# Patient Record
Sex: Female | Born: 1965
Health system: Southern US, Community
[De-identification: ages and names within clinical notes are randomized; demographics above are authoritative.]

## PROBLEM LIST (undated history)

## (undated) DIAGNOSIS — L0390 Cellulitis, unspecified: Secondary | ICD-10-CM

## (undated) DIAGNOSIS — I10 Essential (primary) hypertension: Secondary | ICD-10-CM

## (undated) DIAGNOSIS — M549 Dorsalgia, unspecified: Secondary | ICD-10-CM

## (undated) DIAGNOSIS — K219 Gastro-esophageal reflux disease without esophagitis: Secondary | ICD-10-CM

## (undated) DIAGNOSIS — E119 Type 2 diabetes mellitus without complications: Secondary | ICD-10-CM

## (undated) HISTORY — PX: THYROID SURGERY: SHX805

## (undated) HISTORY — PX: CHOLECYSTECTOMY: SHX55

## (undated) HISTORY — PX: ORIF ANKLE FRACTURE: SUR919

## (undated) HISTORY — PX: ENDOMETRIAL ABLATION: SHX621

---

## 2010-05-18 ENCOUNTER — Ambulatory Visit: Payer: Self-pay | Admitting: Cardiology

## 2016-01-16 DIAGNOSIS — B372 Candidiasis of skin and nail: Secondary | ICD-10-CM | POA: Diagnosis not present

## 2016-01-16 DIAGNOSIS — R3915 Urgency of urination: Secondary | ICD-10-CM | POA: Diagnosis not present

## 2016-01-16 DIAGNOSIS — N3946 Mixed incontinence: Secondary | ICD-10-CM | POA: Diagnosis not present

## 2016-01-16 DIAGNOSIS — N390 Urinary tract infection, site not specified: Secondary | ICD-10-CM | POA: Diagnosis not present

## 2016-02-02 DIAGNOSIS — Z6841 Body Mass Index (BMI) 40.0 and over, adult: Secondary | ICD-10-CM | POA: Diagnosis not present

## 2016-02-02 DIAGNOSIS — N3000 Acute cystitis without hematuria: Secondary | ICD-10-CM | POA: Diagnosis not present

## 2016-02-02 DIAGNOSIS — E1165 Type 2 diabetes mellitus with hyperglycemia: Secondary | ICD-10-CM | POA: Diagnosis not present

## 2016-02-02 DIAGNOSIS — I10 Essential (primary) hypertension: Secondary | ICD-10-CM | POA: Diagnosis not present

## 2016-05-17 DIAGNOSIS — Z6841 Body Mass Index (BMI) 40.0 and over, adult: Secondary | ICD-10-CM | POA: Diagnosis not present

## 2016-05-17 DIAGNOSIS — Z0000 Encounter for general adult medical examination without abnormal findings: Secondary | ICD-10-CM | POA: Diagnosis not present

## 2016-08-05 ENCOUNTER — Other Ambulatory Visit (HOSPITAL_COMMUNITY): Payer: Self-pay | Admitting: Internal Medicine

## 2016-08-05 DIAGNOSIS — Z1231 Encounter for screening mammogram for malignant neoplasm of breast: Secondary | ICD-10-CM

## 2016-08-23 ENCOUNTER — Ambulatory Visit (HOSPITAL_COMMUNITY): Payer: Self-pay

## 2016-09-06 DIAGNOSIS — Z6841 Body Mass Index (BMI) 40.0 and over, adult: Secondary | ICD-10-CM | POA: Diagnosis not present

## 2016-09-06 DIAGNOSIS — E1165 Type 2 diabetes mellitus with hyperglycemia: Secondary | ICD-10-CM | POA: Diagnosis not present

## 2016-09-06 DIAGNOSIS — I10 Essential (primary) hypertension: Secondary | ICD-10-CM | POA: Diagnosis not present

## 2016-09-13 ENCOUNTER — Ambulatory Visit (HOSPITAL_COMMUNITY)
Admission: RE | Admit: 2016-09-13 | Discharge: 2016-09-13 | Disposition: A | Discharge status: Home / Self Care | Payer: BLUE CROSS/BLUE SHIELD | Source: Ambulatory Visit | Attending: Internal Medicine | Admitting: Internal Medicine

## 2016-09-13 ENCOUNTER — Encounter (HOSPITAL_COMMUNITY): Payer: Self-pay | Admitting: Radiology

## 2016-09-13 DIAGNOSIS — Z1231 Encounter for screening mammogram for malignant neoplasm of breast: Secondary | ICD-10-CM | POA: Diagnosis not present

## 2016-10-30 DIAGNOSIS — E1165 Type 2 diabetes mellitus with hyperglycemia: Secondary | ICD-10-CM | POA: Diagnosis not present

## 2016-10-30 DIAGNOSIS — I10 Essential (primary) hypertension: Secondary | ICD-10-CM | POA: Diagnosis not present

## 2016-11-01 DIAGNOSIS — L02412 Cutaneous abscess of left axilla: Secondary | ICD-10-CM | POA: Diagnosis not present

## 2016-11-01 DIAGNOSIS — Z6841 Body Mass Index (BMI) 40.0 and over, adult: Secondary | ICD-10-CM | POA: Diagnosis not present

## 2016-11-01 DIAGNOSIS — I10 Essential (primary) hypertension: Secondary | ICD-10-CM | POA: Diagnosis not present

## 2016-12-13 DIAGNOSIS — E1165 Type 2 diabetes mellitus with hyperglycemia: Secondary | ICD-10-CM | POA: Diagnosis not present

## 2016-12-13 DIAGNOSIS — K219 Gastro-esophageal reflux disease without esophagitis: Secondary | ICD-10-CM | POA: Diagnosis not present

## 2016-12-13 DIAGNOSIS — I10 Essential (primary) hypertension: Secondary | ICD-10-CM | POA: Diagnosis not present

## 2016-12-13 DIAGNOSIS — Z6841 Body Mass Index (BMI) 40.0 and over, adult: Secondary | ICD-10-CM | POA: Diagnosis not present

## 2017-03-21 DIAGNOSIS — Z6841 Body Mass Index (BMI) 40.0 and over, adult: Secondary | ICD-10-CM | POA: Diagnosis not present

## 2017-03-21 DIAGNOSIS — I10 Essential (primary) hypertension: Secondary | ICD-10-CM | POA: Diagnosis not present

## 2017-03-21 DIAGNOSIS — K219 Gastro-esophageal reflux disease without esophagitis: Secondary | ICD-10-CM | POA: Diagnosis not present

## 2017-03-21 DIAGNOSIS — M545 Low back pain: Secondary | ICD-10-CM | POA: Diagnosis not present

## 2017-03-21 DIAGNOSIS — F411 Generalized anxiety disorder: Secondary | ICD-10-CM | POA: Diagnosis not present

## 2017-03-21 DIAGNOSIS — E1165 Type 2 diabetes mellitus with hyperglycemia: Secondary | ICD-10-CM | POA: Diagnosis not present

## 2017-04-28 DIAGNOSIS — E119 Type 2 diabetes mellitus without complications: Secondary | ICD-10-CM | POA: Diagnosis not present

## 2017-07-15 DIAGNOSIS — E1165 Type 2 diabetes mellitus with hyperglycemia: Secondary | ICD-10-CM | POA: Diagnosis not present

## 2017-07-15 DIAGNOSIS — M545 Low back pain: Secondary | ICD-10-CM | POA: Diagnosis not present

## 2017-07-15 DIAGNOSIS — K219 Gastro-esophageal reflux disease without esophagitis: Secondary | ICD-10-CM | POA: Diagnosis not present

## 2017-07-15 DIAGNOSIS — I10 Essential (primary) hypertension: Secondary | ICD-10-CM | POA: Diagnosis not present

## 2017-11-11 DIAGNOSIS — K219 Gastro-esophageal reflux disease without esophagitis: Secondary | ICD-10-CM | POA: Diagnosis not present

## 2017-11-11 DIAGNOSIS — I10 Essential (primary) hypertension: Secondary | ICD-10-CM | POA: Diagnosis not present

## 2017-11-11 DIAGNOSIS — Z6841 Body Mass Index (BMI) 40.0 and over, adult: Secondary | ICD-10-CM | POA: Diagnosis not present

## 2017-11-11 DIAGNOSIS — M545 Low back pain: Secondary | ICD-10-CM | POA: Diagnosis not present

## 2017-11-11 DIAGNOSIS — E1165 Type 2 diabetes mellitus with hyperglycemia: Secondary | ICD-10-CM | POA: Diagnosis not present

## 2017-12-01 DIAGNOSIS — M25561 Pain in right knee: Secondary | ICD-10-CM | POA: Diagnosis not present

## 2018-03-11 DIAGNOSIS — Z6841 Body Mass Index (BMI) 40.0 and over, adult: Secondary | ICD-10-CM | POA: Diagnosis not present

## 2018-03-11 DIAGNOSIS — Z0000 Encounter for general adult medical examination without abnormal findings: Secondary | ICD-10-CM | POA: Diagnosis not present

## 2018-04-16 DIAGNOSIS — M85851 Other specified disorders of bone density and structure, right thigh: Secondary | ICD-10-CM | POA: Diagnosis not present

## 2018-06-11 DIAGNOSIS — Z6841 Body Mass Index (BMI) 40.0 and over, adult: Secondary | ICD-10-CM | POA: Diagnosis not present

## 2018-06-11 DIAGNOSIS — E7849 Other hyperlipidemia: Secondary | ICD-10-CM | POA: Diagnosis not present

## 2018-06-11 DIAGNOSIS — I10 Essential (primary) hypertension: Secondary | ICD-10-CM | POA: Diagnosis not present

## 2018-06-11 DIAGNOSIS — E1142 Type 2 diabetes mellitus with diabetic polyneuropathy: Secondary | ICD-10-CM | POA: Diagnosis not present

## 2018-06-11 DIAGNOSIS — Z0000 Encounter for general adult medical examination without abnormal findings: Secondary | ICD-10-CM | POA: Diagnosis not present

## 2018-07-23 DIAGNOSIS — R300 Dysuria: Secondary | ICD-10-CM | POA: Diagnosis not present

## 2018-07-23 DIAGNOSIS — R319 Hematuria, unspecified: Secondary | ICD-10-CM | POA: Diagnosis not present

## 2018-07-23 DIAGNOSIS — Z6841 Body Mass Index (BMI) 40.0 and over, adult: Secondary | ICD-10-CM | POA: Diagnosis not present

## 2018-07-23 DIAGNOSIS — N390 Urinary tract infection, site not specified: Secondary | ICD-10-CM | POA: Diagnosis not present

## 2018-09-17 DIAGNOSIS — I10 Essential (primary) hypertension: Secondary | ICD-10-CM | POA: Diagnosis not present

## 2018-09-17 DIAGNOSIS — E1142 Type 2 diabetes mellitus with diabetic polyneuropathy: Secondary | ICD-10-CM | POA: Diagnosis not present

## 2018-09-17 DIAGNOSIS — E7849 Other hyperlipidemia: Secondary | ICD-10-CM | POA: Diagnosis not present

## 2018-09-17 DIAGNOSIS — Z6841 Body Mass Index (BMI) 40.0 and over, adult: Secondary | ICD-10-CM | POA: Diagnosis not present

## 2018-09-23 DIAGNOSIS — R079 Chest pain, unspecified: Secondary | ICD-10-CM | POA: Diagnosis not present

## 2018-12-31 DIAGNOSIS — Z6841 Body Mass Index (BMI) 40.0 and over, adult: Secondary | ICD-10-CM | POA: Diagnosis not present

## 2018-12-31 DIAGNOSIS — F330 Major depressive disorder, recurrent, mild: Secondary | ICD-10-CM | POA: Diagnosis not present

## 2018-12-31 DIAGNOSIS — I10 Essential (primary) hypertension: Secondary | ICD-10-CM | POA: Diagnosis not present

## 2018-12-31 DIAGNOSIS — M545 Low back pain: Secondary | ICD-10-CM | POA: Diagnosis not present

## 2018-12-31 DIAGNOSIS — E1142 Type 2 diabetes mellitus with diabetic polyneuropathy: Secondary | ICD-10-CM | POA: Diagnosis not present

## 2018-12-31 DIAGNOSIS — E7849 Other hyperlipidemia: Secondary | ICD-10-CM | POA: Diagnosis not present

## 2019-01-01 DIAGNOSIS — R079 Chest pain, unspecified: Secondary | ICD-10-CM | POA: Diagnosis not present

## 2019-03-31 DIAGNOSIS — R229 Localized swelling, mass and lump, unspecified: Secondary | ICD-10-CM | POA: Diagnosis not present

## 2019-03-31 DIAGNOSIS — Z6841 Body Mass Index (BMI) 40.0 and over, adult: Secondary | ICD-10-CM | POA: Diagnosis not present

## 2019-03-31 DIAGNOSIS — Z01419 Encounter for gynecological examination (general) (routine) without abnormal findings: Secondary | ICD-10-CM | POA: Diagnosis not present

## 2019-04-06 DIAGNOSIS — M545 Low back pain: Secondary | ICD-10-CM | POA: Diagnosis not present

## 2019-04-06 DIAGNOSIS — E7849 Other hyperlipidemia: Secondary | ICD-10-CM | POA: Diagnosis not present

## 2019-04-06 DIAGNOSIS — F330 Major depressive disorder, recurrent, mild: Secondary | ICD-10-CM | POA: Diagnosis not present

## 2019-04-06 DIAGNOSIS — I10 Essential (primary) hypertension: Secondary | ICD-10-CM | POA: Diagnosis not present

## 2019-04-06 DIAGNOSIS — E1142 Type 2 diabetes mellitus with diabetic polyneuropathy: Secondary | ICD-10-CM | POA: Diagnosis not present

## 2019-04-21 ENCOUNTER — Other Ambulatory Visit (HOSPITAL_COMMUNITY): Payer: Self-pay | Admitting: Internal Medicine

## 2019-04-21 DIAGNOSIS — Z1231 Encounter for screening mammogram for malignant neoplasm of breast: Secondary | ICD-10-CM

## 2019-04-27 ENCOUNTER — Encounter (INDEPENDENT_AMBULATORY_CARE_PROVIDER_SITE_OTHER): Payer: Self-pay | Admitting: *Deleted

## 2019-04-29 ENCOUNTER — Ambulatory Visit (HOSPITAL_COMMUNITY)
Admission: RE | Admit: 2019-04-29 | Discharge: 2019-04-29 | Disposition: A | Discharge status: Home / Self Care | Payer: BC Managed Care – PPO | Source: Ambulatory Visit | Attending: Internal Medicine | Admitting: Internal Medicine

## 2019-04-29 ENCOUNTER — Encounter (HOSPITAL_COMMUNITY): Payer: Self-pay

## 2019-04-29 ENCOUNTER — Other Ambulatory Visit: Payer: Self-pay

## 2019-04-29 DIAGNOSIS — Z1231 Encounter for screening mammogram for malignant neoplasm of breast: Secondary | ICD-10-CM | POA: Diagnosis not present

## 2019-07-12 DIAGNOSIS — L0291 Cutaneous abscess, unspecified: Secondary | ICD-10-CM | POA: Diagnosis not present

## 2019-07-12 DIAGNOSIS — Z0000 Encounter for general adult medical examination without abnormal findings: Secondary | ICD-10-CM | POA: Diagnosis not present

## 2019-07-12 DIAGNOSIS — Z6841 Body Mass Index (BMI) 40.0 and over, adult: Secondary | ICD-10-CM | POA: Diagnosis not present

## 2019-09-16 DIAGNOSIS — R2231 Localized swelling, mass and lump, right upper limb: Secondary | ICD-10-CM | POA: Diagnosis not present

## 2019-09-16 DIAGNOSIS — Z6841 Body Mass Index (BMI) 40.0 and over, adult: Secondary | ICD-10-CM | POA: Diagnosis not present

## 2019-09-27 DIAGNOSIS — L03314 Cellulitis of groin: Secondary | ICD-10-CM | POA: Diagnosis not present

## 2019-09-27 DIAGNOSIS — R1900 Intra-abdominal and pelvic swelling, mass and lump, unspecified site: Secondary | ICD-10-CM | POA: Diagnosis not present

## 2019-09-28 DIAGNOSIS — F329 Major depressive disorder, single episode, unspecified: Secondary | ICD-10-CM | POA: Diagnosis not present

## 2019-09-28 DIAGNOSIS — I10 Essential (primary) hypertension: Secondary | ICD-10-CM | POA: Diagnosis not present

## 2019-09-28 DIAGNOSIS — F419 Anxiety disorder, unspecified: Secondary | ICD-10-CM | POA: Diagnosis not present

## 2019-09-28 DIAGNOSIS — R7982 Elevated C-reactive protein (CRP): Secondary | ICD-10-CM | POA: Diagnosis not present

## 2019-09-28 DIAGNOSIS — R1900 Intra-abdominal and pelvic swelling, mass and lump, unspecified site: Secondary | ICD-10-CM | POA: Diagnosis not present

## 2019-09-28 DIAGNOSIS — R509 Fever, unspecified: Secondary | ICD-10-CM | POA: Diagnosis not present

## 2019-09-28 DIAGNOSIS — L0390 Cellulitis, unspecified: Secondary | ICD-10-CM | POA: Diagnosis not present

## 2019-09-28 DIAGNOSIS — Z7984 Long term (current) use of oral hypoglycemic drugs: Secondary | ICD-10-CM | POA: Diagnosis not present

## 2019-09-28 DIAGNOSIS — L89899 Pressure ulcer of other site, unspecified stage: Secondary | ICD-10-CM | POA: Diagnosis not present

## 2019-09-28 DIAGNOSIS — R000 Tachycardia, unspecified: Secondary | ICD-10-CM | POA: Diagnosis not present

## 2019-09-28 DIAGNOSIS — R1909 Other intra-abdominal and pelvic swelling, mass and lump: Secondary | ICD-10-CM | POA: Diagnosis not present

## 2019-09-28 DIAGNOSIS — Z9049 Acquired absence of other specified parts of digestive tract: Secondary | ICD-10-CM | POA: Diagnosis not present

## 2019-09-28 DIAGNOSIS — Z6841 Body Mass Index (BMI) 40.0 and over, adult: Secondary | ICD-10-CM | POA: Diagnosis not present

## 2019-09-28 DIAGNOSIS — K219 Gastro-esophageal reflux disease without esophagitis: Secondary | ICD-10-CM | POA: Diagnosis not present

## 2019-09-28 DIAGNOSIS — A419 Sepsis, unspecified organism: Secondary | ICD-10-CM | POA: Diagnosis not present

## 2019-09-28 DIAGNOSIS — L89159 Pressure ulcer of sacral region, unspecified stage: Secondary | ICD-10-CM | POA: Diagnosis not present

## 2019-09-28 DIAGNOSIS — Z79899 Other long term (current) drug therapy: Secondary | ICD-10-CM | POA: Diagnosis not present

## 2019-09-28 DIAGNOSIS — E1165 Type 2 diabetes mellitus with hyperglycemia: Secondary | ICD-10-CM | POA: Diagnosis not present

## 2019-09-28 DIAGNOSIS — E11628 Type 2 diabetes mellitus with other skin complications: Secondary | ICD-10-CM | POA: Diagnosis not present

## 2019-09-28 DIAGNOSIS — E119 Type 2 diabetes mellitus without complications: Secondary | ICD-10-CM | POA: Diagnosis not present

## 2019-09-28 DIAGNOSIS — L03115 Cellulitis of right lower limb: Secondary | ICD-10-CM | POA: Diagnosis not present

## 2019-09-28 DIAGNOSIS — L03314 Cellulitis of groin: Secondary | ICD-10-CM | POA: Diagnosis not present

## 2019-09-28 DIAGNOSIS — R600 Localized edema: Secondary | ICD-10-CM | POA: Diagnosis not present

## 2019-09-28 DIAGNOSIS — E785 Hyperlipidemia, unspecified: Secondary | ICD-10-CM | POA: Diagnosis not present

## 2019-10-06 DIAGNOSIS — R2231 Localized swelling, mass and lump, right upper limb: Secondary | ICD-10-CM | POA: Diagnosis not present

## 2019-10-06 DIAGNOSIS — Z6841 Body Mass Index (BMI) 40.0 and over, adult: Secondary | ICD-10-CM | POA: Diagnosis not present

## 2019-10-06 DIAGNOSIS — L0390 Cellulitis, unspecified: Secondary | ICD-10-CM | POA: Diagnosis not present

## 2019-10-07 DIAGNOSIS — E65 Localized adiposity: Secondary | ICD-10-CM | POA: Diagnosis not present

## 2019-10-07 DIAGNOSIS — L0390 Cellulitis, unspecified: Secondary | ICD-10-CM | POA: Diagnosis not present

## 2019-11-02 DIAGNOSIS — M793 Panniculitis, unspecified: Secondary | ICD-10-CM | POA: Diagnosis not present

## 2019-11-02 DIAGNOSIS — Z6841 Body Mass Index (BMI) 40.0 and over, adult: Secondary | ICD-10-CM | POA: Diagnosis not present

## 2019-11-05 DIAGNOSIS — M793 Panniculitis, unspecified: Secondary | ICD-10-CM | POA: Diagnosis not present

## 2019-11-08 DIAGNOSIS — M793 Panniculitis, unspecified: Secondary | ICD-10-CM | POA: Diagnosis not present

## 2019-11-15 DIAGNOSIS — M793 Panniculitis, unspecified: Secondary | ICD-10-CM | POA: Diagnosis not present

## 2019-11-22 DIAGNOSIS — M793 Panniculitis, unspecified: Secondary | ICD-10-CM | POA: Diagnosis not present

## 2019-12-14 DIAGNOSIS — M793 Panniculitis, unspecified: Secondary | ICD-10-CM | POA: Diagnosis not present

## 2019-12-21 DIAGNOSIS — L989 Disorder of the skin and subcutaneous tissue, unspecified: Secondary | ICD-10-CM | POA: Diagnosis not present

## 2019-12-21 DIAGNOSIS — D235 Other benign neoplasm of skin of trunk: Secondary | ICD-10-CM | POA: Diagnosis not present

## 2019-12-23 DIAGNOSIS — Z6841 Body Mass Index (BMI) 40.0 and over, adult: Secondary | ICD-10-CM | POA: Diagnosis not present

## 2019-12-23 DIAGNOSIS — M793 Panniculitis, unspecified: Secondary | ICD-10-CM | POA: Diagnosis not present

## 2020-01-05 DIAGNOSIS — M793 Panniculitis, unspecified: Secondary | ICD-10-CM | POA: Diagnosis not present

## 2020-01-31 DIAGNOSIS — M793 Panniculitis, unspecified: Secondary | ICD-10-CM | POA: Diagnosis not present

## 2020-02-01 DIAGNOSIS — M793 Panniculitis, unspecified: Secondary | ICD-10-CM | POA: Diagnosis not present

## 2020-02-03 DIAGNOSIS — M793 Panniculitis, unspecified: Secondary | ICD-10-CM | POA: Diagnosis not present

## 2020-02-28 DIAGNOSIS — M793 Panniculitis, unspecified: Secondary | ICD-10-CM | POA: Diagnosis not present

## 2020-03-31 DIAGNOSIS — Z79899 Other long term (current) drug therapy: Secondary | ICD-10-CM | POA: Diagnosis not present

## 2020-03-31 DIAGNOSIS — E119 Type 2 diabetes mellitus without complications: Secondary | ICD-10-CM | POA: Diagnosis not present

## 2020-03-31 DIAGNOSIS — Z9049 Acquired absence of other specified parts of digestive tract: Secondary | ICD-10-CM | POA: Diagnosis not present

## 2020-03-31 DIAGNOSIS — K219 Gastro-esophageal reflux disease without esophagitis: Secondary | ICD-10-CM | POA: Diagnosis not present

## 2020-03-31 DIAGNOSIS — E785 Hyperlipidemia, unspecified: Secondary | ICD-10-CM | POA: Diagnosis not present

## 2020-03-31 DIAGNOSIS — Z7984 Long term (current) use of oral hypoglycemic drugs: Secondary | ICD-10-CM | POA: Diagnosis not present

## 2020-03-31 DIAGNOSIS — F419 Anxiety disorder, unspecified: Secondary | ICD-10-CM | POA: Diagnosis not present

## 2020-03-31 DIAGNOSIS — L89899 Pressure ulcer of other site, unspecified stage: Secondary | ICD-10-CM | POA: Diagnosis not present

## 2020-03-31 DIAGNOSIS — F329 Major depressive disorder, single episode, unspecified: Secondary | ICD-10-CM | POA: Diagnosis not present

## 2020-04-01 DIAGNOSIS — M25562 Pain in left knee: Secondary | ICD-10-CM | POA: Diagnosis not present

## 2020-04-04 DIAGNOSIS — I10 Essential (primary) hypertension: Secondary | ICD-10-CM | POA: Diagnosis not present

## 2020-04-04 DIAGNOSIS — E1165 Type 2 diabetes mellitus with hyperglycemia: Secondary | ICD-10-CM | POA: Diagnosis not present

## 2020-04-04 DIAGNOSIS — E7849 Other hyperlipidemia: Secondary | ICD-10-CM | POA: Diagnosis not present

## 2020-04-04 DIAGNOSIS — E119 Type 2 diabetes mellitus without complications: Secondary | ICD-10-CM | POA: Diagnosis not present

## 2020-04-04 DIAGNOSIS — M793 Panniculitis, unspecified: Secondary | ICD-10-CM | POA: Diagnosis not present

## 2020-04-07 DIAGNOSIS — M793 Panniculitis, unspecified: Secondary | ICD-10-CM | POA: Diagnosis not present

## 2020-04-22 DIAGNOSIS — R0602 Shortness of breath: Secondary | ICD-10-CM | POA: Diagnosis not present

## 2020-04-22 DIAGNOSIS — R079 Chest pain, unspecified: Secondary | ICD-10-CM | POA: Diagnosis not present

## 2020-04-22 DIAGNOSIS — F419 Anxiety disorder, unspecified: Secondary | ICD-10-CM | POA: Diagnosis not present

## 2020-04-22 DIAGNOSIS — R0789 Other chest pain: Secondary | ICD-10-CM | POA: Diagnosis not present

## 2020-05-02 DIAGNOSIS — J029 Acute pharyngitis, unspecified: Secondary | ICD-10-CM | POA: Diagnosis not present

## 2020-05-02 DIAGNOSIS — Z6841 Body Mass Index (BMI) 40.0 and over, adult: Secondary | ICD-10-CM | POA: Diagnosis not present

## 2020-07-05 DIAGNOSIS — E1143 Type 2 diabetes mellitus with diabetic autonomic (poly)neuropathy: Secondary | ICD-10-CM | POA: Diagnosis not present

## 2020-07-05 DIAGNOSIS — M793 Panniculitis, unspecified: Secondary | ICD-10-CM | POA: Diagnosis not present

## 2020-07-05 DIAGNOSIS — I10 Essential (primary) hypertension: Secondary | ICD-10-CM | POA: Diagnosis not present

## 2020-07-05 DIAGNOSIS — E0843 Diabetes mellitus due to underlying condition with diabetic autonomic (poly)neuropathy: Secondary | ICD-10-CM | POA: Diagnosis not present

## 2020-07-05 DIAGNOSIS — E7849 Other hyperlipidemia: Secondary | ICD-10-CM | POA: Diagnosis not present

## 2020-07-07 DIAGNOSIS — F419 Anxiety disorder, unspecified: Secondary | ICD-10-CM | POA: Diagnosis not present

## 2020-07-07 DIAGNOSIS — M793 Panniculitis, unspecified: Secondary | ICD-10-CM | POA: Diagnosis not present

## 2020-07-07 DIAGNOSIS — Z7984 Long term (current) use of oral hypoglycemic drugs: Secondary | ICD-10-CM | POA: Diagnosis not present

## 2020-07-07 DIAGNOSIS — Z9049 Acquired absence of other specified parts of digestive tract: Secondary | ICD-10-CM | POA: Diagnosis not present

## 2020-07-07 DIAGNOSIS — E119 Type 2 diabetes mellitus without complications: Secondary | ICD-10-CM | POA: Diagnosis not present

## 2020-07-07 DIAGNOSIS — Z792 Long term (current) use of antibiotics: Secondary | ICD-10-CM | POA: Diagnosis not present

## 2020-07-07 DIAGNOSIS — L89899 Pressure ulcer of other site, unspecified stage: Secondary | ICD-10-CM | POA: Diagnosis not present

## 2020-07-11 DIAGNOSIS — M793 Panniculitis, unspecified: Secondary | ICD-10-CM | POA: Diagnosis not present

## 2020-07-12 ENCOUNTER — Other Ambulatory Visit: Payer: Self-pay | Admitting: Internal Medicine

## 2020-07-13 ENCOUNTER — Other Ambulatory Visit: Payer: Self-pay | Admitting: Internal Medicine

## 2020-07-13 DIAGNOSIS — M793 Panniculitis, unspecified: Secondary | ICD-10-CM

## 2020-07-28 DIAGNOSIS — L89899 Pressure ulcer of other site, unspecified stage: Secondary | ICD-10-CM | POA: Diagnosis not present

## 2020-07-28 DIAGNOSIS — M793 Panniculitis, unspecified: Secondary | ICD-10-CM | POA: Diagnosis not present

## 2020-07-31 ENCOUNTER — Ambulatory Visit
Admission: RE | Admit: 2020-07-31 | Discharge: 2020-07-31 | Disposition: A | Discharge status: Home / Self Care | Payer: BC Managed Care – PPO | Source: Ambulatory Visit | Attending: Internal Medicine | Admitting: Internal Medicine

## 2020-07-31 ENCOUNTER — Other Ambulatory Visit: Payer: Self-pay

## 2020-07-31 DIAGNOSIS — M793 Panniculitis, unspecified: Secondary | ICD-10-CM

## 2020-07-31 DIAGNOSIS — D1723 Benign lipomatous neoplasm of skin and subcutaneous tissue of right leg: Secondary | ICD-10-CM | POA: Diagnosis not present

## 2020-07-31 DIAGNOSIS — R2241 Localized swelling, mass and lump, right lower limb: Secondary | ICD-10-CM | POA: Diagnosis not present

## 2020-08-08 DIAGNOSIS — Z6841 Body Mass Index (BMI) 40.0 and over, adult: Secondary | ICD-10-CM | POA: Diagnosis not present

## 2020-08-08 DIAGNOSIS — L0390 Cellulitis, unspecified: Secondary | ICD-10-CM | POA: Diagnosis not present

## 2020-08-18 DIAGNOSIS — Z9049 Acquired absence of other specified parts of digestive tract: Secondary | ICD-10-CM | POA: Diagnosis not present

## 2020-08-18 DIAGNOSIS — Z7984 Long term (current) use of oral hypoglycemic drugs: Secondary | ICD-10-CM | POA: Diagnosis not present

## 2020-08-18 DIAGNOSIS — E119 Type 2 diabetes mellitus without complications: Secondary | ICD-10-CM | POA: Diagnosis not present

## 2020-08-18 DIAGNOSIS — Z792 Long term (current) use of antibiotics: Secondary | ICD-10-CM | POA: Diagnosis not present

## 2020-08-18 DIAGNOSIS — L89892 Pressure ulcer of other site, stage 2: Secondary | ICD-10-CM | POA: Diagnosis not present

## 2020-09-07 DIAGNOSIS — M793 Panniculitis, unspecified: Secondary | ICD-10-CM | POA: Diagnosis not present

## 2020-09-22 DIAGNOSIS — L89899 Pressure ulcer of other site, unspecified stage: Secondary | ICD-10-CM | POA: Diagnosis not present

## 2020-09-22 DIAGNOSIS — M793 Panniculitis, unspecified: Secondary | ICD-10-CM | POA: Diagnosis not present

## 2020-10-05 DIAGNOSIS — E7849 Other hyperlipidemia: Secondary | ICD-10-CM | POA: Diagnosis not present

## 2020-10-05 DIAGNOSIS — L0390 Cellulitis, unspecified: Secondary | ICD-10-CM | POA: Diagnosis not present

## 2020-10-05 DIAGNOSIS — I10 Essential (primary) hypertension: Secondary | ICD-10-CM | POA: Diagnosis not present

## 2020-10-05 DIAGNOSIS — E1143 Type 2 diabetes mellitus with diabetic autonomic (poly)neuropathy: Secondary | ICD-10-CM | POA: Diagnosis not present

## 2020-10-20 DIAGNOSIS — M793 Panniculitis, unspecified: Secondary | ICD-10-CM | POA: Diagnosis not present

## 2020-10-20 DIAGNOSIS — L89892 Pressure ulcer of other site, stage 2: Secondary | ICD-10-CM | POA: Diagnosis not present

## 2020-10-30 DIAGNOSIS — M793 Panniculitis, unspecified: Secondary | ICD-10-CM | POA: Diagnosis not present

## 2020-10-30 DIAGNOSIS — S31109A Unspecified open wound of abdominal wall, unspecified quadrant without penetration into peritoneal cavity, initial encounter: Secondary | ICD-10-CM | POA: Diagnosis not present

## 2020-11-10 DIAGNOSIS — L89892 Pressure ulcer of other site, stage 2: Secondary | ICD-10-CM | POA: Diagnosis not present

## 2020-11-10 DIAGNOSIS — S31109A Unspecified open wound of abdominal wall, unspecified quadrant without penetration into peritoneal cavity, initial encounter: Secondary | ICD-10-CM | POA: Diagnosis not present

## 2020-11-10 DIAGNOSIS — M793 Panniculitis, unspecified: Secondary | ICD-10-CM | POA: Diagnosis not present

## 2020-12-07 DIAGNOSIS — M79672 Pain in left foot: Secondary | ICD-10-CM | POA: Diagnosis not present

## 2020-12-07 DIAGNOSIS — M722 Plantar fascial fibromatosis: Secondary | ICD-10-CM | POA: Diagnosis not present

## 2020-12-07 DIAGNOSIS — M7732 Calcaneal spur, left foot: Secondary | ICD-10-CM | POA: Diagnosis not present

## 2020-12-07 DIAGNOSIS — M25579 Pain in unspecified ankle and joints of unspecified foot: Secondary | ICD-10-CM | POA: Diagnosis not present

## 2020-12-26 DIAGNOSIS — L03115 Cellulitis of right lower limb: Secondary | ICD-10-CM | POA: Diagnosis not present

## 2020-12-26 DIAGNOSIS — L89892 Pressure ulcer of other site, stage 2: Secondary | ICD-10-CM | POA: Diagnosis not present

## 2020-12-28 DIAGNOSIS — S31109A Unspecified open wound of abdominal wall, unspecified quadrant without penetration into peritoneal cavity, initial encounter: Secondary | ICD-10-CM | POA: Diagnosis not present

## 2021-01-01 DIAGNOSIS — M793 Panniculitis, unspecified: Secondary | ICD-10-CM | POA: Diagnosis not present

## 2021-01-01 DIAGNOSIS — Z6841 Body Mass Index (BMI) 40.0 and over, adult: Secondary | ICD-10-CM | POA: Diagnosis not present

## 2021-01-04 DIAGNOSIS — M79672 Pain in left foot: Secondary | ICD-10-CM | POA: Diagnosis not present

## 2021-01-04 DIAGNOSIS — M722 Plantar fascial fibromatosis: Secondary | ICD-10-CM | POA: Diagnosis not present

## 2021-01-04 DIAGNOSIS — M79671 Pain in right foot: Secondary | ICD-10-CM | POA: Diagnosis not present

## 2021-01-09 DIAGNOSIS — L89892 Pressure ulcer of other site, stage 2: Secondary | ICD-10-CM | POA: Diagnosis not present

## 2021-01-09 DIAGNOSIS — L03115 Cellulitis of right lower limb: Secondary | ICD-10-CM | POA: Diagnosis not present

## 2021-01-09 DIAGNOSIS — M793 Panniculitis, unspecified: Secondary | ICD-10-CM | POA: Diagnosis not present

## 2021-01-09 DIAGNOSIS — M19071 Primary osteoarthritis, right ankle and foot: Secondary | ICD-10-CM | POA: Diagnosis not present

## 2021-01-09 DIAGNOSIS — M7989 Other specified soft tissue disorders: Secondary | ICD-10-CM | POA: Diagnosis not present

## 2021-01-09 DIAGNOSIS — M7731 Calcaneal spur, right foot: Secondary | ICD-10-CM | POA: Diagnosis not present

## 2021-01-10 DIAGNOSIS — E1143 Type 2 diabetes mellitus with diabetic autonomic (poly)neuropathy: Secondary | ICD-10-CM | POA: Diagnosis not present

## 2021-01-10 DIAGNOSIS — Z6841 Body Mass Index (BMI) 40.0 and over, adult: Secondary | ICD-10-CM | POA: Diagnosis not present

## 2021-01-10 DIAGNOSIS — Z0000 Encounter for general adult medical examination without abnormal findings: Secondary | ICD-10-CM | POA: Diagnosis not present

## 2021-01-10 DIAGNOSIS — L0390 Cellulitis, unspecified: Secondary | ICD-10-CM | POA: Diagnosis not present

## 2021-01-10 DIAGNOSIS — E7849 Other hyperlipidemia: Secondary | ICD-10-CM | POA: Diagnosis not present

## 2021-01-10 DIAGNOSIS — I10 Essential (primary) hypertension: Secondary | ICD-10-CM | POA: Diagnosis not present

## 2021-01-11 DIAGNOSIS — L97811 Non-pressure chronic ulcer of other part of right lower leg limited to breakdown of skin: Secondary | ICD-10-CM | POA: Diagnosis not present

## 2021-01-11 DIAGNOSIS — S31109A Unspecified open wound of abdominal wall, unspecified quadrant without penetration into peritoneal cavity, initial encounter: Secondary | ICD-10-CM | POA: Diagnosis not present

## 2021-01-19 DIAGNOSIS — S31109A Unspecified open wound of abdominal wall, unspecified quadrant without penetration into peritoneal cavity, initial encounter: Secondary | ICD-10-CM | POA: Diagnosis not present

## 2021-01-19 DIAGNOSIS — L97211 Non-pressure chronic ulcer of right calf limited to breakdown of skin: Secondary | ICD-10-CM | POA: Diagnosis not present

## 2021-01-25 ENCOUNTER — Other Ambulatory Visit: Payer: Self-pay

## 2021-01-25 ENCOUNTER — Encounter (HOSPITAL_COMMUNITY): Payer: Self-pay | Admitting: *Deleted

## 2021-01-25 ENCOUNTER — Inpatient Hospital Stay (HOSPITAL_COMMUNITY)
Admission: EM | Admit: 2021-01-25 | Discharge: 2021-01-28 | DRG: 638 | Disposition: A | Discharge status: Home Health | Payer: BC Managed Care – PPO | Attending: Internal Medicine | Admitting: Internal Medicine

## 2021-01-25 ENCOUNTER — Emergency Department (HOSPITAL_COMMUNITY): Payer: BC Managed Care – PPO

## 2021-01-25 DIAGNOSIS — E1165 Type 2 diabetes mellitus with hyperglycemia: Secondary | ICD-10-CM | POA: Diagnosis not present

## 2021-01-25 DIAGNOSIS — I872 Venous insufficiency (chronic) (peripheral): Secondary | ICD-10-CM | POA: Diagnosis present

## 2021-01-25 DIAGNOSIS — M869 Osteomyelitis, unspecified: Secondary | ICD-10-CM

## 2021-01-25 DIAGNOSIS — T148XXA Other injury of unspecified body region, initial encounter: Secondary | ICD-10-CM

## 2021-01-25 DIAGNOSIS — L03119 Cellulitis of unspecified part of limb: Secondary | ICD-10-CM | POA: Diagnosis not present

## 2021-01-25 DIAGNOSIS — E1169 Type 2 diabetes mellitus with other specified complication: Secondary | ICD-10-CM | POA: Diagnosis not present

## 2021-01-25 DIAGNOSIS — Z6841 Body Mass Index (BMI) 40.0 and over, adult: Secondary | ICD-10-CM

## 2021-01-25 DIAGNOSIS — K219 Gastro-esophageal reflux disease without esophagitis: Secondary | ICD-10-CM

## 2021-01-25 DIAGNOSIS — Z20822 Contact with and (suspected) exposure to covid-19: Secondary | ICD-10-CM | POA: Diagnosis not present

## 2021-01-25 DIAGNOSIS — M549 Dorsalgia, unspecified: Secondary | ICD-10-CM | POA: Diagnosis present

## 2021-01-25 DIAGNOSIS — Z79899 Other long term (current) drug therapy: Secondary | ICD-10-CM | POA: Diagnosis not present

## 2021-01-25 DIAGNOSIS — Z79891 Long term (current) use of opiate analgesic: Secondary | ICD-10-CM

## 2021-01-25 DIAGNOSIS — G8929 Other chronic pain: Secondary | ICD-10-CM | POA: Diagnosis present

## 2021-01-25 DIAGNOSIS — M86171 Other acute osteomyelitis, right ankle and foot: Secondary | ICD-10-CM | POA: Diagnosis not present

## 2021-01-25 DIAGNOSIS — M7731 Calcaneal spur, right foot: Secondary | ICD-10-CM | POA: Diagnosis not present

## 2021-01-25 DIAGNOSIS — Z7984 Long term (current) use of oral hypoglycemic drugs: Secondary | ICD-10-CM

## 2021-01-25 DIAGNOSIS — M7989 Other specified soft tissue disorders: Secondary | ICD-10-CM | POA: Diagnosis not present

## 2021-01-25 DIAGNOSIS — F32A Depression, unspecified: Secondary | ICD-10-CM | POA: Diagnosis not present

## 2021-01-25 DIAGNOSIS — F419 Anxiety disorder, unspecified: Secondary | ICD-10-CM | POA: Diagnosis present

## 2021-01-25 DIAGNOSIS — E11621 Type 2 diabetes mellitus with foot ulcer: Secondary | ICD-10-CM | POA: Diagnosis not present

## 2021-01-25 DIAGNOSIS — E119 Type 2 diabetes mellitus without complications: Secondary | ICD-10-CM | POA: Diagnosis not present

## 2021-01-25 DIAGNOSIS — I10 Essential (primary) hypertension: Secondary | ICD-10-CM | POA: Diagnosis not present

## 2021-01-25 DIAGNOSIS — L97519 Non-pressure chronic ulcer of other part of right foot with unspecified severity: Secondary | ICD-10-CM | POA: Diagnosis not present

## 2021-01-25 DIAGNOSIS — L89610 Pressure ulcer of right heel, unstageable: Secondary | ICD-10-CM | POA: Diagnosis not present

## 2021-01-25 DIAGNOSIS — E11628 Type 2 diabetes mellitus with other skin complications: Secondary | ICD-10-CM | POA: Diagnosis not present

## 2021-01-25 DIAGNOSIS — R600 Localized edema: Secondary | ICD-10-CM | POA: Diagnosis not present

## 2021-01-25 DIAGNOSIS — L03115 Cellulitis of right lower limb: Secondary | ICD-10-CM

## 2021-01-25 DIAGNOSIS — L089 Local infection of the skin and subcutaneous tissue, unspecified: Secondary | ICD-10-CM | POA: Diagnosis not present

## 2021-01-25 DIAGNOSIS — L0390 Cellulitis, unspecified: Secondary | ICD-10-CM

## 2021-01-25 HISTORY — DX: Essential (primary) hypertension: I10

## 2021-01-25 HISTORY — DX: Dorsalgia, unspecified: M54.9

## 2021-01-25 HISTORY — DX: Gastro-esophageal reflux disease without esophagitis: K21.9

## 2021-01-25 HISTORY — DX: Cellulitis, unspecified: L03.90

## 2021-01-25 HISTORY — DX: Type 2 diabetes mellitus without complications: E11.9

## 2021-01-25 LAB — COMPREHENSIVE METABOLIC PANEL
ALT: 15 U/L (ref 0–44)
AST: 14 U/L — ABNORMAL LOW (ref 15–41)
Albumin: 3.6 g/dL (ref 3.5–5.0)
Alkaline Phosphatase: 88 U/L (ref 38–126)
Anion gap: 9 (ref 5–15)
BUN: 17 mg/dL (ref 6–20)
CO2: 26 mmol/L (ref 22–32)
Calcium: 9 mg/dL (ref 8.9–10.3)
Chloride: 100 mmol/L (ref 98–111)
Creatinine, Ser: 0.42 mg/dL — ABNORMAL LOW (ref 0.44–1.00)
GFR, Estimated: >60 mL/min (ref >60)
Glucose, Bld: 160 mg/dL — ABNORMAL HIGH (ref 70–99)
Potassium: 4.5 mmol/L (ref 3.5–5.1)
Sodium: 135 mmol/L (ref 135–145)
Total Bilirubin: 0.7 mg/dL (ref 0.3–1.2)
Total Protein: 7.8 g/dL (ref 6.5–8.1)

## 2021-01-25 LAB — CBC WITH DIFFERENTIAL/PLATELET
Abs Immature Granulocytes: 0.07 10*3/uL (ref 0.00–0.07)
Basophils Absolute: 0.1 10*3/uL (ref 0.0–0.1)
Basophils Relative: 1 %
Eosinophils Absolute: 0.1 10*3/uL (ref 0.0–0.5)
Eosinophils Relative: 1 %
HCT: 41.1 % (ref 36.0–46.0)
Hemoglobin: 13 g/dL (ref 12.0–15.0)
Immature Granulocytes: 1 %
Lymphocytes Relative: 10 %
Lymphs Abs: 1.1 10*3/uL (ref 0.7–4.0)
MCH: 27.8 pg (ref 26.0–34.0)
MCHC: 31.6 g/dL (ref 30.0–36.0)
MCV: 88 fL (ref 80.0–100.0)
Monocytes Absolute: 1.1 10*3/uL — ABNORMAL HIGH (ref 0.1–1.0)
Monocytes Relative: 11 %
Neutro Abs: 8.1 10*3/uL — ABNORMAL HIGH (ref 1.7–7.7)
Neutrophils Relative %: 76 %
Platelets: 399 10*3/uL (ref 150–400)
RBC: 4.67 MIL/uL (ref 3.87–5.11)
RDW: 13.6 % (ref 11.5–15.5)
WBC: 10.5 10*3/uL (ref 4.0–10.5)
nRBC: 0 % (ref 0.0–0.2)

## 2021-01-25 LAB — RESP PANEL BY RT-PCR (FLU A&B, COVID) ARPGX2
Influenza A by PCR: NEGATIVE
Influenza B by PCR: NEGATIVE
SARS Coronavirus 2 by RT PCR: NEGATIVE

## 2021-01-25 LAB — LACTIC ACID, PLASMA
Lactic Acid, Venous: 1.3 mmol/L (ref 0.5–1.9)
Lactic Acid, Venous: 0.8 mmol/L (ref 0.5–1.9)

## 2021-01-25 MED ORDER — VANCOMYCIN HCL IN DEXTROSE 1-5 GM/200ML-% IV SOLN
1000.0000 mg | Freq: Once | INTRAVENOUS | Status: AC
  Administered 2021-01-25: 1000 mg via INTRAVENOUS
  Filled 2021-01-25: qty 200

## 2021-01-25 MED ORDER — PIPERACILLIN-TAZOBACTAM 3.375 G IVPB 30 MIN
3.3750 g | Freq: Once | INTRAVENOUS | Status: AC
  Administered 2021-01-25: 3.375 g via INTRAVENOUS
  Filled 2021-01-25: qty 50

## 2021-01-25 NOTE — H&P (Incomplete)
History and Physical  [name] RJJ:[id number] DOB: 1966-09-17 DOA: 01/25/2021  Referring physician: Legrand Rams PCP: Toma Deiters, MD  Patient coming from: Home  Chief Complaint: Right foot pain and drainage  HPI: [name] is a 55 y.o. female with medical history significant for T2DM, hypertension, GERD pain and morbid obesity (BMI 61) who presents to the emergency department accompanied by son due to 90-month history of of right foot pain.  Patient states that she had plantar fasciitis in the past which resolved with a steroid shot given by her doctor, she complains of a recurrence of plantar fasciitis in the right foot, she went to her physician and a steroid shot was given, unfortunately after this, she developed a wound at the injection site and she has been following [facility] healthcare wound healing center at [facility], she was also diagnosed with cellulitis of the right leg and she was started on Augmentin, on completing Augmentin, she was placed on 14-day treatment of doxycycline due to the cellulitis as well as to be wound on right heel.  Patient also developed a second toe infection (around April 5), she continued to follow with the wound center, second round of doxycycline was started after completing the first round.  She now complains of foul-smelling drainage from the wound which started a few days ago, patient was then asked to go to the ED for further evaluation and management. On reviewing patient's medical record from his everywhere, it was noted that she was seen for panniculitis at [facility] plastic and reconstructive surgery at [facility] on 01/01/2021 due to right thigh wound during which patient was noted to have a right medial thigh lesion with associated nonhealing wound and abdominal panniculus.  ED Course:  In the emergency department, she was hemodynamically stable.  Work-up in the ED showed normal CBC and BMP except for hyperglycemia, lactic acid was 1.3 > 0.8.  Influenza  A, B and SARS coronavirus 2 was negative. Right foot x-ray showed no evidence of osteomyelitis within the foot but showed soft tissue swelling in the lower calf/ankle. She was empirically started on IV vancomycin and Zosyn.  Hospitalist was asked to admit patient for further evaluation and management.  Review of Systems: Constitutional: Negative for chills and fever.  HENT: Negative for ear pain and sore throat.   Eyes: Negative for pain and visual disturbance.  Respiratory: Negative for cough, chest tightness and shortness of breath.   Cardiovascular: Negative for chest pain and palpitations.  Gastrointestinal: Negative for abdominal pain and vomiting.  Endocrine: Negative for polyphagia and polyuria.  Genitourinary: Negative for decreased urine volume, dysuria, enuresis Musculoskeletal: Positive for right heel and right second toe pain and drainage. Skin: Positive for right leg rash and redness.    Allergic/Immunologic: Negative for immunocompromised state.  Neurological: Negative for tremors, syncope, speech difficulty, weakness, light-headedness and headaches.  Hematological: Does not bruise/bleed easily.  All other systems reviewed and are negative   Past Medical History:  Diagnosis Date  . Back pain   . Chronic GERD   . Diabetes mellitus without complication (HCC)   . Hypertension    *** The histories are not reviewed yet. Please review them in the "History" navigator section and refresh this SmartLink.  Social History:  has no history on file for tobacco use, alcohol use, and drug use.   No Known Allergies  No family history on file.  ***  Prior to Admission medications   Medication Sig Start Date End Date Taking?  Authorizing Provider  ARIPiprazole (ABILIFY) 5 MG tablet Take 5 mg by mouth daily. 03/18/19  Yes [provider]  doxycycline (VIBRA-TABS) 100 MG tablet Take 100 mg by mouth 2 (two) times daily. 01/10/21  Yes [provider]  escitalopram  (LEXAPRO) 20 MG tablet Take 1 tablet by mouth daily. 11/03/20  Yes [provider]  HYDROcodone-acetaminophen (NORCO) 10-325 MG tablet Take 1 tablet by mouth 3 (three) times daily. 01/11/21  Yes [provider]  JARDIANCE 25 MG TABS tablet Take 25 mg by mouth daily. 12/30/20  Yes [provider]  ketoconazole (NIZORAL) 2 % shampoo Apply 1 application topically 2 (two) times a week. 12/31/18  Yes [provider]  lisinopril (ZESTRIL) 20 MG tablet Take 1 tablet by mouth daily. 10/13/20  Yes [provider]  Multiple Vitamin (MULTI-VITAMIN) tablet Take 1 tablet by mouth daily.   Yes [provider]  nystatin (MYCOSTATIN/NYSTOP) powder Apply topically 2 (two) times daily. to affected area(s) 08/08/20  Yes [provider]  Omega-3 1000 MG CAPS Take 2 capsules by mouth daily.   Yes [provider]  pantoprazole (PROTONIX) 40 MG tablet Take 1 tablet by mouth daily. 12/05/20  Yes [provider]  sitaGLIPtin-metformin (JANUMET) 50-500 MG tablet Take 1 tablet by mouth 2 (two) times daily with a meal.   Yes [provider]  triamcinolone lotion (KENALOG) 0.1 % Apply 1 application topically daily.   Yes [provider]    Physical Exam: BP 111/65 (BP Location: Left Arm)   Pulse 85   Temp 99.4 F (37.4 C) (Oral)   Resp 16   Ht 5\' 5"  (1.651 m)   SpO2 100%   . General: 55 y.o. year-old female morbidly obese and not in no acute distress.  Alert and oriented x3. 40 HEENT, NCAT, EOMI . Neck: Supple, trachea medial . Cardiovascular: Regular rate and rhythm with no rubs or gallops.  No thyromegaly or JVD noted.  No lower extremity edema. 2/4 pulses in all 4 extremities. Marland Kitchen Respiratory: Clear to auscultation with no wheezes or rales. Good inspiratory effort. . Abdomen: Soft nontender nondistended with normal bowel sounds x4 quadrants. . Muskuloskeletal: No cyanosis, clubbing or edema noted bilaterally . Neuro: CN II-XII  intact, strength, sensation, reflexes . Skin: No ulcerative lesions noted or rashes . Psychiatry: Judgement and insight appear normal. Mood is appropriate for condition and setting          Labs on Admission:  Basic Metabolic Panel: Recent Labs  Lab 01/25/21 1740  NA 135  K 4.5  CL 100  CO2 26  GLUCOSE 160*  BUN 17  CREATININE 0.42*  CALCIUM 9.0   Liver Function Tests: Recent Labs  Lab 01/25/21 1740  AST 14*  ALT 15  ALKPHOS 88  BILITOT 0.7  PROT 7.8  ALBUMIN 3.6   No results for input(s): LIPASE, AMYLASE in the last 168 hours. No results for input(s): AMMONIA in the last 168 hours. CBC: Recent Labs  Lab 01/25/21 1740  WBC 10.5  NEUTROABS 8.1*  HGB 13.0  HCT 41.1  MCV 88.0  PLT 399   Cardiac Enzymes: No results for input(s): CKTOTAL, CKMB, CKMBINDEX, TROPONINI in the last 168 hours.  BNP (last 3 results) No results for input(s): BNP in the last 8760 hours.  ProBNP (last 3 results) No results for input(s): PROBNP in the last 8760 hours.  CBG: No results for input(s): GLUCAP in the last 168 hours.  Radiological Exams on Admission: DG Foot Complete  Right  Result Date: 01/25/2021 CLINICAL DATA:  Foot pain, drainage.  Question osteomyelitis. EXAM: RIGHT FOOT COMPLETE - 3+ VIEW COMPARISON:  None. FINDINGS: Hardware noted in the distal tibia and fibula. Marked soft tissue swelling in the distal calf and ankle region. No acute bony abnormality. Specifically, no fracture, subluxation, or dislocation. Plantar calcaneal spur. No bone destruction to suggest osteomyelitis. IMPRESSION: No evidence of osteomyelitis within the foot. Soft tissue swelling in the lower calf/ankle. Electronically Signed   By: Charlett Nose M.D.   On: 01/25/2021 17:33    EKG: I independently viewed the EKG done and my findings are as followed: ***   Assessment/Plan Present on Admission: . Wound infection  Active Problems:   Wound infection   DVT prophylaxis: ***   Code Status: ***    Family Communication: ***   Disposition Plan: ***   Consults called: ***   Admission status: ***     Frankey Shown MD [facility] Pager [phone]  If 7PM-7AM, please contact night-coverage [email or web address] Password [facility]  01/25/2021, 8:33 PM

## 2021-01-25 NOTE — H&P (Signed)
History and Physical  [name] NTI:[id number] DOB: 21-Jun-1966 DOA: 01/25/2021  Referring physician: Legrand Rams PCP: Toma Deiters, MD  Patient coming from: Home  Chief Complaint: Right foot pain and drainage  HPI: [name] is a 55 y.o. female with medical history significant for T2DM, hypertension, GERD pain and morbid obesity (BMI 61) who presents to the emergency department accompanied by son due to 53-month history of of right foot pain.  Patient states that she had plantar fasciitis in the past which resolved with a steroid shot given by her doctor, she complained of a recurrence of plantar fasciitis in the right foot a few months ago, she went to her physician and a steroid shot was given, unfortunately after this, she developed a wound at the injection site and she has been following [facility] healthcare wound healing center at [facility], she was also diagnosed with cellulitis of the right leg and she was started on Augmentin, on completing Augmentin, she was placed on 14-day treatment of doxycycline due to the cellulitis as well as to the wound on right heel.  Patient also developed a second toe infection (around April 5), she continued to follow with the wound center, second round of doxycycline was started after completing the first round.  She now complains of foul-smelling drainage from the wound which started a few days ago, patient was then asked to go to the ED for further evaluation and management. On reviewing patient's medical record from care everywhere, it was noted that she was seen for panniculitis at [facility] plastic and reconstructive surgery at [facility] on 01/01/2021 due to right thigh wound during which patient was noted to have a right medial thigh lesion with associated nonhealing wound and abdominal panniculus.  ED Course:  In the emergency department, she was hemodynamically stable.  Work-up in the ED showed normal CBC and BMP except for hyperglycemia, lactic acid was  1.3 > 0.8.  Influenza A, B and SARS coronavirus 2 was negative. Right foot x-ray showed no evidence of osteomyelitis within the foot but showed soft tissue swelling in the lower calf/ankle. She was empirically started on IV vancomycin and Zosyn.  Hospitalist was asked to admit patient for further evaluation and management.  Review of Systems: Constitutional: Negative for chills and fever.  HENT: Negative for ear pain and sore throat.   Eyes: Negative for pain and visual disturbance.  Respiratory: Negative for cough, chest tightness and shortness of breath.   Cardiovascular: Negative for chest pain and palpitations.  Gastrointestinal: Negative for abdominal pain and vomiting.  Endocrine: Negative for polyphagia and polyuria.  Genitourinary: Negative for decreased urine volume, dysuria, enuresis Musculoskeletal: Positive for right heel and right second toe pain and drainage. Skin: Positive for right leg rash and redness.    Allergic/Immunologic: Negative for immunocompromised state.  Neurological: Negative for tremors, syncope, speech difficulty, weakness, light-headedness and headaches.  Hematological: Does not bruise/bleed easily.  All other systems reviewed and are negative   Past Medical History:  Diagnosis Date  . Back pain   . Cellulitis   . Chronic GERD   . Diabetes mellitus without complication (HCC)   . Hypertension    Social History:  has no history on file for tobacco use, alcohol use, and drug use.   No Known Allergies  No family history on file.    Prior to Admission medications   Medication Sig Start Date End Date Taking? Authorizing Provider  ARIPiprazole (ABILIFY) 5 MG tablet Take 5 mg by  mouth daily. 03/18/19  Yes [provider]  doxycycline (VIBRA-TABS) 100 MG tablet Take 100 mg by mouth 2 (two) times daily. 01/10/21  Yes [provider]  escitalopram (LEXAPRO) 20 MG tablet Take 1 tablet by mouth daily. 11/03/20  Yes [provider]   HYDROcodone-acetaminophen (NORCO) 10-325 MG tablet Take 1 tablet by mouth 3 (three) times daily. 01/11/21  Yes [provider]  JARDIANCE 25 MG TABS tablet Take 25 mg by mouth daily. 12/30/20  Yes [provider]  ketoconazole (NIZORAL) 2 % shampoo Apply 1 application topically 2 (two) times a week. 12/31/18  Yes [provider]  lisinopril (ZESTRIL) 20 MG tablet Take 1 tablet by mouth daily. 10/13/20  Yes [provider]  Multiple Vitamin (MULTI-VITAMIN) tablet Take 1 tablet by mouth daily.   Yes [provider]  nystatin (MYCOSTATIN/NYSTOP) powder Apply topically 2 (two) times daily. to affected area(s) 08/08/20  Yes [provider]  Omega-3 1000 MG CAPS Take 2 capsules by mouth daily.   Yes [provider]  pantoprazole (PROTONIX) 40 MG tablet Take 1 tablet by mouth daily. 12/05/20  Yes [provider]  sitaGLIPtin-metformin (JANUMET) 50-500 MG tablet Take 1 tablet by mouth 2 (two) times daily with a meal.   Yes [provider]  triamcinolone lotion (KENALOG) 0.1 % Apply 1 application topically daily.   Yes [provider]    Physical Exam: BP 118/68 (BP Location: Left Arm)   Pulse 88   Temp 98.3 F (36.8 C) (Oral)   Resp 16   Ht 5\' 5"  (1.651 m)   SpO2 97%   . General: 55 y.o. year-old female morbidly obese and not in no acute distress.  Alert and oriented x3. 40 HEENT, NCAT, EOMI . Neck: Supple, trachea medial . Cardiovascular: Regular rate and rhythm with no rubs or gallops.  No thyromegaly or JVD noted.  No lower extremity edema. 2/4 pulses in all 4 extremities. Marland Kitchen Respiratory: Clear to auscultation with no wheezes or rales. Good inspiratory effort. . Abdomen: Soft nontender nondistended with normal bowel sounds x4 quadrants. . Muskuloskeletal: Right heel and right 2nd toe wound with foul-smelling and purulent drainage. . Neuro: CN II-XII intact, sensation, reflexes intact . Skin: No ulcerative lesions  noted or rashes . Psychiatry: Judgement and insight appear normal. Mood is appropriate for condition and setting          Labs on Admission:  Basic Metabolic Panel: Recent Labs  Lab 01/25/21 1740  NA 135  K 4.5  CL 100  CO2 26  GLUCOSE 160*  BUN 17  CREATININE 0.42*  CALCIUM 9.0   Liver Function Tests: Recent Labs  Lab 01/25/21 1740  AST 14*  ALT 15  ALKPHOS 88  BILITOT 0.7  PROT 7.8  ALBUMIN 3.6   No results for input(s): LIPASE, AMYLASE in the last 168 hours. No results for input(s): AMMONIA in the last 168 hours. CBC: Recent Labs  Lab 01/25/21 1740  WBC 10.5  NEUTROABS 8.1*  HGB 13.0  HCT 41.1  MCV 88.0  PLT 399   Cardiac Enzymes: No results for input(s): CKTOTAL, CKMB, CKMBINDEX, TROPONINI in the last 168 hours.  BNP (last 3 results) No results for input(s): BNP in the last 8760 hours.  ProBNP (last 3 results) No results for input(s): PROBNP in the last 8760 hours.  CBG: No results for input(s): GLUCAP in the last 168 hours.  Radiological Exams on Admission: DG Foot Complete Right  Result Date: 01/25/2021 CLINICAL DATA:  Foot pain, drainage.  Question osteomyelitis. EXAM: RIGHT FOOT COMPLETE - 3+ VIEW COMPARISON:  None. FINDINGS: Hardware noted in the distal tibia and fibula. Marked soft tissue swelling in the distal calf and ankle region. No acute bony abnormality. Specifically, no fracture, subluxation, or dislocation. Plantar calcaneal spur. No bone destruction to suggest osteomyelitis. IMPRESSION: No evidence of osteomyelitis within the foot. Soft tissue swelling in the lower calf/ankle. Electronically Signed   By: Charlett Nose M.D.   On: 01/25/2021 17:33    EKG: I independently viewed the EKG done and my findings are as followed: No EKG was In the ED   Assessment/Plan Present on Admission: . Wound infection  Principal Problem:   Wound infection Active Problems:   Cellulitis   GERD (gastroesophageal reflux disease)   Hyperglycemia due  to diabetes mellitus (HCC)   Essential hypertension   Morbid obesity with BMI of 60.0-69.9, adult (HCC)  Right heel and 2nd toe wound and Right leg cellulitis S/P failure of outpatient antibiotic treatment Abdominal panniculus wound Patient was following [facility] healthcare wound healing center at [facility] Patient was already treated with Augmentin and is now going through second round of doxycycline with worsening wound infection, though improved cellulitis She was treated with IV vancomycin and Zosyn in the ED, which we will continue at this time Patient may require incision and drainage, general surgery will be consulted, and we shall await further recommendation Patient will be placed n.p.o. at midnight Continue wound care  Hyperglycemia secondary to T2DM Continue ISS and hypoglycemic protocol Janumet will be held at this time [place] Pepper will be held at this time  Essential hypertension (controlled) Home meds will be continued when patient resumes oral intake  Chronic back pain Home Norco will be continued when patient resumes oral intake  Morbid obesity (BMI 61) Patient will be advised on diet and lifestyle modification She followed with [facility] plastic and reconstructive surgery at [facility]    DVT prophylaxis: SCDs  Code Status: Full code  Family Communication: Son at bedside (all questions answered to satisfaction)  Disposition Plan:  Patient is from:                        home Anticipated DC to:                   SNF or family members home Anticipated DC date:               2-3 days Anticipated DC barriers:         Patient requires inpatient management due to nonhealing wounds and outpatient antibiotic failure treatment   Consults called: General surgery  Admission status: Inpatient    Frankey Shown MD [facility]  01/26/2021, 12:18 AM

## 2021-01-25 NOTE — ED Triage Notes (Signed)
Emergency Medicine Provider Triage Evaluation Note  [name] , a 55 y.o. female  was evaluated in triage.  Pt complains of right foot pain and drainage.  Pain began 2 months ago.  Drainage began yesterday.  Sent by doctor to rule out osteo-.  Review of Systems  Positive: Pain and drainage of the right foot Negative: No fevers  Physical Exam  BP 120/80   Pulse 93   Temp 99.4 F (37.4 C) (Oral)   Resp 18   Ht 5\' 5"  (1.651 m)   SpO2 95%  Gen:   Awake, no distress   HEENT:  Atraumatic  Resp:  Normal effort  Cardiac:  Normal rate  Abd:   Nondistended, nontender  MSK:   Moves extremities without difficulty  Neuro:  Speech clear   Medical Decision Making  Medically screening exam initiated at 4:34 PM.  Appropriate orders placed.  [name] was informed that the remainder of the evaluation will be completed by another provider, this initial triage assessment does not replace that evaluation, and the importance of remaining in the ED until their evaluation is complete.  Clinical Impression   Patient with right foot pain and drainage.  Will obtain labs and x-ray to rule out osteo    Darral Dash, PA-C 01/25/21 1636

## 2021-01-25 NOTE — ED Provider Notes (Signed)
[facility] EMERGENCY DEPARTMENT Provider Note   CSN: [id number] Arrival date & time: 01/25/21  1525     History Chief Complaint  Patient presents with  . Foot Pain    [name] is a 55 y.o. female.  [name] is a 55 y.o. female with a history of hypertension, GERD, diabetes and back pain as well as chronic cellulitis, who presents to the emergency department for evaluation of right foot pain and drainage.  Patient reports that she has had struggles with cellulitis and wound of her right calf for a long time and is followed by the [facility] wound center.  About 2 weeks ago noticed redness and swelling to the right second toe initially starting around the nailbed after she reported receiving some sort of injection.  She reports that the wound center started her back on doxycycline and Bactrim for this but despite this it has not gotten any better.  She reports yesterday she started noticed some drainage from the tip of her toe.  She also reports a wound on the heel that has been getting worse and has become increasingly painful.  Cellulitis on her calf is overall unchanged.  She has not had any fevers, chills, nausea or systemic symptoms.  Called and spoke to Dr. Nolen Mu, her podiatrist who recommended she come to the ED to rule out osteomyelitis.  Patient is diabetic.        Past Medical History:  Diagnosis Date  . Back pain   . Chronic GERD   . Diabetes mellitus without complication (HCC)   . Hypertension     Patient Active Problem List   Diagnosis Date Noted  . Wound infection 01/25/2021      OB History   No obstetric history on file.     No family history on file.     Home Medications Prior to Admission medications   Medication Sig Start Date End Date Taking? Authorizing Provider  ARIPiprazole (ABILIFY) 5 MG tablet Take 5 mg by mouth daily. 03/18/19  Yes [provider]  doxycycline (VIBRA-TABS) 100 MG tablet Take 100 mg by mouth 2 (two) times daily.  01/10/21  Yes [provider]  escitalopram (LEXAPRO) 20 MG tablet Take 1 tablet by mouth daily. 11/03/20  Yes [provider]  HYDROcodone-acetaminophen (NORCO) 10-325 MG tablet Take 1 tablet by mouth 3 (three) times daily. 01/11/21  Yes [provider]  JARDIANCE 25 MG TABS tablet Take 25 mg by mouth daily. 12/30/20  Yes [provider]  ketoconazole (NIZORAL) 2 % shampoo Apply 1 application topically 2 (two) times a week. 12/31/18  Yes [provider]  lisinopril (ZESTRIL) 20 MG tablet Take 1 tablet by mouth daily. 10/13/20  Yes [provider]  Multiple Vitamin (MULTI-VITAMIN) tablet Take 1 tablet by mouth daily.   Yes [provider]  nystatin (MYCOSTATIN/NYSTOP) powder Apply topically 2 (two) times daily. to affected area(s) 08/08/20  Yes [provider]  Omega-3 1000 MG CAPS Take 2 capsules by mouth daily.   Yes [provider]  pantoprazole (PROTONIX) 40 MG tablet Take 1 tablet by mouth daily. 12/05/20  Yes [provider]  sitaGLIPtin-metformin (JANUMET) 50-500 MG tablet Take 1 tablet by mouth 2 (two) times daily with a meal.   Yes [provider]  triamcinolone lotion (KENALOG) 0.1 % Apply 1 application topically daily.   Yes [provider]    Allergies    Patient has no known allergies.  Review of Systems   Review  of Systems  Constitutional: Negative for chills and fever.  HENT: Negative.   Cardiovascular: Positive for leg swelling (Chronic).  Gastrointestinal: Negative for nausea and vomiting.  Skin: Positive for wound.  All other systems reviewed and are negative.   Physical Exam Updated Vital Signs BP 118/68 (BP Location: Left Arm)   Pulse 88   Temp 98.3 F (36.8 C) (Oral)   Resp 16   Ht 5\' 5"  (1.651 m)   SpO2 97%   Physical Exam Vitals and nursing note reviewed.  Constitutional:      General: She is not in acute distress.    Appearance: She is well-developed. She is  obese. She is not diaphoretic.     Comments: Alert, chronically ill-appearing but in no acute distress  HENT:     Head: Normocephalic and atraumatic.  Eyes:     General:        Right eye: No discharge.        Left eye: No discharge.  Cardiovascular:     Rate and Rhythm: Normal rate.     Pulses: Normal pulses.     Heart sounds: Normal heart sounds.  Pulmonary:     Effort: Pulmonary effort is normal. No respiratory distress.     Breath sounds: Normal breath sounds.  Abdominal:     General: There is no distension.     Tenderness: There is no abdominal tenderness.  Musculoskeletal:     Comments: Chronic swelling of bilateral lower extremities, worse on the right than left with chronic cellulitis and wound to the posterior calf.  There is erythema and swelling of the second digit with drainage and wound to the tip of the toe.  Patient also with wound to the bottom of the heel with dark tissue at the borders concerning for necrosis.  Ulceration developing to the side of the heel as well.  Foul-smelling drainage coming from the toe.  Skin:    General: Skin is warm and dry.  Neurological:     Mental Status: She is alert and oriented to person, place, and time.     Coordination: Coordination normal.  Psychiatric:        Mood and Affect: Mood normal.        Behavior: Behavior normal.     ED Results / Procedures / Treatments   Labs (all labs ordered are listed, but only abnormal results are displayed) Labs Reviewed  CBC WITH DIFFERENTIAL/PLATELET - Abnormal; Notable for the following components:      Result Value   Neutro Abs 8.1 (*)    Monocytes Absolute 1.1 (*)    All other components within normal limits  COMPREHENSIVE METABOLIC PANEL - Abnormal; Notable for the following components:   Glucose, Bld 160 (*)    Creatinine, Ser 0.42 (*)    AST 14 (*)    All other components within normal limits  RESP PANEL BY RT-PCR (FLU A&B, COVID) [id number]  LACTIC ACID, PLASMA  LACTIC ACID, PLASMA     EKG None  Radiology DG Foot Complete Right  Result Date: 01/25/2021 CLINICAL DATA:  Foot pain, drainage.  Question osteomyelitis. EXAM: RIGHT FOOT COMPLETE - 3+ VIEW COMPARISON:  None. FINDINGS: Hardware noted in the distal tibia and fibula. Marked soft tissue swelling in the distal calf and ankle region. No acute bony abnormality. Specifically, no fracture, subluxation, or dislocation. Plantar calcaneal spur. No bone destruction to suggest osteomyelitis. IMPRESSION: No evidence of osteomyelitis within the foot. Soft tissue swelling in the lower calf/ankle. Electronically Signed  By: Charlett Nose M.D.   On: 01/25/2021 17:33    Procedures Procedures   Medications Ordered in ED Medications  vancomycin (VANCOCIN) IVPB 1000 mg/200 mL premix (0 mg Intravenous Stopped 01/25/21 2215)  piperacillin-tazobactam (ZOSYN) IVPB 3.375 g (0 g Intravenous Stopped 01/25/21 2106)    ED Course  I have reviewed the triage vital signs and the nursing notes.  Pertinent labs & imaging results that were available during my care of the patient were reviewed by me and considered in my medical decision making (see chart for details).    MDM Rules/Calculators/A&P                         55 year old female presents with wound to the right second toe and heel of her right foot persistent and worsening over the last few weeks despite doxycycline and Bactrim as outpatient.  Started having drainage from the right toe yesterday that is foul-smelling with increasing pain and swelling.  No fevers or systemic symptoms.  On arrival she has normal vitals, chronically ill-appearing but in no acute distress.  Already has chronic cellulitis of the right calf but this wound seems to be new and worsening.  Patient is diabetic.  Sent by podiatrist to rule out osteomyelitis.  I have independently ordered, reviewed and interpreted all labs and imaging: CBC: No leukocytosis, normal hemoglobin CMP: Glucose 160, no other  significant electrolyte derangements, normal renal and liver function Lactic: Not elevated at 0.8  X-ray of the right foot with no evidence of osteomyelitis, soft tissue swelling noted in the lower ankle and calf.  Work-up today has been reassuring, but concerned patient has failed to outpatient antibiotic regimens and has had no improvement and now has foul-smelling drainage from the toe.  Will start on broad-spectrum antibiotics for diabetic foot wound.  Case discussed with Dr. Thomes Dinning with [facility] who will see and admit the patient.  Final Clinical Impression(s) / ED Diagnoses Final diagnoses:  Diabetic foot infection [facility])    Rx / DC Orders ED Discharge Orders    None       Legrand Rams 01/25/21 2353    Pricilla Loveless, MD 01/27/21 [phone]

## 2021-01-25 NOTE — ED Triage Notes (Signed)
Right foot pain with drainage

## 2021-01-26 ENCOUNTER — Encounter (HOSPITAL_COMMUNITY): Payer: Self-pay | Admitting: Internal Medicine

## 2021-01-26 ENCOUNTER — Inpatient Hospital Stay (HOSPITAL_COMMUNITY): Payer: BC Managed Care – PPO

## 2021-01-26 DIAGNOSIS — E11628 Type 2 diabetes mellitus with other skin complications: Secondary | ICD-10-CM

## 2021-01-26 DIAGNOSIS — I10 Essential (primary) hypertension: Secondary | ICD-10-CM

## 2021-01-26 DIAGNOSIS — L0390 Cellulitis, unspecified: Secondary | ICD-10-CM

## 2021-01-26 DIAGNOSIS — E1165 Type 2 diabetes mellitus with hyperglycemia: Secondary | ICD-10-CM

## 2021-01-26 DIAGNOSIS — K219 Gastro-esophageal reflux disease without esophagitis: Secondary | ICD-10-CM

## 2021-01-26 LAB — GLUCOSE, CAPILLARY
Glucose-Capillary: 134 mg/dL — ABNORMAL HIGH (ref 70–99)
Glucose-Capillary: 126 mg/dL — ABNORMAL HIGH (ref 70–99)
Glucose-Capillary: 184 mg/dL — ABNORMAL HIGH (ref 70–99)
Glucose-Capillary: 169 mg/dL — ABNORMAL HIGH (ref 70–99)
Glucose-Capillary: 197 mg/dL — ABNORMAL HIGH (ref 70–99)

## 2021-01-26 LAB — COMPREHENSIVE METABOLIC PANEL
ALT: 13 U/L (ref 0–44)
AST: 12 U/L — ABNORMAL LOW (ref 15–41)
Albumin: 3.2 g/dL — ABNORMAL LOW (ref 3.5–5.0)
Alkaline Phosphatase: 79 U/L (ref 38–126)
Anion gap: 8 (ref 5–15)
BUN: 14 mg/dL (ref 6–20)
CO2: 28 mmol/L (ref 22–32)
Calcium: 8.8 mg/dL — ABNORMAL LOW (ref 8.9–10.3)
Chloride: 100 mmol/L (ref 98–111)
Creatinine, Ser: 0.37 mg/dL — ABNORMAL LOW (ref 0.44–1.00)
GFR, Estimated: >60 mL/min (ref >60)
Glucose, Bld: 138 mg/dL — ABNORMAL HIGH (ref 70–99)
Potassium: 3.6 mmol/L (ref 3.5–5.1)
Sodium: 136 mmol/L (ref 135–145)
Total Bilirubin: 0.8 mg/dL (ref 0.3–1.2)
Total Protein: 6.9 g/dL (ref 6.5–8.1)

## 2021-01-26 LAB — CBC
HCT: 39.7 % (ref 36.0–46.0)
Hemoglobin: 12.6 g/dL (ref 12.0–15.0)
MCH: 28 pg (ref 26.0–34.0)
MCHC: 31.7 g/dL (ref 30.0–36.0)
MCV: 88.2 fL (ref 80.0–100.0)
Platelets: 390 10*3/uL (ref 150–400)
RBC: 4.5 MIL/uL (ref 3.87–5.11)
RDW: 13.7 % (ref 11.5–15.5)
WBC: 9.3 10*3/uL (ref 4.0–10.5)
nRBC: 0 % (ref 0.0–0.2)

## 2021-01-26 LAB — APTT: aPTT: 31 seconds (ref 24–36)

## 2021-01-26 LAB — PHOSPHORUS: Phosphorus: 3.8 mg/dL (ref 2.5–4.6)

## 2021-01-26 LAB — HEMOGLOBIN A1C
Hgb A1c MFr Bld: 7.4 % — ABNORMAL HIGH (ref 4.8–5.6)
Mean Plasma Glucose: 165.68 mg/dL

## 2021-01-26 LAB — MAGNESIUM: Magnesium: 1.9 mg/dL (ref 1.7–2.4)

## 2021-01-26 LAB — HIV ANTIBODY (ROUTINE TESTING W REFLEX): HIV Screen 4th Generation wRfx: NONREACTIVE

## 2021-01-26 LAB — PROTIME-INR
INR: 1.1 (ref 0.8–1.2)
Prothrombin Time: 14.4 seconds (ref 11.4–15.2)

## 2021-01-26 LAB — SEDIMENTATION RATE: Sed Rate: 50 mm/hr — ABNORMAL HIGH (ref 0–22)

## 2021-01-26 MED ORDER — OMEGA-3-ACID ETHYL ESTERS 1 G PO CAPS
2000.0000 mg | ORAL_CAPSULE | Freq: Every day | ORAL | Status: DC
  Administered 2021-01-26 – 2021-01-28 (×3): 2000 mg via ORAL
  Filled 2021-01-26 (×3): qty 2

## 2021-01-26 MED ORDER — ARIPIPRAZOLE 5 MG PO TABS
5.0000 mg | ORAL_TABLET | Freq: Every day | ORAL | Status: DC
  Administered 2021-01-26 – 2021-01-28 (×3): 5 mg via ORAL
  Filled 2021-01-26 (×3): qty 1

## 2021-01-26 MED ORDER — VANCOMYCIN HCL 1750 MG/350ML IV SOLN
1750.0000 mg | Freq: Two times a day (BID) | INTRAVENOUS | Status: DC
  Administered 2021-01-27 – 2021-01-28 (×4): 1750 mg via INTRAVENOUS
  Filled 2021-01-26 (×6): qty 350

## 2021-01-26 MED ORDER — ADULT MULTIVITAMIN W/MINERALS CH
1.0000 | ORAL_TABLET | Freq: Every day | ORAL | Status: DC
  Administered 2021-01-26 – 2021-01-28 (×3): 1 via ORAL
  Filled 2021-01-26 (×3): qty 1

## 2021-01-26 MED ORDER — VANCOMYCIN HCL 2000 MG/400ML IV SOLN
2000.0000 mg | Freq: Two times a day (BID) | INTRAVENOUS | Status: DC
  Administered 2021-01-26: 2000 mg via INTRAVENOUS
  Filled 2021-01-26 (×7): qty 400

## 2021-01-26 MED ORDER — INSULIN ASPART 100 UNIT/ML ~~LOC~~ SOLN: 0.0000 [IU] | SUBCUTANEOUS | Status: DC

## 2021-01-26 MED ORDER — VANCOMYCIN HCL 1500 MG/300ML IV SOLN
1500.0000 mg | Freq: Once | INTRAVENOUS | Status: AC
  Administered 2021-01-26: 1500 mg via INTRAVENOUS
  Filled 2021-01-26: qty 300

## 2021-01-26 MED ORDER — PIPERACILLIN-TAZOBACTAM 3.375 G IVPB
3.3750 g | Freq: Three times a day (TID) | INTRAVENOUS | Status: DC
  Administered 2021-01-26 – 2021-01-27 (×5): 3.375 g via INTRAVENOUS
  Filled 2021-01-26 (×5): qty 50

## 2021-01-26 MED ORDER — HYDROCODONE-ACETAMINOPHEN 10-325 MG PO TABS: 1.0000 | ORAL_TABLET | Freq: Three times a day (TID) | ORAL | Status: DC

## 2021-01-26 MED ORDER — PANTOPRAZOLE SODIUM 40 MG PO TBEC
40.0000 mg | DELAYED_RELEASE_TABLET | Freq: Every day | ORAL | Status: DC
  Administered 2021-01-26 – 2021-01-28 (×3): 40 mg via ORAL
  Filled 2021-01-26 (×3): qty 1

## 2021-01-26 MED ORDER — HYDROCODONE-ACETAMINOPHEN 10-325 MG PO TABS
1.0000 | ORAL_TABLET | Freq: Four times a day (QID) | ORAL | Status: DC | PRN
  Administered 2021-01-26 – 2021-01-28 (×8): 1 via ORAL
  Filled 2021-01-26 (×8): qty 1

## 2021-01-26 MED ORDER — ESCITALOPRAM OXALATE 10 MG PO TABS
20.0000 mg | ORAL_TABLET | Freq: Every day | ORAL | Status: DC
  Administered 2021-01-26 – 2021-01-28 (×3): 20 mg via ORAL
  Filled 2021-01-26 (×3): qty 2

## 2021-01-26 MED ORDER — INSULIN ASPART 100 UNIT/ML ~~LOC~~ SOLN
0.0000 [IU] | Freq: Three times a day (TID) | SUBCUTANEOUS | Status: DC
  Administered 2021-01-26 – 2021-01-28 (×7): 3 [IU] via SUBCUTANEOUS

## 2021-01-26 MED ORDER — PIPERACILLIN-TAZOBACTAM 3.375 G IVPB 30 MIN: 3.3750 g | Freq: Four times a day (QID) | INTRAVENOUS | Status: DC

## 2021-01-26 NOTE — Progress Notes (Signed)
Pharmacy Antibiotic Note  [name] is a 55 y.o. female admitted on 01/25/2021 with RLE pain/cellulitis.  Pharmacy has been consulted for Vancomycin dosing.  Vancomycin 1 g IV given in ED at  2145  Plan: Vancomycin 1500 mg IV now, then 2 g IV q12h  Height: 5\' 5"  (165.1 cm) IBW/kg (Calculated) : 57  Temp (24hrs), Avg:98.9 F (37.2 C), Min:98.3 F (36.8 C), Max:99.4 F (37.4 C)  Recent Labs  Lab 01/25/21 1740 01/25/21 1742 01/25/21 1957  WBC 10.5  --   --   CREATININE 0.42*  --   --   LATICACIDVEN  --  1.3 0.8    CrCl cannot be calculated (Unknown ideal weight.).    No Known Allergies  01/27/21 01/26/2021 12:09 AM

## 2021-01-26 NOTE — Consult Note (Signed)
WOC Nurse wound consult note Consultation was completed by review of records, images and assistance from the bedside nurse/clinical staff.    Reason for Consult: LE ulcerations  Wound type: 1. Unstageable Pressure Injury: right plantar heel surface and right lateral heel 2. Partial thickness ulcer; right calf; venous stasis 3. Right 2nd toe; ? Arterial ulcer or trauma with poor arterial blood flow; full thickness Pressure Injury POA: Yes Measurement:see nursing flowsheets Wound bed: 1. R heel: 50% yellow/50% dark purple with yellow; appears to haven been blister which has ruptured and progressed 2. R calf: 80% fibrinous material/20%pink 3. R 2nd toe: 100% yellow  Drainage (amount, consistency, odor)  Periwound: Dressing procedure/placement/frequency:

## 2021-01-26 NOTE — Consult Note (Signed)
Patient seen, chart reviewed.  Full consult to follow MRI results.

## 2021-01-26 NOTE — Progress Notes (Signed)
PROGRESS NOTE  [name] [id number]:[id number] DOB: Jan 10, 1966 DOA: 01/25/2021 PCP: Neale Burly, MD  Brief History:  55 year old female with a history of diabetes mellitus type 2, hypertension, GERD, anxiety/depression, morbid obesity, panniculitis presenting with right foot pain and drainage.  The patient states that she received an injection on the plantar surface of her right foot around 12/06/2020 from her podiatrist for plantar fasciitis.  A couple weeks later, she noted some pain and drainage on the foot and went back to see her podiatrist.  At that time, the patient was sent to the wound care center at [facility], and she has followed up there for continued wound care since then.  Patient has been essentially been on continuous oral antibiotics for the last 4-6 weeks secondary to her right foot wound.  The patient was initially started on a 10-day course of Augmentin.  This was followed by a course of doxycycline which was followed by a second course of doxycycline for 14 days.  Apparently, notation was made that the she had some cellulitis in the right lower extremity calf area which was improving, but she continued to have some drainage on the plantar surface of her foot.  She went to see her PCP on 01/25/2021.  Because of concerns of the wound, the patient was instructed to go to the emergency department for further evaluation.  In addition, the patient noted a wound about her right second toe approximately 3 to 4 weeks ago.  She thought this was secondary to ill fitting shoes.  Since then, she has developed increasing redness and drainage on the second toe.  She denies any fevers, chills, headache, chest pain, shortness breath, coughing, hemoptysis, nausea, vomiting, diarrhea. Review of the medical records also shows that the patient has been struggling with panniculitis and a lipomatous wound on her right medial thigh.  She was subsequently sent to see plastic surgery at the [facility] on  01/01/21.  Plans are in the works for resection of the thigh wound as well as a panniculectomy, but the surgeon wanted the patient to lose weight to minimize complications from surgery. In the emergency department, the patient had low-grade temperature 9 9.4 F.  She was hemodynamically stable with oxygen saturation 100% room air.  BMP showed a serum creatinine 0.42 with unremarkable electrolytes.  LFTs were unremarkable.  WBC 10.5, hemoglobin 13.0, platelets 390,000.  Lactate peaked 1.3.   The patient was started on vancomycin and Zosyn and admitted for further evaluation and treatment.  Assessment/Plan: Right diabetic foot infection -Check CRP -Check ESR -MRI right foot -Continue vancomycin and Zosyn -There was a superficial wound culture at [facility] with MRSA and Corynebacterium -general surgery consult -Continue hydrocodone for pain  Cellulitis right lower extremity/venous stasis dermatitis -Wound care consult -Please see photographs below  Diabetes mellitus type 2 -Hemoglobin A1c -NovoLog sliding scale -Holding Jardiance and sitagliptin/metformin  Essential hypertension -holding lisinopril temporarily -Blood pressure is well controlled presently  Morbid obesity -BMI 61 -Lifestyle modification  Depression/anxiety -Continue Abilify and Lexapro  GERD -Continue Protonix       Status is: Inpatient  Remains inpatient appropriate because:IV treatments appropriate due to intensity of illness or inability to take PO   Dispo: The patient is from: Home              Anticipated d/c is to: Home              Patient currently is not medically  stable to d/c.   Difficult to place patient No        Family Communication:   No Family at bedside  Consultants:  General surgery  Code Status:  FULL  DVT Prophylaxis:  Hermann Lovenox   Procedures: As Listed in Progress Note Above  Antibiotics: None     Subjective: Patient denies fevers, chills, headache, chest pain,  dyspnea, nausea, vomiting, diarrhea, abdominal pain, dysuria, hematuria, hematochezia, and melena. She complains of right foot pain  Objective: Vitals:   01/25/21 1801 01/25/21 2217 01/26/21 0058 01/26/21 0418  BP: 111/65 118/68 127/73 125/78  Pulse: 85 88 86 85  Resp: '16 16 19 16  ' Temp:  98.3 F (36.8 C) 98 F (36.7 C) 98 F (36.7 C)  TempSrc:  Oral  Oral  SpO2: 100% 97% 100% 100%  Height:        Intake/Output Summary (Last 24 hours) at 01/26/2021 0750 Last data filed at 01/26/2021 0600 Gross per 24 hour  Intake 538.04 ml  Output --  Net 538.04 ml   Weight change:  Exam:   General:  Pt is alert, follows commands appropriately, not in acute distress  HEENT: No icterus, No thrush, No neck mass, [place]/AT  Cardiovascular: RRR, S1/S2, no rubs, no gallops  Respiratory: CTA bilaterally, no wheezing, no crackles, no rhonchi  Abdomen: Soft/+BS, non tender, non distended, no guarding  Extremities: see pictures below RIGHT POSTERIOR CALF    RIGHT PLANTAR FOOT     RIGHT SECOND TOE      Data Reviewed: I have personally reviewed following labs and imaging studies Basic Metabolic Panel: Recent Labs  Lab 01/25/21 1740 01/26/21 0430  NA 135 136  K 4.5 3.6  CL 100 100  CO2 26 28  GLUCOSE 160* 138*  BUN 17 14  CREATININE 0.42* 0.37*  CALCIUM 9.0 8.8*  MG  --  1.9  PHOS  --  3.8   Liver Function Tests: Recent Labs  Lab 01/25/21 1740 01/26/21 0430  AST 14* 12*  ALT 15 13  ALKPHOS 88 79  BILITOT 0.7 0.8  PROT 7.8 6.9  ALBUMIN 3.6 3.2*   No results for input(s): LIPASE, AMYLASE in the last 168 hours. No results for input(s): AMMONIA in the last 168 hours. Coagulation Profile: Recent Labs  Lab 01/26/21 0430  INR 1.1   CBC: Recent Labs  Lab 01/25/21 1740 01/26/21 0430  WBC 10.5 9.3  NEUTROABS 8.1*  --   HGB 13.0 12.6  HCT 41.1 39.7  MCV 88.0 88.2  PLT 399 390   Cardiac Enzymes: No results for input(s): CKTOTAL, CKMB, CKMBINDEX, TROPONINI in  the last 168 hours. BNP: Invalid input(s): POCBNP CBG: Recent Labs  Lab 01/26/21 0414  GLUCAP 134*   HbA1C: No results for input(s): HGBA1C in the last 72 hours. Urine analysis: No results found for: COLORURINE, APPEARANCEUR, LABSPEC, PHURINE, GLUCOSEU, HGBUR, BILIRUBINUR, KETONESUR, PROTEINUR, UROBILINOGEN, NITRITE, LEUKOCYTESUR Sepsis Labs: '@LABRCNTIP' (procalcitonin:4,lacticidven:4) ) Recent Results (from the past 240 hour(s))  Resp Panel by RT-PCR (Flu A&B, Covid) Nasopharyngeal Swab     Status: None   Collection Time: 01/25/21  8:33 PM   Specimen: Nasopharyngeal Swab; Nasopharyngeal(NP) swabs in vial transport medium  Result Value Ref Range Status   SARS Coronavirus 2 by RT PCR NEGATIVE NEGATIVE Final    Comment: (NOTE) SARS-CoV-2 target nucleic acids are NOT DETECTED.  The SARS-CoV-2 RNA is generally detectable in upper respiratory specimens during the acute phase of infection. The lowest concentration of SARS-CoV-2 viral copies this assay  can detect is 138 copies/mL. A negative result does not preclude SARS-Cov-2 infection and should not be used as the sole basis for treatment or other patient management decisions. A negative result may occur with  improper specimen collection/handling, submission of specimen other than nasopharyngeal swab, presence of viral mutation(s) within the areas targeted by this assay, and inadequate number of viral copies(<138 copies/mL). A negative result must be combined with clinical observations, patient history, and epidemiological information. The expected result is Negative.  Fact Sheet for Patients:  EntrepreneurPulse.com.au  Fact Sheet for Healthcare Providers:  IncredibleEmployment.be  This test is no t yet approved or cleared by the [place] FDA and  has been authorized for detection and/or diagnosis of SARS-CoV-2 by FDA under an Emergency Use Authorization (EUA). This EUA will remain  in  effect (meaning this test can be used) for the duration of the COVID-19 declaration under Section 564(b)(1) of the Act, 21 U.S.C.section 360bbb-3(b)(1), unless the authorization is terminated  or revoked sooner.       Influenza A by PCR NEGATIVE NEGATIVE Final   Influenza B by PCR NEGATIVE NEGATIVE Final    Comment: (NOTE) The Xpert Xpress SARS-CoV-2/FLU/RSV plus assay is intended as an aid in the diagnosis of influenza from Nasopharyngeal swab specimens and should not be used as a sole basis for treatment. Nasal washings and aspirates are unacceptable for Xpert Xpress SARS-CoV-2/FLU/RSV testing.  Fact Sheet for Patients: EntrepreneurPulse.com.au  Fact Sheet for Healthcare Providers: IncredibleEmployment.be  This test is not yet approved or cleared by the [place] FDA and has been authorized for detection and/or diagnosis of SARS-CoV-2 by FDA under an Emergency Use Authorization (EUA). This EUA will remain in effect (meaning this test can be used) for the duration of the COVID-19 declaration under Section 564(b)(1) of the Act, 21 U.S.C. section 360bbb-3(b)(1), unless the authorization is terminated or revoked.  Performed at [facility], [address], [place], [place] [address]      Scheduled Meds: . insulin aspart  0-15 Units Subcutaneous Q4H   Continuous Infusions: . piperacillin-tazobactam (ZOSYN)  IV 3.375 g (01/26/21 0555)  . vancomycin      Procedures/Studies: DG Foot Complete Right  Result Date: 01/25/2021 CLINICAL DATA:  Foot pain, drainage.  Question osteomyelitis. EXAM: RIGHT FOOT COMPLETE - 3+ VIEW COMPARISON:  None. FINDINGS: Hardware noted in the distal tibia and fibula. Marked soft tissue swelling in the distal calf and ankle region. No acute bony abnormality. Specifically, no fracture, subluxation, or dislocation. Plantar calcaneal spur. No bone destruction to suggest osteomyelitis. IMPRESSION: No evidence of  osteomyelitis within the foot. Soft tissue swelling in the lower calf/ankle. Electronically Signed   By: Rolm Baptise M.D.   On: 01/25/2021 17:33    Orson Eva, DO  [facility]  If 7PM-7AM, please contact night-coverage [email or web address] Password [facility] 01/26/2021, 7:50 AM   LOS: 1 day

## 2021-01-27 ENCOUNTER — Inpatient Hospital Stay: Payer: Self-pay

## 2021-01-27 DIAGNOSIS — E11628 Type 2 diabetes mellitus with other skin complications: Secondary | ICD-10-CM

## 2021-01-27 DIAGNOSIS — L03119 Cellulitis of unspecified part of limb: Secondary | ICD-10-CM

## 2021-01-27 DIAGNOSIS — L089 Local infection of the skin and subcutaneous tissue, unspecified: Secondary | ICD-10-CM

## 2021-01-27 LAB — BASIC METABOLIC PANEL
Anion gap: 8 (ref 5–15)
BUN: 17 mg/dL (ref 6–20)
CO2: 26 mmol/L (ref 22–32)
Calcium: 8.8 mg/dL — ABNORMAL LOW (ref 8.9–10.3)
Chloride: 100 mmol/L (ref 98–111)
Creatinine, Ser: 0.46 mg/dL (ref 0.44–1.00)
GFR, Estimated: >60 mL/min (ref >60)
Glucose, Bld: 190 mg/dL — ABNORMAL HIGH (ref 70–99)
Potassium: 3.8 mmol/L (ref 3.5–5.1)
Sodium: 134 mmol/L — ABNORMAL LOW (ref 135–145)

## 2021-01-27 LAB — GLUCOSE, CAPILLARY
Glucose-Capillary: 181 mg/dL — ABNORMAL HIGH (ref 70–99)
Glucose-Capillary: 169 mg/dL — ABNORMAL HIGH (ref 70–99)
Glucose-Capillary: 172 mg/dL — ABNORMAL HIGH (ref 70–99)
Glucose-Capillary: 176 mg/dL — ABNORMAL HIGH (ref 70–99)

## 2021-01-27 LAB — CBC
HCT: 41.3 % (ref 36.0–46.0)
Hemoglobin: 13 g/dL (ref 12.0–15.0)
MCH: 27.5 pg (ref 26.0–34.0)
MCHC: 31.5 g/dL (ref 30.0–36.0)
MCV: 87.3 fL (ref 80.0–100.0)
Platelets: 416 10*3/uL — ABNORMAL HIGH (ref 150–400)
RBC: 4.73 MIL/uL (ref 3.87–5.11)
RDW: 13.5 % (ref 11.5–15.5)
WBC: 8.4 10*3/uL (ref 4.0–10.5)
nRBC: 0 % (ref 0.0–0.2)

## 2021-01-27 LAB — C-REACTIVE PROTEIN: CRP: 6.3 mg/dL — ABNORMAL HIGH (ref <1.0)

## 2021-01-27 LAB — MAGNESIUM: Magnesium: 1.9 mg/dL (ref 1.7–2.4)

## 2021-01-27 MED ORDER — CHLORHEXIDINE GLUCONATE CLOTH 2 % EX PADS
6.0000 | MEDICATED_PAD | Freq: Every day | CUTANEOUS | Status: DC
  Administered 2021-01-27 – 2021-01-28 (×2): 6 via TOPICAL

## 2021-01-27 MED ORDER — SODIUM CHLORIDE 0.9 % IV SOLN
2.0000 g | INTRAVENOUS | Status: DC
  Administered 2021-01-27 – 2021-01-28 (×2): 2 g via INTRAVENOUS
  Filled 2021-01-27 (×2): qty 20

## 2021-01-27 MED ORDER — SODIUM CHLORIDE 0.9% FLUSH
10.0000 mL | INTRAVENOUS | Status: DC | PRN
Start: 2021-01-27 — End: 2021-01-28

## 2021-01-27 MED ORDER — SODIUM CHLORIDE 0.9% FLUSH
10.0000 mL | Freq: Two times a day (BID) | INTRAVENOUS | Status: DC
  Administered 2021-01-27 – 2021-01-28 (×3): 10 mL

## 2021-01-27 NOTE — Consult Note (Signed)
Reason for Consult: Cellulitis, right second toe Referring Physician: Dr. Maryan Rued is an 55 y.o. female.  HPI: Patient is a 55 year old morbidly obese white female with multiple medical problems who has been followed by the wound care center at [facility] for panniculitis as well as a wound on her right medial thigh who developed some drainage from the plantar surface of her right foot towards the heel.  She presented to [facility] as her primary care physician told her to go to the emergency room.  She was started on vancomycin and Zosyn.  An MRI of the right foot reveals acute osteomyelitis of the right second toe.  There is nonspecific edema of the soft tissue in the foot, but no drainable abscess is present.  Of note is the fact the patient is followed by podiatry.  She has been receiving oral antibiotics prior to hospitalization for cellulitis of the right foot.  Past Medical History:  Diagnosis Date  . Back pain   . Cellulitis   . Chronic GERD   . Diabetes mellitus without complication (HCC)   . Hypertension       No family history on file.  Social History:  has no history on file for tobacco use, alcohol use, and drug use.  Allergies: No Known Allergies  Medications: I have reviewed the patient's current medications.  Results for orders placed or performed during the hospital encounter of 01/25/21 (from the past 48 hour(s))  CBC with Differential     Status: Abnormal   Collection Time: 01/25/21  5:40 PM  Result Value Ref Range   WBC 10.5 4.0 - 10.5 K/uL   RBC 4.67 3.87 - 5.11 MIL/uL   Hemoglobin 13.0 12.0 - 15.0 g/dL   HCT 16.1 09.6 - 04.5 %   MCV 88.0 80.0 - 100.0 fL   MCH 27.8 26.0 - 34.0 pg   MCHC 31.6 30.0 - 36.0 g/dL   RDW 40.9 81.1 - 91.4 %   Platelets 399 150 - 400 K/uL   nRBC 0.0 0.0 - 0.2 %   Neutrophils Relative % 76 %   Neutro Abs 8.1 (H) 1.7 - 7.7 K/uL   Lymphocytes Relative 10 %   Lymphs Abs 1.1 0.7 - 4.0 K/uL   Monocytes Relative 11  %   Monocytes Absolute 1.1 (H) 0.1 - 1.0 K/uL   Eosinophils Relative 1 %   Eosinophils Absolute 0.1 0.0 - 0.5 K/uL   Basophils Relative 1 %   Basophils Absolute 0.1 0.0 - 0.1 K/uL   Immature Granulocytes 1 %   Abs Immature Granulocytes 0.07 0.00 - 0.07 K/uL    Comment: Performed at [facility], [address], [place], [place] [address]  Comprehensive metabolic panel     Status: Abnormal   Collection Time: 01/25/21  5:40 PM  Result Value Ref Range   Sodium 135 135 - 145 mmol/L   Potassium 4.5 3.5 - 5.1 mmol/L   Chloride 100 98 - 111 mmol/L   CO2 26 22 - 32 mmol/L   Glucose, Bld 160 (H) 70 - 99 mg/dL    Comment: Glucose reference range applies only to samples taken after fasting for at least 8 hours.   BUN 17 6 - 20 mg/dL   Creatinine, Ser 6.21 (L) 0.44 - 1.00 mg/dL   Calcium 9.0 8.9 - 30.8 mg/dL   Total Protein 7.8 6.5 - 8.1 g/dL   Albumin 3.6 3.5 - 5.0 g/dL   AST 14 (L) 15 -  41 U/L   ALT 15 0 - 44 U/L   Alkaline Phosphatase 88 38 - 126 U/L   Total Bilirubin 0.7 0.3 - 1.2 mg/dL   GFR, Estimated >09>60 >81>60 mL/min    Comment: (NOTE) Calculated using the CKD-EPI Creatinine Equation (2021)    Anion gap 9 5 - 15    Comment: Performed at [facility], [address] [place], [place] [address]  Lactic acid, plasma     Status: None   Collection Time: 01/25/21  5:42 PM  Result Value Ref Range   Lactic Acid, Venous 1.3 0.5 - 1.9 mmol/L    Comment: Performed at [facility], [address] [place], [place] [address]  Hemoglobin A1c     Status: Abnormal   Collection Time: 01/25/21  5:50 PM  Result Value Ref Range   Hgb A1c MFr Bld 7.4 (H) 4.8 - 5.6 %    Comment: (NOTE) Pre diabetes:          5.7%-6.4%  Diabetes:              >6.4%  Glycemic control for   <7.0% adults with diabetes    Mean Plasma Glucose 165.68 mg/dL    Comment: Performed at [facility], [address] [place], [place] [address]  Lactic acid, plasma     Status: None   Collection Time: 01/25/21  7:57 PM   Result Value Ref Range   Lactic Acid, Venous 0.8 0.5 - 1.9 mmol/L    Comment: Performed at [facility], [address] [place], [place] [address]  Resp Panel by RT-PCR (Flu A&B, Covid) Nasopharyngeal Swab     Status: None   Collection Time: 01/25/21  8:33 PM   Specimen: Nasopharyngeal Swab; Nasopharyngeal(NP) swabs in vial transport medium  Result Value Ref Range   SARS Coronavirus 2 by RT PCR NEGATIVE NEGATIVE    Comment: (NOTE) SARS-CoV-2 target nucleic acids are NOT DETECTED.  The SARS-CoV-2 RNA is generally detectable in upper respiratory specimens during the acute phase of infection. The lowest concentration of SARS-CoV-2 viral copies this assay can detect is 138 copies/mL. A negative result does not preclude SARS-Cov-2 infection and should not be used as the sole basis for treatment or other patient management decisions. A negative result may occur with  improper specimen collection/handling, submission of specimen other than nasopharyngeal swab, presence of viral mutation(s) within the areas targeted by this assay, and inadequate number of viral copies(<138 copies/mL). A negative result must be combined with clinical observations, patient history, and epidemiological information. The expected result is Negative.  Fact Sheet for Patients:  BloggerCourse.comhttps://www.fda.gov/media/152166/download  Fact Sheet for Healthcare Providers:  SeriousBroker.ithttps://www.fda.gov/media/152162/download  This test is no t yet approved or cleared by the [place] FDA and  has been authorized for detection and/or diagnosis of SARS-CoV-2 by FDA under an Emergency Use Authorization (EUA). This EUA will remain  in effect (meaning this test can be used) for the duration of the COVID-19 declaration under Section 564(b)(1) of the Act, 21 U.S.C.section 360bbb-3(b)(1), unless the authorization is terminated  or revoked sooner.       Influenza A by PCR NEGATIVE NEGATIVE   Influenza B by PCR NEGATIVE NEGATIVE     Comment: (NOTE) The Xpert Xpress SARS-CoV-2/FLU/RSV plus assay is intended as an aid in the diagnosis of influenza from Nasopharyngeal swab specimens and should not be used as a sole basis for treatment. Nasal washings and aspirates are unacceptable for Xpert Xpress SARS-CoV-2/FLU/RSV testing.  Fact Sheet for  Patients: BloggerCourse.com  Fact Sheet for Healthcare Providers: SeriousBroker.it  This test is not yet approved or cleared by the [place] FDA and has been authorized for detection and/or diagnosis of SARS-CoV-2 by FDA under an Emergency Use Authorization (EUA). This EUA will remain in effect (meaning this test can be used) for the duration of the COVID-19 declaration under Section 564(b)(1) of the Act, 21 U.S.C. section 360bbb-3(b)(1), unless the authorization is terminated or revoked.  Performed at [facility], [address], [place], [place] [address]   Glucose, capillary     Status: Abnormal   Collection Time: 01/26/21  4:14 AM  Result Value Ref Range   Glucose-Capillary 134 (H) 70 - 99 mg/dL    Comment: Glucose reference range applies only to samples taken after fasting for at least 8 hours.  HIV Antibody (routine testing w rflx)     Status: None   Collection Time: 01/26/21  4:30 AM  Result Value Ref Range   HIV Screen 4th Generation wRfx Non Reactive Non Reactive    Comment: Performed at [facility], [address], [place], [place] [address]  Comprehensive metabolic panel     Status: Abnormal   Collection Time: 01/26/21  4:30 AM  Result Value Ref Range   Sodium 136 135 - 145 mmol/L   Potassium 3.6 3.5 - 5.1 mmol/L    Comment: DELTA CHECK NOTED   Chloride 100 98 - 111 mmol/L   CO2 28 22 - 32 mmol/L   Glucose, Bld 138 (H) 70 - 99 mg/dL    Comment: Glucose reference range applies only to samples taken after fasting for at least 8 hours.   BUN 14 6 - 20 mg/dL   Creatinine, Ser 6.94 (L) 0.44 - 1.00  mg/dL   Calcium 8.8 (L) 8.9 - 10.3 mg/dL   Total Protein 6.9 6.5 - 8.1 g/dL   Albumin 3.2 (L) 3.5 - 5.0 g/dL   AST 12 (L) 15 - 41 U/L   ALT 13 0 - 44 U/L   Alkaline Phosphatase 79 38 - 126 U/L   Total Bilirubin 0.8 0.3 - 1.2 mg/dL   GFR, Estimated >50 >38 mL/min    Comment: (NOTE) Calculated using the CKD-EPI Creatinine Equation (2021)    Anion gap 8 5 - 15    Comment: Performed at [facility], [address] [place], [place] [address]  CBC     Status: None   Collection Time: 01/26/21  4:30 AM  Result Value Ref Range   WBC 9.3 4.0 - 10.5 K/uL   RBC 4.50 3.87 - 5.11 MIL/uL   Hemoglobin 12.6 12.0 - 15.0 g/dL   HCT 03.4 91.7 - 91.5 %   MCV 88.2 80.0 - 100.0 fL   MCH 28.0 26.0 - 34.0 pg   MCHC 31.7 30.0 - 36.0 g/dL   RDW 05.6 97.9 - 48.0 %   Platelets 390 150 - 400 K/uL   nRBC 0.0 0.0 - 0.2 %    Comment: Performed at [facility], [address], [place], [place] [address]  Protime-INR     Status: None   Collection Time: 01/26/21  4:30 AM  Result Value Ref Range   Prothrombin Time 14.4 11.4 - 15.2 seconds   INR 1.1 0.8 - 1.2    Comment: (NOTE) INR goal varies based on device and disease states. Performed at [facility], [address] [place], [place] [address]   APTT     Status: None   Collection Time:  01/26/21  4:30 AM  Result Value Ref Range   aPTT 31 24 - 36 seconds    Comment: Performed at [facility], [address] [place], [place] [address]  Magnesium     Status: None   Collection Time: 01/26/21  4:30 AM  Result Value Ref Range   Magnesium 1.9 1.7 - 2.4 mg/dL    Comment: Performed at [facility], [address] [place], [place] [address]  Phosphorus     Status: None   Collection Time: 01/26/21  4:30 AM  Result Value Ref Range   Phosphorus 3.8 2.5 - 4.6 mg/dL    Comment: Performed at [facility], [address], [place], [place] [address]  Sedimentation rate     Status: Abnormal   Collection Time: 01/26/21  4:30 AM  Result Value Ref Range   Sed Rate 50 (H)  0 - 22 mm/hr    Comment: Performed at [facility], [address] [place], [place] [address]  Glucose, capillary     Status: Abnormal   Collection Time: 01/26/21  7:49 AM  Result Value Ref Range   Glucose-Capillary 126 (H) 70 - 99 mg/dL    Comment: Glucose reference range applies only to samples taken after fasting for at least 8 hours.  Glucose, capillary     Status: Abnormal   Collection Time: 01/26/21 11:07 AM  Result Value Ref Range   Glucose-Capillary 184 (H) 70 - 99 mg/dL    Comment: Glucose reference range applies only to samples taken after fasting for at least 8 hours.  Glucose, capillary     Status: Abnormal   Collection Time: 01/26/21  4:21 PM  Result Value Ref Range   Glucose-Capillary 169 (H) 70 - 99 mg/dL    Comment: Glucose reference range applies only to samples taken after fasting for at least 8 hours.  Glucose, capillary     Status: Abnormal   Collection Time: 01/26/21  9:18 PM  Result Value Ref Range   Glucose-Capillary 197 (H) 70 - 99 mg/dL    Comment: Glucose reference range applies only to samples taken after fasting for at least 8 hours.   Comment 1 Notify RN    Comment 2 Document in Chart   C-reactive protein     Status: Abnormal   Collection Time: 01/27/21  4:27 AM  Result Value Ref Range   CRP 6.3 (H) <1.0 mg/dL    Comment: Performed at [facility], [address] [place], [place] [address]  CBC     Status: Abnormal   Collection Time: 01/27/21  4:27 AM  Result Value Ref Range   WBC 8.4 4.0 - 10.5 K/uL   RBC 4.73 3.87 - 5.11 MIL/uL   Hemoglobin 13.0 12.0 - 15.0 g/dL   HCT 81.8 56.3 - 14.9 %   MCV 87.3 80.0 - 100.0 fL   MCH 27.5 26.0 - 34.0 pg   MCHC 31.5 30.0 - 36.0 g/dL   RDW 70.2 63.7 - 85.8 %   Platelets 416 (H) 150 - 400 K/uL   nRBC 0.0 0.0 - 0.2 %    Comment: Performed at [facility], [address], [place], [place] [address]  Basic metabolic panel     Status: Abnormal   Collection Time: 01/27/21  4:27 AM  Result Value Ref Range   Sodium  134 (L) 135 - 145 mmol/L   Potassium 3.8 3.5 - 5.1 mmol/L   Chloride 100 98 - 111 mmol/L   CO2 26 22 - 32 mmol/L  Glucose, Bld 190 (H) 70 - 99 mg/dL    Comment: Glucose reference range applies only to samples taken after fasting for at least 8 hours.   BUN 17 6 - 20 mg/dL   Creatinine, Ser 1.30 0.44 - 1.00 mg/dL   Calcium 8.8 (L) 8.9 - 10.3 mg/dL   GFR, Estimated >86 >57 mL/min    Comment: (NOTE) Calculated using the CKD-EPI Creatinine Equation (2021)    Anion gap 8 5 - 15    Comment: Performed at [facility], [address] [place], [place] [address]  Magnesium     Status: None   Collection Time: 01/27/21  4:27 AM  Result Value Ref Range   Magnesium 1.9 1.7 - 2.4 mg/dL    Comment: Performed at [facility], [address], [place], [place] [address]  Glucose, capillary     Status: Abnormal   Collection Time: 01/27/21  7:42 AM  Result Value Ref Range   Glucose-Capillary 181 (H) 70 - 99 mg/dL    Comment: Glucose reference range applies only to samples taken after fasting for at least 8 hours.    MR FOOT RIGHT WO CONTRAST  Result Date: 01/26/2021 CLINICAL DATA:  Diabetic foot swelling and infection EXAM: MRI OF THE RIGHT FOREFOOT WITHOUT CONTRAST TECHNIQUE: Multiplanar, multisequence MR imaging of the right forefoot was performed. No intravenous contrast was administered. COMPARISON:  X-ray 01/25/2021 FINDINGS: Bones/Joint/Cartilage Bone marrow edema throughout the distal phalanx of the right second toe with associated confluent low T1 marrow signal compatible with acute osteomyelitis (series 1,015 and 1,023, image 3). Bone marrow signal within the proximal and middle phalanx of the second toe is maintained. Mild bone marrow edema within the great toe distal phalanx without associated low T1 signal changes (series 1021, image 14). Remaining visualized osseous structures are intact. No fracture. No dislocation. Susceptibility artifact related to distal tibial and fibular hardware. No joint  effusions. Ligaments Intact Lisfranc ligament. Collateral ligaments of the forefoot appear intact. Muscles and Tendons Denervation changes of the intrinsic foot musculature. Flexor and extensor tendons remain intact. Small amount of tenosynovial fluid located within the plantar foot proximally at the level of the knot of Henry. Soft tissues Marked dorsal subcutaneous edema throughout the foot and visualized ankle. No organized or drainable fluid collection. There is more focal soft tissue swelling of the second toe. IMPRESSION: 1. Acute osteomyelitis of the distal phalanx of the right second toe. 2. Mild bone marrow edema within the great toe distal phalanx favored to represent reactive osteitis. 3. Marked dorsal subcutaneous edema throughout the foot and visualized ankle. No organized or drainable fluid collection. 4. Small amount of tenosynovial fluid located within the plantar foot proximally at the level of the knot of Henry. Electronically Signed   By: Duanne Guess D.O.   On: 01/26/2021 15:34   DG Foot Complete Right  Result Date: 01/25/2021 CLINICAL DATA:  Foot pain, drainage.  Question osteomyelitis. EXAM: RIGHT FOOT COMPLETE - 3+ VIEW COMPARISON:  None. FINDINGS: Hardware noted in the distal tibia and fibula. Marked soft tissue swelling in the distal calf and ankle region. No acute bony abnormality. Specifically, no fracture, subluxation, or dislocation. Plantar calcaneal spur. No bone destruction to suggest osteomyelitis. IMPRESSION: No evidence of osteomyelitis within the foot. Soft tissue swelling in the lower calf/ankle. Electronically Signed   By: Charlett Nose M.D.   On: 01/25/2021 17:33    ROS:  Pertinent items are noted in HPI.  Blood pressure 125/74, pulse 76, temperature 98.4 F (36.9  C), temperature source Oral, resp. rate 18, height 5\' 5"  (1.651 m), SpO2 97 %. Physical Exam: Pleasant morbidly obese white female no acute distress Lungs clear to auscultation with good breath sounds  bilaterally Heart examination reveals a regular rate and rhythm without S3, S4, murmurs Bilateral extremity examination is limited due to lymphedema.  The right second toe is erythematous and cellulitic.  No drainage is noted.  Along the plantar surface of the right foot, a healing draining area with cellulitis is present.  The skin for the most part is intact.  A palpable dorsalis pedis pulse was easily found.   Media Information         Document Information  Photos    01/26/2021 10:42  Attached To:  Hospital Encounter on 01/25/21   Source Information  01/27/21, MD  [facility]    Media Information         Document Information  Photos    01/26/2021 10:41  Attached To:  Hospital Encounter on 01/25/21   Source Information  01/27/21, MD  [facility]     Assessment/Plan: Impression: Acute osteomyelitis of right second toe, healing soft tissue infection along plantar surface of right foot.  Patient's white blood cell count was within normal limits as of 01/27/2021. Plan: No need for acute surgical intervention at this time.  Would recommend 6 weeks of IV antibiotics.  She may follow-up with the wound care center as well as podiatry and general surgery in [facility].  Discussed with Dr. [facility].  Arbutus Leas 01/27/2021, 8:08 AM

## 2021-01-27 NOTE — Progress Notes (Signed)
RN aware to notify SWOT RN re PICC placement vs [facility].

## 2021-01-27 NOTE — Progress Notes (Signed)
Peripherally Inserted Central Catheter Placement  The IV Nurse has discussed with the patient and/or persons authorized to consent for the patient, the purpose of this procedure and the potential benefits and risks involved with this procedure.  The benefits include less needle sticks, lab draws from the catheter, and the patient may be discharged home with the catheter. Risks include, but not limited to, infection, bleeding, blood clot (thrombus formation), and puncture of an artery; nerve damage and irregular heartbeat and possibility to perform a PICC exchange if needed/ordered by physician.  Alternatives to this procedure were also discussed.  Bard Power PICC patient education guide, fact sheet on infection prevention and patient information card has been provided to patient /or left at bedside.    PICC Placement Documentation  PICC Single Lumen 01/27/21 Right Basilic 44 cm 0 cm (Active)  Indication for Insertion or Continuance of Line Home intravenous therapies (PICC only) 01/27/21 1225  Exposed Catheter (cm) 0 cm 01/27/21 1225  Site Assessment Clean;Dry;Intact 01/27/21 1225  Line Status Flushed;Saline locked;Blood return noted 01/27/21 1225  Dressing Type Transparent;Securing device 01/27/21 1225  Dressing Status Clean;Dry;Intact 01/27/21 1225  Antimicrobial disc in place? Yes 01/27/21 1225  Safety Lock Not Applicable 01/27/21 1225  Line Care Connections checked and tightened 01/27/21 1225  Dressing Intervention New dressing 01/27/21 1225  Dressing Change Due 02/03/21 01/27/21 1225       Bing Plume 01/27/2021, 12:36 PM

## 2021-01-27 NOTE — Progress Notes (Signed)
PROGRESS NOTE  [name] STM:[id number] DOB: 11-20-1965 DOA: 01/25/2021 PCP: Neale Burly, MD  Brief History:  55 year old female with a history of diabetes mellitus type 2, hypertension, GERD, anxiety/depression, morbid obesity, panniculitis presenting with right foot pain and drainage.  The patient states that she received an injection on the plantar surface of her right foot around 12/06/2020 from her podiatrist for plantar fasciitis.  A couple weeks later, she noted some pain and drainage on the foot and went back to see her podiatrist.  At that time, the patient was sent to the wound care center at [facility], and she has followed up there for continued wound care since then.  Patient has been essentially been on continuous oral antibiotics for the last 4-6 weeks secondary to her right foot wound.  The patient was initially started on a 10-day course of Augmentin.  This was followed by a course of doxycycline which was followed by a second course of doxycycline for 14 days.  Apparently, notation was made that the she had some cellulitis in the right lower extremity calf area which was improving, but she continued to have some drainage on the plantar surface of her foot.  She went to see her PCP on 01/25/2021.  Because of concerns of the wound, the patient was instructed to go to the emergency department for further evaluation.  In addition, the patient noted a wound about her right second toe approximately 3 to 4 weeks ago.  She thought this was secondary to ill fitting shoes.  Since then, she has developed increasing redness and drainage on the second toe.  She denies any fevers, chills, headache, chest pain, shortness breath, coughing, hemoptysis, nausea, vomiting, diarrhea. Review of the medical records also shows that the patient has been struggling with panniculitis and a lipomatous wound on her right medial thigh.  She was subsequently sent to see plastic surgery at the [facility] on  01/01/21.  Plans are in the works for resection of the thigh wound as well as a panniculectomy, but the surgeon wanted the patient to lose weight to minimize complications from surgery. In the emergency department, the patient had low-grade temperature 9 9.4 F.  She was hemodynamically stable with oxygen saturation 100% room air.  BMP showed a serum creatinine 0.42 with unremarkable electrolytes.  LFTs were unremarkable.  WBC 10.5, hemoglobin 13.0, platelets 390,000.  Lactate peaked 1.3.   The patient was started on vancomycin and Zosyn and admitted for further evaluation and treatment.  Assessment/Plan: Right diabetic foot infection -Check CRP--6.3 -Check ESR--50 -MRI right foot--Acute osteomyelitis of the distal phalanx of the right second toe. -Continue vancomycin  -switch zosyn to ceftriaxone -There was a superficial wound culture at [facility] with MRSA and Corynebacterium -general surgery consult appreciated--discussed with Dr. Arnoldo Morale -Continue hydrocodone for pain -patient does not want amputation/surgery at this time--she wants trial of course of abx -d/c home with ceftriaxone and vanco IV--last day on 03/08/21 -PICC line placed 01/27/21  Cellulitis right lower extremity/venous stasis dermatitis -Wound care consult appreciated -Please see photographs 01/26/21  Diabetes mellitus type 2 -01/25/21 Hemoglobin A1c--7.4 -NovoLog sliding scale -Holding Jardiance and sitagliptin/metformin  Essential hypertension -holding lisinopril temporarily -Blood pressure is well controlled presently  Morbid obesity -BMI 61 -Lifestyle modification  Depression/anxiety -Continue Abilify and Lexapro  GERD -Continue Protonix       Status is: Inpatient  Remains inpatient appropriate because:IV treatments appropriate due to intensity of illness or inability  to take PO   Dispo: The patient is from: Home  Anticipated d/c is to: Home  Patient currently is  not medically stable to d/c.              Difficult to place patient No        Family Communication:   No Family at bedside  Consultants:  General surgery  Code Status:  FULL  DVT Prophylaxis:  [place] Lovenox   Procedures: As Listed in Progress Note Above  Antibiotics: vanco 4/21>> Zosyn 4/21>>4/23 Ceftriaxone 4/23>>>     Subjective: Patient denies fevers, chills, headache, chest pain, dyspnea, nausea, vomiting, diarrhea, abdominal pain, dysuria, hematuria, hematochezia, and melena. Pain is controlled in the foot and heel   Objective: Vitals:   01/26/21 0418 01/26/21 1405 01/26/21 2120 01/27/21 0512  BP: 125/78 120/74 109/67 125/74  Pulse: 85 80 78 76  Resp: '16 16 18 18  ' Temp: 98 F (36.7 C) 98.4 F (36.9 C) 98.3 F (36.8 C) 98.4 F (36.9 C)  TempSrc: Oral Oral Oral Oral  SpO2: 100% 96% 100% 97%  Height:        Intake/Output Summary (Last 24 hours) at 01/27/2021 1448 Last data filed at 01/27/2021 0700 Gross per 24 hour  Intake 1235.48 ml  Output --  Net 1235.48 ml   Weight change:  Exam:   General:  Pt is alert, follows commands appropriately, not in acute distress  HEENT: No icterus, No thrush, No neck mass, [place]/AT  Cardiovascular: RRR, S1/S2, no rubs, no gallops  Respiratory: CTA bilaterally, no wheezing, no crackles, no rhonchi  Abdomen: Soft/+BS, non tender, non distended, no guarding  Extremities: erythema on R-second toe with dry eschar on tip.  No crepitance.  No pus drainage.  +erythema of right pretibial area without pus or necrosis   Data Reviewed: I have personally reviewed following labs and imaging studies Basic Metabolic Panel: Recent Labs  Lab 01/25/21 1740 01/26/21 0430 01/27/21 0427  NA 135 136 134*  K 4.5 3.6 3.8  CL 100 100 100  CO2 '26 28 26  ' GLUCOSE 160* 138* 190*  BUN '17 14 17  ' CREATININE 0.42* 0.37* 0.46  CALCIUM 9.0 8.8* 8.8*  MG  --  1.9 1.9  PHOS  --  3.8  --    Liver Function Tests: Recent  Labs  Lab 01/25/21 1740 01/26/21 0430  AST 14* 12*  ALT 15 13  ALKPHOS 88 79  BILITOT 0.7 0.8  PROT 7.8 6.9  ALBUMIN 3.6 3.2*   No results for input(s): LIPASE, AMYLASE in the last 168 hours. No results for input(s): AMMONIA in the last 168 hours. Coagulation Profile: Recent Labs  Lab 01/26/21 0430  INR 1.1   CBC: Recent Labs  Lab 01/25/21 1740 01/26/21 0430 01/27/21 0427  WBC 10.5 9.3 8.4  NEUTROABS 8.1*  --   --   HGB 13.0 12.6 13.0  HCT 41.1 39.7 41.3  MCV 88.0 88.2 87.3  PLT 399 390 416*   Cardiac Enzymes: No results for input(s): CKTOTAL, CKMB, CKMBINDEX, TROPONINI in the last 168 hours. BNP: Invalid input(s): POCBNP CBG: Recent Labs  Lab 01/26/21 1107 01/26/21 1621 01/26/21 2118 01/27/21 0742 01/27/21 1257  GLUCAP 184* 169* 197* 181* 169*   HbA1C: Recent Labs    01/25/21 1750  HGBA1C 7.4*   Urine analysis: No results found for: COLORURINE, APPEARANCEUR, LABSPEC, PHURINE, GLUCOSEU, HGBUR, BILIRUBINUR, KETONESUR, PROTEINUR, UROBILINOGEN, NITRITE, LEUKOCYTESUR Sepsis Labs: '@LABRCNTIP' (procalcitonin:4,lacticidven:4) ) Recent Results (from the past 240 hour(s))  Resp Panel  by RT-PCR (Flu A&B, Covid) Nasopharyngeal Swab     Status: None   Collection Time: 01/25/21  8:33 PM   Specimen: Nasopharyngeal Swab; Nasopharyngeal(NP) swabs in vial transport medium  Result Value Ref Range Status   SARS Coronavirus 2 by RT PCR NEGATIVE NEGATIVE Final    Comment: (NOTE) SARS-CoV-2 target nucleic acids are NOT DETECTED.  The SARS-CoV-2 RNA is generally detectable in upper respiratory specimens during the acute phase of infection. The lowest concentration of SARS-CoV-2 viral copies this assay can detect is 138 copies/mL. A negative result does not preclude SARS-Cov-2 infection and should not be used as the sole basis for treatment or other patient management decisions. A negative result may occur with  improper specimen collection/handling, submission of  specimen other than nasopharyngeal swab, presence of viral mutation(s) within the areas targeted by this assay, and inadequate number of viral copies(<138 copies/mL). A negative result must be combined with clinical observations, patient history, and epidemiological information. The expected result is Negative.  Fact Sheet for Patients:  EntrepreneurPulse.com.au  Fact Sheet for Healthcare Providers:  IncredibleEmployment.be  This test is no t yet approved or cleared by the [place] FDA and  has been authorized for detection and/or diagnosis of SARS-CoV-2 by FDA under an Emergency Use Authorization (EUA). This EUA will remain  in effect (meaning this test can be used) for the duration of the COVID-19 declaration under Section 564(b)(1) of the Act, 21 U.S.C.section 360bbb-3(b)(1), unless the authorization is terminated  or revoked sooner.       Influenza A by PCR NEGATIVE NEGATIVE Final   Influenza B by PCR NEGATIVE NEGATIVE Final    Comment: (NOTE) The Xpert Xpress SARS-CoV-2/FLU/RSV plus assay is intended as an aid in the diagnosis of influenza from Nasopharyngeal swab specimens and should not be used as a sole basis for treatment. Nasal washings and aspirates are unacceptable for Xpert Xpress SARS-CoV-2/FLU/RSV testing.  Fact Sheet for Patients: EntrepreneurPulse.com.au  Fact Sheet for Healthcare Providers: IncredibleEmployment.be  This test is not yet approved or cleared by the [place] FDA and has been authorized for detection and/or diagnosis of SARS-CoV-2 by FDA under an Emergency Use Authorization (EUA). This EUA will remain in effect (meaning this test can be used) for the duration of the COVID-19 declaration under Section 564(b)(1) of the Act, 21 U.S.C. section 360bbb-3(b)(1), unless the authorization is terminated or revoked.  Performed at [facility], [address],  [place], [place] [address]      Scheduled Meds: . ARIPiprazole  5 mg Oral Daily  . Chlorhexidine Gluconate Cloth  6 each Topical Daily  . escitalopram  20 mg Oral Daily  . insulin aspart  0-15 Units Subcutaneous TID WC  . multivitamin with minerals  1 tablet Oral Daily  . omega-3 acid ethyl esters  2,000 mg Oral Daily  . pantoprazole  40 mg Oral Daily  . sodium chloride flush  10-40 mL Intracatheter Q12H   Continuous Infusions: . piperacillin-tazobactam (ZOSYN)  IV 3.375 g (01/27/21 1306)  . vancomycin 1,750 mg (01/27/21 1319)    Procedures/Studies: MR FOOT RIGHT WO CONTRAST  Result Date: 01/26/2021 CLINICAL DATA:  Diabetic foot swelling and infection EXAM: MRI OF THE RIGHT FOREFOOT WITHOUT CONTRAST TECHNIQUE: Multiplanar, multisequence MR imaging of the right forefoot was performed. No intravenous contrast was administered. COMPARISON:  X-ray 01/25/2021 FINDINGS: Bones/Joint/Cartilage Bone marrow edema throughout the distal phalanx of the right second toe with associated confluent low T1 marrow signal compatible with acute osteomyelitis (series 1,015 and 1,023,  image 3). Bone marrow signal within the proximal and middle phalanx of the second toe is maintained. Mild bone marrow edema within the great toe distal phalanx without associated low T1 signal changes (series 1021, image 14). Remaining visualized osseous structures are intact. No fracture. No dislocation. Susceptibility artifact related to distal tibial and fibular hardware. No joint effusions. Ligaments Intact Lisfranc ligament. Collateral ligaments of the forefoot appear intact. Muscles and Tendons Denervation changes of the intrinsic foot musculature. Flexor and extensor tendons remain intact. Small amount of tenosynovial fluid located within the plantar foot proximally at the level of the knot of Henry. Soft tissues Marked dorsal subcutaneous edema throughout the foot and visualized ankle. No organized or drainable fluid collection. There  is more focal soft tissue swelling of the second toe. IMPRESSION: 1. Acute osteomyelitis of the distal phalanx of the right second toe. 2. Mild bone marrow edema within the great toe distal phalanx favored to represent reactive osteitis. 3. Marked dorsal subcutaneous edema throughout the foot and visualized ankle. No organized or drainable fluid collection. 4. Small amount of tenosynovial fluid located within the plantar foot proximally at the level of the knot of Henry. Electronically Signed   By: Davina Poke D.O.   On: 01/26/2021 15:34   DG Foot Complete Right  Result Date: 01/25/2021 CLINICAL DATA:  Foot pain, drainage.  Question osteomyelitis. EXAM: RIGHT FOOT COMPLETE - 3+ VIEW COMPARISON:  None. FINDINGS: Hardware noted in the distal tibia and fibula. Marked soft tissue swelling in the distal calf and ankle region. No acute bony abnormality. Specifically, no fracture, subluxation, or dislocation. Plantar calcaneal spur. No bone destruction to suggest osteomyelitis. IMPRESSION: No evidence of osteomyelitis within the foot. Soft tissue swelling in the lower calf/ankle. Electronically Signed   By: Rolm Baptise M.D.   On: 01/25/2021 17:33   [place] EKG SITE RITE  Result Date: 01/27/2021 If Site Rite image not attached, placement could not be confirmed due to current cardiac rhythm.   Orson Eva, DO  [facility]  If 7PM-7AM, please contact night-coverage [email or web address] Password [facility] 01/27/2021, 2:48 PM   LOS: 2 days

## 2021-01-28 ENCOUNTER — Encounter (HOSPITAL_COMMUNITY): Payer: Self-pay | Admitting: Internal Medicine

## 2021-01-28 DIAGNOSIS — L089 Local infection of the skin and subcutaneous tissue, unspecified: Secondary | ICD-10-CM | POA: Diagnosis not present

## 2021-01-28 DIAGNOSIS — M869 Osteomyelitis, unspecified: Secondary | ICD-10-CM | POA: Diagnosis not present

## 2021-01-28 DIAGNOSIS — E11628 Type 2 diabetes mellitus with other skin complications: Secondary | ICD-10-CM | POA: Diagnosis not present

## 2021-01-28 DIAGNOSIS — L97412 Non-pressure chronic ulcer of right heel and midfoot with fat layer exposed: Secondary | ICD-10-CM | POA: Diagnosis not present

## 2021-01-28 DIAGNOSIS — L97514 Non-pressure chronic ulcer of other part of right foot with necrosis of bone: Secondary | ICD-10-CM | POA: Diagnosis not present

## 2021-01-28 DIAGNOSIS — L03115 Cellulitis of right lower limb: Secondary | ICD-10-CM | POA: Diagnosis not present

## 2021-01-28 DIAGNOSIS — T148XXA Other injury of unspecified body region, initial encounter: Secondary | ICD-10-CM | POA: Diagnosis not present

## 2021-01-28 LAB — GLUCOSE, CAPILLARY
Glucose-Capillary: 159 mg/dL — ABNORMAL HIGH (ref 70–99)
Glucose-Capillary: 159 mg/dL — ABNORMAL HIGH (ref 70–99)

## 2021-01-28 MED ORDER — SODIUM CHLORIDE 0.9 % IV SOLN: 2.0000 g | INTRAVENOUS | Status: DC

## 2021-01-28 MED ORDER — OXYCODONE HCL 5 MG PO TABS: 5.0000 mg | ORAL_TABLET | Freq: Four times a day (QID) | ORAL | 0 refills | Status: DC | PRN

## 2021-01-28 MED ORDER — VANCOMYCIN IV (FOR PTA / DISCHARGE USE ONLY): 1750.0000 mg | Freq: Two times a day (BID) | INTRAVENOUS | 0 refills | Status: AC

## 2021-01-28 MED ORDER — CEFTRIAXONE IV (FOR PTA / DISCHARGE USE ONLY): 2.0000 g | INTRAVENOUS | 0 refills | Status: AC

## 2021-01-28 MED ORDER — VANCOMYCIN HCL 1750 MG/350ML IV SOLN: 1750.0000 mg | Freq: Two times a day (BID) | INTRAVENOUS | Status: DC

## 2021-01-28 MED ORDER — OXYCODONE HCL 5 MG PO TABS: 5.0000 mg | ORAL_TABLET | Freq: Four times a day (QID) | ORAL | Status: DC | PRN

## 2021-01-28 NOTE — Progress Notes (Signed)
Pt discharged with PICC in place for home IV antibiotics.

## 2021-01-28 NOTE — Progress Notes (Signed)
Subjective: Patient has mild right foot pain.  Objective: Vital signs in last 24 hours: Temp:  [97.5 F (36.4 C)-98.2 F (36.8 C)] 97.7 F (36.5 C) (04/24 0651) Pulse Rate:  [76-90] 76 (04/24 0651) Resp:  [18-19] 19 (04/24 0651) BP: (122-131)/(85-91) 122/85 (04/24 0651) SpO2:  [100 %] 100 % (04/24 0651) Last BM Date: 01/27/21  Intake/Output from previous day: 04/23 0701 - 04/24 0700 In: 1170 [P.O.:720; IV Piggyback:450] Out: -  Intake/Output this shift: No intake/output data recorded.  General appearance: alert, cooperative and no distress Extremities: Right foot with dry skin throughout.  Right second toe with mild erythema and swelling.  The base of the foot close to the heel with desquamation of skin and some serous drainage.  There is some skin loss superficial noted.  Lab Results:  Recent Labs    01/26/21 0430 01/27/21 0427  WBC 9.3 8.4  HGB 12.6 13.0  HCT 39.7 41.3  PLT 390 416*   BMET Recent Labs    01/26/21 0430 01/27/21 0427  NA 136 134*  K 3.6 3.8  CL 100 100  CO2 28 26  GLUCOSE 138* 190*  BUN 14 17  CREATININE 0.37* 0.46  CALCIUM 8.8* 8.8*   PT/INR Recent Labs    01/26/21 0430  LABPROT 14.4  INR 1.1    Studies/Results: MR FOOT RIGHT WO CONTRAST  Result Date: 01/26/2021 CLINICAL DATA:  Diabetic foot swelling and infection EXAM: MRI OF THE RIGHT FOREFOOT WITHOUT CONTRAST TECHNIQUE: Multiplanar, multisequence MR imaging of the right forefoot was performed. No intravenous contrast was administered. COMPARISON:  X-ray 01/25/2021 FINDINGS: Bones/Joint/Cartilage Bone marrow edema throughout the distal phalanx of the right second toe with associated confluent low T1 marrow signal compatible with acute osteomyelitis (series 1,015 and 1,023, image 3). Bone marrow signal within the proximal and middle phalanx of the second toe is maintained. Mild bone marrow edema within the great toe distal phalanx without associated low T1 signal changes (series 1021,  image 14). Remaining visualized osseous structures are intact. No fracture. No dislocation. Susceptibility artifact related to distal tibial and fibular hardware. No joint effusions. Ligaments Intact Lisfranc ligament. Collateral ligaments of the forefoot appear intact. Muscles and Tendons Denervation changes of the intrinsic foot musculature. Flexor and extensor tendons remain intact. Small amount of tenosynovial fluid located within the plantar foot proximally at the level of the knot of Henry. Soft tissues Marked dorsal subcutaneous edema throughout the foot and visualized ankle. No organized or drainable fluid collection. There is more focal soft tissue swelling of the second toe. IMPRESSION: 1. Acute osteomyelitis of the distal phalanx of the right second toe. 2. Mild bone marrow edema within the great toe distal phalanx favored to represent reactive osteitis. 3. Marked dorsal subcutaneous edema throughout the foot and visualized ankle. No organized or drainable fluid collection. 4. Small amount of tenosynovial fluid located within the plantar foot proximally at the level of the knot of Henry. Electronically Signed   By: Duanne Guess D.O.   On: 01/26/2021 15:34   [place] EKG SITE RITE  Result Date: 01/27/2021 If Site Rite image not attached, placement could not be confirmed due to current cardiac rhythm.   Anti-infectives: Anti-infectives (From admission, onward)   Start     Dose/Rate Route Frequency Ordered Stop   01/27/21 1530  cefTRIAXone (ROCEPHIN) 2 g in sodium chloride 0.9 % 100 mL IVPB        2 g 200 mL/hr over 30 Minutes Intravenous Every 24 hours 01/27/21 1459  01/27/21 0000  vancomycin (VANCOREADY) IVPB 1750 mg/350 mL        1,750 mg 175 mL/hr over 120 Minutes Intravenous Every 12 hours 01/26/21 1529     01/26/21 1000  vancomycin (VANCOREADY) IVPB 2000 mg/400 mL  Status:  Discontinued        2,000 mg 200 mL/hr over 120 Minutes Intravenous Every 12 hours 01/26/21 0021 01/26/21 1529    01/26/21 0415  piperacillin-tazobactam (ZOSYN) IVPB 3.375 g  Status:  Discontinued        3.375 g 100 mL/hr over 30 Minutes Intravenous Every 6 hours 01/26/21 0000 01/26/21 0004   01/26/21 0400  piperacillin-tazobactam (ZOSYN) IVPB 3.375 g  Status:  Discontinued        3.375 g 12.5 mL/hr over 240 Minutes Intravenous Every 8 hours 01/26/21 0004 01/27/21 1459   01/26/21 0100  vancomycin (VANCOREADY) IVPB 1500 mg/300 mL        1,500 mg 150 mL/hr over 120 Minutes Intravenous  Once 01/26/21 0021 01/26/21 0749   01/25/21 2015  vancomycin (VANCOCIN) IVPB 1000 mg/200 mL premix        1,000 mg 200 mL/hr over 60 Minutes Intravenous  Once 01/25/21 2007 01/25/21 2215   01/25/21 2015  piperacillin-tazobactam (ZOSYN) IVPB 3.375 g        3.375 g 100 mL/hr over 30 Minutes Intravenous  Once 01/25/21 2007 01/25/21 2106      Assessment/Plan: Impression: Cellulitis of right second toe with acute osteomyelitis, soft tissue cellulitis of right heel. Plan: PICC line has been placed and the patient will be sent home on IV antibiotics.  She already has an appointment to follow-up with Dr. Nolen Mu of Podiatry in 2 days.  She should keep that appointment.  Will sign off.  LOS: 3 days    Franky Macho 01/28/2021

## 2021-01-28 NOTE — TOC Progression Note (Signed)
Transition of Care [facility]) - Progression Note    Patient Details  Name: [name] MRN: [id number] Date of Birth: Jun 23, 1966  Transition of Care [facility]) CM/SW Contact  Maree Krabbe, LCSW Phone Number: 01/28/2021, 10:29 AM  Clinical Narrative:  Referral for home IV antibiotics given to [facility] with [facility]. She will work on processing it.       Barriers to Discharge: Continued Medical Work up  Expected Discharge Plan and Services                                                 Social Determinants of Health (SDOH) Interventions    Readmission Risk Interventions No flowsheet data found.

## 2021-01-28 NOTE — TOC Progression Note (Signed)
Transition of Care [facility]) - Progression Note    Patient Details  Name: [name] MRN: [id number] Date of Birth: [birth year]  Transition of Care [facility]) CM/SW Contact  Maree Krabbe, LCSW Phone Number: 01/28/2021, 11:32 AM  Clinical Narrative:   Jeri Modena with [facility] will complete teaching with pt today for home IV antibiotics. Pt will also get HHRN through [facility].      Barriers to Discharge: Continued Medical Work up  Expected Discharge Plan and Services                                                 Social Determinants of Health (SDOH) Interventions    Readmission Risk Interventions No flowsheet data found.

## 2021-01-28 NOTE — Discharge Summary (Addendum)
Physician Discharge Summary  [name] MRN:[id number] DOB: 02/02/1966 DOA: 01/25/2021  PCP: Hasanaj, Xaje A, MD  Admit date: 01/25/2021 Discharge date: 01/28/2021  Admitted From: Home Disposition:  Home   Recommendations for Outpatient Follow-up:  1. Follow up with PCP in 1-2 weeks 2. Please obtain BMP/CBC,ESR, CRP every 7 days through 03/08/21 3. Ceftriaxone 2 grams daily--last day on 03/08/21 4. Vancomycin 1750 mg q 12 hours--last day on 03/08/21 5. [facility] to monitor and check vancomycin levels per protocol 6. Removed PICC line after last dose of IV abx   Home Health: YES Equipment/Devices: HHRN  Discharge Condition: Stable CODE STATUS: FULL Diet recommendation: Heart Healthy / Carb Modified   Brief/Interim Summary: 55-year-old female with a history of diabetes mellitus type 2, hypertension, GERD, anxiety/depression, morbid obesity, panniculitis presenting with right foot pain and drainage. The patient states that she received an injection on the plantar surface of her right foot around 12/06/2020 from her podiatrist for plantar fasciitis. A couple weeks later, she noted some pain and drainage on the foot and went back to see her podiatrist. At that time, the patient was sent to the wound care center at [facility], and she has followed up there for continued wound care since then. Patient has been essentially been on continuous oral antibiotics for the last 4-6weeks secondary to her right foot wound. The patient was initially started on a 10-day course of Augmentin. This was followed by a course of doxycycline which was followed by a second course of doxycycline for 14 days. Apparently, notation was made that the she had some cellulitis in the right lower extremity calf area which was improving, but she continued to have some drainage on the plantar surface of her foot. She went to see her PCP on 01/25/2021. Because of concerns of the wound, the patient was instructed to  go to the emergency department for further evaluation. In addition, the patient noted a wound about her right second toe approximately 3 to 4 weeks ago. She thought this was secondary to ill fitting shoes. Since then, she has developed increasing redness and drainage on the second toe. She denies any fevers, chills, headache, chest pain, shortness breath, coughing, hemoptysis, nausea, vomiting, diarrhea. Review of the medical records also shows that the patient has been struggling with panniculitis and a lipomatous wound on her right medial thigh. She was subsequently sent to see plastic surgery at the [facility] on 01/01/21.Plans are in the works for resection of the thigh wound as well as a panniculectomy, but the surgeon wanted the patient to lose weight to minimize complications from surgery. In the emergency department, the patient had low-grade temperature 9 9.4 F. Shewas hemodynamically stable with oxygen saturation 100% room air. BMP showed a serum creatinine 0.42 with unremarkable electrolytes. LFTs were unremarkable. WBC 10.5, hemoglobin 13.0, platelets 390,000.Lactate peaked 1.3.The patient was started on vancomycin and Zosyn and admitted for further evaluation and treatment.  Discharge Diagnoses:  Right diabetic foot infection -Check CRP--6.3 -Check ESR--50 -MRI right foot--Acute osteomyelitis of the distal phalanx of the right second toe. -Continue vancomycin  -switch zosyn to ceftriaxone -There was a superficial wound cultureat [facility] with MRSA and Corynebacterium -general surgery consult appreciated--discussed with Dr. Jenkins -Continue oxycodone for pain -patient does not want amputation/surgery at this time--she wants trial of course of abx -d/c home with ceftriaxone and vanco IV--last day on 03/08/21 -PICC line placed 01/27/21 -patient has appointment with podiatry, Dr. McKinney on 01/30/21 which I have encouraged her   to keep  Cellulitis right lower  extremity/venous stasis dermatitis -Wound care consult appreciated -Please see photographs 01/26/21  Diabetes mellitus type 2 -01/25/21 Hemoglobin A1c--7.4 -NovoLog sliding scale -Holding Jardiance and sitagliptin/metformin  Essential hypertension -holding lisinopriltemporarily -Blood pressure is well controlled presently  Morbid obesity -BMI 61 -Lifestyle modification  Depression/anxiety -Continue Abilify and Lexapro  GERD -Continue Protonix   Discharge Instructions  Discharge Instructions    [facility] pharmacist to adjust dose for Vancomycin, Aminoglycosides and other anti-infective therapies as requested by physician.   Complete by: As directed    [facility] to provide Cath Flo 75m   Complete by: As directed    Administer for PICC line occlusion and as ordered by physician for other access device issues.   Anaphylaxis Kit: Provided to treat any anaphylactic reaction to the medication being provided to the patient if First Dose or when requested by physician   Complete by: As directed    Epinephrine 181mml vial / amp: Administer 0.69m60m0.69ml769mubcutaneously once for moderate to severe anaphylaxis, nurse to call physician and pharmacy when reaction occurs and call 911 if needed for immediate care   Diphenhydramine 50mg569mIV vial: Administer 25-50mg 30mM PRN for first dose reaction, rash, itching, mild reaction, nurse to call physician and pharmacy when reaction occurs   Sodium Chloride 0.9% NS 500ml I18mdminister if needed for hypovolemic blood pressure drop or as ordered by physician after call to physician with anaphylactic reaction   Change dressing on IV access line weekly and PRN   Complete by: As directed    Flush IV access with Sodium Chloride 0.9% and Heparin 10 units/ml or 100 units/ml   Complete by: As directed    Home infusion instructions - [facility]   Complete by: As directed    Instructions: Flush IV access with  Sodium Chloride 0.9% and Heparin 10units/ml or 100units/ml   Change dressing on IV access line: Weekly and PRN   Instructions Cath Flo 2mg: Ad62mister for PICC Line occlusion and as ordered by physician for other access device   [facility] pharmacist to adjust dose for: Vancomycin, Aminoglycosides and other anti-infective therapies as requested by physician   Method of administration may be changed at the discretion of home infusion pharmacist based upon assessment of the patient and/or caregiver's ability to self-administer the medication ordered   Complete by: As directed      Allergies as of 01/28/2021   No Known Allergies     Medication List    STOP taking these medications   doxycycline 100 MG tablet Commonly known as: VIBRA-TABS   HYDROcodone-acetaminophen 10-325 MG tablet Commonly known as: NORCO     TAKE these medications   ARIPiprazole 5 MG tablet Commonly known as: ABILIFY Take 5 mg by mouth daily.   cefTRIAXone  IVPB Commonly known as: ROCEPHIN Inject 2 g into the vein daily. Indication:  osteomyelitis First Dose: No Last Day of Therapy:  03/08/2021 Labs - Once weekly:  CBC/D and BMP, Labs - Every other week:  ESR and CRP Method of administration: IV Push Method of administration may be changed at the discretion of home infusion pharmacist based upon assessment of the patient and/or caregiver's ability to self-administer the medication ordered.   escitalopram 20 MG tablet Commonly known as: LEXAPRO Take 1 tablet by mouth daily.   Jardiance 25 MG Tabs tablet Generic drug: empagliflozin Take 25 mg by mouth daily.   ketoconazole 2 % shampoo Commonly known as: NIZORAL  Apply 1 application topically 2 (two) times a week.   lisinopril 20 MG tablet Commonly known as: ZESTRIL Take 1 tablet by mouth daily.   Multi-Vitamin tablet Take 1 tablet by mouth daily.   nystatin powder Commonly known as: MYCOSTATIN/NYSTOP Apply topically 2 (two) times daily.  to affected area(s)   Omega-3 1000 MG Caps Take 2 capsules by mouth daily.   oxyCODONE 5 MG immediate release tablet Commonly known as: Oxy IR/ROXICODONE Take 1 tablet (5 mg total) by mouth every 6 (six) hours as needed for moderate pain.   pantoprazole 40 MG tablet Commonly known as: PROTONIX Take 1 tablet by mouth daily.   sitaGLIPtin-metformin 50-500 MG tablet Commonly known as: JANUMET Take 1 tablet by mouth 2 (two) times daily with a meal.   triamcinolone lotion 0.1 % Commonly known as: KENALOG Apply 1 application topically daily.   vancomycin  IVPB Inject 1,750 mg into the vein every 12 (twelve) hours for 78 doses. Indication:  osteomyelitis First Dose: No Last Day of Therapy:  03/08/2021 Labs - Sunday/Monday:  CBC/D, BMP, and vancomycin trough. Labs - Thursday:  BMP and vancomycin trough Labs - Every other week:  ESR and CRP Method of administration:Elastomeric Method of administration may be changed at the discretion of the patient and/or caregiver's ability to self-administer the medication ordered.            Discharge Care Instructions  (From admission, onward)         Start     Ordered   01/28/21 0000  Change dressing on IV access line weekly and PRN  (Home infusion instructions - [facility] )        01/28/21 1228          No Known Allergies  Consultations:  General surgery   Procedures/Studies: MR FOOT RIGHT WO CONTRAST  Result Date: 01/26/2021 CLINICAL DATA:  Diabetic foot swelling and infection EXAM: MRI OF THE RIGHT FOREFOOT WITHOUT CONTRAST TECHNIQUE: Multiplanar, multisequence MR imaging of the right forefoot was performed. No intravenous contrast was administered. COMPARISON:  X-ray 01/25/2021 FINDINGS: Bones/Joint/Cartilage Bone marrow edema throughout the distal phalanx of the right second toe with associated confluent low T1 marrow signal compatible with acute osteomyelitis (series 1,015 and 1,023, image 3). Bone marrow signal  within the proximal and middle phalanx of the second toe is maintained. Mild bone marrow edema within the great toe distal phalanx without associated low T1 signal changes (series 1021, image 14). Remaining visualized osseous structures are intact. No fracture. No dislocation. Susceptibility artifact related to distal tibial and fibular hardware. No joint effusions. Ligaments Intact Lisfranc ligament. Collateral ligaments of the forefoot appear intact. Muscles and Tendons Denervation changes of the intrinsic foot musculature. Flexor and extensor tendons remain intact. Small amount of tenosynovial fluid located within the plantar foot proximally at the level of the knot of Henry. Soft tissues Marked dorsal subcutaneous edema throughout the foot and visualized ankle. No organized or drainable fluid collection. There is more focal soft tissue swelling of the second toe. IMPRESSION: 1. Acute osteomyelitis of the distal phalanx of the right second toe. 2. Mild bone marrow edema within the great toe distal phalanx favored to represent reactive osteitis. 3. Marked dorsal subcutaneous edema throughout the foot and visualized ankle. No organized or drainable fluid collection. 4. Small amount of tenosynovial fluid located within the plantar foot proximally at the level of the knot of Henry. Electronically Signed   By: Nicholas  Plundo D.O.   On: 01/26/2021 15:34     DG Foot Complete Right  Result Date: 01/25/2021 CLINICAL DATA:  Foot pain, drainage.  Question osteomyelitis. EXAM: RIGHT FOOT COMPLETE - 3+ VIEW COMPARISON:  None. FINDINGS: Hardware noted in the distal tibia and fibula. Marked soft tissue swelling in the distal calf and ankle region. No acute bony abnormality. Specifically, no fracture, subluxation, or dislocation. Plantar calcaneal spur. No bone destruction to suggest osteomyelitis. IMPRESSION: No evidence of osteomyelitis within the foot. Soft tissue swelling in the lower calf/ankle. Electronically Signed    By: Rolm Baptise M.D.   On: 01/25/2021 17:33   [place] EKG SITE RITE  Result Date: 01/27/2021 If Site Rite image not attached, placement could not be confirmed due to current cardiac rhythm.       Discharge Exam: Vitals:   01/27/21 2112 01/28/21 0651  BP: (!) 128/91 122/85  Pulse: 90 76  Resp: 18 19  Temp: (!) 97.5 F (36.4 C) 97.7 F (36.5 C)  SpO2: 100% 100%   Vitals:   01/27/21 0512 01/27/21 1511 01/27/21 2112 01/28/21 0651  BP: 125/74 131/88 (!) 128/91 122/85  Pulse: 76 79 90 76  Resp: _0 Temp: 98.4 F (36.9 C) 98.2 F (36.8 C) (!) 97.5 F (36.4 C) 97.7 F (36.5 C)  TempSrc: Oral Oral Oral Oral  SpO2: 97% 100% 100% 100%  Height:        General: Pt is alert, awake, not in acute distress Cardiovascular: RRR, S1/S2 +, no rubs, no gallops Respiratory: CTA bilaterally, no wheezing, no rhonchi Abdominal: Soft, NT, ND, bowel sounds + Extremities: 1 + RLE edema, no cyanosis; right second toe tip with dry necrosis--no pus.  Mild erythema and swelling or right second toe;  Right heal without necrosis   The results of significant diagnostics from this hospitalization (including imaging, microbiology, ancillary and laboratory) are listed below for reference.    Significant Diagnostic Studies: MR FOOT RIGHT WO CONTRAST  Result Date: 01/26/2021 CLINICAL DATA:  Diabetic foot swelling and infection EXAM: MRI OF THE RIGHT FOREFOOT WITHOUT CONTRAST TECHNIQUE: Multiplanar, multisequence MR imaging of the right forefoot was performed. No intravenous contrast was administered. COMPARISON:  X-ray 01/25/2021 FINDINGS: Bones/Joint/Cartilage Bone marrow edema throughout the distal phalanx of the right second toe with associated confluent low T1 marrow signal compatible with acute osteomyelitis (series 1,015 and 1,023, image 3). Bone marrow signal within the proximal and middle phalanx of the second toe is maintained. Mild bone marrow edema within the great toe distal phalanx without  associated low T1 signal changes (series 1021, image 14). Remaining visualized osseous structures are intact. No fracture. No dislocation. Susceptibility artifact related to distal tibial and fibular hardware. No joint effusions. Ligaments Intact Lisfranc ligament. Collateral ligaments of the forefoot appear intact. Muscles and Tendons Denervation changes of the intrinsic foot musculature. Flexor and extensor tendons remain intact. Small amount of tenosynovial fluid located within the plantar foot proximally at the level of the knot of Henry. Soft tissues Marked dorsal subcutaneous edema throughout the foot and visualized ankle. No organized or drainable fluid collection. There is more focal soft tissue swelling of the second toe. IMPRESSION: 1. Acute osteomyelitis of the distal phalanx of the right second toe. 2. Mild bone marrow edema within the great toe distal phalanx favored to represent reactive osteitis. 3. Marked dorsal subcutaneous edema throughout the foot and visualized ankle. No organized or drainable fluid collection. 4. Small amount of tenosynovial fluid located within the plantar foot proximally at the level of the knot of Henry. Electronically  Signed   By: Nicholas  Plundo D.O.   On: 01/26/2021 15:34   DG Foot Complete Right  Result Date: 01/25/2021 CLINICAL DATA:  Foot pain, drainage.  Question osteomyelitis. EXAM: RIGHT FOOT COMPLETE - 3+ VIEW COMPARISON:  None. FINDINGS: Hardware noted in the distal tibia and fibula. Marked soft tissue swelling in the distal calf and ankle region. No acute bony abnormality. Specifically, no fracture, subluxation, or dislocation. Plantar calcaneal spur. No bone destruction to suggest osteomyelitis. IMPRESSION: No evidence of osteomyelitis within the foot. Soft tissue swelling in the lower calf/ankle. Electronically Signed   By: Kevin  Dover M.D.   On: 01/25/2021 17:33   US EKG SITE RITE  Result Date: 01/27/2021 If Site Rite image not attached, placement  could not be confirmed due to current cardiac rhythm.    Microbiology: Recent Results (from the past 240 hour(s))  Resp Panel by RT-PCR (Flu A&B, Covid) Nasopharyngeal Swab     Status: None   Collection Time: 01/25/21  8:33 PM   Specimen: Nasopharyngeal Swab; Nasopharyngeal(NP) swabs in vial transport medium  Result Value Ref Range Status   SARS Coronavirus 2 by RT PCR NEGATIVE NEGATIVE Final    Comment: (NOTE) SARS-CoV-2 target nucleic acids are NOT DETECTED.  The SARS-CoV-2 RNA is generally detectable in upper respiratory specimens during the acute phase of infection. The lowest concentration of SARS-CoV-2 viral copies this assay can detect is 138 copies/mL. A negative result does not preclude SARS-Cov-2 infection and should not be used as the sole basis for treatment or other patient management decisions. A negative result may occur with  improper specimen collection/handling, submission of specimen other than nasopharyngeal swab, presence of viral mutation(s) within the areas targeted by this assay, and inadequate number of viral copies(<138 copies/mL). A negative result must be combined with clinical observations, patient history, and epidemiological information. The expected result is Negative.  Fact Sheet for Patients:  https://www.fda.gov/media/152166/download  Fact Sheet for Healthcare Providers:  https://www.fda.gov/media/152162/download  This test is no t yet approved or cleared by the [place] FDA and  has been authorized for detection and/or diagnosis of SARS-CoV-2 by FDA under an Emergency Use Authorization (EUA). This EUA will remain  in effect (meaning this test can be used) for the duration of the COVID-19 declaration under Section 564(b)(1) of the Act, 21 U.S.C.section 360bbb-3(b)(1), unless the authorization is terminated  or revoked sooner.       Influenza A by PCR NEGATIVE NEGATIVE Final   Influenza B by PCR NEGATIVE NEGATIVE Final    Comment:  (NOTE) The Xpert Xpress SARS-CoV-2/FLU/RSV plus assay is intended as an aid in the diagnosis of influenza from Nasopharyngeal swab specimens and should not be used as a sole basis for treatment. Nasal washings and aspirates are unacceptable for Xpert Xpress SARS-CoV-2/FLU/RSV testing.  Fact Sheet for Patients: https://www.fda.gov/media/152166/download  Fact Sheet for Healthcare Providers: https://www.fda.gov/media/152162/download  This test is not yet approved or cleared by the [place] FDA and has been authorized for detection and/or diagnosis of SARS-CoV-2 by FDA under an Emergency Use Authorization (EUA). This EUA will remain in effect (meaning this test can be used) for the duration of the COVID-19 declaration under Section 564(b)(1) of the Act, 21 U.S.C. section 360bbb-3(b)(1), unless the authorization is terminated or revoked.  Performed at [facility], [address] [place], [place] [address]      Labs: Basic Metabolic Panel: Recent Labs  Lab 01/25/21 1740 01/26/21 0430 01/27/21 0427  NA 135 136 134*  K 4.5 3.6 3.8    CL 100 100 100  CO2 _0 GLUCOSE 160* 138* 190*  BUN _1 CREATININE 0.42* 0.37* 0.46  CALCIUM 9.0 8.8* 8.8*  MG  --  1.9 1.9  PHOS  --  3.8  --    Liver Function Tests: Recent Labs  Lab 01/25/21 1740 01/26/21 0430  AST 14* 12*  ALT 15 13  ALKPHOS 88 79  BILITOT 0.7 0.8  PROT 7.8 6.9  ALBUMIN 3.6 3.2*   No results for input(s): LIPASE, AMYLASE in the last 168 hours. No results for input(s): AMMONIA in the last 168 hours. CBC: Recent Labs  Lab 01/25/21 1740 01/26/21 0430 01/27/21 0427  WBC 10.5 9.3 8.4  NEUTROABS 8.1*  --   --   HGB 13.0 12.6 13.0  HCT 41.1 39.7 41.3  MCV 88.0 88.2 87.3  PLT 399 390 416*   Cardiac Enzymes: No results for input(s): CKTOTAL, CKMB, CKMBINDEX, TROPONINI in the last 168 hours. BNP: Invalid input(s): POCBNP CBG: Recent Labs  Lab 01/27/21 1257 01/27/21 1643 01/27/21 2115  01/28/21 0811 01/28/21 1133  GLUCAP 169* 172* 176* 159* 159*    Time coordinating discharge:  36 minutes  Signed:  Orson Eva, DO [facility] Pager: [phone] 01/28/2021, 12:29 PM

## 2021-01-28 NOTE — Progress Notes (Signed)
PHARMACY CONSULT NOTE FOR:  OUTPATIENT  PARENTERAL ANTIBIOTIC THERAPY (OPAT)  Indication: osteomyelitis Regimen: Vancomycin 1750mg  IV q12hrs and Rocephin 2gm IV q24hrs End date: 03/08/2021  IV antibiotic discharge orders are pended. To discharging provider:  please sign these orders via discharge navigator,  Select New Orders & click on the button choice - Manage This Unsigned Work.     Thank you for allowing pharmacy to be a part of this patient's care.  05/08/2021 A 01/28/2021, 11:57 AM

## 2021-01-28 NOTE — TOC Progression Note (Signed)
Transition of Care [facility]) - Progression Note    Patient Details  Name: Lidia Clavijo MRN: [id number] Date of Birth: [birth year]  [facility] CM/SW Contact  Maree Krabbe, LCSW Phone Number: 01/28/2021, 11:02 AM  Clinical Narrative:   CSW spoke with pt's spouse via telephone and provided bed offers. PT's spouse really wants pt to go to [facility] in [place]. CSW explained they have no responded in our system yet. CSW will reach out but doubtful they will respond on a Sunday. CSW will leave a note for CSW follow up with SNF tomorrow as well.      Barriers to Discharge: Continued Medical Work up  Expected Discharge Plan and Services                                                 Social Determinants of Health (SDOH) Interventions    Readmission Risk Interventions No flowsheet data found.

## 2021-01-29 DIAGNOSIS — L089 Local infection of the skin and subcutaneous tissue, unspecified: Secondary | ICD-10-CM | POA: Diagnosis not present

## 2021-01-29 DIAGNOSIS — E11628 Type 2 diabetes mellitus with other skin complications: Secondary | ICD-10-CM | POA: Diagnosis not present

## 2021-01-29 DIAGNOSIS — T148XXA Other injury of unspecified body region, initial encounter: Secondary | ICD-10-CM | POA: Diagnosis not present

## 2021-01-30 ENCOUNTER — Other Ambulatory Visit: Payer: Self-pay | Admitting: Podiatry

## 2021-01-30 DIAGNOSIS — M869 Osteomyelitis, unspecified: Secondary | ICD-10-CM | POA: Diagnosis not present

## 2021-01-30 DIAGNOSIS — L97412 Non-pressure chronic ulcer of right heel and midfoot with fat layer exposed: Secondary | ICD-10-CM | POA: Diagnosis not present

## 2021-01-30 DIAGNOSIS — L03115 Cellulitis of right lower limb: Secondary | ICD-10-CM | POA: Diagnosis not present

## 2021-01-30 DIAGNOSIS — L97514 Non-pressure chronic ulcer of other part of right foot with necrosis of bone: Secondary | ICD-10-CM | POA: Diagnosis not present

## 2021-01-31 ENCOUNTER — Encounter (HOSPITAL_COMMUNITY)
Admission: RE | Admit: 2021-01-31 | Discharge: 2021-01-31 | Disposition: A | Discharge status: Home / Self Care | Payer: BC Managed Care – PPO | Source: Ambulatory Visit | Attending: Podiatry | Admitting: Podiatry

## 2021-01-31 ENCOUNTER — Other Ambulatory Visit (HOSPITAL_COMMUNITY)
Admission: RE | Admit: 2021-01-31 | Discharge: 2021-01-31 | Disposition: A | Discharge status: Home / Self Care | Payer: BC Managed Care – PPO | Source: Ambulatory Visit | Attending: Podiatry | Admitting: Podiatry

## 2021-01-31 ENCOUNTER — Encounter (HOSPITAL_COMMUNITY): Payer: Self-pay

## 2021-01-31 ENCOUNTER — Other Ambulatory Visit: Payer: Self-pay | Admitting: Podiatry

## 2021-01-31 ENCOUNTER — Other Ambulatory Visit: Payer: Self-pay

## 2021-01-31 DIAGNOSIS — L97412 Non-pressure chronic ulcer of right heel and midfoot with fat layer exposed: Secondary | ICD-10-CM | POA: Diagnosis not present

## 2021-01-31 DIAGNOSIS — Z01818 Encounter for other preprocedural examination: Secondary | ICD-10-CM | POA: Insufficient documentation

## 2021-01-31 DIAGNOSIS — L97514 Non-pressure chronic ulcer of other part of right foot with necrosis of bone: Secondary | ICD-10-CM | POA: Diagnosis not present

## 2021-01-31 DIAGNOSIS — L03115 Cellulitis of right lower limb: Secondary | ICD-10-CM | POA: Diagnosis not present

## 2021-01-31 DIAGNOSIS — Z20822 Contact with and (suspected) exposure to covid-19: Secondary | ICD-10-CM | POA: Insufficient documentation

## 2021-01-31 DIAGNOSIS — I890 Lymphedema, not elsewhere classified: Secondary | ICD-10-CM | POA: Diagnosis not present

## 2021-01-31 DIAGNOSIS — L859 Epidermal thickening, unspecified: Secondary | ICD-10-CM | POA: Diagnosis not present

## 2021-01-31 DIAGNOSIS — E11621 Type 2 diabetes mellitus with foot ulcer: Secondary | ICD-10-CM | POA: Diagnosis not present

## 2021-01-31 DIAGNOSIS — Z6841 Body Mass Index (BMI) 40.0 and over, adult: Secondary | ICD-10-CM | POA: Diagnosis not present

## 2021-01-31 DIAGNOSIS — M86671 Other chronic osteomyelitis, right ankle and foot: Secondary | ICD-10-CM | POA: Diagnosis not present

## 2021-01-31 DIAGNOSIS — L603 Nail dystrophy: Secondary | ICD-10-CM | POA: Diagnosis not present

## 2021-01-31 DIAGNOSIS — E1169 Type 2 diabetes mellitus with other specified complication: Secondary | ICD-10-CM | POA: Diagnosis not present

## 2021-01-31 DIAGNOSIS — E1142 Type 2 diabetes mellitus with diabetic polyneuropathy: Secondary | ICD-10-CM | POA: Diagnosis not present

## 2021-01-31 LAB — SARS CORONAVIRUS 2 (TAT 6-24 HRS): SARS Coronavirus 2: NEGATIVE

## 2021-01-31 NOTE — Pre-Procedure Instructions (Signed)
No Preop orders in epic and patient states she did not receive any folders of information from the office to bring today. I called Rhonda at [facility] and she could not enter the orders for some Epic glitch yesterday. She and Dr Nolen Mu are going to look at this again today and get both the consent and orders done. She will either fax me consent or Dr Nolen Mu will bring it tomorrow. I gave her fax [phone] and my direct line to contact me if she needs anything or has questions.

## 2021-01-31 NOTE — Patient Instructions (Addendum)
[name]  01/31/2021     @PREFPERIOPPHARMACY @   Your procedure is scheduled on  02/01/2021.   Report to 02/03/2021 at  0800  A.M.   Call this number if you have problems the morning of surgery:  [phone]   Remember:  Do not eat or drink after midnight.                        Take these medicines the morning of surgery with A SIP OF WATER    Abilify, lexapro, Oxy IR (if needed), protonix.  DO NOT take any medications for diabetes the morning of your procedure.   If your glucose is 70 or below the morning of your procedure, drink 1/2 cup of clear liquid that contains sugar and recheck your glucose in 15 minutes. If your glucose is still 70 or below, call [phone] for instructions.  If your glucose is 300 or above the morning of your procedure, call [phone] for instructions.       Do not wear jewelry, make-up or nail polish.  Do not wear lotions, powders, or perfumes, or deodorant.  Do not shave 48 hours prior to surgery.  Men may shave face and neck.  Do not bring valuables to the hospital.  [facility] is not responsible for any belongings or valuables.  Contacts, dentures or bridgework may not be worn into surgery.  Leave your suitcase in the car.  After surgery it may be brought to your room.  For patients admitted to the hospital, discharge time will be determined by your treatment team.  Patients discharged the day of surgery will not be allowed to drive home and must have someone with them for 24 hours.   Place clean sheets on your bed the night before your procedure and DO NOT sleep with pets this night.  Shower with CHG the night before and the morning of your procedure. DO NOT put CHG on your face, hair or genitals.  After each shower, dry off with a clean towel, put on clean, comfortable clothes and brush your teeth.      Special instructions:   DO NOT smoke tobacco or vape for 24 hours before your procedure.   Please read  over the following fact sheets that you were given. Coughing and Deep Breathing, Surgical Site Infection Prevention, Anesthesia Post-op Instructions and Care and Recovery After Surgery       Surgical Wound Debridement, Care After This sheet gives you information about how to care for yourself after your procedure. Your health care provider may also give you more specific instructions. If you have problems or questions, contact your health care provider. What can I expect after the procedure? After the procedure, it is common to have:  Pain or soreness.  Fluid that leaks from the wound.  Stiffness.  A larger wound. This is because the dead tissue has been removed. Follow these instructions at home: Medicines  Take over-the-counter and prescription medicines only as told by your health care provider.  If you were prescribed an antibiotic medicine, take it or apply it as told by your health care provider. Do not stop using the antibiotic even if you start to feel better. Wound care  Follow instructions from your health care provider about how to take care of your wound. Make sure you: ? Wash your hands with soap and water before and after you change your bandage (dressing). If  soap and water are not available, use hand sanitizer. ? Change your dressing as told by your health care provider. ? If your dressing is dry and stuck when you try to remove it, moisten or wet the dressing with saline or water so that it can be removed without harming your skin or wound tissue.  Check your wound for signs of infection every time your dressing is changed. Have someone help you do this if you are not able. Watch for: ? More redness, swelling, or pain. ? More fluid or blood. ? Warmth. ? Pus. ? A bad smell coming from your wound even after you clean it.  Do not take baths, swim, or use a hot tub until your health care provider approves. Ask your health care provider if you may take showers. You  may only be allowed to take sponge baths. Activity  Do not drive or use heavy machinery while taking prescription pain medicine.  Return to your normal activities as told by your health care provider. Ask your health care provider what activities are safe for you. General instructions  Eat a healthy diet with lots of protein such as meats, cheese, nuts and beans. Ask your health care provider to suggest the best diet for you.  Do not use any products that contain nicotine or tobacco, such as cigarettes, e-cigarettes, and chewing tobacco. These can delay wound healing after surgery. If you need help quitting, ask your health care provider.  Keep all follow-up visits as told by your health care provider. This is important.      Contact a health care provider if:  You have a fever.  Your pain medicine is not helping.  Your wound is more red and swollen.  You have increased bleeding.  You have pus coming from your wound.  You have a bad smell coming from your wound.  Your wound is not getting better after 1-2 weeks of treatment.  You develop a new medical condition, such as diabetes, peripheral vascular disease, or conditions that affect your defense system (immune system). Summary  After the procedure, it is common to have pain, soreness, stiffness, or fluid leaking from the wound.  Follow instructions from your health care provider about how to take care of your wound.  Check your wound for signs of infection every time your dressing is changed.  Eat a healthy diet with lots of protein. Ask your health care provider to suggest the best diet for you.  Contact a health care provider if you have fever, more swelling and redness, increased bleeding, or a bad smell from the wound. This information is not intended to replace advice given to you by your health care provider. Make sure you discuss any questions you have with your health care provider. Document Revised: 09/15/2018  Document Reviewed: 09/15/2018 Elsevier Patient Education  2021 [employer]vier Inc[employer]  Toe Amputation, Care After This sheet gives you information about how to care for yourself after your procedure. Your health care provider may also give you more specific instructions. If you have problems or questions, contact your health care provider. What can I expect after the procedure? After the procedure, it is common to have some pain. Pain usually improves within a week. Follow these instructions at home: Medicines  Take over-the-counter and prescription medicines only as told by your health care provider.  If you were prescribed an antibiotic medicine, take it as told by your health care provider. Do not stop taking the antibiotic even if you  start to feel better.  Ask your health care provider if the medicine prescribed to you: ? Requires you to avoid driving or using heavy machinery. ? Can cause constipation. You may need to take actions to prevent or treat constipation, such as:  Drink enough fluid to keep your urine pale yellow.  Take over-the-counter or prescription medicines.  Eat foods that are high in fiber, such as beans, whole grains, and fresh fruits and vegetables.  Limit foods that are high in fat and processed sugars, such as fried or sweet foods. Managing pain, stiffness, and swelling  If directed, put ice on the painful area. ? Put ice in a plastic bag. ? Place a towel between your skin and the bag. ? Leave the ice on for 20 minutes, 2-3 times a day.  Move your other toes often to reduce stiffness and swelling.  Raise (elevate) your foot above the level of your heart while you are sitting or lying down.   Incision care  Follow instructions from your health care provider about how to take care of your incision. Make sure you: ? Wash your hands with soap and water before and after you change your bandage (dressing). If soap and water are not available, use hand  sanitizer. ? Change your dressing as told by your health care provider. ? Leave stitches (sutures), skin glue, or adhesive strips in place. These skin closures may need to stay in place for 2 weeks or longer. If adhesive strip edges start to loosen and curl up, you may trim the loose edges. Do not remove adhesive strips completely unless your health care provider tells you to do that.  Check your incision area every day for signs of infection. Check for: ? More redness, swelling, or pain. ? Fluid or blood. ? Warmth. ? Pus or a bad smell.      Bathing  Do not take baths, swim, use a hot tub, or soak your foot until your health care provider approves. Ask your health care provider if you may take showers. You may only be allowed to take sponge baths.  If your dressing has been removed, you may wash your skin with warm water and soap. Activity  Rest as told by your health care provider.  Avoid sitting for a long time without moving. Get up to take short walks every 1-2 hours. This is important to improve blood flow and breathing. Ask for help if you feel weak or unsteady.  If you have been given crutches, use them as told by your health care provider.  Do exercises as told by your health care provider.  Return to your normal activities as told by your health care provider. Ask your health care provider what activities are safe for you. General instructions  Do not drive for 24 hours if you were given a sedative during your procedure.  Do not use any products that contain nicotine or tobacco, such as cigarettes, e-cigarettes, and chewing tobacco. If you need help quitting, ask your health care provider.  Ask your health care provider about wearing supportive shoes or using inserts to help with your balance when walking.  If you have diabetes, keep your blood sugar under good control and check your feet daily for sores or open areas.  Keep all follow-up visits as told by your health  care provider. This is important. Contact a health care provider if:  You have signs of infection at your incision, such as: ? Redness, swelling, or pain. ?  Fluid or blood. ? Warmth. ? Pus or a bad smell.  You have a fever or chills.  Your dressing is soaked with blood.  Your sutures tear or they separate.  You have numbness or tingling in your toes or foot.  Your foot is cool or pale, or it changes color.  Your pain does not improve after you take your medicine. Get help right away if you have:  Pain or swelling near your incision that gets worse or does not go away.  Red streaks on your skin near your toes, foot, or leg.  Pain in your calf or behind your knee.  Shortness of breath.  Chest pain. Summary  After the procedure, it is common to have some pain. Pain usually improves within a week.  If you were prescribed an antibiotic medicine, take it as told by your health care provider. Do not stop taking the antibiotic even if you start to feel better.  Change your dressing as told by your health care provider.  Contact a health care provider if you have signs of infection.  Keep all follow-up visits as told by your health care provider. This is important. This information is not intended to replace advice given to you by your health care provider. Make sure you discuss any questions you have with your health care provider. Document Revised: 01/13/2019 Document Reviewed: 09/15/2018 Elsevier Patient Education  2021 Elsevier Inc. General Anesthesia, Adult, Care After This sheet gives you information about how to care for yourself after your procedure. Your health care provider may also give you more specific instructions. If you have problems or questions, contact your health care provider. What can I expect after the procedure? After the procedure, the following side effects are common:  Pain or discomfort at the IV site.  Nausea.  Vomiting.  Sore  throat.  Trouble concentrating.  Feeling cold or chills.  Feeling weak or tired.  Sleepiness and fatigue.  Soreness and body aches. These side effects can affect parts of the body that were not involved in surgery. Follow these instructions at home: For the time period you were told by your health care provider:  Rest.  Do not participate in activities where you could fall or become injured.  Do not drive or use machinery.  Do not drink alcohol.  Do not take sleeping pills or medicines that cause drowsiness.  Do not make important decisions or sign legal documents.  Do not take care of children on your own.   Eating and drinking  Follow any instructions from your health care provider about eating or drinking restrictions.  When you feel hungry, start by eating small amounts of foods that are soft and easy to digest (bland), such as toast. Gradually return to your regular diet.  Drink enough fluid to keep your urine pale yellow.  If you vomit, rehydrate by drinking water, juice, or clear broth. General instructions  If you have sleep apnea, surgery and certain medicines can increase your risk for breathing problems. Follow instructions from your health care provider about wearing your sleep device: ? Anytime you are sleeping, including during daytime naps. ? While taking prescription pain medicines, sleeping medicines, or medicines that make you drowsy.  Have a responsible adult stay with you for the time you are told. It is important to have someone help care for you until you are awake and alert.  Return to your normal activities as told by your health care provider. Ask your health care provider  what activities are safe for you.  Take over-the-counter and prescription medicines only as told by your health care provider.  If you smoke, do not smoke without supervision.  Keep all follow-up visits as told by your health care provider. This is important. Contact a  health care provider if:  You have nausea or vomiting that does not get better with medicine.  You cannot eat or drink without vomiting.  You have pain that does not get better with medicine.  You are unable to pass urine.  You develop a skin rash.  You have a fever.  You have redness around your IV site that gets worse. Get help right away if:  You have difficulty breathing.  You have chest pain.  You have blood in your urine or stool, or you vomit blood. Summary  After the procedure, it is common to have a sore throat or nausea. It is also common to feel tired.  Have a responsible adult stay with you for the time you are told. It is important to have someone help care for you until you are awake and alert.  When you feel hungry, start by eating small amounts of foods that are soft and easy to digest (bland), such as toast. Gradually return to your regular diet.  Drink enough fluid to keep your urine pale yellow.  Return to your normal activities as told by your health care provider. Ask your health care provider what activities are safe for you. This information is not intended to replace advice given to you by your health care provider. Make sure you discuss any questions you have with your health care provider. Document Revised: 06/08/2020 Document Reviewed: 01/06/2020 Elsevier Patient Education  2021 [employer]  I

## 2021-02-01 ENCOUNTER — Encounter (HOSPITAL_COMMUNITY)
Admission: RE | Disposition: A | Discharge status: Home / Self Care | Payer: Self-pay | Source: Home / Self Care | Attending: Podiatry

## 2021-02-01 ENCOUNTER — Ambulatory Visit (HOSPITAL_COMMUNITY): Payer: BC Managed Care – PPO | Admitting: Anesthesiology

## 2021-02-01 ENCOUNTER — Encounter (HOSPITAL_COMMUNITY): Payer: Self-pay | Admitting: Podiatry

## 2021-02-01 ENCOUNTER — Ambulatory Visit (HOSPITAL_COMMUNITY): Payer: BC Managed Care – PPO

## 2021-02-01 ENCOUNTER — Ambulatory Visit (HOSPITAL_COMMUNITY)
Admission: RE | Admit: 2021-02-01 | Discharge: 2021-02-01 | Disposition: A | Discharge status: Home / Self Care | Payer: BC Managed Care – PPO | Attending: Podiatry | Admitting: Podiatry

## 2021-02-01 DIAGNOSIS — M86671 Other chronic osteomyelitis, right ankle and foot: Secondary | ICD-10-CM | POA: Diagnosis not present

## 2021-02-01 DIAGNOSIS — I890 Lymphedema, not elsewhere classified: Secondary | ICD-10-CM | POA: Diagnosis not present

## 2021-02-01 DIAGNOSIS — L97514 Non-pressure chronic ulcer of other part of right foot with necrosis of bone: Secondary | ICD-10-CM | POA: Diagnosis not present

## 2021-02-01 DIAGNOSIS — E11621 Type 2 diabetes mellitus with foot ulcer: Secondary | ICD-10-CM | POA: Insufficient documentation

## 2021-02-01 DIAGNOSIS — L603 Nail dystrophy: Secondary | ICD-10-CM | POA: Insufficient documentation

## 2021-02-01 DIAGNOSIS — E1169 Type 2 diabetes mellitus with other specified complication: Secondary | ICD-10-CM | POA: Insufficient documentation

## 2021-02-01 DIAGNOSIS — Z6841 Body Mass Index (BMI) 40.0 and over, adult: Secondary | ICD-10-CM | POA: Diagnosis not present

## 2021-02-01 DIAGNOSIS — Z20822 Contact with and (suspected) exposure to covid-19: Secondary | ICD-10-CM | POA: Diagnosis not present

## 2021-02-01 DIAGNOSIS — M869 Osteomyelitis, unspecified: Secondary | ICD-10-CM | POA: Diagnosis not present

## 2021-02-01 DIAGNOSIS — L97412 Non-pressure chronic ulcer of right heel and midfoot with fat layer exposed: Secondary | ICD-10-CM | POA: Insufficient documentation

## 2021-02-01 DIAGNOSIS — Z9889 Other specified postprocedural states: Secondary | ICD-10-CM | POA: Diagnosis not present

## 2021-02-01 DIAGNOSIS — L03115 Cellulitis of right lower limb: Secondary | ICD-10-CM | POA: Diagnosis not present

## 2021-02-01 DIAGNOSIS — Z89421 Acquired absence of other right toe(s): Secondary | ICD-10-CM | POA: Diagnosis not present

## 2021-02-01 DIAGNOSIS — E1142 Type 2 diabetes mellitus with diabetic polyneuropathy: Secondary | ICD-10-CM | POA: Insufficient documentation

## 2021-02-01 DIAGNOSIS — L84 Corns and callosities: Secondary | ICD-10-CM | POA: Diagnosis not present

## 2021-02-01 DIAGNOSIS — L859 Epidermal thickening, unspecified: Secondary | ICD-10-CM | POA: Insufficient documentation

## 2021-02-01 DIAGNOSIS — S98131A Complete traumatic amputation of one right lesser toe, initial encounter: Secondary | ICD-10-CM | POA: Diagnosis not present

## 2021-02-01 DIAGNOSIS — M7731 Calcaneal spur, right foot: Secondary | ICD-10-CM | POA: Diagnosis not present

## 2021-02-01 HISTORY — PX: IRRIGATION AND DEBRIDEMENT FOOT: SHX6602

## 2021-02-01 HISTORY — PX: AMPUTATION: SHX166

## 2021-02-01 HISTORY — PX: WOUND DEBRIDEMENT: SHX247

## 2021-02-01 LAB — GLUCOSE, CAPILLARY
Glucose-Capillary: 129 mg/dL — ABNORMAL HIGH (ref 70–99)
Glucose-Capillary: 160 mg/dL — ABNORMAL HIGH (ref 70–99)

## 2021-02-01 SURGERY — AMPUTATION, FOOT, RAY
Anesthesia: General | Site: Toe | Laterality: Right

## 2021-02-01 MED ORDER — LIDOCAINE HCL (PF) 1 % IJ SOLN
INTRAMUSCULAR | Status: AC
  Filled 2021-02-01: qty 30

## 2021-02-01 MED ORDER — ONDANSETRON HCL 4 MG/2ML IJ SOLN
INTRAMUSCULAR | Status: DC | PRN
  Administered 2021-02-01: 4 mg via INTRAVENOUS

## 2021-02-01 MED ORDER — BUPIVACAINE HCL (PF) 0.5 % IJ SOLN
INTRAMUSCULAR | Status: DC | PRN
  Administered 2021-02-01: 20 mL

## 2021-02-01 MED ORDER — FENTANYL CITRATE (PF) 100 MCG/2ML IJ SOLN
INTRAMUSCULAR | Status: DC | PRN
  Administered 2021-02-01 (×2): 100 ug via INTRAVENOUS

## 2021-02-01 MED ORDER — HEPARIN SOD (PORK) LOCK FLUSH 100 UNIT/ML IV SOLN
250.0000 [IU] | INTRAVENOUS | Status: AC | PRN
  Administered 2021-02-01: 250 [IU]

## 2021-02-01 MED ORDER — SODIUM CHLORIDE 0.9% FLUSH
10.0000 mL | INTRAVENOUS | Status: AC | PRN
  Administered 2021-02-01: 10 mL

## 2021-02-01 MED ORDER — LACTATED RINGERS IV SOLN
INTRAVENOUS | Status: DC
  Administered 2021-02-01: 1000 mL via INTRAVENOUS

## 2021-02-01 MED ORDER — LIDOCAINE HCL (PF) 2 % IJ SOLN
INTRAMUSCULAR | Status: AC
  Filled 2021-02-01: qty 5

## 2021-02-01 MED ORDER — HEPARIN SOD (PORK) LOCK FLUSH 100 UNIT/ML IV SOLN
INTRAVENOUS | Status: AC
  Filled 2021-02-01: qty 5

## 2021-02-01 MED ORDER — BUPIVACAINE HCL (PF) 0.5 % IJ SOLN
INTRAMUSCULAR | Status: AC
  Filled 2021-02-01: qty 30

## 2021-02-01 MED ORDER — PROPOFOL 10 MG/ML IV BOLUS
INTRAVENOUS | Status: DC | PRN
  Administered 2021-02-01: 200 mg via INTRAVENOUS

## 2021-02-01 MED ORDER — GLYCOPYRROLATE 0.2 MG/ML IJ SOLN
INTRAMUSCULAR | Status: DC | PRN
  Administered 2021-02-01: .1 mg via INTRAVENOUS

## 2021-02-01 MED ORDER — MIDAZOLAM HCL 5 MG/5ML IJ SOLN
INTRAMUSCULAR | Status: DC | PRN
  Administered 2021-02-01: 2 mg via INTRAVENOUS

## 2021-02-01 MED ORDER — 0.9 % SODIUM CHLORIDE (POUR BTL) OPTIME
TOPICAL | Status: DC | PRN
  Administered 2021-02-01: 1000 mL

## 2021-02-01 MED ORDER — VANCOMYCIN HCL 2000 MG/400ML IV SOLN
2000.0000 mg | Freq: Once | INTRAVENOUS | Status: AC
  Administered 2021-02-01: 2000 mg via INTRAVENOUS

## 2021-02-01 MED ORDER — ORAL CARE MOUTH RINSE: 15.0000 mL | Freq: Once | OROMUCOSAL | Status: AC

## 2021-02-01 MED ORDER — MEPERIDINE HCL 50 MG/ML IJ SOLN: 6.2500 mg | INTRAMUSCULAR | Status: DC | PRN

## 2021-02-01 MED ORDER — ONDANSETRON HCL 4 MG/2ML IJ SOLN: 4.0000 mg | Freq: Once | INTRAMUSCULAR | Status: DC | PRN

## 2021-02-01 MED ORDER — VANCOMYCIN HCL IN DEXTROSE 1-5 GM/200ML-% IV SOLN
INTRAVENOUS | Status: AC
  Filled 2021-02-01: qty 400

## 2021-02-01 MED ORDER — PHENYLEPHRINE HCL (PRESSORS) 10 MG/ML IV SOLN
INTRAVENOUS | Status: DC | PRN
  Administered 2021-02-01: 80 ug via INTRAVENOUS
  Administered 2021-02-01 (×2): 100 ug via INTRAVENOUS
  Administered 2021-02-01: 80 ug via INTRAVENOUS
  Administered 2021-02-01: 40 ug via INTRAVENOUS
  Administered 2021-02-01: 80 ug via INTRAVENOUS
  Administered 2021-02-01: 120 ug via INTRAVENOUS

## 2021-02-01 MED ORDER — FENTANYL CITRATE (PF) 100 MCG/2ML IJ SOLN
INTRAMUSCULAR | Status: AC
  Filled 2021-02-01: qty 2

## 2021-02-01 MED ORDER — SODIUM CHLORIDE FLUSH 0.9 % IV SOLN
INTRAVENOUS | Status: AC
  Filled 2021-02-01: qty 10

## 2021-02-01 MED ORDER — SUCCINYLCHOLINE CHLORIDE 200 MG/10ML IV SOSY
PREFILLED_SYRINGE | INTRAVENOUS | Status: AC
  Filled 2021-02-01: qty 10

## 2021-02-01 MED ORDER — LIDOCAINE HCL (CARDIAC) PF 100 MG/5ML IV SOSY
PREFILLED_SYRINGE | INTRAVENOUS | Status: DC | PRN
  Administered 2021-02-01: 40 mg via INTRAVENOUS

## 2021-02-01 MED ORDER — CHLORHEXIDINE GLUCONATE 0.12 % MT SOLN
15.0000 mL | Freq: Once | OROMUCOSAL | Status: AC
  Administered 2021-02-01: 15 mL via OROMUCOSAL

## 2021-02-01 MED ORDER — SUCCINYLCHOLINE CHLORIDE 200 MG/10ML IV SOSY
PREFILLED_SYRINGE | INTRAVENOUS | Status: DC | PRN
  Administered 2021-02-01: 140 mg via INTRAVENOUS

## 2021-02-01 MED ORDER — FENTANYL CITRATE (PF) 100 MCG/2ML IJ SOLN: 25.0000 ug | INTRAMUSCULAR | Status: DC | PRN

## 2021-02-01 MED ORDER — MIDAZOLAM HCL 2 MG/2ML IJ SOLN
INTRAMUSCULAR | Status: AC
  Filled 2021-02-01: qty 2

## 2021-02-01 MED ORDER — PHENYLEPHRINE HCL (PRESSORS) 10 MG/ML IV SOLN
INTRAVENOUS | Status: AC
  Filled 2021-02-01: qty 1

## 2021-02-01 SURGICAL SUPPLY — 32 items
BLADE SURG 15 STRL LF DISP TIS (BLADE) ×9 IMPLANT
BLADE SURG 15 STRL SS (BLADE) ×12
BNDG CMPR STD VLCR NS LF 5.8X4 (GAUZE/BANDAGES/DRESSINGS) ×1
BNDG CONFORM 2 STRL LF (GAUZE/BANDAGES/DRESSINGS) ×4 IMPLANT
BNDG ELASTIC 4X5.8 VLCR NS LF (GAUZE/BANDAGES/DRESSINGS) ×4 IMPLANT
BNDG GAUZE ELAST 4 BULKY (GAUZE/BANDAGES/DRESSINGS) ×8 IMPLANT
CLOTH BEACON ORANGE TIMEOUT ST (SAFETY) ×4 IMPLANT
CNTNR URN SCR LID CUP LEK RST (MISCELLANEOUS) ×6 IMPLANT
CONT SPEC 4OZ STRL OR WHT (MISCELLANEOUS) ×8
COVER LIGHT HANDLE STERIS (MISCELLANEOUS) ×8 IMPLANT
COVER WAND RF STERILE (DRAPES) ×4 IMPLANT
DECANTER SPIKE VIAL GLASS SM (MISCELLANEOUS) ×4 IMPLANT
DRSG ADAPTIC 3X8 NADH LF (GAUZE/BANDAGES/DRESSINGS) ×4 IMPLANT
ELECT REM PT RETURN 9FT ADLT (ELECTROSURGICAL) ×4
ELECTRODE REM PT RTRN 9FT ADLT (ELECTROSURGICAL) ×3 IMPLANT
GAUZE SPONGE 4X4 12PLY STRL (GAUZE/BANDAGES/DRESSINGS) ×8 IMPLANT
GLOVE SURG ENC MOIS LTX SZ7.5 (GLOVE) ×4 IMPLANT
GLOVE SURG UNDER POLY LF SZ7 (GLOVE) ×8 IMPLANT
GOWN STRL REUS W/TWL LRG LVL3 (GOWN DISPOSABLE) ×12 IMPLANT
KIT TURNOVER KIT A (KITS) ×4 IMPLANT
MARKER SKIN DUAL TIP RULER LAB (MISCELLANEOUS) ×4 IMPLANT
NEEDLE HYPO 27GX1-1/4 (NEEDLE) ×8 IMPLANT
NS IRRIG 1000ML POUR BTL (IV SOLUTION) ×4 IMPLANT
PACK BASIC LIMB (CUSTOM PROCEDURE TRAY) ×4 IMPLANT
PAD ABD 5X9 TENDERSORB (GAUZE/BANDAGES/DRESSINGS) ×4 IMPLANT
PAD ARMBOARD 7.5X6 YLW CONV (MISCELLANEOUS) ×4 IMPLANT
SET BASIN LINEN APH (SET/KITS/TRAYS/PACK) ×4 IMPLANT
SOL PREP PROV IODINE SCRUB 4OZ (MISCELLANEOUS) ×4 IMPLANT
SPONGE LAP 18X18 RF (DISPOSABLE) ×4 IMPLANT
SUT PROLENE 3 0 PS 2 (SUTURE) ×8 IMPLANT
SUT PROLENE 4 0 PS 2 18 (SUTURE) IMPLANT
SYR CONTROL 10ML LL (SYRINGE) ×8 IMPLANT

## 2021-02-01 NOTE — Anesthesia Procedure Notes (Signed)
Procedure Name: Intubation Date/Time: 02/01/2021 9:51 AM Performed by: Jonna Munro, CRNA Pre-anesthesia Checklist: Patient identified, Emergency Drugs available, Suction available, Patient being monitored and Timeout performed Patient Re-evaluated:Patient Re-evaluated prior to induction Oxygen Delivery Method: Circle system utilized Preoxygenation: Pre-oxygenation with 100% oxygen Induction Type: IV induction, Rapid sequence and Cricoid Pressure applied Laryngoscope Size: Mac and 3 Grade View: Grade II Tube type: Oral Tube size: 7.0 mm Number of attempts: 1 Airway Equipment and Method: Stylet Placement Confirmation: ETT inserted through vocal cords under direct vision,  positive ETCO2 and breath sounds checked- equal and bilateral Secured at: 23 cm Tube secured with: Tape Dental Injury: Teeth and Oropharynx as per pre-operative assessment

## 2021-02-01 NOTE — Transfer of Care (Signed)
Immediate Anesthesia Transfer of Care Note  Patient: [name]  Procedure(s) Performed: PARTIAL OR COMPLETE AMPUTATION RIGHT 2ND TOE (Right Toe) DEBRIDEMENT OF ULCERATION RIGHT HEEL (Right Foot) DEBRIDEMENT OF NAILS OF BOTH FEET AND DEBRIDEMENT OF A CALLUS OF THE LEFT FOOT (Bilateral Foot)  Patient Location: PACU  Anesthesia Type:General  Level of Consciousness: awake, alert , oriented and patient cooperative  Airway & Oxygen Therapy: Patient Spontanous Breathing and Patient connected to face mask oxygen  Post-op Assessment: Report given to RN and Post -op Vital signs reviewed and stable  Post vital signs: Reviewed and stable  Last Vitals:  Vitals Value Taken Time  BP 118/67 02/01/21 1103  Temp    Pulse 92 02/01/21 1104  Resp    SpO2 90 % 02/01/21 1104  Vitals shown include unvalidated device data.  Last Pain: There were no vitals filed for this visit.       Complications: No complications documented.

## 2021-02-01 NOTE — Anesthesia Preprocedure Evaluation (Addendum)
Anesthesia Evaluation  Patient identified by MRN, date of birth, ID band Patient awake    Reviewed: Allergy & Precautions, NPO status , Patient's Chart, lab work & pertinent test results  History of Anesthesia Complications Negative for: history of anesthetic complications  Airway Mallampati: II  TM Distance: >3 FB Neck ROM: Full    Dental  (+) Dental Advisory Given, Chipped   Pulmonary neg pulmonary ROS,    Pulmonary exam normal breath sounds clear to auscultation       Cardiovascular Exercise Tolerance: Poor hypertension, Pt. on medications Normal cardiovascular exam Rhythm:Regular Rate:Normal     Neuro/Psych negative neurological ROS  negative psych ROS   GI/Hepatic Neg liver ROS, GERD  Medicated,  Endo/Other  diabetes, Well Controlled, Type obesity  Renal/GU      Musculoskeletal negative musculoskeletal ROS (+)   Abdominal   Peds  Hematology   Anesthesia Other Findings   Reproductive/Obstetrics negative OB ROS                             Anesthesia Physical Anesthesia Plan  ASA: IV  Anesthesia Plan: General   Post-op Pain Management:    Induction: Intravenous  PONV Risk Score and Plan: 3 and Ondansetron  Airway Management Planned: Oral ETT  Additional Equipment:   Intra-op Plan:   Post-operative Plan:   Informed Consent: I have reviewed the patients History and Physical, chart, labs and discussed the procedure including the risks, benefits and alternatives for the proposed anesthesia with the patient or authorized representative who has indicated his/her understanding and acceptance.     Dental advisory given  Plan Discussed with: CRNA and Surgeon  Anesthesia Plan Comments:        Anesthesia Quick Evaluation

## 2021-02-01 NOTE — Anesthesia Postprocedure Evaluation (Signed)
Anesthesia Post Note  Patient: [name]  Procedure(s) Performed: PARTIAL OR COMPLETE AMPUTATION RIGHT 2ND TOE (Right Toe) DEBRIDEMENT OF ULCERATION RIGHT HEEL (Right Foot) DEBRIDEMENT OF NAILS OF BOTH FEET AND DEBRIDEMENT OF A CALLUS OF THE LEFT FOOT (Bilateral Foot)  Patient location during evaluation: PACU Anesthesia Type: General Level of consciousness: awake and alert and oriented Pain management: pain level controlled Vital Signs Assessment: post-procedure vital signs reviewed and stable Respiratory status: spontaneous breathing and respiratory function stable Cardiovascular status: blood pressure returned to baseline and stable Postop Assessment: no apparent nausea or vomiting Anesthetic complications: no   No complications documented.   Last Vitals:  Vitals:   02/01/21 1103  Temp: 36.9 C  SpO2: 94%    Last Pain:  Vitals:   02/01/21 1103  PainSc: 0-No pain                 Arick Mareno C Edell Mesenbrink

## 2021-02-01 NOTE — H&P (Signed)
HISTORY AND PHYSICAL INTERVAL NOTE:  02/01/2021  8:55 AM  [name]  has presented today for surgery, with the following diagnoses:  1.  Cellulitis of the right foot secondary to ulceration of the right heel 2.  Osteomyelitis of the 2nd toe of the right foot secondary to ulceration of the 2nd toe of the right foot 3.  Onychodystrophy of both feet 4.  Hyperkeratosis of the left foot 5.  Type 2 diabetes mellitus with peripheral neuropathy  The various methods of treatment have been discussed with the patient.  No guarantees were given.  After consideration of risks, benefits and other options for treatment, the patient has consented to surgery.  I have reviewed the patients' chart and labs.    A history and physical examination was performed in my office.  The patient was reexamined.  She is no longer taking hydrocodone.  She is taking oxycodone 5 mg PRN but "it is not helping much".  There have been no changes to physical examination.  Dallas Schimke, DPM

## 2021-02-01 NOTE — Brief Op Note (Signed)
BRIEF OPERATIVE NOTE  DATE OF PROCEDURE 02/01/2021  SURGEON Dallas Schimke, DPM  ASSISTANT SURGEON None  OR STAFF Circulator: Lennox Pippins, RN Scrub Person: Hattie Perch, RN; Jonell Cluck, CST   PREOPERATIVE DIAGNOSIS 1.  Cellulitis of the right foot (ICD-10 L03.115). 2.  Ulceration of right second toe (ICD-10 L97.514). 3.  Osteomyelitis of the right second toe (ICD-10 M86.9). 4.  Ulceration of right heel (ICD-10 L97.412). 5.  Onychodystrophy of both feet (ICD-10 L60.3). 6.  Hyperkeratosis of the left foot (ICD-10 L84). 7.  Lymphedema (ICD-10 I89.0). 8.  Type 2 diabetes mellitus with peripheral neuropathy (ICD-10 E11.42).  POSTOPERATIVE DIAGNOSIS Same  PROCEDURE 1.  Amputation of the distal aspect of the right second toe at the distal interphalangeal joint (CPT [phone]) . 2.  Debridement of ulceration of right heel (CPT 11042) . 3.  Debridement of dystrophic nails (CPT 11720) . 4.  Trimming of nondystrophic nails (CPT 11719) . 5.  Paring of hyperkeratosis of the left foot (CPT 11055) .  ANESTHESIA General   HEMOSTASIS Pneumatic ankle tourniquet set at 250 mmHg  ESTIMATED BLOOD LOSS Minimal (<10 cc)  MATERIALS USED None  INJECTABLES 0.5% Marcaine plain  PATHOLOGY 1.  Bone from distal phalanx of right second toe for culture. 2.  Remaining portions of distal aspect of right second toe for pathology. 3.  Soft tissue from right heel ulceration for culture.  COMPLICATIONS None

## 2021-02-01 NOTE — Op Note (Signed)
OPERATIVE NOTE  DATE OF PROCEDURE 02/01/2021  SURGEON Dallas Schimke, DPM  ASSISTANT SURGEON None  OR STAFF Circulator: Lennox Pippins, RN Scrub Person: Hattie Perch, RN; Jonell Cluck, CST   PREOPERATIVE DIAGNOSIS 1.  Cellulitis of the right foot (ICD-10 L03.115). 2.  Ulceration of right second toe (ICD-10 L97.514). 3.  Osteomyelitis of the right second toe (ICD-10 M86.9). 4.  Ulceration of right heel (ICD-10 L97.412). 5.  Onychodystrophy of both feet (ICD-10 L60.3). 6.  Hyperkeratosis of the left foot (ICD-10 L84). 7.  Lymphedema (ICD-10 I89.0). 8.  Type 2 diabetes mellitus with peripheral neuropathy (ICD-10 E11.42).  POSTOPERATIVE DIAGNOSIS Same  PROCEDURE 1.  Amputation of the distal aspect of the right second toe at the distal interphalangeal joint (CPT [phone]) . 2.  Debridement of ulceration of right heel (CPT 11042) . 3.  Debridement of dystrophic nails (CPT 11720) . 4.  Trimming of nondystrophic nails (CPT 11719) . 5.  Paring of hyperkeratosis of the left foot (CPT 11055) .  ANESTHESIA General   HEMOSTASIS Pneumatic ankle tourniquet set at 250 mmHg  ESTIMATED BLOOD LOSS Minimal (<10 cc)  MATERIALS USED None  INJECTABLES 0.5% Marcaine plain  PATHOLOGY 1.  Bone from distal phalanx of right second toe for culture. 2.  Remaining portions of distal aspect of right second toe for pathology. 3.  Soft tissue from right heel ulceration for culture.  COMPLICATIONS None  INDICATIONS FOR PROCEDURES: Ulceration of the distal aspect of the right second toe with MRI findings consistent with osteomyelitis of the distal phalanx.  Painful, infected wound of the right heel.  Dystrophic nails of both great toes.  Long nails of all remaining digits.  Hyperkeratotic lesion along the medial aspect of the left hallux.  PROCEDURES IN DETAIL:  The patient was brought to the operating room and placed on the operative table in the supine position.  After general  anesthesia was administered and the patient intubated, the right foot was anesthetized with 0.5% Marcaine plain.  The foot was scrubbed prepped and draped in the usual sterile manner.  A second timeout was performed.  Attention was directed to the distal aspect of the right second toe.  2 converging semielliptical incisions were made encompassing the second toe circumferentially.  Dissection was continued deep down to the level of the distal phalangeal joint.  The distal interphalangeal joint was disarticulated and the distal aspect of the toe was passed from the operative field.  A portion of the distal phalanx was sent to microbiology for culture.  The remaining portion of the toe was sent to pathology for evaluation.  The articular surface of the middle phalanx was white and viable.  No erosive changes were noted.  The surgical wound was irrigated with copious amounts of sterile saline.  The skin was closed with 4-0 Prolene.  Attention was directed to the plantar aspect of the right heel.  A 1.4 x 2.1 x 0.2 cm ulceration was noted along the plantar aspect of the heel.  The wound bed was comprised of hypergranular tissue.  There was significant periwound maceration and hyperkeratotic tissue present.  The ulceration required debridement of the adherent, nonviable fibrotic tissue.  A sharp excisional debridement of the ulceration was performed using a #15 blade.  [Adherent, nonviable fibrotic tissue] including subcutaneous tissue was debrided and removed.  Soft tissue from the wound bed was removed and sent to microbiology for culture.  Following debridement, the ulceration measured 1.5 x 2.3 x 0.2 cm. A  sterile Betadine dressing was applied to the right foot.    Both great toenails were debrided without incident.  All remaining nails were trimmed without incident.  A hyperkeratotic lesion of the left hallux was pared without incident.  The patient tolerated all procedures well and anesthesia well.  She was  transferred from the operating room to the postanesthesia care unit with vital signs stable and vascular status intact to all digits of both feet.

## 2021-02-01 NOTE — Discharge Instructions (Signed)
.These instructions will give you an idea of what to expect after surgery and how to manage issues that may arise before your first post op office visit.  Pain Management Pain is best managed by "staying ahead" of it. If pain gets out of control, it is difficult to get it back under control. Local anesthesia that lasts 6-8 hours is used to numb the foot and decrease pain.  For the best pain control, take the pain medication every 4 hours for the first 2 days post op. On the third day pain medication can be taken as needed.   Post Op Nausea Nausea is common after surgery, so it is managed proactively.  If prescribed, use the prescribed nausea medication regularly for the first 2 days post op.  Bandages Do not worry if there is blood on the bandage. What looks like a lot of blood on the bandage is actually a small amount. Blood on the dressing spreads out as it is absorbed by the gauze, the same way a drop of water spreads out on a paper towel.  If the bandages feel wet or dry, stiff and uncomfortable, call the office during office hours and we will schedule a time for you to have the bandage changed.  Unless you are specifically told otherwise, we will do the first bandage change in the office.  Keep your bandage dry. If the bandage becomes wet or soiled, notify the office and we will schedule a time to change the bandage.  Activity It is best to spend most of the first 2 days after surgery lying down with the foot elevated above the level of your heart. You may put weight on your heel while wearing the surgical shoe.   You may only get up to go to the restroom.  Driving Do not drive until you are able to respond in an emergency (i.e. slam on the brakes). This usually occurs after the bone has healed - 6 to 8 weeks.  Call the Office If you have a fever over 101F.  If you have increasing pain after the initial post op pain has settled down.  If you have increasing redness, swelling, or  drainage.  If you have any questions or concerns.       General Anesthesia, Adult, Care After This sheet gives you information about how to care for yourself after your procedure. Your health care provider may also give you more specific instructions. If you have problems or questions, contact your health care provider. What can I expect after the procedure? After the procedure, the following side effects are common:  Pain or discomfort at the IV site.  Nausea.  Vomiting.  Sore throat.  Trouble concentrating.  Feeling cold or chills.  Feeling weak or tired.  Sleepiness and fatigue.  Soreness and body aches. These side effects can affect parts of the body that were not involved in surgery. Follow these instructions at home: For the time period you were told by your health care provider:  Rest.  Do not participate in activities where you could fall or become injured.  Do not drive or use machinery.  Do not drink alcohol.  Do not take sleeping pills or medicines that cause drowsiness.  Do not make important decisions or sign legal documents.  Do not take care of children on your own.   Eating and drinking  Follow any instructions from your health care provider about eating or drinking restrictions.  When you feel hungry, start by   eating small amounts of foods that are soft and easy to digest (bland), such as toast. Gradually return to your regular diet.  Drink enough fluid to keep your urine pale yellow.  If you vomit, rehydrate by drinking water, juice, or clear broth. General instructions  If you have sleep apnea, surgery and certain medicines can increase your risk for breathing problems. Follow instructions from your health care provider about wearing your sleep device: ? Anytime you are sleeping, including during daytime naps. ? While taking prescription pain medicines, sleeping medicines, or medicines that make you drowsy.  Have a responsible adult stay  with you for the time you are told. It is important to have someone help care for you until you are awake and alert.  Return to your normal activities as told by your health care provider. Ask your health care provider what activities are safe for you.  Take over-the-counter and prescription medicines only as told by your health care provider.  If you smoke, do not smoke without supervision.  Keep all follow-up visits as told by your health care provider. This is important. Contact a health care provider if:  You have nausea or vomiting that does not get better with medicine.  You cannot eat or drink without vomiting.  You have pain that does not get better with medicine.  You are unable to pass urine.  You develop a skin rash.  You have a fever.  You have redness around your IV site that gets worse. Get help right away if:  You have difficulty breathing.  You have chest pain.  You have blood in your urine or stool, or you vomit blood. Summary  After the procedure, it is common to have a sore throat or nausea. It is also common to feel tired.  Have a responsible adult stay with you for the time you are told. It is important to have someone help care for you until you are awake and alert.  When you feel hungry, start by eating small amounts of foods that are soft and easy to digest (bland), such as toast. Gradually return to your regular diet.  Drink enough fluid to keep your urine pale yellow.  Return to your normal activities as told by your health care provider. Ask your health care provider what activities are safe for you. This information is not intended to replace advice given to you by your health care provider. Make sure you discuss any questions you have with your health care provider. Document Revised: 06/08/2020 Document Reviewed: 01/06/2020 Elsevier Patient Education  2021 [employer]vier Inc.  

## 2021-02-02 ENCOUNTER — Encounter (HOSPITAL_COMMUNITY): Payer: Self-pay | Admitting: Podiatry

## 2021-02-02 DIAGNOSIS — T148XXA Other injury of unspecified body region, initial encounter: Secondary | ICD-10-CM | POA: Diagnosis not present

## 2021-02-02 DIAGNOSIS — L089 Local infection of the skin and subcutaneous tissue, unspecified: Secondary | ICD-10-CM | POA: Diagnosis not present

## 2021-02-02 DIAGNOSIS — E11628 Type 2 diabetes mellitus with other skin complications: Secondary | ICD-10-CM | POA: Diagnosis not present

## 2021-02-02 DIAGNOSIS — S31109A Unspecified open wound of abdominal wall, unspecified quadrant without penetration into peritoneal cavity, initial encounter: Secondary | ICD-10-CM | POA: Diagnosis not present

## 2021-02-04 DIAGNOSIS — L089 Local infection of the skin and subcutaneous tissue, unspecified: Secondary | ICD-10-CM | POA: Diagnosis not present

## 2021-02-04 DIAGNOSIS — T148XXA Other injury of unspecified body region, initial encounter: Secondary | ICD-10-CM | POA: Diagnosis not present

## 2021-02-04 DIAGNOSIS — E11628 Type 2 diabetes mellitus with other skin complications: Secondary | ICD-10-CM | POA: Diagnosis not present

## 2021-02-05 DIAGNOSIS — T148XXA Other injury of unspecified body region, initial encounter: Secondary | ICD-10-CM | POA: Diagnosis not present

## 2021-02-05 DIAGNOSIS — L089 Local infection of the skin and subcutaneous tissue, unspecified: Secondary | ICD-10-CM | POA: Diagnosis not present

## 2021-02-05 DIAGNOSIS — S31109A Unspecified open wound of abdominal wall, unspecified quadrant without penetration into peritoneal cavity, initial encounter: Secondary | ICD-10-CM | POA: Diagnosis not present

## 2021-02-05 DIAGNOSIS — E11628 Type 2 diabetes mellitus with other skin complications: Secondary | ICD-10-CM | POA: Diagnosis not present

## 2021-02-05 LAB — SURGICAL PATHOLOGY

## 2021-02-07 LAB — AEROBIC/ANAEROBIC CULTURE W GRAM STAIN (SURGICAL/DEEP WOUND): Gram Stain: NONE SEEN

## 2021-02-09 DIAGNOSIS — L97512 Non-pressure chronic ulcer of other part of right foot with fat layer exposed: Secondary | ICD-10-CM | POA: Diagnosis not present

## 2021-02-09 DIAGNOSIS — E11628 Type 2 diabetes mellitus with other skin complications: Secondary | ICD-10-CM | POA: Diagnosis not present

## 2021-02-09 DIAGNOSIS — E1142 Type 2 diabetes mellitus with diabetic polyneuropathy: Secondary | ICD-10-CM | POA: Diagnosis not present

## 2021-02-09 DIAGNOSIS — T148XXA Other injury of unspecified body region, initial encounter: Secondary | ICD-10-CM | POA: Diagnosis not present

## 2021-02-09 DIAGNOSIS — L089 Local infection of the skin and subcutaneous tissue, unspecified: Secondary | ICD-10-CM | POA: Diagnosis not present

## 2021-02-10 DIAGNOSIS — T148XXA Other injury of unspecified body region, initial encounter: Secondary | ICD-10-CM | POA: Diagnosis not present

## 2021-02-10 DIAGNOSIS — L089 Local infection of the skin and subcutaneous tissue, unspecified: Secondary | ICD-10-CM | POA: Diagnosis not present

## 2021-02-10 DIAGNOSIS — E11628 Type 2 diabetes mellitus with other skin complications: Secondary | ICD-10-CM | POA: Diagnosis not present

## 2021-02-13 DIAGNOSIS — T148XXA Other injury of unspecified body region, initial encounter: Secondary | ICD-10-CM | POA: Diagnosis not present

## 2021-02-13 DIAGNOSIS — L089 Local infection of the skin and subcutaneous tissue, unspecified: Secondary | ICD-10-CM | POA: Diagnosis not present

## 2021-02-13 DIAGNOSIS — E11628 Type 2 diabetes mellitus with other skin complications: Secondary | ICD-10-CM | POA: Diagnosis not present

## 2021-02-15 DIAGNOSIS — E11628 Type 2 diabetes mellitus with other skin complications: Secondary | ICD-10-CM | POA: Diagnosis not present

## 2021-02-15 DIAGNOSIS — L089 Local infection of the skin and subcutaneous tissue, unspecified: Secondary | ICD-10-CM | POA: Diagnosis not present

## 2021-02-15 DIAGNOSIS — T148XXA Other injury of unspecified body region, initial encounter: Secondary | ICD-10-CM | POA: Diagnosis not present

## 2021-02-16 DIAGNOSIS — E1142 Type 2 diabetes mellitus with diabetic polyneuropathy: Secondary | ICD-10-CM | POA: Diagnosis not present

## 2021-02-16 DIAGNOSIS — L97412 Non-pressure chronic ulcer of right heel and midfoot with fat layer exposed: Secondary | ICD-10-CM | POA: Diagnosis not present

## 2021-02-17 DIAGNOSIS — L089 Local infection of the skin and subcutaneous tissue, unspecified: Secondary | ICD-10-CM | POA: Diagnosis not present

## 2021-02-17 DIAGNOSIS — T148XXA Other injury of unspecified body region, initial encounter: Secondary | ICD-10-CM | POA: Diagnosis not present

## 2021-02-17 DIAGNOSIS — E11628 Type 2 diabetes mellitus with other skin complications: Secondary | ICD-10-CM | POA: Diagnosis not present

## 2021-02-19 DIAGNOSIS — L089 Local infection of the skin and subcutaneous tissue, unspecified: Secondary | ICD-10-CM | POA: Diagnosis not present

## 2021-02-19 DIAGNOSIS — E11628 Type 2 diabetes mellitus with other skin complications: Secondary | ICD-10-CM | POA: Diagnosis not present

## 2021-02-19 DIAGNOSIS — T148XXA Other injury of unspecified body region, initial encounter: Secondary | ICD-10-CM | POA: Diagnosis not present

## 2021-02-23 DIAGNOSIS — L089 Local infection of the skin and subcutaneous tissue, unspecified: Secondary | ICD-10-CM | POA: Diagnosis not present

## 2021-02-23 DIAGNOSIS — E11628 Type 2 diabetes mellitus with other skin complications: Secondary | ICD-10-CM | POA: Diagnosis not present

## 2021-02-23 DIAGNOSIS — E1142 Type 2 diabetes mellitus with diabetic polyneuropathy: Secondary | ICD-10-CM | POA: Diagnosis not present

## 2021-02-23 DIAGNOSIS — T148XXA Other injury of unspecified body region, initial encounter: Secondary | ICD-10-CM | POA: Diagnosis not present

## 2021-02-23 DIAGNOSIS — L97412 Non-pressure chronic ulcer of right heel and midfoot with fat layer exposed: Secondary | ICD-10-CM | POA: Diagnosis not present

## 2021-02-24 DIAGNOSIS — L089 Local infection of the skin and subcutaneous tissue, unspecified: Secondary | ICD-10-CM | POA: Diagnosis not present

## 2021-02-24 DIAGNOSIS — T148XXA Other injury of unspecified body region, initial encounter: Secondary | ICD-10-CM | POA: Diagnosis not present

## 2021-02-24 DIAGNOSIS — E11628 Type 2 diabetes mellitus with other skin complications: Secondary | ICD-10-CM | POA: Diagnosis not present

## 2021-02-26 DIAGNOSIS — E11628 Type 2 diabetes mellitus with other skin complications: Secondary | ICD-10-CM | POA: Diagnosis not present

## 2021-02-26 DIAGNOSIS — L089 Local infection of the skin and subcutaneous tissue, unspecified: Secondary | ICD-10-CM | POA: Diagnosis not present

## 2021-02-26 DIAGNOSIS — T148XXA Other injury of unspecified body region, initial encounter: Secondary | ICD-10-CM | POA: Diagnosis not present

## 2021-02-28 DIAGNOSIS — S31109A Unspecified open wound of abdominal wall, unspecified quadrant without penetration into peritoneal cavity, initial encounter: Secondary | ICD-10-CM | POA: Diagnosis not present

## 2021-02-28 DIAGNOSIS — L97211 Non-pressure chronic ulcer of right calf limited to breakdown of skin: Secondary | ICD-10-CM | POA: Diagnosis not present

## 2021-03-01 DIAGNOSIS — T148XXA Other injury of unspecified body region, initial encounter: Secondary | ICD-10-CM | POA: Diagnosis not present

## 2021-03-01 DIAGNOSIS — L089 Local infection of the skin and subcutaneous tissue, unspecified: Secondary | ICD-10-CM | POA: Diagnosis not present

## 2021-03-01 DIAGNOSIS — E11628 Type 2 diabetes mellitus with other skin complications: Secondary | ICD-10-CM | POA: Diagnosis not present

## 2021-03-01 DIAGNOSIS — S31109A Unspecified open wound of abdominal wall, unspecified quadrant without penetration into peritoneal cavity, initial encounter: Secondary | ICD-10-CM | POA: Diagnosis not present

## 2021-03-01 DIAGNOSIS — L97211 Non-pressure chronic ulcer of right calf limited to breakdown of skin: Secondary | ICD-10-CM | POA: Diagnosis not present

## 2021-03-02 DIAGNOSIS — L97211 Non-pressure chronic ulcer of right calf limited to breakdown of skin: Secondary | ICD-10-CM | POA: Diagnosis not present

## 2021-03-02 DIAGNOSIS — S31109A Unspecified open wound of abdominal wall, unspecified quadrant without penetration into peritoneal cavity, initial encounter: Secondary | ICD-10-CM | POA: Diagnosis not present

## 2021-03-02 DIAGNOSIS — L089 Local infection of the skin and subcutaneous tissue, unspecified: Secondary | ICD-10-CM | POA: Diagnosis not present

## 2021-03-02 DIAGNOSIS — E11628 Type 2 diabetes mellitus with other skin complications: Secondary | ICD-10-CM | POA: Diagnosis not present

## 2021-03-02 DIAGNOSIS — T148XXA Other injury of unspecified body region, initial encounter: Secondary | ICD-10-CM | POA: Diagnosis not present

## 2021-03-03 DIAGNOSIS — E11628 Type 2 diabetes mellitus with other skin complications: Secondary | ICD-10-CM | POA: Diagnosis not present

## 2021-03-03 DIAGNOSIS — T148XXA Other injury of unspecified body region, initial encounter: Secondary | ICD-10-CM | POA: Diagnosis not present

## 2021-03-03 DIAGNOSIS — L089 Local infection of the skin and subcutaneous tissue, unspecified: Secondary | ICD-10-CM | POA: Diagnosis not present

## 2021-03-09 DIAGNOSIS — E1142 Type 2 diabetes mellitus with diabetic polyneuropathy: Secondary | ICD-10-CM | POA: Diagnosis not present

## 2021-03-09 DIAGNOSIS — L97412 Non-pressure chronic ulcer of right heel and midfoot with fat layer exposed: Secondary | ICD-10-CM | POA: Diagnosis not present

## 2021-03-16 DIAGNOSIS — E1142 Type 2 diabetes mellitus with diabetic polyneuropathy: Secondary | ICD-10-CM | POA: Diagnosis not present

## 2021-03-16 DIAGNOSIS — L97412 Non-pressure chronic ulcer of right heel and midfoot with fat layer exposed: Secondary | ICD-10-CM | POA: Diagnosis not present

## 2021-03-19 DIAGNOSIS — L97412 Non-pressure chronic ulcer of right heel and midfoot with fat layer exposed: Secondary | ICD-10-CM | POA: Diagnosis not present

## 2021-03-23 DIAGNOSIS — E1142 Type 2 diabetes mellitus with diabetic polyneuropathy: Secondary | ICD-10-CM | POA: Diagnosis not present

## 2021-03-23 DIAGNOSIS — L97412 Non-pressure chronic ulcer of right heel and midfoot with fat layer exposed: Secondary | ICD-10-CM | POA: Diagnosis not present

## 2021-04-03 DIAGNOSIS — S31109A Unspecified open wound of abdominal wall, unspecified quadrant without penetration into peritoneal cavity, initial encounter: Secondary | ICD-10-CM | POA: Diagnosis not present

## 2021-04-04 DIAGNOSIS — I10 Essential (primary) hypertension: Secondary | ICD-10-CM | POA: Diagnosis not present

## 2021-04-04 DIAGNOSIS — E1143 Type 2 diabetes mellitus with diabetic autonomic (poly)neuropathy: Secondary | ICD-10-CM | POA: Diagnosis not present

## 2021-04-04 DIAGNOSIS — L89610 Pressure ulcer of right heel, unstageable: Secondary | ICD-10-CM | POA: Diagnosis not present

## 2021-04-04 DIAGNOSIS — E7849 Other hyperlipidemia: Secondary | ICD-10-CM | POA: Diagnosis not present

## 2021-04-05 DIAGNOSIS — S31109A Unspecified open wound of abdominal wall, unspecified quadrant without penetration into peritoneal cavity, initial encounter: Secondary | ICD-10-CM | POA: Diagnosis not present

## 2021-04-20 DIAGNOSIS — L97412 Non-pressure chronic ulcer of right heel and midfoot with fat layer exposed: Secondary | ICD-10-CM | POA: Diagnosis not present

## 2021-04-20 DIAGNOSIS — E1142 Type 2 diabetes mellitus with diabetic polyneuropathy: Secondary | ICD-10-CM | POA: Diagnosis not present

## 2021-05-02 DIAGNOSIS — S31109A Unspecified open wound of abdominal wall, unspecified quadrant without penetration into peritoneal cavity, initial encounter: Secondary | ICD-10-CM | POA: Diagnosis not present

## 2021-05-16 DIAGNOSIS — S31109A Unspecified open wound of abdominal wall, unspecified quadrant without penetration into peritoneal cavity, initial encounter: Secondary | ICD-10-CM | POA: Diagnosis not present

## 2021-05-28 DIAGNOSIS — S31109A Unspecified open wound of abdominal wall, unspecified quadrant without penetration into peritoneal cavity, initial encounter: Secondary | ICD-10-CM | POA: Diagnosis not present

## 2021-06-25 DIAGNOSIS — S31109A Unspecified open wound of abdominal wall, unspecified quadrant without penetration into peritoneal cavity, initial encounter: Secondary | ICD-10-CM | POA: Diagnosis not present

## 2021-06-28 DIAGNOSIS — S31109A Unspecified open wound of abdominal wall, unspecified quadrant without penetration into peritoneal cavity, initial encounter: Secondary | ICD-10-CM | POA: Diagnosis not present

## 2021-07-06 DIAGNOSIS — L97412 Non-pressure chronic ulcer of right heel and midfoot with fat layer exposed: Secondary | ICD-10-CM | POA: Diagnosis not present

## 2021-07-06 DIAGNOSIS — E1142 Type 2 diabetes mellitus with diabetic polyneuropathy: Secondary | ICD-10-CM | POA: Diagnosis not present

## 2021-07-27 DIAGNOSIS — L97412 Non-pressure chronic ulcer of right heel and midfoot with fat layer exposed: Secondary | ICD-10-CM | POA: Diagnosis not present

## 2021-07-27 DIAGNOSIS — E1142 Type 2 diabetes mellitus with diabetic polyneuropathy: Secondary | ICD-10-CM | POA: Diagnosis not present

## 2021-08-03 DIAGNOSIS — S31109A Unspecified open wound of abdominal wall, unspecified quadrant without penetration into peritoneal cavity, initial encounter: Secondary | ICD-10-CM | POA: Diagnosis not present

## 2021-08-07 DIAGNOSIS — I10 Essential (primary) hypertension: Secondary | ICD-10-CM | POA: Diagnosis not present

## 2021-08-07 DIAGNOSIS — E7849 Other hyperlipidemia: Secondary | ICD-10-CM | POA: Diagnosis not present

## 2021-08-07 DIAGNOSIS — E1143 Type 2 diabetes mellitus with diabetic autonomic (poly)neuropathy: Secondary | ICD-10-CM | POA: Diagnosis not present

## 2021-08-07 DIAGNOSIS — L89610 Pressure ulcer of right heel, unstageable: Secondary | ICD-10-CM | POA: Diagnosis not present

## 2021-08-09 DIAGNOSIS — S31109A Unspecified open wound of abdominal wall, unspecified quadrant without penetration into peritoneal cavity, initial encounter: Secondary | ICD-10-CM | POA: Diagnosis not present

## 2021-08-14 ENCOUNTER — Encounter: Payer: Self-pay | Admitting: Gastroenterology

## 2021-08-17 DIAGNOSIS — R52 Pain, unspecified: Secondary | ICD-10-CM | POA: Diagnosis not present

## 2021-08-17 DIAGNOSIS — L97412 Non-pressure chronic ulcer of right heel and midfoot with fat layer exposed: Secondary | ICD-10-CM | POA: Diagnosis not present

## 2021-08-17 DIAGNOSIS — E1142 Type 2 diabetes mellitus with diabetic polyneuropathy: Secondary | ICD-10-CM | POA: Diagnosis not present

## 2021-08-21 DIAGNOSIS — M79671 Pain in right foot: Secondary | ICD-10-CM | POA: Diagnosis not present

## 2021-08-21 DIAGNOSIS — E1142 Type 2 diabetes mellitus with diabetic polyneuropathy: Secondary | ICD-10-CM | POA: Diagnosis not present

## 2021-08-21 DIAGNOSIS — L97412 Non-pressure chronic ulcer of right heel and midfoot with fat layer exposed: Secondary | ICD-10-CM | POA: Diagnosis not present

## 2021-08-23 DIAGNOSIS — E11621 Type 2 diabetes mellitus with foot ulcer: Secondary | ICD-10-CM | POA: Diagnosis not present

## 2021-08-23 DIAGNOSIS — E1142 Type 2 diabetes mellitus with diabetic polyneuropathy: Secondary | ICD-10-CM | POA: Diagnosis not present

## 2021-08-23 DIAGNOSIS — L97419 Non-pressure chronic ulcer of right heel and midfoot with unspecified severity: Secondary | ICD-10-CM | POA: Diagnosis not present

## 2021-08-23 DIAGNOSIS — E1140 Type 2 diabetes mellitus with diabetic neuropathy, unspecified: Secondary | ICD-10-CM | POA: Diagnosis not present

## 2021-08-23 DIAGNOSIS — L97412 Non-pressure chronic ulcer of right heel and midfoot with fat layer exposed: Secondary | ICD-10-CM | POA: Diagnosis not present

## 2021-08-28 DIAGNOSIS — L97412 Non-pressure chronic ulcer of right heel and midfoot with fat layer exposed: Secondary | ICD-10-CM | POA: Diagnosis not present

## 2021-08-28 DIAGNOSIS — E1142 Type 2 diabetes mellitus with diabetic polyneuropathy: Secondary | ICD-10-CM | POA: Diagnosis not present

## 2021-09-04 DIAGNOSIS — L97519 Non-pressure chronic ulcer of other part of right foot with unspecified severity: Secondary | ICD-10-CM | POA: Diagnosis not present

## 2021-09-04 DIAGNOSIS — M129 Arthropathy, unspecified: Secondary | ICD-10-CM | POA: Diagnosis not present

## 2021-09-04 DIAGNOSIS — E559 Vitamin D deficiency, unspecified: Secondary | ICD-10-CM | POA: Diagnosis not present

## 2021-09-04 DIAGNOSIS — Z79899 Other long term (current) drug therapy: Secondary | ICD-10-CM | POA: Diagnosis not present

## 2021-09-04 DIAGNOSIS — G894 Chronic pain syndrome: Secondary | ICD-10-CM | POA: Diagnosis not present

## 2021-09-07 DIAGNOSIS — S31109A Unspecified open wound of abdominal wall, unspecified quadrant without penetration into peritoneal cavity, initial encounter: Secondary | ICD-10-CM | POA: Diagnosis not present

## 2021-09-18 DIAGNOSIS — E559 Vitamin D deficiency, unspecified: Secondary | ICD-10-CM | POA: Diagnosis not present

## 2021-09-18 DIAGNOSIS — Z79899 Other long term (current) drug therapy: Secondary | ICD-10-CM | POA: Diagnosis not present

## 2021-09-18 DIAGNOSIS — L97519 Non-pressure chronic ulcer of other part of right foot with unspecified severity: Secondary | ICD-10-CM | POA: Diagnosis not present

## 2021-09-18 DIAGNOSIS — G894 Chronic pain syndrome: Secondary | ICD-10-CM | POA: Diagnosis not present

## 2021-09-18 DIAGNOSIS — E1142 Type 2 diabetes mellitus with diabetic polyneuropathy: Secondary | ICD-10-CM | POA: Diagnosis not present

## 2021-09-18 DIAGNOSIS — L97412 Non-pressure chronic ulcer of right heel and midfoot with fat layer exposed: Secondary | ICD-10-CM | POA: Diagnosis not present

## 2021-09-28 DIAGNOSIS — S31109A Unspecified open wound of abdominal wall, unspecified quadrant without penetration into peritoneal cavity, initial encounter: Secondary | ICD-10-CM | POA: Diagnosis not present

## 2021-10-04 DIAGNOSIS — S31109A Unspecified open wound of abdominal wall, unspecified quadrant without penetration into peritoneal cavity, initial encounter: Secondary | ICD-10-CM | POA: Diagnosis not present

## 2021-10-16 DIAGNOSIS — L97519 Non-pressure chronic ulcer of other part of right foot with unspecified severity: Secondary | ICD-10-CM | POA: Diagnosis not present

## 2021-10-16 DIAGNOSIS — Z6841 Body Mass Index (BMI) 40.0 and over, adult: Secondary | ICD-10-CM | POA: Diagnosis not present

## 2021-10-16 DIAGNOSIS — G894 Chronic pain syndrome: Secondary | ICD-10-CM | POA: Diagnosis not present

## 2021-10-16 DIAGNOSIS — Z79899 Other long term (current) drug therapy: Secondary | ICD-10-CM | POA: Diagnosis not present

## 2021-10-16 DIAGNOSIS — E559 Vitamin D deficiency, unspecified: Secondary | ICD-10-CM | POA: Diagnosis not present

## 2021-10-26 DIAGNOSIS — S31109A Unspecified open wound of abdominal wall, unspecified quadrant without penetration into peritoneal cavity, initial encounter: Secondary | ICD-10-CM | POA: Diagnosis not present

## 2021-11-14 DIAGNOSIS — L97519 Non-pressure chronic ulcer of other part of right foot with unspecified severity: Secondary | ICD-10-CM | POA: Diagnosis not present

## 2021-11-14 DIAGNOSIS — E119 Type 2 diabetes mellitus without complications: Secondary | ICD-10-CM | POA: Diagnosis not present

## 2021-11-14 DIAGNOSIS — E559 Vitamin D deficiency, unspecified: Secondary | ICD-10-CM | POA: Diagnosis not present

## 2021-11-14 DIAGNOSIS — Z79899 Other long term (current) drug therapy: Secondary | ICD-10-CM | POA: Diagnosis not present

## 2021-11-14 DIAGNOSIS — Z6841 Body Mass Index (BMI) 40.0 and over, adult: Secondary | ICD-10-CM | POA: Diagnosis not present

## 2021-11-14 DIAGNOSIS — G894 Chronic pain syndrome: Secondary | ICD-10-CM | POA: Diagnosis not present

## 2021-12-12 DIAGNOSIS — E119 Type 2 diabetes mellitus without complications: Secondary | ICD-10-CM | POA: Diagnosis not present

## 2021-12-12 DIAGNOSIS — R030 Elevated blood-pressure reading, without diagnosis of hypertension: Secondary | ICD-10-CM | POA: Diagnosis not present

## 2021-12-12 DIAGNOSIS — Z79899 Other long term (current) drug therapy: Secondary | ICD-10-CM | POA: Diagnosis not present

## 2021-12-12 DIAGNOSIS — E559 Vitamin D deficiency, unspecified: Secondary | ICD-10-CM | POA: Diagnosis not present

## 2021-12-12 DIAGNOSIS — Z6841 Body Mass Index (BMI) 40.0 and over, adult: Secondary | ICD-10-CM | POA: Diagnosis not present

## 2021-12-12 DIAGNOSIS — L97519 Non-pressure chronic ulcer of other part of right foot with unspecified severity: Secondary | ICD-10-CM | POA: Diagnosis not present

## 2021-12-12 DIAGNOSIS — G894 Chronic pain syndrome: Secondary | ICD-10-CM | POA: Diagnosis not present

## 2021-12-18 DIAGNOSIS — I10 Essential (primary) hypertension: Secondary | ICD-10-CM | POA: Diagnosis not present

## 2021-12-18 DIAGNOSIS — E7849 Other hyperlipidemia: Secondary | ICD-10-CM | POA: Diagnosis not present

## 2021-12-18 DIAGNOSIS — E1143 Type 2 diabetes mellitus with diabetic autonomic (poly)neuropathy: Secondary | ICD-10-CM | POA: Diagnosis not present

## 2021-12-18 DIAGNOSIS — L89610 Pressure ulcer of right heel, unstageable: Secondary | ICD-10-CM | POA: Diagnosis not present

## 2021-12-20 DIAGNOSIS — S31109A Unspecified open wound of abdominal wall, unspecified quadrant without penetration into peritoneal cavity, initial encounter: Secondary | ICD-10-CM | POA: Diagnosis not present

## 2021-12-31 NOTE — Progress Notes (Deleted)
? ?Referring Provider: Toma Deiters, MD ?Primary Care Physician:  Toma Deiters, MD ?Primary Gastroenterologist:  Dr. Jena Gauss ? ?No chief complaint on file. ? ? ?HPI:   ?[name] is a 56 y.o. female presenting today at the request of Hasanaj, Myra Gianotti, MD for consult colonoscopy. ? ?Past Medical History:  ?Diagnosis Date  ? Back pain   ? Cellulitis   ? Chronic GERD   ? Diabetes mellitus without complication (HCC)   ? Hypertension   ? ? ?Past Surgical History:  ?Procedure Laterality Date  ? AMPUTATION Right 02/01/2021  ? Procedure: PARTIAL OR COMPLETE AMPUTATION RIGHT 2ND TOE;  Surgeon: Ferman Hamming, DPM;  Location: [facility];  Service: Podiatry;  Laterality: Right;  ? CHOLECYSTECTOMY    ? ENDOMETRIAL ABLATION    ? IRRIGATION AND DEBRIDEMENT FOOT Right 02/01/2021  ? Procedure: DEBRIDEMENT OF ULCERATION RIGHT HEEL;  Surgeon: Ferman Hamming, DPM;  Location: [facility];  Service: Podiatry;  Laterality: Right;  ? ORIF ANKLE FRACTURE Right   ? THYROID SURGERY Left   ? WOUND DEBRIDEMENT Bilateral 02/01/2021  ? Procedure: DEBRIDEMENT OF NAILS OF BOTH FEET AND DEBRIDEMENT OF A CALLUS OF THE LEFT FOOT;  Surgeon: Ferman Hamming, DPM;  Location: [facility];  Service: Podiatry;  Laterality: Bilateral;  ? ? ?Current Outpatient Medications  ?Medication Sig Dispense Refill  ? ARIPiprazole (ABILIFY) 5 MG tablet Take 5 mg by mouth daily.    ? escitalopram (LEXAPRO) 20 MG tablet Take 1 tablet by mouth daily.    ? JARDIANCE 25 MG TABS tablet Take 25 mg by mouth daily.    ? ketoconazole (NIZORAL) 2 % shampoo Apply 1 application topically 2 (two) times a week.    ? lisinopril (ZESTRIL) 20 MG tablet Take 1 tablet by mouth daily.    ? Multiple Vitamin (MULTI-VITAMIN) tablet Take 1 tablet by mouth daily.    ? nystatin (MYCOSTATIN/NYSTOP) powder Apply topically 2 (two) times daily. to affected area(s)    ? Omega-3 1000 MG CAPS Take 2 capsules by mouth daily.    ? pantoprazole (PROTONIX) 40 MG tablet Take 1 tablet by mouth daily.    ?  sitaGLIPtin-metformin (JANUMET) 50-500 MG tablet Take 1 tablet by mouth 2 (two) times daily with a meal.    ? triamcinolone lotion (KENALOG) 0.1 % Apply 1 application topically daily.    ? ?No current facility-administered medications for this visit.  ? ? ?Allergies as of 01/02/2022  ? (No Known Allergies)  ? ? ?No family history on file. ? ?Social History  ? ?Socioeconomic History  ? Marital status: Married  ?  Spouse name: Not on file  ? Number of children: Not on file  ? Years of education: Not on file  ? Highest education level: Not on file  ?Occupational History  ? Not on file  ?Tobacco Use  ? Smoking status: Never  ? Smokeless tobacco: Never  ?Vaping Use  ? Vaping Use: Never used  ?Substance and Sexual Activity  ? Alcohol use: Never  ? Drug use: Never  ? Sexual activity: Not on file  ?Other Topics Concern  ? Not on file  ?Social History Narrative  ? ** Merged History Encounter **  ?    ? ?Social Determinants of Health  ? ?Financial Resource Strain: Not on file  ?Food Insecurity: Not on file  ?Transportation Needs: Not on file  ?Physical Activity: Not on file  ?Stress: Not on file  ?Social Connections: Not on file  ?Intimate Partner Violence: Not on  file  ? ? ?Review of Systems: ?Gen: Denies any fever, chills, fatigue, weight loss, lack of appetite.  ?CV: Denies chest pain, heart palpitations, peripheral edema, syncope.  ?Resp: Denies shortness of breath at rest or with exertion. Denies wheezing or cough.  ?GI: Denies dysphagia or odynophagia. Denies jaundice, hematemesis, fecal incontinence. ?GU : Denies urinary burning, urinary frequency, urinary hesitancy ?MS: Denies joint pain, muscle weakness, cramps, or limitation of movement.  ?Derm: Denies rash, itching, dry skin ?Psych: Denies depression, anxiety, memory loss, and confusion ?Heme: Denies bruising, bleeding, and enlarged lymph nodes. ? ?Physical Exam: ?There were no vitals taken for this visit. ?General:   Alert and oriented. Pleasant and cooperative.  Well-nourished and well-developed.  ?Head:  Normocephalic and atraumatic. ?Eyes:  Without icterus, sclera clear and conjunctiva pink.  ?Ears:  Normal auditory acuity. ?Lungs:  Clear to auscultation bilaterally. No wheezes, rales, or rhonchi. No distress.  ?Heart:  S1, S2 present without murmurs appreciated.  ?Abdomen:  +BS, soft, non-tender and non-distended. No HSM noted. No guarding or rebound. No masses appreciated.  ?Rectal:  Deferred  ?Msk:  Symmetrical without gross deformities. Normal posture. ?Extremities:  Without edema. ?Neurologic:  Alert and  oriented x4;  grossly normal neurologically. ?Skin:  Intact without significant lesions or rashes. ?Psych:  Alert and cooperative. Normal mood and affect. ? ? ? ?Assessment:  ? ? ? ?Plan:  ?*** ? ? ?Ermalinda Memos, PA-C ?[facility] ?01/02/2022 ?  ?

## 2022-01-02 ENCOUNTER — Ambulatory Visit: Payer: BC Managed Care – PPO | Admitting: Gastroenterology

## 2022-01-16 DIAGNOSIS — L97519 Non-pressure chronic ulcer of other part of right foot with unspecified severity: Secondary | ICD-10-CM | POA: Diagnosis not present

## 2022-01-16 DIAGNOSIS — E119 Type 2 diabetes mellitus without complications: Secondary | ICD-10-CM | POA: Diagnosis not present

## 2022-01-16 DIAGNOSIS — Z6841 Body Mass Index (BMI) 40.0 and over, adult: Secondary | ICD-10-CM | POA: Diagnosis not present

## 2022-01-16 DIAGNOSIS — Z79899 Other long term (current) drug therapy: Secondary | ICD-10-CM | POA: Diagnosis not present

## 2022-01-16 DIAGNOSIS — E559 Vitamin D deficiency, unspecified: Secondary | ICD-10-CM | POA: Diagnosis not present

## 2022-01-16 DIAGNOSIS — R030 Elevated blood-pressure reading, without diagnosis of hypertension: Secondary | ICD-10-CM | POA: Diagnosis not present

## 2022-01-16 DIAGNOSIS — G894 Chronic pain syndrome: Secondary | ICD-10-CM | POA: Diagnosis not present

## 2022-01-17 DIAGNOSIS — S31109A Unspecified open wound of abdominal wall, unspecified quadrant without penetration into peritoneal cavity, initial encounter: Secondary | ICD-10-CM | POA: Diagnosis not present

## 2022-01-21 DIAGNOSIS — Z79899 Other long term (current) drug therapy: Secondary | ICD-10-CM | POA: Diagnosis not present

## 2022-01-22 DIAGNOSIS — L859 Epidermal thickening, unspecified: Secondary | ICD-10-CM | POA: Diagnosis not present

## 2022-01-22 DIAGNOSIS — E1142 Type 2 diabetes mellitus with diabetic polyneuropathy: Secondary | ICD-10-CM | POA: Diagnosis not present

## 2022-01-22 DIAGNOSIS — L603 Nail dystrophy: Secondary | ICD-10-CM | POA: Diagnosis not present

## 2022-02-14 DIAGNOSIS — E119 Type 2 diabetes mellitus without complications: Secondary | ICD-10-CM | POA: Diagnosis not present

## 2022-02-14 DIAGNOSIS — Z6841 Body Mass Index (BMI) 40.0 and over, adult: Secondary | ICD-10-CM | POA: Diagnosis not present

## 2022-02-14 DIAGNOSIS — Z79899 Other long term (current) drug therapy: Secondary | ICD-10-CM | POA: Diagnosis not present

## 2022-02-14 DIAGNOSIS — L97519 Non-pressure chronic ulcer of other part of right foot with unspecified severity: Secondary | ICD-10-CM | POA: Diagnosis not present

## 2022-02-14 DIAGNOSIS — G894 Chronic pain syndrome: Secondary | ICD-10-CM | POA: Diagnosis not present

## 2022-02-14 DIAGNOSIS — E559 Vitamin D deficiency, unspecified: Secondary | ICD-10-CM | POA: Diagnosis not present

## 2022-02-14 DIAGNOSIS — R030 Elevated blood-pressure reading, without diagnosis of hypertension: Secondary | ICD-10-CM | POA: Diagnosis not present

## 2022-02-18 DIAGNOSIS — Z79899 Other long term (current) drug therapy: Secondary | ICD-10-CM | POA: Diagnosis not present

## 2022-02-19 DIAGNOSIS — Z1231 Encounter for screening mammogram for malignant neoplasm of breast: Secondary | ICD-10-CM | POA: Diagnosis not present

## 2022-02-25 DIAGNOSIS — S31109A Unspecified open wound of abdominal wall, unspecified quadrant without penetration into peritoneal cavity, initial encounter: Secondary | ICD-10-CM | POA: Diagnosis not present

## 2022-03-18 DIAGNOSIS — Z6841 Body Mass Index (BMI) 40.0 and over, adult: Secondary | ICD-10-CM | POA: Diagnosis not present

## 2022-03-18 DIAGNOSIS — E119 Type 2 diabetes mellitus without complications: Secondary | ICD-10-CM | POA: Diagnosis not present

## 2022-03-18 DIAGNOSIS — L97519 Non-pressure chronic ulcer of other part of right foot with unspecified severity: Secondary | ICD-10-CM | POA: Diagnosis not present

## 2022-03-18 DIAGNOSIS — E559 Vitamin D deficiency, unspecified: Secondary | ICD-10-CM | POA: Diagnosis not present

## 2022-03-18 DIAGNOSIS — R030 Elevated blood-pressure reading, without diagnosis of hypertension: Secondary | ICD-10-CM | POA: Diagnosis not present

## 2022-03-18 DIAGNOSIS — G894 Chronic pain syndrome: Secondary | ICD-10-CM | POA: Diagnosis not present

## 2022-03-18 DIAGNOSIS — Z79899 Other long term (current) drug therapy: Secondary | ICD-10-CM | POA: Diagnosis not present

## 2022-03-21 ENCOUNTER — Emergency Department (HOSPITAL_COMMUNITY): Payer: BC Managed Care – PPO

## 2022-03-21 ENCOUNTER — Emergency Department (HOSPITAL_COMMUNITY)
Admission: EM | Admit: 2022-03-21 | Discharge: 2022-03-21 | Disposition: A | Discharge status: Home / Self Care | Payer: BC Managed Care – PPO | Attending: Emergency Medicine | Admitting: Emergency Medicine

## 2022-03-21 ENCOUNTER — Encounter (HOSPITAL_COMMUNITY): Payer: Self-pay | Admitting: *Deleted

## 2022-03-21 ENCOUNTER — Other Ambulatory Visit: Payer: Self-pay

## 2022-03-21 DIAGNOSIS — Z5189 Encounter for other specified aftercare: Secondary | ICD-10-CM

## 2022-03-21 DIAGNOSIS — Z7984 Long term (current) use of oral hypoglycemic drugs: Secondary | ICD-10-CM | POA: Diagnosis not present

## 2022-03-21 DIAGNOSIS — E119 Type 2 diabetes mellitus without complications: Secondary | ICD-10-CM | POA: Insufficient documentation

## 2022-03-21 DIAGNOSIS — Z4801 Encounter for change or removal of surgical wound dressing: Secondary | ICD-10-CM | POA: Diagnosis not present

## 2022-03-21 DIAGNOSIS — Z4800 Encounter for change or removal of nonsurgical wound dressing: Secondary | ICD-10-CM | POA: Diagnosis not present

## 2022-03-21 DIAGNOSIS — Z9889 Other specified postprocedural states: Secondary | ICD-10-CM | POA: Diagnosis not present

## 2022-03-21 DIAGNOSIS — M7731 Calcaneal spur, right foot: Secondary | ICD-10-CM | POA: Diagnosis not present

## 2022-03-21 DIAGNOSIS — S91301A Unspecified open wound, right foot, initial encounter: Secondary | ICD-10-CM | POA: Diagnosis not present

## 2022-03-21 LAB — BASIC METABOLIC PANEL
Anion gap: 8 (ref 5–15)
BUN: 14 mg/dL (ref 6–20)
CO2: 26 mmol/L (ref 22–32)
Calcium: 8.7 mg/dL — ABNORMAL LOW (ref 8.9–10.3)
Chloride: 103 mmol/L (ref 98–111)
Creatinine, Ser: 0.42 mg/dL — ABNORMAL LOW (ref 0.44–1.00)
GFR, Estimated: >60 mL/min (ref >60)
Glucose, Bld: 141 mg/dL — ABNORMAL HIGH (ref 70–99)
Potassium: 3.8 mmol/L (ref 3.5–5.1)
Sodium: 137 mmol/L (ref 135–145)

## 2022-03-21 LAB — CBC WITH DIFFERENTIAL/PLATELET
Abs Immature Granulocytes: 0.04 10*3/uL (ref 0.00–0.07)
Basophils Absolute: 0 10*3/uL (ref 0.0–0.1)
Basophils Relative: 1 %
Eosinophils Absolute: 0.2 10*3/uL (ref 0.0–0.5)
Eosinophils Relative: 3 %
HCT: 40.3 % (ref 36.0–46.0)
Hemoglobin: 13.1 g/dL (ref 12.0–15.0)
Immature Granulocytes: 1 %
Lymphocytes Relative: 13 %
Lymphs Abs: 1 10*3/uL (ref 0.7–4.0)
MCH: 27.9 pg (ref 26.0–34.0)
MCHC: 32.5 g/dL (ref 30.0–36.0)
MCV: 85.9 fL (ref 80.0–100.0)
Monocytes Absolute: 1 10*3/uL (ref 0.1–1.0)
Monocytes Relative: 13 %
Neutro Abs: 5.3 10*3/uL (ref 1.7–7.7)
Neutrophils Relative %: 69 %
Platelets: 361 10*3/uL (ref 150–400)
RBC: 4.69 MIL/uL (ref 3.87–5.11)
RDW: 13.9 % (ref 11.5–15.5)
WBC: 7.6 10*3/uL (ref 4.0–10.5)
nRBC: 0 % (ref 0.0–0.2)

## 2022-03-21 LAB — CBG MONITORING, ED: Glucose-Capillary: 136 mg/dL — ABNORMAL HIGH (ref 70–99)

## 2022-03-21 LAB — LACTIC ACID, PLASMA: Lactic Acid, Venous: 1.2 mmol/L (ref 0.5–1.9)

## 2022-03-21 NOTE — ED Provider Notes (Signed)
[facility] EMERGENCY DEPARTMENT Provider Note   CSN: [id number] Arrival date & time: 03/21/22  1804     History  Chief Complaint  Patient presents with   Wound Check    [name] is a 56 y.o. female with a past medical history of morbid obesity, osteomyelitis presenting today with right foot wound.  She reports that she has had a diabetic foot ulcer to the right heel for a few weeks.  It was being treated outpatient by wound care center and she changes the bandages at home as well.  Over the past 2 days she has noted increased drainage and today she called her wound office.  They sent her antibiotics to the pharmacy but she got concerned about the amount of drainage and decided to come here instead.  No fevers, chills, numbness, tingling or loss of sensation.  Says that the wound itself is not painful but the heel behind it is.  Blood sugars have been stable at home she says   Wound Check       Home Medications Prior to Admission medications   Medication Sig Start Date End Date Taking? Authorizing Provider  ARIPiprazole (ABILIFY) 5 MG tablet Take 5 mg by mouth daily. 03/18/19   [provider]  escitalopram (LEXAPRO) 20 MG tablet Take 1 tablet by mouth daily. 11/03/20   [provider]  JARDIANCE 25 MG TABS tablet Take 25 mg by mouth daily. 12/30/20   [provider]  ketoconazole (NIZORAL) 2 % shampoo Apply 1 application topically 2 (two) times a week. 12/31/18   [provider]  lisinopril (ZESTRIL) 20 MG tablet Take 1 tablet by mouth daily. 10/13/20   [provider]  Multiple Vitamin (MULTI-VITAMIN) tablet Take 1 tablet by mouth daily.    [provider]  nystatin (MYCOSTATIN/NYSTOP) powder Apply topically 2 (two) times daily. to affected area(s) 08/08/20   [provider]  Omega-3 1000 MG CAPS Take 2 capsules by mouth daily.    [provider]  pantoprazole (PROTONIX) 40 MG tablet Take 1 tablet by mouth daily.  12/05/20   [provider]  sitaGLIPtin-metformin (JANUMET) 50-500 MG tablet Take 1 tablet by mouth 2 (two) times daily with a meal.    [provider]  triamcinolone lotion (KENALOG) 0.1 % Apply 1 application topically daily.    [provider]      Allergies    Patient has no known allergies.    Review of Systems   Review of Systems  Physical Exam Updated Vital Signs BP 137/77 (BP Location: Right Arm)   Pulse (!) 105   Temp 98 F (36.7 C) (Oral)   Resp 16   Ht 5\' 5"  (1.651 m)   Wt (!) 156.5 kg   SpO2 96%   BMI 57.41 kg/m  Physical Exam Vitals and nursing note reviewed.  Constitutional:      Appearance: Normal appearance.  HENT:     Head: Normocephalic and atraumatic.  Eyes:     General: No scleral icterus.    Conjunctiva/sclera: Conjunctivae normal.  Cardiovascular:     Pulses: Normal pulses.  Pulmonary:     Effort: Pulmonary effort is normal. No respiratory distress.  Musculoskeletal:     Comments: Full range of motion of the ankle and all MTPs.  Strong DP pulse  Skin:    Findings: No rash.  Neurological:     Mental Status: She is alert.  Psychiatric:        Mood and Affect:  Mood normal.     Media Information    ED Results / Procedures / Treatments   Labs (all labs ordered are listed, but only abnormal results are displayed) Labs Reviewed  BASIC METABOLIC PANEL - Abnormal; Notable for the following components:      Result Value   Glucose, Bld 141 (*)    Creatinine, Ser 0.42 (*)    Calcium 8.7 (*)    All other components within normal limits  CBG MONITORING, ED - Abnormal; Notable for the following components:   Glucose-Capillary 136 (*)    All other components within normal limits  CULTURE, BLOOD (ROUTINE X 2)  CULTURE, BLOOD (ROUTINE X 2)  CBC WITH DIFFERENTIAL/PLATELET  LACTIC ACID, PLASMA  LACTIC ACID, PLASMA    EKG None  Radiology No results found.  Procedures Procedures   Medications Ordered in  ED Medications - No data to display  ED Course/ Medical Decision Making/ A&P                           Medical Decision Making Amount and/or Complexity of Data Reviewed Labs: ordered. Radiology: ordered.   This patient presents to the ED for concern of diabetic foot ulcer.  Differential includes but is not limited to superficial wound, cellulitis, osteomyelitis   This is not an exhaustive differential.    Past Medical History / Co-morbidities / Social History: Obesity and type 2 diabetes  Physical Exam: Pertinent physical exam findings include diabetic foot ulcer to the right heel  Lab Tests: I ordered, and personally interpreted labs.  The pertinent results include:  -Normal lactic -No white count   Imaging Studies: I ordered and independently visualized and interpreted imaging which showed superficial wound, no signs of osteomyelitis. I agree with the radiologist interpretation.  Disposition: After consideration of the diagnostic results and the patients response to treatment, I feel that patient is stable for discharge home.   I discussed this case with my attending physician Dr. Renaye Rakers who cosigned this note including patient's presenting symptoms, physical exam, and planned diagnostics and interventions. Attending physician stated agreement with plan or made changes to plan which were implemented.     Final Clinical Impression(s) / ED Diagnoses Final diagnoses:  Visit for wound check    Rx / DC Orders   Results and diagnoses were explained to the patient. Return precautions discussed in full. Patient had no additional questions and expressed complete understanding.   This chart was dictated using voice recognition software.  Despite best efforts to proofread,  errors can occur which can change the documentation meaning.    Woodroe Chen 03/21/22 2045    Terald Sleeper, MD 03/21/22 [phone]

## 2022-03-21 NOTE — Discharge Instructions (Signed)
Take the Clindamycin that was sent by your wound doctor.  It is okay to start them in the morning.  Be sure to follow-up with them early next week as discussed.

## 2022-03-21 NOTE — ED Triage Notes (Signed)
Pt with wound to right heel, pain with ambulation. Pt seeing wound care center at [facility] in [place].

## 2022-03-22 DIAGNOSIS — Z79899 Other long term (current) drug therapy: Secondary | ICD-10-CM | POA: Diagnosis not present

## 2022-03-26 LAB — CULTURE, BLOOD (ROUTINE X 2)
Culture: NO GROWTH
Culture: NO GROWTH
Special Requests: ADEQUATE

## 2022-04-01 DIAGNOSIS — S31109A Unspecified open wound of abdominal wall, unspecified quadrant without penetration into peritoneal cavity, initial encounter: Secondary | ICD-10-CM | POA: Diagnosis not present

## 2022-04-02 DIAGNOSIS — E1142 Type 2 diabetes mellitus with diabetic polyneuropathy: Secondary | ICD-10-CM | POA: Diagnosis not present

## 2022-04-02 DIAGNOSIS — L603 Nail dystrophy: Secondary | ICD-10-CM | POA: Diagnosis not present

## 2022-04-17 DIAGNOSIS — R030 Elevated blood-pressure reading, without diagnosis of hypertension: Secondary | ICD-10-CM | POA: Diagnosis not present

## 2022-04-17 DIAGNOSIS — G894 Chronic pain syndrome: Secondary | ICD-10-CM | POA: Diagnosis not present

## 2022-04-17 DIAGNOSIS — E119 Type 2 diabetes mellitus without complications: Secondary | ICD-10-CM | POA: Diagnosis not present

## 2022-04-17 DIAGNOSIS — L97519 Non-pressure chronic ulcer of other part of right foot with unspecified severity: Secondary | ICD-10-CM | POA: Diagnosis not present

## 2022-04-17 DIAGNOSIS — E559 Vitamin D deficiency, unspecified: Secondary | ICD-10-CM | POA: Diagnosis not present

## 2022-04-17 DIAGNOSIS — Z6841 Body Mass Index (BMI) 40.0 and over, adult: Secondary | ICD-10-CM | POA: Diagnosis not present

## 2022-04-17 DIAGNOSIS — Z79899 Other long term (current) drug therapy: Secondary | ICD-10-CM | POA: Diagnosis not present

## 2022-04-22 DIAGNOSIS — Z79899 Other long term (current) drug therapy: Secondary | ICD-10-CM | POA: Diagnosis not present

## 2022-05-02 DIAGNOSIS — S31109A Unspecified open wound of abdominal wall, unspecified quadrant without penetration into peritoneal cavity, initial encounter: Secondary | ICD-10-CM | POA: Diagnosis not present

## 2022-05-22 ENCOUNTER — Telehealth: Payer: Self-pay | Admitting: *Deleted

## 2022-05-22 NOTE — Chronic Care Management (AMB) (Signed)
  Care Coordination  Outreach Note  05/22/2022 Name: Mazell Aylesworth MRN: [id number] DOB: [birth year]   Care Coordination Outreach Attempts  An unsuccessful telephone outreach was attempted today to offer the patient information about available care coordination services as a benefit of their health plan.   Follow Up Plan:  Additional outreach attempts will be made to offer the patient care coordination information and services.   Encounter Outcome:  No Answer  Gwenevere Ghazi  Care Coordination Care Guide  Direct Dial: [phone]

## 2022-05-29 DIAGNOSIS — E7849 Other hyperlipidemia: Secondary | ICD-10-CM | POA: Diagnosis not present

## 2022-05-29 DIAGNOSIS — E1143 Type 2 diabetes mellitus with diabetic autonomic (poly)neuropathy: Secondary | ICD-10-CM | POA: Diagnosis not present

## 2022-05-29 DIAGNOSIS — L0390 Cellulitis, unspecified: Secondary | ICD-10-CM | POA: Diagnosis not present

## 2022-05-29 DIAGNOSIS — I10 Essential (primary) hypertension: Secondary | ICD-10-CM | POA: Diagnosis not present

## 2022-05-29 DIAGNOSIS — L89610 Pressure ulcer of right heel, unstageable: Secondary | ICD-10-CM | POA: Diagnosis not present

## 2022-05-30 NOTE — Chronic Care Management (AMB) (Signed)
  Care Coordination  Outreach Note  05/30/2022 Name: [name] MRN: [id number] DOB: [birth year]   Care Coordination Outreach Attempts  A second unsuccessful outreach was attempted today to offer the patient with information about available care coordination services as a benefit of their health plan.     Follow Up Plan:  Additional outreach attempts will be made to offer the patient care coordination information and services.   Encounter Outcome:  No Answer  Gwenevere Ghazi  Care Coordination Care Guide  Direct Dial: [phone]

## 2022-05-30 NOTE — Chronic Care Management (AMB) (Signed)
  Care Coordination   Note   05/30/2022 Name: Misbah Hornaday MRN: [id number] DOB: Oct 24, 1965  Ayianna Darnold is a 56 y.o. year old female who sees Hasanaj, Myra Gianotti, MD for primary care. I reached out to Darral Dash by phone today to offer care coordination services.  Ms. Maiolo was given information about Care Coordination services today including:   The Care Coordination services include support from the care team which includes your Nurse Coordinator, Clinical Social Worker, or Pharmacist.  The Care Coordination team is here to help remove barriers to the health concerns and goals most important to you. Care Coordination services are voluntary, and the patient may decline or stop services at any time by request to their care team member.   Care Coordination Consent Status: Patient agreed to services and verbal consent obtained.   Follow up plan:  Telephone appointment with care coordination team member scheduled for:  05/31/22  Encounter Outcome:  Pt. Scheduled

## 2022-05-31 ENCOUNTER — Ambulatory Visit: Payer: Self-pay | Admitting: *Deleted

## 2022-05-31 ENCOUNTER — Encounter: Payer: Self-pay | Admitting: *Deleted

## 2022-05-31 NOTE — Patient Instructions (Signed)
Visit Information  Thank you for taking time to visit with me today. Please don't hesitate to contact me if I can be of assistance to you before our next scheduled telephone appointment.  Following are the goals we discussed today:  Monitor blood sugars daily.  Continue weight watchers  Our next appointment is by telephone on 10/19  Please call the care guide team at [phone] if you need to cancel or reschedule your appointment.   Please call the Suicide and Crisis Lifeline: 988 call the [place] National Suicide Prevention Lifeline: 270-033-5015 or TTY: 320 239 6428 TTY (951)006-0783) to talk to a trained counselor call 1-800-273-TALK (toll free, 24 hour hotline) call the [facility]: ([phone] call 911 if you are experiencing a Mental Health or Behavioral Health Crisis or need someone to talk to.  The patient verbalized understanding of instructions, educational materials, and care plan provided today and agreed to receive a mailed copy of patient instructions, educational materials, and care plan.   The patient has been provided with contact information for the care management team and has been advised to call with any health related questions or concerns.   Kemper Durie, RN, MSN, [facility] Care Management Care Management Coordinator [phone]

## 2022-05-31 NOTE — Patient Outreach (Signed)
  Care Coordination   Initial Visit Note   05/31/2022 Name: [name] MRN: [id number] DOB: [birth year]  [name] is a 56 y.o. year old female who sees Hasanaj, Myra Gianotti, MD for primary care. I spoke with  Darral Dash by phone today.  What matters to the patients health and wellness today?  Report controlling DM and foot ulcer healing are biggest concerns.  She had plantar fascitis, received injection in heel which resulted in infection and wound for almost a year.  Will start new wound center treatment next month.  Started weight watchers for diet management as she is unable to exercise adequately with foot wound, down 30 pounds.  Does not monitor blood sugar daily, encouraged to do so, yesterday was 158.  State MD would like to order [facility], but hesitant due to price.        Goals Addressed               This Visit's Progress     Decrease A1C (currently 9.6) (pt-stated)        Care Coordination Interventions: Provided education to patient about basic DM disease process Reviewed medications with patient and discussed importance of medication adherence Discussed plans with patient for ongoing care management follow up and provided patient with direct contact information for care management team Reviewed scheduled/upcoming provider appointments including: follow up with PCP to review new A1C and wound care on 9/6 for right foot ulcer Advised patient, providing education and rationale, to check cbg at least daily and record, calling PCP for findings outside established parameters Assessed social determinant of health barriers Provided information on [employer], including ways to get coupons/savings        SDOH assessments and interventions completed:  Yes  SDOH Interventions Today    Flowsheet Row Most Recent Value  SDOH Interventions   Food Insecurity Interventions Intervention Not Indicated  Housing Interventions Intervention Not Indicated  Transportation Interventions  Intervention Not Indicated        Care Coordination Interventions Activated:  Yes  Care Coordination Interventions:  Yes, provided   Follow up plan: Follow up call scheduled for 10/19    Encounter Outcome:  Pt. Visit Completed   Kemper Durie, RN, MSN, [facility] Care Management Care Management Coordinator [phone]

## 2022-06-03 DIAGNOSIS — Z79899 Other long term (current) drug therapy: Secondary | ICD-10-CM | POA: Diagnosis not present

## 2022-06-03 DIAGNOSIS — E559 Vitamin D deficiency, unspecified: Secondary | ICD-10-CM | POA: Diagnosis not present

## 2022-06-03 DIAGNOSIS — E119 Type 2 diabetes mellitus without complications: Secondary | ICD-10-CM | POA: Diagnosis not present

## 2022-06-03 DIAGNOSIS — Z6841 Body Mass Index (BMI) 40.0 and over, adult: Secondary | ICD-10-CM | POA: Diagnosis not present

## 2022-06-03 DIAGNOSIS — R030 Elevated blood-pressure reading, without diagnosis of hypertension: Secondary | ICD-10-CM | POA: Diagnosis not present

## 2022-06-03 DIAGNOSIS — G894 Chronic pain syndrome: Secondary | ICD-10-CM | POA: Diagnosis not present

## 2022-06-03 DIAGNOSIS — L97519 Non-pressure chronic ulcer of other part of right foot with unspecified severity: Secondary | ICD-10-CM | POA: Diagnosis not present

## 2022-06-05 DIAGNOSIS — Z79899 Other long term (current) drug therapy: Secondary | ICD-10-CM | POA: Diagnosis not present

## 2022-06-12 ENCOUNTER — Other Ambulatory Visit (HOSPITAL_COMMUNITY): Payer: Self-pay | Admitting: Physician Assistant

## 2022-06-12 ENCOUNTER — Other Ambulatory Visit: Payer: Self-pay | Admitting: Physician Assistant

## 2022-06-12 ENCOUNTER — Encounter (HOSPITAL_BASED_OUTPATIENT_CLINIC_OR_DEPARTMENT_OTHER): Payer: BC Managed Care – PPO | Attending: Physician Assistant | Admitting: Physician Assistant

## 2022-06-12 DIAGNOSIS — Z89421 Acquired absence of other right toe(s): Secondary | ICD-10-CM | POA: Diagnosis not present

## 2022-06-12 DIAGNOSIS — I890 Lymphedema, not elsewhere classified: Secondary | ICD-10-CM | POA: Diagnosis not present

## 2022-06-12 DIAGNOSIS — E11621 Type 2 diabetes mellitus with foot ulcer: Secondary | ICD-10-CM | POA: Insufficient documentation

## 2022-06-12 DIAGNOSIS — L97412 Non-pressure chronic ulcer of right heel and midfoot with fat layer exposed: Secondary | ICD-10-CM | POA: Diagnosis not present

## 2022-06-12 DIAGNOSIS — E669 Obesity, unspecified: Secondary | ICD-10-CM | POA: Insufficient documentation

## 2022-06-12 DIAGNOSIS — Z6841 Body Mass Index (BMI) 40.0 and over, adult: Secondary | ICD-10-CM | POA: Insufficient documentation

## 2022-06-12 DIAGNOSIS — M79671 Pain in right foot: Secondary | ICD-10-CM

## 2022-06-14 ENCOUNTER — Encounter (HOSPITAL_BASED_OUTPATIENT_CLINIC_OR_DEPARTMENT_OTHER): Payer: BC Managed Care – PPO | Admitting: Internal Medicine

## 2022-06-14 DIAGNOSIS — E669 Obesity, unspecified: Secondary | ICD-10-CM | POA: Diagnosis not present

## 2022-06-14 DIAGNOSIS — Z6841 Body Mass Index (BMI) 40.0 and over, adult: Secondary | ICD-10-CM | POA: Diagnosis not present

## 2022-06-14 DIAGNOSIS — L97412 Non-pressure chronic ulcer of right heel and midfoot with fat layer exposed: Secondary | ICD-10-CM | POA: Diagnosis not present

## 2022-06-14 DIAGNOSIS — I890 Lymphedema, not elsewhere classified: Secondary | ICD-10-CM | POA: Diagnosis not present

## 2022-06-14 DIAGNOSIS — Z89421 Acquired absence of other right toe(s): Secondary | ICD-10-CM | POA: Diagnosis not present

## 2022-06-14 DIAGNOSIS — E11621 Type 2 diabetes mellitus with foot ulcer: Secondary | ICD-10-CM | POA: Diagnosis not present

## 2022-06-14 NOTE — Progress Notes (Signed)
[name], [name] ([id number]) Visit Report for 06/14/2022 SuperBill Details Patient Name: Date of Service: [name], [name] 06/14/2022 Medical Record Number: [id number] Patient Account Number: [id number] Date of Birth/Sex: Treating RN: [birth year] (56 y.o. F) Primary Care Provider: [name] Other Clinician: Referring Provider: Treating Provider/Extender: Harold Barban in Treatment: 0 Diagnosis Coding ICD-10 Codes Code Description [phone] Type 2 diabetes mellitus with foot ulcer L97.412 Non-pressure chronic ulcer of right heel and midfoot with fat layer exposed I89.0 Lymphedema, not elsewhere classified Facility Procedures CPT4 Code Description Modifier Quantity [id number] (Facility Use Only) [phone] - APPLY MULTLAY COMPRS LWR RT LEG 1 Electronic Signature(s) Signed: 06/14/2022 12:32:37 PM By: Thayer Dallas Signed: 06/14/2022 3:02:11 PM By: Geralyn Corwin DO Entered By: Thayer Dallas on 06/14/2022 10:07:31

## 2022-06-14 NOTE — Progress Notes (Signed)
ARVIS, MIGUEZ ([id number]) Visit Report for 06/14/2022 Arrival Information Details Patient Name: Date of Service: [name], [name] 06/14/2022 8:30 A M Medical Record Number: [id number] Patient Account Number: [id number] Date of Birth/Sex: Treating RN: Aug 20, 1966 (56 y.o. F) Primary Care Dinnis Rog: [name] Other Clinician: Referring Jacara Benito: Treating Edwardo Wojnarowski/Extender: Harold Barban in Treatment: 0 Visit Information History Since Last Visit Added or deleted any medications: No Patient Arrived: Ambulatory Any new allergies or adverse reactions: No Arrival Time: 08:34 Had a fall or experienced change in No Accompanied By: son activities of daily living that may affect Transfer Assistance: None risk of falls: Patient Identification Verified: Yes Signs or symptoms of abuse/neglect since last visito No Secondary Verification Process Completed: Yes Hospitalized since last visit: No Patient Requires Transmission-Based Precautions: No Implantable device outside of the clinic excluding No Patient Has Alerts: No cellular tissue based products placed in the center since last visit: Has Compression in Place as Prescribed: Yes Pain Present Now: Yes Electronic Signature(s) Signed: 06/14/2022 12:32:37 PM By: Thayer Dallas Entered By: Thayer Dallas on 06/14/2022 08:37:36 -------------------------------------------------------------------------------- Encounter Discharge Information Details Patient Name: Date of Service: [name], [name] 06/14/2022 8:30 A M Medical Record Number: [id number] Patient Account Number: [id number] Date of Birth/Sex: Treating RN: Dec 15, 1965 (56 y.o. F) Primary Care Llana Deshazo: [name] Other Clinician: Referring Zebastian Carico: Treating Mykia Holton/Extender: Harold Barban in Treatment: 0 Encounter Discharge Information Items Discharge Condition: Stable Ambulatory Status: Ambulatory Discharge Destination: Home Transportation:  Private Auto Accompanied By: son Schedule Follow-up Appointment: Yes Clinical Summary of Care: Electronic Signature(s) Signed: 06/14/2022 12:32:37 PM By: Thayer Dallas Entered By: Thayer Dallas on 06/14/2022 09:56:46 -------------------------------------------------------------------------------- Patient/Caregiver Education Details Patient Name: Date of Service: [name] 9/8/2023andnbsp8:30 A M Medical Record Number: [id number] Patient Account Number: [id number] Date of Birth/Gender: Treating RN: Apr 20, 1966 (56 y.o. F) Primary Care Physician: [name] Other Clinician: Referring Physician: Treating Physician/Extender: Harold Barban in Treatment: 0 Education Assessment Education Provided To: Patient Education Topics Provided Electronic Signature(s) Signed: 06/14/2022 12:32:37 PM By: Thayer Dallas Entered By: Thayer Dallas on 06/14/2022 09:55:32 -------------------------------------------------------------------------------- Wound Assessment Details Patient Name: Date of Service: [name], [name] 06/14/2022 8:30 A M Medical Record Number: [id number] Patient Account Number: [id number] Date of Birth/Sex: Treating RN: 12-Jun-1966 (56 y.o. F) Primary Care Liev Brockbank: [name] Other Clinician: Referring Shraga Custard: Treating Raini Tiley/Extender: Harold Barban in Treatment: 0 Wound Status Wound Number: 1 Primary Etiology: Diabetic Wound/Ulcer of the Lower Extremity Wound Location: Right Calcaneus Wound Status: Open Wounding Event: Gradually Appeared Date Acquired: 11/07/2020 Weeks Of Treatment: 0 Clustered Wound: No Wound Measurements Length: (cm) 4 Width: (cm) 3.7 Depth: (cm) 0.2 Area: (cm) 11.624 Volume: (cm) 2.325 % Reduction in Area: 0% % Reduction in Volume: 0% Wound Description Classification: Grade 2 Exudate Amount: Medium Exudate Type: Serosanguineous Exudate Color: red, brown Treatment Notes Wound #1  (Calcaneus) Wound Laterality: Right Cleanser Soap and Water Discharge Instruction: May shower and wash wound with dial antibacterial soap and water prior to dressing change. Wound Cleanser Discharge Instruction: Cleanse the wound with wound cleanser prior to applying a clean dressing using gauze sponges, not tissue or cotton balls. Peri-Wound Care Zinc Oxide Ointment 30g tube Discharge Instruction: Apply Zinc Oxide to periwound with each dressing change Topical Primary Dressing Hydrofera Blue Ready Foam, 4x5 in Discharge Instruction: Apply to wound bed as instructed Secondary Dressing ABD Pad, 5x9 Discharge Instruction: Apply over primary dressing as directed. Optifoam Non-Adhesive Dressing, 4x4 in Discharge Instruction: Make a foam donut and Apply over primary dressing  as directed. Woven Gauze Sponge, Non-Sterile 4x4 in Discharge Instruction: Apply over primary dressing as directed. Secured With Compression Wrap CoFlex TLC XL 2-layer Compression System 4x7 (in/yd) Discharge Instruction: Apply CoFlex 2-layer compression as directed. (alt for 4 layer) Compression Stockings Add-Ons Electronic Signature(s) Signed: 06/14/2022 12:32:37 PM By: Thayer Dallas Entered By: Thayer Dallas on 06/14/2022 08:37:57 -------------------------------------------------------------------------------- Vitals Details Patient Name: Date of Service: [name], [name] 06/14/2022 8:30 A M Medical Record Number: [id number] Patient Account Number: [id number] Date of Birth/Sex: Treating RN: 09-Jun-1966 (56 y.o. F) Primary Care Nori Winegar: [name] Other Clinician: Referring Laetitia Schnepf: Treating Edwena Mayorga/Extender: Harold Barban in Treatment: 0 Vital Signs Time Taken: 08:40 Temperature (F): 98.4 Height (in): 65 Pulse (bpm): 97 Weight (lbs): 331 Respiratory Rate (breaths/min): 18 Body Mass Index (BMI): 55.1 Blood Pressure (mmHg): 115/76 Capillary Blood Glucose (mg/dl):  841 Reference Range: 80 - 120 mg / dl Electronic Signature(s) Signed: 06/14/2022 12:32:37 PM By: Thayer Dallas Entered By: Thayer Dallas on 06/14/2022 08:41:41

## 2022-06-17 ENCOUNTER — Encounter: Payer: Self-pay | Admitting: *Deleted

## 2022-06-19 ENCOUNTER — Encounter (HOSPITAL_BASED_OUTPATIENT_CLINIC_OR_DEPARTMENT_OTHER): Payer: BC Managed Care – PPO | Admitting: Physician Assistant

## 2022-06-19 DIAGNOSIS — L97412 Non-pressure chronic ulcer of right heel and midfoot with fat layer exposed: Secondary | ICD-10-CM | POA: Diagnosis not present

## 2022-06-19 DIAGNOSIS — Z6841 Body Mass Index (BMI) 40.0 and over, adult: Secondary | ICD-10-CM | POA: Diagnosis not present

## 2022-06-19 DIAGNOSIS — E11621 Type 2 diabetes mellitus with foot ulcer: Secondary | ICD-10-CM | POA: Diagnosis not present

## 2022-06-19 DIAGNOSIS — E669 Obesity, unspecified: Secondary | ICD-10-CM | POA: Diagnosis not present

## 2022-06-19 DIAGNOSIS — I890 Lymphedema, not elsewhere classified: Secondary | ICD-10-CM | POA: Diagnosis not present

## 2022-06-19 DIAGNOSIS — Z89421 Acquired absence of other right toe(s): Secondary | ICD-10-CM | POA: Diagnosis not present

## 2022-06-19 NOTE — Progress Notes (Addendum)
KARIM, HAUFLER ([id number]) Visit Report for 06/19/2022 Chief Complaint Document Details Patient Name: Date of Service: [name], [name] 06/19/2022 10:00 A M Medical Record Number: [id number] Patient Account Number: [id number] Date of Birth/Sex: Treating RN: Oct 27, 1965 (56 y.o. F) Primary Care Provider: Stoney Bang Other Clinician: Referring Provider: Treating Provider/Extender: Dallas Breeding in Treatment: 1 Information Obtained from: Patient Chief Complaint Right heel ulcer Electronic Signature(s) Signed: 06/19/2022 10:18:23 AM By: Worthy Keeler PA-C Entered By: Worthy Keeler on 06/19/2022 10:18:22 -------------------------------------------------------------------------------- HPI Details Patient Name: Date of Service: [name], [name] 06/19/2022 10:00 A M Medical Record Number: [id number] Patient Account Number: [id number] Date of Birth/Sex: Treating RN: 02/15/1966 (56 y.o. F) Primary Care Provider: Stoney Bang Other Clinician: Referring Provider: Treating Provider/Extender: Dallas Breeding in Treatment: 1 History of Present Illness HPI Description: 06-13-2022 upon evaluation today patient presents for evaluation of her right heel ulcer. She is having a tremendous amount of pain at this location. She tells me that her most recent hemoglobin A1c was 8.7 and that was on 08-23-2021. This ulcer on the heel has been present she states since February 2023 when she had an injection to the heel by podiatry. She states that she began to have increasing pain the wound opened and has been open ever since. 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 She had an x-ray in June if anything can although there did not appear to be any obvious evidence of osteomyelitis at that point. She subsequently has had OR debridement several times of the wound performed by podiatry. Patient does have a history of lymphedema of the lower extremities she  is also a type II diabetic. 06-19-2022 upon evaluation today patient appears to be doing better in regard to the size of her leg which is significantly improved. With that being said she had a lot of drainage from the heel which is completely understandable considering what is going on at this time. There does not appear to be any evidence of active infection there is no warmth or irritation to the leg in general. With that being said the patient does have an issue here with the amount of drainage that is going on I definitely think Zetuvit is good to be the better way to go in place of ABD pads. Also think that she could potentially benefit from 3 times per week dressing changes but again right now we are going to stick with the to change it today, change in her Friday, and then subsequently seeing where things stand next Wednesday. The other option would be to change her to a different day for wound care having her come then say like a Tuesday Friday or Monday Thursday. Electronic Signature(s) Signed: 06/19/2022 10:32:44 AM By: Worthy Keeler PA-C Entered By: Worthy Keeler on 06/19/2022 10:32:44 -------------------------------------------------------------------------------- Physical Exam Details Patient Name: Date of Service: [name], [name] 06/19/2022 10:00 A M Medical Record Number: [id number] Patient Account Number: [id number] Date of Birth/Sex: Treating RN: July 23, 1966 (56 y.o. F) Primary Care Provider: Stoney Bang Other Clinician: Referring Provider: Treating Provider/Extender: Matt Holmes Weeks in Treatment: 1 Constitutional Obese and well-hydrated in no acute distress. Respiratory normal breathing without difficulty. Psychiatric this patient is able to make decisions and demonstrates good insight into disease process. Alert and Oriented x 3. pleasant and cooperative. Notes Upon inspection patient's wound bed actually showed signs of significant improvement there  is a lot of drainage noted and maceration but again  I am hopeful this will be doing better over the next week now that we have a lot of the swelling down. Electronic Signature(s) Signed: 06/19/2022 10:33:08 AM By: Worthy Keeler PA-C Entered By: Worthy Keeler on 06/19/2022 10:33:08 -------------------------------------------------------------------------------- Physician Orders Details Patient Name: Date of Service: CHANTA, SCALERA 06/19/2022 10:00 A M Medical Record Number: [id number] Patient Account Number: [id number] Date of Birth/Sex: Treating RN: 02-18-1966 (56 y.o. 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 - Wednesday w/ Jeri Cos and Allayne Butcher Rm # 9 Return A Nurse Visit: - This Friday 06/21/22 w/ Tammi Klippel Rm # 8 @ 0815 Anesthetic (In clinic) Topical Lidocaine 5% applied to wound bed Bathing/ Shower/ Hygiene May shower with protection but do not get wound dressing(s) wet. - May use a cast protector. YOu can find those at [facility] or [facility] Edema Control - Lymphedema / SCD / Other Elevate legs to the level of the heart or above for 30 minutes daily and/or when sitting, a frequency of: Avoid standing for long periods of time. Off-Loading Heel suspension boot to: Wound Treatment Wound #1 - Calcaneus Wound Laterality: Right Cleanser: Soap and Water 1 x Per Week/7 Days Discharge Instructions: May shower and wash wound with dial antibacterial soap and water prior to dressing change. Cleanser: Wound Cleanser 1 x Per Week/7 Days Discharge Instructions: Cleanse the  wound with wound cleanser prior to applying a clean dressing using gauze sponges, not tissue or cotton balls. Peri-Wound Care: Triamcinolone 15 (g) 1 x Per Week/7 Days Discharge Instructions: Use triamcinolone 15 (g) as directed Peri-Wound Care: Zinc Oxide Ointment 30g tube 1 x Per Week/7 Days Discharge Instructions: Apply Zinc Oxide to periwound with each dressing change Peri-Wound Care: Sween Lotion (Moisturizing lotion) 1 x Per Week/7 Days Discharge Instructions: Apply moisturizing lotion as directed Prim Dressing: Hydrofera Blue Ready Foam, 4x5 in 1 x Per Week/7 Days ary Discharge Instructions: Apply to wound bed as instructed Secondary Dressing: Optifoam Non-Adhesive Dressing, 4x4 in 1 x Per Week/7 Days Discharge Instructions: Make a foam donut and Apply over primary dressing as directed. Secondary Dressing: Woven Gauze Sponge, Non-Sterile 4x4 in 1 x Per Week/7 Days Discharge Instructions: Apply over primary dressing as directed. Secondary Dressing: Zetuvit Plus 4x8 in 1 x Per Week/7 Days Discharge Instructions: Apply over primary dressing as directed. Compression Wrap: CoFlex TLC XL 2-layer Compression System 4x7 (in/yd) 1 x Per Week/7 Days Discharge Instructions: Apply CoFlex 2-layer compression as directed. (alt for 4 layer) Electronic Signature(s) Signed: 06/19/2022 5:46:43 PM By: Worthy Keeler PA-C Signed: 06/20/2022 4:08:15 PM By: Rhae Hammock RN Entered By: Rhae Hammock on 06/19/2022 10:28:28 -------------------------------------------------------------------------------- Problem List Details Patient Name: Date of Service: [name], [name] 06/19/2022 10:00 A M Medical Record Number: [id number] Patient Account Number: [id number] Date of Birth/Sex: Treating RN: Dec 13, 1965 (56 y.o. 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 F) Primary Care Provider: [name] Other Clinician: Referring Provider: Treating Provider/Extender: [name] in Treatment: 1 Subjective Chief Complaint Information obtained from Patient Right heel ulcer History of Present Illness (HPI) 06-13-2022 upon evaluation today patient presents for evaluation of her right heel ulcer. She is having a tremendous amount of pain at this location. She tells me that her most recent hemoglobin A1c was 8.7 and that was on 08-23-2021. This ulcer on the heel has been present she states since February 2023 when she had an injection to the heel by podiatry. She states that she began to have increasing pain the wound opened and has been open ever since. 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 She had an x-ray in June if anything can although there did not appear to be any obvious evidence of osteomyelitis at that point. She subsequently has had OR debridement several times of the wound performed by podiatry. Patient does have a history of lymphedema of the lower extremities she is also a type II diabetic. 06-19-2022 upon evaluation today patient appears to be doing  better in regard to the size of her leg which is significantly improved. With that being said she had a lot of drainage from the heel which is completely understandable considering what is going on at this time. There does not appear to be any evidence of active infection there is no warmth or irritation to the leg in general. With that being said the patient does have an issue here with the amount of drainage that is going on I definitely think Zetuvit is good to be the better way to go in place of ABD pads. Also think that she could potentially benefit from 3 times per week dressing changes but again right now we are going to stick with the to change it today, change in her Friday, and then subsequently seeing where things stand next Wednesday. The other option would be to change her to a different day for wound care having her come then say like a Tuesday Friday or Monday Thursday. Objective Constitutional Obese and well-hydrated in no acute distress. Vitals Time Taken: 10:06 AM, Height: 65 in, Weight: 331 lbs, BMI: 55.1, Temperature: 98.6 F, Pulse: 114 bpm, Respiratory Rate: 17 breaths/min, Blood Pressure: 117/81 mmHg, Capillary Blood Glucose: 122 mg/dl. Respiratory normal breathing without difficulty. Psychiatric this patient is able to make decisions and demonstrates good insight into disease process. Alert and Oriented x 3. pleasant and cooperative. General Notes: Upon inspection patient's wound bed actually showed signs of significant improvement there is a lot of drainage noted and maceration but again I am hopeful this will be doing better over the next week now that we have a lot of the swelling down. Integumentary (Hair, Skin) Wound #1 status is Open. Original cause of wound was Gradually Appeared. The date acquired was: 11/07/2020. The wound has been in treatment 1 weeks. The wound is located on the Right Calcaneus. The wound measures 4.3cm length x 3.4cm width x 0.2cm depth;  11.483cm^2 area and 2.297cm^3 volume. There is Fat Layer (Subcutaneous Tissue) exposed. There is no tunneling or undermining  noted. There is a medium amount of serosanguineous drainage noted. The wound margin is distinct with the outline attached to the wound base. There is large (67-100%) red, pink granulation within the wound bed. There is a small (1- 33%) amount of necrotic tissue within the wound bed including Adherent Slough. Assessment Active Problems ICD-10 Type 2 diabetes mellitus with foot ulcer Non-pressure chronic ulcer of right heel and midfoot with fat layer exposed Lymphedema, not elsewhere classified Procedures Wound #1 Pre-procedure diagnosis of Wound #1 is a Diabetic Wound/Ulcer of the Lower Extremity located on the Right Calcaneus . There was a Four Layer Compression Therapy Procedure by Rhae Hammock, RN. Post procedure Diagnosis Wound #1: Same as Pre-Procedure Plan Follow-up Appointments: Return Appointment in 1 week. - Wednesday w/ Jeri Cos and Allayne Butcher Rm # 9 Nurse Visit: - This Friday 06/21/22 w/ Tammi Klippel Rm # 8 @ 0815 Anesthetic: (In clinic) Topical Lidocaine 5% applied to wound bed Bathing/ Shower/ Hygiene: May shower with protection but do not get wound dressing(s) wet. - May use a cast protector. YOu can find those at [facility] or [facility] Edema Control - Lymphedema / SCD / Other: Elevate legs to the level of the heart or above for 30 minutes daily and/or when sitting, a frequency of: Avoid standing for long periods of time. Off-Loading: Heel suspension boot to: WOUND #1: - Calcaneus Wound Laterality: Right Cleanser: Soap and Water 1 x Per Week/7 Days Discharge Instructions: May shower and wash wound with dial antibacterial soap and water prior to dressing change. Cleanser: Wound Cleanser 1 x Per Week/7 Days Discharge Instructions: Cleanse the wound with wound cleanser prior to applying a clean dressing using gauze sponges, not tissue or cotton  balls. Peri-Wound Care: Triamcinolone 15 (g) 1 x Per Week/7 Days Discharge Instructions: Use triamcinolone 15 (g) as directed Peri-Wound Care: Zinc Oxide Ointment 30g tube 1 x Per Week/7 Days Discharge Instructions: Apply Zinc Oxide to periwound with each dressing change Peri-Wound Care: Sween Lotion (Moisturizing lotion) 1 x Per Week/7 Days Discharge Instructions: Apply moisturizing lotion as directed Prim Dressing: Hydrofera Blue Ready Foam, 4x5 in 1 x Per Week/7 Days ary Discharge Instructions: Apply to wound bed as instructed Secondary Dressing: Optifoam Non-Adhesive Dressing, 4x4 in 1 x Per Week/7 Days Discharge Instructions: Make a foam donut and Apply over primary dressing as directed. Secondary Dressing: Woven Gauze Sponge, Non-Sterile 4x4 in 1 x Per Week/7 Days Discharge Instructions: Apply over primary dressing as directed. Secondary Dressing: Zetuvit Plus 4x8 in 1 x Per Week/7 Days Discharge Instructions: Apply over primary dressing as directed. Com pression Wrap: CoFlex TLC XL 2-layer Compression System 4x7 (in/yd) 1 x Per Week/7 Days Discharge Instructions: Apply CoFlex 2-layer compression as directed. (alt for 4 layer) 1. I would recommend currently that we go ahead and continue with the wound. Patient is in agreement with plan. This includes the use of the [facility] dressing with a double layer of this and then subsequently we will use the Zetuvit to catch drainage over top. 2. Also would recommend that we have the patient continue with the Coflex, pression wrapping. 3. I would also suggest the patient continue to monitor for any signs of worsening or infection if anything changes she should let me know as soon as possible. Where they will use triamcinolone to the leg but again I do not see any evidence of this being infected. We will see patient back for reevaluation in 1 week here in the clinic. If anything worsens or changes patient will  contact our office for  additional recommendations. Electronic Signature(s) Signed: 06/19/2022 10:34:22 AM By: Lenda Kelp PA-C Entered By: Lenda Kelp on 06/19/2022 10:34:22 -------------------------------------------------------------------------------- SuperBill Details Patient Name: Date of Service: [name], [name] 06/19/2022 Medical Record Number: [id number] Patient Account Number: [id number] Date of Birth/Sex: Treating RN: [birth year] (56 y.o. 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 I89.0 Lymphedema, not elsewhere classified Facility Procedures CPT4 Code: [id number] Description: (Facility Use Only) [phone] - APPLY MULTLAY COMPRS LWR RT LEG Modifier: Quantity: 1 Physician Procedures : CPT4 Code Description Modifier [id number] - WC PHYS LEVEL 3 - EST PT ICD-10 Diagnosis Description E11.621 Type 2 diabetes mellitus with foot ulcer L97.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

## 2022-06-20 NOTE — Progress Notes (Signed)
[name], [name] ([id number]) Visit Report for 06/19/2022 Arrival Information Details Patient Name: Date of Service: [name], [name] 06/19/2022 10:00 A M Medical Record Number: [id number] Patient Account Number: [id number] Date of Birth/Sex: Treating RN: Mar 05, 1966 (56 y.o. [name], [name] Primary Care [name]: [name] Other Clinician: Referring [name]: Treating [name]/Extender: [name] in Treatment: 1 Visit Information History Since Last Visit Added or deleted any medications: No Patient Arrived: Ambulatory Any new allergies or adverse reactions: No Arrival Time: 10:04 Had a fall or experienced change in No Accompanied By: son activities of daily living that may affect Transfer Assistance: Manual risk of falls: Patient Identification Verified: Yes Signs or symptoms of abuse/neglect since last visito No Secondary Verification Process Completed: Yes Hospitalized since last visit: No Patient Requires Transmission-Based Precautions: No Implantable device outside of the clinic excluding No Patient Has Alerts: No cellular tissue based products placed in the center since last visit: Has Dressing in Place as Prescribed: Yes Pain Present Now: Yes Electronic Signature(s) Signed: 06/20/2022 4:08:15 PM By: [name] Mu RN Entered By: [name] on 06/19/2022 10:05:16 -------------------------------------------------------------------------------- Compression Therapy Details Patient Name: Date of Service: [name], [name] 06/19/2022 10:00 A M Medical Record Number: [id number] Patient Account Number: [id number] Date of Birth/Sex: Treating RN: 03/20/1966 (56 y.o. [name], [name] Primary Care Sem Mccaughey: [name] Other Clinician: Referring Gedeon Brandow: Treating Shamika Pedregon/Extender: [name] in Treatment: 1 Compression Therapy Performed for Wound Assessment: Wound #1 Right Calcaneus Performed By: Clinician [name] Mu, RN Compression Type: Four Layer Post Procedure Diagnosis Same as Pre-procedure Electronic Signature(s) Signed: 06/20/2022 4:08:15 PM By: [name] Mu RN Entered By: [name] on 06/19/2022 10:28:46 -------------------------------------------------------------------------------- Encounter Discharge Information Details Patient Name: Date of Service: [name], [name] 06/19/2022 10:00 A M Medical Record Number: [id number] Patient Account Number: [id number] Date of Birth/Sex: Treating RN: 08/17/1966 (56 y.o. [name], [name] Primary Care Abella Shugart: [name] Other Clinician: Referring Quasim Doyon: Treating Adrianna Dudas/Extender: [name] in Treatment: 1 Encounter Discharge Information Items Discharge Condition: Stable Ambulatory Status: Ambulatory Discharge Destination: Home Transportation: Private Auto Accompanied By: son Schedule Follow-up Appointment: Yes Clinical Summary of Care: Patient Declined Electronic Signature(s) Signed: 06/20/2022 4:08:15 PM By: [name] Mu RN Entered By: [name] on 06/19/2022 10:43:15 -------------------------------------------------------------------------------- Lower Extremity Assessment Details Patient Name: Date of Service: [name], [name] 06/19/2022 10:00 A M Medical Record Number: [id number] Patient Account Number: [id number] Date of Birth/Sex: Treating RN: [birth year] (56 y.o. [name], [name] Primary Care Verenis Nicosia: [name] Other Clinician: Referring Rahshawn Remo: Treating Arieliz Latino/Extender: [name] Weeks in Treatment: 1 Edema Assessment Assessed: [Left: No] [Right: No] Edema: [Left: Ye] [Right: s] Calf Left: Right: Point of Measurement: 32 cm From Medial Instep 57 cm Ankle Left: Right: Point of Measurement: 10 cm From Medial Instep 31.5 cm Vascular Assessment Pulses: Dorsalis Pedis Palpable: [Right:Yes] Posterior Tibial Palpable:  [Right:Yes] Electronic Signature(s) Signed: 06/20/2022 4:08:15 PM By: [name] Mu RN Entered By: [name] on 06/19/2022 10:11:44 -------------------------------------------------------------------------------- Multi-Disciplinary Care Plan Details Patient Name: Date of Service: [name], [name] 06/19/2022 10:00 A M Medical Record Number: [id number] Patient Account Number: [id number] Date of Birth/Sex: Treating RN: [birth year] (56 y.o. [name], [name] Primary Care Eviana Sibilia: [name] Other Clinician: Referring Lonette Stevison: Treating Ege Muckey/Extender: [name] in Treatment: 1 Active Inactive Orientation to the Wound Care Program Nursing Diagnoses: Knowledge deficit related to the wound healing center program Goals: Patient/caregiver will verbalize understanding of the Wound Healing Center Program Date Initiated: 06/12/2022 Target Resolution Date:  07/13/2022 Goal Status: Active Interventions: Provide education on orientation to the wound center Notes: Wound/Skin Impairment Nursing Diagnoses: Impaired tissue integrity Knowledge deficit related to ulceration/compromised skin integrity Goals: Patient will have a decrease in wound volume by X% from date: (specify in notes) Date Initiated: 06/12/2022 Target Resolution Date: 07/13/2022 Goal Status: Active Patient/caregiver will verbalize understanding of skin care regimen Date Initiated: 06/12/2022 Target Resolution Date: 07/13/2022 Goal Status: Active Ulcer/skin breakdown will have a volume reduction of 30% by week 4 Date Initiated: 06/12/2022 Target Resolution Date: 07/13/2022 Goal Status: Active Ulcer/skin breakdown will have a volume reduction of 50% by week 8 Date Initiated: 06/12/2022 Target Resolution Date: 07/13/2022 Goal Status: Active Interventions: Assess patient/caregiver ability to obtain necessary supplies Assess patient/caregiver ability to perform ulcer/skin care regimen upon  admission and as needed Assess ulceration(s) every visit Notes: Electronic Signature(s) Signed: 06/20/2022 4:08:15 PM By: [name] Mu RN Entered By: [name] on 06/19/2022 10:18:23 -------------------------------------------------------------------------------- Pain Assessment Details Patient Name: Date of Service: [name], [name] 06/19/2022 10:00 A M Medical Record Number: [id number] Patient Account Number: [id number] Date of Birth/Sex: Treating RN: September 24, 1966 (56 y.o. [name], [name] Primary Care Addilee Neu: [name] Other Clinician: Referring Iza Preston: Treating Ladeja Pelham/Extender: [name] in Treatment: 1 Active Problems Location of Pain Severity and Description of Pain Patient Has Paino Yes Site Locations Pain Location: Generalized Pain, Pain in Ulcers With Dressing Change: Yes Duration of the Pain. Constant / Intermittento Constant Rate the pain. Current Pain Level: 5 Worst Pain Level: 10 Least Pain Level: 0 Tolerable Pain Level: 5 Character of Pain Describe the Pain: Aching Pain Management and Medication Current Pain Management: Medication: No Cold Application: No Rest: No Massage: No Activity: No T.E.N.S.: No Heat Application: No Leg drop or elevation: No Is the Current Pain Management Adequate: Adequate How does your wound impact your activities of daily livingo Sleep: No Bathing: No Appetite: No Relationship With Others: No Bladder Continence: No Emotions: No Bowel Continence: No Work: No Toileting: No Drive: No Dressing: No Hobbies: No Electronic Signature(s) Signed: 06/20/2022 4:08:15 PM By: [name] Mu RN Entered By: [name] on 06/19/2022 10:06:50 -------------------------------------------------------------------------------- Patient/Caregiver Education Details Patient Name: Date of Service: [name], [name] 9/13/2023andnbsp10:00 A M Medical Record Number: [id number] Patient Account  Number: [id number] Date of Birth/Gender: Treating RN: [birth year] (56 y.o. [name], [name] Primary Care Physician: [name] Other Clinician: Referring Physician: Treating Physician/Extender: [name] in Treatment: 1 Education Assessment Education Provided To: Patient Education Topics Provided Welcome T [facility]: o Methods: Explain/Verbal Responses: State content correctly Electronic Signature(s) Signed: 06/20/2022 4:08:15 PM By: [name] Mu RN Entered By: [name] on 06/19/2022 10:18:45 -------------------------------------------------------------------------------- Wound Assessment Details Patient Name: Date of Service: [name], [name] 06/19/2022 10:00 A M Medical Record Number: [id number] Patient Account Number: [id number] Date of Birth/Sex: Treating RN: 05/03/1966 (56 y.o. [name], [name] Primary Care Fady Stamps: [name] Other Clinician: Referring Nikalas Bramel: Treating Dallie Patton/Extender: [name] in Treatment: 1 Wound Status Wound Number: 1 Primary Etiology: Diabetic Wound/Ulcer of the Lower Extremity Wound Location: Right Calcaneus Wound Status: Open Wounding Event: Gradually Appeared Comorbid History: Hypertension, Type II Diabetes, Osteomyelitis Date Acquired: 11/07/2020 Weeks Of Treatment: 1 Clustered Wound: No Photos Wound Measurements Length: (cm) 4.3 Width: (cm) 3.4 Depth: (cm) 0.2 Area: (cm) 11.483 Volume: (cm) 2.297 % Reduction in Area: 1.2% % Reduction in Volume: 1.2% Tunneling: No Undermining: No Wound Description Classification: Grade 2 Wound Margin: Distinct, outline attached Exudate Amount: Medium Exudate Type:  Serosanguineous Exudate Color: red, brown Foul Odor After Cleansing: No Slough/Fibrino Yes Wound Bed Granulation Amount: Large (67-100%) Exposed Structure Granulation Quality: Red, Pink Fascia Exposed: No Necrotic Amount: Small (1-33%) Fat  Layer (Subcutaneous Tissue) Exposed: Yes Necrotic Quality: Adherent Slough Tendon Exposed: No Muscle Exposed: No Joint Exposed: No Bone Exposed: No Treatment Notes Wound #1 (Calcaneus) Wound Laterality: Right Cleanser Soap and Water Discharge Instruction: May shower and wash wound with dial antibacterial soap and water prior to dressing change. Wound Cleanser Discharge Instruction: Cleanse the wound with wound cleanser prior to applying a clean dressing using gauze sponges, not tissue or cotton balls. Peri-Wound Care Triamcinolone 15 (g) Discharge Instruction: Use triamcinolone 15 (g) as directed Zinc Oxide Ointment 30g tube Discharge Instruction: Apply Zinc Oxide to periwound with each dressing change Sween Lotion (Moisturizing lotion) Discharge Instruction: Apply moisturizing lotion as directed Topical Primary Dressing Hydrofera Blue Ready Foam, 4x5 in Discharge Instruction: Apply to wound bed as instructed Secondary Dressing Optifoam Non-Adhesive Dressing, 4x4 in Discharge Instruction: Make a foam donut and Apply over primary dressing as directed. Woven Gauze Sponge, Non-Sterile 4x4 in Discharge Instruction: Apply over primary dressing as directed. Zetuvit Plus 4x8 in Discharge Instruction: Apply over primary dressing as directed. Secured With Compression Wrap CoFlex TLC XL 2-layer Compression System 4x7 (in/yd) Discharge Instruction: Apply CoFlex 2-layer compression as directed. (alt for 4 layer) Compression Stockings Add-Ons Electronic Signature(s) Signed: 06/20/2022 4:08:15 PM By: [name] Mu RN Entered By: [name] on 06/19/2022 10:15:47 -------------------------------------------------------------------------------- Vitals Details Patient Name: Date of Service: [name], [name] 06/19/2022 10:00 A M Medical Record Number: [id number] Patient Account Number: [id number] Date of Birth/Sex: Treating RN: [birth year] (56 y.o. [name], [name] Primary Care  Lavon Horn: [name] Other Clinician: Referring Denine Brotz: Treating Avanna Sowder/Extender: [name] in Treatment: 1 Vital Signs Time Taken: 10:06 Temperature (F): 98.6 Height (in): 65 Pulse (bpm): 114 Weight (lbs): 331 Respiratory Rate (breaths/min): 17 Body Mass Index (BMI): 55.1 Blood Pressure (mmHg): 117/81 Capillary Blood Glucose (mg/dl): 409 Reference Range: 80 - 120 mg / dl Electronic Signature(s) Signed: 06/20/2022 4:08:15 PM By: [name] Mu RN Entered By: [name] on 06/19/2022 10:06:31

## 2022-06-21 ENCOUNTER — Encounter (HOSPITAL_BASED_OUTPATIENT_CLINIC_OR_DEPARTMENT_OTHER): Payer: BC Managed Care – PPO | Admitting: Internal Medicine

## 2022-06-21 DIAGNOSIS — I890 Lymphedema, not elsewhere classified: Secondary | ICD-10-CM | POA: Diagnosis not present

## 2022-06-21 DIAGNOSIS — L97412 Non-pressure chronic ulcer of right heel and midfoot with fat layer exposed: Secondary | ICD-10-CM | POA: Diagnosis not present

## 2022-06-21 DIAGNOSIS — Z6841 Body Mass Index (BMI) 40.0 and over, adult: Secondary | ICD-10-CM | POA: Diagnosis not present

## 2022-06-21 DIAGNOSIS — E669 Obesity, unspecified: Secondary | ICD-10-CM | POA: Diagnosis not present

## 2022-06-21 DIAGNOSIS — Z89421 Acquired absence of other right toe(s): Secondary | ICD-10-CM | POA: Diagnosis not present

## 2022-06-21 DIAGNOSIS — E11621 Type 2 diabetes mellitus with foot ulcer: Secondary | ICD-10-CM | POA: Diagnosis not present

## 2022-06-21 NOTE — Progress Notes (Signed)
[name] ([id number]) Visit Report for 06/21/2022 Arrival Information Details Patient Name: Date of Service: [name], [name] 06/21/2022 8:15 A M Medical Record Number: [id number] Patient Account Number: [id number] Date of Birth/Sex: Treating RN: 31-Jan-1966 (56 y.o. [name], [name] Primary Care [name]: [name] Other Clinician: Referring [name]: Treating [name]/Extender: [name] in Treatment: 1 Visit Information History Since Last Visit Added or deleted any medications: No Patient Arrived: Ambulatory Any new allergies or adverse reactions: No Arrival Time: 08:10 Had a fall or experienced change in No Accompanied By: self activities of daily living that may affect Transfer Assistance: None risk of falls: Patient Identification Verified: Yes Signs or symptoms of abuse/neglect since last visito No Secondary Verification Process Completed: Yes Hospitalized since last visit: No Patient Requires Transmission-Based Precautions: No Implantable device outside of the clinic excluding No Patient Has Alerts: No cellular tissue based products placed in the center since last visit: Has Dressing in Place as Prescribed: Yes Has Compression in Place as Prescribed: Yes Pain Present Now: No Electronic Signature(s) Signed: 06/21/2022 5:40:20 PM By: [name] Entered By: [name] on 06/21/2022 08:45:36 -------------------------------------------------------------------------------- Compression Therapy Details Patient Name: Date of Service: [name], [name] 06/21/2022 8:15 A M Medical Record Number: [id number] Patient Account Number: [id number] Date of Birth/Sex: Treating RN: [birth year] (56 y.o. [name] Primary Care Mick Tanguma: [name] Other Clinician: Referring [name]: Treating [name]/Extender: [name] in Treatment: 1 Compression Therapy Performed for Wound Assessment: Wound #1 Right  Calcaneus Performed By: Clinician [name] Stall, RN Compression Type: Double Layer Electronic Signature(s) Signed: 06/21/2022 5:40:20 PM By: [name] Entered By: [name] on 06/21/2022 08:45:54 -------------------------------------------------------------------------------- Encounter Discharge Information Details Patient Name: Date of Service: [name], [name] 06/21/2022 8:15 A M Medical Record Number: [id number] Patient Account Number: [id number] Date of Birth/Sex: Treating RN: 1966-02-13 (56 y.o. [name] Primary Care [name]: [name] Other Clinician: Referring [name]: Treating [name]/Extender: [name] in Treatment: 1 Encounter Discharge Information Items Discharge Condition: Stable Ambulatory Status: Ambulatory Discharge Destination: Home Transportation: Private Auto Accompanied By: family member Schedule Follow-up Appointment: Yes Clinical Summary of Care: Electronic Signature(s) Signed: 06/21/2022 5:40:20 PM By: [name] Entered By: [name] on 06/21/2022 08:46:17 -------------------------------------------------------------------------------- Wound Assessment Details Patient Name: Date of Service: [name], [name] 06/21/2022 8:15 A M Medical Record Number: [id number] Patient Account Number: [id number] Date of Birth/Sex: Treating RN: 30-Dec-1965 (56 y.o. [name], [name] Primary Care [name]: [name] Other Clinician: Referring [name]: Treating [name]/Extender: [name] in Treatment: 1 Wound Status Wound Number: 1 Primary Etiology: Diabetic Wound/Ulcer of the Lower Extremity Wound Location: Right Calcaneus Wound Status: Open Wounding Event: Gradually Appeared Date Acquired: 11/07/2020 Weeks Of Treatment: 1 Clustered Wound: No Wound Measurements Length: (cm) 4.3 Width: (cm) 3.4 Depth: (cm) 0.2 Area: (cm) 11.483 Volume: (cm) 2.297 % Reduction in Area:  1.2% % Reduction in Volume: 1.2% Wound Description Classification: Grade 2 Exudate Amount: Medium Exudate Type: Serosanguineous Exudate Color: red, brown Treatment Notes Wound #1 (Calcaneus) Wound Laterality: Right Cleanser Soap and Water Discharge Instruction: May shower and wash wound with dial antibacterial soap and water prior to dressing change. Wound Cleanser Discharge Instruction: Cleanse the wound with wound cleanser prior to applying a clean dressing using gauze sponges, not tissue or cotton balls. Peri-Wound Care Triamcinolone 15 (g) Discharge Instruction: Use triamcinolone 15 (g) as directed Zinc Oxide Ointment 30g tube Discharge Instruction: Apply Zinc Oxide to periwound with each dressing change Sween  Lotion (Moisturizing lotion) Discharge Instruction: Apply moisturizing lotion as directed Topical Primary Dressing Hydrofera Blue Ready Foam, 4x5 in Discharge Instruction: Apply to wound bed as instructed Secondary Dressing Optifoam Non-Adhesive Dressing, 4x4 in Discharge Instruction: Make a foam donut and Apply over primary dressing as directed. Woven Gauze Sponge, Non-Sterile 4x4 in Discharge Instruction: Apply over primary dressing as directed. Zetuvit Plus 4x8 in Discharge Instruction: Apply over primary dressing as directed. Secured With Compression Wrap CoFlex TLC XL 2-layer Compression System 4x7 (in/yd) Discharge Instruction: Apply CoFlex 2-layer compression as directed. (alt for 4 layer) Compression Stockings Add-Ons Electronic Signature(s) Signed: 06/21/2022 5:40:20 PM By: [name] Entered By: [name] on 06/21/2022 08:45:44

## 2022-06-21 NOTE — Progress Notes (Signed)
ELANNA, BERT ([id number]) Visit Report for 06/21/2022 SuperBill Details Patient Name: Date of Service: [name], [name] 06/21/2022 Medical Record Number: [id number] Patient Account Number: [id number] Date of Birth/Sex: Treating RN: [birth year] (56 y.o. [name] Primary Care Provider: [name] Other Clinician: Referring Provider: Treating Provider/Extender: Delora Fuel in Treatment: 1 Diagnosis Coding ICD-10 Codes Code Description [phone] Type 2 diabetes mellitus with foot ulcer L97.412 Non-pressure chronic ulcer of right heel and midfoot with fat layer exposed I89.0 Lymphedema, not elsewhere classified Facility Procedures CPT4 Code Description Modifier Quantity [id number] (Facility Use Only) [phone] - APPLY MULTLAY COMPRS LWR RT LEG 1 Electronic Signature(s) Signed: 06/21/2022 1:12:16 PM By: Baltazar Najjar MD Signed: 06/21/2022 5:40:20 PM By: Shawn Stall RN, BSN Entered By: Shawn Stall on 06/21/2022 99:24:26

## 2022-06-26 ENCOUNTER — Encounter (HOSPITAL_BASED_OUTPATIENT_CLINIC_OR_DEPARTMENT_OTHER): Payer: BC Managed Care – PPO | Admitting: Physician Assistant

## 2022-06-26 DIAGNOSIS — I890 Lymphedema, not elsewhere classified: Secondary | ICD-10-CM | POA: Diagnosis not present

## 2022-06-26 DIAGNOSIS — Z6841 Body Mass Index (BMI) 40.0 and over, adult: Secondary | ICD-10-CM | POA: Diagnosis not present

## 2022-06-26 DIAGNOSIS — Z89421 Acquired absence of other right toe(s): Secondary | ICD-10-CM | POA: Diagnosis not present

## 2022-06-26 DIAGNOSIS — E669 Obesity, unspecified: Secondary | ICD-10-CM | POA: Diagnosis not present

## 2022-06-26 DIAGNOSIS — E11621 Type 2 diabetes mellitus with foot ulcer: Secondary | ICD-10-CM | POA: Diagnosis not present

## 2022-06-26 DIAGNOSIS — L97412 Non-pressure chronic ulcer of right heel and midfoot with fat layer exposed: Secondary | ICD-10-CM | POA: Diagnosis not present

## 2022-06-26 NOTE — Progress Notes (Addendum)
[name], [name] ([id number]) Visit Report for 06/26/2022 Chief Complaint Document Details Patient Name: Date of Service: [name] [name], [name] [name] [name] 06/26/2022 10:00 A M Medical Record Number: [id number] Patient Account Number: [id number] Date of Birth/Sex: Treating RN: October 31, 1965 (56 y.o. [name] [name] Primary Care Provider: Lia [name] Other Clinician: Referring Provider: Treating Provider/Extender: [name] [name] in Treatment: 2 Information Obtained from: Patient Chief Complaint Right heel ulcer Electronic Signature(s) Signed: 06/26/2022 9:47:10 AM By: [name] Kelp PA-C Entered By: [name] [name] on 06/26/2022 09:47:09 -------------------------------------------------------------------------------- HPI Details Patient Name: Date of Service: [name] [name] [name], [name] [name] [name] 06/26/2022 10:00 A M Medical Record Number: [id number] Patient Account Number: [id number] Date of Birth/Sex: Treating RN: [birth year] (56 y.o. [name] [name] Primary Care Provider: Lia [name] Other Clinician: Referring Provider: Treating Provider/Extender: [name] [name] in Treatment: 2 History of Present Illness HPI Description: 06-13-2022 upon evaluation today patient presents for evaluation of her right heel ulcer. She is having a tremendous amount of pain at this location. She tells me that her most recent hemoglobin A1c was 8.7 and that was on 08-23-2021. This ulcer on the heel has been present she states since February 2023 when she had an injection to the heel by podiatry. She states that she began to have increasing pain the wound opened and has been open ever since. 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 She had an x-ray in June if anything can although there did not appear to be any obvious evidence of osteomyelitis at that point. She subsequently has had OR debridement several times of the wound performed by podiatry. Patient does have a history of  lymphedema of the lower extremities she is also a type II diabetic. 06-19-2022 upon evaluation today patient appears to be doing better in regard to the size of her leg which is significantly improved. With that being said she had a lot of drainage from the heel which is completely understandable considering what is going on at this time. There does not appear to be any evidence of active infection there is no warmth or irritation to the leg in general. With that being said the patient does have an issue here with the amount of drainage that is going on I definitely think Zetuvit is good to be the better way to go in place of ABD pads. Also think that she could potentially benefit from 3 times per week dressing changes but again right now we are going to stick with the to change it today, change in her Friday, and then subsequently seeing where things stand next Wednesday. The other option would be to change her to a different day for wound care having her come then say like a Tuesday Friday or Monday Thursday. 06-26-2022 upon evaluation patient's wound bed actually showed signs of doing well in regard to the overall size it was measuring slightly smaller. With that being said unfortunately the biggest issue we see is she still has a tremendous amount of drainage which is what could keep this from being able to use a total contact cast. For that reason I am did going to discuss with her today the possibility of trying Tubigrip to see if that could be of benefit for her. She voiced understanding and is in agreement with giving this a try. Electronic Signature(s) Signed: 06/26/2022 6:11:46 PM By: [name] Kelp PA-C Entered By: [name] [name] on 06/26/2022 18:11:45 -------------------------------------------------------------------------------- Physical Exam Details Patient Name: Date of Service: [name] [name] [name], [name] [name] 06/26/2022  10:00 A M Medical Record Number: [id number] Patient Account Number:  [id number] Date of Birth/Sex: Treating RN: 1965/10/13 (56 y.o. [name] [name] Primary Care Provider: Stoney [name] Other Clinician: Referring Provider: Treating Provider/Extender: [name] [name] in Treatment: 2 Constitutional Obese and well-hydrated in no acute distress. Respiratory normal breathing without difficulty. Psychiatric this patient is able to make decisions and demonstrates good insight into disease process. Alert and Oriented x 3. pleasant and cooperative. Notes Upon inspection patient's wound bed actually showed signs of good granulation and epithelization at this point. I am actually pleased in a lot of ways with what we see. Unfortunately the biggest issue I see is that she does have a lot of maceration which is hindering her progress from being any faster. Again a total contact cast could be of benefit for her but that is definitely not an option right now. Electronic Signature(s) Signed: 06/26/2022 6:12:17 PM By: [name] Keeler PA-C Entered By: [name] [name] on 06/26/2022 18:12:16 -------------------------------------------------------------------------------- Physician Orders Details Patient Name: Date of Service: [name] [name], [name] [name] 06/26/2022 10:00 A M Medical Record Number: [id number] Patient Account Number: [id number] Date of Birth/Sex: Treating RN: 11-01-1965 (56 y.o. 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 - Wednesday w/ [name] [name] and [name] [name] Rm # 9 Return A Anesthetic (In clinic) Topical Lidocaine 5% applied to  wound bed Bathing/ Shower/ Hygiene May shower with protection but do not get wound dressing(s) wet. - May use a cast protector. YOu can find those at [facility] or [facility] Edema Control - Lymphedema / SCD / Other Elevate legs to the level of the heart or above for 30 minutes daily and/or when sitting, a frequency of: Avoid standing for long periods of time. Off-Loading Heel suspension boot to: Wound Treatment Wound #1 - Calcaneus Wound Laterality: Right Cleanser: Soap and Water 1 x Per CNO/70 Days Discharge Instructions: May shower and wash wound with dial antibacterial soap and water prior to dressing change. Cleanser: Wound Cleanser (DME) (Generic) 1 x Per Day/15 Days Discharge Instructions: Cleanse the wound with wound cleanser prior to applying a clean dressing using gauze sponges, not tissue or cotton balls. Peri-Wound Care: Triamcinolone 15 (g) 1 x Per Day/15 Days Discharge Instructions: Use triamcinolone 15 (g) as directed Peri-Wound Care: Zinc Oxide Ointment 30g tube 1 x Per Day/15 Days Discharge Instructions: Apply Zinc Oxide to periwound with each dressing change Peri-Wound Care: Sween Lotion (Moisturizing lotion) 1 x Per Day/15 Days Discharge Instructions: Apply moisturizing lotion as directed Prim Dressing: Hydrofera Blue Ready Foam, 4x5 in (DME) (Generic) 1 x Per Day/15 Days ary Discharge Instructions: Apply to wound bed as instructed Secondary Dressing: ABD Pad, 5x9 (DME) (Generic) 1 x Per Day/15 Days Discharge Instructions: Apply over primary dressing as directed. Secondary Dressing: Optifoam Non-Adhesive Dressing, 4x4 in (DME) (Generic) 1 x Per Day/15 Days Discharge Instructions: Make a foam donut and Apply over primary dressing as directed. Secondary Dressing: Woven Gauze Sponge, Non-Sterile 4x4 in (DME) (Generic) 1 x Per Day/15 Days Discharge Instructions: Apply over primary dressing as directed. Secured With: [employer], 4.5x3.1 (in/yd) (DME) (Generic) 1 x Per  Day/15 Days Discharge Instructions: Secure with Kerlix  as directed. Secured With: 68M Medipore H Soft Cloth Surgical T ape, 4 x 10 (in/yd) (DME) (Generic) 1 x Per Day/15 Days Discharge Instructions: Secure with tape as directed. Electronic Signature(s) Signed: 06/26/2022 3:46:40 PM By: Fonnie Mu RN Signed: 06/26/2022 5:51:27 PM By: [name] Kelp PA-C Entered By: Fonnie Mu on 06/26/2022 10:19:08 -------------------------------------------------------------------------------- Problem List Details Patient Name: Date of Service: [name] [name], [name] [name] 06/26/2022 10:00 A M Medical Record Number: [id number] Patient Account Number: [id number] Date of Birth/Sex: Treating RN: 03-26-1966 (56 y.o. 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 [name] [name] Primary Care Provider: Lia [name] Other Clinician: Referring Provider: Treating Provider/Extender: [name] [name] in Treatment: 2 Subjective Chief Complaint Information obtained from Patient Right heel ulcer History of Present  Illness (HPI) 06-13-2022 upon evaluation today patient presents for evaluation of her right heel ulcer. She is having a tremendous amount of pain at this location. She tells me that her most recent hemoglobin A1c was 8.7 and that was on 08-23-2021. This ulcer on the heel has been present she states since February 2023 when she had an injection to the heel by podiatry. She states that she began to have increasing pain the wound opened and has been open ever since. 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 She had an x-ray in June if anything can although there did not appear to be any obvious evidence of osteomyelitis at that point. She subsequently has had OR debridement several times of the wound performed by podiatry. Patient does have a history of lymphedema of the lower extremities she is also a type II diabetic. 06-19-2022 upon evaluation today patient appears to be doing better in regard to the size of her leg which is significantly improved. With that being said she had a lot of drainage from the heel which is completely understandable considering what is going on at this time. There does not appear to be any evidence of active infection there is no warmth or irritation to the leg in general. With that being said the patient does have an issue here with the amount of drainage that is going on I definitely think Zetuvit is good to be the better way to go in place of ABD pads. Also think that she could potentially benefit from 3 times per week dressing changes but again right now we are going to stick with the to change it today, change in her Friday, and then subsequently seeing where things stand next Wednesday. The other option would be to change her to a different day for wound care having her come then say like a Tuesday Friday or Monday Thursday. 06-26-2022 upon evaluation patient's wound bed actually showed signs of doing well in regard to the overall size it was  measuring slightly smaller. With that being said  unfortunately the biggest issue we see is she still has a tremendous amount of drainage which is what could keep this from being able to use a total contact cast. For that reason I am did going to discuss with her today the possibility of trying Tubigrip to see if that could be of benefit for her. She voiced understanding and is in agreement with giving this a try. Objective Constitutional Obese and well-hydrated in no acute distress. Vitals Time Taken: 9:43 AM, Height: 65 in, Weight: 331 lbs, BMI: 55.1, Temperature: 101.3 F, Pulse: 129 bpm, Respiratory Rate: 18 breaths/min, Blood Pressure: 109/74 mmHg, Pulse Oximetry: 92 %. Respiratory normal breathing without difficulty. Psychiatric this patient is able to make decisions and demonstrates good insight into disease process. Alert and Oriented x 3. pleasant and cooperative. General Notes: Upon inspection patient's wound bed actually showed signs of good granulation and epithelization at this point. I am actually pleased in a lot of ways with what we see. Unfortunately the biggest issue I see is that she does have a lot of maceration which is hindering her progress from being any faster. Again a total contact cast could be of benefit for her but that is definitely not an option right now. Integumentary (Hair, Skin) Wound #1 status is Open. Original cause of wound was Gradually Appeared. The date acquired was: 11/07/2020. The wound has been in treatment 2 weeks. The wound is located on the Right Calcaneus. The wound measures 3.6cm length x 3.5cm width x 0.3cm depth; 9.896cm^2 area and 2.969cm^3 volume. There is Fat Layer (Subcutaneous Tissue) exposed. There is no tunneling or undermining noted. There is a medium amount of serosanguineous drainage noted. The wound margin is distinct with the outline attached to the wound base. There is large (67-100%) red, pink granulation within the wound bed. There  is a small (1- 33%) amount of necrotic tissue within the wound bed including Adherent Slough. Assessment Active Problems ICD-10 Type 2 diabetes mellitus with foot ulcer Non-pressure chronic ulcer of right heel and midfoot with fat layer exposed Lymphedema, not elsewhere classified Plan Follow-up Appointments: Return Appointment in 1 week. - Wednesday w/ Allen DerryHoyt Stone and Maryruth BunLauran Rm # 9 Anesthetic: (In clinic) Topical Lidocaine 5% applied to wound bed Bathing/ Shower/ Hygiene: May shower with protection but do not get wound dressing(s) wet. - May use a cast protector. [facility] Edema Control - Lymphedema / SCD / Other: Elevate legs to the level of the heart or above for 30 minutes daily and/or when sitting, a frequency of: Avoid standing for long periods of time. Off-Loading: Heel suspension boot to: WOUND #1: - Calcaneus Wound Laterality: Right Cleanser: Soap and Water 1 x Per Day/15 Days Discharge Instructions: May shower and wash wound with dial antibacterial soap and water prior to dressing change. Cleanser: Wound Cleanser (DME) (Generic) 1 x Per Day/15 Days Discharge Instructions: Cleanse the wound with wound cleanser prior to applying a clean dressing using gauze sponges, not tissue or cotton balls. Peri-Wound Care: Triamcinolone 15 (g) 1 x Per Day/15 Days Discharge Instructions: Use triamcinolone 15 (g) as directed Peri-Wound Care: Zinc Oxide Ointment 30g tube 1 x Per Day/15 Days Discharge Instructions: Apply Zinc Oxide to periwound with each dressing change Peri-Wound Care: Sween Lotion (Moisturizing lotion) 1 x Per Day/15 Days Discharge Instructions: Apply moisturizing lotion as directed Prim Dressing: Hydrofera Blue Ready Foam, 4x5 in (DME) (Generic) 1 x Per Day/15 Days ary Discharge Instructions: Apply to wound bed as instructed  Secondary Dressing: ABD Pad, 5x9 (DME) (Generic) 1 x Per Day/15 Days Discharge Instructions: Apply over primary dressing  as directed. Secondary Dressing: Optifoam Non-Adhesive Dressing, 4x4 in (DME) (Generic) 1 x Per Day/15 Days Discharge Instructions: Make a foam donut and Apply over primary dressing as directed. Secondary Dressing: Woven Gauze Sponge, Non-Sterile 4x4 in (DME) (Generic) 1 x Per Day/15 Days Discharge Instructions: Apply over primary dressing as directed. Secured With: [employer], 4.5x3.1 (in/yd) (DME) (Generic) 1 x Per Day/15 Days Discharge Instructions: Secure with Kerlix as directed. Secured With: 53M Medipore H Soft Cloth Surgical T ape, 4 x 10 (in/yd) (DME) (Generic) 1 x Per Day/15 Days Discharge Instructions: Secure with tape as directed. 1. Based on what I am seeing currently we are going to give this a try with Tubigrip and see if we can get her swelling down while still helping to maintain the moisture control a little better. She voiced understanding. 2. We will get an suggest in that respect using the [facility] followed by an ABD pad and roll gauze to secure in place. 3. We will use Tubigrip size E double layer to try to help get some of the swelling down. 4. I am good recommend that she needs to make sure to keep this pulled up well enough in order to allow for appropriate edema control. We will see patient back for reevaluation in 1 week here in the clinic. If anything worsens or changes patient will contact our office for additional recommendations. Electronic Signature(s) Signed: 06/26/2022 6:13:06 PM By: [name] Kelp PA-C Entered By: [name] [name] on 06/26/2022 18:13:05 -------------------------------------------------------------------------------- SuperBill Details Patient Name: Date of Service: [name] [name] [name], [name] [name] [name] 06/26/2022 Medical Record Number: [id number] Patient Account Number: [id number] Date of Birth/Sex: Treating RN: 12-Jan-1966 (56 y.o. [name] [name] Primary Care Provider: Lia [name] Other Clinician: Referring Provider: Treating Provider/Extender:  [name] [name] in Treatment: 2 Diagnosis Coding ICD-10 Codes Code Description [phone] Type 2 diabetes mellitus with foot ulcer L97.412 Non-pressure chronic ulcer of right heel and midfoot with fat layer exposed I89.0 Lymphedema, not elsewhere classified Facility Procedures CPT4 Code: [id number] Description: 99213 - WOUND CARE VISIT-LEV 3 EST PT Modifier: Quantity: 1 Physician Procedures : CPT4 Code Description Modifier [id number] 99213 - WC PHYS LEVEL 3 - EST PT ICD-10 Diagnosis Description E11.621 Type 2 diabetes mellitus with foot ulcer L97.412 Non-pressure chronic ulcer of right heel and midfoot with fat layer exposed I89.0 Lymphedema,  not elsewhere classified Quantity: 1 Electronic Signature(s) Signed: 06/26/2022 6:13:21 PM By: [name] Kelp PA-C Previous Signature: 06/26/2022 3:46:40 PM Version By: Fonnie Mu RN Previous Signature: 06/26/2022 5:51:27 PM Version By: [name] Kelp PA-C Entered By: [name] [name] on 06/26/2022 18:13:20

## 2022-06-26 NOTE — Progress Notes (Signed)
[name], [name] ([id number]) Visit Report for 06/26/2022 Arrival Information Details Patient Name: Date of Service: [name], [name] 06/26/2022 10:00 A M Medical Record Number: [id number] Patient Account Number: [id number] Date of Birth/Sex: Treating RN: 07-12-1966 (56 y.o. [name], [name] Primary Care Shamonica Schadt: Stoney Bang Other Clinician: Referring Kane Kusek: Treating Madissen Wyse/Extender: [place] Breeding in Treatment: 2 Visit Information History Since Last Visit Added or deleted any medications: No Patient Arrived: Ambulatory Any new allergies or adverse reactions: No Arrival Time: 09:38 Had a fall or experienced change in No Accompanied By: son activities of daily living that may affect Transfer Assistance: None risk of falls: Patient Identification Verified: Yes Signs or symptoms of abuse/neglect since last visito No Secondary Verification Process Completed: Yes Hospitalized since last visit: No Patient Requires Transmission-Based Precautions: No Implantable device outside of the clinic excluding No Patient Has Alerts: No cellular tissue based products placed in the center since last visit: Has Dressing in Place as Prescribed: Yes Pain Present Now: Yes Electronic Signature(s) Signed: 06/26/2022 4:44:21 PM By: Erenest Blank Entered By: Erenest Blank on 06/26/2022 09:39:09 -------------------------------------------------------------------------------- Clinic Level of Care Assessment Details Patient Name: Date of Service: [name], [name] 06/26/2022 10:00 A M Medical Record Number: [id number] Patient Account Number: [id number] Date of Birth/Sex: Treating RN: 1965-10-28 (56 y.o. [name], [name] Primary Care [name] Hesler: Stoney Bang Other Clinician: Referring Maleeyah Mccaughey: Treating Shariyah Eland/Extender: [place] Breeding in Treatment: 2 Clinic Level of Care Assessment Items TOOL 4 Quantity Score X- 1 0 Use when only an EandM is performed on  FOLLOW-UP visit ASSESSMENTS - Nursing Assessment / Reassessment X- 1 10 Reassessment of Co-morbidities (includes updates in patient status) X- 1 5 Reassessment of Adherence to Treatment Plan ASSESSMENTS - Wound and Skin A ssessment / Reassessment X - Simple Wound Assessment / Reassessment - one wound 1 5 []  - 0 Complex Wound Assessment / Reassessment - multiple wounds []  - 0 Dermatologic / Skin Assessment (not related to wound area) ASSESSMENTS - Focused Assessment X- 1 5 Circumferential Edema Measurements - multi extremities []  - 0 Nutritional Assessment / Counseling / Intervention []  - 0 Lower Extremity Assessment (monofilament, tuning fork, pulses) []  - 0 Peripheral Arterial Disease Assessment (using hand held doppler) ASSESSMENTS - Ostomy and/or Continence Assessment and Care []  - 0 Incontinence Assessment and Management []  - 0 Ostomy Care Assessment and Management (repouching, etc.) PROCESS - Coordination of Care X - Simple Patient / Family Education for ongoing care 1 15 []  - 0 Complex (extensive) Patient / Family Education for ongoing care X- 1 10 Staff obtains Programmer, systems, Records, T Results / Process Orders est []  - 0 Staff telephones HHA, Nursing Homes / Clarify orders / etc []  - 0 Routine Transfer to another Facility (non-emergent condition) []  - 0 Routine Hospital Admission (non-emergent condition) []  - 0 New Admissions / Biomedical engineer / Ordering NPWT Apligraf, etc. , []  - 0 Emergency Hospital Admission (emergent condition) X- 1 10 Simple Discharge Coordination []  - 0 Complex (extensive) Discharge Coordination PROCESS - Special Needs []  - 0 Pediatric / Minor Patient Management []  - 0 Isolation Patient Management []  - 0 Hearing / Language / Visual special needs []  - 0 Assessment of Community assistance (transportation, D/C planning, etc.) []  - 0 Additional assistance / Altered mentation []  - 0 Support Surface(s) Assessment (bed, cushion,  seat, etc.) INTERVENTIONS - Wound Cleansing / Measurement X - Simple Wound Cleansing - one wound 1 5 []  - 0 Complex Wound Cleansing - multiple wounds X- 1 5  Wound Imaging (photographs - any number of wounds) []  - 0 Wound Tracing (instead of photographs) X- 1 5 Simple Wound Measurement - one wound []  - 0 Complex Wound Measurement - multiple wounds INTERVENTIONS - Wound Dressings X - Small Wound Dressing one or multiple wounds 1 10 []  - 0 Medium Wound Dressing one or multiple wounds []  - 0 Large Wound Dressing one or multiple wounds X- 1 5 Application of Medications - topical []  - 0 Application of Medications - injection INTERVENTIONS - Miscellaneous []  - 0 External ear exam []  - 0 Specimen Collection (cultures, biopsies, blood, body fluids, etc.) []  - 0 Specimen(s) / Culture(s) sent or taken to Lab for analysis []  - 0 Patient Transfer (multiple staff / / Similar devices) []  - 0 Simple Staple / Suture removal (25 or less) []  - 0 Complex Staple / Suture removal (26 or more) []  - 0 Hypo / Hyperglycemic Management (close monitor of Blood Glucose) []  - 0 Ankle / Brachial Index (ABI) - do not check if billed separately X- 1 5 Vital Signs Has the patient been seen at the hospital within the last three years: Yes Total Score: 95 Level Of Care: New/Established - Level 3 Electronic Signature(s) Signed: 06/26/2022 3:46:40 PM By: RN Entered By: on 06/26/2022 10:35:57 -------------------------------------------------------------------------------- Encounter Discharge Information Details Patient Name: Date of Service: [name], [name] 06/26/2022 10:00 A M Medical Record Number: Patient Account Number: Date of Birth/Sex: Treating RN: 01-10-1966 (56 y.o. , [name] Primary Care Phoenix Dresser: Other Clinician: Referring Lillyahna Hemberger: Treating Sigmond Patalano/Extender: in  Treatment: 2 Encounter Discharge Information Items Discharge Condition: Stable Ambulatory Status: Ambulatory Discharge Destination: Home Transportation: Private Auto Accompanied By: son Schedule Follow-up Appointment: Yes Clinical Summary of Care: Patient Declined Electronic Signature(s) Signed: 06/26/2022 3:46:40 PM By: Fonnie Mu RN Entered By: Fonnie Mu on 06/26/2022 10:36:30 -------------------------------------------------------------------------------- Lower Extremity Assessment Details Patient Name: Date of Service: [name], [name] 06/26/2022 10:00 A M Medical Record Number: [id number] Patient Account Number: [id number] Date of Birth/Sex: Treating RN: 12/11/1965 (55 y.o. [name], [name] Primary Care Naftula Donahue: [name] Other Clinician: Referring Tyreanna Bisesi: Treating Avantae Bither/Extender: [name] Weeks in Treatment: 2 Edema Assessment Assessed: [Left: No] [Right: No] Edema: [Left: Ye] [Right: s] Calf Left: Right: Point of Measurement: 32 cm From Medial Instep 57 cm Ankle Left: Right: Point of Measurement: 10 cm From Medial Instep 31.5 cm Vascular Assessment Pulses: Dorsalis Pedis Palpable: [Right:Yes] Posterior Tibial Palpable: [Right:Yes] Electronic Signature(s) Signed: 06/26/2022 3:46:40 PM By: Fonnie Mu RN Signed: 06/26/2022 4:44:21 PM By: 06/28/2022 Entered By: [name] Dash on 06/26/2022 09:53:54 -------------------------------------------------------------------------------- Multi-Disciplinary Care Plan Details Patient Name: Date of Service: [name], [name] 06/26/2022 10:00 A M Medical Record Number: 12/09/1965 Patient Account Number: [id number] Date of Birth/Sex: Treating RN: 1966-06-01 (56 y.o. [name], [name] Primary Care Ernesha Ramone: 06/28/2022 Other Clinician: Referring Kasch Borquez: Treating Arabela Basaldua/Extender: Fonnie Mu in Treatment: 2 Active Inactive Wound/Skin  Impairment Nursing Diagnoses: Impaired tissue integrity Knowledge deficit related to ulceration/compromised skin integrity Goals: Patient will have a decrease in wound volume by X% from date: (specify in notes) Date Initiated: 06/12/2022 Target Resolution Date: 07/13/2022 Goal Status: Active Patient/caregiver will verbalize understanding of skin care regimen Date Initiated: 06/12/2022 Target Resolution Date: 07/13/2022 Goal Status: Active Ulcer/skin breakdown will have a volume reduction of 30% by week 4 Date Initiated: 06/12/2022 Target Resolution Date: 07/13/2022 Goal Status: Active Ulcer/skin breakdown will have a volume  reduction of 50% by week 8 Date Initiated: 06/12/2022 Target Resolution Date: 07/13/2022 Goal Status: Active Interventions: Assess patient/caregiver ability to obtain necessary supplies Assess patient/caregiver ability to perform ulcer/skin care regimen upon admission and as needed Assess ulceration(s) every visit Notes: Electronic Signature(s) Signed: 06/26/2022 3:46:40 PM By: Fonnie Mu RN Entered By: Fonnie Mu on 06/26/2022 10:19:17 -------------------------------------------------------------------------------- Pain Assessment Details Patient Name: Date of Service: [name], [name] 06/26/2022 10:00 A M Medical Record Number: [id number] Patient Account Number: [id number] Date of Birth/Sex: Treating RN: 1966-08-10 (56 y.o. [name], [name] Primary Care Donald Jacque: [name] Other Clinician: Referring Najee Manninen: Treating Kentavius Dettore/Extender: [name] in Treatment: 2 Active Problems Location of Pain Severity and Description of Pain Patient Has Paino Yes Site Locations Pain Location: Pain in Ulcers Rate the pain. Current Pain Level: 4 Pain Management and Medication Current Pain Management: Electronic Signature(s) Signed: 06/26/2022 3:46:40 PM By: Fonnie Mu RN Signed: 06/26/2022 4:44:21 PM By: Thayer Dallas Entered By: Thayer Dallas on 06/26/2022 09:44:19 -------------------------------------------------------------------------------- Patient/Caregiver Education Details Patient Name: Date of Service: [name], [name] 9/20/2023andnbsp10:00 A M Medical Record Number: [id number] Patient Account Number: [id number] Date of Birth/Gender: Treating RN: 07-10-1966 (56 y.o. [name], [name] Primary Care Physician: [name] Other Clinician: Referring Physician: Treating Physician/Extender: [name] in Treatment: 2 Education Assessment Education Provided To: Patient Education Topics Provided Wound/Skin Impairment: Methods: Explain/Verbal Responses: Reinforcements needed, State content correctly Electronic Signature(s) Signed: 06/26/2022 3:46:40 PM By: Fonnie Mu RN Entered By: Fonnie Mu on 06/26/2022 10:19:32 -------------------------------------------------------------------------------- Wound Assessment Details Patient Name: Date of Service: [name], [name] 06/26/2022 10:00 A M Medical Record Number: [id number] Patient Account Number: [id number] Date of Birth/Sex: Treating RN: 02-28-1966 (56 y.o. [name], [name] Primary Care [place]: [name] Other Clinician: Referring Keyasia Jolliff: Treating Markesia Crilly/Extender: [name] in Treatment: 2 Wound Status Wound Number: 1 Primary Etiology: Diabetic Wound/Ulcer of the Lower Extremity Wound Location: Right Calcaneus Wound Status: Open Wounding Event: Gradually Appeared Comorbid History: Hypertension, Type II Diabetes, Osteomyelitis Date Acquired: 11/07/2020 Weeks Of Treatment: 2 Clustered Wound: No Photos Wound Measurements Length: (cm) 3.6 Width: (cm) 3.5 Depth: (cm) 0.3 Area: (cm) 9.896 Volume: (cm) 2.969 % Reduction in Area: 14.9% % Reduction in Volume: -27.7% Epithelialization: None Tunneling: No Undermining: No Wound  Description Classification: Grade 2 Wound Margin: Distinct, outline attached Exudate Amount: Medium Exudate Type: Serosanguineous Exudate Color: red, brown Wound Bed Granulation Amount: Large (67-100%) Exposed Structure Granulation Quality: Red, Pink Fascia Exposed: No Necrotic Amount: Small (1-33%) Fat Layer (Subcutaneous Tissue) Exposed: Yes Necrotic Quality: Adherent Slough Tendon Exposed: No Muscle Exposed: No Joint Exposed: No Bone Exposed: No Treatment Notes Wound #1 (Calcaneus) Wound Laterality: Right Cleanser Soap and Water Discharge Instruction: May shower and wash wound with dial antibacterial soap and water prior to dressing change. Wound Cleanser Discharge Instruction: Cleanse the wound with wound cleanser prior to applying a clean dressing using gauze sponges, not tissue or cotton balls. Peri-Wound Care Triamcinolone 15 (g) Discharge Instruction: Use triamcinolone 15 (g) as directed Zinc Oxide Ointment 30g tube Discharge Instruction: Apply Zinc Oxide to periwound with each dressing change Sween Lotion (Moisturizing lotion) Discharge Instruction: Apply moisturizing lotion as directed Topical Primary Dressing Hydrofera Blue Ready Foam, 4x5 in Discharge Instruction: Apply to wound bed as instructed Secondary Dressing ABD Pad, 5x9 Discharge Instruction: Apply over primary dressing as directed. Optifoam Non-Adhesive Dressing, 4x4 in Discharge Instruction: Make a foam donut and Apply over primary dressing as directed. Woven Gauze Sponge, Non-Sterile 4x4 in  Discharge Instruction: Apply over primary dressing as directed. Secured With [employer] Roll Sterile, 4.5x3.1 (in/yd) Discharge Instruction: Secure with Kerlix as directed. 70M Medipore H Soft Cloth Surgical T ape, 4 x 10 (in/yd) Discharge Instruction: Secure with tape as directed. Compression Wrap Compression Stockings Add-Ons Electronic Signature(s) Signed: 06/26/2022 3:46:40 PM By: Fonnie MuBreedlove, [name]  RN Signed: 06/26/2022 4:44:21 PM By: Thayer Dallasick, Kimberly Entered By: Thayer Dallasick, Kimberly on 06/26/2022 09:56:12 -------------------------------------------------------------------------------- Vitals Details Patient Name: Date of Service: [name], [name] 06/26/2022 10:00 A M Medical Record Number: [id number] Patient Account Number: [id number] Date of Birth/Sex: Treating RN: 01/11/1966 (56 y.o. Ardis RowanF) Breedlove, [name] Primary Care Jahon Bart: Lia HoppingHasanaj, Xaje Other Clinician: Referring Javian Nudd: Treating Journei Thomassen/Extender: Laurann MontanaStone III, Hoyt Hasanaj, Xaje Weeks in Treatment: 2 Vital Signs Time Taken: 09:43 Temperature (F): 101.3 Height (in): 65 Pulse (bpm): 129 Weight (lbs): 331 Respiratory Rate (breaths/min): 18 Body Mass Index (BMI): 55.1 Blood Pressure (mmHg): 109/74 Reference Range: 80 - 120 mg / dl Airway Pulse Oximetry (%): 92 Electronic Signature(s) Signed: 06/26/2022 4:44:21 PM By: Thayer Dallasick, Kimberly Entered By: Thayer Dallasick, Kimberly on 06/26/2022 09:44:08

## 2022-06-28 DIAGNOSIS — E11621 Type 2 diabetes mellitus with foot ulcer: Secondary | ICD-10-CM | POA: Diagnosis not present

## 2022-06-28 DIAGNOSIS — L97412 Non-pressure chronic ulcer of right heel and midfoot with fat layer exposed: Secondary | ICD-10-CM | POA: Diagnosis not present

## 2022-06-28 DIAGNOSIS — I890 Lymphedema, not elsewhere classified: Secondary | ICD-10-CM | POA: Diagnosis not present

## 2022-07-01 ENCOUNTER — Ambulatory Visit (HOSPITAL_COMMUNITY)
Admission: RE | Admit: 2022-07-01 | Discharge: 2022-07-01 | Disposition: A | Discharge status: Home / Self Care | Payer: BC Managed Care – PPO | Source: Ambulatory Visit | Attending: Physician Assistant | Admitting: Physician Assistant

## 2022-07-01 DIAGNOSIS — M79671 Pain in right foot: Secondary | ICD-10-CM | POA: Diagnosis not present

## 2022-07-01 DIAGNOSIS — L97419 Non-pressure chronic ulcer of right heel and midfoot with unspecified severity: Secondary | ICD-10-CM | POA: Diagnosis not present

## 2022-07-01 DIAGNOSIS — M7989 Other specified soft tissue disorders: Secondary | ICD-10-CM | POA: Diagnosis not present

## 2022-07-01 DIAGNOSIS — S91301A Unspecified open wound, right foot, initial encounter: Secondary | ICD-10-CM | POA: Diagnosis not present

## 2022-07-01 MED ORDER — GADOBUTROL 1 MMOL/ML IV SOLN
10.0000 mL | Freq: Once | INTRAVENOUS | Status: AC | PRN
  Administered 2022-07-01: 10 mL via INTRAVENOUS

## 2022-07-03 ENCOUNTER — Encounter (HOSPITAL_BASED_OUTPATIENT_CLINIC_OR_DEPARTMENT_OTHER): Payer: BC Managed Care – PPO | Admitting: Physician Assistant

## 2022-07-03 DIAGNOSIS — E11621 Type 2 diabetes mellitus with foot ulcer: Secondary | ICD-10-CM | POA: Diagnosis not present

## 2022-07-03 DIAGNOSIS — L97412 Non-pressure chronic ulcer of right heel and midfoot with fat layer exposed: Secondary | ICD-10-CM | POA: Diagnosis not present

## 2022-07-03 DIAGNOSIS — Z6841 Body Mass Index (BMI) 40.0 and over, adult: Secondary | ICD-10-CM | POA: Diagnosis not present

## 2022-07-03 DIAGNOSIS — Z89421 Acquired absence of other right toe(s): Secondary | ICD-10-CM | POA: Diagnosis not present

## 2022-07-03 DIAGNOSIS — E669 Obesity, unspecified: Secondary | ICD-10-CM | POA: Diagnosis not present

## 2022-07-03 DIAGNOSIS — I890 Lymphedema, not elsewhere classified: Secondary | ICD-10-CM | POA: Diagnosis not present

## 2022-07-03 NOTE — Progress Notes (Addendum)
[name] ([id number]) Visit Report for 07/03/2022 Chief Complaint Document Details Patient Name: Date of Service: [name], [name] 07/03/2022 10:45 A M Medical Record Number: [id number] Patient Account Number: [id number] Date of Birth/Sex: Treating RN: [birth year] (56 y.o. [name], [name] Primary Care Provider: Stoney Bang Other Clinician: Referring Provider: Treating Provider/Extender: Dallas Breeding in Treatment: 3 Information Obtained from: Patient Chief Complaint Right heel ulcer Electronic Signature(s) Signed: 07/03/2022 10:15:49 AM By: Worthy Keeler PA-C Entered By: Worthy Keeler on 07/03/2022 10:15:49 -------------------------------------------------------------------------------- HPI Details Patient Name: Date of Service: [name] 07/03/2022 10:45 A M Medical Record Number: [id number] Patient Account Number: [id number] Date of Birth/Sex: Treating RN: July 29, 1966 (56 y.o. [name], [name] Primary Care Provider: Stoney Bang Other Clinician: Referring Provider: Treating Provider/Extender: Dallas Breeding in Treatment: 3 History of Present Illness HPI Description: 06-13-2022 upon evaluation today patient presents for evaluation of her right heel ulcer. She is having a tremendous amount of pain at this location. She tells me that her most recent hemoglobin A1c was 8.7 and that was on 08-23-2021. This ulcer on the heel has been present she states since February 2023 when she had an injection to the heel by podiatry. She states that she began to have increasing pain the wound opened and has been open ever since. 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 She had an x-ray in June if anything can although there did not appear to be any obvious evidence of osteomyelitis at that point. She subsequently has had OR debridement several times of the wound performed by podiatry. Patient does have a history of  lymphedema of the lower extremities she is also a type II diabetic. 06-19-2022 upon evaluation today patient appears to be doing better in regard to the size of her leg which is significantly improved. With that being said she had a lot of drainage from the heel which is completely understandable considering what is going on at this time. There does not appear to be any evidence of active infection there is no warmth or irritation to the leg in general. With that being said the patient does have an issue here with the amount of drainage that is going on I definitely think Zetuvit is good to be the better way to go in place of ABD pads. Also think that she could potentially benefit from 3 times per week dressing changes but again right now we are going to stick with the to change it today, change in her Friday, and then subsequently seeing where things stand next Wednesday. The other option would be to change her to a different day for wound care having her come then say like a Tuesday Friday or Monday Thursday. 06-26-2022 upon evaluation patient's wound bed actually showed signs of doing well in regard to the overall size it was measuring slightly smaller. With that being said unfortunately the biggest issue we see is she still has a tremendous amount of drainage which is what could keep this from being able to use a total contact cast. For that reason I am did going to discuss with her today the possibility of trying Tubigrip to see if that could be of benefit for her. She voiced understanding and is in agreement with giving this a try. 07-03-2022 upon evaluation today patient unfortunately appears to be doing significantly worse at this point in regard to her swelling due to the fact that she was unable to really wear  the Tubigrip. She tells me that it was cutting into her. With that being said she also did have an MRI this MRI revealed that she does have osteomyelitis in the heel this was actually just  performed Monday, 25 September. This does show signs of "early osteomyelitis". Nonetheless this is still concerning to me. The wound also appears to be getting deeper in the center aspect of this which has me a little concerned as well. I do believe that she is likely going require an aggressive approach here to try to get things better. I discussed that in greater detail with her today. I will detailed in the plan. Electronic Signature(s) Signed: 07/03/2022 2:00:38 PM By: Worthy Keeler PA-C Entered By: Worthy Keeler on 07/03/2022 14:00:38 -------------------------------------------------------------------------------- Physical Exam Details Patient Name: Date of Service: [name] 07/03/2022 10:45 A M Medical Record Number: [id number] Patient Account Number: [id number] Date of Birth/Sex: Treating RN: 08-28-1966 (56 y.o. Benjaman Lobe Primary Care Provider: Stoney Bang Other Clinician: Referring Provider: Treating Provider/Extender: Dallas Breeding in Treatment: 3 Constitutional Obese and well-hydrated in no acute distress. Respiratory normal breathing without difficulty. Psychiatric this patient is able to make decisions and demonstrates good insight into disease process. Alert and Oriented x 3. pleasant and cooperative. Notes Patient's wound again does appear to be fairly clean but is draining tremendously. There is a lot of maceration noted as well and this is going to be a significant problem for her to be honest. I think that we have to have some way to get the swelling under control which in turn will and get the drainage under control. With that being said this is going to be harder than with the average person to be honest. Electronic Signature(s) Signed: 07/03/2022 2:01:06 PM By: Worthy Keeler PA-C Entered By: Worthy Keeler on 07/03/2022 14:01:05 -------------------------------------------------------------------------------- Physician Orders  Details Patient Name: Date of Service: [name] 07/03/2022 10:45 A M Medical Record Number: [id number] Patient Account Number: [id number] Date of Birth/Sex: Treating RN: [birth year] (56 y.o. 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 - Wednesday w/ Jeri Cos and Allayne Butcher Rm # 9 Return A Anesthetic (In clinic) Topical Lidocaine 5% applied to wound bed Bathing/ Shower/ Hygiene May shower with protection but do not get wound dressing(s) wet. Edema Control - Lymphedema / SCD / Other Elevate legs to the level of the heart or above for 30 minutes daily and/or when sitting, a frequency of: Avoid standing for long periods of time. Off-Loading Open toe surgical shoe to: - w/ peg assist Wound Treatment Wound #1 - Calcaneus Wound Laterality: Right Cleanser: Soap and Water 1 x Per [id number] Days Discharge Instructions: May shower and wash wound with dial antibacterial soap and water prior to dressing change. Cleanser: Wound Cleanser (Generic) 1 x Per Day/15 Days Discharge Instructions: Cleanse the wound with wound cleanser prior to applying a clean dressing using gauze sponges, not tissue or cotton balls. Peri-Wound Care: Triamcinolone 15 (g) 1 x Per Day/15 Days Discharge Instructions: Use triamcinolone 15 (g) as directed Peri-Wound Care: Zinc Oxide Ointment 30g tube 1 x Per Day/15 Days Discharge Instructions: Apply Zinc Oxide to  periwound with each dressing change Peri-Wound Care: Sween Lotion (Moisturizing lotion) 1 x Per Day/15 Days Discharge Instructions: Apply  moisturizing lotion as directed Prim Dressing: Hydrofera Blue Ready Foam, 4x5 in (Generic) 1 x Per Day/15 Days ary Discharge Instructions: Apply to wound bed as instructed Secondary Dressing: ABD Pad, 5x9 (Generic) 1 x Per Day/15 Days Discharge Instructions: Apply over primary dressing as directed. Secondary Dressing: Optifoam Non-Adhesive Dressing, 4x4 in (Generic) 1 x Per Day/15 Days Discharge Instructions: Make a foam donut and Apply over primary dressing as directed. Secondary Dressing: Woven Gauze Sponge, Non-Sterile 4x4 in (Generic) 1 x Per Day/15 Days Discharge Instructions: Apply over primary dressing as directed. Secured With: Elastic Bandage 4 inch (ACE bandage) (DME) (Generic) 1 x Per Day/15 Days Discharge Instructions: Secure with ACE bandage as directed. Secured With: [employer], 4.5x3.1 (in/yd) (Generic) 1 x Per Day/15 Days Discharge Instructions: Secure with Kerlix as directed. Secured With: 21M Medipore H Soft Cloth Surgical T ape, 4 x 10 (in/yd) (Generic) 1 x Per Day/15 Days Discharge Instructions: Secure with tape as directed. Consults Infectious Disease - ***New pt. referral for new early osteomyelitis of right heel*** PCR CULTURE pending and we will fax over once it comes back (should be by this Friday) Patient Medications llergies: No Known Allergies A Notifications Medication Indication Start End 07/03/2022 doxycycline hyclate DOSE 1 - oral 100 mg capsule - 1 capsule oral taken 2 times per day for 14 days. do not take magnesium with this medication Electronic Signature(s) Signed: 07/03/2022 1:00:22 PM By: Worthy Keeler PA-C Entered By: Worthy Keeler on 07/03/2022 13:00:21 Prescription 07/03/2022 -------------------------------------------------------------------------------- Shon Hale PA Patient Name: Provider: 04-Jan-1966 [id number] Date of Birth: NPI#Rickey Primus Sex: DEA #: [phone] Phone #: License #: [employer] Patient Address: [address] [place], [place] [address] [place], [place] [address] [phone] Allergies No Known Allergies Provider's Orders Infectious Disease - ***New pt. referral for new early osteomyelitis of right heel*** PCR CULTURE pending and we will fax over once it comes back (should be by this Friday) Hand Signature: Date(s): Electronic Signature(s) Signed: 07/03/2022 5:27:13 PM By: Worthy Keeler PA-C Entered By: Worthy Keeler on 07/03/2022 13:00:22 -------------------------------------------------------------------------------- Problem List Details Patient Name: Date of Service: KAYELYN, LEMON 07/03/2022 10:45 A M Medical Record Number: [id number] Patient Account Number: [id number] Date of Birth/Sex: Treating RN: 1966/04/13 (56 y.o. 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 [name], [name] Primary Care Provider: Stoney Bang Other Clinician: Referring Provider: Treating Provider/Extender: Dallas Breeding in Treatment: 3 Subjective Chief Complaint Information obtained from Patient Right heel ulcer History of Present Illness (HPI) 06-13-2022 upon evaluation today patient presents for evaluation of her right heel ulcer. She is having a  tremendous amount of pain at this location. She tells me that her most recent hemoglobin A1c was 8.7 and that was on 08-23-2021. This ulcer on the heel has been present she states since February 2023 when she had an injection to the heel by podiatry. She states that she began to have increasing pain the wound opened and has been open ever since. 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 She had an x-ray in June if anything can although there did not appear to be any obvious evidence of osteomyelitis at that point. She subsequently has had OR debridement several times of the wound performed by podiatry. Patient does have a history of lymphedema of the lower extremities she is also a type II diabetic. 06-19-2022 upon evaluation today patient appears to be doing better in regard to the size of her leg which is significantly improved. With that being said she had a lot of drainage from the heel which is completely understandable considering what is going on at this time. There does not appear to be any evidence of active infection there is no warmth or irritation to the leg in general. With that being said the patient does have an issue here with the amount of drainage that is going on I definitely think Zetuvit is good to be the better way to go in place of ABD pads. Also think that she could potentially benefit from 3 times per week dressing changes but again right now we are going to stick with the to change it today, change in her Friday, and then subsequently seeing where things stand next Wednesday. The other option would be to change her to a different day for wound care having her come then say like a Tuesday  Friday or Monday Thursday. 06-26-2022 upon evaluation patient's wound bed actually showed signs of doing well in regard to the overall size it was measuring slightly smaller. With that being said unfortunately the biggest issue we see is she still has a tremendous amount of drainage which is what could keep this from being able to use a total contact cast. For that reason I am did going to discuss with her today the possibility of trying Tubigrip to see if that could be of benefit for her. She voiced understanding and is in agreement with giving this a try. 07-03-2022 upon evaluation today patient unfortunately appears to be doing significantly worse at this point in regard to her swelling due to the fact that she was unable to really wear the Tubigrip. She tells me that it was cutting into her. With that being said she also did have an MRI this MRI revealed that she does have osteomyelitis in the heel this was actually just performed Monday, 25 September. This does show signs of "early osteomyelitis". Nonetheless this is still concerning to me. The wound also appears to be getting deeper in the center aspect of this which has me a little concerned as well. I do believe that she is likely going require an aggressive approach here to try to get things better. I discussed that in greater detail with her today. I will detailed in the plan. Objective Constitutional Obese and well-hydrated in no acute distress. Vitals Time Taken: 11:04 AM, Height: 65 in, Weight: 331 lbs, BMI: 55.1, Temperature: 98.8 F, Pulse: 108 bpm, Respiratory Rate: 18 breaths/min, Blood Pressure: 138/84 mmHg. Respiratory normal breathing without difficulty. Psychiatric this patient is able to make decisions and demonstrates good insight into disease  process. Alert and Oriented x 3. pleasant and cooperative. General Notes: Patient's wound again does appear to be fairly clean but is draining tremendously. There is a lot of  maceration noted as well and this is going to be a significant problem for her to be honest. I think that we have to have some way to get the swelling under control which in turn will and get the drainage under control. With that being said this is going to be harder than with the average person to be honest. Integumentary (Hair, Skin) Wound #1 status is Open. Original cause of wound was Gradually Appeared. The date acquired was: 11/07/2020. The wound has been in treatment 3 weeks. The wound is located on the Right Calcaneus. The wound measures 3.9cm length x 2.8cm width x 0.3cm depth; 8.577cm^2 area and 2.573cm^3 volume. There is Fat Layer (Subcutaneous Tissue) exposed. There is a medium amount of serosanguineous drainage noted. The wound margin is distinct with the outline attached to the wound base. There is large (67-100%) red, pink granulation within the wound bed. There is a small (1-33%) amount of necrotic tissue within the wound bed including Adherent Slough. Assessment Active Problems ICD-10 Type 2 diabetes mellitus with foot ulcer Non-pressure chronic ulcer of right heel and midfoot with fat layer exposed Lymphedema, not elsewhere classified Plan Follow-up Appointments: Return Appointment in 1 week. - Wednesday w/ Jeri Cos and Allayne Butcher Rm # 9 Anesthetic: (In clinic) Topical Lidocaine 5% applied to wound bed Bathing/ Shower/ Hygiene: May shower with protection but do not get wound dressing(s) wet. Edema Control - Lymphedema / SCD / Other: Elevate legs to the level of the heart or above for 30 minutes daily and/or when sitting, a frequency of: Avoid standing for long periods of time. Off-Loading: Open toe surgical shoe to: - w/ peg assist Consults ordered were: Infectious Disease - ***New pt. referral for new early osteomyelitis of right heel*** PCR CULTURE pending and we will fax over once it comes back (should be by this Friday) The following medication(s) was prescribed:  doxycycline hyclate oral 100 mg capsule 1 1 capsule oral taken 2 times per day for 14 days. do not take magnesium with this medication starting 07/03/2022 WOUND #1: - Calcaneus Wound Laterality: Right Cleanser: Soap and Water 1 x Per Day/15 Days Discharge Instructions: May shower and wash wound with dial antibacterial soap and water prior to dressing change. Cleanser: Wound Cleanser (Generic) 1 x Per Day/15 Days Discharge Instructions: Cleanse the wound with wound cleanser prior to applying a clean dressing using gauze sponges, not tissue or cotton balls. Peri-Wound Care: Triamcinolone 15 (g) 1 x Per Day/15 Days Discharge Instructions: Use triamcinolone 15 (g) as directed Peri-Wound Care: Zinc Oxide Ointment 30g tube 1 x Per Day/15 Days Discharge Instructions: Apply Zinc Oxide to periwound with each dressing change Peri-Wound Care: Sween Lotion (Moisturizing lotion) 1 x Per Day/15 Days Discharge Instructions: Apply moisturizing lotion as directed Prim Dressing: Hydrofera Blue Ready Foam, 4x5 in (Generic) 1 x Per Day/15 Days ary Discharge Instructions: Apply to wound bed as instructed Secondary Dressing: ABD Pad, 5x9 (Generic) 1 x Per Day/15 Days Discharge Instructions: Apply over primary dressing as directed. Secondary Dressing: Optifoam Non-Adhesive Dressing, 4x4 in (Generic) 1 x Per Day/15 Days Discharge Instructions: Make a foam donut and Apply over primary dressing as directed. Secondary Dressing: Woven Gauze Sponge, Non-Sterile 4x4 in (Generic) 1 x Per Day/15 Days Discharge Instructions: Apply over primary dressing as directed. Secured With: Elastic Bandage 4 inch (ACE bandage) (  DME) (Generic) 1 x Per Day/15 Days Discharge Instructions: Secure with ACE bandage as directed. Secured With: [employer], 4.5x3.1 (in/yd) (Generic) 1 x Per Day/15 Days Discharge Instructions: Secure with Kerlix as directed. Secured With: 2M Medipore H Soft Cloth Surgical T ape, 4 x 10 (in/yd) (Generic)  1 x Per Day/15 Days Discharge Instructions: Secure with tape as directed. 1. I discussed with the patient today that it is good to be of utmost importance that she be diligent in regard to the care of her wound. This is good to include I do believe IV antibiotics being that she has early osteomyelitis I want to get this under control before anything progresses significantly. Unfortunately there is no way for me to do any type of bone biopsy I would use a PCR culture in order to evaluate for organisms present and I did do this after cleaning the wound from a deep portion of the wound. Again this is not just a superficial wound culture. Subsequently depending on the results of this will make any changes as necessary but right now I am good to go ahead and place her on doxycycline since her leg was also erythematous. Obviously if things are not getting better with the current measures she may need to go to the hospital for further evaluation and treatment. 2. Based on the fact that this wound is on the plantar aspect of her heel region I do believe that she really needs to be staying off of this more. With that being said I do think that a wheelchair to get around could be beneficial for her. With that being said I also think that ideally would be best for her to be out of work. However she tells me that is not possible as she has already used all of her FMLA for the year. This is quite unfortunate I think is can make it very difficult to heal this wound and I do think that this patient is at high risk for having a amputation. Obviously everything we are doing is trying to focus on limb salvage. 3. Based on the progression of where things stand at this time I do believe is well that she really needs to be elevating her leg to try to help with some of the edema control. I think this is going to be of utmost importance and I discussed this with her again today. With that being said I do think that the  elevation coupled with staying off of this would be the ideal way to go but again it does not sound like we can have ideal ways to do that. 4. We are going to attempt using a peg assist offloading shoe this is a perfect but I think is probably the best thing we can do. I am also going to suggest that she try to use the wheelchair that she was mentioning to get around so that she is not having to be up on her feet as much. As much as she can she also needs to try to elevate her legs. 5. With regard to the compression therapy unfortunately she is not going to be able to have compression wraps placed at this point she is draining much to profusely from the wound location to where it is already macerated I am having her switch to daily dressing changes so therefore a stronger 3 a 4-layer compression wrap just is not can be possible. We attempted it and it just became even more macerated. Her  son helps her with dressing changes at home. We will see patient back for reevaluation in 1 week here in the clinic. If anything worsens or changes patient will contact our office for additional recommendations. Electronic Signature(s) Signed: 07/03/2022 2:04:42 PM By: Worthy Keeler PA-C Entered By: Worthy Keeler on 07/03/2022 14:04:42 -------------------------------------------------------------------------------- SuperBill Details Patient Name: Date of Service: DALAYNA, MATHUR 07/03/2022 Medical Record Number: [id number] Patient Account Number: [id number] Date of Birth/Sex: Treating RN: 26-Jul-1966 (56 y.o. [name], [name] Primary Care Provider: Stoney Bang Other Clinician: Referring Provider: Treating Provider/Extender: Dallas Breeding in Treatment: 3 Diagnosis Coding ICD-10 Codes Code Description [phone] Type 2 diabetes mellitus with foot ulcer L97.412 Non-pressure chronic ulcer of right heel and midfoot with fat layer exposed I89.0 Lymphedema, not elsewhere  classified Facility Procedures CPT4 Code: [id number] Description: 99213 - WOUND CARE VISIT-LEV 3 EST PT Modifier: Quantity: 1 Physician Procedures : CPT4 Code Description Modifier [id number] - WC PHYS LEVEL 4 - EST PT ICD-10 Diagnosis Description E11.621 Type 2 diabetes mellitus with foot ulcer L97.412 Non-pressure chronic ulcer of right heel and midfoot with fat layer exposed I89.0 Lymphedema,  not elsewhere classified Quantity: 1 Electronic Signature(s) Signed: 07/03/2022 2:04:51 PM By: Worthy Keeler PA-C Entered By: Worthy Keeler on 07/03/2022 14:04:51

## 2022-07-04 DIAGNOSIS — L97412 Non-pressure chronic ulcer of right heel and midfoot with fat layer exposed: Secondary | ICD-10-CM | POA: Diagnosis not present

## 2022-07-04 NOTE — Progress Notes (Signed)
[name], [name] ([id number]) Visit Report for 07/03/2022 Arrival Information Details Patient Name: Date of Service: [name], [name] 07/03/2022 10:45 A M Medical Record Number: [id number] Patient Account Number: [id number] Date of Birth/Sex: Treating RN: [birth year] (56 y.o. [name], [name] Primary Care Laiya Wisby: [name] Other Clinician: Referring Keiana Tavella: Treating Christo Hain/Extender: [name] in Treatment: 3 Visit Information History Since Last Visit Added or deleted any medications: No Patient Arrived: Ambulatory Any new allergies or adverse reactions: No Arrival Time: 11:03 Had a fall or experienced change in No Accompanied By: self activities of daily living that may affect Transfer Assistance: None risk of falls: Patient Identification Verified: Yes Signs or symptoms of abuse/neglect since last visito No Patient Requires Transmission-Based Precautions: No Hospitalized since last visit: No Patient Has Alerts: No Implantable device outside of the clinic excluding No cellular tissue based products placed in the center since last visit: Has Dressing in Place as Prescribed: Yes Pain Present Now: Yes Electronic Signature(s) Signed: 07/03/2022 4:38:36 PM By: Thayer Dallas Entered By: Thayer Dallas on 07/03/2022 11:04:07 -------------------------------------------------------------------------------- Clinic Level of Care Assessment Details Patient Name: Date of Service: IMAAN, PADGETT 07/03/2022 10:45 A M Medical Record Number: [id number] Patient Account Number: [id number] Date of Birth/Sex: Treating RN: 04-02-1966 (56 y.o. [name], [name] Primary Care Quincy Prisco: [name] Other Clinician: Referring Fadi Menter: Treating Sevannah Madia/Extender: [name] in Treatment: 3 Clinic Level of Care Assessment Items TOOL 4 Quantity Score X- 1 0 Use when only an EandM is performed on FOLLOW-UP visit ASSESSMENTS - Nursing Assessment /  Reassessment X- 1 10 Reassessment of Co-morbidities (includes updates in patient status) X- 1 5 Reassessment of Adherence to Treatment Plan ASSESSMENTS - Wound and Skin A ssessment / Reassessment X - Simple Wound Assessment / Reassessment - one wound 1 5 []  - 0 Complex Wound Assessment / Reassessment - multiple wounds []  - 0 Dermatologic / Skin Assessment (not related to wound area) ASSESSMENTS - Focused Assessment X- 1 5 Circumferential Edema Measurements - multi extremities []  - 0 Nutritional Assessment / Counseling / Intervention []  - 0 Lower Extremity Assessment (monofilament, tuning fork, pulses) []  - 0 Peripheral Arterial Disease Assessment (using hand held doppler) ASSESSMENTS - Ostomy and/or Continence Assessment and Care []  - 0 Incontinence Assessment and Management []  - 0 Ostomy Care Assessment and Management (repouching, etc.) PROCESS - Coordination of Care []  - 0 Simple Patient / Family Education for ongoing care X- 1 20 Complex (extensive) Patient / Family Education for ongoing care X- 1 10 Staff obtains , Records, T Results / Process Orders est []  - 0 Staff telephones HHA, Nursing Homes / Clarify orders / etc []  - 0 Routine Transfer to another Facility (non-emergent condition) []  - 0 Routine Hospital Admission (non-emergent condition) []  - 0 New Admissions / / Ordering NPWT Apligraf, etc. , []  - 0 Emergency Hospital Admission (emergent condition) []  - 0 Simple Discharge Coordination X- 1 15 Complex (extensive) Discharge Coordination PROCESS - Special Needs []  - 0 Pediatric / Minor Patient Management []  - 0 Isolation Patient Management []  - 0 Hearing / Language / Visual special needs []  - 0 Assessment of Community assistance (transportation, D/C planning, etc.) []  - 0 Additional assistance / Altered mentation []  - 0 Support Surface(s) Assessment (bed, cushion, seat, etc.) INTERVENTIONS - Wound Cleansing /  Measurement X - Simple Wound Cleansing - one wound 1 5 []  - 0 Complex Wound Cleansing - multiple wounds X- 1 5 Wound Imaging (photographs - any number  of wounds) []  - 0 Wound Tracing (instead of photographs) X- 1 5 Simple Wound Measurement - one wound []  - 0 Complex Wound Measurement - multiple wounds INTERVENTIONS - Wound Dressings X - Small Wound Dressing one or multiple wounds 1 10 []  - 0 Medium Wound Dressing one or multiple wounds []  - 0 Large Wound Dressing one or multiple wounds X- 1 5 Application of Medications - topical []  - 0 Application of Medications - injection INTERVENTIONS - Miscellaneous []  - 0 External ear exam X- 1 5 Specimen Collection (cultures, biopsies, blood, body fluids, etc.) X- 1 5 Specimen(s) / Culture(s) sent or taken to Lab for analysis []  - 0 Patient Transfer (multiple staff / / Similar devices) []  - 0 Simple Staple / Suture removal (25 or less) []  - 0 Complex Staple / Suture removal (26 or more) []  - 0 Hypo / Hyperglycemic Management (close monitor of Blood Glucose) []  - 0 Ankle / Brachial Index (ABI) - do not check if billed separately X- 1 5 Vital Signs Has the patient been seen at the hospital within the last three years: Yes Total Score: 115 Level Of Care: New/Established - Level 3 Electronic Signature(s) Signed: 07/04/2022 4:19:15 PM By: RN Entered By: on 07/03/2022 11:52:11 -------------------------------------------------------------------------------- Encounter Discharge Information Details Patient Name: Date of Service: [name], [name] 07/03/2022 10:45 A M Medical Record Number: Nurse, adult Patient Account Number: Date of Birth/Sex: Treating RN: [birth year] (56 y.o. , [name] Primary Care Johnie Makki: 07/06/2022 Other Clinician: Referring Raechell Singleton: Treating Samentha Perham/Extender: Fonnie Mu in Treatment: 3 Encounter Discharge Information  Items Discharge Condition: Stable Ambulatory Status: Ambulatory Discharge Destination: Home Transportation: Private Auto Accompanied By: self Schedule Follow-up Appointment: Yes Clinical Summary of Care: Patient Declined Electronic Signature(s) Signed: 07/04/2022 4:19:15 PM By: 07/05/2022 RN Entered By: Darral Dash on 07/03/2022 11:52:53 -------------------------------------------------------------------------------- Lower Extremity Assessment Details Patient Name: Date of Service: [name], [name] 07/03/2022 10:45 A M Medical Record Number: 12/09/1965 Patient Account Number: [id number] Date of Birth/Sex: Treating RN: Jan 31, 1966 (56 y.o. [name], [name] Primary Care Brance Dartt: 07/06/2022 Other Clinician: Referring Charlene Cowdrey: Treating Rojelio Uhrich/Extender: Fonnie Mu in Treatment: 3 Edema Assessment Assessed: [Left: No] [Right: No] Edema: [Left: Ye] [Right: s] Calf Left: Right: Point of Measurement: 32 cm From Medial Instep 62 cm Ankle Left: Right: Point of Measurement: 10 cm From Medial Instep 32.5 cm Vascular Assessment Pulses: Dorsalis Pedis Palpable: [Right:Yes] Posterior Tibial Palpable: [Right:Yes] Electronic Signature(s) Signed: 07/04/2022 4:19:15 PM By: 07/05/2022 RN Entered By: Darral Dash on 07/03/2022 11:12:21 -------------------------------------------------------------------------------- Multi-Disciplinary Care Plan Details Patient Name: Date of Service: [name], [name] 07/03/2022 10:45 A M Medical Record Number: 12/09/1965 Patient Account Number: [id number] Date of Birth/Sex: Treating RN: July 31, 1966 (56 y.o. [name], [name] Primary Care Zacaria Pousson: 07/06/2022 Other Clinician: Referring Violette Morneault: Treating Makaylynn Bonillas/Extender: Fonnie Mu in Treatment: 3 Active Inactive Wound/Skin Impairment Nursing Diagnoses: Impaired tissue integrity Knowledge deficit related to  ulceration/compromised skin integrity Goals: Patient will have a decrease in wound volume by X% from date: (specify in notes) Date Initiated: 06/12/2022 Target Resolution Date: 07/13/2022 Goal Status: Active Patient/caregiver will verbalize understanding of skin care regimen Date Initiated: 06/12/2022 Target Resolution Date: 07/13/2022 Goal Status: Active Ulcer/skin breakdown will have a volume reduction of 30% by week 4 Date Initiated: 06/12/2022 Target Resolution Date: 07/13/2022 Goal Status: Active Ulcer/skin breakdown will have a volume reduction of 50% by week 8 Date Initiated: 06/12/2022 Target Resolution Date: 07/13/2022  Goal Status: Active Interventions: Assess patient/caregiver ability to obtain necessary supplies Assess patient/caregiver ability to perform ulcer/skin care regimen upon admission and as needed Assess ulceration(s) every visit Notes: Electronic Signature(s) Signed: 07/04/2022 4:19:15 PM By: Rhae Hammock RN Entered By: Rhae Hammock on 07/03/2022 11:28:19 -------------------------------------------------------------------------------- Pain Assessment Details Patient Name: Date of Service: [name], [name] 07/03/2022 10:45 A M Medical Record Number: [id number] Patient Account Number: [id number] Date of Birth/Sex: Treating RN: 10/29/1965 (56 y.o. [name], [name] Primary Care Louine Tenpenny: Stoney Bang Other Clinician: Referring Domanic Matusek: Treating Kerina Simoneau/Extender: Dallas Breeding in Treatment: 3 Active Problems Location of Pain Severity and Description of Pain Patient Has Paino No Site Locations Pain Management and Medication Current Pain Management: Electronic Signature(s) Signed: 07/04/2022 4:19:15 PM By: Rhae Hammock RN Entered By: Rhae Hammock on 07/03/2022 11:11:42 -------------------------------------------------------------------------------- Patient/Caregiver Education Details Patient Name: Date of Service: [name] 9/27/2023andnbsp10:45 A M Medical Record Number: [id number] Patient Account Number: [id number] Date of Birth/Gender: Treating RN: [birth year] (56 y.o. [name], [name] Primary Care Physician: Stoney Bang Other Clinician: Referring Physician: Treating Physician/Extender: Dallas Breeding in Treatment: 3 Education Assessment Education Provided To: Patient Education Topics Provided Wound/Skin Impairment: Methods: Explain/Verbal Responses: Reinforcements needed, State content correctly Electronic Signature(s) Signed: 07/04/2022 4:19:15 PM By: Rhae Hammock RN Entered By: Rhae Hammock on 07/03/2022 11:28:50 -------------------------------------------------------------------------------- Wound Assessment Details Patient Name: Date of Service: [name], [name] 07/03/2022 10:45 A M Medical Record Number: [id number] Patient Account Number: [id number] Date of Birth/Sex: Treating RN: 04-15-1966 (56 y.o. [name], [name] Primary Care Havard Radigan: Stoney Bang Other Clinician: Referring Lexiana Spindel: Treating Sophronia Varney/Extender: Dallas Breeding in Treatment: 3 Wound Status Wound Number: 1 Primary Etiology: Diabetic Wound/Ulcer of the Lower Extremity Wound Location: Right Calcaneus Wound Status: Open Wounding Event: Gradually Appeared Comorbid History: Hypertension, Type II Diabetes, Osteomyelitis Date Acquired: 11/07/2020 Weeks Of Treatment: 3 Clustered Wound: No Photos Wound Measurements Length: (cm) 3.9 Width: (cm) 2.8 Depth: (cm) 0.3 Area: (cm) 8.577 Volume: (cm) 2.573 % Reduction in Area: 26.2% % Reduction in Volume: -10.7% Epithelialization: None Wound Description Classification: Grade 3 Wagner Verification: MRI Wound Margin: Distinct, outline attached Exudate Amount: Medium Exudate Type: Serosanguineous Exudate Color: red, brown Wound Bed Granulation Amount: Large (67-100%) Exposed Structure Granulation Quality:  Red, Pink Fascia Exposed: No Necrotic Amount: Small (1-33%) Fat Layer (Subcutaneous Tissue) Exposed: Yes Necrotic Quality: Adherent Slough Tendon Exposed: No Muscle Exposed: No Joint Exposed: No Bone Exposed: No Treatment Notes Wound #1 (Calcaneus) Wound Laterality: Right Cleanser Soap and Water Discharge Instruction: May shower and wash wound with dial antibacterial soap and water prior to dressing change. Wound Cleanser Discharge Instruction: Cleanse the wound with wound cleanser prior to applying a clean dressing using gauze sponges, not tissue or cotton balls. Peri-Wound Care Triamcinolone 15 (g) Discharge Instruction: Use triamcinolone 15 (g) as directed Zinc Oxide Ointment 30g tube Discharge Instruction: Apply Zinc Oxide to periwound with each dressing change Sween Lotion (Moisturizing lotion) Discharge Instruction: Apply moisturizing lotion as directed Topical Primary Dressing Hydrofera Blue Ready Foam, 4x5 in Discharge Instruction: Apply to wound bed as instructed Secondary Dressing ABD Pad, 5x9 Discharge Instruction: Apply over primary dressing as directed. Optifoam Non-Adhesive Dressing, 4x4 in Discharge Instruction: Make a foam donut and Apply over primary dressing as directed. Woven Gauze Sponge, Non-Sterile 4x4 in Discharge Instruction: Apply over primary dressing as directed. Secured With Elastic Bandage 4 inch (ACE bandage) Discharge Instruction: Secure with ACE bandage as directed. Kerlix Roll Sterile, 4.5x3.1 (in/yd) Discharge Instruction: Secure with  Kerlix as directed. 23M Medipore H Soft Cloth Surgical T ape, 4 x 10 (in/yd) Discharge Instruction: Secure with tape as directed. Compression Wrap Compression Stockings Add-Ons Electronic Signature(s) Signed: 07/04/2022 4:19:15 PM By: Fonnie Mu RN Entered By: Fonnie Mu on 07/03/2022 11:15:27 -------------------------------------------------------------------------------- Vitals  Details Patient Name: Date of Service: [name], [name] 07/03/2022 10:45 A M Medical Record Number: [id number] Patient Account Number: [id number] Date of Birth/Sex: Treating RN: [birth year] (56 y.o. [name], [name] Primary Care Crew Goren: [name] Other Clinician: Referring Yaritza Leist: Treating Diallo Ponder/Extender: [name] in Treatment: 3 Vital Signs Time Taken: 11:04 Temperature (F): 98.8 Height (in): 65 Pulse (bpm): 108 Weight (lbs): 331 Respiratory Rate (breaths/min): 18 Body Mass Index (BMI): 55.1 Blood Pressure (mmHg): 138/84 Reference Range: 80 - 120 mg / dl Electronic Signature(s) Signed: 07/03/2022 4:38:36 PM By: Thayer Dallas Entered By: Thayer Dallas on 07/03/2022 11:04:29

## 2022-07-09 ENCOUNTER — Ambulatory Visit: Payer: BC Managed Care – PPO | Admitting: Internal Medicine

## 2022-07-10 ENCOUNTER — Encounter (HOSPITAL_BASED_OUTPATIENT_CLINIC_OR_DEPARTMENT_OTHER): Payer: BC Managed Care – PPO | Admitting: Physician Assistant

## 2022-07-10 DIAGNOSIS — G894 Chronic pain syndrome: Secondary | ICD-10-CM | POA: Diagnosis not present

## 2022-07-10 DIAGNOSIS — Z6841 Body Mass Index (BMI) 40.0 and over, adult: Secondary | ICD-10-CM | POA: Diagnosis not present

## 2022-07-10 DIAGNOSIS — E559 Vitamin D deficiency, unspecified: Secondary | ICD-10-CM | POA: Diagnosis not present

## 2022-07-10 DIAGNOSIS — L97519 Non-pressure chronic ulcer of other part of right foot with unspecified severity: Secondary | ICD-10-CM | POA: Diagnosis not present

## 2022-07-10 DIAGNOSIS — R030 Elevated blood-pressure reading, without diagnosis of hypertension: Secondary | ICD-10-CM | POA: Diagnosis not present

## 2022-07-15 ENCOUNTER — Encounter: Payer: Self-pay | Admitting: Internal Medicine

## 2022-07-15 ENCOUNTER — Other Ambulatory Visit: Payer: Self-pay

## 2022-07-15 ENCOUNTER — Ambulatory Visit (INDEPENDENT_AMBULATORY_CARE_PROVIDER_SITE_OTHER): Payer: BC Managed Care – PPO | Admitting: Internal Medicine

## 2022-07-15 ENCOUNTER — Telehealth: Payer: Self-pay

## 2022-07-15 VITALS — BP 137/77 | HR 113 | Temp 98.0°F | Ht 65.0 in | Wt 328.0 lb

## 2022-07-15 DIAGNOSIS — M868X7 Other osteomyelitis, ankle and foot: Secondary | ICD-10-CM | POA: Diagnosis not present

## 2022-07-15 NOTE — Progress Notes (Signed)
Patient: [name]  DOB: Dec 20, 1965 MRN: [id number] PCP: Neale Burly, MD  Referring Provider:Hoyt Stone PA-C   Patient Active Problem List   Diagnosis Date Noted   Osteomyelitis of second toe of right foot ([place]) 01/28/2021   Cellulitis 01/26/2021   GERD (gastroesophageal reflux disease) 01/26/2021   Hyperglycemia due to diabetes mellitus ([place]) 01/26/2021   Essential hypertension 01/26/2021   Morbid obesity with BMI of 60.0-69.9, adult ([place]) 01/26/2021   Diabetic foot infection ([place]) 01/26/2021   Wound infection 01/25/2021     Subjective:  Devyne Hauger is a 56 y.o. F with PMHx including lymphedema and type 2 diabetes as below referred for management of left foot wound. She is followed by wound care.  Per patient she has had left heel wound for about a couple years.  She notes that heel wound started after injection into heel.  She underwent right heel debridement and right second toe amputation with podiatry on 02/01/2021. Patient had right heel pain.  Wound care ordered and MRI which showed probable calcaneal early osteomyelitis, no abscess.  She continued to have draining from her wound and wound looked worse.  PCR wound swab obtained which showed polymicrobial growth. She was Rx delafloxacin and referred to infectious disease.  Today: Patient denies fever, chills, nausea vomiting.  She complains wound has been malodorous for some shile, unsure of hte timeling  Review of Systems  All other systems reviewed and are negative.   Past Medical History:  Diagnosis Date   Back pain    Cellulitis    Chronic GERD    Diabetes mellitus without complication ([place])    Hypertension     Outpatient Medications Prior to Visit  Medication Sig Dispense Refill   ARIPiprazole (ABILIFY) 5 MG tablet Take 5 mg by mouth daily.     atorvastatin (LIPITOR) 20 MG tablet Take 20 mg by mouth daily.     clindamycin (CLEOCIN) 300 MG capsule Take 300 mg by mouth 3 (three) times daily.      escitalopram (LEXAPRO) 20 MG tablet Take 1 tablet by mouth daily.     JARDIANCE 25 MG TABS tablet Take 25 mg by mouth daily.     ketoconazole (NIZORAL) 2 % shampoo Apply 1 application topically 2 (two) times a week.     lisinopril (ZESTRIL) 20 MG tablet Take 1 tablet by mouth daily.     magnesium oxide (MAG-OX) 400 (240 Mg) MG tablet Take 400 mg by mouth daily.     Multiple Vitamin (MULTI-VITAMIN) tablet Take 1 tablet by mouth daily.     nystatin (MYCOSTATIN/NYSTOP) powder Apply topically 2 (two) times daily. to affected area(s)     Omega-3 1000 MG CAPS Take 2 capsules by mouth daily.     oxyCODONE-acetaminophen (PERCOCET) 7.5-325 MG tablet Take 1 tablet by mouth 3 (three) times daily as needed.     pantoprazole (PROTONIX) 40 MG tablet Take 1 tablet by mouth daily.     pregabalin (LYRICA) 150 MG capsule Take 150 mg by mouth 2 (two) times daily.     sitaGLIPtin-metformin (JANUMET) 50-500 MG tablet Take 1 tablet by mouth 2 (two) times daily with a meal.     triamcinolone lotion (KENALOG) 0.1 % Apply 1 application topically daily.     Vitamin D, Ergocalciferol, (DRISDOL) 1.25 MG (50000 UNIT) CAPS capsule Take 50,000 Units by mouth once a week.     No facility-administered medications prior to visit.     No Known Allergies  Social History  Tobacco Use   Smoking status: Never   Smokeless tobacco: Never  Vaping Use   Vaping Use: Never used  Substance Use Topics   Alcohol use: Never   Drug use: Never    No family history on file.  Objective:  There were no vitals filed for this visit. There is no height or weight on file to calculate BMI.  Physical Exam Constitutional:      Appearance: Normal appearance.  HENT:     Head: Normocephalic and atraumatic.     Right Ear: Tympanic membrane normal.     Left Ear: Tympanic membrane normal.     Nose: Nose normal.     Mouth/Throat:     Mouth: Mucous membranes are moist.  Eyes:     Extraocular Movements: Extraocular movements intact.      Conjunctiva/sclera: Conjunctivae normal.     Pupils: Pupils are equal, round, and reactive to light.  Cardiovascular:     Rate and Rhythm: Normal rate and regular rhythm.     Heart sounds: No murmur heard.    No friction rub. No gallop.  Pulmonary:     Effort: Pulmonary effort is normal.     Breath sounds: Normal breath sounds.  Abdominal:     General: Abdomen is flat.     Palpations: Abdomen is soft.  Musculoskeletal:        General: Normal range of motion.  Skin:    General: Skin is warm and dry.  Neurological:     General: No focal deficit present.     Mental Status: She is alert and oriented to person, place, and time.  Psychiatric:        Mood and Affect: Mood normal.     Lab Results: Lab Results  Component Value Date   WBC 7.6 03/21/2022   HGB 13.1 03/21/2022   HCT 40.3 03/21/2022   MCV 85.9 03/21/2022   PLT 361 03/21/2022    Lab Results  Component Value Date   CREATININE 0.42 (L) 03/21/2022   BUN 14 03/21/2022   NA 137 03/21/2022   K 3.8 03/21/2022   CL 103 03/21/2022   CO2 26 03/21/2022    Lab Results  Component Value Date   ALT 13 01/26/2021   AST 12 (L) 01/26/2021   ALKPHOS 79 01/26/2021   BILITOT 0.8 01/26/2021     Assessment & Plan:  #Plantar calcaneal OM with  open draining malodorous wound -Pt reports heel wound started after injection was given about 2 years ago. Marland Kitchen Underwent debridement of right heel, amputation of distal Rt 2nd toe(for OM) on 02/01/21 with podiatry. She is followed by wound care and MRI on 9/25 noted plantar heel soft tissue ulcer w/ soft tissue inflammation extending to plantar and dorsal surface of calcaneus of probable early osteomyelitis, no abscess. On 9/27 wound care noted wound was worse, she was started delaflaxacin and referred to ID.  -Cx taken with wound care on 07/03/22 are swabs not bone Bx and I expect they will be polymicrobial(Strep anginosus, Staph aures, Ecli, PsA, E faecalis, CONS) and is of unclear clinical  significance - OR Cx of TISSUE on 02/01/22 grew PsA, Staph lugdensis and Efaecals again these are not bone biopsies. With that said, Delafloxacin is broad range antibiotics that should cover microbes form 02/01/21. -Continue Delofloxacin 450 mg PO bid until next ID visit. I am referring her to podiatry as the purulent drainage is extremely malodorous. I discussed that antibiotics alone are unlikely to be curative in the  setting of calcaneal osteomyelitis and significant drainage and she will likely need surgical intervention. -Additionally, pt asked if this "is something she could see a lawyer for". I conveyed that is not within my scope of practice, Plan: -Labs today: cbc, cmp, esr and crp -Continue Delafloxacin 460m PO bid -Referral to podiatry -ABI -Follow-up with ID on 10/19   #DM  -A1c 8.7 on 08/23/21  #Reported hx of lymphedema -management per primary  -Of note although OR Cx from right heal Needs OR for management. Antibiotics Next visit with wound care on 07/17/22 [facility] I have personally spent 65 minutes involved in face-to-face and non-face-to-face activities for this patient on the day of the visit. Professional time spent includes the following activities: Preparing to see the patient (review of tests), Obtaining and/or reviewing separately obtained history (admission/discharge record), Performing a medically appropriate examination and/or evaluation , Ordering medications/tests/procedures, referring and communicating with other health care professionals, Documenting clinical information in the EMR, Independently interpreting results (not separately reported), Communicating results to the patient/family/caregiver, Counseling and educating the patient/family/caregiver and Care coordination (not separately reported).   07/15/22  10:47 AM

## 2022-07-15 NOTE — Telephone Encounter (Signed)
-----   Message from Laurice Record, MD sent at 07/15/2022  4:40 PM EDT ----- I ordered ABI (lower extremity ultrasound)on patient to look for arterial disease. Could you call patient and let her know. It is just part of the work-up.

## 2022-07-15 NOTE — Telephone Encounter (Signed)
Called patient to update her on ABI order, no answer. Left HIPAA compliant voicemail requesting callback.   Will need to find out her availability before scheduling with vascular. Margaret checking if pre-cert is required.   Beryle Flock, RN

## 2022-07-16 LAB — CBC WITH DIFFERENTIAL/PLATELET
Absolute Monocytes: 1032 cells/uL — ABNORMAL HIGH (ref 200–950)
Basophils Absolute: 43 cells/uL (ref 0–200)
Basophils Relative: 0.5 %
Eosinophils Absolute: 344 cells/uL (ref 15–500)
Eosinophils Relative: 4 %
HCT: 36.5 % (ref 35.0–45.0)
Hemoglobin: 12.1 g/dL (ref 11.7–15.5)
Lymphs Abs: 783 cells/uL — ABNORMAL LOW (ref 850–3900)
MCH: 27.3 pg (ref 27.0–33.0)
MCHC: 33.2 g/dL (ref 32.0–36.0)
MCV: 82.4 fL (ref 80.0–100.0)
MPV: 10.1 fL (ref 7.5–12.5)
Monocytes Relative: 12 %
Neutro Abs: 6398 cells/uL (ref 1500–7800)
Neutrophils Relative %: 74.4 %
Platelets: 478 10*3/uL — ABNORMAL HIGH (ref 140–400)
RBC: 4.43 10*6/uL (ref 3.80–5.10)
RDW: 13.2 % (ref 11.0–15.0)
Total Lymphocyte: 9.1 %
WBC: 8.6 10*3/uL (ref 3.8–10.8)

## 2022-07-16 LAB — COMPLETE METABOLIC PANEL WITH GFR
AG Ratio: 0.9 (calc) — ABNORMAL LOW (ref 1.0–2.5)
ALT: 7 U/L (ref 6–29)
AST: 10 U/L (ref 10–35)
Albumin: 3.2 g/dL — ABNORMAL LOW (ref 3.6–5.1)
Alkaline phosphatase (APISO): 114 U/L (ref 37–153)
BUN/Creatinine Ratio: 21 (calc) (ref 6–22)
BUN: 8 mg/dL (ref 7–25)
CO2: 29 mmol/L (ref 20–32)
Calcium: 8.9 mg/dL (ref 8.6–10.4)
Chloride: 101 mmol/L (ref 98–110)
Creat: 0.38 mg/dL — ABNORMAL LOW (ref 0.50–1.03)
Globulin: 3.7 g/dL (calc) (ref 1.9–3.7)
Glucose, Bld: 147 mg/dL — ABNORMAL HIGH (ref 65–99)
Potassium: 3.7 mmol/L (ref 3.5–5.3)
Sodium: 138 mmol/L (ref 135–146)
Total Bilirubin: 1.2 mg/dL (ref 0.2–1.2)
Total Protein: 6.9 g/dL (ref 6.1–8.1)
eGFR: 119 mL/min/{1.73_m2} (ref >=60)

## 2022-07-16 LAB — SEDIMENTATION RATE: Sed Rate: 96 mm/h — ABNORMAL HIGH (ref 0–30)

## 2022-07-16 LAB — C-REACTIVE PROTEIN: CRP: 240.2 mg/L — ABNORMAL HIGH (ref <8.0)

## 2022-07-16 NOTE — Telephone Encounter (Signed)
Procedure authorized with [employer], will need to provide authorization # [id number] when scheduling with vascular.   Authorization valid through 08/14/22.  Beryle Flock, RN

## 2022-07-17 ENCOUNTER — Encounter (HOSPITAL_BASED_OUTPATIENT_CLINIC_OR_DEPARTMENT_OTHER): Payer: BC Managed Care – PPO | Attending: Physician Assistant | Admitting: Physician Assistant

## 2022-07-17 DIAGNOSIS — Z89429 Acquired absence of other toe(s), unspecified side: Secondary | ICD-10-CM | POA: Insufficient documentation

## 2022-07-17 DIAGNOSIS — I10 Essential (primary) hypertension: Secondary | ICD-10-CM | POA: Insufficient documentation

## 2022-07-17 DIAGNOSIS — L97412 Non-pressure chronic ulcer of right heel and midfoot with fat layer exposed: Secondary | ICD-10-CM | POA: Diagnosis not present

## 2022-07-17 DIAGNOSIS — L97512 Non-pressure chronic ulcer of other part of right foot with fat layer exposed: Secondary | ICD-10-CM | POA: Diagnosis not present

## 2022-07-17 DIAGNOSIS — I890 Lymphedema, not elsewhere classified: Secondary | ICD-10-CM | POA: Insufficient documentation

## 2022-07-17 DIAGNOSIS — E11621 Type 2 diabetes mellitus with foot ulcer: Secondary | ICD-10-CM | POA: Insufficient documentation

## 2022-07-17 DIAGNOSIS — E1151 Type 2 diabetes mellitus with diabetic peripheral angiopathy without gangrene: Secondary | ICD-10-CM | POA: Insufficient documentation

## 2022-07-17 NOTE — Progress Notes (Addendum)
[name], [name] Reichmann ([id number]) [id number] Page 1 of 9 Visit Report for 07/17/2022 Chief Complaint Document Details Patient Name: Date of Service: [name] [name] [name] [name] 07/17/2022 10:45 A M Medical Record Number: [id number] Patient Account Number: [id number] Date of Birth/Sex: Treating RN: 1966/02/14 (56 y.o. [name] [name], [name] Primary Care Provider: Stoney Bang Other Clinician: Referring Provider: Treating Provider/Extender: Dallas Breeding in Treatment: 5 Information Obtained from: Patient Chief Complaint Right heel ulcer Electronic Signature(s) Signed: 07/17/2022 11:16:10 AM By: Worthy Keeler PA-C Entered By: Worthy Keeler on 07/17/2022 11:16:10 -------------------------------------------------------------------------------- Debridement Details Patient Name: Date of Service: [name] [name] [name] [name] 07/17/2022 10:45 A M Medical Record Number: [id number] Patient Account Number: [id number] Date of Birth/Sex: Treating RN: [birth year] (56 y.o. Donalda Ewings Primary Care Provider: Stoney Bang Other Clinician: Referring Provider: Treating Provider/Extender: Dallas Breeding in Treatment: 5 Debridement Performed for Assessment: Wound #1 Right Calcaneus Performed By: Physician Worthy Keeler, PA Debridement Type: Debridement Severity of Tissue Pre Debridement: Fat layer exposed Level of Consciousness (Pre-procedure): Awake and Alert Pre-procedure Verification/Time Out Yes - 11:32 Taken: Start Time: 11:32 Pain Control: Lidocaine 5% topical ointment T Area Debrided (L x W): otal 2 (cm) x 1 (cm) = 2 (cm) Tissue and other material debrided: Non-Viable, Skin: Dermis , Skin: Epidermis Level: Skin/Epidermis Debridement Description: Selective/Open Wound Instrument: Curette Bleeding: Minimum Hemostasis Achieved: Pressure Procedural Pain: 0 Post Procedural Pain: 0 Response to Treatment: Procedure was tolerated well Level of  Consciousness (Post- Awake and Alert procedure): Post Debridement Measurements of Total Wound Length: (cm) 10.7 Width: (cm) 5 Depth: (cm) 1.1 Volume: (cm) 46.221 Character of Wound/Ulcer Post Debridement: Improved Severity of Tissue Post Debridement: Fat layer exposed Rayo, Moxie ([id number]) [id number] Page 2 of 9 Post Procedure Diagnosis Same as Pre-procedure Notes scribed for Jeri Cos, PA by Sharyn Creamer, RN Electronic Signature(s) Signed: 07/17/2022 5:12:24 PM By: Worthy Keeler PA-C Signed: 07/22/2022 1:44:39 PM By: Sharyn Creamer RN, BSN Entered By: Sharyn Creamer on 07/17/2022 16:40:23 -------------------------------------------------------------------------------- Debridement Details Patient Name: Date of Service: [name] [name] [name] [name] 07/17/2022 10:45 A M Medical Record Number: [id number] Patient Account Number: [id number] Date of Birth/Sex: Treating RN: 07-22-1966 (56 y.o. Donalda Ewings Primary Care Provider: Stoney Bang Other Clinician: Referring Provider: Treating Provider/Extender: Dallas Breeding in Treatment: 5 Debridement Performed for Assessment: Wound #2 Right T Second oe Performed By: Physician Worthy Keeler, PA Debridement Type: Debridement Severity of Tissue Pre Debridement: Fat layer exposed Level of Consciousness (Pre-procedure): Awake and Alert Pre-procedure Verification/Time Out Yes - 11:32 Taken: Start Time: 11:32 Pain Control: Lidocaine 5% topical ointment T Area Debrided (L x W): otal 0.7 (cm) x 1 (cm) = 0.7 (cm) Tissue and other material debrided: Non-Viable, Skin: Dermis , Skin: Epidermis Level: Skin/Epidermis Debridement Description: Selective/Open Wound Instrument: Curette Bleeding: Minimum Hemostasis Achieved: Pressure Procedural Pain: 0 Post Procedural Pain: 0 Response to Treatment: Procedure was tolerated well Level of Consciousness (Post- Awake and Alert procedure): Post  Debridement Measurements of Total Wound Length: (cm) 0.7 Width: (cm) 1 Depth: (cm) 0.2 Volume: (cm) 0.11 Character of Wound/Ulcer Post Debridement: Improved Severity of Tissue Post Debridement: Fat layer exposed Post Procedure Diagnosis Same as Pre-procedure Notes scribed for Jeri Cos, PA by Sharyn Creamer, RN Electronic Signature(s) Signed: 07/17/2022 5:12:24 PM By: Worthy Keeler PA-C Signed: 07/22/2022 1:44:39 PM By: Sharyn Creamer RN, BSN Entered By: Sharyn Creamer on 07/17/2022 16:40:34 [name] [name] [name] [name] ([id number]) [id number] Page 3 of 9 -------------------------------------------------------------------------------- HPI Details Patient Name: Date of Service: [name] [name]  07/17/2022 10:45 A M Medical Record Number: [id number] Patient Account Number: [id number] Date of Birth/Sex: Treating RN: 03/01/1966 (56 y.o. [name] [name]) [name], [name] Primary Care Provider: Lia HoppingHasanaj, [name] Other Clinician: Referring Provider: Treating Provider/Extender: [name] [name] [name] [name], [name] Weeks in Treatment: 5 History of Present Illness HPI Description: 06-13-2022 upon evaluation today patient presents for evaluation of her right heel ulcer. She is having a tremendous amount of pain at this location. She tells me that her most recent hemoglobin A1c was 8.7 and that was on 08-23-2021. This ulcer on the heel has been present she states since February 2023 when she had an injection to the heel by podiatry. She states that she began to have increasing pain the wound opened and has been open ever since. 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 She had an x-ray in June if anything can although there did not appear to be any obvious evidence of osteomyelitis at that point. She subsequently has had OR debridement several times of the wound performed by podiatry. Patient does have a history of lymphedema of the lower extremities she is also a type II  diabetic. 06-19-2022 upon evaluation today patient appears to be doing better in regard to the size of her leg which is significantly improved. With that being said she had a lot of drainage from the heel which is completely understandable considering what is going on at this time. There does not appear to be any evidence of active infection there is no warmth or irritation to the leg in general. With that being said the patient does have an issue here with the amount of drainage that is going on I definitely think Zetuvit is good to be the better way to go in place of ABD pads. Also think that she could potentially benefit from 3 times per week dressing changes but again right now we are going to stick with the to change it today, change in her Friday, and then subsequently seeing where things stand next Wednesday. The other option would be to change her to a different day for wound care having her come then say like a Tuesday Friday or Monday Thursday. 06-26-2022 upon evaluation patient's wound bed actually showed signs of doing well in regard to the overall size it was measuring slightly smaller. With that being said unfortunately the biggest issue we see is she still has a tremendous amount of drainage which is what could keep this from being able to use a total contact cast. For that reason I am did going to discuss with her today the possibility of trying Tubigrip to see if that could be of benefit for her. She voiced understanding and is in agreement with giving this a try. 07-03-2022 upon evaluation today patient unfortunately appears to be doing significantly worse at this point in regard to her swelling due to the fact that she was unable to really wear the Tubigrip. She tells me that it was cutting into her. With that being said she also did have an MRI this MRI revealed that she does have osteomyelitis in the heel this was actually just performed Monday, 25 September. This does show signs of  "early osteomyelitis". Nonetheless this is still concerning to me. The wound also appears to be getting deeper in the center aspect of this which has me a little concerned as well. I do believe that she is likely going require an aggressive approach here to try to get things better. I discussed that  in greater detail with her today. I will detailed in the plan. 07-17-2022 upon evaluation today patient appears to be doing poorly in regard to her wound. Unfortunately she does not show any signs of infection systemically though locally this seems to be doing worse with the wound actually being bigger than where it was previous. She did have an MRI that showed evidence of osteomyelitis actually recommended a referral to ID she was evaluated infectious disease and they recommended referral to podiatry and to be honest have recommended amputation based on what the patient tells me today. With that being said this is something that she is not interested in at all to be perfectly honest. For that reason she wants to know what she should do and where she should go at this point that is where the majority of the conversation which was quite lengthy today went. Obviously understand her concern here but I think she is going to have to definitely get off this and likely this means she is going to need to come out of work. Electronic Signature(s) Signed: 07/17/2022 1:54:48 PM By: Lenda Kelp PA-C Entered By: Lenda Kelp on 07/17/2022 13:54:47 -------------------------------------------------------------------------------- Physical Exam Details Patient Name: Date of Service: [name] [name] [name] [name] 07/17/2022 10:45 A M Medical Record Number: [id number] Patient Account Number: [id number] Date of Birth/Sex: Treating RN: August 01, 1966 (56 y.o. Toniann Fail Primary Care Provider: [name] Other Clinician: Referring Provider: Treating Provider/Extender: [name] [place] in Treatment:  5 Constitutional Obese and well-hydrated in no acute distress. Respiratory normal breathing without difficulty. Hutsell, [name] [name] ([id number]) [id number] Page 4 of 9 Psychiatric this patient is able to make decisions and demonstrates good insight into disease process. Alert and Oriented x 3. pleasant and cooperative. Notes Upon inspection based on what I am seeing I do believe that the patient seems to be doing more poorly compared to normal and honestly I am somewhat concerned about the fact that her infection potentially seems to be getting worse with the wound enlarging and I think that offloading significantly which means probably coming out of work is going to be necessary. That is been an issue up to this point as she told me she was out of FMLA time. Electronic Signature(s) Signed: 07/17/2022 1:55:15 PM By: Lenda Kelp PA-C Entered By: Lenda Kelp on 07/17/2022 13:55:15 -------------------------------------------------------------------------------- Physician Orders Details Patient Name: Date of Service: JAKEISHA, STRICKER 07/17/2022 10:45 A M Medical Record Number: [id number] Patient Account Number: [id number] Date of Birth/Sex: Treating RN: 03-20-1966 (56 y.o. 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 - Wednesday w/ Allen Derry and Maryruth Bun Rm # 9 Return A Anesthetic (In clinic) Topical Lidocaine 5% applied to wound bed Bathing/ Shower/ Hygiene May shower with protection but do not get wound dressing(s) wet. Edema Control -  Lymphedema / SCD / Other Elevate legs to the level of the heart or above for 30 minutes daily and/or when sitting, a frequency of: Avoid standing for long periods of time. Off-Loading Open toe surgical shoe to: - w/ peg assist Wound Treatment Wound #1 - Calcaneus Wound Laterality: Right Cleanser: Soap and Water 1 x Per Day/15 Days Discharge  Instructions: May shower and wash wound with dial antibacterial soap and water prior to dressing change. Cleanser: Wound Cleanser (Generic) 1 x Per Day/15 Days Discharge Instructions: Cleanse the wound with wound cleanser prior to applying a clean dressing using gauze sponges, not tissue or cotton balls. Peri-Wound Care: Triamcinolone 15 (g) 1 x Per Day/15 Days Discharge Instructions: Use triamcinolone 15 (g) as directed Peri-Wound Care: Zinc Oxide Ointment 30g tube 1 x Per Day/15 Days Discharge Instructions: Apply Zinc Oxide to periwound with each dressing change Peri-Wound Care: Sween Lotion (Moisturizing lotion) 1 x Per Day/15 Days Discharge Instructions: Apply moisturizing lotion as directed Prim Dressing: Hydrofera Blue Ready Foam, 4x5 in (Generic) 1 x Per Day/15 Days ary Discharge Instructions: Apply to wound bed as instructed Secondary Dressing: ABD Pad, 5x9 (Generic) 1 x Per Day/15 Days Bibb, Britanie ([id number]) [id number] Page 5 of 9 Discharge Instructions: Apply over primary dressing as directed. Secondary Dressing: Optifoam Non-Adhesive Dressing, 4x4 in (Generic) 1 x Per Day/15 Days Discharge Instructions: Make a foam donut and Apply over primary dressing as directed. Secondary Dressing: Woven Gauze Sponge, Non-Sterile 4x4 in (Generic) 1 x Per Day/15 Days Discharge Instructions: Apply over primary dressing as directed. Secured With: Elastic Bandage 4 inch (ACE bandage) (Generic) 1 x Per Day/15 Days Discharge Instructions: Secure with ACE bandage as directed. Secured With: [employer], 4.5x3.1 (in/yd)  (Generic) 1 x Per Day/15 Days Discharge Instructions: Secure with Kerlix as directed. Secured With: 57M Medipore H Soft Cloth Surgical T ape, 4 x 10 (in/yd) (Generic) 1 x Per Day/15 Days Discharge Instructions: Secure with tape as directed. Wound #2 - T Second oe Wound Laterality: Right Cleanser: Soap and Water 1 x Per Day/15 Days Discharge Instructions: May shower and wash wound with dial antibacterial soap and water prior to dressing change. Cleanser: Wound Cleanser (Generic) 1 x Per Day/15 Days Discharge Instructions: Cleanse the wound with wound cleanser prior to applying a clean dressing using gauze sponges, not tissue or cotton balls. Peri-Wound Care: Triamcinolone 15 (g) 1 x Per Day/15 Days Discharge Instructions: Use triamcinolone 15 (g) as directed Peri-Wound Care: Zinc Oxide Ointment 30g tube 1 x Per Day/15 Days Discharge Instructions: Apply Zinc Oxide to periwound with each dressing change Peri-Wound Care: Sween Lotion (Moisturizing lotion) 1 x Per Day/15 Days Discharge Instructions: Apply moisturizing lotion as directed Prim Dressing: Hydrofera Blue Ready Foam, 4x5 in (Generic) 1 x Per Day/15 Days ary Discharge Instructions: Apply to wound bed as instructed Secondary Dressing: Woven Gauze Sponge, Non-Sterile 4x4 in (Generic) 1 x Per Day/15 Days Discharge Instructions: Apply over primary dressing as directed. Secured With: 57M Medipore H Soft Cloth Surgical T ape, 4 x 10 (in/yd) (Generic) 1 x Per Day/15 Days Discharge Instructions: Secure with tape as directed. Patient Medications llergies: No Known Allergies A Notifications Medication Indication Start End prior to debridement 07/17/2022 lidocaine DOSE topical 5 % ointment - ointment topical Electronic Signature(s) Signed: 07/17/2022 5:12:24 PM By: Lenda Kelp PA-C Signed: 07/22/2022 1:44:39 PM By: Redmond Pulling RN, BSN Entered By: Redmond Pulling on 07/17/2022  11:54:24 -------------------------------------------------------------------------------- Problem List Details Patient Name: Date of Service: [name] [name] [name] [name] 07/17/2022 10:45 A M Medical Record Number: [id number] Patient Account Number: [id number] Date of Birth/Sex: Treating RN: September 05, 1966 (56 y.o. 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 [name] [name], [name] Primary Care Provider: [name] Other Clinician: Referring Provider: Treating Provider/Extender: [name] [place] in Treatment: 5 Subjective Chief Complaint Information obtained from Patient Right heel ulcer History of Present Illness (HPI) 06-13-2022 upon evaluation today patient presents for evaluation of her right heel ulcer. She is having a tremendous amount of pain at this location. She tells me that her most recent hemoglobin A1c was 8.7 and that was on 08-23-2021. This ulcer on the heel has been present she states since February 2023 when she had  an injection to the heel by podiatry. She states that she began to have increasing pain the wound opened and has been open ever since. 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 She had an x-ray in June if anything can although there did not appear to be any obvious evidence of osteomyelitis at that point. She subsequently has had OR debridement several times of the wound performed by podiatry. Patient does have a history of lymphedema of the lower extremities she is also a type II diabetic. 06-19-2022 upon evaluation today patient appears to be doing better in regard to the size of her leg which is significantly improved. With that being said she had a lot of drainage from the heel which is completely understandable considering what is going on at this time. There does not appear to be any evidence of active infection there is no warmth or irritation to the leg in general. With that being said the patient does have an issue here with the amount of drainage that is going on I definitely think Zetuvit is good to be the better way to go in place of ABD pads. Also think that she could potentially benefit from 3 times per week dressing changes but again right now we are going to stick with the to change it today, change in her Friday, and then subsequently seeing where things stand next Wednesday. The other option would be to change her to a different day for wound care having her come then say like a Tuesday Friday or Monday Thursday. 06-26-2022 upon evaluation patient's wound bed actually showed signs of doing well in regard to the overall size it was measuring slightly smaller. With that being said unfortunately the biggest issue we see is she still has a tremendous amount of drainage which is what could keep this from being able to use a total contact cast. For that reason I am did going to discuss with her today the possibility of trying Tubigrip to see if that could be of  benefit for her. She voiced understanding and is in agreement with giving this a try. 07-03-2022 upon evaluation today patient unfortunately appears to be doing significantly worse at this point in regard to her swelling due to the fact that she was unable to really wear the Tubigrip. She tells me that it was cutting into her. With that being said she also did have an MRI this MRI revealed  that she does have osteomyelitis in the heel this was actually just performed Monday, 25 September. This does show signs of "early osteomyelitis". Nonetheless this is still concerning to me. The wound also appears to be getting deeper in the center aspect of this which has me a little concerned as well. I do believe that she is likely going require an aggressive approach here to try to get things better. I discussed that in greater detail with her today. I will detailed in the plan. 07-17-2022 upon evaluation today patient appears to be doing poorly in regard to her wound. Unfortunately she does not show any signs of infection systemically though locally this seems to be doing worse with the wound actually being bigger than where it was previous. She did have an MRI that showed [name] [name] [name] [name] ([id number]) [id number] Page 7 of 9 evidence of osteomyelitis actually recommended a referral to ID she was evaluated infectious disease and they recommended referral to podiatry and to be honest have recommended amputation based on what the patient tells me today. With that being said this is something that she is not interested in at all to be perfectly honest. For that reason she wants to know what she should do and where she should go at this point that is where the majority of the conversation which was quite lengthy today went. Obviously understand her concern here but I think she is going to have to definitely get off this and likely this means she is going to need to come out of  work. Objective Constitutional Obese and well-hydrated in no acute distress. Vitals Time Taken: 10:55 AM, Height: 65 in, Weight: 331 lbs, BMI: 55.1, Temperature: 98.0 [name] [name], Pulse: 120 bpm, Respiratory Rate: 18 breaths/min, Blood Pressure: 133/73 mmHg. Respiratory normal breathing without difficulty. Psychiatric this patient is able to make decisions and demonstrates good insight into disease process. Alert and Oriented x 3. pleasant and cooperative. General Notes: Upon inspection based on what I am seeing I do believe that the patient seems to be doing more poorly compared to normal and honestly I am somewhat concerned about the fact that her infection potentially seems to be getting worse with the wound enlarging and I think that offloading significantly which means probably coming out of work is going to be necessary. That is been an issue up to this point as she told me she was out of FMLA time. Integumentary (Hair, Skin) Wound #1 status is Open. Original cause of wound was Gradually Appeared. The date acquired was: 11/07/2020. The wound has been in treatment 5 weeks. The wound is located on the Right Calcaneus. The wound measures 10.7cm length x 5cm width x 1.1cm depth; 42.019cm^2 area and 46.221cm^3 volume. There is Fat Layer (Subcutaneous Tissue) exposed. There is no tunneling or undermining noted. There is a medium amount of serosanguineous drainage noted. The wound margin is distinct with the outline attached to the wound base. There is large (67-100%) red, pink granulation within the wound bed. There is a small (1- 33%) amount of necrotic tissue within the wound bed including Adherent Slough. The periwound skin appearance exhibited: Maceration. Wound #2 status is Open. Original cause of wound was Gradually Appeared. The date acquired was: 07/17/2022. The wound is located on the Right T Second. oe The wound measures 0.7cm length x 1cm width x 0.2cm depth; 0.55cm^2 area and 0.11cm^3 volume.  There is Fat Layer (Subcutaneous Tissue) exposed. There is no tunneling or undermining noted. There is a medium amount of serosanguineous  drainage noted. There is large (67-100%) red granulation within the wound bed. There is a small (1-33%) amount of necrotic tissue within the wound bed including Adherent Slough. The periwound skin appearance exhibited: Maceration. Periwound temperature was noted as No Abnormality. Assessment Active Problems ICD-10 Type 2 diabetes mellitus with foot ulcer Non-pressure chronic ulcer of right heel and midfoot with fat layer exposed Lymphedema, not elsewhere classified Procedures Wound #1 Pre-procedure diagnosis of Wound #1 is a Diabetic Wound/Ulcer of the Lower Extremity located on the Right Calcaneus .Severity of Tissue Pre Debridement is: Fat layer exposed. There was a Selective/Open Wound Skin/Epidermis Debridement with a total area of 2 sq cm performed by Lenda Kelp, PA. With the following instrument(s): Curette to remove Non-Viable tissue/material. Material removed includes Skin: Dermis and Skin: Epidermis and after achieving pain control using Lidocaine 5% topical ointment. No specimens were taken. A time out was conducted at 11:32, prior to the start of the procedure. A Minimum amount of bleeding was controlled with Pressure. The procedure was tolerated well with a pain level of 0 throughout and a pain level of 0 following the procedure. Post Debridement Measurements: 10.7cm length x 5cm width x 1.1cm depth; 46.221cm^3 volume. Character of Wound/Ulcer Post Debridement is improved. Severity of Tissue Post Debridement is: Fat layer exposed. Post procedure Diagnosis Wound #1: Same as Pre-Procedure Wound #2 Pre-procedure diagnosis of Wound #2 is a Diabetic Wound/Ulcer of the Lower Extremity located on the Right T Second .Severity of Tissue Pre Debridement oe is: Fat layer exposed. There was a Selective/Open Wound Skin/Epidermis Debridement with a total  area of 0.7 sq cm performed by Lenda Kelp, PA. With the following instrument(s): Curette to remove Non-Viable tissue/material. Material removed includes Skin: Dermis and Skin: Epidermis and after achieving pain control using Lidocaine 5% topical ointment. No specimens were taken. A time out was conducted at 11:32, prior to the start of the procedure. A Minimum amount of bleeding was controlled with Pressure. The procedure was tolerated well with a pain level of 0 throughout and a pain level of 0 following the procedure. Post Debridement Measurements: 0.7cm length x 1cm width x 0.2cm depth; 0.11cm^3 volume. Character of Wound/Ulcer Post Debridement is improved. Severity of Tissue Post Debridement is: Fat layer exposed. Post procedure Diagnosis Wound #2: Same as Pre-Procedure [name] [name] [name] [name] ([id number]) [id number] Page 8 of 9 Plan Follow-up Appointments: Return Appointment in 1 week. - Wednesday w/ Allen Derry and Maryruth Bun Rm # 9 Anesthetic: (In clinic) Topical Lidocaine 5% applied to wound bed Bathing/ Shower/ Hygiene: May shower with protection but do not get wound dressing(s) wet. Edema Control - Lymphedema / SCD / Other: Elevate legs to the level of the heart or above for 30 minutes daily and/or when sitting, a frequency of: Avoid standing for long periods of time. Off-Loading: Open toe surgical shoe to: - w/ peg assist The following medication(s) was prescribed: lidocaine topical 5 % ointment ointment topical for prior to debridement was prescribed at facility WOUND #1: - Calcaneus Wound Laterality: Right Cleanser: Soap and Water 1 x Per Day/15 Days Discharge Instructions: May shower and wash wound with dial antibacterial soap and water prior to dressing change. Cleanser: Wound Cleanser (Generic) 1 x Per Day/15 Days Discharge Instructions: Cleanse the wound with wound cleanser prior to applying a clean dressing using gauze sponges, not tissue or cotton  balls. Peri-Wound Care: Triamcinolone 15 (g) 1 x Per Day/15 Days Discharge Instructions: Use triamcinolone 15 (g) as directed Peri-Wound Care: Zinc Oxide Ointment 30g  tube 1 x Per Day/15 Days Discharge Instructions: Apply Zinc Oxide to periwound with each dressing change Peri-Wound Care: Sween Lotion (Moisturizing lotion) 1 x Per Day/15 Days Discharge Instructions: Apply moisturizing lotion as directed Prim Dressing: Hydrofera Blue Ready Foam, 4x5 in (Generic) 1 x Per Day/15 Days ary Discharge Instructions: Apply to wound bed as instructed Secondary Dressing: ABD Pad, 5x9 (Generic) 1 x Per Day/15 Days Discharge Instructions: Apply over primary dressing as directed. Secondary Dressing: Optifoam Non-Adhesive Dressing, 4x4 in (Generic) 1 x Per Day/15 Days Discharge Instructions: Make a foam donut and Apply over primary dressing as directed. Secondary Dressing: Woven Gauze Sponge, Non-Sterile 4x4 in (Generic) 1 x Per Day/15 Days Discharge Instructions: Apply over primary dressing as directed. Secured With: Elastic Bandage 4 inch (ACE bandage) (Generic) 1 x Per Day/15 Days Discharge Instructions: Secure with ACE bandage as directed. Secured With: [employer], 4.5x3.1 (in/yd) (Generic) 1 x Per Day/15 Days Discharge Instructions: Secure with Kerlix as directed. Secured With: 61M Medipore H Soft Cloth Surgical T ape, 4 x 10 (in/yd) (Generic) 1 x Per Day/15 Days Discharge Instructions: Secure with tape as directed. WOUND #2: - T Second Wound Laterality: Right oe Cleanser: Soap and Water 1 x Per Day/15 Days Discharge Instructions: May shower and wash wound with dial antibacterial soap and water prior to dressing change. Cleanser: Wound Cleanser (Generic) 1 x Per Day/15 Days Discharge Instructions: Cleanse the wound with wound cleanser prior to applying a clean dressing using gauze sponges, not tissue or cotton balls. Peri-Wound Care: Triamcinolone 15 (g) 1 x Per Day/15 Days Discharge  Instructions: Use triamcinolone 15 (g) as directed Peri-Wound Care: Zinc Oxide Ointment 30g tube 1 x Per Day/15 Days Discharge Instructions: Apply Zinc Oxide to periwound with each dressing change Peri-Wound Care: Sween Lotion (Moisturizing lotion) 1 x Per Day/15 Days Discharge Instructions: Apply moisturizing lotion as directed Prim Dressing: Hydrofera Blue Ready Foam, 4x5 in (Generic) 1 x Per Day/15 Days ary Discharge Instructions: Apply to wound bed as instructed Secondary Dressing: Woven Gauze Sponge, Non-Sterile 4x4 in (Generic) 1 x Per Day/15 Days Discharge Instructions: Apply over primary dressing as directed. Secured With: 61M Medipore H Soft Cloth Surgical T ape, 4 x 10 (in/yd) (Generic) 1 x Per Day/15 Days Discharge Instructions: Secure with tape as directed. 1. Based on what I am seeing I do believe that aggressive offloading is good to be absolutely necessary with regard to this patient and getting her healed. I discussed that with her today and she is aware of this but again unsure of exactly what she is going to do to make that happen considering the fact that she is out of FMLA time to take off of work. 2. More importantly and immediately even is that she does appear to have signs of early and acute osteomyelitis which unfortunately is I think can only get worse if we do not treat this. We discussed the fact that there really is not anything good as far as an oral option that would cover the Pseudomonas which has been noted on bone culture previous and also was noted on the PCR culture we did more recently. Again unfortunately things like Levaquin or Cipro have a interaction with Lexapro causing QT prolongation which is not good. Eileen Stanford unfortunately is not can to be covered by insurance which is the big issue there the doxycycline is not fully treating what we are seeing. T this end I really think that the ideal thing is can be to have the patient  go to the ER for  further o evaluation and treatment of her infection. I discussed that with her today. I recommended the ER at [facility] as a good choice as I am familiar with the infectious disease folks there and I think they do an excellent job. She voiced understanding that seems to be what her and her son are leaning towards going. We will see patient back for reevaluation in 1 week here in the clinic. If anything worsens or changes patient will contact our office for additional recommendations. Electronic Signature(s) Signed: 07/17/2022 1:56:56 PM By: Lenda Kelp PA-C Entered By: Lenda Kelp on 07/17/2022 13:56:56 [name] [name] [name] [name] ([id number]) [id number] Page 9 of 9 -------------------------------------------------------------------------------- SuperBill Details Patient Name: Date of Service: [name] [name] [name] [name] 07/17/2022 Medical Record Number: [id number] Patient Account Number: [id number] Date of Birth/Sex: Treating RN: 1966/08/02 (56 y.o. 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 Modifier: Quantity: 1 Physician Procedures : CPT4 Code Description Modifier [id number] 99214 - WC PHYS LEVEL 4 - EST PT 25 ICD-10 Diagnosis Description E11.621 Type 2 diabetes mellitus with foot ulcer L97.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

## 2022-07-22 DIAGNOSIS — E1143 Type 2 diabetes mellitus with diabetic autonomic (poly)neuropathy: Secondary | ICD-10-CM | POA: Diagnosis not present

## 2022-07-22 NOTE — Progress Notes (Signed)
[name], [name] ([id number]) [id number] Page 1 of 7 Visit Report for 07/17/2022 Arrival Information Details Patient Name: Date of Service: [name], [name] 07/17/2022 10:45 A [name] Medical Record Number: [id number] Patient Account Number: [id number] Date of Birth/Sex: Treating RN: October 05, 1966 (56 y.o. [name], [name] Primary Care [name]: [name] Other Clinician: Referring [name]: Treating [name]/Extender: [name] in Treatment: 5 Visit Information History Since Last Visit Added or deleted any medications: No Patient Arrived: Ambulatory Any new allergies or adverse reactions: No Arrival Time: 10:54 Had a fall or experienced change in No Accompanied By: [name] activities of daily living that may affect Transfer Assistance: None risk of falls: Patient Identification Verified: Yes Signs or symptoms of abuse/neglect since last visito No Secondary Verification Process Completed: Yes Hospitalized since last visit: No Patient Requires Transmission-Based Precautions: No Implantable device outside of the clinic excluding No Patient Has Alerts: No cellular tissue based products placed in the center since last visit: Has Dressing in Place as Prescribed: Yes Has Compression in Place as Prescribed: Yes Pain Present Now: Yes Electronic Signature(s) Signed: 07/22/2022 1:44:39 PM By: [name] Pulling RN, BSN Entered By: [name] on 07/17/2022 10:55:30 -------------------------------------------------------------------------------- Encounter Discharge Information Details Patient Name: Date of Service: [name], [name] 07/17/2022 10:45 A [name] Medical Record Number: [id number] Patient Account Number: [id number] Date of Birth/Sex: Treating RN: 04/16/1966 (56 y.o. [name] Primary Care Latroya Ng: [name] Other Clinician: Referring [name]: Treating [name]: [name] in Treatment: 5 Encounter  Discharge Information Items Post Procedure Vitals Discharge Condition: Stable Temperature (F): 98.0 Ambulatory Status: Ambulatory Pulse (bpm): 120 Discharge Destination: Home Respiratory Rate (breaths/min): 18 Transportation: Private Auto Blood Pressure (mmHg): 133/73 Accompanied By: [name] Schedule Follow-up Appointment: Yes Clinical Summary of Care: Patient Declined Electronic Signature(s) Signed: 07/22/2022 1:44:39 PM By: [name] Pulling RN, BSN Entered By: [name] on 07/17/2022 11:46:12 [name], [name] ([id number]) [id number] Page 2 of 7 -------------------------------------------------------------------------------- Lower Extremity Assessment Details Patient Name: Date of Service: [name], [name] 07/17/2022 10:45 A [name] Medical Record Number: [id number] Patient Account Number: [id number] Date of Birth/Sex: Treating RN: 01/22/1966 (56 y.o. [name] Primary Care Micaiah Litle: [name] Other Clinician: Referring [name]: Treating [name]/Extender: [name] in Treatment: 5 Edema Assessment Assessed: [Left: No] [Right: No] Edema: [Left: Ye] [Right: s] Calf Left: Right: Point of Measurement: 32 cm From Medial Instep 67.7 cm Ankle Left: Right: Point of Measurement: 10 cm From Medial Instep 37.8 cm Vascular Assessment Pulses: Dorsalis Pedis Palpable: [Right:Yes] Electronic Signature(s) Signed: 07/22/2022 1:44:39 PM By: [name] Pulling RN, BSN Entered By: [name] on 07/17/2022 11:02:45 -------------------------------------------------------------------------------- Multi-Disciplinary Care Plan Details Patient Name: Date of Service: [name], [name] 07/17/2022 10:45 A [name] Medical Record Number: [id number] Patient Account Number: [id number] Date of Birth/Sex: Treating RN: 06/13/1966 (56 y.o. [name] Primary Care [name]: [name] Other Clinician: Referring [name]: Treating [name]/Extender: [name] in Treatment: 5 Active Inactive Wound/Skin Impairment Nursing Diagnoses: Impaired tissue integrity Knowledge deficit related to ulceration/compromised skin integrity Goals: Patient will have a decrease in wound volume by X% from date: (specify in notes) Date Initiated: 06/12/2022 Target Resolution Date: 07/13/2022 Goal Status: Active Patient/caregiver will verbalize understanding of skin care regimen Date Initiated: 06/12/2022 Target Resolution Date: 07/13/2022 Goal Status: Active Ulcer/skin breakdown will have a volume reduction of 30% by week 4 [name], [name] ([id number]) [id number] Page 3 of 7 Date Initiated: 06/12/2022 Target Resolution Date: 07/13/2022 Goal Status: Active Ulcer/skin breakdown will have a volume reduction  of 50% by week 8 Date Initiated: 06/12/2022 Target Resolution Date: 07/13/2022 Goal Status: Active Interventions: Assess patient/caregiver ability to obtain necessary supplies Assess patient/caregiver ability to perform ulcer/skin care regimen upon admission and as needed Assess ulceration(s) every visit Notes: Electronic Signature(s) Signed: 07/22/2022 1:44:39 PM By: [name] Pulling RN, BSN Entered By: [name] on 07/17/2022 11:16:50 -------------------------------------------------------------------------------- Pain Assessment Details Patient Name: Date of Service: [name], [name] 07/17/2022 10:45 A [name] Medical Record Number: [id number] Patient Account Number: [id number] Date of Birth/Sex: Treating RN: 1966-03-27 (56 y.o. [name] Primary Care Milt Coye: [name] Other Clinician: Referring Ramonita Koenig: Treating Faun Mcqueen/Extender: [name] in Treatment: 5 Active Problems Location of Pain Severity and Description of Pain Patient Has Paino Yes Site Locations Rate the pain. Current Pain Level: 7 Pain Management and Medication Current Pain Management: Electronic  Signature(s) Signed: 07/22/2022 1:44:39 PM By: [name] Pulling RN, BSN Entered By: [name] on 07/17/2022 10:56:07 Patient/Caregiver Education Details -------------------------------------------------------------------------------- [name] ([id number]) [id number] Page 4 of 7 Patient Name: Date of Service: [name], [name] 10/11/2023andnbsp10:45 A [name] Medical Record Number: [id number] Patient Account Number: [id number] Date of Birth/Gender: Treating RN: 13-Apr-1966 (56 y.o. [name] Primary Care Physician: [name] Other Clinician: Referring Physician: Treating Physician/Extender: [name] in Treatment: 5 Education Assessment Education Provided To: Patient Education Topics Provided Venous: Methods: Explain/Verbal Responses: State content correctly Wound/Skin Impairment: Methods: Explain/Verbal Responses: State content correctly Electronic Signature(s) Signed: 07/22/2022 1:44:39 PM By: [name] Pulling RN, BSN Entered By: [name] on 07/17/2022 11:17:16 -------------------------------------------------------------------------------- Wound Assessment Details Patient Name: Date of Service: [name], [name] 07/17/2022 10:45 A [name] Medical Record Number: [id number] Patient Account Number: [id number] Date of Birth/Sex: Treating RN: [birth year] (56 y.o. [name] Primary Care Christobal Morado: [name] Other Clinician: Referring Hortence Charter: Treating Aisling Emigh/Extender: [name] in Treatment: 5 Wound Status Wound Number: 1 Primary Etiology: Diabetic Wound/Ulcer of the Lower Extremity Wound Location: Right Calcaneus Wound Status: Open Wounding Event: Gradually Appeared Comorbid History: Hypertension, Type II Diabetes, Osteomyelitis Date Acquired: 11/07/2020 Weeks Of Treatment: 5 Clustered Wound: No Photos Wound Measurements Length: (cm) 10.7 Width: (cm) 5 Depth: (cm) 1.1 Area: (cm)  42.019 Volume: (cm) 46.221 Eckerman, [name] ([id number]) Wound Description Classification: Wound Margin: Exudate Amount: Exudate Type: Exudate Color: Grade 3 Distinct, outline attached Medium Serosanguineous red, brown % Reduction in Area: -261.5% % Reduction in Volume: -1888% Epithelialization: None Tunneling: No Undermining: No [id number] Page 5 of 7 Wound Bed Granulation Amount: Large (67-100%) Exposed Structure Granulation Quality: Red, Pink Fascia Exposed: No Necrotic Amount: Small (1-33%) Fat Layer (Subcutaneous Tissue) Exposed: Yes Necrotic Quality: Adherent Slough Tendon Exposed: No Muscle Exposed: No Joint Exposed: No Bone Exposed: No Periwound Skin Texture Texture Color No Abnormalities Noted: No No Abnormalities Noted: No Moisture No Abnormalities Noted: No Maceration: Yes Treatment Notes Wound #1 (Calcaneus) Wound Laterality: Right Cleanser Soap and Water Discharge Instruction: May shower and wash wound with dial antibacterial soap and water prior to dressing change. Wound Cleanser Discharge Instruction: Cleanse the wound with wound cleanser prior to applying a clean dressing using gauze sponges, not tissue or cotton balls. Peri-Wound Care Triamcinolone 15 (g) Discharge Instruction: Use triamcinolone 15 (g) as directed Zinc Oxide Ointment 30g tube Discharge Instruction: Apply Zinc Oxide to periwound with each dressing change Sween Lotion (Moisturizing lotion) Discharge Instruction: Apply moisturizing lotion as directed Topical Primary Dressing Hydrofera Blue Ready Foam, 4x5 in Discharge Instruction: Apply to wound bed as instructed Secondary Dressing ABD Pad, 5x9 Discharge  Instruction: Apply over primary dressing as directed. Optifoam Non-Adhesive Dressing, 4x4 in Discharge Instruction: Make a foam donut and Apply over primary dressing as directed. Woven Gauze Sponge, Non-Sterile 4x4 in Discharge Instruction: Apply over  primary dressing as directed. Secured With Elastic Bandage 4 inch (ACE bandage) Discharge Instruction: Secure with ACE bandage as directed. Kerlix Roll Sterile, 4.5x3.1 (in/yd) Discharge Instruction: Secure with Kerlix as directed. 47M Medipore H Soft Cloth Surgical T ape, 4 x 10 (in/yd) Discharge Instruction: Secure with tape as directed. Compression Wrap Compression Stockings Add-Ons Electronic Signature(s) Appenzeller, Havah ([id number]) [id number] Page 6 of 7 Signed: 07/22/2022 1:44:39 PM By: Sharyn Creamer RN, BSN Entered By: Sharyn Creamer on 07/17/2022 11:10:34 -------------------------------------------------------------------------------- Wound Assessment Details Patient Name: Date of Service: [name], [name] 07/17/2022 10:45 A [name] Medical Record Number: [id number] Patient Account Number: [id number] Date of Birth/Sex: Treating RN: May 04, 1966 (56 y.o. Donalda Ewings Primary Care Merle Cirelli: Stoney Bang Other Clinician: Referring Jakeel Starliper: Treating Yamilet Mcfayden/Extender: Dallas Breeding in Treatment: 5 Wound Status Wound Number: 2 Primary Etiology: Diabetic Wound/Ulcer of the Lower Extremity Wound Location: Right T Second oe Wound Status: Open Wounding Event: Gradually Appeared Comorbid History: Hypertension, Type II Diabetes, Osteomyelitis Date Acquired: 07/17/2022 Weeks Of Treatment: 0 Clustered Wound: No Photos Wound Measurements Length: (cm) 0.7 Width: (cm) 1 Depth: (cm) 0.2 Area: (cm) 0.55 Volume: (cm) 0.11 % Reduction in Area: % Reduction in Volume: Epithelialization: None Tunneling: No Undermining: No Wound Description Classification: Grade 2 Exudate Amount: Medium Exudate Type: Serosanguineous Exudate Color: red, brown Foul Odor After Cleansing: No Slough/Fibrino Yes Wound Bed Granulation Amount: Large (67-100%) Exposed Structure Granulation Quality: Red Fascia Exposed: No Necrotic Amount: Small (1-33%) Fat  Layer (Subcutaneous Tissue) Exposed: Yes Necrotic Quality: Adherent Slough Tendon Exposed: No Muscle Exposed: No Joint Exposed: No Bone Exposed: No Periwound Skin Texture Texture Color No Abnormalities Noted: No No Abnormalities Noted: No Moisture Temperature / Pain No Abnormalities Noted: No Temperature: No Abnormality Maceration: Yes Treatment Notes Fellows, Xanthe ([id number]) [id number] Page 7 of 7 Wound #2 (Toe Second) Wound Laterality: Right Cleanser Peri-Wound Care Topical Primary Dressing Secondary Dressing Secured With Compression Wrap Compression Stockings Add-Ons Electronic Signature(s) Signed: 07/22/2022 1:44:39 PM By: Sharyn Creamer RN, BSN Entered By: Sharyn Creamer on 07/17/2022 11:11:11 -------------------------------------------------------------------------------- [place] Details Patient Name: Date of Service: [name], [name] 07/17/2022 10:45 A [name] Medical Record Number: [id number] Patient Account Number: [id number] Date of Birth/Sex: Treating RN: [birth year] (56 y.o. Donalda Ewings Primary Care Ainslee Sou: Stoney Bang Other Clinician: Referring Shoshana Johal: Treating Reita Shindler/Extender: Dallas Breeding in Treatment: 5 Vital Signs Time Taken: 10:55 Temperature (F): 98.0 Height (in): 65 Pulse (bpm): 120 Weight (lbs): 331 Respiratory Rate (breaths/min): 18 Body Mass Index (BMI): 55.1 Blood Pressure (mmHg): 133/73 Reference Range: 80 - 120 mg / dl Electronic Signature(s) Signed: 07/22/2022 1:44:39 PM By: Sharyn Creamer RN, BSN Entered By: Sharyn Creamer on 07/17/2022 10:55:54

## 2022-07-24 ENCOUNTER — Encounter (HOSPITAL_BASED_OUTPATIENT_CLINIC_OR_DEPARTMENT_OTHER): Payer: BC Managed Care – PPO | Admitting: Internal Medicine

## 2022-07-24 DIAGNOSIS — I890 Lymphedema, not elsewhere classified: Secondary | ICD-10-CM | POA: Diagnosis not present

## 2022-07-24 DIAGNOSIS — E11621 Type 2 diabetes mellitus with foot ulcer: Secondary | ICD-10-CM | POA: Diagnosis not present

## 2022-07-24 DIAGNOSIS — I10 Essential (primary) hypertension: Secondary | ICD-10-CM | POA: Diagnosis not present

## 2022-07-24 DIAGNOSIS — L97512 Non-pressure chronic ulcer of other part of right foot with fat layer exposed: Secondary | ICD-10-CM | POA: Diagnosis not present

## 2022-07-24 DIAGNOSIS — L97412 Non-pressure chronic ulcer of right heel and midfoot with fat layer exposed: Secondary | ICD-10-CM | POA: Diagnosis not present

## 2022-07-24 DIAGNOSIS — E1151 Type 2 diabetes mellitus with diabetic peripheral angiopathy without gangrene: Secondary | ICD-10-CM | POA: Diagnosis not present

## 2022-07-24 DIAGNOSIS — Z89429 Acquired absence of other toe(s), unspecified side: Secondary | ICD-10-CM | POA: Diagnosis not present

## 2022-07-25 ENCOUNTER — Ambulatory Visit: Payer: BC Managed Care – PPO | Admitting: Internal Medicine

## 2022-07-25 ENCOUNTER — Ambulatory Visit: Payer: Self-pay | Admitting: *Deleted

## 2022-07-25 NOTE — Progress Notes (Signed)
[name], [name] Reichmann ([id number]) [id number] Page 1 of 10 Visit Report for 07/24/2022 Arrival Information Details Patient Name: Date of Service: [name], [name] 07/24/2022 11:30 A M Medical Record Number: [id number] Patient Account Number: [id number] Date of Birth/Sex: Treating RN: 17-Mar-1966 (56 y.o. F) Primary [name] [name]: [name] Other Clinician: Referring [name]: Treating [name]/Extender: [name] in Treatment: 6 Visit Information History Since Last Visit Added or deleted any medications: No Patient Arrived: Ambulatory Any new allergies or adverse reactions: No Arrival Time: 11:46 Had a fall or experienced change in No Accompanied By: son activities of daily living that may affect Transfer Assistance: None risk of falls: Patient Identification Verified: Yes Signs or symptoms of abuse/neglect since last visito No Secondary Verification Process Completed: Yes Hospitalized since last visit: No Patient Requires Transmission-Based Precautions: No Implantable device outside of the clinic excluding No Patient Has Alerts: No cellular tissue based products placed in the center since last visit: Has Compression in Place as Prescribed: Yes Pain Present Now: No Electronic Signature(s) Signed: 07/24/2022 4:47:05 PM By: [name] Entered By: [name] on 07/24/2022 11:48:58 -------------------------------------------------------------------------------- Clinic Level of [name] Assessment Details Patient Name: Date of Service: [name], [name] 07/24/2022 11:30 A M Medical Record Number: [id number] Patient Account Number: [id number] Date of Birth/Sex: Treating RN: [birth year] (57 y.o. [name], [name] [name]: [name] Other Clinician: Referring [name]: Treating [name]/Extender: [name] in Treatment: 6 Clinic Level of [name] Assessment Items TOOL 4 Quantity Score X- 1 0 Use  when only an EandM is performed on FOLLOW-UP visit ASSESSMENTS - Nursing Assessment / Reassessment X- 1 10 Reassessment of Co-morbidities (includes updates in patient status) X- 1 5 Reassessment of Adherence to Treatment Plan ASSESSMENTS - Wound and Skin A ssessment / Reassessment X - Simple Wound Assessment / Reassessment - one wound 1 5 []  - 0 Complex Wound Assessment / Reassessment - multiple wounds []  - 0 Dermatologic / Skin Assessment (not related to wound area) ASSESSMENTS - Focused Assessment X- 1 5 Circumferential Edema Measurements - multi extremities []  - 0 Nutritional Assessment / Counseling / Intervention [name], [name] ([id number]) [id number] Page 2 of 10 []  - 0 Lower Extremity Assessment (monofilament, tuning fork, pulses) []  - 0 Peripheral Arterial Disease Assessment (using hand held doppler) ASSESSMENTS - Ostomy and/or Continence Assessment and [name] []  - 0 Incontinence Assessment and Management []  - 0 Ostomy [name] Assessment and Management (repouching, etc.) PROCESS - Coordination of [name] []  - 0 Simple Patient / Family Education for ongoing [name] X- 1 20 Complex (extensive) Patient / Family Education for ongoing [name] X- 1 10 Staff obtains Programmer, systems, Records, T Results / Process Orders est []  - 0 Staff telephones HHA, Nursing Homes / Clarify orders / etc []  - 0 Routine Transfer to another Facility (non-emergent condition) []  - 0 Routine Hospital Admission (non-emergent condition) []  - 0 New Admissions / Biomedical engineer / Ordering NPWT Apligraf, etc. , []  - 0 Emergency Hospital Admission (emergent condition) []  - 0 Simple Discharge Coordination X- 1 15 Complex (extensive) Discharge Coordination PROCESS - Special Needs []  - 0 Pediatric / Minor Patient Management []  - 0 Isolation Patient Management []  - 0 Hearing / Language / Visual special needs []  - 0 Assessment of Community assistance (transportation, D/C planning,  etc.) []  - 0 Additional assistance / Altered mentation []  - 0 Support Surface(s) Assessment (bed, cushion, seat, etc.) INTERVENTIONS - Wound Cleansing / Measurement X - Simple Wound Cleansing - one wound 1 5 []  - 0 Complex  Wound Cleansing - multiple wounds X- 1 5 Wound Imaging (photographs - any number of wounds) []  - 0 Wound Tracing (instead of photographs) X- 1 5 Simple Wound Measurement - one wound []  - 0 Complex Wound Measurement - multiple wounds INTERVENTIONS - Wound Dressings []  - 0 Small Wound Dressing one or multiple wounds X- 1 15 Medium Wound Dressing one or multiple wounds []  - 0 Large Wound Dressing one or multiple wounds X- 1 5 Application of Medications - topical []  - 0 Application of Medications - injection INTERVENTIONS - Miscellaneous []  - 0 External ear exam []  - 0 Specimen Collection (cultures, biopsies, blood, body fluids, etc.) []  - 0 Specimen(s) / Culture(s) sent or taken to Lab for analysis []  - 0 Patient Transfer (multiple staff / Civil Service fast streamer / Similar devices) []  - 0 Simple Staple / Suture removal (25 or less) []  - 0 Complex Staple / Suture removal (26 or more) []  - 0 Hypo / Hyperglycemic Management (close monitor of Blood Glucose) [name], [name] ([id number]) [id number] Page 3 of 10 []  - 0 Ankle / Brachial Index (ABI) - do not check if billed separately X- 1 5 Vital Signs Has the patient been seen at the hospital within the last three years: Yes Total Score: 110 Level Of [name]: New/Established - Level 3 Electronic Signature(s) Signed: 07/25/2022 4:05:37 PM By: [name] Hammock RN Entered By: [name] on 07/24/2022 12:36:16 -------------------------------------------------------------------------------- Encounter Discharge Information Details Patient Name: Date of Service: [name], [name] 07/24/2022 11:30 A M Medical Record Number: [id number] Patient Account Number: [id number] Date of Birth/Sex: Treating  RN: 26-Dec-1965 (56 y.o. [name], [name] Lakendrick Paradis: [name] Other Clinician: Referring [name]: Treating [name]/Extender: [name] in Treatment: 6 Encounter Discharge Information Items Discharge Condition: Stable Ambulatory Status: Ambulatory Discharge Destination: Home Transportation: Private Auto Accompanied By: self Schedule Follow-up Appointment: Yes Clinical Summary of [name]: Patient Declined Electronic Signature(s) Signed: 07/25/2022 4:05:37 PM By: [name] Hammock RN Entered By: [name] on 07/24/2022 12:36:53 -------------------------------------------------------------------------------- Lower Extremity Assessment Details Patient Name: Date of Service: [name], [name] 07/24/2022 11:30 A M Medical Record Number: [id number] Patient Account Number: [id number] Date of Birth/Sex: Treating RN: 02/23/1966 (56 y.o. F) Primary [name] Shuna Tabor: [name] Other Clinician: Referring Coreena Rubalcava: Treating Atsushi Yom/Extender: [name] in Treatment: 6 Edema Assessment Assessed: Shirlyn Goltz: No] [Right: No] Edema: [Left: Ye] [Right: s] Calf Left: Right: Point of Measurement: 32 cm From Medial Instep 62 cm Ankle Left: Right: Point of Measurement: 10 cm From Medial Instep 37 cm Electronic Signature(s) Signed: 07/24/2022 4:47:05 PM By: Kingsley Spittle, Brandie ([id number]) [id number] Page 4 of 10 Signed: 07/24/2022 4:47:05 PM By: [name] Entered By: [name] on 07/24/2022 12:02:04 -------------------------------------------------------------------------------- Multi Wound Chart Details Patient Name: Date of Service: [name], [name] 07/24/2022 11:30 A M Medical Record Number: [id number] Patient Account Number: [id number] Date of Birth/Sex: Treating RN: 1966/02/07 (56 y.o. F) Primary [name] Jya Hughston: [name] Other Clinician: Referring Renuka Farfan: Treating  Ramel Tobon/Extender: [name] in Treatment: 6 Vital Signs Height(in): 65 Pulse(bpm): 118 Weight(lbs): 331 Blood Pressure(mmHg): 134/85 Body Mass Index(BMI): 55.1 Temperature(F): 98.1 Respiratory Rate(breaths/min): 18 [1:Photos:] [N/A:N/A] Right Calcaneus Right T Second oe N/A Wound Location: Gradually Appeared Gradually Appeared N/A Wounding Event: Diabetic Wound/Ulcer of the Lower Diabetic Wound/Ulcer of the Lower N/A Primary Etiology: Extremity Extremity Hypertension, Type II Diabetes, Hypertension, Type II Diabetes, N/A Comorbid History: Osteomyelitis Osteomyelitis 11/07/2020 07/17/2022 N/A Date Acquired: 6 1 N/A Weeks of Treatment: Open Open N/A Wound Status: No No  N/A Wound Recurrence: 9.7x4.8x1 0.4x0.6x0.2 N/A Measurements L x W x D (cm) 36.568 0.188 N/A A (cm) : rea 36.568 0.038 N/A Volume (cm) : -214.60% 65.80% N/A % Reduction in A rea: -1472.80% 65.50% N/A % Reduction in Volume: Grade 3 Grade 2 N/A Classification: Medium Medium N/A Exudate A mount: Serosanguineous Serosanguineous N/A Exudate Type: red, brown red, brown N/A Exudate Color: Distinct, outline attached N/A N/A Wound Margin: Large (67-100%) Large (67-100%) N/A Granulation A mount: Red, Pink Red N/A Granulation Quality: Small (1-33%) Small (1-33%) N/A Necrotic A mount: Fat Layer (Subcutaneous Tissue): Yes Fat Layer (Subcutaneous Tissue): Yes N/A Exposed Structures: Fascia: No Fascia: No Tendon: No Tendon: No Muscle: No Muscle: No Joint: No Joint: No Bone: No Bone: No None None N/A Epithelialization: Excoriation: No Excoriation: No N/A Periwound Skin Texture: Induration: No Induration: No Callus: No Callus: No Crepitus: No Crepitus: No Rash: No Rash: No Scarring: No Scarring: No Maceration: No Dry/Scaly: Yes N/A Periwound Skin Moisture: Dry/Scaly: No Maceration: No Atrophie Blanche: No Atrophie Blanche: No N/A Periwound Skin  Color: Cyanosis: No Cyanosis: No Ecchymosis: No Ecchymosis: No Erythema: No Erythema: No Hemosiderin Staining: No Hemosiderin Staining: No Reinhardt, Kinya ([id number]) [id number] Page 5 of 10 Mottled: No Mottled: No Pallor: No Pallor: No Rubor: No Rubor: No N/A No Abnormality N/A Temperature: Treatment Notes Electronic Signature(s) Signed: 07/24/2022 4:28:13 PM By: Baltazar Najjar MD Entered By: Baltazar Najjar on 07/24/2022 12:22:16 -------------------------------------------------------------------------------- Multi-Disciplinary [name] Plan Details Patient Name: Date of Service: [name], [name] 07/24/2022 11:30 A M Medical Record Number: [id number] Patient Account Number: [id number] Date of Birth/Sex: Treating RN: 1966-05-22 (56 y.o. [name], [name] Belen Zwahlen: [name] Other Clinician: Referring Shontay Wallner: Treating Bular Hickok/Extender: Delora Fuel in Treatment: 6 Active Inactive Wound/Skin Impairment Nursing Diagnoses: Impaired tissue integrity Knowledge deficit related to ulceration/compromised skin integrity Goals: Patient will have a decrease in wound volume by X% from date: (specify in notes) Date Initiated: 06/12/2022 Target Resolution Date: 08/03/2022 Goal Status: Active Patient/caregiver will verbalize understanding of skin [name] regimen Date Initiated: 06/12/2022 Target Resolution Date: 08/03/2022 Goal Status: Active Ulcer/skin breakdown will have a volume reduction of 30% by week 4 Date Initiated: 06/12/2022 Target Resolution Date: 08/03/2022 Goal Status: Active Ulcer/skin breakdown will have a volume reduction of 50% by week 8 Date Initiated: 06/12/2022 Target Resolution Date: 08/03/2022 Goal Status: Active Interventions: Assess patient/caregiver ability to obtain necessary supplies Assess patient/caregiver ability to perform ulcer/skin [name] regimen upon admission and as needed Assess  ulceration(s) every visit Notes: Electronic Signature(s) Signed: 07/25/2022 4:05:37 PM By: Fonnie Mu RN Entered By: Fonnie Mu on 07/24/2022 12:17:37 -------------------------------------------------------------------------------- Pain Assessment Details Patient Name: Date of Service: [name], [name] 07/24/2022 11:30 A Hyman Bower, [name] ([id number]) [id number] Page 6 of 10 Medical Record Number: [id number] Patient Account Number: [id number] Date of Birth/Sex: Treating RN: 29-Jul-1966 (56 y.o. F) Primary [name] Brigido Mera: [name] Other Clinician: Referring Zitlaly Malson: Treating Christifer Chapdelaine/Extender: Delora Fuel in Treatment: 6 Active Problems Location of Pain Severity and Description of Pain Patient Has Paino No Site Locations Pain Management and Medication Current Pain Management: Electronic Signature(s) Signed: 07/24/2022 4:47:05 PM By: Thayer Dallas Entered By: Thayer Dallas on 07/24/2022 11:49:49 -------------------------------------------------------------------------------- Patient/Caregiver Education Details Patient Name: Date of Service: [name] 10/18/2023andnbsp11:30 A M Medical Record Number: [id number] Patient Account Number: [id number] Date of Birth/Gender: Treating RN: April 20, 1966 (56 y.o. [name], [name] Physician: [name] Other Clinician: Referring Physician: Treating Physician/Extender: Delora Fuel in Treatment: 6 Education Assessment Education Provided  To: Patient Education Topics Provided Infection: Methods: Explain/Verbal Responses: State content correctly Electronic Signature(s) Signed: 07/25/2022 4:05:37 PM By: [name] Hammock RN Entered By: [name] on 07/24/2022 12:35:01 Bouie, Vergie ([id number]) [id number] Page 7 of 10 -------------------------------------------------------------------------------- Wound  Assessment Details Patient Name: Date of Service: [name], [name] 07/24/2022 11:30 A M Medical Record Number: [id number] Patient Account Number: [id number] Date of Birth/Sex: Treating RN: 01/17/1966 (56 y.o. F) Primary [name] Makaylia Hewett: [name] Other Clinician: Referring Sharonne Ricketts: Treating Radley Barto/Extender: [name] in Treatment: 6 Wound Status Wound Number: 1 Primary Etiology: Diabetic Wound/Ulcer of the Lower Extremity Wound Location: Right Calcaneus Wound Status: Open Wounding Event: Gradually Appeared Comorbid History: Hypertension, Type II Diabetes, Osteomyelitis Date Acquired: 11/07/2020 Weeks Of Treatment: 6 Clustered Wound: No Photos Wound Measurements Length: (cm) 9.7 Width: (cm) 4.8 Depth: (cm) 1 Area: (cm) 36.568 Volume: (cm) 36.568 % Reduction in Area: -214.6% % Reduction in Volume: -1472.8% Epithelialization: None Tunneling: No Undermining: No Wound Description Classification: Wound Margin: Exudate Amount: Exudate Type: Exudate Color: Grade 3 Distinct, outline attached Medium Serosanguineous red, brown Wound Bed Granulation Amount: Large (67-100%) Exposed Structure Granulation Quality: Red, Pink Fascia Exposed: No Necrotic Amount: Small (1-33%) Fat Layer (Subcutaneous Tissue) Exposed: Yes Necrotic Quality: Adherent Slough Tendon Exposed: No Muscle Exposed: No Joint Exposed: No Bone Exposed: No Periwound Skin Texture Texture Color No Abnormalities Noted: No No Abnormalities Noted: No Callus: No Atrophie Blanche: No Crepitus: No Cyanosis: No Excoriation: No Ecchymosis: No Induration: No Erythema: No Rash: No Hemosiderin Staining: No Scarring: No Mottled: No Pallor: No Moisture Rubor: No No Abnormalities Noted: No Dry / Scaly: No Maceration: No Cooks, Tyrica ([id number]) [id number] Page 8 of 10 Treatment Notes Wound #1 (Calcaneus) Wound Laterality: Right Cleanser Soap and  Water Discharge Instruction: May shower and wash wound with dial antibacterial soap and water prior to dressing change. Wound Cleanser Discharge Instruction: Cleanse the wound with wound cleanser prior to applying a clean dressing using gauze sponges, not tissue or cotton balls. Peri-Wound [name] Triamcinolone 15 (g) Discharge Instruction: Use triamcinolone 15 (g) as directed Zinc Oxide Ointment 30g tube Discharge Instruction: Apply Zinc Oxide to periwound with each dressing change Sween Lotion (Moisturizing lotion) Discharge Instruction: Apply moisturizing lotion as directed Topical Primary Dressing KerraCel Ag Gelling Fiber Dressing, 4x5 in (silver alginate) Discharge Instruction: Apply silver alginate to wound bed as instructed Secondary Dressing ABD Pad, 5x9 Discharge Instruction: Apply over primary dressing as directed. Optifoam Non-Adhesive Dressing, 4x4 in Discharge Instruction: Make a foam donut and Apply over primary dressing as directed. Woven Gauze Sponge, Non-Sterile 4x4 in Discharge Instruction: Apply over primary dressing as directed. Secured With Elastic Bandage 4 inch (ACE bandage) Discharge Instruction: Secure with ACE bandage as directed. Kerlix Roll Sterile, 4.5x3.1 (in/yd) Discharge Instruction: Secure with Kerlix as directed. 70M Medipore H Soft Cloth Surgical T ape, 4 x 10 (in/yd) Discharge Instruction: Secure with tape as directed. Compression Wrap Compression Stockings Add-Ons Electronic Signature(s) Signed: 07/24/2022 4:47:05 PM By: [name] Entered By: [name] on 07/24/2022 12:04:14 -------------------------------------------------------------------------------- Wound Assessment Details Patient Name: Date of Service: [name], [name] 07/24/2022 11:30 A M Medical Record Number: [id number] Patient Account Number: [id number] Date of Birth/Sex: Treating RN: 1965-12-08 (56 y.o. F) Primary [name] Jaimi Belle: [name] Other Clinician: Referring  Anice Wilshire: Treating Lamar Meter/Extender: [name] in Treatment: 6 Wound Status Wound Number: 2 Primary Etiology: Diabetic Wound/Ulcer of the Lower Extremity Wound Location: Right T Second oe Wound Status: Open Wounding Event: Gradually Appeared Comorbid History: Hypertension, Type II Diabetes,  Osteomyelitis Date Acquired: 07/17/2022 AKIA, STREIT ([id number]) [id number] Page 9 of 10 Weeks Of Treatment: 1 Clustered Wound: No Photos Wound Measurements Length: (cm) 0.4 Width: (cm) 0.6 Depth: (cm) 0.2 Area: (cm) 0.188 Volume: (cm) 0.038 % Reduction in Area: 65.8% % Reduction in Volume: 65.5% Epithelialization: None Tunneling: No Undermining: No Wound Description Classification: Grade 2 Exudate Amount: Medium Exudate Type: Serosanguineous Exudate Color: red, brown Foul Odor After Cleansing: No Slough/Fibrino Yes Wound Bed Granulation Amount: Large (67-100%) Exposed Structure Granulation Quality: Red Fascia Exposed: No Necrotic Amount: Small (1-33%) Fat Layer (Subcutaneous Tissue) Exposed: Yes Necrotic Quality: Adherent Slough Tendon Exposed: No Muscle Exposed: No Joint Exposed: No Bone Exposed: No Periwound Skin Texture Texture Color No Abnormalities Noted: No No Abnormalities Noted: No Callus: No Atrophie Blanche: No Crepitus: No Cyanosis: No Excoriation: No Ecchymosis: No Induration: No Erythema: No Rash: No Hemosiderin Staining: No Scarring: No Mottled: No Pallor: No Moisture Rubor: No No Abnormalities Noted: No Dry / Scaly: Yes Temperature / Pain Maceration: No Temperature: No Abnormality Treatment Notes Wound #2 (Toe Second) Wound Laterality: Right Cleanser Soap and Water Discharge Instruction: May shower and wash wound with dial antibacterial soap and water prior to dressing change. Wound Cleanser Discharge Instruction: Cleanse the wound with wound cleanser prior to applying a clean dressing  using gauze sponges, not tissue or cotton balls. Peri-Wound [name] Triamcinolone 15 (g) Discharge Instruction: Use triamcinolone 15 (g) as directed Zinc Oxide Ointment 30g tube Discharge Instruction: Apply Zinc Oxide to periwound with each dressing change Sween Lotion (Moisturizing lotion) Discharge Instruction: Apply moisturizing lotion as directed Topical Hosang, Adrionna ([id number] Page 10 of 10 Primary Dressing KerraCel Ag Gelling Fiber Dressing, 4x5 in (silver alginate) Discharge Instruction: Apply silver alginate to wound bed as instructed Secondary Dressing Woven Gauze Sponge, Non-Sterile 4x4 in Discharge Instruction: Apply over primary dressing as directed. Secured With 30M Medipore H Soft Cloth Surgical T ape, 4 x 10 (in/yd) Discharge Instruction: Secure with tape as directed. Compression Wrap Compression Stockings Add-Ons Electronic Signature(s) Signed: 07/24/2022 4:47:05 PM By: [name] Entered By: [name] on 07/24/2022 12:04:59 -------------------------------------------------------------------------------- Vitals Details Patient Name: Date of Service: [name], [name] 07/24/2022 11:30 A M Medical Record Number: [id number] Patient Account Number: [id number] Date of Birth/Sex: Treating RN: 06-Sep-1966 (56 y.o. F) Primary [name] Que Meneely: [name] Other Clinician: Referring Santos Hardwick: Treating Michaelene Dutan/Extender: [name] in Treatment: 6 Vital Signs Time Taken: 11:49 Temperature (F): 98.1 Height (in): 65 Pulse (bpm): 118 Weight (lbs): 331 Respiratory Rate (breaths/min): 18 Body Mass Index (BMI): 55.1 Blood Pressure (mmHg): 134/85 Reference Range: 80 - 120 mg / dl Electronic Signature(s) Signed: 07/24/2022 4:47:05 PM By: [name] Entered By: [name] on 07/24/2022 11:49:37

## 2022-07-25 NOTE — Progress Notes (Signed)
[name] ([id number]) [id number] Page 1 of 7 Visit Report for 07/24/2022 HPI Details Patient Name: Date of Service: [name], [name] 07/24/2022 11:30 A M Medical Record Number: [id number] Patient Account Number: [id number] Date of Birth/Sex: Treating RN: 1965-12-11 (56 y.o. F) Primary Care Provider: Lia [name] Other Clinician: Referring Provider: Treating Provider/Extender: [name] in Treatment: 6 History of Present Illness HPI Description: 06-13-2022 upon evaluation today patient presents for evaluation of her right heel ulcer. She is having a tremendous amount of pain at this location. She tells me that her most recent hemoglobin A1c was 8.7 and that was on 08-23-2021. This ulcer on the heel has been present she states since February 2023 when she had an injection to the heel by podiatry. She states that she began to have increasing pain the wound opened and has been open ever since. 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 She had an x-ray in June if anything can although there did not appear to be any obvious evidence of osteomyelitis at that point. She subsequently has had OR debridement several times of the wound performed by podiatry. Patient does have a history of lymphedema of the lower extremities she is also a type II diabetic. 06-19-2022 upon evaluation today patient appears to be doing better in regard to the size of her leg which is significantly improved. With that being said she had a lot of drainage from the heel which is completely understandable considering what is going on at this time. There does not appear to be any evidence of active infection there is no warmth or irritation to the leg in general. With that being said the patient does have an issue here with the amount of drainage that is going on I definitely think Zetuvit is good to be the better way to go in place of ABD pads. Also  think that she could potentially benefit from 3 times per week dressing changes but again right now we are going to stick with the to change it today, change in her Friday, and then subsequently seeing where things stand next Wednesday. The other option would be to change her to a different day for wound care having her come then say like a Tuesday Friday or Monday Thursday. 06-26-2022 upon evaluation patient's wound bed actually showed signs of doing well in regard to the overall size it was measuring slightly smaller. With that being said unfortunately the biggest issue we see is she still has a tremendous amount of drainage which is what could keep this from being able to use a total contact cast. For that reason I am did going to discuss with her today the possibility of trying Tubigrip to see if that could be of benefit for her. She voiced understanding and is in agreement with giving this a try. 07-03-2022 upon evaluation today patient unfortunately appears to be doing significantly worse at this point in regard to her swelling due to the fact that she was unable to really wear the Tubigrip. She tells me that it was cutting into her. With that being said she also did have an MRI this MRI revealed that she does have osteomyelitis in the heel this was actually just performed Monday, 25 September. This does show signs of "early osteomyelitis". Nonetheless this is still concerning to me. The wound also appears to be getting deeper in the center aspect of this which has me a little concerned as well. I do believe that she  is likely going require an aggressive approach here to try to get things better. I discussed that in greater detail with her today. I will detailed in the plan. 07-17-2022 upon evaluation today patient appears to be doing poorly in regard to her wound. Unfortunately she does not show any signs of infection systemically though locally this seems to be doing worse with the wound actually  being bigger than where it was previous. She did have an MRI that showed evidence of osteomyelitis actually recommended a referral to ID she was evaluated infectious disease and they recommended referral to podiatry and to be honest have recommended amputation based on what the patient tells me today. With that being said this is something that she is not interested in at all to be perfectly honest. For that reason she wants to know what she should do and where she should go at this point that is where the majority of the conversation which was quite lengthy today went. Obviously understand her concern here but I think she is going to have to definitely get off this and likely this means she is going to need to come out of work. 10/18; Since the patient was the patient is using Hydrofera Blue on her right heel wound. She has a new larger open area superior to the wound. The skin on the bottom of her foot is completely macerated. I reviewed the infectious disease note on this patient from 10/9. They recommended keeping the patient on Delofloxacin 450 twice daily until follow-up tomorrow. The patient states she is not going back there as the only thing they wanted to do was "cut my leg off]. She has not seen podiatry. Her lab works shows a CRP markedly elevated at 240.2 a sedimentation rate of 96 the rest of her CBC is normal creatinine of 0.38 The patient has been to see her primary doctor who is Dr. Leonides SchanzAsanne [SPo] In [place]. According the patient Dr. Loralee [name] has ordered vancomycin and Zosyn to start on November 1. She is not taking the delofloxacin that was suggested by infectious disease. Very angry that they just wanted to consider her for an amputation although I do not specifically see that stated in their note Electronic Signature(s) Signed: 07/24/2022 4:28:13 PM By: Baltazar Najjarobson, [name] Raysor MD Entered By: Baltazar Najjarobson, [name] on 07/24/2022 12:27:51 Beckstrom, Arline AspINDY ([id number])  [id number] Page 2 of 7 -------------------------------------------------------------------------------- Physical Exam Details Patient Name: Date of Service: [name], [name] 07/24/2022 11:30 A M Medical Record Number: [id number] Patient Account Number: [id number] Date of Birth/Sex: Treating RN: 06/25/1966 (56 y.o. F) Primary Care Provider: Lia HoppingHasanaj, Xaje Other Clinician: Referring Provider: Treating Provider/Extender: [name] Fuelobson, Syriana Croslin Hasanaj, Xaje Weeks in Treatment: 6 Cardiovascular Pedal pulses are palpable. Notes Wound exam; the heel wound which was her original wound actually does not look too bad. Deep, but small open area that does not probe to bone. Surrounding skin extremely macerated. The area on her bottom of her foot has extended into the midfoot laterally. The wound area here also looks about the same. There is no surrounding obvious infection Electronic Signature(s) Signed: 07/24/2022 4:28:13 PM By: Baltazar Najjarobson, Taelyr Jantz MD Entered By: Baltazar Najjarobson, Zeke Aker on 07/24/2022 12:30:25 -------------------------------------------------------------------------------- Physician Orders Details Patient Name: Date of Service: [name], [name] 07/24/2022 11:30 A M Medical Record Number: [id number] Patient Account Number: [id number] Date of Birth/Sex: Treating RN: 05/08/1966 (56 y.o. Toniann FailF) Breedlove, [name] Primary Care Provider: Lia HoppingHasanaj, Xaje Other Clinician: Referring Provider: Treating Provider/Extender: [name] Fuelobson, Brodan Grewell Hasanaj, Xaje Weeks in Treatment: 6 Verbal / Phone Orders: No Diagnosis Coding  Follow-up Appointments ppointment in 1 week. - Wednesday w/ Allen Derry and Maryruth Bun Rm # 9 Return A Anesthetic (In clinic) Topical Lidocaine 5% applied to wound bed Bathing/ Shower/ Hygiene May shower with protection but do not get wound dressing(s) wet. Edema Control - Lymphedema / SCD / Other Elevate legs to the level of the heart or above for 30 minutes daily and/or when  sitting, a frequency of: Avoid standing for long periods of time. Off-Loading Open toe surgical shoe to: - w/ peg assist Wound Treatment Wound #1 - Calcaneus Wound Laterality: Right Cleanser: Soap and Water 1 x Per Day/15 Days Discharge Instructions: May shower and wash wound with dial antibacterial soap and water prior to dressing change. Cleanser: Wound Cleanser (Generic) 1 x Per Day/15 Days Discharge Instructions: Cleanse the wound with wound cleanser prior to applying a clean dressing using gauze sponges, not tissue or cotton balls. Peri-Wound Care: Triamcinolone 15 (g) 1 x Per Day/15 Days Discharge Instructions: Use triamcinolone 15 (g) as directed Peri-Wound Care: Zinc Oxide Ointment 30g tube 1 x Per Day/15 Days Discharge Instructions: Apply Zinc Oxide to periwound with each dressing change Peri-Wound Care: Sween Lotion (Moisturizing lotion) 1 x Per Day/15 Days Discharge Instructions: Apply moisturizing lotion as directed Prim Dressing: KerraCel Ag Gelling Fiber Dressing, 4x5 in (silver alginate) ary 1 x Per Day/15 Days Shain, Stephonie ([id number]) [id number] Page 3 of 7 Discharge Instructions: Apply silver alginate to wound bed as instructed Secondary Dressing: ABD Pad, 5x9 (Generic) 1 x Per Day/15 Days Discharge Instructions: Apply over primary dressing as directed. Secondary Dressing: Optifoam Non-Adhesive Dressing, 4x4 in (Generic) 1 x Per Day/15 Days Discharge Instructions: Make a foam donut and Apply over primary dressing as directed. Secondary Dressing: Woven Gauze Sponge, Non-Sterile 4x4 in (Generic) 1 x Per Day/15 Days Discharge Instructions: Apply over primary dressing as directed. Secured With: Elastic Bandage 4 inch (ACE bandage) (Generic) 1 x Per Day/15 Days Discharge Instructions: Secure with ACE bandage as directed. Secured With: [employer], 4.5x3.1 (in/yd) (Generic) 1 x Per Day/15 Days Discharge Instructions: Secure with Kerlix as  directed. Secured With: 55M Medipore H Soft Cloth Surgical T ape, 4 x 10 (in/yd) (Generic) 1 x Per Day/15 Days Discharge Instructions: Secure with tape as directed. Wound #2 - T Second oe Wound Laterality: Right Cleanser: Soap and Water 1 x Per Day/15 Days Discharge Instructions: May shower and wash wound with dial antibacterial soap and water prior to dressing change. Cleanser: Wound Cleanser (Generic) 1 x Per Day/15 Days Discharge Instructions: Cleanse the wound with wound cleanser prior to applying a clean dressing using gauze sponges, not tissue or cotton balls. Peri-Wound Care: Triamcinolone 15 (g) 1 x Per Day/15 Days Discharge Instructions: Use triamcinolone 15 (g) as directed Peri-Wound Care: Zinc Oxide Ointment 30g tube 1 x Per Day/15 Days Discharge Instructions: Apply Zinc Oxide to periwound with each dressing change Peri-Wound Care: Sween Lotion (Moisturizing lotion) 1 x Per Day/15 Days Discharge Instructions: Apply moisturizing lotion as directed Prim Dressing: KerraCel Ag Gelling Fiber Dressing, 4x5 in (silver alginate) 1 x Per Day/15 Days ary Discharge Instructions: Apply silver alginate to wound bed as instructed Secondary Dressing: Woven Gauze Sponge, Non-Sterile 4x4 in (Generic) 1 x Per Day/15 Days Discharge Instructions: Apply over primary dressing as directed. Secured With: 55M Medipore H Soft Cloth Surgical T ape, 4 x 10 (in/yd) (Generic) 1 x Per Day/15 Days Discharge Instructions: Secure with tape as directed. Electronic Signature(s) Signed: 07/24/2022 4:28:13 PM By: Baltazar Najjar MD Signed: 07/25/2022 4:05:37 PM  By: Fonnie Mu RN Entered By: Fonnie Mu on 07/24/2022 12:17:13 -------------------------------------------------------------------------------- Problem List Details Patient Name: Date of Service: DEZIYA, AMERO 07/24/2022 11:30 A M Medical Record Number: [id number] Patient Account Number: [id number] Date of Birth/Sex: Treating RN: 1965/11/28 (56  y.o. F) Primary Care Provider: Lia [name] Other Clinician: Referring Provider: Treating Provider/Extender: [name] in Treatment: 6 Active Problems ICD-10 Encounter Code Description Active Date MDM Diagnosis E11.621 Type 2 diabetes mellitus with foot ulcer 06/12/2022 No Yes Gutknecht, Leea ([id number]) [id number] Page 4 of 7 L97.412 Non-pressure chronic ulcer of right heel and midfoot with fat layer exposed 06/12/2022 No Yes I89.0 Lymphedema, not elsewhere classified 06/12/2022 No Yes Inactive Problems Resolved Problems Electronic Signature(s) Signed: 07/24/2022 4:28:13 PM By: Baltazar Najjar MD Entered By: Baltazar Najjar on 07/24/2022 12:21:59 -------------------------------------------------------------------------------- Progress Note Details Patient Name: Date of Service: [name], [name] 07/24/2022 11:30 A M Medical Record Number: [id number] Patient Account Number: [id number] Date of Birth/Sex: Treating RN: [birth year] (55 y.o. F) Primary Care Provider: Lia [name] Other Clinician: Referring Provider: Treating Provider/Extender: [name] in Treatment: 6 Subjective History of Present Illness (HPI) 06-13-2022 upon evaluation today patient presents for evaluation of her right heel ulcer. She is having a tremendous amount of pain at this location. She tells me that her most recent hemoglobin A1c was 8.7 and that was on 08-23-2021. This ulcer on the heel has been present she states since February 2023 when she had an injection to the heel by podiatry. She states that she began to have increasing pain the wound opened and has been open ever since. 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 She had an x-ray in June if anything can although there did not appear to be any obvious evidence of osteomyelitis at that point. She subsequently has had OR debridement several times of the wound  performed by podiatry. Patient does have a history of lymphedema of the lower extremities she is also a type II diabetic. 06-19-2022 upon evaluation today patient appears to be doing better in regard to the size of her leg which is significantly improved. With that being said she had a lot of drainage from the heel which is completely understandable considering what is going on at this time. There does not appear to be any evidence of active infection there is no warmth or irritation to the leg in general. With that being said the patient does have an issue here with the amount of drainage that is going on I definitely think Zetuvit is good to be the better way to go in place of ABD pads. Also think that she could potentially benefit from 3 times per week dressing changes but again right now we are going to stick with the to change it today, change in her Friday, and then subsequently seeing where things stand next Wednesday. The other option would be to change her to a different day for wound care having her come then say like a Tuesday Friday or Monday Thursday. 06-26-2022 upon evaluation patient's wound bed actually showed signs of doing well in regard to the overall size it was measuring slightly smaller. With that being said unfortunately the biggest issue we see is she still has a tremendous amount of drainage which is what could keep this from being able to use a total contact cast. For that reason I am did going to discuss with her today the possibility of trying Tubigrip to see if that could be of  benefit for her. She voiced understanding and is in agreement with giving this a try. 07-03-2022 upon evaluation today patient unfortunately appears to be doing significantly worse at this point in regard to her swelling due to the fact that she was unable to really wear the Tubigrip. She tells me that it was cutting into her. With that being said she also did have an MRI this MRI revealed that she does  have osteomyelitis in the heel this was actually just performed Monday, 25 September. This does show signs of "early osteomyelitis". Nonetheless this is still concerning to me. The wound also appears to be getting deeper in the center aspect of this which has me a little concerned as well. I do believe that she is likely going require an aggressive approach here to try to get things better. I discussed that in greater detail with her today. I will detailed in the plan. 07-17-2022 upon evaluation today patient appears to be doing poorly in regard to her wound. Unfortunately she does not show any signs of infection systemically though locally this seems to be doing worse with the wound actually being bigger than where it was previous. She did have an MRI that showed evidence of osteomyelitis actually recommended a referral to ID she was evaluated infectious disease and they recommended referral to podiatry and to be honest have recommended amputation based on what the patient tells me today. With that being said this is something that she is not interested in at all to be perfectly honest. For that reason she wants to know what she should do and where she should go at this point that is where the majority of the conversation which was quite lengthy today went. Obviously understand her concern here but I think she is going to have to definitely get off this and likely this means she is going to need to come out of work. 10/18; Since the patient was the patient is using Hydrofera Blue on her right heel wound. She has a new larger open area superior to the wound. The skin on the bottom of her foot is completely macerated. I reviewed the infectious disease note on this patient from 10/9. They recommended keeping the patient on Delofloxacin 450 twice daily until follow-up tomorrow. The patient states she is not going back there as the only thing they wanted to do was "cut my leg off]. She has not seen  podiatry. Her lab works shows a CRP markedly elevated at 240.2 a sedimentation rate of 96 the rest of her CBC is normal creatinine of 0.38 [name], [name] ([id number]) [id number] Page 5 of 7 The patient has been to see her primary doctor who is Dr. Leonides [name] [SPo] In [place]. According the patient Dr. Loralee Pacas has ordered vancomycin and Zosyn to start on November 1. She is not taking the delofloxacin that was suggested by infectious disease. Very angry that they just wanted to consider her for an amputation although I do not specifically see that stated in their note Objective Constitutional Vitals Time Taken: 11:49 AM, Height: 65 in, Weight: 331 lbs, BMI: 55.1, Temperature: 98.1 F, Pulse: 118 bpm, Respiratory Rate: 18 breaths/min, Blood Pressure: 134/85 mmHg. Cardiovascular Pedal pulses are palpable. General Notes: Wound exam; the heel wound which was her original wound actually does not look too bad. Deep, but small open area that does not probe to bone. Surrounding skin extremely macerated. The area on her bottom of her foot has extended into the midfoot laterally. The wound area  here also looks about the same. There is no surrounding obvious infection Integumentary (Hair, Skin) Wound #1 status is Open. Original cause of wound was Gradually Appeared. The date acquired was: 11/07/2020. The wound has been in treatment 6 weeks. The wound is located on the Right Calcaneus. The wound measures 9.7cm length x 4.8cm width x 1cm depth; 36.568cm^2 area and 36.568cm^3 volume. There is Fat Layer (Subcutaneous Tissue) exposed. There is no tunneling or undermining noted. There is a medium amount of serosanguineous drainage noted. The wound margin is distinct with the outline attached to the wound base. There is large (67-100%) red, pink granulation within the wound bed. There is a small (1- 33%) amount of necrotic tissue within the wound bed including Adherent Slough. The periwound skin  appearance did not exhibit: Callus, Crepitus, Excoriation, Induration, Rash, Scarring, Dry/Scaly, Maceration, Atrophie Blanche, Cyanosis, Ecchymosis, Hemosiderin Staining, Mottled, Pallor, Rubor, Erythema. Wound #2 status is Open. Original cause of wound was Gradually Appeared. The date acquired was: 07/17/2022. The wound has been in treatment 1 weeks. The wound is located on the Right T Second. The wound measures 0.4cm length x 0.6cm width x 0.2cm depth; 0.188cm^2 area and 0.038cm^3 volume. There is oe Fat Layer (Subcutaneous Tissue) exposed. There is no tunneling or undermining noted. There is a medium amount of serosanguineous drainage noted. There is large (67-100%) red granulation within the wound bed. There is a small (1-33%) amount of necrotic tissue within the wound bed including Adherent Slough. The periwound skin appearance exhibited: Dry/Scaly. The periwound skin appearance did not exhibit: Callus, Crepitus, Excoriation, Induration, Rash, Scarring, Maceration, Atrophie Blanche, Cyanosis, Ecchymosis, Hemosiderin Staining, Mottled, Pallor, Rubor, Erythema. Periwound temperature was noted as No Abnormality. Assessment Active Problems ICD-10 Type 2 diabetes mellitus with foot ulcer Non-pressure chronic ulcer of right heel and midfoot with fat layer exposed Lymphedema, not elsewhere classified Plan Follow-up Appointments: Return Appointment in 1 week. - Wednesday w/ Jeri Cos and Allayne Butcher Rm # 9 Anesthetic: (In clinic) Topical Lidocaine 5% applied to wound bed Bathing/ Shower/ Hygiene: May shower with protection but do not get wound dressing(s) wet. Edema Control - Lymphedema / SCD / Other: Elevate legs to the level of the heart or above for 30 minutes daily and/or when sitting, a frequency of: Avoid standing for long periods of time. Off-Loading: Open toe surgical shoe to: - w/ peg assist WOUND #1: - Calcaneus Wound Laterality: Right Cleanser: Soap and Water 1 x Per YKD/98  Days Discharge Instructions: May shower and wash wound with dial antibacterial soap and water prior to dressing change. Cleanser: Wound Cleanser (Generic) 1 x Per Day/15 Days Discharge Instructions: Cleanse the wound with wound cleanser prior to applying a clean dressing using gauze sponges, not tissue or cotton balls. Peri-Wound Care: Triamcinolone 15 (g) 1 x Per Day/15 Days Discharge Instructions: Use triamcinolone 15 (g) as directed Peri-Wound Care: Zinc Oxide Ointment 30g tube 1 x Per Day/15 Days Discharge Instructions: Apply Zinc Oxide to periwound with each dressing change Peri-Wound Care: Sween Lotion (Moisturizing lotion) 1 x Per PJA/25 Days Discharge Instructions: Apply moisturizing lotion as directed Prim Dressing: KerraCel Ag Gelling Fiber Dressing, 4x5 in (silver alginate) 1 x Per KNL/97 Days ary Discharge Instructions: Apply silver alginate to wound bed as instructed Secondary Dressing: ABD Pad, 5x9 (Generic) 1 x Per Day/15 Days [name], [name] ([id number]) [id number] Page 6 of 7 Discharge Instructions: Apply over primary dressing as directed. Secondary Dressing: Optifoam Non-Adhesive Dressing, 4x4 in (Generic) 1 x Per Day/15 Days Discharge Instructions: Make a  foam donut and Apply over primary dressing as directed. Secondary Dressing: Woven Gauze Sponge, Non-Sterile 4x4 in (Generic) 1 x Per Day/15 Days Discharge Instructions: Apply over primary dressing as directed. Secured With: Elastic Bandage 4 inch (ACE bandage) (Generic) 1 x Per Day/15 Days Discharge Instructions: Secure with ACE bandage as directed. Secured With: [employer], 4.5x3.1 (in/yd) (Generic) 1 x Per Day/15 Days Discharge Instructions: Secure with Kerlix as directed. Secured With: 40M Medipore H Soft Cloth Surgical T ape, 4 x 10 (in/yd) (Generic) 1 x Per Day/15 Days Discharge Instructions: Secure with tape as directed. WOUND #2: - T Second Wound Laterality: Right oe Cleanser:  Soap and Water 1 x Per Day/15 Days Discharge Instructions: May shower and wash wound with dial antibacterial soap and water prior to dressing change. Cleanser: Wound Cleanser (Generic) 1 x Per Day/15 Days Discharge Instructions: Cleanse the wound with wound cleanser prior to applying a clean dressing using gauze sponges, not tissue or cotton balls. Peri-Wound Care: Triamcinolone 15 (g) 1 x Per Day/15 Days Discharge Instructions: Use triamcinolone 15 (g) as directed Peri-Wound Care: Zinc Oxide Ointment 30g tube 1 x Per Day/15 Days Discharge Instructions: Apply Zinc Oxide to periwound with each dressing change Peri-Wound Care: Sween Lotion (Moisturizing lotion) 1 x Per Day/15 Days Discharge Instructions: Apply moisturizing lotion as directed Prim Dressing: KerraCel Ag Gelling Fiber Dressing, 4x5 in (silver alginate) 1 x Per Day/15 Days ary Discharge Instructions: Apply silver alginate to wound bed as instructed Secondary Dressing: Woven Gauze Sponge, Non-Sterile 4x4 in (Generic) 1 x Per Day/15 Days Discharge Instructions: Apply over primary dressing as directed. Secured With: 40M Medipore H Soft Cloth Surgical T ape, 4 x 10 (in/yd) (Generic) 1 x Per Day/15 Days Discharge Instructions: Secure with tape as directed. 1. I discussed the patient's situation with her. She needs antibiotics and I do not disagree with Vanco and Zosyn suggested by her primary doctor. I am a bit dismayed that they are only going to start this in 13 days nevertheless nothing looks that active and the patient at the present in fact the wound on her heel actually looks somewhat better with no probing areas. 2. There is obviously too much drainage here coming out of this wound coalescing on the bottom of her put foot. She comes into the clinic in a running shoe. She says she is offloading this but I doubt this is adequate. 3. I have counseled the patient to talk to Dr. Graciela Husbands in [place] about trying to expedient the IV antibiotics  otherwise probably the oral antibiotics suggested by Dr. Thedore Mins of infectious disease would be reasonable. #4 I have changed the primary dressing to silver alginate zetuvit change daily 5. I doubt she is offloading this adequately. 6. They are going to call their primary doctor in [place] to see if the IV antibiotics can be [employer] aided. If not I be glad to order the delofloxacin Electronic Signature(s) Signed: 07/24/2022 4:28:13 PM By: Baltazar Najjar MD Entered By: Baltazar Najjar on 07/24/2022 12:34:46 -------------------------------------------------------------------------------- SuperBill Details Patient Name: Date of Service: [name], [name] 07/24/2022 Medical Record Number: [id number] Patient Account Number: [id number] Date of Birth/Sex: Treating RN: 12/05/1965 (56 y.o. F) Primary Care Provider: Lia [name] Other Clinician: Referring Provider: Treating Provider/Extender: [name] in Treatment: 6 Diagnosis Coding ICD-10 Codes Code Description [phone] Type 2 diabetes mellitus with foot ulcer L97.412 Non-pressure chronic ulcer of right heel and midfoot with fat layer exposed I89.0 Lymphedema, not elsewhere classified Facility Procedures : CPT4 Code: [id number]  Description: 99213 - WOUND CARE VISIT-LEV 3 EST PT Modifier: Quantity: 1 Physician Procedures : CPT4 Code Description Modifier [id number] 99214 - WC PHYS LEVEL 4 - EST PT [name], [name] ([id number]) [id number] Quantity: 1 Page 7 of 7 : ICD-10 Diagnosis Description E11.621 Type 2 diabetes mellitus with foot ulcer L97.412 Non-pressure chronic ulcer of right heel and midfoot with fat layer exposed Quantity: Electronic Signature(s) Signed: 07/24/2022 4:28:13 PM By: Baltazar Najjar MD Signed: 07/25/2022 4:05:37 PM By: Fonnie Mu RN Entered By: Fonnie Mu on 07/24/2022 12:36:21

## 2022-07-25 NOTE — Patient Outreach (Signed)
  Care Coordination   07/25/2022 Name: [name] MRN: [id number] DOB: Jul 15, 1966   Care Coordination Outreach Attempts:  An unsuccessful telephone outreach was attempted for a scheduled appointment today.  Follow Up Plan:  Additional outreach attempts will be made to offer the patient care coordination information and services.   Encounter Outcome:  No Answer  Care Coordination Interventions Activated:  No   Care Coordination Interventions:  No, not indicated    Valente David, RN, MSN, [facility] Care Management Care Management Coordinator [phone]

## 2022-07-25 NOTE — Patient Instructions (Signed)
Visit Information  Thank you for taking time to visit with me today. Please don't hesitate to contact me if I can be of assistance to you before our next scheduled telephone appointment.  Following are the goals we discussed today:  Continue daily dressing changes.  Call PCP office to follow up on order for [place] next appointment is by telephone on 11/16  Please call the care guide team at [phone] if you need to cancel or reschedule your appointment.   Please call the Suicide and Crisis Lifeline: 988 call the [place] National Suicide Prevention Lifeline: 408-062-8201 or TTY: 606-729-9549 TTY 337-841-9834) to talk to a trained counselor call 1-800-273-TALK (toll free, 24 hour hotline) call the [facility]: [phone] if you are experiencing a [place] or [place] or need someone to talk to.  The patient verbalized understanding of instructions, educational materials, and care plan provided today and agreed to receive a mailed copy of patient instructions, educational materials, and care plan.   The patient has been provided with contact information for the care management team and has been advised to call with any health related questions or concerns.   Valente David, RN, MSN, [place] Care Management Care Management Coordinator [phone]

## 2022-07-25 NOTE — Patient Outreach (Signed)
  Care Coordination   Follow Up Visit Note   07/25/2022 Name: Shakeita Vandevander MRN: [id number] DOB: May 11, 1966  Camilia Caywood is a 56 y.o. year old female who sees Hasanaj, Samul Dada, MD for primary care. I spoke with  Sabino Snipes by phone today.  What matters to the patients health and wellness today?  Report A1C has decreased slightly (now 8.8) but continues to work on decreasing more.  She is doing  dressing changes to foot wound daily, and she will do home IV antibiotic therapy once PICC line is placed.  Denies any urgent concerns, encouraged to contact this care manager with questions.      Goals Addressed               This Visit's Progress     Decrease A1C (currently 9.6) (pt-stated)   On track     Care Coordination Interventions: Provided education to patient about basic DM disease process Reviewed medications with patient and discussed importance of medication adherence Discussed plans with patient for ongoing care management follow up and provided patient with direct contact information for care management team Reviewed scheduled/upcoming provider appointments including: PICC line insertion on 11/1, weekly visits with the wound care center (next one 10/25, foot surgeon on 10/24) Advised patient, providing education and rationale, to check cbg at least daily and record, calling PCP for findings outside established parameters Assessed social determinant of health barriers Discussed use of Ozempic for DM management, insurance looking into for authorization        SDOH assessments and interventions completed:  No     Care Coordination Interventions Activated:  Yes  Care Coordination Interventions:  Yes, provided   Follow up plan: Follow up call scheduled for 11/16    Encounter Outcome:  Pt. Visit Completed   Valente David, RN, MSN, [place] Care Management Care Management Coordinator [phone]

## 2022-07-30 ENCOUNTER — Ambulatory Visit: Payer: BC Managed Care – PPO | Admitting: General Surgery

## 2022-07-31 ENCOUNTER — Encounter (HOSPITAL_BASED_OUTPATIENT_CLINIC_OR_DEPARTMENT_OTHER): Payer: BC Managed Care – PPO | Admitting: Physician Assistant

## 2022-07-31 DIAGNOSIS — L97412 Non-pressure chronic ulcer of right heel and midfoot with fat layer exposed: Secondary | ICD-10-CM | POA: Diagnosis not present

## 2022-07-31 DIAGNOSIS — I890 Lymphedema, not elsewhere classified: Secondary | ICD-10-CM | POA: Diagnosis not present

## 2022-07-31 DIAGNOSIS — E11621 Type 2 diabetes mellitus with foot ulcer: Secondary | ICD-10-CM | POA: Diagnosis not present

## 2022-07-31 DIAGNOSIS — Z89429 Acquired absence of other toe(s), unspecified side: Secondary | ICD-10-CM | POA: Diagnosis not present

## 2022-07-31 DIAGNOSIS — L97512 Non-pressure chronic ulcer of other part of right foot with fat layer exposed: Secondary | ICD-10-CM | POA: Diagnosis not present

## 2022-07-31 DIAGNOSIS — I10 Essential (primary) hypertension: Secondary | ICD-10-CM | POA: Diagnosis not present

## 2022-07-31 DIAGNOSIS — E1151 Type 2 diabetes mellitus with diabetic peripheral angiopathy without gangrene: Secondary | ICD-10-CM | POA: Diagnosis not present

## 2022-07-31 NOTE — Progress Notes (Signed)
Economos, [name] ([id number]) [id number] Page 1 of 10 Visit Report for 07/31/2022 Chief Complaint Document Details Patient Name: Date of Service: [name], [name] 07/31/2022 8:00 A M Medical Record Number: [id number] Patient Account Number: [id number] Date of Birth/Sex: Treating RN: 1966/08/23 (56 y.o. F) Primary Care Provider: Stoney Bang Other Clinician: Referring Provider: Treating Provider/Extender: Dallas Breeding in Treatment: 7 Information Obtained from: Patient Chief Complaint Right heel ulcer Electronic Signature(s) Signed: 07/31/2022 8:20:11 AM By: Worthy Keeler PA-C Entered By: Worthy Keeler on 07/31/2022 08:20:11 -------------------------------------------------------------------------------- Debridement Details Patient Name: Date of Service: [name] 07/31/2022 8:00 A M Medical Record Number: [id number] Patient Account Number: [id number] Date of Birth/Sex: Treating RN: August 04, 1966 (56 y.o. Helene Shoe, Meta.Reding Primary Care Provider: Stoney Bang Other Clinician: Referring Provider: Treating Provider/Extender: Dallas Breeding in Treatment: 7 Debridement Performed for Assessment: Wound #2 Right T Second oe Performed By: Physician Worthy Keeler, PA Debridement Type: Debridement Severity of Tissue Pre Debridement: Fat layer exposed Level of Consciousness (Pre-procedure): Awake and Alert Pre-procedure Verification/Time Out Yes - 08:50 Taken: Start Time: 08:51 Pain Control: Lidocaine 5% topical ointment T Area Debrided (L x W): otal 0.8 (cm) x 0.8 (cm) = 0.64 (cm) Tissue and other material debrided: Viable, Non-Viable, Callus, Slough, Subcutaneous, Skin: Dermis , Skin: Epidermis, Slough Level: Skin/Subcutaneous Tissue Debridement Description: Excisional Instrument: Curette Bleeding: None Hemostasis Achieved: Pressure End Time: 08:58 Procedural Pain: 0 Post Procedural Pain: 0 Response to  Treatment: Procedure was tolerated well Level of Consciousness (Post- Awake and Alert procedure): Post Debridement Measurements of Total Wound Length: (cm) 0.4 Width: (cm) 0.8 Depth: (cm) 0.2 Volume: (cm) 0.05 Character of Wound/Ulcer Post Debridement: Improved Severity of Tissue Post Debridement: Fat layer exposed [name], [name] ([id number]) [id number] Page 2 of 10 Post Procedure Diagnosis Same as Pre-procedure Electronic Signature(s) Signed: 07/31/2022 6:42:42 PM By: Worthy Keeler PA-C Signed: 07/31/2022 6:57:47 PM By: Deon Pilling RN, BSN Entered By: Deon Pilling on 07/31/2022 08:59:16 -------------------------------------------------------------------------------- Debridement Details Patient Name: Date of Service: [name] 07/31/2022 8:00 A M Medical Record Number: [id number] Patient Account Number: [id number] Date of Birth/Sex: Treating RN: Mar 05, 1966 (56 y.o. Helene Shoe, Meta.Reding Primary Care Provider: Stoney Bang Other Clinician: Referring Provider: Treating Provider/Extender: Dallas Breeding in Treatment: 7 Debridement Performed for Assessment: Wound #1 Right Calcaneus Performed By: Physician Worthy Keeler, PA Debridement Type: Debridement Severity of Tissue Pre Debridement: Fat layer exposed Level of Consciousness (Pre-procedure): Awake and Alert Pre-procedure Verification/Time Out Yes - 08:50 Taken: Start Time: 08:51 Pain Control: Lidocaine 5% topical ointment T Area Debrided (L x W): otal 9 (cm) x 2 (cm) = 18 (cm) Tissue and other material debrided: Viable, Non-Viable, Callus, Slough, Subcutaneous, Skin: Dermis , Skin: Epidermis, Slough Level: Skin/Subcutaneous Tissue Debridement Description: Excisional Instrument: Curette Bleeding: None Hemostasis Achieved: Pressure End Time: 08:58 Procedural Pain: 0 Post Procedural Pain: 0 Response to Treatment: Procedure was tolerated well Level of Consciousness  (Post- Awake and Alert procedure): Post Debridement Measurements of Total Wound Length: (cm) 9 Width: (cm) 2 Depth: (cm) 1 Volume: (cm) 14.137 Character of Wound/Ulcer Post Debridement: Improved Severity of Tissue Post Debridement: Fat layer exposed Post Procedure Diagnosis Same as Pre-procedure Notes Documented by Deon Pilling, RN. Electronic Signature(s) Signed: 07/31/2022 6:42:42 PM By: Worthy Keeler PA-C Signed: 07/31/2022 6:57:47 PM By: Deon Pilling RN, BSN Entered By: Deon Pilling on 07/31/2022 09:08:30 [name], [name] ([id number]) [id number] Page 3 of 10 -------------------------------------------------------------------------------- HPI Details Patient Name: Date of Service: [name], [name]  07/31/2022 8:00 A M Medical Record Number: [id number] Patient Account Number: [id number] Date of Birth/Sex: Treating RN: October 05, 1966 (56 y.o. F) Primary Care Provider: Stoney Bang Other Clinician: Referring Provider: Treating Provider/Extender: Dallas Breeding in Treatment: 7 History of Present Illness HPI Description: 06-13-2022 upon evaluation today patient presents for evaluation of her right heel ulcer. She is having a tremendous amount of pain at this location. She tells me that her most recent hemoglobin A1c was 8.7 and that was on 08-23-2021. This ulcer on the heel has been present she states since February 2023 when she had an injection to the heel by podiatry. She states that she began to have increasing pain the wound opened and has been open ever since. 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 She had an x-ray in June if anything can although there did not appear to be any obvious evidence of osteomyelitis at that point. She subsequently has had OR debridement several times of the wound performed by podiatry. Patient does have a history of lymphedema of the lower extremities she is also a type II  diabetic. 06-19-2022 upon evaluation today patient appears to be doing better in regard to the size of her leg which is significantly improved. With that being said she had a lot of drainage from the heel which is completely understandable considering what is going on at this time. There does not appear to be any evidence of active infection there is no warmth or irritation to the leg in general. With that being said the patient does have an issue here with the amount of drainage that is going on I definitely think Zetuvit is good to be the better way to go in place of ABD pads. Also think that she could potentially benefit from 3 times per week dressing changes but again right now we are going to stick with the to change it today, change in her Friday, and then subsequently seeing where things stand next Wednesday. The other option would be to change her to a different day for wound care having her come then say like a Tuesday Friday or Monday Thursday. 06-26-2022 upon evaluation patient's wound bed actually showed signs of doing well in regard to the overall size it was measuring slightly smaller. With that being said unfortunately the biggest issue we see is she still has a tremendous amount of drainage which is what could keep this from being able to use a total contact cast. For that reason I am did going to discuss with her today the possibility of trying Tubigrip to see if that could be of benefit for her. She voiced understanding and is in agreement with giving this a try. 07-03-2022 upon evaluation today patient unfortunately appears to be doing significantly worse at this point in regard to her swelling due to the fact that she was unable to really wear the Tubigrip. She tells me that it was cutting into her. With that being said she also did have an MRI this MRI revealed that she does have osteomyelitis in the heel this was actually just performed Monday, 25 September. This does show signs of  "early osteomyelitis". Nonetheless this is still concerning to me. The wound also appears to be getting deeper in the center aspect of this which has me a little concerned as well. I do believe that she is likely going require an aggressive approach here to try to get things better. I discussed that in greater  detail with her today. I will detailed in the plan. 07-17-2022 upon evaluation today patient appears to be doing poorly in regard to her wound. Unfortunately she does not show any signs of infection systemically though locally this seems to be doing worse with the wound actually being bigger than where it was previous. She did have an MRI that showed evidence of osteomyelitis actually recommended a referral to ID she was evaluated infectious disease and they recommended referral to podiatry and to be honest have recommended amputation based on what the patient tells me today. With that being said this is something that she is not interested in at all to be perfectly honest. For that reason she wants to know what she should do and where she should go at this point that is where the majority of the conversation which was quite lengthy today went. Obviously understand her concern here but I think she is going to have to definitely get off this and likely this means she is going to need to come out of work. 10/18; Since the patient was the patient is using Hydrofera Blue on her right heel wound. She has a new larger open area superior to the wound. The skin on the bottom of her foot is completely macerated. I reviewed the infectious disease note on this patient from 10/9. They recommended keeping the patient on Delofloxacin 450 twice daily until follow-up tomorrow. The patient states she is not going back there as the only thing they wanted to do was "cut my leg off]. She has not seen podiatry. Her lab works shows a CRP markedly elevated at 240.2 a sedimentation rate of 96 the rest of her CBC is  normal creatinine of 0.38 The patient has been to see her primary doctor who is Dr. Melonie [place] [SP[facility]] In [place]. According the patient Dr. Mylinda Latina has ordered vancomycin and Zosyn to start on November 1. She is not taking the delofloxacin that was suggested by infectious disease. Very angry that they just wanted to consider her for an amputation although I do not specifically see that stated in their note 07-31-2022 upon evaluation today patient still has a significant wound over her plantar aspect of her right l foot region. With that being said I am definitely concerned about the fact that the patient probably does need to be in hyperbaric oxygen therapy at this point. I think we should try to get this going as quickly as possible. She actually is going to be set up with a PICC line next Wednesday and then following this we will have the vancomycin and Zosyn that she will be taking for 8 weeks according to what she tells me. I think this is definitely going to be beneficial and again the goal here is limb salvage. In combination with that I think the hyperbaric oxygen therapy would be ultimately significantly helpful for her. Electronic Signature(s) Signed: 07/31/2022 10:37:16 AM By: Worthy Keeler PA-C Entered By: Worthy Keeler on 07/31/2022 10:37:16 -------------------------------------------------------------------------------- Physical Exam Details Patient Name: Date of Service: [name], [name] 07/31/2022 8:00 A M Medical Record Number: [id number] Patient Account Number: [id number] Date of Birth/Sex: Treating RN: 08-14-1966 (56 y.o. F) Primary Care Provider: Stoney Bang Other ClinicianSabino Snipes ([id number]) [id number] Page 4 of 10 Referring Provider: Treating Provider/Extender: Dallas Breeding in Treatment: 7 Constitutional Obese and well-hydrated in no acute distress. Respiratory normal breathing without difficulty. Psychiatric this  patient is able to make decisions and demonstrates good insight into disease  process. Alert and Oriented x 3. pleasant and cooperative. Notes Upon inspection patient's wound bed actually showed signs of still having a tremendous amount of drainage I do believe this is better than when I saw her last and they are changing this much more frequently as far as the dressing change timeframe in order to try to keep this under control as much as possible. I think that still something this would be good to do ongoing. I did perform some sharp debridement today to clearway some of the necrotic debris is much as I could which she tolerated without complication. Postdebridement the wound bed actually appears to be better this was a minimal debridement really around the edges of the wound most significantly. With that being said I do believe nonetheless that she is going to need ongoing aggressive care in order to get this to heal. Electronic Signature(s) Signed: 07/31/2022 10:38:36 AM By: Worthy Keeler PA-C Entered By: Worthy Keeler on 07/31/2022 10:38:36 -------------------------------------------------------------------------------- Physician Orders Details Patient Name: Date of Service: [name], [name] 07/31/2022 8:00 A M Medical Record Number: [id number] Patient Account Number: [id number] Date of Birth/Sex: Treating RN: 08-15-1966 (56 y.o. 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 - Wednesday w/ Jeri Cos Return A Other: - Ensure you continue oral antibiotics until PICC line  place, then start and finish all IV antibiotics- Vancomycin and Zosyn (ordered primary care provider). Consider Hyberbaric Oxygen Therapy. Anesthetic (In clinic) Topical Lidocaine 5% applied to wound bed Bathing/ Shower/ Hygiene May shower with protection but do not get wound dressing(s) wet. Edema Control - Lymphedema / SCD / Other Elevate legs to the level of the heart or above for 30 minutes daily and/or when sitting, a frequency of: - 3-4 times a day throughout the day. Avoid standing for long periods of time. Off-Loading Open toe surgical shoe to: - w/ peg assist Other: - Minimize walking and standing as much as possible to right foot to aid in offloading to wound. While at work use the wheelchair for mobility. Hyperbaric Oxygen Therapy Wound #1 Right Calcaneus Evaluate for HBO Therapy Indication: - Wagner Grade 3 to right calcaneus If appropriate for treatment, begin HBOT per protocol: Schedler, Nella ([id number]) [id number] Page 5 of 10 2.5 ATA for 90 Minutes with 2 Five (5) Minute A Breaks ir Total Number of Treatments: - 40 One treatments per day (delivered Monday through Friday unless otherwise specified in Special Instructions below): Finger stick Blood Glucose Pre- and Post- HBOT Treatment. Follow Hyperbaric Oxygen Glycemia Protocol Afrin (Oxymetazoline HCL) 0.05% nasal spray - 1 spray in both nostrils daily as needed prior to HBO treatment for difficulty clearing ears Wound Treatment Wound #1 - Calcaneus Wound Laterality: Right Cleanser: Soap and Water 1 x Per Day/15 Days Discharge Instructions: May shower and wash wound with dial antibacterial soap and water prior to dressing change. Cleanser: Wound Cleanser (Generic) 1 x Per Day/15 Days Discharge Instructions: Cleanse the wound with wound cleanser prior to applying a clean dressing using gauze sponges, not tissue or cotton balls. Peri-Wound Care: Triamcinolone 15 (g) 1 x Per Day/15  Days Discharge Instructions: Use triamcinolone 15 (g) as directed Peri-Wound Care: Zinc  Oxide Ointment 30g tube 1 x Per Day/15 Days Discharge Instructions: Apply Zinc Oxide to periwound with each dressing change Peri-Wound Care: Sween Lotion (Moisturizing lotion) 1 x Per Day/15 Days Discharge Instructions: Apply moisturizing lotion as directed Prim Dressing: KerraCel Ag Gelling Fiber Dressing, 4x5 in (silver alginate) 1 x Per Day/15 Days ary Discharge Instructions: Apply silver alginate to wound bed as instructed Secondary Dressing: ABD Pad, 5x9 (Generic) 1 x Per Day/15 Days Discharge Instructions: Apply over primary dressing as directed. Secondary Dressing: Drawtex 4x4 in 1 x Per Day/15 Days Discharge Instructions: Apply over primary dressing as directed. Secondary Dressing: Woven Gauze Sponge, Non-Sterile 4x4 in (Generic) 1 x Per Day/15 Days Discharge Instructions: Apply over primary dressing as directed. Secured With: Elastic Bandage 4 inch (ACE bandage) (Generic) 1 x Per Day/15 Days Discharge Instructions: Secure with ACE bandage as directed. Secured With: [employer], 4.5x3.1 (in/yd) (Generic) 1 x Per Day/15 Days Discharge Instructions: Secure with Kerlix as directed. Secured With: 29M Medipore H Soft Cloth Surgical T ape, 4 x 10 (in/yd) (Generic) 1 x Per Day/15 Days Discharge Instructions: Secure with tape as directed. Wound #2 - T Second oe Wound Laterality: Right Cleanser: Soap and Water 1 x Per Day/15 Days Discharge Instructions: May shower and wash wound with dial antibacterial soap and water prior to dressing change. Cleanser: Wound Cleanser (Generic) 1 x Per Day/15 Days Discharge Instructions: Cleanse the wound with wound cleanser prior to applying a clean dressing using gauze sponges, not tissue or cotton balls. Peri-Wound Care: Triamcinolone 15 (g) 1 x Per Day/15 Days Discharge Instructions: Use triamcinolone 15 (g) as directed Peri-Wound Care: Zinc Oxide Ointment  30g tube 1 x Per Day/15 Days Discharge Instructions: Apply Zinc Oxide to periwound with each dressing change Peri-Wound Care: Sween Lotion (Moisturizing lotion) 1 x Per Day/15 Days Discharge Instructions: Apply moisturizing lotion as directed Prim Dressing: KerraCel Ag Gelling Fiber Dressing, 4x5 in (silver alginate) 1 x Per Day/15 Days ary Discharge Instructions: Apply silver alginate to wound bed as instructed Secondary Dressing: Woven Gauze Sponge, Non-Sterile 4x4 in (Generic) 1 x Per Day/15 Days Discharge Instructions: Apply over primary dressing as directed. Secured With: 29M Medipore H Soft Cloth Surgical T ape, 4 x 10 (in/yd) (Generic) 1 x Per Day/15 Days Discharge Instructions: Secure with tape as directed. GLYCEMIA INTERVENTIONS PROTOCOL PRE-HBO GLYCEMIA INTERVENTIONS ACTION INTERVENTION Obtain pre-HBO capillary blood glucose (ensure 1 physician order is in chart). A. Notify HBO physician and await physician [name], [name] ([id number]) [id number] Page 6 of 10 orders. 2 If result is 70 mg/dl or below: B. If the result meets the hospital definition of a critical result, follow hospital policy. A. Give patient an 8 ounce Glucerna Shake, an 8 ounce Ensure, or 8 ounces of a Glucerna/Ensure equivalent dietary supplement*. B. Wait 30 minutes. If result is 71 mg/dl to 130 mg/dl: C. Retest patients capillary blood glucose (CBG). D. If result greater than or equal to 110 mg/dl, proceed with HBO. If result less than 110 mg/dl, notify HBO physician and consider holding HBO. If result is 131 mg/dl to 249 mg/dl: A. Proceed with HBO. A. Notify HBO physician and await physician orders. B. It is recommended to hold HBO and do If result is 250 mg/dl or greater: blood/urine ketone testing. C. If the result meets the hospital definition of a critical result, follow hospital policy. POST-HBO GLYCEMIA INTERVENTIONS ACTION INTERVENTION Obtain post HBO  capillary blood glucose (ensure 1 physician order is in chart). A. Notify HBO physician and await physician  orders. 2 If result is 70 mg/dl or below: B. If the result meets the hospital definition of a critical result, follow hospital policy. A. Give patient an 8 ounce Glucerna Shake, an 8 ounce Ensure, or 8 ounces of a Glucerna/Ensure equivalent dietary supplement*. B. Wait 15 minutes for symptoms of If result is 71 mg/dl to 100 mg/dl: hypoglycemia (i.e. nervousness, anxiety, sweating, chills, clamminess, irritability, confusion, tachycardia or dizziness). C. If patient asymptomatic, discharge patient. If patient symptomatic, repeat capillary blood glucose (CBG) and notify HBO physician. If result is 101 mg/dl to 249 mg/dl: A. Discharge patient. A. Notify HBO physician and await physician orders. B. It is recommended to do blood/urine ketone If result is 250 mg/dl or greater: testing. C. If the result meets the hospital definition of a critical result, follow hospital policy. *Juice or candies are NOT equivalent products. If patient refuses the Glucerna or Ensure, please consult the hospital dietitian for an appropriate substitute. Electronic Signature(s) Signed: 07/31/2022 6:42:42 PM By: Worthy Keeler PA-C Signed: 07/31/2022 6:57:47 PM By: Deon Pilling RN, BSN Entered By: Deon Pilling on 07/31/2022 09:08:36 -------------------------------------------------------------------------------- Problem List Details Patient Name: Date of Service: [name], [name] 07/31/2022 8:00 A M Medical Record Number: [id number] Patient Account Number: [id number] Date of Birth/Sex: Treating RN: 11-07-1965 (56 y.o. F) Primary Care Provider: Stoney Bang Other Clinician: Referring Provider: Treating Provider/Extender: Dallas Breeding in Treatment: 7 Active Problems ICD-10 Encounter Code Description Active Date MDM Diagnosis E11.621 Type 2 diabetes mellitus with foot  ulcer 06/12/2022 No Yes L97.412 Non-pressure chronic ulcer of right heel and midfoot with fat layer exposed 06/12/2022 No Yes [name], [name] ([id number]) [id number] Page 7 of 10 I89.0 Lymphedema, not elsewhere classified 06/12/2022 No Yes Inactive Problems Resolved Problems Electronic Signature(s) Signed: 07/31/2022 8:19:58 AM By: Worthy Keeler PA-C Entered By: Worthy Keeler on 07/31/2022 08:19:58 -------------------------------------------------------------------------------- Progress Note Details Patient Name: Date of Service: [name], [name] 07/31/2022 8:00 A M Medical Record Number: [id number] Patient Account Number: [id number] Date of Birth/Sex: Treating RN: Dec 13, 1965 (56 y.o. F) Primary Care Provider: Stoney Bang Other Clinician: Referring Provider: Treating Provider/Extender: Dallas Breeding in Treatment: 7 Subjective Chief Complaint Information obtained from Patient Right heel ulcer History of Present Illness (HPI) 06-13-2022 upon evaluation today patient presents for evaluation of her right heel ulcer. She is having a tremendous amount of pain at this location. She tells me that her most recent hemoglobin A1c was 8.7 and that was on 08-23-2021. This ulcer on the heel has been present she states since February 2023 when she had an injection to the heel by podiatry. She states that she began to have increasing pain the wound opened and has been open ever since. 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 She had an x-ray in June if anything can although there did not appear to be any obvious evidence of osteomyelitis at that point. She subsequently has had OR debridement several times of the wound performed by podiatry. Patient does have a history of lymphedema of the lower extremities she is also a type II diabetic. 06-19-2022 upon evaluation today patient appears to be doing better in regard to the size of her  leg which is significantly improved. With that being said she had a lot of drainage from the heel which is completely understandable considering what is going on at this time. There does not appear to be any evidence of active infection there is no warmth or irritation to  the leg in general. With that being said the patient does have an issue here with the amount of drainage that is going on I definitely think Zetuvit is good to be the better way to go in place of ABD pads. Also think that she could potentially benefit from 3 times per week dressing changes but again right now we are going to stick with the to change it today, change in her Friday, and then subsequently seeing where things stand next Wednesday. The other option would be to change her to a different day for wound care having her come then say like a Tuesday Friday or Monday Thursday. 06-26-2022 upon evaluation patient's wound bed actually showed signs of doing well in regard to the overall size it was measuring slightly smaller. With that being said unfortunately the biggest issue we see is she still has a tremendous amount of drainage which is what could keep this from being able to use a total contact cast. For that reason I am did going to discuss with her today the possibility of trying Tubigrip to see if that could be of benefit for her. She voiced understanding and is in agreement with giving this a try. 07-03-2022 upon evaluation today patient unfortunately appears to be doing significantly worse at this point in regard to her swelling due to the fact that she was unable to really wear the Tubigrip. She tells me that it was cutting into her. With that being said she also did have an MRI this MRI revealed that she does have osteomyelitis in the heel this was actually just performed Monday, 25 September. This does show signs of "early osteomyelitis". Nonetheless this is still concerning to me. The wound also appears to be getting  deeper in the center aspect of this which has me a little concerned as well. I do believe that she is likely going require an aggressive approach here to try to get things better. I discussed that in greater detail with her today. I will detailed in the plan. 07-17-2022 upon evaluation today patient appears to be doing poorly in regard to her wound. Unfortunately she does not show any signs of infection systemically though locally this seems to be doing worse with the wound actually being bigger than where it was previous. She did have an MRI that showed evidence of osteomyelitis actually recommended a referral to ID she was evaluated infectious disease and they recommended referral to podiatry and to be honest have recommended amputation based on what the patient tells me today. With that being said this is something that she is not interested in at all to be perfectly honest. For that reason she wants to know what she should do and where she should go at this point that is where the majority of the conversation which was quite lengthy today went. Obviously understand her concern here but I think she is going to have to definitely get off this and likely this means she is going to need to come out of work. 10/18; Since the patient was the patient is using Hydrofera Blue on her right heel wound. She has a new larger open area superior to the wound. The skin on the bottom of her foot is completely macerated. I reviewed the infectious disease note on this patient from 10/9. They recommended keeping the patient on Delofloxacin 450 twice daily until follow-up tomorrow. The patient states she is not going back there as the only thing they wanted to do was "cut  my leg off]. She has not seen podiatry. Her lab works [place], [name] ([id number]) [id number] Page 8 of 10 shows a CRP markedly elevated at 240.2 a sedimentation rate of 96 the rest of her CBC is normal creatinine of  0.38 The patient has been to see her primary doctor who is Dr. Melonie Florida [SPo] In [place]. According the patient Dr. Mylinda Latina has ordered vancomycin and Zosyn to start on November 1. She is not taking the delofloxacin that was suggested by infectious disease. Very angry that they just wanted to consider her for an amputation although I do not specifically see that stated in their note 07-31-2022 upon evaluation today patient still has a significant wound over her plantar aspect of her right l foot region. With that being said I am definitely concerned about the fact that the patient probably does need to be in hyperbaric oxygen therapy at this point. I think we should try to get this going as quickly as possible. She actually is going to be set up with a PICC line next Wednesday and then following this we will have the vancomycin and Zosyn that she will be taking for 8 weeks according to what she tells me. I think this is definitely going to be beneficial and again the goal here is limb salvage. In combination with that I think the hyperbaric oxygen therapy would be ultimately significantly helpful for her. Objective Constitutional Obese and well-hydrated in no acute distress. Vitals Time Taken: 8:10 AM, Height: 65 in, Weight: 331 lbs, BMI: 55.1, Temperature: 98.4 F, Pulse: 120 bpm, Respiratory Rate: 20 breaths/min, Blood Pressure: 131/80 mmHg. Respiratory normal breathing without difficulty. Psychiatric this patient is able to make decisions and demonstrates good insight into disease process. Alert and Oriented x 3. pleasant and cooperative. General Notes: Upon inspection patient's wound bed actually showed signs of still having a tremendous amount of drainage I do believe this is better than when I saw her last and they are changing this much more frequently as far as the dressing change timeframe in order to try to keep this under control as much as possible. I think that still something this would  be good to do ongoing. I did perform some sharp debridement today to clearway some of the necrotic debris is much as I could which she tolerated without complication. Postdebridement the wound bed actually appears to be better this was a minimal debridement really around the edges of the wound most significantly. With that being said I do believe nonetheless that she is going to need ongoing aggressive care in order to get this to heal. Integumentary (Hair, Skin) Wound #1 status is Open. Original cause of wound was Gradually Appeared. The date acquired was: 11/07/2020. The wound has been in treatment 7 weeks. The wound is located on the Right Calcaneus. The wound measures 9.3cm length x 4.4cm width x 1cm depth; 32.138cm^2 area and 32.138cm^3 volume. There is Fat Layer (Subcutaneous Tissue) exposed. There is no tunneling or undermining noted. There is a medium amount of serosanguineous drainage noted. The wound margin is distinct with the outline attached to the wound base. There is large (67-100%) red, pink granulation within the wound bed. There is a small (1- 33%) amount of necrotic tissue within the wound bed including Adherent Slough. The periwound skin appearance did not exhibit: Callus, Crepitus, Excoriation, Induration, Rash, Scarring, Dry/Scaly, Maceration, Atrophie Blanche, Cyanosis, Ecchymosis, Hemosiderin Staining, Mottled, Pallor, Rubor, Erythema. Periwound temperature was noted as No Abnormality. Wound #2 status is Open. Original  cause of wound was Gradually Appeared. The date acquired was: 07/17/2022. The wound has been in treatment 2 weeks. The wound is located on the Right T Second. The wound measures 0.4cm length x 0.8cm width x 0.2cm depth; 0.251cm^2 area and 0.05cm^3 volume. There is oe Fat Layer (Subcutaneous Tissue) exposed. There is no tunneling noted, however, there is undermining starting at 3:00 and ending at 9:00 with a maximum distance of 0.5cm. There is a medium amount of  serosanguineous drainage noted. There is small (1-33%) granulation within the wound bed. There is a large (67- 100%) amount of necrotic tissue within the wound bed including Adherent Slough. The periwound skin appearance exhibited: Dry/Scaly. The periwound skin appearance did not exhibit: Callus, Crepitus, Excoriation, Induration, Rash, Scarring, Maceration, Atrophie Blanche, Cyanosis, Ecchymosis, Hemosiderin Staining, Mottled, Pallor, Rubor, Erythema. Periwound temperature was noted as No Abnormality. Assessment Active Problems ICD-10 Type 2 diabetes mellitus with foot ulcer Non-pressure chronic ulcer of right heel and midfoot with fat layer exposed Lymphedema, not elsewhere classified Procedures Wound #1 Pre-procedure diagnosis of Wound #1 is a Diabetic Wound/Ulcer of the Lower Extremity located on the Right Calcaneus .Severity of Tissue Pre Debridement is: Fat layer exposed. There was a Excisional Skin/Subcutaneous Tissue Debridement with a total area of 18 sq cm performed by Worthy Keeler, PA. With the following instrument(s): Curette to remove Viable and Non-Viable tissue/material. Material removed includes Callus, Subcutaneous Tissue, Slough, Skin: Dermis, and Skin: Epidermis after achieving pain control using Lidocaine 5% topical ointment. A time out was conducted at 08:50, prior to the start of the procedure. There was no bleeding. The procedure was tolerated well with a pain level of 0 throughout and a pain level of 0 following the procedure. Post Debridement Measurements: 9cm length x 2cm width x 1cm depth; 14.137cm^3 volume. Character of Wound/Ulcer Post Debridement is improved. Severity of Tissue Post Debridement is: Fat layer exposed. Post procedure Diagnosis Wound #1: Same as Pre-Procedure [name], [name] ([id number]) [id number] Page 9 of 10 General Notes: Documented by Deon Pilling, RN.Marland Kitchen Wound #2 Pre-procedure diagnosis of Wound #2 is a Diabetic  Wound/Ulcer of the Lower Extremity located on the Right T Second .Severity of Tissue Pre Debridement oe is: Fat layer exposed. There was a Excisional Skin/Subcutaneous Tissue Debridement with a total area of 0.64 sq cm performed by Worthy Keeler, PA. With the following instrument(s): Curette to remove Viable and Non-Viable tissue/material. Material removed includes Callus, Subcutaneous Tissue, Slough, Skin: Dermis, and Skin: Epidermis after achieving pain control using Lidocaine 5% topical ointment. A time out was conducted at 08:50, prior to the start of the procedure. There was no bleeding. The procedure was tolerated well with a pain level of 0 throughout and a pain level of 0 following the procedure. Post Debridement Measurements: 0.4cm length x 0.8cm width x 0.2cm depth; 0.05cm^3 volume. Character of Wound/Ulcer Post Debridement is improved. Severity of Tissue Post Debridement is: Fat layer exposed. Post procedure Diagnosis Wound #2: Same as Pre-Procedure Plan Follow-up Appointments: Return Appointment in 1 week. - Wednesday w/ Jeri Cos Other: - Ensure you continue oral antibiotics until PICC line place, then start and finish all IV antibiotics- Vancomycin and Zosyn (ordered primary care provider). Consider Hyberbaric Oxygen Therapy. Anesthetic: (In clinic) Topical Lidocaine 5% applied to wound bed Bathing/ Shower/ Hygiene: May shower with protection but do not get wound dressing(s) wet. Edema Control - Lymphedema / SCD / Other: Elevate legs to the level of the heart or above for 30 minutes daily and/or when sitting,  a frequency of: - 3-4 times a day throughout the day. Avoid standing for long periods of time. Off-Loading: Open toe surgical shoe to: - w/ peg assist Other: - Minimize walking and standing as much as possible to right foot to aid in offloading to wound. While at work use the wheelchair for mobility. Hyperbaric Oxygen Therapy: Wound #1 Right Calcaneus: Evaluate for HBO  Therapy Indication: - Wagner Grade 3 to right calcaneus If appropriate for treatment, begin HBOT per protocol: 2.5 ATA for 90 Minutes with 2 Five (5) Minute Air Breaks T Number of Treatments: - 40 otal One treatments per day (delivered Monday through Friday unless otherwise specified in Special Instructions below): Finger stick Blood Glucose Pre- and Post- HBOT Treatment. Follow Hyperbaric Oxygen Glycemia Protocol Afrin (Oxymetazoline HCL) 0.05% nasal spray - 1 spray in both nostrils daily as needed prior to HBO treatment for difficulty clearing ears WOUND #1: - Calcaneus Wound Laterality: Right Cleanser: Soap and Water 1 x Per Day/15 Days Discharge Instructions: May shower and wash wound with dial antibacterial soap and water prior to dressing change. Cleanser: Wound Cleanser (Generic) 1 x Per Day/15 Days Discharge Instructions: Cleanse the wound with wound cleanser prior to applying a clean dressing using gauze sponges, not tissue or cotton balls. Peri-Wound Care: Triamcinolone 15 (g) 1 x Per Day/15 Days Discharge Instructions: Use triamcinolone 15 (g) as directed Peri-Wound Care: Zinc Oxide Ointment 30g tube 1 x Per Day/15 Days Discharge Instructions: Apply Zinc Oxide to periwound with each dressing change Peri-Wound Care: Sween Lotion (Moisturizing lotion) 1 x Per Day/15 Days Discharge Instructions: Apply moisturizing lotion as directed Prim Dressing: KerraCel Ag Gelling Fiber Dressing, 4x5 in (silver alginate) 1 x Per Day/15 Days ary Discharge Instructions: Apply silver alginate to wound bed as instructed Secondary Dressing: ABD Pad, 5x9 (Generic) 1 x Per Day/15 Days Discharge Instructions: Apply over primary dressing as directed. Secondary Dressing: Drawtex 4x4 in 1 x Per Day/15 Days Discharge Instructions: Apply over primary dressing as directed. Secondary Dressing: Woven Gauze Sponge, Non-Sterile 4x4 in (Generic) 1 x Per Day/15 Days Discharge Instructions: Apply over primary  dressing as directed. Secured With: Elastic Bandage 4 inch (ACE bandage) (Generic) 1 x Per Day/15 Days Discharge Instructions: Secure with ACE bandage as directed. Secured With: [employer], 4.5x3.1 (in/yd) (Generic) 1 x Per Day/15 Days Discharge Instructions: Secure with Kerlix as directed. Secured With: 38M Medipore H Soft Cloth Surgical T ape, 4 x 10 (in/yd) (Generic) 1 x Per Day/15 Days Discharge Instructions: Secure with tape as directed. WOUND #2: - T Second Wound Laterality: Right oe Cleanser: Soap and Water 1 x Per [id number] Days Discharge Instructions: May shower and wash wound with dial antibacterial soap and water prior to dressing change. Cleanser: Wound Cleanser (Generic) 1 x Per Day/15 Days Discharge Instructions: Cleanse the wound with wound cleanser prior to applying a clean dressing using gauze sponges, not tissue or cotton balls. Peri-Wound Care: Triamcinolone 15 (g) 1 x Per Day/15 Days Discharge Instructions: Use triamcinolone 15 (g) as directed Peri-Wound Care: Zinc Oxide Ointment 30g tube 1 x Per Day/15 Days Discharge Instructions: Apply Zinc Oxide to periwound with each dressing change Peri-Wound Care: Sween Lotion (Moisturizing lotion) 1 x Per Day/15 Days Discharge Instructions: Apply moisturizing lotion as directed Prim Dressing: KerraCel Ag Gelling Fiber Dressing, 4x5 in (silver alginate) 1 x Per Day/15 Days ary Discharge Instructions: Apply silver alginate to wound bed as instructed Secondary Dressing: Woven Gauze Sponge, Non-Sterile 4x4 in (Generic) 1 x Per [id number] Days Discharge  Instructions: Apply over primary dressing as directed. Secured With: 60M Medipore H Soft Cloth Surgical T ape, 4 x 10 (in/yd) (Generic) 1 x Per Day/15 Days Discharge Instructions: Secure with tape as directed. 1. Based on what I am seeing currently I discussed with the patient that I do believe that she would benefit from the IV antibiotics and I am very pleased that [name], [name]  ([id number]) [id number] Page 10 of 10 her primary care provider is jumping in to get this going for her. 2. I am also going to suggest that we should support this by getting her into hyperbaric oxygen therapy as quickly as possible. I think that that would be of benefit as well and I discussed that with her today. We will get a sure what the chambers look like and then we will work on getting the approval starting next Wednesday. The reason we are waiting is because her insurance actually starts over on Wednesday and will be a completely new company. Again there is no point in going through the authorization at this point only have to start over then. Nonetheless I do believe that this will give her the best chance at being able to achieve limb salvage. Again the healing rates of Wagner grade 3 ulcers with hyperbarics versus without are around 75 to 80% with hyperbarics and without would look in more around 50%. Nonetheless I think that she would again be in agreement with continuing with the current treatment and adding the hyperbarics if we can gain approval. I think that will be critical to limb salvage. 3. For the time being she is going to continue with the doxycycline which her and her son both state has helped tremendously with her inflammation and infection in regard to the leg. I believe that this is something she should continue until she gets on the IV antibiotics. We will see patient back for reevaluation in 1 week here in the clinic. If anything worsens or changes patient will contact our office for additional recommendations. Electronic Signature(s) Signed: 07/31/2022 10:40:19 AM By: Worthy Keeler PA-C Entered By: Worthy Keeler on 07/31/2022 10:40:19 -------------------------------------------------------------------------------- SuperBill Details Patient Name: Date of Service: [name], [name] 07/31/2022 Medical Record Number: [id number] Patient Account  Number: [id number] Date of Birth/Sex: Treating RN: 01-04-1966 (56 y.o. Helene Shoe, Meta.Reding Primary Care Provider: Stoney Bang Other Clinician: Referring Provider: Treating Provider/Extender: Dallas Breeding in Treatment: 7 Diagnosis Coding ICD-10 Codes Code Description [phone] Type 2 diabetes mellitus with foot ulcer L97.412 Non-pressure chronic ulcer of right heel and midfoot with fat layer exposed I89.0 Lymphedema, not elsewhere classified Facility Procedures : CPT4 Code: [id number] Description: [place] - DEB SUBQ TISSUE 20 SQ CM/< ICD-10 Diagnosis Description L97.412 Non-pressure chronic ulcer of right heel and midfoot with fat layer exposed Modifier: Quantity: 1 Physician Procedures : CPT4 Code Description Modifier [id number] - WC PHYS LEVEL 4 - EST PT 25 ICD-10 Diagnosis Description E11.621 Type 2 diabetes mellitus with foot ulcer L97.412 Non-pressure chronic ulcer of right heel and midfoot with fat layer exposed I89.0  Lymphedema, not elsewhere classified Quantity: 1 : [id number] - WC PHYS SUBQ TISS 20 SQ CM ICD-10 Diagnosis Description L97.412 Non-pressure chronic ulcer of right heel and midfoot with fat layer exposed Quantity: 1 Electronic Signature(s) Signed: 07/31/2022 10:42:25 AM By: Worthy Keeler PA-C Entered By: Worthy Keeler on 07/31/2022 10:42:24

## 2022-08-01 NOTE — Progress Notes (Signed)
[name], [name] ([id number]) [id number] Page 1 of 8 Visit Report for 07/31/2022 Arrival Information Details Patient Name: Date of Service: [name], [name] 07/31/2022 8:00 A M Medical Record Number: [id number] Patient Account Number: [id number] Date of Birth/Sex: Treating RN: 1966/06/08 (56 y.o. [name] Primary Care Amerigo Mcglory: [name] Other Clinician: Referring Avira Tillison: Treating Christasia Angeletti/Extender: [name] in Treatment: 7 Visit Information History Since Last Visit Added or deleted any medications: Yes Patient Arrived: Ambulatory Any new allergies or adverse reactions: No Arrival Time: 08:09 Had a fall or experienced change in No Accompanied By: son activities of daily living that may affect Transfer Assistance: None risk of falls: Patient Identification Verified: Yes Signs or symptoms of abuse/neglect since last visito No Secondary Verification Process Completed: Yes Hospitalized since last visit: No Patient Requires Transmission-Based Precautions: No Implantable device outside of the clinic excluding No Patient Has Alerts: No cellular tissue based products placed in the center since last visit: Has Dressing in Place as Prescribed: Yes Pain Present Now: Yes Notes starts PICC Line with IV abx zosyn and vanc. Electronic Signature(s) Signed: 07/31/2022 6:57:47 PM By: Shawn Stall RN, BSN Entered By: Shawn Stall on 07/31/2022 08:10:41 -------------------------------------------------------------------------------- Encounter Discharge Information Details Patient Name: Date of Service: [name] 07/31/2022 8:00 A M Medical Record Number: [id number] Patient Account Number: [id number] Date of Birth/Sex: Treating RN: 1966/01/08 (56 y.o. [name] Primary Care Fenix Rorke: [name] Other Clinician: Referring Avionna Bower: Treating Orvella Digiulio/Extender: [name] in Treatment: 7 Encounter  Discharge Information Items Post Procedure Vitals Discharge Condition: Stable Temperature (F): 98.4 Ambulatory Status: Ambulatory Pulse (bpm): 120 Discharge Destination: Home Respiratory Rate (breaths/min): 20 Transportation: Private Auto Blood Pressure (mmHg): 131/80 Accompanied By: son Schedule Follow-up Appointment: Yes Clinical Summary of Care: Electronic Signature(s) Signed: 07/31/2022 6:57:47 PM By: Shawn Stall RN, BSN Entered By: Shawn Stall on 07/31/2022 09:09:36 Laing, [name] ([id number]) [id number] Page 2 of 8 -------------------------------------------------------------------------------- Lower Extremity Assessment Details Patient Name: Date of Service: [name] 07/31/2022 8:00 A M Medical Record Number: [id number] Patient Account Number: [id number] Date of Birth/Sex: Treating RN: 1966-03-20 (56 y.o. [name] Primary Care Vicki Chaffin: [name] Other Clinician: Referring Jovanka Westgate: Treating Tamaj Jurgens/Extender: [name] in Treatment: 7 Edema Assessment Assessed: [Left: No] [Right: Yes] Edema: [Left: Ye] [Right: s] Calf Left: Right: Point of Measurement: 32 cm From Medial Instep 64 cm Ankle Left: Right: Point of Measurement: 10 cm From Medial Instep 37 cm Vascular Assessment Pulses: Dorsalis Pedis Palpable: [Right:Yes] Electronic Signature(s) Signed: 07/31/2022 6:57:47 PM By: Shawn Stall RN, BSN Entered By: Shawn Stall on 07/31/2022 08:14:34 -------------------------------------------------------------------------------- Multi-Disciplinary Care Plan Details Patient Name: Date of Service: [name] 07/31/2022 8:00 A M Medical Record Number: [id number] Patient Account Number: [id number] Date of Birth/Sex: Treating RN: [birth year] (56 y.o. [name] Primary Care Coal Nearhood: [name] Other Clinician: Referring Kamarrion Stfort: Treating Taiden Raybourn/Extender: [name] in Treatment: 7 Active Inactive Wound/Skin Impairment Nursing Diagnoses: Impaired tissue integrity Knowledge deficit related to ulceration/compromised skin integrity Goals: Patient will have a decrease in wound volume by X% from date: (specify in notes) Date Initiated: 06/12/2022 Target Resolution Date: 08/30/2022 Goal Status: Active Patient/caregiver will verbalize understanding of skin care regimen Date Initiated: 06/12/2022 Target Resolution Date: 08/03/2022 Goal Status: Active Ulcer/skin breakdown will have a volume reduction of 30% by week 4 [name], [name] ([id number]) [id number] Page 3 of 8 Date Initiated: 06/12/2022 Date Inactivated: 07/31/2022 Target Resolution Date: 08/03/2022 Goal Status: Unmet Unmet Reason: dx  with osteomyelitis. Ulcer/skin breakdown will have a volume reduction of 50% by week 8 Date Initiated: 06/12/2022 Target Resolution Date: 08/07/2022 Goal Status: Active Interventions: Assess patient/caregiver ability to obtain necessary supplies Assess patient/caregiver ability to perform ulcer/skin care regimen upon admission and as needed Assess ulceration(s) every visit Notes: Electronic Signature(s) Signed: 07/31/2022 6:57:47 PM By: Shawn Stall RN, BSN Entered By: Shawn Stall on 07/31/2022 08:57:36 -------------------------------------------------------------------------------- Pain Assessment Details Patient Name: Date of Service: [name] 07/31/2022 8:00 A M Medical Record Number: [id number] Patient Account Number: [id number] Date of Birth/Sex: Treating RN: 10-Jun-1966 (56 y.o. [name] Primary Care Cabria Micalizzi: [name] Other Clinician: Referring Edmundo Tedesco: Treating Daion Ginsberg/Extender: [name] in Treatment: 7 Active Problems Location of Pain Severity and Description of Pain Patient Has Paino Yes Site Locations Rate the pain. Current Pain Level: 8 Pain Management and  Medication Current Pain Management: Medication: No Cold Application: No Rest: No Massage: No Activity: No T.E.N.S.: No Heat Application: No Leg drop or elevation: No Is the Current Pain Management Adequate: Adequate How does your wound impact your activities of daily livingo Sleep: No Bathing: No Appetite: No Relationship With Others: No Bladder Continence: No Emotions: No Bowel Continence: No Work: No Toileting: No Drive: No Dressing: No Hobbies: No Electronic Signature(s) Pfeffer, [name] ([id number]) [id number] Page 4 of 8 Signed: 07/31/2022 6:57:47 PM By: Shawn Stall RN, BSN Entered By: Shawn Stall on 07/31/2022 08:12:48 -------------------------------------------------------------------------------- Patient/Caregiver Education Details Patient Name: Date of Service: [name], [name] 10/25/2023andnbsp8:00 A M Medical Record Number: [id number] Patient Account Number: [id number] Date of Birth/Gender: Treating RN: Sep 17, 1966 (56 y.o. [name] Primary Care Physician: [name] Other Clinician: Referring Physician: Treating Physician/Extender: [name] in Treatment: 7 Education Assessment Education Provided To: Patient Education Topics Provided Wound/Skin Impairment: Handouts: Skin Care Do's and Dont's Methods: Explain/Verbal Responses: Reinforcements needed Electronic Signature(s) Signed: 07/31/2022 6:57:47 PM By: Shawn Stall RN, BSN Entered By: Shawn Stall on 07/31/2022 08:58:00 -------------------------------------------------------------------------------- Wound Assessment Details Patient Name: Date of Service: [name], [name] 07/31/2022 8:00 A M Medical Record Number: [id number] Patient Account Number: [id number] Date of Birth/Sex: Treating RN: April 15, 1966 (56 y.o. [name] Primary Care Jaden Batchelder: [name] Other Clinician: Referring Porsche Noguchi: Treating Alden Feagan/Extender: [name] Weeks in Treatment: 7 Wound Status Wound Number: 1 Primary Etiology: Diabetic Wound/Ulcer of the Lower Extremity Wound Location: Right Calcaneus Wound Status: Open Wounding Event: Gradually Appeared Comorbid History: Hypertension, Type II Diabetes, Osteomyelitis Date Acquired: 11/07/2020 Weeks Of Treatment: 7 Clustered Wound: No Photos Achord, [name] ([id number]) [id number] Page 5 of 8 Wound Measurements Length: (cm) 9.3 Width: (cm) 4.4 Depth: (cm) 1 Area: (cm) 32.138 Volume: (cm) 32.138 % Reduction in Area: -176.5% % Reduction in Volume: -1282.3% Epithelialization: None Tunneling: No Undermining: No Wound Description Classification: Grade 3 Wound Margin: Distinct, outline attached Exudate Amount: Medium Exudate Type: Serosanguineous Exudate Color: red, brown Foul Odor After Cleansing: No Slough/Fibrino No Wound Bed Granulation Amount: Large (67-100%) Exposed Structure Granulation Quality: Red, Pink Fascia Exposed: No Necrotic Amount: Small (1-33%) Fat Layer (Subcutaneous Tissue) Exposed: Yes Necrotic Quality: Adherent Slough Tendon Exposed: No Muscle Exposed: No Joint Exposed: No Bone Exposed: No Periwound Skin Texture Texture Color No Abnormalities Noted: No No Abnormalities Noted: No Callus: No Atrophie Blanche: No Crepitus: No Cyanosis: No Excoriation: No Ecchymosis: No Induration: No Erythema: No Rash: No Hemosiderin Staining: No Scarring: No Mottled: No Pallor: No Moisture Rubor: No No Abnormalities Noted: No Dry / Scaly: No Temperature / Pain Maceration:  No Temperature: No Abnormality Treatment Notes Wound #1 (Calcaneus) Wound Laterality: Right Cleanser Soap and Water Discharge Instruction: May shower and wash wound with dial antibacterial soap and water prior to dressing change. Wound Cleanser Discharge Instruction: Cleanse the wound with wound cleanser prior to applying a clean dressing using  gauze sponges, not tissue or cotton balls. Peri-Wound Care Triamcinolone 15 (g) Discharge Instruction: Use triamcinolone 15 (g) as directed Zinc Oxide Ointment 30g tube Discharge Instruction: Apply Zinc Oxide to periwound with each dressing change Sween Lotion (Moisturizing lotion) Discharge Instruction: Apply moisturizing lotion as directed Topical Primary Dressing KerraCel Ag Gelling Fiber Dressing, 4x5 in (silver alginate) Sheppard, [name] ([id number]) [id number] Page 6 of 8 Discharge Instruction: Apply silver alginate to wound bed as instructed Secondary Dressing ABD Pad, 5x9 Discharge Instruction: Apply over primary dressing as directed. Drawtex 4x4 in Discharge Instruction: Apply over primary dressing as directed. Woven Gauze Sponge, Non-Sterile 4x4 in Discharge Instruction: Apply over primary dressing as directed. Secured With Elastic Bandage 4 inch (ACE bandage) Discharge Instruction: Secure with ACE bandage as directed. Kerlix Roll Sterile, 4.5x3.1 (in/yd) Discharge Instruction: Secure with Kerlix as directed. 26M Medipore H Soft Cloth Surgical T ape, 4 x 10 (in/yd) Discharge Instruction: Secure with tape as directed. Compression Wrap Compression Stockings Add-Ons Electronic Signature(s) Signed: 07/31/2022 5:59:56 PM By: Sharyn Creamer RN, BSN Signed: 07/31/2022 6:57:47 PM By: Deon Pilling RN, BSN Entered By: Sharyn Creamer on 07/31/2022 08:25:45 -------------------------------------------------------------------------------- Wound Assessment Details Patient Name: Date of Service: [name], [name] 07/31/2022 8:00 A M Medical Record Number: [id number] Patient Account Number: [id number] Date of Birth/Sex: Treating RN: Feb 15, 1966 (56 y.o. Helene Shoe, Meta.Reding Primary Care Mehmet Scally: Stoney Bang Other Clinician: Referring Satori Krabill: Treating Adlai Nieblas/Extender: Dallas Breeding in Treatment: 7 Wound Status Wound Number: 2 Primary  Etiology: Diabetic Wound/Ulcer of the Lower Extremity Wound Location: Right T Second oe Wound Status: Open Wounding Event: Gradually Appeared Comorbid History: Hypertension, Type II Diabetes, Osteomyelitis Date Acquired: 07/17/2022 Weeks Of Treatment: 2 Clustered Wound: No Photos Wound Measurements Length: (cm) 0.4 Width: (cm) 0.8 Depth: (cm) 0.2 Area: (cm) 0.251 [name], [name] ([id number]) Volume: (cm) 0.05 % Reduction in Area: 54.4% % Reduction in Volume: 54.5% Epithelialization: None Tunneling: No [id number] Page 7 of 8 Undermining: Yes Starting Position (o'clock): 3 Ending Position (o'clock): 9 Maximum Distance: (cm) 0.5 Wound Description Classification: Grade 2 Exudate Amount: Medium Exudate Type: Serosanguineous Exudate Color: red, brown Foul Odor After Cleansing: No Slough/Fibrino Yes Wound Bed Granulation Amount: Small (1-33%) Exposed Structure Necrotic Amount: Large (67-100%) Fascia Exposed: No Necrotic Quality: Adherent Slough Fat Layer (Subcutaneous Tissue) Exposed: Yes Tendon Exposed: No Muscle Exposed: No Joint Exposed: No Bone Exposed: No Periwound Skin Texture Texture Color No Abnormalities Noted: No No Abnormalities Noted: No Callus: No Atrophie Blanche: No Crepitus: No Cyanosis: No Excoriation: No Ecchymosis: No Induration: No Erythema: No Rash: No Hemosiderin Staining: No Scarring: No Mottled: No Pallor: No Moisture Rubor: No No Abnormalities Noted: No Dry / Scaly: Yes Temperature / Pain Maceration: No Temperature: No Abnormality Treatment Notes Wound #2 (Toe Second) Wound Laterality: Right Cleanser Soap and Water Discharge Instruction: May shower and wash wound with dial antibacterial soap and water prior to dressing change. Wound Cleanser Discharge Instruction: Cleanse the wound with wound cleanser prior to applying a clean dressing using gauze sponges, not tissue or cotton balls. Peri-Wound  Care Triamcinolone 15 (g) Discharge Instruction: Use triamcinolone 15 (g) as directed Zinc Oxide Ointment 30g tube Discharge Instruction: Apply Zinc Oxide to periwound with each dressing change  Sween Lotion (Moisturizing lotion) Discharge Instruction: Apply moisturizing lotion as directed Topical Primary Dressing KerraCel Ag Gelling Fiber Dressing, 4x5 in (silver alginate) Discharge Instruction: Apply silver alginate to wound bed as instructed Secondary Dressing Woven Gauze Sponge, Non-Sterile 4x4 in Discharge Instruction: Apply over primary dressing as directed. Secured With 91M Medipore H Soft Cloth Surgical T ape, 4 x 10 (in/yd) Discharge Instruction: Secure with tape as directed. Compression Wrap Compression Stockings Add-Ons [name], [name] ([id number]) [id number] Page 8 of 8 Electronic Signature(s) Signed: 07/31/2022 5:59:56 PM By: Redmond Pulling RN, BSN Signed: 07/31/2022 6:57:47 PM By: Shawn Stall RN, BSN Entered By: Redmond Pulling on 07/31/2022 90:38:33 -------------------------------------------------------------------------------- Vitals Details Patient Name: Date of Service: [name], [name] 07/31/2022 8:00 A M Medical Record Number: [id number] Patient Account Number: [id number] Date of Birth/Sex: Treating RN: 1966/04/18 (56 y.o. [name], Millard.Loa Primary Care Tigerlily Christine: [name] Other Clinician: Referring Jadavion Spoelstra: Treating Zellie Jenning/Extender: [name] in Treatment: 7 Vital Signs Time Taken: 08:10 Temperature (F): 98.4 Height (in): 65 Pulse (bpm): 120 Weight (lbs): 331 Respiratory Rate (breaths/min): 20 Body Mass Index (BMI): 55.1 Blood Pressure (mmHg): 131/80 Reference Range: 80 - 120 mg / dl Electronic Signature(s) Signed: 07/31/2022 6:57:47 PM By: Shawn Stall RN, BSN Entered By: Shawn Stall on 07/31/2022 08:12:36

## 2022-08-07 ENCOUNTER — Encounter (HOSPITAL_BASED_OUTPATIENT_CLINIC_OR_DEPARTMENT_OTHER): Payer: No Typology Code available for payment source | Attending: Physician Assistant | Admitting: Physician Assistant

## 2022-08-07 DIAGNOSIS — E11621 Type 2 diabetes mellitus with foot ulcer: Secondary | ICD-10-CM | POA: Diagnosis present

## 2022-08-07 DIAGNOSIS — L97412 Non-pressure chronic ulcer of right heel and midfoot with fat layer exposed: Secondary | ICD-10-CM | POA: Diagnosis not present

## 2022-08-07 DIAGNOSIS — I10 Essential (primary) hypertension: Secondary | ICD-10-CM | POA: Diagnosis not present

## 2022-08-07 DIAGNOSIS — M86671 Other chronic osteomyelitis, right ankle and foot: Secondary | ICD-10-CM | POA: Insufficient documentation

## 2022-08-07 DIAGNOSIS — L98412 Non-pressure chronic ulcer of buttock with fat layer exposed: Secondary | ICD-10-CM | POA: Diagnosis not present

## 2022-08-07 DIAGNOSIS — L97512 Non-pressure chronic ulcer of other part of right foot with fat layer exposed: Secondary | ICD-10-CM | POA: Diagnosis not present

## 2022-08-07 DIAGNOSIS — I890 Lymphedema, not elsewhere classified: Secondary | ICD-10-CM | POA: Insufficient documentation

## 2022-08-07 NOTE — Progress Notes (Addendum)
Ullman, [name] ([id number]) [id number] Page 1 of 9 Visit Report for 08/07/2022 Chief Complaint Document Details Patient Name: Date of Service: [name], [name] 08/07/2022 2:00 PM Medical Record Number: [id number] Patient Account Number: [id number] Date of Birth/Sex: Treating RN: 1966/02/03 (56 y.o. F) Primary Care Provider: Stoney Bang Other Clinician: Referring Provider: Treating Provider/Extender: Dallas Breeding in Treatment: 8 Information Obtained from: Patient Chief Complaint Right heel ulcer Electronic Signature(s) Signed: 08/07/2022 2:07:49 PM By: Worthy Keeler PA-C Entered By: Worthy Keeler on 08/07/2022 14:07:49 -------------------------------------------------------------------------------- HPI Details Patient Name: Date of Service: [name] 08/07/2022 2:00 PM Medical Record Number: [id number] Patient Account Number: [id number] Date of Birth/Sex: Treating RN: 02/13/1966 (56 y.o. F) Primary Care Provider: Stoney Bang Other Clinician: Referring Provider: Treating Provider/Extender: Dallas Breeding in Treatment: 8 History of Present Illness HPI Description: 06-13-2022 upon evaluation today patient presents for evaluation of her right heel ulcer. She is having a tremendous amount of pain at this location. She tells me that her most recent hemoglobin A1c was 8.7 and that was on 08-23-2021. This ulcer on the heel has been present she states since February 2023 when she had an injection to the heel by podiatry. She states that she began to have increasing pain the wound opened and has been open ever since. 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 She had an x-ray in June if anything can although there did not appear to be any obvious evidence of osteomyelitis at that point. She subsequently has had OR debridement several times of the wound performed by podiatry. Patient does have a  history of lymphedema of the lower extremities she is also a type II diabetic. 06-19-2022 upon evaluation today patient appears to be doing better in regard to the size of her leg which is significantly improved. With that being said she had a lot of drainage from the heel which is completely understandable considering what is going on at this time. There does not appear to be any evidence of active infection there is no warmth or irritation to the leg in general. With that being said the patient does have an issue here with the amount of drainage that is going on I definitely think Zetuvit is good to be the better way to go in place of ABD pads. Also think that she could potentially benefit from 3 times per week dressing changes but again right now we are going to stick with the to change it today, change in her Friday, and then subsequently seeing where things stand next Wednesday. The other option would be to change her to a different day for wound care having her come then say like a Tuesday Friday or Monday Thursday. 06-26-2022 upon evaluation patient's wound bed actually showed signs of doing well in regard to the overall size it was measuring slightly smaller. With that being said unfortunately the biggest issue we see is she still has a tremendous amount of drainage which is what could keep this from being able to use a total contact cast. For that reason I am did going to discuss with her today the possibility of trying Tubigrip to see if that could be of benefit for her. She voiced understanding and is in agreement with giving this a try. 07-03-2022 upon evaluation today patient unfortunately appears to be doing significantly worse at this point in regard to her swelling due to the fact that she was unable to really wear the  Tubigrip. She tells me that it was cutting into her. With that being said she also did have an MRI this MRI revealed that she does have osteomyelitis in the heel this was  actually just performed Monday, 25 September. This does show signs of "early osteomyelitis". Nonetheless this is still concerning to me. The wound also appears to be getting deeper in the center aspect of this which has me a little concerned as well. I do believe that she is likely going require an aggressive approach here to try to get things better. I discussed that in greater detail with her today. I will detailed in the plan. 07-17-2022 upon evaluation today patient appears to be doing poorly in regard to her wound. Unfortunately she does not show any signs of infection systemically though locally this seems to be doing worse with the wound actually being bigger than where it was previous. She did have an MRI that showed evidence of osteomyelitis actually recommended a referral to ID she was evaluated infectious disease and they recommended referral to podiatry and to be honest have recommended amputation based on what the patient tells me today. With that being said this is something that she is not interested in at all to be [name], [name] ([id number]) [id number] Page 2 of 9 perfectly honest. For that reason she wants to know what she should do and where she should go at this point that is where the majority of the conversation which was quite lengthy today went. Obviously understand her concern here but I think she is going to have to definitely get off this and likely this means she is going to need to come out of work. 10/18; Since the patient was the patient is using Hydrofera Blue on her right heel wound. She has a new larger open area superior to the wound. The skin on the bottom of her foot is completely macerated. I reviewed the infectious disease note on this patient from 10/9. They recommended keeping the patient on Delofloxacin 450 twice daily until follow-up tomorrow. The patient states she is not going back there as the only thing they wanted to do was "cut  my leg off]. She has not seen podiatry. Her lab works shows a CRP markedly elevated at 240.2 a sedimentation rate of 96 the rest of her CBC is normal creatinine of 0.38 The patient has been to see her primary doctor who is Dr. Melonie Florida [SP[facility]] In [place]. According the patient Dr. Mylinda Latina has ordered vancomycin and Zosyn to start on November 1. She is not taking the delofloxacin that was suggested by infectious disease. Very angry that they just wanted to consider her for an amputation although I do not specifically see that stated in their note 07-31-2022 upon evaluation today patient still has a significant wound over her plantar aspect of her right l foot region. With that being said I am definitely concerned about the fact that the patient probably does need to be in hyperbaric oxygen therapy at this point. I think we should try to get this going as quickly as possible. She actually is going to be set up with a PICC line next Wednesday and then following this we will have the vancomycin and Zosyn that she will be taking for 8 weeks according to what she tells me. I think this is definitely going to be beneficial and again the goal here is limb salvage. In combination with that I think the hyperbaric oxygen therapy would be ultimately significantly helpful for  her. 08-07-2022 upon evaluation today patient appears to still be doing quite poorly overall she still has not gotten her antibiotics. She got her PICC line today which I assumed she will be getting the first dose of antibiotics as well during that time. With that being said unfortunately she did not get the antibiotics and in fact as I questioned her further she does not even know when or if that is going to be started this week. Again I am very concerned about this considering she already has a PICC line in place we do not want this to clot off on top of the fact that she should already be on these antibiotics she just never went to the ER  for evaluation therefore they never got this started. At this point based on what I am seeing I really think that the ideal thing would be for [place] to see about getting her to the ER for further evaluation and treatment to see if they can get this started for her as soon as possible. The patient voiced understanding and is in agreement with the plan. I gave her recommendations for what should be done also called [facility] infusion therapy in order to see if they would be the ones that seem to be available for her IV infusions but they tell me that they no longer do this that is apparently where the orders were sent to for her infusion therapy. That was by her primary care provider. Electronic Signature(s) Signed: 08/07/2022 5:49:29 PM By: Worthy Keeler PA-C Entered By: Worthy Keeler on 08/07/2022 17:49:29 -------------------------------------------------------------------------------- Physical Exam Details Patient Name: Date of Service: YAFFA, NOCITO 08/07/2022 2:00 PM Medical Record Number: [id number] Patient Account Number: [id number] Date of Birth/Sex: Treating RN: Mar 04, 1966 (56 y.o. F) Primary Care Provider: Stoney Bang Other Clinician: Referring Provider: Treating Provider/Extender: Dallas Breeding in Treatment: 8 Constitutional Obese and well-hydrated in no acute distress. Respiratory normal breathing without difficulty. Psychiatric this patient is able to make decisions and demonstrates good insight into disease process. Alert and Oriented x 3. pleasant and cooperative. Notes Upon inspection patient's wound still not doing any better she has a significant amount currently of erythema and warmth of the lower extremity and a lot of drainage from the foot again this is much too significant to even consider a cast. She has been sitting more to try to keep pressure off her foot and now she is developing an ulcer on her gluteal region. We will continue silver alginate  here as well. Electronic Signature(s) Signed: 08/07/2022 5:49:56 PM By: Worthy Keeler PA-C Entered By: Worthy Keeler on 08/07/2022 17:49:56 Shankle, Heddy ([id number]) [id number] Page 3 of 9 -------------------------------------------------------------------------------- Physician Orders Details Patient Name: Date of Service: GIAMARIE, GARDON 08/07/2022 2:00 PM Medical Record Number: [id number] Patient Account Number: [id number] Date of Birth/Sex: Treating RN: 1966-09-07 (56 y.o. 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 - Wednesday w/ Jeri Cos Return A Other: - Ensure you continue oral antibiotics until PICC line place, then start and finish all IV antibiotics- Vancomycin and Zosyn (ordered primary care provider). Consider Hyberbaric Oxygen Therapy. Home Health to hopefully be reaching  out to you for antibiotic therapy. We will call them today as well to see Anesthetic (In clinic) Topical Lidocaine 5% applied to wound bed Bathing/ Shower/ Hygiene May shower with protection but do not get wound dressing(s) wet. Edema Control - Lymphedema / SCD / Other Elevate legs to the level of the heart or above for 30 minutes daily and/or when sitting, a frequency of: - 3-4 times a day throughout the day. Avoid standing for long periods of time. Off-Loading Open toe surgical shoe to: - w/ peg assist Other: - Minimize walking and standing as much as possible to right foot to aid in offloading to wound. While at work use the wheelchair for  mobility. Hyperbaric Oxygen Therapy Wound #1 Right Calcaneus Evaluate for HBO Therapy Indication: - Wagner Grade 3 to right calcaneus If appropriate for treatment, begin HBOT per protocol: 2.5 ATA for 90 Minutes with 2 Five (5) Minute A Breaks ir Total Number of Treatments: - 40 One treatments per day (delivered Monday through Friday unless otherwise specified in Special Instructions below): Finger stick Blood Glucose Pre- and Post- HBOT Treatment. Follow Hyperbaric Oxygen Glycemia Protocol A frin (Oxymetazoline HCL) 0.05% nasal spray - 1 spray in both nostrils daily as needed prior to HBO treatment for difficulty clearing ears Wound Treatment Wound #1 - Calcaneus Wound Laterality: Right Cleanser: Soap and Water 1 x Per Day/15 Days Discharge Instructions: May shower and wash wound with dial antibacterial soap and water prior to dressing change. Cleanser: Wound Cleanser (Generic) 1 x Per Day/15 Days Discharge Instructions: Cleanse the wound with wound cleanser prior to applying a clean dressing using gauze sponges, not tissue or cotton balls. Peri-Wound Care: Triamcinolone 15 (g) 1 x Per Day/15 Days Discharge Instructions: Use triamcinolone 15 (g) as directed Peri-Wound Care: Zinc Oxide Ointment 30g tube 1 x Per Day/15 Days Discharge Instructions: Apply Zinc Oxide to periwound with each dressing change Peri-Wound Care: Sween Lotion (Moisturizing lotion) 1 x Per Day/15 Days Discharge Instructions: Apply moisturizing lotion as directed Prim Dressing: KerraCel Ag Gelling Fiber Dressing, 4x5 in (silver alginate) (DME) (Generic) 1 x Per Day/15 Days ary Discharge Instructions: Apply silver alginate to wound bed as instructed Secondary Dressing: ABD Pad, 5x9 (Generic) 1 x Per Day/15 Days Discharge Instructions: Apply over primary dressing as directed. Secondary Dressing: Drawtex 4x4 in 1 x Per Day/15 Days Discharge Instructions: Apply over primary dressing as directed. Larch, [name]  ([id number]) [id number] Page 4 of 9 Secondary Dressing: Woven Gauze Sponge, Non-Sterile 4x4 in (Generic) 1 x Per Day/15 Days Discharge Instructions: Apply over primary dressing as directed. Secured With: Elastic Bandage 4 inch (ACE bandage) (Generic) 1 x Per Day/15 Days Discharge Instructions: Secure with ACE bandage as directed. Secured With: [employer], 4.5x3.1 (in/yd) (Generic) 1 x Per Day/15 Days Discharge Instructions: Secure with Kerlix as directed. Secured With: 37M Medipore H Soft Cloth Surgical T ape, 4 x 10 (in/yd) (Generic) 1 x Per Day/15 Days Discharge Instructions: Secure with tape as directed. Wound #2 - T Second oe Wound Laterality: Right Cleanser: Soap and Water 1 x Per Day/15 Days Discharge Instructions: May shower and wash wound with dial antibacterial soap and water prior to dressing change. Cleanser: Wound Cleanser (Generic) 1 x Per Day/15 Days Discharge Instructions: Cleanse the wound with wound cleanser prior to applying a clean dressing using gauze sponges, not tissue or cotton balls. Peri-Wound Care: Triamcinolone 15 (g) 1 x Per Day/15 Days Discharge Instructions: Use triamcinolone 15 (g) as directed Peri-Wound Care: Zinc Oxide Ointment 30g  tube 1 x Per Day/15 Days Discharge Instructions: Apply Zinc Oxide to periwound with each dressing change Peri-Wound Care: Sween Lotion (Moisturizing lotion) 1 x Per Day/15 Days Discharge Instructions: Apply moisturizing lotion as directed Prim Dressing: KerraCel Ag Gelling Fiber Dressing, 4x5 in (silver alginate) (DME) (Generic) 1 x Per Day/15 Days ary Discharge Instructions: Apply silver alginate to wound bed as instructed Secondary Dressing: Woven Gauze Sponge, Non-Sterile 4x4 in (Generic) 1 x Per Day/15 Days Discharge Instructions: Apply over primary dressing as directed. Secured With: 84M Medipore H Soft Cloth Surgical T ape, 4 x 10 (in/yd) (DME) (Generic) 1 x Per Day/15 Days Discharge  Instructions: Secure with tape as directed. Wound #3 - Gluteus Wound Laterality: Left Peri-Wound Care: Skin Prep (DME) (Generic) 1 x Per Day/15 Days Discharge Instructions: Use skin prep as directed Prim Dressing: KerraCel Ag Gelling Fiber Dressing, 4x5 in (silver alginate) (DME) (Generic) 1 x Per Day/15 Days ary Discharge Instructions: Apply silver alginate to wound bed as instructed Secondary Dressing: Zetuvit Plus Silicone Border Dressing 4x4 (in/in) (DME) (Generic) 1 x Per Day/15 Days Discharge Instructions: Apply silicone border over primary dressing as directed. GLYCEMIA INTERVENTIONS PROTOCOL PRE-HBO GLYCEMIA INTERVENTIONS ACTION INTERVENTION Obtain pre-HBO capillary blood glucose (ensure 1 physician order is in chart). A. Notify HBO physician and await physician orders. 2 If result is 70 mg/dl or below: B. If the result meets the hospital definition of a critical result, follow hospital policy. A. Give patient an 8 ounce Glucerna Shake, an 8 ounce Ensure, or 8 ounces of a Glucerna/Ensure equivalent dietary supplement*. B. Wait 30 minutes. If result is 71 mg/dl to 130 mg/dl: C. Retest patients capillary blood glucose (CBG). D. If result greater than or equal to 110 mg/dl, proceed with HBO. If result less than 110 mg/dl, notify HBO physician and consider holding HBO. If result is 131 mg/dl to 249 mg/dl: A. Proceed with HBO. A. Notify HBO physician and await physician orders. B. It is recommended to hold HBO and do If result is 250 mg/dl or greater: blood/urine ketone testing. C. If the result meets the hospital definition of a critical result, follow hospital policy. POST-HBO GLYCEMIA INTERVENTIONS ACTION INTERVENTION Obtain post HBO capillary blood glucose (ensure 1 Boutin, Jalaiyah ([id number]) [id number] Page 5 of 9 1 physician order is in chart). A. Notify HBO physician and await physician orders. 2 If result is 70 mg/dl or  below: B. If the result meets the hospital definition of a critical result, follow hospital policy. A. Give patient an 8 ounce Glucerna Shake, an 8 ounce Ensure, or 8 ounces of a Glucerna/Ensure equivalent dietary supplement*. B. Wait 15 minutes for symptoms of If result is 71 mg/dl to 100 mg/dl: hypoglycemia (i.e. nervousness, anxiety, sweating, chills, clamminess, irritability, confusion, tachycardia or dizziness). C. If patient asymptomatic, discharge patient. If patient symptomatic, repeat capillary blood glucose (CBG) and notify HBO physician. If result is 101 mg/dl to 249 mg/dl: A. Discharge patient. A. Notify HBO physician and await physician orders. B. It is recommended to do blood/urine ketone If result is 250 mg/dl or greater: testing. C. If the result meets the hospital definition of a critical result, follow hospital policy. *Juice or candies are NOT equivalent products. If patient refuses the Glucerna or Ensure, please consult the hospital dietitian for an appropriate substitute. Electronic Signature(s) Signed: 08/07/2022 6:14:51 PM By: Worthy Keeler PA-C Signed: 08/13/2022 3:49:22 PM By: Rhae Hammock RN Entered By: Rhae Hammock on 08/07/2022 15:14:30 -------------------------------------------------------------------------------- Problem List Details Patient Name: Date of Service: Langelier,  Heavenly 08/07/2022 2:00 PM Medical Record Number: [id number] Patient Account Number: [id number] Date of Birth/Sex: Treating RN: 1966/06/29 (56 y.o. 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 06/12/2022 No Yes I89.0 Lymphedema, not  elsewhere classified 06/12/2022 No Yes Inactive Problems Resolved Problems Electronic Signature(s) Signed: 08/07/2022 2:07:41 PM By: Worthy Keeler PA-C Entered By: Worthy Keeler on 08/07/2022 14:07:40 Weinfeld, Natalye ([id number]) [id number] Page 6 of 9 -------------------------------------------------------------------------------- Progress Note Details Patient Name: Date of Service: BRENETTA, ABERCROMBIE 08/07/2022 2:00 PM Medical Record Number: [id number] Patient Account Number: [id number] Date of Birth/Sex: Treating RN: Jun 05, 1966 (56 y.o. F) Primary Care Provider: Stoney Bang Other Clinician: Referring Provider: Treating Provider/Extender: Dallas Breeding in Treatment: 8 Subjective Chief Complaint Information obtained from Patient Right heel ulcer History of Present Illness (HPI) 06-13-2022 upon evaluation today patient presents for evaluation of her right heel ulcer. She is having a tremendous amount of pain at this location. She tells me that her most recent hemoglobin A1c was 8.7 and that was on 08-23-2021. This ulcer on the heel has been present she states since February 2023 when she had an injection to the heel by podiatry. She states that she began to have increasing pain the wound opened and has been open ever since. 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 She had an x-ray in June if anything can although there did not appear to be any obvious evidence of osteomyelitis at that point. She subsequently has had OR debridement several times of the wound performed by podiatry. Patient does have a history of lymphedema of the lower extremities she is also a type II diabetic. 06-19-2022 upon evaluation today patient appears to be doing better in regard to the size of her leg which is significantly improved. With that being said she had a lot of drainage from the heel which is completely understandable considering what  is going on at this time. There does not appear to be any evidence of active infection there is no warmth or irritation to the leg in general. With that being said the patient does have an issue here with the amount of drainage that is going on I definitely think Zetuvit is good to be the better way to go in place of ABD pads. Also think that she could potentially benefit from 3 times per week dressing changes but again right now we are going to stick with the to change it today, change in her Friday, and then subsequently seeing where things stand next Wednesday. The other option would be to change her to a different day for wound care having her come then say like a Tuesday Friday or Monday Thursday. 06-26-2022 upon evaluation patient's wound bed actually showed signs of doing well in regard to the overall size it was measuring slightly smaller. With that being said unfortunately the biggest issue we see is she still has a tremendous amount of drainage which is what could keep this from being able to use a total contact cast. For that reason I am did going to discuss with her today the possibility of trying Tubigrip to see if that could be of benefit for her. She voiced understanding and is in agreement  with giving this a try. 07-03-2022 upon evaluation today patient unfortunately appears to be doing significantly worse at this point in regard to her swelling due to the fact that she was unable to really wear the Tubigrip. She tells me that it was cutting into her. With that being said she also did have an MRI this MRI revealed that she does have osteomyelitis in the heel this was actually just performed Monday, 25 September. This does show signs of "early osteomyelitis". Nonetheless this is still concerning to me. The wound also appears to be getting deeper in the center aspect of this which has me a little concerned as well. I do believe that she is likely going require an aggressive approach here to  try to get things better. I discussed that in greater detail with her today. I will detailed in the plan. 07-17-2022 upon evaluation today patient appears to be doing poorly in regard to her wound. Unfortunately she does not show any signs of infection systemically though locally this seems to be doing worse with the wound actually being bigger than where it was previous. She did have an MRI that showed evidence of osteomyelitis actually recommended a referral to ID she was evaluated infectious disease and they recommended referral to podiatry and to be honest have recommended amputation based on what the patient tells me today. With that being said this is something that she is not interested in at all to be perfectly honest. For that reason she wants to know what she should do and where she should go at this point that is where the majority of the conversation which was quite lengthy today went. Obviously understand her concern here but I think she is going to have to definitely get off this and likely this means she is going to need to come out of work. 10/18; Since the patient was the patient is using Hydrofera Blue on her right heel wound. She has a new larger open area superior to the wound. The skin on the bottom of her foot is completely macerated. I reviewed the infectious disease note on this patient from 10/9. They recommended keeping the patient on Delofloxacin 450 twice daily until follow-up tomorrow. The patient states she is not going back there as the only thing they wanted to do was "cut my leg off]. She has not seen podiatry. Her lab works shows a CRP markedly elevated at 240.2 a sedimentation rate of 96 the rest of her CBC is normal creatinine of 0.38 The patient has been to see her primary doctor who is Dr. Melonie Florida [[facility]] In [place]. According the patient Dr. Mylinda Latina has ordered vancomycin and Zosyn to start on November 1. She is not taking the delofloxacin that was suggested by  infectious disease. Very angry that they just wanted to consider her for an amputation although I do not specifically see that stated in their note 07-31-2022 upon evaluation today patient still has a significant wound over her plantar aspect of her right l foot region. With that being said I am definitely concerned about the fact that the patient probably does need to be in hyperbaric oxygen therapy at this point. I think we should try to get this going as quickly as possible. She actually is going to be set up with a PICC line next Wednesday and then following this we will have the vancomycin and Zosyn that she will be taking for 8 weeks according to what she tells me. I think this is  definitely going to be beneficial and again the goal here is limb salvage. In combination with that I think the hyperbaric oxygen therapy would be ultimately significantly helpful for her. 08-07-2022 upon evaluation today patient appears to still be doing quite poorly overall she still has not gotten her antibiotics. She got her PICC line today which I assumed she will be getting the first dose of antibiotics as well during that time. With that being said unfortunately she did not get the antibiotics and in fact as I questioned her further she does not even know when or if that is going to be started this week. Again I am very concerned about this considering she already has a PICC line in place we do not want this to clot off on top of the fact that she should already be on these antibiotics she just never went to the ER for evaluation therefore they never got this started. At this point based on what I am seeing I really think that the ideal thing would be for [place] to see about getting her to the ER for further evaluation and treatment to see if they can get this started for her as soon as possible. The patient voiced understanding and is in agreement with the plan. I gave her recommendations for what should be done also  called [facility] in order to see if they would be the ones that seem to be available for her IV infusions but they tell me that they no longer do this that is apparently where the orders were sent to for her infusion therapy. That was by her primary care provider. Pieratt, [name] ([id number]) [id number] Page 7 of 9 Objective Constitutional Obese and well-hydrated in no acute distress. Vitals Time Taken: 2:30 PM, Height: 65 in, Weight: 331 lbs, BMI: 55.1, Temperature: 99 F, Pulse: 114 bpm, Respiratory Rate: 20 breaths/min, Blood Pressure: 124/80 mmHg. Respiratory normal breathing without difficulty. Psychiatric this patient is able to make decisions and demonstrates good insight into disease process. Alert and Oriented x 3. pleasant and cooperative. General Notes: Upon inspection patient's wound still not doing any better she has a significant amount currently of erythema and warmth of the lower extremity and a lot of drainage from the foot again this is much too significant to even consider a cast. She has been sitting more to try to keep pressure off her foot and now she is developing an ulcer on her gluteal region. We will continue silver alginate here as well. Integumentary (Hair, Skin) Wound #1 status is Open. Original cause of wound was Gradually Appeared. The date acquired was: 11/07/2020. The wound has been in treatment 8 weeks. The wound is located on the Right Calcaneus. The wound measures 10.4cm length x 4.9cm width x 1cm depth; 40.024cm^2 area and 40.024cm^3 volume. There is Fat Layer (Subcutaneous Tissue) exposed. There is no tunneling or undermining noted. There is a large amount of serosanguineous drainage noted. Foul odor after cleansing was noted. The wound margin is distinct with the outline attached to the wound base. There is large (67-100%) red, pink granulation within the wound bed. There is a small (1-33%) amount of necrotic tissue  within the wound bed including Adherent Slough. The periwound skin appearance exhibited: Maceration, Erythema. The periwound skin appearance did not exhibit: Callus, Crepitus, Excoriation, Induration, Rash, Scarring, Dry/Scaly, Atrophie Blanche, Cyanosis, Ecchymosis, Hemosiderin Staining, Mottled, Pallor, Rubor. The surrounding wound skin color is noted with erythema which is circumferential. Periwound temperature was noted as No  Abnormality. The periwound has tenderness on palpation. Wound #2 status is Open. Original cause of wound was Gradually Appeared. The date acquired was: 07/17/2022. The wound has been in treatment 3 weeks. The wound is located on the Right T Second. The wound measures 0.4cm length x 0.5cm width x 0.1cm depth; 0.157cm^2 area and 0.016cm^3 volume. There is oe Fat Layer (Subcutaneous Tissue) exposed. There is no tunneling or undermining noted. There is a medium amount of serosanguineous drainage noted. The wound margin is distinct with the outline attached to the wound base. There is medium (34-66%) granulation within the wound bed. There is a medium (34-66%) amount of necrotic tissue within the wound bed including Adherent Slough. The periwound skin appearance exhibited: Dry/Scaly. The periwound skin appearance did not exhibit: Callus, Crepitus, Excoriation, Induration, Rash, Scarring, Maceration, Atrophie Blanche, Cyanosis, Ecchymosis, Hemosiderin Staining, Mottled, Pallor, Rubor, Erythema. Periwound temperature was noted as No Abnormality. Wound #3 status is Open. Original cause of wound was Gradually Appeared. The date acquired was: 07/31/2022. The wound is located on the Left Gluteus. The wound measures 0.9cm length x 1cm width x 0.1cm depth; 0.707cm^2 area and 0.071cm^3 volume. There is Fat Layer (Subcutaneous Tissue) exposed. There is no tunneling or undermining noted. There is a medium amount of serosanguineous drainage noted. The wound margin is distinct with the outline  attached to the wound base. There is a large (67-100%) amount of necrotic tissue within the wound bed including Adherent Slough. The periwound skin appearance did not exhibit: Callus, Crepitus, Excoriation, Induration, Rash, Scarring, Dry/Scaly, Maceration, Atrophie Blanche, Cyanosis, Ecchymosis, Hemosiderin Staining, Mottled, Pallor, Rubor, Erythema. Assessment Active Problems ICD-10 Type 2 diabetes mellitus with foot ulcer Non-pressure chronic ulcer of right heel and midfoot with fat layer exposed Lymphedema, not elsewhere classified Plan Follow-up Appointments: Return Appointment in 1 week. - Wednesday w/ Jeri Cos Other: - Ensure you continue oral antibiotics until PICC line place, then start and finish all IV antibiotics- Vancomycin and Zosyn (ordered primary care provider). Consider Hyberbaric Oxygen Therapy. Home Health to hopefully be reaching out to you for antibiotic therapy. We will call them today as well to see Anesthetic: (In clinic) Topical Lidocaine 5% applied to wound bed Bathing/ Shower/ Hygiene: May shower with protection but do not get wound dressing(s) wet. Edema Control - Lymphedema / SCD / Other: Elevate legs to the level of the heart or above for 30 minutes daily and/or when sitting, a frequency of: - 3-4 times a day throughout the day. Avoid standing for long periods of time. Off-Loading: Open toe surgical shoe to: - w/ peg assist Other: - Minimize walking and standing as much as possible to right foot to aid in offloading to wound. While at work use the wheelchair for mobility. Hyperbaric Oxygen Therapy: Wound #1 Right Calcaneus: Evaluate for HBO Therapy Grime, Alinah ([id number]) [id number] Page 8 of 9 Indication: - Wagner Grade 3 to right calcaneus If appropriate for treatment, begin HBOT per protocol: 2.5 ATA for 90 Minutes with 2 Five (5) Minute Air Breaks T Number of Treatments: - 40 otal One treatments per day (delivered  Monday through Friday unless otherwise specified in Special Instructions below): Finger stick Blood Glucose Pre- and Post- HBOT Treatment. Follow Hyperbaric Oxygen Glycemia Protocol Afrin (Oxymetazoline HCL) 0.05% nasal spray - 1 spray in both nostrils daily as needed prior to HBO treatment for difficulty clearing ears WOUND #1: - Calcaneus Wound Laterality: Right Cleanser: Soap and Water 1 x Per Day/15 Days Discharge Instructions: May shower and wash wound with  dial antibacterial soap and water prior to dressing change. Cleanser: Wound Cleanser (Generic) 1 x Per Day/15 Days Discharge Instructions: Cleanse the wound with wound cleanser prior to applying a clean dressing using gauze sponges, not tissue or cotton balls. Peri-Wound Care: Triamcinolone 15 (g) 1 x Per Day/15 Days Discharge Instructions: Use triamcinolone 15 (g) as directed Peri-Wound Care: Zinc Oxide Ointment 30g tube 1 x Per Day/15 Days Discharge Instructions: Apply Zinc Oxide to periwound with each dressing change Peri-Wound Care: Sween Lotion (Moisturizing lotion) 1 x Per Day/15 Days Discharge Instructions: Apply moisturizing lotion as directed Prim Dressing: KerraCel Ag Gelling Fiber Dressing, 4x5 in (silver alginate) (DME) (Generic) 1 x Per Day/15 Days ary Discharge Instructions: Apply silver alginate to wound bed as instructed Secondary Dressing: ABD Pad, 5x9 (Generic) 1 x Per Day/15 Days Discharge Instructions: Apply over primary dressing as directed. Secondary Dressing: Drawtex 4x4 in 1 x Per Day/15 Days Discharge Instructions: Apply over primary dressing as directed. Secondary Dressing: Woven Gauze Sponge, Non-Sterile 4x4 in (Generic) 1 x Per Day/15 Days Discharge Instructions: Apply over primary dressing as directed. Secured With: Elastic Bandage 4 inch (ACE bandage) (Generic) 1 x Per Day/15 Days Discharge Instructions: Secure with ACE bandage as directed. Secured With: [employer], 4.5x3.1 (in/yd) (Generic) 1  x Per Day/15 Days Discharge Instructions: Secure with Kerlix as directed. Secured With: 28M Medipore H Soft Cloth Surgical T ape, 4 x 10 (in/yd) (Generic) 1 x Per Day/15 Days Discharge Instructions: Secure with tape as directed. WOUND #2: - T Second Wound Laterality: Right oe Cleanser: Soap and Water 1 x Per [id number] Days Discharge Instructions: May shower and wash wound with dial antibacterial soap and water prior to dressing change. Cleanser: Wound Cleanser (Generic) 1 x Per Day/15 Days Discharge Instructions: Cleanse the wound with wound cleanser prior to applying a clean dressing using gauze sponges, not tissue or cotton balls. Peri-Wound Care: Triamcinolone 15 (g) 1 x Per Day/15 Days Discharge Instructions: Use triamcinolone 15 (g) as directed Peri-Wound Care: Zinc Oxide Ointment 30g tube 1 x Per Day/15 Days Discharge Instructions: Apply Zinc Oxide to periwound with each dressing change Peri-Wound Care: Sween Lotion (Moisturizing lotion) 1 x Per Day/15 Days Discharge Instructions: Apply moisturizing lotion as directed Prim Dressing: KerraCel Ag Gelling Fiber Dressing, 4x5 in (silver alginate) (DME) (Generic) 1 x Per Day/15 Days ary Discharge Instructions: Apply silver alginate to wound bed as instructed Secondary Dressing: Woven Gauze Sponge, Non-Sterile 4x4 in (Generic) 1 x Per Day/15 Days Discharge Instructions: Apply over primary dressing as directed. Secured With: 28M Medipore H Soft Cloth Surgical T ape, 4 x 10 (in/yd) (DME) (Generic) 1 x Per Day/15 Days Discharge Instructions: Secure with tape as directed. WOUND #3: - Gluteus Wound Laterality: Left Peri-Wound Care: Skin Prep (DME) (Generic) 1 x Per Day/15 Days Discharge Instructions: Use skin prep as directed Prim Dressing: KerraCel Ag Gelling Fiber Dressing, 4x5 in (silver alginate) (DME) (Generic) 1 x Per Day/15 Days ary Discharge Instructions: Apply silver alginate to wound bed as instructed Secondary Dressing: Zetuvit Plus  Silicone Border Dressing 4x4 (in/in) (DME) (Generic) 1 x Per Day/15 Days Discharge Instructions: Apply silicone border over primary dressing as directed. 1. I spent a significant amount of time today trying to track down where the patient was going to be receiving the vancomycin and Zosyn from as far as the IV infusion company. With that being said where this was sent to by her primary care provider's office did not have any record of this and states they  no longer do this that was [facility]. Subsequently and unfortunately the only nurse in the office apparently at her primary care provider's office was not available is only a front desk staff and to be honest she did not really know much about this. She was trying to help as much as she could which I appreciated nonetheless a very frustrating situation in general. she voiced understanding and I think that is what she is going to do. This was all discussed with the patient with her son today as well. 2. Based on the fact that we are having trouble figuring out where she is able to be given IV antibiotics from and I really do not want to delay this any further as we thought today was a day that we are pushing forward to in order to get the started I really think she should go to the hospital and see if they can get this started for her. Obviously she has the PICC line and so they can get things set up through outpatient infusion therapy and that would be the way to go. 3. I do believe that she also needs to continue to consider the fact that I really believe she is to be out of work. I think being work is actually a detriment to her health at this point now that she is sitting more at work she is actually causing more damage to the gluteal region and she is starting to have breakdown there as well. We will see patient back for reevaluation in 1 week here in the clinic. If anything worsens or changes patient will contact our office for  additional recommendations. Electronic Signature(s) Signed: 08/07/2022 5:57:19 PM By: Worthy Keeler PA-C Entered By: Worthy Keeler on 08/07/2022 17:57:19 Correnti, Tabrina ([id number]) [id number] Page 9 of 9 -------------------------------------------------------------------------------- SuperBill Details Patient Name: Date of Service: KENIDI, INSLEY 08/07/2022 Medical Record Number: [id number] Patient Account Number: [id number] Date of Birth/Sex: Treating RN: [birth year] (56 y.o. [name], [name] Primary Care Provider: Stoney Bang Other Clinician: Referring Provider: Treating Provider/Extender: Dallas Breeding in Treatment: 8 Diagnosis Coding ICD-10 Codes Code Description [phone] Type 2 diabetes mellitus with foot ulcer L97.412 Non-pressure chronic ulcer of right heel and midfoot with fat layer exposed I89.0 Lymphedema, not elsewhere classified Facility Procedures : CPT4 Code: [id number] Description: [id number] - WOUND CARE VISIT-LEV 5 EST PT Modifier: Quantity: 1 Physician Procedures : CPT4 Code Description Modifier [id number] - WC PHYS LEVEL 4 - EST PT ICD-10 Diagnosis Description E11.621 Type 2 diabetes mellitus with foot ulcer L97.412 Non-pressure chronic ulcer of right heel and midfoot with fat layer exposed I89.0 Lymphedema,  not elsewhere classified Quantity: 1 Electronic Signature(s) Signed: 08/07/2022 5:57:56 PM By: Worthy Keeler PA-C Entered By: Worthy Keeler on 08/07/2022 17:57:56

## 2022-08-13 NOTE — Progress Notes (Signed)
Brining, [name] ([id number]) [id number] Page 1 of 11 Visit Report for 08/07/2022 Arrival Information Details Patient Name: Date of Service: [name], [name] 08/07/2022 2:00 PM Medical Record Number: [id number] Patient Account Number: [id number] Date of Birth/Sex: Treating RN: 1966-05-01 (56 y.o. [name], Millard.Loa Primary Care Landan Fedie: [name] Other Clinician: Referring Jonathyn Carothers: Treating Ogle Hoeffner/Extender: [name] in Treatment: 8 Visit Information History Since Last Visit Added or deleted any medications: Yes Patient Arrived: Ambulatory Any new allergies or adverse reactions: No Arrival Time: 14:40 Had a fall or experienced change in No Accompanied By: son activities of daily living that may affect Transfer Assistance: None risk of falls: Patient Identification Verified: Yes Signs or symptoms of abuse/neglect since last visito No Secondary Verification Process Completed: Yes Hospitalized since last visit: No Patient Requires Transmission-Based Precautions: No Implantable device outside of the clinic excluding No Patient Has Alerts: No cellular tissue based products placed in the center since last visit: Has Dressing in Place as Prescribed: Yes Pain Present Now: Yes Notes PICC line placed today. Electronic Signature(s) Signed: 08/09/2022 5:25:29 PM By: Shawn Stall RN, BSN Entered By: Shawn Stall on 08/07/2022 14:41:16 -------------------------------------------------------------------------------- Clinic Level of Care Assessment Details Patient Name: Date of Service: [name] 08/07/2022 2:00 PM Medical Record Number: [id number] Patient Account Number: [id number] Date of Birth/Sex: Treating RN: 11/09/1965 (56 y.o. [name], [name] Primary Care Lorilee Cafarella: [name] Other Clinician: Referring Laiylah Roettger: Treating Alexa Golebiewski/Extender: [name] in Treatment: 8 Clinic Level of Care Assessment  Items TOOL 4 Quantity Score X- 1 0 Use when only an EandM is performed on FOLLOW-UP visit ASSESSMENTS - Nursing Assessment / Reassessment X- 1 10 Reassessment of Co-morbidities (includes updates in patient status) X- 1 5 Reassessment of Adherence to Treatment Plan ASSESSMENTS - Wound and Skin A ssessment / Reassessment []  - 0 Simple Wound Assessment / Reassessment - one wound X- 3 5 Complex Wound Assessment / Reassessment - multiple wounds []  - 0 Dermatologic / Skin Assessment (not related to wound area) ASSESSMENTS - Focused Assessment X- 1 5 Circumferential Edema Measurements - multi extremities [name], [name] ( ) [id number] Page 2 of 11 []  - 0 Nutritional Assessment / Counseling / Intervention []  - 0 Lower Extremity Assessment (monofilament, tuning fork, pulses) []  - 0 Peripheral Arterial Disease Assessment (using hand held doppler) ASSESSMENTS - Ostomy and/or Continence Assessment and Care []  - 0 Incontinence Assessment and Management []  - 0 Ostomy Care Assessment and Management (repouching, etc.) PROCESS - Coordination of Care []  - 0 Simple Patient / Family Education for ongoing care []  - 0 Complex (extensive) Patient / Family Education for ongoing care X- 1 10 Staff obtains [id number], Records, T Results / Process Orders est X- 1 10 Staff telephones HHA, Nursing Homes / Clarify orders / etc []  - 0 Routine Transfer to another Facility (non-emergent condition) []  - 0 Routine Hospital Admission (non-emergent condition) []  - 0 New Admissions / 01-12-1972 / Ordering NPWT Apligraf, etc. , []  - 0 Emergency Hospital Admission (emergent condition) []  - 0 Simple Discharge Coordination X- 1 15 Complex (extensive) Discharge Coordination PROCESS - Special Needs []  - 0 Pediatric / Minor Patient Management []  - 0 Isolation Patient Management []  - 0 Hearing / Language / Visual special needs []  - 0 Assessment of Community  assistance (transportation, D/C planning, etc.) []  - 0 Additional assistance / Altered mentation []  - 0 Support Surface(s) Assessment (bed, cushion, seat, etc.) INTERVENTIONS - Wound Cleansing / Measurement []  - 0 Simple Wound Cleansing -  one wound X- 3 5 Complex Wound Cleansing - multiple wounds X- 1 5 Wound Imaging (photographs - any number of wounds) []  - 0 Wound Tracing (instead of photographs) []  - 0 Simple Wound Measurement - one wound X- 3 5 Complex Wound Measurement - multiple wounds INTERVENTIONS - Wound Dressings []  - 0 Small Wound Dressing one or multiple wounds X- 3 15 Medium Wound Dressing one or multiple wounds []  - 0 Large Wound Dressing one or multiple wounds X- 1 5 Application of Medications - topical []  - 0 Application of Medications - injection INTERVENTIONS - Miscellaneous []  - 0 External ear exam []  - 0 Specimen Collection (cultures, biopsies, blood, body fluids, etc.) []  - 0 Specimen(s) / Culture(s) sent or taken to Lab for analysis []  - 0 Patient Transfer (multiple staff / Harrel Lemon Lift / Similar devices) []  - 0 Simple Staple / Suture removal (25 or less) []  - 0 Complex Staple / Suture removal (26 or more) Geeslin, Lynesha ([id number]) [id number] Page 3 of 11 []  - 0 Hypo / Hyperglycemic Management (close monitor of Blood Glucose) []  - 0 Ankle / Brachial Index (ABI) - do not check if billed separately X- 1 5 Vital Signs Has the patient been seen at the hospital within the last three years: Yes Total Score: 160 Level Of Care: New/Established - Level 5 Electronic Signature(s) Signed: 08/13/2022 3:49:22 PM By: Rhae Hammock RN Entered By: Rhae Hammock on 08/07/2022 15:20:54 -------------------------------------------------------------------------------- Encounter Discharge Information Details Patient Name: Date of Service: [name], [name] 08/07/2022 2:00 PM Medical Record Number: [id number] Patient Account Number:  [id number] Date of Birth/Sex: Treating RN: 1966/05/18 (55 y.o. [name], [name] Primary Care Jaquanda Wickersham: Stoney Bang Other Clinician: Referring Anina Schnake: Treating Iszabella Hebenstreit/Extender: Dallas Breeding in Treatment: 8 Encounter Discharge Information Items Discharge Condition: Stable Ambulatory Status: Wheelchair Discharge Destination: Home Transportation: Private Auto Accompanied By: son Schedule Follow-up Appointment: Yes Clinical Summary of Care: Patient Declined Electronic Signature(s) Signed: 08/13/2022 3:49:22 PM By: Rhae Hammock RN Entered By: Rhae Hammock on 08/07/2022 15:21:35 -------------------------------------------------------------------------------- Lower Extremity Assessment Details Patient Name: Date of Service: TAMECCA, ARTIGA 08/07/2022 2:00 PM Medical Record Number: [id number] Patient Account Number: [id number] Date of Birth/Sex: Treating RN: April 12, 1966 (56 y.o. Debby Bud Primary Care Danity Schmelzer: Stoney Bang Other Clinician: Referring Ulyses Panico: Treating Ulysess Witz/Extender: Matt Holmes Weeks in Treatment: 8 Edema Assessment Assessed: [Left: No] [Right: Yes] Edema: [Left: Ye] [Right: s] Calf Left: Right: Point of Measurement: 32 cm From Medial Instep 64 cm Ankle Left: Right: Point of Measurement: 10 cm From Medial Instep 37 cm Loftus, Andreya ([id number]) [id number] Page 4 of 11 Vascular Assessment Pulses: Dorsalis Pedis Palpable: [Right:Yes] Electronic Signature(s) Signed: 08/09/2022 5:25:29 PM By: Deon Pilling RN, BSN Entered By: Deon Pilling on 08/07/2022 14:42:11 -------------------------------------------------------------------------------- [place] Details Patient Name: Date of Service: MICHEAL, SHEEN 08/07/2022 2:00 PM Medical Record Number: [id number] Patient Account Number: [id number] Date of Birth/Sex: Treating RN: Feb 04, 1966 (56 y.o. [name],  [name] Primary Care Tarsha Blando: Stoney Bang Other Clinician: Referring Amity Roes: Treating Kayl Stogdill/Extender: Dallas Breeding in Treatment: 8 Active Inactive Wound/Skin Impairment Nursing Diagnoses: Impaired tissue integrity Knowledge deficit related to ulceration/compromised skin integrity Goals: Patient will have a decrease in wound volume by X% from date: (specify in notes) Date Initiated: 06/12/2022 Target Resolution Date: 08/30/2022 Goal Status: Active Patient/caregiver will verbalize understanding of skin care regimen Date Initiated: 06/12/2022 Target Resolution Date: 08/03/2022 Goal Status: Active Ulcer/skin breakdown will have a volume reduction of 30% by week 4  Date Initiated: 06/12/2022 Date Inactivated: 07/31/2022 Target Resolution Date: 08/03/2022 Goal Status: Unmet Unmet Reason: dx with osteomyelitis. Ulcer/skin breakdown will have a volume reduction of 50% by week 8 Date Initiated: 06/12/2022 Target Resolution Date: 08/07/2022 Goal Status: Active Interventions: Assess patient/caregiver ability to obtain necessary supplies Assess patient/caregiver ability to perform ulcer/skin care regimen upon admission and as needed Assess ulceration(s) every visit Notes: Electronic Signature(s) Signed: 08/13/2022 3:49:22 PM By: Fonnie Mu RN Entered By: Fonnie Mu on 08/07/2022 15:04:34 -------------------------------------------------------------------------------- Pain Assessment Details Patient Name: Date of Service: Metheney, Mailin 08/07/2022 2:00 PM Medical Record Number: [id number] Patient Account Number: [id number] LUTIE, PICKLER ([id number]) [id number] Page 5 of 11 Date of Birth/Sex: Treating RN: 1966-05-16 (56 y.o. [name] Primary Care Chiron Campione: Other Clinician: [name] Referring Brylyn Novakovich: Treating Fay Swider/Extender: [name] in Treatment: 8 Active Problems Location of Pain  Severity and Description of Pain Patient Has Paino Yes Site Locations Pain Location: Pain in Ulcers Rate the pain. Current Pain Level: 8 Pain Management and Medication Current Pain Management: Medication: No Cold Application: No Rest: No Massage: No Activity: No T.E.N.S.: No Heat Application: No Leg drop or elevation: No Is the Current Pain Management Adequate: Adequate How does your wound impact your activities of daily livingo Sleep: No Bathing: No Appetite: No Relationship With Others: No Bladder Continence: No Emotions: No Bowel Continence: No Work: No Toileting: No Drive: No Dressing: No Hobbies: No Psychologist, prison and probation services) Signed: 08/09/2022 5:25:29 PM By: Shawn Stall RN, BSN Entered By: Shawn Stall on 08/07/2022 14:42:04 -------------------------------------------------------------------------------- Patient/Caregiver Education Details Patient Name: Date of Service: Darral Dash 11/1/2023andnbsp2:00 PM Medical Record Number: [id number] Patient Account Number: [id number] Date of Birth/Gender: Treating RN: May 17, 1966 (56 y.o. [name], [name] Primary Care Physician: [name] Other Clinician: Referring Physician: Treating Physician/Extender: [name] in Treatment: 8 Education Assessment Education Provided To: Patient Education Topics Provided Wound/Skin Impairment: Methods: Explain/Verbal Pribble, Sparrow ([id number]) [id number] Page 6 of 11 Responses: Reinforcements needed, State content correctly Electronic Signature(s) Signed: 08/13/2022 3:49:22 PM By: Fonnie Mu RN Entered By: Fonnie Mu on 08/07/2022 15:04:47 -------------------------------------------------------------------------------- Wound Assessment Details Patient Name: Date of Service: GLENNIE, BOSE 08/07/2022 2:00 PM Medical Record Number: [id number] Patient Account Number: [id number] Date of Birth/Sex: Treating  RN: 1966/05/29 (56 y.o. [name] Primary Care Aziah Kaiser: [name] Other Clinician: Referring Talar Fraley: Treating Illa Enlow/Extender: [name] in Treatment: 8 Wound Status Wound Number: 1 Primary Etiology: Diabetic Wound/Ulcer of the Lower Extremity Wound Location: Right Calcaneus Wound Status: Open Wounding Event: Gradually Appeared Comorbid History: Hypertension, Type II Diabetes, Osteomyelitis Date Acquired: 11/07/2020 Weeks Of Treatment: 8 Clustered Wound: No Photos Wound Measurements Length: (cm) 10.4 Width: (cm) 4.9 Depth: (cm) 1 Area: (cm) 40.024 Volume: (cm) 40.024 % Reduction in Area: -244.3% % Reduction in Volume: -1621.5% Epithelialization: None Tunneling: No Undermining: No Wound Description Classification: Grade 3 Wound Margin: Distinct, outline attached Exudate Amount: Large Exudate Type: Serosanguineous Exudate Color: red, brown Foul Odor After Cleansing: Yes Due to Product Use: No Slough/Fibrino Yes Wound Bed Granulation Amount: Large (67-100%) Exposed Structure Granulation Quality: Red, Pink Fascia Exposed: No Necrotic Amount: Small (1-33%) Fat Layer (Subcutaneous Tissue) Exposed: Yes Necrotic Quality: Adherent Slough Tendon Exposed: No Muscle Exposed: No Joint Exposed: No Bone Exposed: No Periwound Skin Texture Texture Color No Abnormalities Noted: No No Abnormalities Noted: No Callus: No Atrophie Blanche: No Mcjunkin, Lakira ([id number]) [id number] Page 7 of 11 Crepitus: No Cyanosis: No Excoriation: No Ecchymosis: No Induration: No Erythema: Yes  Rash: No Erythema Location: Circumferential Scarring: No Hemosiderin Staining: No Mottled: No Moisture Pallor: No No Abnormalities Noted: No Rubor: No Dry / Scaly: No Maceration: Yes Temperature / Pain Temperature: No Abnormality Tenderness on Palpation: Yes Treatment Notes Wound #1 (Calcaneus) Wound Laterality:  Right Cleanser Soap and Water Discharge Instruction: May shower and wash wound with dial antibacterial soap and water prior to dressing change. Wound Cleanser Discharge Instruction: Cleanse the wound with wound cleanser prior to applying a clean dressing using gauze sponges, not tissue or cotton balls. Peri-Wound Care Triamcinolone 15 (g) Discharge Instruction: Use triamcinolone 15 (g) as directed Zinc Oxide Ointment 30g tube Discharge Instruction: Apply Zinc Oxide to periwound with each dressing change Sween Lotion (Moisturizing lotion) Discharge Instruction: Apply moisturizing lotion as directed Topical Primary Dressing KerraCel Ag Gelling Fiber Dressing, 4x5 in (silver alginate) Discharge Instruction: Apply silver alginate to wound bed as instructed Secondary Dressing ABD Pad, 5x9 Discharge Instruction: Apply over primary dressing as directed. Drawtex 4x4 in Discharge Instruction: Apply over primary dressing as directed. Woven Gauze Sponge, Non-Sterile 4x4 in Discharge Instruction: Apply over primary dressing as directed. Secured With Elastic Bandage 4 inch (ACE bandage) Discharge Instruction: Secure with ACE bandage as directed. Kerlix Roll Sterile, 4.5x3.1 (in/yd) Discharge Instruction: Secure with Kerlix as directed. 60M Medipore H Soft Cloth Surgical T ape, 4 x 10 (in/yd) Discharge Instruction: Secure with tape as directed. Compression Wrap Compression Stockings Add-Ons Electronic Signature(s) Signed: 08/09/2022 5:25:29 PM By: Shawn Stall RN, BSN Entered By: Shawn Stall on 08/07/2022 14:43:42 Wound Assessment Details -------------------------------------------------------------------------------- Darral Dash ([id number]) [id number] Page 8 of 11 Patient Name: Date of Service: EVADENE, WARDRIP 08/07/2022 2:00 PM Medical Record Number: [id number] Patient Account Number: [id number] Date of Birth/Sex: Treating RN: 12/29/1965 (56 y.o. [name],  Millard.Loa Primary Care Tityana Pagan: [name] Other Clinician: Referring Sherril Heyward: Treating Sadhana Frater/Extender: [name] in Treatment: 8 Wound Status Wound Number: 2 Primary Etiology: Diabetic Wound/Ulcer of the Lower Extremity Wound Location: Right T Second oe Wound Status: Open Wounding Event: Gradually Appeared Comorbid History: Hypertension, Type II Diabetes, Osteomyelitis Date Acquired: 07/17/2022 Weeks Of Treatment: 3 Clustered Wound: No Photos Wound Measurements Length: (cm) 0.4 % Reduction in Area: 71.5% Width: (cm) 0.5 % Reduction in Volume: 85.5% Depth: (cm) 0.1 Epithelialization: Small (1-33%) Area: (cm) 0.157 Tunneling: No Volume: (cm) 0.016 Undermining: No Wound Description Classification: Grade 2 Foul Odor After Cleansing: No Wound Margin: Distinct, outline attached Slough/Fibrino Yes Exudate Amount: Medium Exudate Type: Serosanguineous Exudate Color: red, brown Wound Bed Granulation Amount: Medium (34-66%) Exposed Structure Necrotic Amount: Medium (34-66%) Fascia Exposed: No Necrotic Quality: Adherent Slough Fat Layer (Subcutaneous Tissue) Exposed: Yes Tendon Exposed: No Muscle Exposed: No Joint Exposed: No Bone Exposed: No Periwound Skin Texture Texture Color No Abnormalities Noted: No No Abnormalities Noted: No Callus: No Atrophie Blanche: No Crepitus: No Cyanosis: No Excoriation: No Ecchymosis: No Induration: No Erythema: No Rash: No Hemosiderin Staining: No Scarring: No Mottled: No Pallor: No Moisture Rubor: No No Abnormalities Noted: No Dry / Scaly: Yes Temperature / Pain Maceration: No Temperature: No Abnormality Treatment Notes Wound #2 (Toe Second) Wound Laterality: Right Cleanser Soap and Water Discharge Instruction: May shower and wash wound with dial antibacterial soap and water prior to dressing change. Torok, [name] ([id number]) [id number] Page 9 of 11 Wound  Cleanser Discharge Instruction: Cleanse the wound with wound cleanser prior to applying a clean dressing using gauze sponges, not tissue or cotton balls. Peri-Wound Care Triamcinolone 15 (g) Discharge Instruction: Use triamcinolone 15 (g) as  directed Zinc Oxide Ointment 30g tube Discharge Instruction: Apply Zinc Oxide to periwound with each dressing change Sween Lotion (Moisturizing lotion) Discharge Instruction: Apply moisturizing lotion as directed Topical Primary Dressing KerraCel Ag Gelling Fiber Dressing, 4x5 in (silver alginate) Discharge Instruction: Apply silver alginate to wound bed as instructed Secondary Dressing Woven Gauze Sponge, Non-Sterile 4x4 in Discharge Instruction: Apply over primary dressing as directed. Secured With 38M Medipore H Soft Cloth Surgical T ape, 4 x 10 (in/yd) Discharge Instruction: Secure with tape as directed. Compression Wrap Compression Stockings Add-Ons Electronic Signature(s) Signed: 08/09/2022 5:25:29 PM By: Shawn Stalleaton, Bobbi RN, BSN Entered By: Shawn Stalleaton, Bobbi on 08/07/2022 14:44:06 -------------------------------------------------------------------------------- Wound Assessment Details Patient Name: Date of Service: Darral DashDLEW, Harue 08/07/2022 2:00 PM Medical Record Number: [id number] Patient Account Number: [id number] Date of Birth/Sex: Treating RN: 02/09/1966 (56 y.o. Arta SilenceF) Deaton, Bobbi Primary Care Rollin Kotowski: Lia HoppingHasanaj, Xaje Other Clinician: Referring Dajai Wahlert: Treating Jessicah Croll/Extender: Sanjuana LettersStone III, Hoyt Hasanaj, Xaje Weeks in Treatment: 8 Wound Status Wound Number: 3 Primary Etiology: Pressure Ulcer Wound Location: Left Gluteus Wound Status: Open Wounding Event: Gradually Appeared Comorbid History: Hypertension, Type II Diabetes, Osteomyelitis Date Acquired: 07/31/2022 Weeks Of Treatment: 0 Clustered Wound: No Photos Prieur, Makhiya ([id number]) [id number] Page 10 of 11 Wound Measurements Length: (cm) 0.9 Width: (cm)  1 Depth: (cm) 0.1 Area: (cm) 0.707 Volume: (cm) 0.071 % Reduction in Area: % Reduction in Volume: Epithelialization: Small (1-33%) Tunneling: No Undermining: No Wound Description Classification: Unstageable/Unclassified Wound Margin: Distinct, outline attached Exudate Amount: Medium Exudate Type: Serosanguineous Exudate Color: red, brown Foul Odor After Cleansing: No Slough/Fibrino Yes Wound Bed Necrotic Amount: Large (67-100%) Exposed Structure Necrotic Quality: Adherent Slough Fascia Exposed: No Fat Layer (Subcutaneous Tissue) Exposed: Yes Tendon Exposed: No Muscle Exposed: No Joint Exposed: No Bone Exposed: No Periwound Skin Texture Texture Color No Abnormalities Noted: No No Abnormalities Noted: No Callus: No Atrophie Blanche: No Crepitus: No Cyanosis: No Excoriation: No Ecchymosis: No Induration: No Erythema: No Rash: No Hemosiderin Staining: No Scarring: No Mottled: No Pallor: No Moisture Rubor: No No Abnormalities Noted: No Dry / Scaly: No Maceration: No Treatment Notes Wound #3 (Gluteus) Wound Laterality: Left Cleanser Peri-Wound Care Skin Prep Discharge Instruction: Use skin prep as directed Topical Primary Dressing KerraCel Ag Gelling Fiber Dressing, 4x5 in (silver alginate) Discharge Instruction: Apply silver alginate to wound bed as instructed Secondary Dressing Zetuvit Plus Silicone Border Dressing 4x4 (in/in) Discharge Instruction: Apply silicone border over primary dressing as directed. Secured With Compression Wrap Compression Stockings Facilities managerAdd-Ons Electronic Signature(s) Signed: 08/09/2022 5:25:29 PM By: Shawn Stalleaton, Bobbi RN, BSN Entered By: Shawn Stalleaton, Bobbi on 08/07/2022 14:28:42 Theurer, Darien ([id number]) [id number] Page 11 of 11 -------------------------------------------------------------------------------- Vitals Details Patient Name: Date of Service: Darral DashDLEW, Airlie 08/07/2022 2:00 PM Medical Record Number:  [id number] Patient Account Number: [id number] Date of Birth/Sex: Treating RN: 03/16/1966 (56 y.o. Debara PickettF) Deaton, Millard.LoaBobbi Primary Care Boni Maclellan: Lia HoppingHasanaj, Xaje Other Clinician: Referring Kaaliyah Kita: Treating Burlin Mcnair/Extender: Laurann MontanaStone III, Hoyt Hasanaj, Xaje Weeks in Treatment: 8 Vital Signs Time Taken: 14:30 Temperature (F): 99 Height (in): 65 Pulse (bpm): 114 Weight (lbs): 331 Respiratory Rate (breaths/min): 20 Body Mass Index (BMI): 55.1 Blood Pressure (mmHg): 124/80 Reference Range: 80 - 120 mg / dl Electronic Signature(s) Signed: 08/09/2022 5:25:29 PM By: Shawn Stalleaton, Bobbi RN, BSN Entered By: Shawn Stalleaton, Bobbi on 08/07/2022 14:41:39

## 2022-08-21 ENCOUNTER — Encounter (HOSPITAL_BASED_OUTPATIENT_CLINIC_OR_DEPARTMENT_OTHER): Payer: No Typology Code available for payment source | Admitting: Physician Assistant

## 2022-08-21 DIAGNOSIS — E11621 Type 2 diabetes mellitus with foot ulcer: Secondary | ICD-10-CM | POA: Diagnosis not present

## 2022-08-21 DIAGNOSIS — L98412 Non-pressure chronic ulcer of buttock with fat layer exposed: Secondary | ICD-10-CM | POA: Diagnosis not present

## 2022-08-21 DIAGNOSIS — L97412 Non-pressure chronic ulcer of right heel and midfoot with fat layer exposed: Secondary | ICD-10-CM | POA: Diagnosis not present

## 2022-08-21 DIAGNOSIS — L97511 Non-pressure chronic ulcer of other part of right foot limited to breakdown of skin: Secondary | ICD-10-CM | POA: Diagnosis not present

## 2022-08-21 NOTE — Progress Notes (Addendum)
Vine, [name] ([id number]) [id number] Page 1 of 11 Visit Report for 08/21/2022 Chief Complaint Document Details Patient Name: Date of Service: [name], [name] 08/21/2022 10:00 A M Medical Record Number: [id number] Patient Account Number: [id number] Date of Birth/Sex: Treating RN: 1965/12/23 (56 y.o. F) Primary Care Provider: [name] Other Clinician: Referring Provider: Treating Provider/Extender: [name] in Treatment: 10 Information Obtained from: Patient Chief Complaint Right heel ulcer Electronic Signature(s) Signed: 08/21/2022 10:12:06 AM By: Lenda Kelp PA-C Entered By: Lenda Kelp on 08/21/2022 10:12:06 -------------------------------------------------------------------------------- Debridement Details Patient Name: Date of Service: KEITH, CANCIO 08/21/2022 10:00 A M Medical Record Number: [id number] Patient Account Number: [id number] Date of Birth/Sex: Treating RN: Aug 23, 1966 (56 y.o. [name], [name] Primary Care Provider: [name] Other Clinician: Referring Provider: Treating Provider/Extender: [name] in Treatment: 10 Debridement Performed for Assessment: Wound #2 Right T Second oe Performed By: Physician Lenda Kelp, PA Debridement Type: Debridement Severity of Tissue Pre Debridement: Limited to breakdown of skin Level of Consciousness (Pre-procedure): Awake and Alert Pre-procedure Verification/Time Out Yes - 11:05 Taken: Start Time: 11:05 Pain Control: Lidocaine T Area Debrided (L x W): otal 0.5 (cm) x 0.5 (cm) = 0.25 (cm) Tissue and other material debrided: Viable, Non-Viable, Eschar Level: Non-Viable Tissue Debridement Description: Selective/Open Wound Instrument: Curette Bleeding: Minimum Hemostasis Achieved: Silver Nitrate End Time: 11:05 Procedural Pain: 0 Post Procedural Pain: 0 Response to Treatment: Procedure was tolerated well Level of  Consciousness (Post- Awake and Alert procedure): Post Debridement Measurements of Total Wound Length: (cm) 0.3 Width: (cm) 0.3 Depth: (cm) 0.1 Volume: (cm) 0.007 Character of Wound/Ulcer Post Debridement: Improved Severity of Tissue Post Debridement: Limited to breakdown of skin Dunford, Benigna ([id number]) [id number] Page 2 of 11 Post Procedure Diagnosis Same as Pre-procedure Electronic Signature(s) Signed: 08/21/2022 3:58:30 PM By: Lenda Kelp PA-C Signed: 08/23/2022 12:09:11 PM By: Fonnie Mu RN Entered By: Fonnie Mu on 08/21/2022 11:06:26 -------------------------------------------------------------------------------- HPI Details Patient Name: Date of Service: [name], [name] 08/21/2022 10:00 A M Medical Record Number: [id number] Patient Account Number: [id number] Date of Birth/Sex: Treating RN: 1966-04-25 (56 y.o. F) Primary Care Provider: [name] Other Clinician: Referring Provider: Treating Provider/Extender: [name] in Treatment: 10 History of Present Illness HPI Description: 06-13-2022 upon evaluation today patient presents for evaluation of her right heel ulcer. She is having a tremendous amount of pain at this location. She tells me that her most recent hemoglobin A1c was 8.7 and that was on 08-23-2021. This ulcer on the heel has been present she states since February 2023 when she had an injection to the heel by podiatry. She states that she began to have increasing pain the wound opened and has been open ever since. 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 She had an x-ray in June if anything can although there did not appear to be any obvious evidence of osteomyelitis at that point. She subsequently has had OR debridement several times of the wound performed by podiatry. Patient does have a history of lymphedema of the lower extremities she is also a type II  diabetic. 06-19-2022 upon evaluation today patient appears to be doing better in regard to the size of her leg which is significantly improved. With that being said she had a lot of drainage from the heel which is completely understandable considering what is going on at this time. There does not appear to be any evidence of active infection there is no  warmth or irritation to the leg in general. With that being said the patient does have an issue here with the amount of drainage that is going on I definitely think Zetuvit is good to be the better way to go in place of ABD pads. Also think that she could potentially benefit from 3 times per week dressing changes but again right now we are going to stick with the to change it today, change in her Friday, and then subsequently seeing where things stand next Wednesday. The other option would be to change her to a different day for wound care having her come then say like a Tuesday Friday or Monday Thursday. 06-26-2022 upon evaluation patient's wound bed actually showed signs of doing well in regard to the overall size it was measuring slightly smaller. With that being said unfortunately the biggest issue we see is she still has a tremendous amount of drainage which is what could keep this from being able to use a total contact cast. For that reason I am did going to discuss with her today the possibility of trying Tubigrip to see if that could be of benefit for her. She voiced understanding and is in agreement with giving this a try. 07-03-2022 upon evaluation today patient unfortunately appears to be doing significantly worse at this point in regard to her swelling due to the fact that she was unable to really wear the Tubigrip. She tells me that it was cutting into her. With that being said she also did have an MRI this MRI revealed that she does have osteomyelitis in the heel this was actually just performed Monday, 25 September. This does show signs of  "early osteomyelitis". Nonetheless this is still concerning to me. The wound also appears to be getting deeper in the [name] aspect of this which has me a little concerned as well. I do believe that she is likely going require an aggressive approach here to try to get things better. I discussed that in greater detail with her today. I will detailed in the plan. 07-17-2022 upon evaluation today patient appears to be doing poorly in regard to her wound. Unfortunately she does not show any signs of infection systemically though locally this seems to be doing worse with the wound actually being bigger than where it was previous. She did have an MRI that showed evidence of osteomyelitis actually recommended a referral to ID she was evaluated infectious disease and they recommended referral to podiatry and to be honest have recommended amputation based on what the patient tells me today. With that being said this is something that she is not interested in at all to be perfectly honest. For that reason she wants to know what she should do and where she should go at this point that is where the majority of the conversation which was quite lengthy today went. Obviously understand her concern here but I think she is going to have to definitely get off this and likely this means she is going to need to come out of work. 10/18; Since the patient was the patient is using Hydrofera Blue on her right heel wound. She has a new larger open area superior to the wound. The skin on the bottom of her foot is completely macerated. I reviewed the infectious disease note on this patient from 10/9. They recommended keeping the patient on Delofloxacin 450 twice daily until follow-up tomorrow. The patient states she is not going back there as the only thing they wanted  to do was "cut my leg off]. She has not seen podiatry. Her lab works shows a CRP markedly elevated at 240.2 a sedimentation rate of 96 the rest of her CBC is  normal creatinine of 0.38 The patient has been to see her primary doctor who is Dr. Leonides Schanz [SPo] In [place]. According the patient Dr. Loralee Pacas has ordered vancomycin and Zosyn to start on November 1. She is not taking the delofloxacin that was suggested by infectious disease. Very angry that they just wanted to consider her for an amputation although I do not specifically see that stated in their note 07-31-2022 upon evaluation today patient still has a significant wound over her plantar aspect of her right l foot region. With that being said I am definitely concerned about the fact that the patient probably does need to be in hyperbaric oxygen therapy at this point. I think we should try to get this going as quickly as possible. She actually is going to be set up with a PICC line next Wednesday and then following this we will have the vancomycin and Zosyn that she will be taking for 8 weeks according to what she tells me. I think this is definitely going to be beneficial and again the goal here is limb salvage. In combination with that I think the hyperbaric oxygen therapy would be ultimately significantly helpful for her. 08-07-2022 upon evaluation today patient appears to still be doing quite poorly overall she still has not gotten her antibiotics. She got her PICC line today which I Gonet, [name] ([id number]) [id number] Page 3 of 11 assumed she will be getting the first dose of antibiotics as well during that time. With that being said unfortunately she did not get the antibiotics and in fact as I questioned her further she does not even know when or if that is going to be started this week. Again I am very concerned about this considering she already has a PICC line in place we do not want this to clot off on top of the fact that she should already be on these antibiotics she just never went to the ER for evaluation therefore they never got this started. At this point based  on what I am seeing I really think that the ideal thing would be for [place] to see about getting her to the ER for further evaluation and treatment to see if they can get this started for her as soon as possible. The patient voiced understanding and is in agreement with the plan. I gave her recommendations for what should be done also called [facility] infusion therapy in order to see if they would be the ones that seem to be available for her IV infusions but they tell me that they no longer do this that is apparently where the orders were sent to for her infusion therapy. That was by her primary care provider. 08-21-2022 upon evaluation today patient's wound on the bottom of the heel and foot area as well as the toe appear to be doing significantly better. Fortunately I do not see any evidence of infection locally or systemically and everything is measuring much smaller than where we have been. The wound in the left gluteal region also is dramatically improved compared to last time she was here. She has started the antibiotics this is cefepime and vancomycin. She did have a somewhat high trough and therefore they have her hold that today and they are to recheck in the morning I believe she told  me. Nonetheless with the antibiotics going she seems to be doing significantly better which is great news. Electronic Signature(s) Signed: 08/21/2022 1:00:38 PM By: Lenda Kelp PA-C Previous Signature: 08/21/2022 1:00:26 PM Version By: Lenda Kelp PA-C Entered By: Lenda Kelp on 08/21/2022 13:00:38 -------------------------------------------------------------------------------- Physical Exam Details Patient Name: Date of Service: TARAANN, [name] 08/21/2022 10:00 A M Medical Record Number: [id number] Patient Account Number: [id number] Date of Birth/Sex: Treating RN: 03-14-1966 (56 y.o. F) Primary Care Provider: [name] Other Clinician: Referring Provider: Treating Provider/Extender: [name] in Treatment: 10 Constitutional Obese and well-hydrated in no acute distress. Respiratory normal breathing without difficulty. Psychiatric this patient is able to make decisions and demonstrates good insight into disease process. Alert and Oriented x 3. pleasant and cooperative. Notes Upon inspection patient's wound bed actually showed signs of significant improvement compared to where we were last time I saw her. I am actually very pleased and I been very concerned to be honest. I am glad she is on the antibiotics were seeing some new skin islands already some definite signs of this turning around. We are also working on the hyperbaric oxygen therapy which were still waiting on the approval for from insurance. Electronic Signature(s) Signed: 08/21/2022 1:01:07 PM By: Lenda Kelp PA-C Entered By: Lenda Kelp on 08/21/2022 13:01:07 -------------------------------------------------------------------------------- Physician Orders Details Patient Name: Date of Service: JANETTA, VANDOREN 08/21/2022 10:00 A M Medical Record Number: [id number] Patient Account Number: [id number] Date of Birth/Sex: Treating RN: [birth year] (56 y.o. 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 - Wednesday w/ [facility] [place] Return A Other: - Ensure you continue oral antibiotics until PICC line place, then start and finish all IV antibiotics- Vancomycin and Zosyn (ordered primary  care provider). Consider Hyberbaric Oxygen Therapy. Home Health to hopefully be reaching out to you for antibiotic therapy. We will call them today as well to see Anesthetic (In clinic) Topical Lidocaine 5% applied to wound bed Bathing/ Shower/ Hygiene May shower with protection but do not get wound dressing(s) wet. Edema Control - Lymphedema / SCD / Other Elevate legs to the level of the heart or above for 30 minutes daily and/or when sitting, a frequency of: - 3-4 times a day throughout the day. Avoid standing for long periods of time. Off-Loading Open toe surgical shoe to: - w/ peg assist Other: - Minimize walking and standing as much as possible to right foot to aid in offloading to wound. While at work use the wheelchair for mobility. Hyperbaric Oxygen Therapy Wound #1 Right Calcaneus Evaluate for HBO Therapy Indication: - Wagner Grade 3 to right calcaneus If appropriate for treatment, begin HBOT per protocol: 2.5 ATA for 90 Minutes with 2 Five (5) Minute A Breaks ir Total Number of Treatments: - 40 One treatments per day (delivered Monday through Friday unless otherwise specified in Special Instructions below): Finger stick Blood Glucose Pre- and Post- HBOT Treatment. Follow Hyperbaric Oxygen Glycemia Protocol A frin (Oxymetazoline HCL) 0.05% nasal spray - 1 spray in both nostrils daily as needed prior to HBO treatment for difficulty clearing ears Wound Treatment Wound #  1 - Calcaneus Wound Laterality: Right Cleanser: Soap and Water 1 x Per Day/15 Days Discharge Instructions: May shower and wash wound with dial antibacterial soap and water prior to dressing change. Cleanser: Wound Cleanser (DME) (Generic) 1 x Per Day/15 Days Discharge Instructions: Cleanse the wound with wound cleanser prior to applying a clean dressing using gauze sponges, not tissue or cotton balls. Peri-Wound Care: Triamcinolone 15 (g) 1 x Per Day/15 Days Discharge Instructions: Use triamcinolone 15 (g) as  directed Peri-Wound Care: Zinc Oxide Ointment 30g tube 1 x Per Day/15 Days Discharge Instructions: Apply Zinc Oxide to periwound with each dressing change Peri-Wound Care: Sween Lotion (Moisturizing lotion) 1 x Per Day/15 Days Discharge Instructions: Apply moisturizing lotion as directed Prim Dressing: KerraCel Ag Gelling Fiber Dressing, 4x5 in (silver alginate) (DME) (Generic) 1 x Per Day/15 Days ary Discharge Instructions: Apply silver alginate to wound bed as instructed Secondary Dressing: ABD Pad, 5x9 (DME) (Generic) 1 x Per Day/15 Days Discharge Instructions: Apply over primary dressing as directed. Secondary Dressing: Drawtex 4x4 in (DME) (Generic) 1 x Per Day/15 Days Discharge Instructions: Apply over primary dressing as directed. Secondary Dressing: Woven Gauze Sponge, Non-Sterile 4x4 in (DME) (Generic) 1 x Per Day/15 Days Discharge Instructions: Apply over primary dressing as directed. Secured With: Elastic Bandage 4 inch (ACE bandage) (DME) (Generic) 1 x Per Day/15 Days Discharge Instructions: Secure with ACE bandage as directed. Secured With: [employer], 4.5x3.1 (in/yd) (DME) (Generic) 1 x Per Day/15 Days Discharge Instructions: Secure with Kerlix as directed. Secured With: 216M Medipore H Soft Cloth Surgical T ape, 4 x 10 (in/yd) (DME) (Generic) 1 x Per Day/15 Days Discharge Instructions: Secure with tape as directed. Wound #2 - T Second oe Wound Laterality: Right Buesing, Akshaya ([id number]) [id number] Page 5 of 11 Cleanser: Soap and Water 1 x Per Day/15 Days Discharge Instructions: May shower and wash wound with dial antibacterial soap and water prior to dressing change. Cleanser: Wound Cleanser (DME) (Generic) 1 x Per Day/15 Days Discharge Instructions: Cleanse the wound with wound cleanser prior to applying a clean dressing using gauze sponges, not tissue or cotton balls. Peri-Wound Care: Triamcinolone 15 (g) 1 x Per Day/15 Days Discharge  Instructions: Use triamcinolone 15 (g) as directed Peri-Wound Care: Zinc Oxide Ointment 30g tube 1 x Per Day/15 Days Discharge Instructions: Apply Zinc Oxide to periwound with each dressing change Peri-Wound Care: Sween Lotion (Moisturizing lotion) 1 x Per Day/15 Days Discharge Instructions: Apply moisturizing lotion as directed Prim Dressing: KerraCel Ag Gelling Fiber Dressing, 4x5 in (silver alginate) (DME) (Generic) 1 x Per Day/15 Days ary Discharge Instructions: Apply silver alginate to wound bed as instructed Secondary Dressing: Woven Gauze Sponge, Non-Sterile 4x4 in (DME) (Generic) 1 x Per Day/15 Days Discharge Instructions: Apply over primary dressing as directed. Secured With: 216M Medipore H Soft Cloth Surgical T ape, 4 x 10 (in/yd) (DME) (Generic) 1 x Per Day/15 Days Discharge Instructions: Secure with tape as directed. Wound #3 - Gluteus Wound Laterality: Left Peri-Wound Care: Skin Prep (DME) (Generic) 1 x Per Day/15 Days Discharge Instructions: Use skin prep as directed Prim Dressing: KerraCel Ag Gelling Fiber Dressing, 4x5 in (silver alginate) (DME) (Generic) 1 x Per Day/15 Days ary Discharge Instructions: Apply silver alginate to wound bed as instructed Secondary Dressing: Zetuvit Plus Silicone Border Dressing 4x4 (in/in) (DME) (Generic) 1 x Per Day/15 Days Discharge Instructions: Apply silicone border over primary dressing as directed. Radiology X-ray, Chest - (ICD10 E11.621 - Type 2 diabetes mellitus with foot ulcer) GLYCEMIA INTERVENTIONS PROTOCOL  PRE-HBO GLYCEMIA INTERVENTIONS ACTION INTERVENTION Obtain pre-HBO capillary blood glucose (ensure 1 physician order is in chart). A. Notify HBO physician and await physician orders. 2 If result is 70 mg/dl or below: B. If the result meets the [name] definition of a critical result, follow [name] policy. A. Give patient an 8 ounce Glucerna Shake, an 8 ounce Ensure, or 8 ounces of a Glucerna/Ensure equivalent  dietary supplement*. B. Wait 30 minutes. If result is 71 mg/dl to 409130 mg/dl: C. Retest patients capillary blood glucose (CBG). D. If result greater than or equal to 110 mg/dl, proceed with HBO. If result less than 110 mg/dl, notify HBO physician and consider holding HBO. If result is 131 mg/dl to 811249 mg/dl: A. Proceed with HBO. A. Notify HBO physician and await physician orders. B. It is recommended to hold HBO and do If result is 250 mg/dl or greater: blood/urine ketone testing. C. If the result meets the [name] definition of a critical result, follow [name] policy. POST-HBO GLYCEMIA INTERVENTIONS ACTION INTERVENTION Obtain post HBO capillary blood glucose (ensure 1 physician order is in chart). A. Notify HBO physician and await physician orders. 2 If result is 70 mg/dl or below: B. If the result meets the [name] definition of a critical result, follow [name] policy. A. Give patient an 8 ounce Glucerna Shake, an 8 ounce Ensure, or 8 ounces of a Glucerna/Ensure equivalent dietary supplement*. B. Wait 15 minutes for symptoms of If result is 71 mg/dl to 914100 mg/dl: hypoglycemia (i.e. nervousness, anxiety, sweating, chills, clamminess, irritability, confusion, tachycardia or dizziness). Dorsi, [name] AspINDY ([id number]) [id number] Page 6 of 11 C. If patient asymptomatic, discharge patient. If patient symptomatic, repeat capillary blood glucose (CBG) and notify HBO physician. If result is 101 mg/dl to 086249 mg/dl: A. Discharge patient. A. Notify HBO physician and await physician orders. B. It is recommended to do blood/urine ketone If result is 250 mg/dl or greater: testing. C. If the result meets the [name] definition of a critical result, follow [name] policy. *Juice or candies are NOT equivalent products. If patient refuses the Glucerna or Ensure, please consult the [name] dietitian for an appropriate substitute. Electronic  Signature(s) Signed: 08/21/2022 3:58:30 PM By: Lenda KelpStone III, Sharlee Rufino PA-C Signed: 08/23/2022 12:09:11 PM By: Fonnie MuBreedlove, [name] RN Entered By: Fonnie MuBreedlove, [name] on 08/21/2022 11:08:27 Prescription 08/21/2022 -------------------------------------------------------------------------------- Erick BlinksEDLEW, Linnell Stone III, Ruben Pyka PA Patient Name: Provider: 07/02/1966 [phone] Date of Birth: NPI#[name] [name]: [id number] Sex: DEA #: [phone] Phone #: License #[name] Patient Address: [address] [place], [place] [address] [address] [place], [place] [address] [phone] Allergies No Known Allergies Provider's Orders X-ray, Chest - ICD10: E11.621 Hand Signature: Date(s): Electronic Signature(s) Signed: 08/21/2022 3:58:30 PM By: Lenda KelpStone III, Raylon Lamson PA-C Signed: 08/23/2022 12:09:11 PM By: Fonnie MuBreedlove, [name] RN Entered By: Fonnie MuBreedlove, [name] on 08/21/2022 11:08:28 -------------------------------------------------------------------------------- Problem List Details Patient Name: Date of Service: [name] DashDLEW, Tabbitha 08/21/2022 10:00 A M Medical Record Number: [id number] Patient Account Number: [id number] Date of Birth/Sex: Treating RN: 11/06/1965 (56 y.o. 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 F) Primary Care Provider: [name] Other Clinician: Referring Provider: Treating Provider/Extender: [name] in Treatment: 10 Subjective Chief Complaint Information obtained from Patient Right heel ulcer History of Present Illness (HPI) 06-13-2022 upon evaluation today patient presents for evaluation of her right heel ulcer. She is having a tremendous amount of pain at this location. She tells me that her most recent hemoglobin A1c was 8.7 and that was on 08-23-2021. This ulcer on the heel has been present she states since February 2023 when she had an injection to the heel by podiatry. She states that she began to have increasing pain the wound opened and has been open ever since. 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 She had an x-ray in June if anything can although there did not appear to be any obvious evidence of osteomyelitis at that point. She subsequently has had OR debridement several times of the wound performed by podiatry. Patient does have a history of lymphedema of the lower extremities she is also a type II diabetic. 06-19-2022 upon evaluation today patient appears to be doing better in regard to the size of her leg which is significantly improved. With that being said she had a lot of drainage from the heel which is completely understandable considering what is going on at this time. There does not appear to be any evidence of active infection there is no warmth or irritation to the leg in general. With that being said the patient does have an issue here with the amount of drainage that is going on I definitely  think Zetuvit is good to be the better way to go in place of ABD pads. Also think that she could potentially benefit from 3 times per week dressing changes but again right now we are going to stick with the to change it today, change in her Friday, and then subsequently seeing where things stand next Wednesday. The other option would be to change her to a different day for wound care having her come then say like a Tuesday Friday or Monday Thursday. 06-26-2022 upon evaluation patient's wound bed actually showed signs of doing well in regard to the overall size it was measuring slightly smaller. With that being said unfortunately the biggest issue we see is she still has a tremendous amount of drainage which is what could keep this from being able to use a total contact cast. For that reason I am did going to discuss with her today the possibility of trying Tubigrip to see if that could be of benefit for her. She voiced understanding and is in agreement with giving this a try. 07-03-2022 upon evaluation today patient unfortunately appears to be doing significantly worse at this point in regard to her swelling due to the fact that she was unable to really wear the Tubigrip. She tells me that it was cutting into her. With that being said she also did have an MRI this MRI revealed that she does have osteomyelitis in the heel this was actually just performed Monday, 25 September. This does show signs of "early osteomyelitis". Nonetheless this is still concerning to me. The wound also appears to be getting deeper  in the [name] aspect of this which has me a little concerned as well. I do believe that she is likely going require an aggressive approach here to try to get things better. I discussed that in greater detail with her today. I will detailed in the plan. 07-17-2022 upon evaluation today patient appears to be doing poorly in regard to her wound. Unfortunately she does not show any signs of  infection systemically though locally this seems to be doing worse with the wound actually being bigger than where it was previous. She did have an MRI that showed evidence of osteomyelitis actually recommended a referral to ID she was evaluated infectious disease and they recommended referral to podiatry and to be honest have recommended amputation based on what the patient tells me today. With that being said this is something that she is not interested in at all to be perfectly honest. For that reason she wants to know what she should do and where she should go at this point that is where the majority of the conversation which was quite lengthy today went. Obviously understand her concern here but I think she is going to have to definitely get off this and likely this means she is going to need to come out of work. [name], [name] ([id number]) [id number] Page 8 of 11 10/18; Since the patient was the patient is using Hydrofera Blue on her right heel wound. She has a new larger open area superior to the wound. The skin on the bottom of her foot is completely macerated. I reviewed the infectious disease note on this patient from 10/9. They recommended keeping the patient on Delofloxacin 450 twice daily until follow-up tomorrow. The patient states she is not going back there as the only thing they wanted to do was "cut my leg off]. She has not seen podiatry. Her lab works shows a CRP markedly elevated at 240.2 a sedimentation rate of 96 the rest of her CBC is normal creatinine of 0.38 The patient has been to see her primary doctor who is Dr. Leonides Schanz [[facility]] In [place]. According the patient Dr. Loralee Pacas has ordered vancomycin and Zosyn to start on November 1. She is not taking the delofloxacin that was suggested by infectious disease. Very angry that they just wanted to consider her for an amputation although I do not specifically see that stated in their note 07-31-2022 upon  evaluation today patient still has a significant wound over her plantar aspect of her right l foot region. With that being said I am definitely concerned about the fact that the patient probably does need to be in hyperbaric oxygen therapy at this point. I think we should try to get this going as quickly as possible. She actually is going to be set up with a PICC line next Wednesday and then following this we will have the vancomycin and Zosyn that she will be taking for 8 weeks according to what she tells me. I think this is definitely going to be beneficial and again the goal here is limb salvage. In combination with that I think the hyperbaric oxygen therapy would be ultimately significantly helpful for her. 08-07-2022 upon evaluation today patient appears to still be doing quite poorly overall she still has not gotten her antibiotics. She got her PICC line today which I assumed she will be getting the first dose of antibiotics as well during that time. With that being said unfortunately she did not get the antibiotics and in fact as I questioned  her further she does not even know when or if that is going to be started this week. Again I am very concerned about this considering she already has a PICC line in place we do not want this to clot off on top of the fact that she should already be on these antibiotics she just never went to the ER for evaluation therefore they never got this started. At this point based on what I am seeing I really think that the ideal thing would be for [place] to see about getting her to the ER for further evaluation and treatment to see if they can get this started for her as soon as possible. The patient voiced understanding and is in agreement with the plan. I gave her recommendations for what should be done also called [facility] infusion therapy in order to see if they would be the ones that seem to be available for her IV infusions but they tell me that they no longer do this  that is apparently where the orders were sent to for her infusion therapy. That was by her primary care provider. 08-21-2022 upon evaluation today patient's wound on the bottom of the heel and foot area as well as the toe appear to be doing significantly better. Fortunately I do not see any evidence of infection locally or systemically and everything is measuring much smaller than where we have been. The wound in the left gluteal region also is dramatically improved compared to last time she was here. She has started the antibiotics this is cefepime and vancomycin. She did have a somewhat high trough and therefore they have her hold that today and they are to recheck in the morning I believe she told me. Nonetheless with the antibiotics going she seems to be doing significantly better which is great news. Objective Constitutional Obese and well-hydrated in no acute distress. Vitals Time Taken: 10:14 AM, Height: 65 in, Weight: 331 lbs, BMI: 55.1, Temperature: 98.7 F, Pulse: 91 bpm, Respiratory Rate: 18 breaths/min, Blood Pressure: 130/73 mmHg. Respiratory normal breathing without difficulty. Psychiatric this patient is able to make decisions and demonstrates good insight into disease process. Alert and Oriented x 3. pleasant and cooperative. General Notes: Upon inspection patient's wound bed actually showed signs of significant improvement compared to where we were last time I saw her. I am actually very pleased and I been very concerned to be honest. I am glad she is on the antibiotics were seeing some new skin islands already some definite signs of this turning around. We are also working on the hyperbaric oxygen therapy which were still waiting on the approval for from insurance. Integumentary (Hair, Skin) Wound #1 status is Open. Original cause of wound was Gradually Appeared. The date acquired was: 11/07/2020. The wound has been in treatment 10 weeks. The wound is located on the Right  Calcaneus. The wound measures 9.5cm length x 3.4cm width x 0.5cm depth; 25.368cm^2 area and 12.684cm^3 volume. There is Fat Layer (Subcutaneous Tissue) exposed. There is no tunneling or undermining noted. There is a large amount of serosanguineous drainage noted. Foul odor after cleansing was noted. The wound margin is distinct with the outline attached to the wound base. There is large (67-100%) red, pink granulation within the wound bed. There is no necrotic tissue within the wound bed. The periwound skin appearance exhibited: Maceration, Erythema. The periwound skin appearance did not exhibit: Callus, Crepitus, Excoriation, Induration, Rash, Scarring, Dry/Scaly, Atrophie Blanche, Cyanosis, Ecchymosis, Hemosiderin Staining, Mottled, Pallor, Rubor. The  surrounding wound skin color is noted with erythema which is circumferential. Periwound temperature was noted as No Abnormality. The periwound has tenderness on palpation. Wound #2 status is Open. Original cause of wound was Gradually Appeared. The date acquired was: 07/17/2022. The wound has been in treatment 5 weeks. The wound is located on the Right T Second. The wound measures 0.1cm length x 0.1cm width x 0.1cm depth; 0.008cm^2 area and 0.001cm^3 volume. There is oe Fat Layer (Subcutaneous Tissue) exposed. There is no tunneling or undermining noted. There is a medium amount of serosanguineous drainage noted. The wound margin is distinct with the outline attached to the wound base. There is medium (34-66%) granulation within the wound bed. There is a medium (34-66%) amount of necrotic tissue within the wound bed including Adherent Slough. The periwound skin appearance exhibited: Callus, Dry/Scaly. The periwound skin appearance did not exhibit: Crepitus, Excoriation, Induration, Rash, Scarring, Maceration, Atrophie Blanche, Cyanosis, Ecchymosis, Hemosiderin Staining, Mottled, Pallor, Rubor, Erythema. Periwound temperature was noted as No  Abnormality. Wound #3 status is Open. Original cause of wound was Gradually Appeared. The date acquired was: 07/31/2022. The wound has been in treatment 2 weeks. The wound is located on the Left Gluteus. The wound measures 0.5cm length x 1cm width x 0.1cm depth; 0.393cm^2 area and 0.039cm^3 volume. There is Fat Layer (Subcutaneous Tissue) exposed. There is no tunneling or undermining noted. There is a medium amount of serosanguineous drainage noted. The wound margin is distinct with the outline attached to the wound base. There is a large (67-100%) amount of necrotic tissue within the wound bed including Adherent Slough. The periwound skin appearance did not exhibit: Callus, Crepitus, Excoriation, Induration, Rash, Scarring, Dry/Scaly, Maceration, Atrophie Blanche, Cyanosis, Ecchymosis, Hemosiderin Staining, Mottled, Pallor, Rubor, Erythema. Repetto, [name] ([id number]) [id number] Page 9 of 11 Assessment Active Problems ICD-10 Type 2 diabetes mellitus with foot ulcer Non-pressure chronic ulcer of right heel and midfoot with fat layer exposed Lymphedema, not elsewhere classified Procedures Wound #2 Pre-procedure diagnosis of Wound #2 is a Diabetic Wound/Ulcer of the Lower Extremity located on the Right T Second .Severity of Tissue Pre Debridement oe is: Limited to breakdown of skin. There was a Selective/Open Wound Non-Viable Tissue Debridement with a total area of 0.25 sq cm performed by Lenda Kelp, PA. With the following instrument(s): Curette to remove Viable and Non-Viable tissue/material. Material removed includes Eschar after achieving pain control using Lidocaine. No specimens were taken. A time out was conducted at 11:05, prior to the start of the procedure. A Minimum amount of bleeding was controlled with Silver Nitrate. The procedure was tolerated well with a pain level of 0 throughout and a pain level of 0 following the procedure. Post  Debridement Measurements: 0.3cm length x 0.3cm width x 0.1cm depth; 0.007cm^3 volume. Character of Wound/Ulcer Post Debridement is improved. Severity of Tissue Post Debridement is: Limited to breakdown of skin. Post procedure Diagnosis Wound #2: Same as Pre-Procedure Plan Follow-up Appointments: Return Appointment in 1 week. - Wednesday w/ Allen D[place] Other: - Ensure you continue oral antibiotics until PICC line place, then start and finish all IV antibiotics- Vancomycin and Zosyn (ordered primary care provider). Consider Hyberbaric Oxygen Therapy. Home Health to hopefully be reaching out to you for antibiotic therapy. We will call them today as well to see Anesthetic: (In clinic) Topical Lidocaine 5% applied to wound bed Bathing/ Shower/ Hygiene: May shower with protection but do not get wound dressing(s) wet. Edema Control - Lymphedema / SCD / Other: Elevate legs to the  level of the heart or above for 30 minutes daily and/or when sitting, a frequency of: - 3-4 times a day throughout the day. Avoid standing for long periods of time. Off-Loading: Open toe surgical shoe to: - w/ peg assist Other: - Minimize walking and standing as much as possible to right foot to aid in offloading to wound. While at work use the wheelchair for mobility. Hyperbaric Oxygen Therapy: Wound #1 Right Calcaneus: Evaluate for HBO Therapy Indication: - Wagner Grade 3 to right calcaneus If appropriate for treatment, begin HBOT per protocol: 2.5 ATA for 90 Minutes with 2 Five (5) Minute Air Breaks T Number of Treatments: - 40 One treatments per day (delivered Monday through Friday unless otherwise specified in Special Instructions below): otal Finger stick Blood Glucose Pre- and Post- HBOT Treatment. Follow Hyperbaric Oxygen Glycemia Protocol Afrin (Oxymetazoline HCL) 0.05% nasal spray - 1 spray in both nostrils daily as needed prior to HBO treatment for difficulty clearing ears Radiology ordered were: X-ray,  Chest WOUND #1: - Calcaneus Wound Laterality: Right Cleanser: Soap and Water 1 x Per Day/15 Days Discharge Instructions: May shower and wash wound with dial antibacterial soap and water prior to dressing change. Cleanser: Wound Cleanser (DME) (Generic) 1 x Per Day/15 Days Discharge Instructions: Cleanse the wound with wound cleanser prior to applying a clean dressing using gauze sponges, not tissue or cotton balls. Peri-Wound Care: Triamcinolone 15 (g) 1 x Per Day/15 Days Discharge Instructions: Use triamcinolone 15 (g) as directed Peri-Wound Care: Zinc Oxide Ointment 30g tube 1 x Per Day/15 Days Discharge Instructions: Apply Zinc Oxide to periwound with each dressing change Peri-Wound Care: Sween Lotion (Moisturizing lotion) 1 x Per Day/15 Days Discharge Instructions: Apply moisturizing lotion as directed Prim Dressing: KerraCel Ag Gelling Fiber Dressing, 4x5 in (silver alginate) (DME) (Generic) 1 x Per Day/15 Days ary Discharge Instructions: Apply silver alginate to wound bed as instructed Secondary Dressing: ABD Pad, 5x9 (DME) (Generic) 1 x Per Day/15 Days Discharge Instructions: Apply over primary dressing as directed. Secondary Dressing: Drawtex 4x4 in (DME) (Generic) 1 x Per Day/15 Days Discharge Instructions: Apply over primary dressing as directed. Secondary Dressing: Woven Gauze Sponge, Non-Sterile 4x4 in (DME) (Generic) 1 x Per Day/15 Days Discharge Instructions: Apply over primary dressing as directed. Secured With: Elastic Bandage 4 inch (ACE bandage) (DME) (Generic) 1 x Per Day/15 Days Discharge Instructions: Secure with ACE bandage as directed. Secured With: [employer], 4.5x3.1 (in/yd) (DME) (Generic) 1 x Per Day/15 Days Discharge Instructions: Secure with Kerlix as directed. Secured With: 13M Medipore H Soft Cloth Surgical T ape, 4 x 10 (in/yd) (DME) (Generic) 1 x Per Day/15 Days Discharge Instructions: Secure with tape as directed. WOUND #2: - T Second Wound  Laterality: Right oe Cleanser: Soap and Water 1 x Per Day/15 Days [place], [place] ([id number]) [id number] Page 10 of 11 Discharge Instructions: May shower and wash wound with dial antibacterial soap and water prior to dressing change. Cleanser: Wound Cleanser (DME) (Generic) 1 x Per Day/15 Days Discharge Instructions: Cleanse the wound with wound cleanser prior to applying a clean dressing using gauze sponges, not tissue or cotton balls. Peri-Wound Care: Triamcinolone 15 (g) 1 x Per Day/15 Days Discharge Instructions: Use triamcinolone 15 (g) as directed Peri-Wound Care: Zinc Oxide Ointment 30g tube 1 x Per Day/15 Days Discharge Instructions: Apply Zinc Oxide to periwound with each dressing change Peri-Wound Care: Sween Lotion (Moisturizing lotion) 1 x Per Day/15 Days Discharge Instructions: Apply moisturizing lotion as directed Prim Dressing: KerraCel Ag Fayrene Helper  Fiber Dressing, 4x5 in (silver alginate) (DME) (Generic) 1 x Per Day/15 Days ary Discharge Instructions: Apply silver alginate to wound bed as instructed Secondary Dressing: Woven Gauze Sponge, Non-Sterile 4x4 in (DME) (Generic) 1 x Per Day/15 Days Discharge Instructions: Apply over primary dressing as directed. Secured With: 29M Medipore H Soft Cloth Surgical T ape, 4 x 10 (in/yd) (DME) (Generic) 1 x Per Day/15 Days Discharge Instructions: Secure with tape as directed. WOUND #3: - Gluteus Wound Laterality: Left Peri-Wound Care: Skin Prep (DME) (Generic) 1 x Per Day/15 Days Discharge Instructions: Use skin prep as directed Prim Dressing: KerraCel Ag Gelling Fiber Dressing, 4x5 in (silver alginate) (DME) (Generic) 1 x Per Day/15 Days ary Discharge Instructions: Apply silver alginate to wound bed as instructed Secondary Dressing: Zetuvit Plus Silicone Border Dressing 4x4 (in/in) (DME) (Generic) 1 x Per Day/15 Days Discharge Instructions: Apply silicone border over primary dressing as directed. 1. I would  recommend currently that we have the patient go ahead and continue with the IV antibiotics this seems to be doing an awesome job she is on IV vancomycin and cefepime which is doing a great service for her currently in my opinion. 2. Also can recommend that she should continue with the silver alginate dressings I believe they are doing quite well at this point. 3. I am also going to suggest that she continue to try to keep off her foot is much as possible but also offloading the gluteal area which I think is of utmost importance. We will see patient back for reevaluation in 1 week here in the clinic. If anything worsens or changes patient will contact our office for additional recommendations. I am sending her for chest x-ray today as part of the screening for her HBO therapy. Assuming everything looks good there she should be able to start as soon as we get everything approved from insurance. Electronic Signature(s) Signed: 08/21/2022 1:02:34 PM By: Lenda Kelp PA-C Entered By: Lenda Kelp on 08/21/2022 13:02:34 -------------------------------------------------------------------------------- SuperBill Details Patient Name: Date of Service: SCHYLER, BUTIKOFER 08/21/2022 Medical Record Number: [id number] Patient Account Number: [id number] Date of Birth/Sex: Treating RN: Sep 09, 1966 (56 y.o. [name], [name] Primary Care Provider: [name] Other Clinician: Referring Provider: Treating Provider/Extender: [name] in Treatment: 10 Diagnosis Coding ICD-10 Codes Code Description [phone] Type 2 diabetes mellitus with foot ulcer L97.412 Non-pressure chronic ulcer of right heel and midfoot with fat layer exposed I89.0 Lymphedema, not elsewhere classified Facility Procedures : CPT4 Code: [id number] Description: 97597 - DEBRIDE WOUND 1ST 20 SQ CM OR < ICD-10 Diagnosis Description L97.412 Non-pressure chronic ulcer of right heel and midfoot with fat layer  exposed Modifier: Quantity: 1 Physician Procedures : CPT4 Code Description Modifier [id number] 99214 - WC PHYS LEVEL 4 - EST PT 25 ICD-10 Diagnosis Description Posthumus, Preslee ([id number]) [id number] Page [phone] Type 2 diabetes mellitus with foot ulcer L97.412 Non-pressure chronic  ulcer of right heel and midfoot with fat layer exposed I89.0 Lymphedema, not elsewhere classified Quantity: 1 11 of 11 : [id number] 97597 - WC PHYS DEBR WO ANESTH 20 SQ CM 1 ICD-10 Diagnosis Description L97.412 Non-pressure chronic ulcer of right heel and midfoot with fat layer exposed Quantity: Electronic Signature(s) Signed: 08/21/2022 1:02:53 PM By: Lenda Kelp PA-C Entered By: Lenda Kelp on 08/21/2022 13:02:53

## 2022-08-22 ENCOUNTER — Ambulatory Visit: Payer: Self-pay | Admitting: *Deleted

## 2022-08-22 NOTE — Patient Outreach (Signed)
  Care Coordination   08/22/2022 Name: [name] MRN: [id number] DOB: 29-Feb-1966   Care Coordination Outreach Attempts:  An unsuccessful telephone outreach was attempted for a scheduled appointment today.  Follow Up Plan:  Additional outreach attempts will be made to offer the patient care coordination information and services.   Encounter Outcome:  No Answer  Care Coordination Interventions Activated:  No   Care Coordination Interventions:  No, not indicated    Demetrios Loll, BSN, RN-BC RN Care Coordinator [facility]  [facility] Direct Dial: [phone] Main #: [phone]

## 2022-08-23 NOTE — Progress Notes (Signed)
[name], [name] ([id number]) [id number] Page 1 of 9 Visit Report for 08/21/2022 Arrival Information Details Patient Name: Date of Service: [name], [name] 08/21/2022 10:00 A M Medical Record Number: [id number] Patient Account Number: [id number] Date of Birth/Sex: Treating [name]: December 30, 1965 (56 y.o. F) Primary Care [name]: [name] Other Clinician: Referring [name]: Treating [name]/Extender: [name] in Treatment: 10 Visit Information History Since Last Visit Added or deleted any medications: Yes Patient Arrived: Ambulatory Any new allergies or adverse reactions: No Arrival Time: 10:10 Had a fall or experienced change in No Accompanied By: son activities of daily living that may affect Transfer Assistance: None risk of falls: Patient Identification Verified: Yes Signs or symptoms of abuse/neglect since last visito No Secondary Verification Process Completed: Yes Hospitalized since last visit: No Patient Requires Transmission-Based Precautions: No Implantable device outside of the clinic excluding No Patient Has Alerts: Yes cellular tissue based products placed in the center Patient Alerts: PICC in left arm since last visit: Has Dressing in Place as Prescribed: Yes Pain Present Now: Yes Electronic Signature(s) Signed: 08/21/2022 4:33:40 PM By: [name] Entered By: [name] on 08/21/2022 10:13:31 -------------------------------------------------------------------------------- Encounter Discharge Information Details Patient Name: Date of Service: [name], [name] 08/21/2022 10:00 A M Medical Record Number: [id number] Patient Account Number: [id number] Date of Birth/Sex: Treating [name]: Aug 22, 1966 (56 y.o. [name], [name] Primary Care Izzabella Besse: [name] Other Clinician: Referring Mashonda Broski: Treating [name]/Extender: [name] in Treatment: 10 Encounter Discharge Information Items Post  Procedure Vitals Discharge Condition: Stable Temperature (F): 97.7 Ambulatory Status: Ambulatory Pulse (bpm): 74 Discharge Destination: Home Respiratory Rate (breaths/min): 17 Transportation: Private Auto Blood Pressure (mmHg): 134/74 Accompanied By: son Schedule Follow-up Appointment: Yes Clinical Summary of Care: Patient Declined Electronic Signature(s) Signed: 08/23/2022 12:09:11 PM By: [name] Mu [name] Entered By: [name] Mu on 08/21/2022 11:10:04 [name], [name] ([id number]) [id number] Page 2 of 9 -------------------------------------------------------------------------------- Lower Extremity Assessment Details Patient Name: Date of Service: [name], [name] 08/21/2022 10:00 A M Medical Record Number: [id number] Patient Account Number: [id number] Date of Birth/Sex: Treating [name]: 1966-01-16 (56 y.o. F) Primary Care Ermine Spofford: [name] Other Clinician: Referring Kamica Florance: Treating [name]/Extender: [name] in Treatment: 10 Edema Assessment Assessed: [Left: No] [Right: No] Edema: [Left: Ye] [Right: s] Calf Left: Right: Point of Measurement: 32 cm From Medial Instep 58 cm Ankle Left: Right: Point of Measurement: 10 cm From Medial Instep 38 cm Electronic Signature(s) Signed: 08/21/2022 4:33:40 PM By: [name] Entered By: [name] on 08/21/2022 10:24:26 -------------------------------------------------------------------------------- Multi-Disciplinary Care Plan Details Patient Name: Date of Service: [name], [name] 08/21/2022 10:00 A M Medical Record Number: [id number] Patient Account Number: [id number] Date of Birth/Sex: Treating [name]: July 19, 1966 (56 y.o. [name], [name] Primary Care Fynn Adel: [name] Other Clinician: Referring Jakhiya Brower: Treating Jyll Tomaro/Extender: [name] in Treatment: 10 Active Inactive Wound/Skin Impairment Nursing Diagnoses: Impaired tissue  integrity Knowledge deficit related to ulceration/compromised skin integrity Goals: Patient will have a decrease in wound volume by X% from date: (specify in notes) Date Initiated: 06/12/2022 Target Resolution Date: 08/30/2022 Goal Status: Active Patient/caregiver will verbalize understanding of skin care regimen Date Initiated: 06/12/2022 Target Resolution Date: 09/07/2022 Goal Status: Active Ulcer/skin breakdown will have a volume reduction of 30% by week 4 Date Initiated: 06/12/2022 Date Inactivated: 07/31/2022 Target Resolution Date: 08/03/2022 Goal Status: Unmet Unmet Reason: dx with osteomyelitis. Ulcer/skin breakdown will have a volume reduction of 50% by week 8 Date Initiated: 06/12/2022 Target Resolution Date: 10/05/2022 Goal Status: Active Interventions: Yousuf,  [name] ([id number]) [id number] Page 3 of 9 Assess patient/caregiver ability to obtain necessary supplies Assess patient/caregiver ability to perform ulcer/skin care regimen upon admission and as needed Assess ulceration(s) every visit Notes: Electronic Signature(s) Signed: 08/23/2022 12:09:11 PM By: [name], [name] Entered By: [name], [name] on 08/21/2022 10:03:28 -------------------------------------------------------------------------------- Pain Assessment Details Patient Name: Date of Service: [name], [name] 08/21/2022 10:00 A M Medical Record Number: [id number] Patient Account Number: [id number] Date of Birth/Sex: Treating [name]: 05/19/1966 (56 y.o. F) Primary Care Kyson Kupper: [name] HoppingHasanaj, [name] Other Clinician: Referring [name]: Treating [name]/Extender: [name] MontanaStone III, [name], [name] in Treatment: 10 Active Problems Location of Pain Severity and Description of Pain Patient Has Paino Yes Site Locations Pain Location: Pain in Ulcers Rate the pain. Current Pain Level: 10 Pain Management and Medication Current Pain Management: Electronic Signature(s) Signed: 08/21/2022 4:33:40  PM By: [name] Dallasick, [name] Entered By: [name] Dallasick, [name] on 08/21/2022 10:13:46 -------------------------------------------------------------------------------- Patient/Caregiver Education Details Patient Name: Date of Service: [name], [name] 11/15/2023andnbsp10:00 A M Medical Record Number: [id number] Patient Account Number: [id number] Date of Birth/Gender: Treating [name]: 12/11/1965 (56 y.o. [name] RowanF) Breedlove, [name] Primary Care Physician: [name] HoppingHasanaj, [name] Other Clinician: Referring Physician: Treating Physician/Extender: [name] MontanaStone III, [name], [name] in Treatment: 10 Fischler, Shai ([id number]) [id number] Page 4 of 9 Education Assessment Education Provided To: Patient Education Topics Provided Wound/Skin Impairment: Methods: Explain/Verbal Responses: Reinforcements needed, State content correctly Electronic Signature(s) Signed: 08/23/2022 12:09:11 PM By: [name], [name] Entered By: [name], [name] on 08/21/2022 10:03:41 -------------------------------------------------------------------------------- Wound Assessment Details Patient Name: Date of Service: [name], [name] 08/21/2022 10:00 A M Medical Record Number: [id number] Patient Account Number: [id number] Date of Birth/Sex: Treating [name]: 04/30/1966 (56 y.o. F) Primary Care Recardo Linn: [name] HoppingHasanaj, [name] Other Clinician: Referring Siriah Treat: Treating Kyran Connaughton/Extender: [name] MontanaStone III, [name], [name] in Treatment: 10 Wound Status Wound Number: 1 Primary Etiology: Diabetic Wound/Ulcer of the Lower Extremity Wound Location: Right Calcaneus Wound Status: Open Wounding Event: Gradually Appeared Comorbid History: Hypertension, Type II Diabetes, Osteomyelitis Date Acquired: 11/07/2020 [name] Of Treatment: 10 Clustered Wound: No Photos Wound Measurements Length: (cm) 9.5 Width: (cm) 3.4 Depth: (cm) 0.5 Area: (cm) 25.368 Volume: (cm) 12.684 % Reduction in Area: -118.2% % Reduction in Volume:  -445.5% Epithelialization: None Tunneling: No Undermining: No Wound Description Classification: Grade 3 Wound Margin: Distinct, outline attached Exudate Amount: Large Exudate Type: Serosanguineous Exudate Color: red, brown Foul Odor After Cleansing: Yes Due to Product Use: No Slough/Fibrino Yes Wound Bed Granulation Amount: Large (67-100%) Exposed Structure Granulation Quality: Red, Pink Fascia Exposed: No Setter, Syann ([id number]) [id number] Page 5 of 9 Necrotic Amount: None Present (0%) Fat Layer (Subcutaneous Tissue) Exposed: Yes Tendon Exposed: No Muscle Exposed: No Joint Exposed: No Bone Exposed: No Periwound Skin Texture Texture Color No Abnormalities Noted: No No Abnormalities Noted: No Callus: No Atrophie Blanche: No Crepitus: No Cyanosis: No Excoriation: No Ecchymosis: No Induration: No Erythema: Yes Rash: No Erythema Location: Circumferential Scarring: No Hemosiderin Staining: No Mottled: No Moisture Pallor: No No Abnormalities Noted: No Rubor: No Dry / Scaly: No Maceration: Yes Temperature / Pain Temperature: No Abnormality Tenderness on Palpation: Yes Treatment Notes Wound #1 (Calcaneus) Wound Laterality: Right Cleanser Soap and Water Discharge Instruction: May shower and wash wound with dial antibacterial soap and water prior to dressing change. Wound Cleanser Discharge Instruction: Cleanse the wound with wound cleanser prior to applying a clean dressing using gauze sponges, not tissue or cotton balls. Peri-Wound Care Triamcinolone 15 (g) Discharge Instruction: Use triamcinolone 15 (g) as directed Zinc Oxide Ointment 30g tube Discharge Instruction: Apply Zinc Oxide to  periwound with each dressing change Sween Lotion (Moisturizing lotion) Discharge Instruction: Apply moisturizing lotion as directed Topical Primary Dressing KerraCel Ag Gelling Fiber Dressing, 4x5 in (silver alginate) Discharge Instruction: Apply  silver alginate to wound bed as instructed Secondary Dressing ABD Pad, 5x9 Discharge Instruction: Apply over primary dressing as directed. Drawtex 4x4 in Discharge Instruction: Apply over primary dressing as directed. Woven Gauze Sponge, Non-Sterile 4x4 in Discharge Instruction: Apply over primary dressing as directed. Secured With Elastic Bandage 4 inch (ACE bandage) Discharge Instruction: Secure with ACE bandage as directed. Kerlix Roll Sterile, 4.5x3.1 (in/yd) Discharge Instruction: Secure with Kerlix as directed. 72M Medipore H Soft Cloth Surgical T ape, 4 x 10 (in/yd) Discharge Instruction: Secure with tape as directed. Compression Wrap Compression Stockings Add-Ons Electronic Signature(s) Signed: 08/21/2022 4:33:40 PM By: [name] Entered By: [name] on 08/21/2022 10:25:50 Escue, [name] ([id number]) [id number] Page 6 of 9 -------------------------------------------------------------------------------- Wound Assessment Details Patient Name: Date of Service: [name], [name] 08/21/2022 10:00 A M Medical Record Number: [id number] Patient Account Number: [id number] Date of Birth/Sex: Treating [name]: 1966/08/30 (56 y.o. F) Primary Care Pegeen Stiger: [name] Other Clinician: Referring Maxamillion Banas: Treating Morrison Mcbryar/Extender: [name] in Treatment: 10 Wound Status Wound Number: 2 Primary Etiology: Diabetic Wound/Ulcer of the Lower Extremity Wound Location: Right T Second oe Wound Status: Open Wounding Event: Gradually Appeared Comorbid History: Hypertension, Type II Diabetes, Osteomyelitis Date Acquired: 07/17/2022 [name] Of Treatment: 5 Clustered Wound: No Photos Wound Measurements Length: (cm) 0.1 Width: (cm) 0.1 Depth: (cm) 0.1 Area: (cm) 0.008 Volume: (cm) 0.001 % Reduction in Area: 98.5% % Reduction in Volume: 99.1% Epithelialization: Small (1-33%) Tunneling: No Undermining: No Wound  Description Classification: Grade 2 Wound Margin: Distinct, outline attached Exudate Amount: Medium Exudate Type: Serosanguineous Exudate Color: red, brown Foul Odor After Cleansing: No Slough/Fibrino Yes Wound Bed Granulation Amount: Medium (34-66%) Exposed Structure Necrotic Amount: Medium (34-66%) Fascia Exposed: No Necrotic Quality: Adherent Slough Fat Layer (Subcutaneous Tissue) Exposed: Yes Tendon Exposed: No Muscle Exposed: No Joint Exposed: No Bone Exposed: No Periwound Skin Texture Texture Color No Abnormalities Noted: No No Abnormalities Noted: No Callus: Yes Atrophie Blanche: No Crepitus: No Cyanosis: No Excoriation: No Ecchymosis: No Induration: No Erythema: No Rash: No Hemosiderin Staining: No Scarring: No Mottled: No Pallor: No Moisture Rubor: No No Abnormalities Noted: No Dry / Scaly: Yes Temperature / Pain Cheese, Chelcea ([id number]) [id number] Page 7 of 9 Maceration: No Temperature: No Abnormality Treatment Notes Wound #2 (Toe Second) Wound Laterality: Right Cleanser Soap and Water Discharge Instruction: May shower and wash wound with dial antibacterial soap and water prior to dressing change. Wound Cleanser Discharge Instruction: Cleanse the wound with wound cleanser prior to applying a clean dressing using gauze sponges, not tissue or cotton balls. Peri-Wound Care Triamcinolone 15 (g) Discharge Instruction: Use triamcinolone 15 (g) as directed Zinc Oxide Ointment 30g tube Discharge Instruction: Apply Zinc Oxide to periwound with each dressing change Sween Lotion (Moisturizing lotion) Discharge Instruction: Apply moisturizing lotion as directed Topical Primary Dressing KerraCel Ag Gelling Fiber Dressing, 4x5 in (silver alginate) Discharge Instruction: Apply silver alginate to wound bed as instructed Secondary Dressing Woven Gauze Sponge, Non-Sterile 4x4 in Discharge Instruction: Apply over primary dressing as  directed. Secured With 72M Medipore H Soft Cloth Surgical T ape, 4 x 10 (in/yd) Discharge Instruction: Secure with tape as directed. Compression Wrap Compression Stockings Add-Ons Electronic Signature(s) Signed: 08/21/2022 4:33:40 PM By: [name] Entered By: [name] on 08/21/2022 10:26:49 -------------------------------------------------------------------------------- Wound Assessment Details Patient Name: Date of Service:  [name], [name] 08/21/2022 10:00 A M Medical Record Number: [id number] Patient Account Number: [id number] Date of Birth/Sex: Treating [name]: [birth year] (56 y.o. F) Primary Care Romuald Mccaslin: [name] Other Clinician: Referring Jshaun Abernathy: Treating Niccolo Burggraf/Extender: [name] in Treatment: 10 Wound Status Wound Number: 3 Primary Etiology: Pressure Ulcer Wound Location: Left Gluteus Wound Status: Open Wounding Event: Gradually Appeared Comorbid History: Hypertension, Type II Diabetes, Osteomyelitis Date Acquired: 07/31/2022 [name] Of Treatment: 2 Clustered Wound: No Photos [name], [name] ([id number]) [id number] Page 8 of 9 Wound Measurements Length: (cm) 0.5 Width: (cm) 1 Depth: (cm) 0.1 Area: (cm) 0.393 Volume: (cm) 0.039 % Reduction in Area: 44.4% % Reduction in Volume: 45.1% Epithelialization: Small (1-33%) Tunneling: No Undermining: No Wound Description Classification: Unstageable/Unclassified Wound Margin: Distinct, outline attached Exudate Amount: Medium Exudate Type: Serosanguineous Exudate Color: red, brown Foul Odor After Cleansing: No Slough/Fibrino Yes Wound Bed Necrotic Amount: Large (67-100%) Exposed Structure Necrotic Quality: Adherent Slough Fascia Exposed: No Fat Layer (Subcutaneous Tissue) Exposed: Yes Tendon Exposed: No Muscle Exposed: No Joint Exposed: No Bone Exposed: No Periwound Skin Texture Texture Color No Abnormalities Noted: No No Abnormalities Noted:  No Callus: No Atrophie Blanche: No Crepitus: No Cyanosis: No Excoriation: No Ecchymosis: No Induration: No Erythema: No Rash: No Hemosiderin Staining: No Scarring: No Mottled: No Pallor: No Moisture Rubor: No No Abnormalities Noted: No Dry / Scaly: No Maceration: No Treatment Notes Wound #3 (Gluteus) Wound Laterality: Left Cleanser Peri-Wound Care Skin Prep Discharge Instruction: Use skin prep as directed Topical Primary Dressing KerraCel Ag Gelling Fiber Dressing, 4x5 in (silver alginate) Discharge Instruction: Apply silver alginate to wound bed as instructed Secondary Dressing Zetuvit Plus Silicone Border Dressing 4x4 (in/in) Discharge Instruction: Apply silicone border over primary dressing as directed. Secured With Compression Wrap Compression Stockings [name], [name] ([id number]) [id number] Page 9 of 9 Add-Ons Electronic Signature(s) Signed: 08/21/2022 4:33:40 PM By: [name] Entered By: [name] on 08/21/2022 10:32:56 -------------------------------------------------------------------------------- Vitals Details Patient Name: Date of Service: KALISHA, KEADLE 08/21/2022 10:00 A M Medical Record Number: [id number] Patient Account Number: [id number] Date of Birth/Sex: Treating [name]: 07-05-1966 (56 y.o. F) Primary Care Raelee Rossmann: [name] Other Clinician: Referring Tanija Germani: Treating Peng Thorstenson/Extender: [name] in Treatment: 10 Vital Signs Time Taken: 10:14 Temperature (F): 98.7 Height (in): 65 Pulse (bpm): 91 Weight (lbs): 331 Respiratory Rate (breaths/min): 18 Body Mass Index (BMI): 55.1 Blood Pressure (mmHg): 130/73 Reference Range: 80 - 120 mg / dl Electronic Signature(s) Signed: 08/21/2022 4:33:40 PM By: [name] Entered By: [name] on 08/21/2022 10:16:29

## 2022-08-28 ENCOUNTER — Encounter (HOSPITAL_BASED_OUTPATIENT_CLINIC_OR_DEPARTMENT_OTHER): Payer: No Typology Code available for payment source | Admitting: Physician Assistant

## 2022-08-28 DIAGNOSIS — E11621 Type 2 diabetes mellitus with foot ulcer: Secondary | ICD-10-CM | POA: Diagnosis not present

## 2022-08-28 DIAGNOSIS — L97512 Non-pressure chronic ulcer of other part of right foot with fat layer exposed: Secondary | ICD-10-CM | POA: Diagnosis not present

## 2022-08-28 DIAGNOSIS — L97412 Non-pressure chronic ulcer of right heel and midfoot with fat layer exposed: Secondary | ICD-10-CM | POA: Diagnosis not present

## 2022-08-28 NOTE — Progress Notes (Signed)
[name], [name] Reichmann ([id number].pdf Page 1 of 10 Visit Report for 08/28/2022 Arrival Information Details Patient Name: Date of Service: [name], [name] 08/28/2022 8:45 A M Medical Record Number: [id number] Patient Account Number: [id number] Date of Birth/Sex: Treating RN: Aug 06, 1966 (56 y.o. [name], Meta.Reding Primary Care Earnest Mcgillis: Stoney Bang Other Clinician: Referring Maigan Bittinger: Treating Nishi Neiswonger/Extender: [place] Breeding in Treatment: 11 Visit Information History Since Last Visit All ordered tests and consults were completed: No Patient Arrived: Ambulatory Added or deleted any medications: No Arrival Time: 08:38 Any new allergies or adverse reactions: No Accompanied By: son Had a fall or experienced change in No Transfer Assistance: None activities of daily living that may affect Patient Identification Verified: Yes risk of falls: Secondary Verification Process Completed: Yes Signs or symptoms of abuse/neglect since last visito No Patient Requires Transmission-Based Precautions: No Hospitalized since last visit: No Patient Has Alerts: Yes Implantable device outside of the clinic excluding No Patient Alerts: PICC in left arm cellular tissue based products placed in the center since last visit: Has Dressing in Place as Prescribed: Yes Pain Present Now: Yes Notes per patient has not got the chest x-ray done. Explained she could start Monday for HBO as she has been approved, but need chest x-ray first. Patient in agreement. Electronic Signature(s) Signed: 08/28/2022 10:43:44 AM By: Deon Pilling RN, BSN Entered By: Deon Pilling on 08/28/2022 08:39:44 -------------------------------------------------------------------------------- Clinic Level of Care Assessment Details Patient Name: Date of Service: [name], [name] 08/28/2022 8:45 A M Medical Record Number: [id number] Patient Account Number: [id number] Date of Birth/Sex: Treating  RN: 07-Apr-1966 (56 y.o. [name], [name] Primary Care Chaseton Yepiz: Stoney Bang Other Clinician: Referring Karalyne Nusser: Treating Pranish Akhavan/Extender: [place] Breeding in Treatment: 11 Clinic Level of Care Assessment Items TOOL 4 Quantity Score X- 1 0 Use when only an EandM is performed on FOLLOW-UP visit ASSESSMENTS - Nursing Assessment / Reassessment X- 1 10 Reassessment of Co-morbidities (includes updates in patient status) X- 1 5 Reassessment of Adherence to Treatment Plan ASSESSMENTS - Wound and Skin A ssessment / Reassessment []  - 0 Simple Wound Assessment / Reassessment - one wound X- 3 5 Complex Wound Assessment / Reassessment - multiple wounds []  - 0 Dermatologic / Skin Assessment (not related to wound area) [name], [name] ([id number] Page 2 of 10 ASSESSMENTS - Focused Assessment X- 1 5 Circumferential Edema Measurements - multi extremities []  - 0 Nutritional Assessment / Counseling / Intervention []  - 0 Lower Extremity Assessment (monofilament, tuning fork, pulses) []  - 0 Peripheral Arterial Disease Assessment (using hand held doppler) ASSESSMENTS - Ostomy and/or Continence Assessment and Care []  - 0 Incontinence Assessment and Management []  - 0 Ostomy Care Assessment and Management (repouching, etc.) PROCESS - Coordination of Care []  - 0 Simple Patient / Family Education for ongoing care X- 1 20 Complex (extensive) Patient / Family Education for ongoing care X- 1 10 Staff obtains Programmer, systems, Records, T Results / Process Orders est []  - 0 Staff telephones HHA, Nursing Homes / Clarify orders / etc []  - 0 Routine Transfer to another Facility (non-emergent condition) []  - 0 Routine Hospital Admission (non-emergent condition) []  - 0 New Admissions / Biomedical engineer / Ordering NPWT Apligraf, etc. , []  - 0 Emergency Hospital Admission (emergent condition) X- 1 10 Simple Discharge Coordination []  -  0 Complex (extensive) Discharge Coordination PROCESS - Special Needs []  - 0 Pediatric / Minor Patient Management []  - 0 Isolation Patient Management []  - 0 Hearing / Language / Visual special needs []  -  0 Assessment of Community assistance (transportation, D/C planning, etc.) []  - 0 Additional assistance / Altered mentation []  - 0 Support Surface(s) Assessment (bed, cushion, seat, etc.) INTERVENTIONS - Wound Cleansing / Measurement []  - 0 Simple Wound Cleansing - one wound X- 3 5 Complex Wound Cleansing - multiple wounds X- 1 5 Wound Imaging (photographs - any number of wounds) []  - 0 Wound Tracing (instead of photographs) []  - 0 Simple Wound Measurement - one wound X- 3 5 Complex Wound Measurement - multiple wounds INTERVENTIONS - Wound Dressings []  - 0 Small Wound Dressing one or multiple wounds X- 3 15 Medium Wound Dressing one or multiple wounds []  - 0 Large Wound Dressing one or multiple wounds []  - 0 Application of Medications - topical []  - 0 Application of Medications - injection INTERVENTIONS - Miscellaneous []  - 0 External ear exam []  - 0 Specimen Collection (cultures, biopsies, blood, body fluids, etc.) []  - 0 Specimen(s) / Culture(s) sent or taken to Lab for analysis []  - 0 Patient Transfer (multiple staff / Harrel Lemon Lift / Similar devices) []  - 0 Simple Staple / Suture removal (25 or less) [name], [name] ([id number] Page 3 of 10 []  - 0 Complex Staple / Suture removal (26 or more) []  - 0 Hypo / Hyperglycemic Management (close monitor of Blood Glucose) []  - 0 Ankle / Brachial Index (ABI) - do not check if billed separately X- 1 5 Vital Signs Has the patient been seen at the hospital within the last three years: Yes Total Score: 160 Level Of Care: New/Established - Level 5 Electronic Signature(s) Signed: 08/28/2022 3:58:15 PM By: Rhae Hammock RN Entered By: Rhae Hammock on 08/28/2022  09:52:09 -------------------------------------------------------------------------------- Encounter Discharge Information Details Patient Name: Date of Service: [name], [name] 08/28/2022 8:45 A M Medical Record Number: [id number] Patient Account Number: [id number] Date of Birth/Sex: Treating RN: 17-Jun-1966 (56 y.o. [name], [name] Primary Care Thresea Doble: Stoney Bang Other Clinician: Referring Rashun Grattan: Treating Lochlann Mastrangelo/Extender: Dallas Breeding in Treatment: 11 Encounter Discharge Information Items Discharge Condition: Stable Ambulatory Status: Ambulatory Discharge Destination: Home Transportation: Private Auto Accompanied By: self Schedule Follow-up Appointment: Yes Clinical Summary of Care: Patient Declined Electronic Signature(s) Signed: 08/28/2022 3:58:15 PM By: Rhae Hammock RN Entered By: Rhae Hammock on 08/28/2022 09:54:03 -------------------------------------------------------------------------------- Lower Extremity Assessment Details Patient Name: Date of Service: [name], [name] 08/28/2022 8:45 A M Medical Record Number: [id number] Patient Account Number: [id number] Date of Birth/Sex: Treating RN: 11-29-1965 (56 y.o. Debby Bud Primary Care Aamna Mallozzi: Stoney Bang Other Clinician: Referring Sanjit Mcmichael: Treating Natthew Marlatt/Extender: Matt Holmes Weeks in Treatment: 11 Edema Assessment Assessed: [Left: No] Patrice Paradise: Yes] Edema: [Left: Ye] [Right: s] Calf Left: Right: Point of Measurement: 32 cm From Medial Instep 57 cm Ankle Left: Right: [place], Wai ([id number] Page 4 of 10 Point of Measurement: 10 cm From Medial Instep 35 cm Vascular Assessment Pulses: Dorsalis Pedis Palpable: [Right:Yes] Electronic Signature(s) Signed: 08/28/2022 10:43:44 AM By: Deon Pilling RN, BSN Entered By: Deon Pilling on 08/28/2022  08:45:29 -------------------------------------------------------------------------------- Multi-Disciplinary Care Plan Details Patient Name: Date of Service: [name], [name] 08/28/2022 8:45 A M Medical Record Number: [id number] Patient Account Number: [id number] Date of Birth/Sex: Treating RN: 1966/09/22 (56 y.o. [name], [name] Primary Care Navpreet Szczygiel: Stoney Bang Other Clinician: Referring Amberlyn Martinezgarcia: Treating Clarnce Homan/Extender: Dallas Breeding in Treatment: 11 Active Inactive Wound/Skin Impairment Nursing Diagnoses: Impaired tissue integrity Knowledge deficit related to ulceration/compromised skin integrity Goals: Patient will have a decrease in wound volume by X% from date: (  specify in notes) Date Initiated: 06/12/2022 Target Resolution Date: 08/30/2022 Goal Status: Active Patient/caregiver will verbalize understanding of skin care regimen Date Initiated: 06/12/2022 Target Resolution Date: 09/07/2022 Goal Status: Active Ulcer/skin breakdown will have a volume reduction of 30% by week 4 Date Initiated: 06/12/2022 Date Inactivated: 07/31/2022 Target Resolution Date: 08/03/2022 Goal Status: Unmet Unmet Reason: dx with osteomyelitis. Ulcer/skin breakdown will have a volume reduction of 50% by week 8 Date Initiated: 06/12/2022 Target Resolution Date: 10/05/2022 Goal Status: Active Interventions: Assess patient/caregiver ability to obtain necessary supplies Assess patient/caregiver ability to perform ulcer/skin care regimen upon admission and as needed Assess ulceration(s) every visit Notes: Electronic Signature(s) Signed: 08/28/2022 3:58:15 PM By: Rhae Hammock RN Entered By: Rhae Hammock on 08/28/2022 09:22:21 Pain Assessment Details -------------------------------------------------------------------------------- [name] ([id number]) [id number] Page 5 of 10 Patient Name: Date of Service: [name], [name] 08/28/2022 8:45 A  M Medical Record Number: [id number] Patient Account Number: [id number] Date of Birth/Sex: Treating RN: 1965/12/30 (56 y.o. Debby Bud Primary Care Julisa Flippo: Stoney Bang Other Clinician: Referring Tamitha Norell: Treating Heinz Eckert/Extender: Dallas Breeding in Treatment: 11 Active Problems Location of Pain Severity and Description of Pain Patient Has Paino Yes Site Locations Rate the pain. Current Pain Level: 9 Pain Management and Medication Current Pain Management: Medication: No Cold Application: No Rest: No Massage: No Activity: No T.E.N.S.: No Heat Application: No Leg drop or elevation: No Is the Current Pain Management Adequate: Adequate How does your wound impact your activities of daily livingo Sleep: No Bathing: No Appetite: No Relationship With Others: No Bladder Continence: No Emotions: No Bowel Continence: No Work: No Toileting: No Drive: No Dressing: No Hobbies: No Electronic Signature(s) Signed: 08/28/2022 10:43:44 AM By: Deon Pilling RN, BSN Entered By: Deon Pilling on 08/28/2022 08:41:14 -------------------------------------------------------------------------------- Patient/Caregiver Education Details Patient Name: Date of Service: [name] 11/22/2023andnbsp8:45 A M Medical Record Number: [id number] Patient Account Number: [id number] Date of Birth/Gender: Treating RN: Jul 22, 1966 (56 y.o. [name], [name] Primary Care Physician: Stoney Bang Other Clinician: Referring Physician: Treating Physician/Extender: Dallas Breeding in Treatment: 11 Education Assessment Education Provided To: Patient Education Topics Provided Sumner, Finley ([id number]) [id number] Page 6 of 10 Wound Debridement: Methods: Explain/Verbal Responses: Reinforcements needed, State content correctly Electronic Signature(s) Signed: 08/28/2022 3:58:15 PM By: Rhae Hammock RN Entered By: Rhae Hammock on 08/28/2022 09:22:34 -------------------------------------------------------------------------------- Wound Assessment Details Patient Name: Date of Service: [name], [name] 08/28/2022 8:45 A M Medical Record Number: [id number] Patient Account Number: [id number] Date of Birth/Sex: Treating RN: 29-Jun-1966 (56 y.o. [name], Meta.Reding Primary Care Issa Luster: Stoney Bang Other Clinician: Referring Tiwana Chavis: Treating Johari Pinney/Extender: Dallas Breeding in Treatment: 11 Wound Status Wound Number: 1 Primary Etiology: Diabetic Wound/Ulcer of the Lower Extremity Wound Location: Right Calcaneus Wound Status: Open Wounding Event: Gradually Appeared Comorbid History: Hypertension, Type II Diabetes, Osteomyelitis Date Acquired: 11/07/2020 Weeks Of Treatment: 11 Clustered Wound: No Photos Wound Measurements Length: (cm) 9.5 Width: (cm) 2.8 Depth: (cm) 0.5 Area: (cm) 20.892 Volume: (cm) 10.446 % Reduction in Area: -79.7% % Reduction in Volume: -349.3% Epithelialization: Small (1-33%) Tunneling: No Undermining: Yes Starting Position (o'clock): 10 Ending Position (o'clock): 11 Maximum Distance: (cm) 0.4 Wound Description Classification: Grade 3 Wound Margin: Distinct, outline attached Exudate Amount: Large Exudate Type: Serosanguineous Exudate Color: red, brown Foul Odor After Cleansing: No Slough/Fibrino Yes Wound Bed Granulation Amount: Large (67-100%) Exposed Structure Granulation Quality: Red, Pink Fascia Exposed: No Necrotic Amount: Small (1-33%) Fat Layer (Subcutaneous Tissue) Exposed: Yes Necrotic Quality: Adherent Slough Tendon  Exposed: No Muscle Exposed: No Joint Exposed: No Tomei, Tarika ([id number] Page 7 of 10 Bone Exposed: Yes Periwound Skin Texture Texture Color No Abnormalities Noted: No No Abnormalities Noted: No Callus: No Atrophie Blanche: No Crepitus: No Cyanosis: No Excoriation:  No Ecchymosis: No Induration: No Erythema: Yes Rash: No Erythema Location: Circumferential Scarring: No Erythema Change: No Change Hemosiderin Staining: No Moisture Mottled: No No Abnormalities Noted: No Pallor: No Dry / Scaly: No Rubor: No Maceration: Yes Temperature / Pain Temperature: No Abnormality Tenderness on Palpation: Yes Treatment Notes Wound #1 (Calcaneus) Wound Laterality: Right Cleanser Soap and Water Discharge Instruction: May shower and wash wound with dial antibacterial soap and water prior to dressing change. Wound Cleanser Discharge Instruction: Cleanse the wound with wound cleanser prior to applying a clean dressing using gauze sponges, not tissue or cotton balls. Peri-Wound Care Triamcinolone 15 (g) Discharge Instruction: Use triamcinolone 15 (g) as directed Zinc Oxide Ointment 30g tube Discharge Instruction: Apply Zinc Oxide to periwound with each dressing change Sween Lotion (Moisturizing lotion) Discharge Instruction: Apply moisturizing lotion as directed Topical Primary Dressing KerraCel Ag Gelling Fiber Dressing, 4x5 in (silver alginate) Discharge Instruction: Apply silver alginate to wound bed as instructed Secondary Dressing ABD Pad, 5x9 Discharge Instruction: Apply over primary dressing as directed. Drawtex 4x4 in Discharge Instruction: Apply over primary dressing as directed. Woven Gauze Sponge, Non-Sterile 4x4 in Discharge Instruction: Apply over primary dressing as directed. Secured With Elastic Bandage 4 inch (ACE bandage) Discharge Instruction: Secure with ACE bandage as directed. Kerlix Roll Sterile, 4.5x3.1 (in/yd) Discharge Instruction: Secure with Kerlix as directed. 23M Medipore H Soft Cloth Surgical T ape, 4 x 10 (in/yd) Discharge Instruction: Secure with tape as directed. Compression Wrap Compression Stockings Add-Ons Electronic Signature(s) Signed: 08/28/2022 10:43:44 AM By: Deon Pilling RN, BSN Entered By: Deon Pilling on 08/28/2022 08:49:05 [name], [name] ([id number] Page 8 of 10 -------------------------------------------------------------------------------- Wound Assessment Details Patient Name: Date of Service: [name], [name] 08/28/2022 8:45 A M Medical Record Number: [id number] Patient Account Number: [id number] Date of Birth/Sex: Treating RN: 1965-12-24 (56 y.o. [name], Meta.Reding Primary Care Ichael Pullara: Stoney Bang Other Clinician: Referring Cella Cappello: Treating Concettina Leth/Extender: Dallas Breeding in Treatment: 11 Wound Status Wound Number: 2 Primary Etiology: Diabetic Wound/Ulcer of the Lower Extremity Wound Location: Right T Second oe Wound Status: Open Wounding Event: Gradually Appeared Comorbid History: Hypertension, Type II Diabetes, Osteomyelitis Date Acquired: 07/17/2022 Weeks Of Treatment: 6 Clustered Wound: No Photos Wound Measurements Length: (cm) Width: (cm) Depth: (cm) Area: (cm) Volume: (cm) 0 % Reduction in Area: 100% 0 % Reduction in Volume: 100% 0 Epithelialization: Large (67-100%) 0 Tunneling: No 0 Undermining: No Wound Description Classification: Grade 2 Wound Margin: Distinct, outline attached Exudate Amount: None Present Foul Odor After Cleansing: No Slough/Fibrino No Wound Bed Granulation Amount: None Present (0%) Exposed Structure Necrotic Amount: None Present (0%) Fascia Exposed: No Fat Layer (Subcutaneous Tissue) Exposed: No Tendon Exposed: No Muscle Exposed: No Joint Exposed: No Bone Exposed: No Periwound Skin Texture Texture Color No Abnormalities Noted: No No Abnormalities Noted: No Callus: Yes Atrophie Blanche: No Crepitus: No Cyanosis: No Excoriation: No Ecchymosis: No Induration: No Erythema: No Rash: No Hemosiderin Staining: No Scarring: No Mottled: No Pallor: No Moisture Rubor: No No Abnormalities Noted: No Dry / Scaly: No Temperature / Pain Maceration:  No Temperature: No Abnormality Electronic Signature(s) Bundick, Ladonne ([id number] Page 9 of 10 Signed: 08/28/2022 10:43:44 AM By: Deon Pilling RN, BSN Entered By: Deon Pilling on 08/28/2022 08:49:47 -------------------------------------------------------------------------------- Wound  Assessment Details Patient Name: Date of Service: [name], [name] 08/28/2022 8:45 A M Medical Record Number: [id number] Patient Account Number: [id number] Date of Birth/Sex: Treating RN: March 09, 1966 (56 y.o. [name], Millard.Loa Primary Care Lashann Hagg: [name] Other Clinician: Referring Tiya Schrupp: Treating Jackie Russman/Extender: [name] in Treatment: 11 Wound Status Wound Number: 3 Primary Etiology: Pressure Ulcer Wound Location: Left Gluteus Wound Status: Open Wounding Event: Gradually Appeared Comorbid History: Hypertension, Type II Diabetes, Osteomyelitis Date Acquired: 07/31/2022 Weeks Of Treatment: 3 Clustered Wound: No Photos Wound Measurements Length: (cm) Width: (cm) Depth: (cm) Area: (cm) Volume: (cm) 0 % Reduction in Area: 100% 0 % Reduction in Volume: 100% 0 Epithelialization: Large (67-100%) 0 Tunneling: No 0 Undermining: No Wound Description Classification: Unstageable/Unclassified Wound Margin: Distinct, outline attached Exudate Amount: None Present Foul Odor After Cleansing: No Slough/Fibrino No Wound Bed Granulation Amount: None Present (0%) Exposed Structure Necrotic Amount: None Present (0%) Fascia Exposed: No Fat Layer (Subcutaneous Tissue) Exposed: No Tendon Exposed: No Muscle Exposed: No Joint Exposed: No Bone Exposed: No Periwound Skin Texture Texture Color No Abnormalities Noted: No No Abnormalities Noted: No Callus: No Atrophie Blanche: No Crepitus: No Cyanosis: No Excoriation: No Ecchymosis: No Induration: No Erythema: No Rash: No Hemosiderin Staining: No Scarring: No Mottled:  No Pallor: No Moisture Rubor: No No Abnormalities Noted: No [name], [name] ([id number]) [id number].pdf Page 10 of 10 Dry / Scaly: No Maceration: No Electronic Signature(s) Signed: 08/28/2022 10:43:44 AM By: Shawn Stall RN, BSN Entered By: Shawn Stall on 08/28/2022 08:44:12 -------------------------------------------------------------------------------- Vitals Details Patient Name: Date of Service: [name], [name] 08/28/2022 8:45 A M Medical Record Number: [id number] Patient Account Number: [id number] Date of Birth/Sex: Treating RN: 1966/04/06 (56 y.o. [name], Millard.Loa Primary Care Isrrael Fluckiger: [name] Other Clinician: Referring Raji Glinski: Treating Natesha Hassey/Extender: [name] in Treatment: 11 Vital Signs Time Taken: 08:40 Temperature (F): 99.2 Height (in): 65 Pulse (bpm): 92 Weight (lbs): 331 Respiratory Rate (breaths/min): 20 Body Mass Index (BMI): 55.1 Blood Pressure (mmHg): 128/84 Capillary Blood Glucose (mg/dl): 716 Reference Range: 80 - 120 mg / dl Electronic Signature(s) Signed: 08/28/2022 10:43:44 AM By: Shawn Stall RN, BSN Entered By: Shawn Stall on 08/28/2022 08:40:49

## 2022-08-28 NOTE — Progress Notes (Addendum)
Tesoriero, [name] ([id number]) [id number] Page 1 of 9 Visit Report for 08/28/2022 Chief Complaint Document Details Patient Name: Date of Service: [name], [name] 08/28/2022 8:45 A M Medical Record Number: [id number] Patient Account Number: [id number] Date of Birth/Sex: Treating RN: 30-Jun-1966 (56 y.o. F) Primary Care Provider: [name] Other Clinician: Referring Provider: Treating Provider/Extender: [name] in Treatment: 11 Information Obtained from: Patient Chief Complaint Right heel ulcer Electronic Signature(s) Signed: 08/28/2022 9:15:45 AM By: Lenda Kelp PA-C Entered By: Lenda Kelp on 08/28/2022 09:15:45 -------------------------------------------------------------------------------- HPI Details Patient Name: Date of Service: [name], [name] 08/28/2022 8:45 A M Medical Record Number: [id number] Patient Account Number: [id number] Date of Birth/Sex: Treating RN: 05/12/1966 (56 y.o. F) Primary Care Provider: [name] Other Clinician: Referring Provider: Treating Provider/Extender: [name] in Treatment: 11 History of Present Illness HPI Description: 06-13-2022 upon evaluation today patient presents for evaluation of her right heel ulcer. She is having a tremendous amount of pain at this location. She tells me that her most recent hemoglobin A1c was 8.7 and that was on 08-23-2021. This ulcer on the heel has been present she states since February 2023 when she had an injection to the heel by podiatry. She states that she began to have increasing pain the wound opened and has been open ever since. 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 She had an x-ray in June if anything can although there did not appear to be any obvious evidence of osteomyelitis at that point. She subsequently has had OR debridement several times of the wound performed by podiatry. Patient does  have a history of lymphedema of the lower extremities she is also a type II diabetic. 06-19-2022 upon evaluation today patient appears to be doing better in regard to the size of her leg which is significantly improved. With that being said she had a lot of drainage from the heel which is completely understandable considering what is going on at this time. There does not appear to be any evidence of active infection there is no warmth or irritation to the leg in general. With that being said the patient does have an issue here with the amount of drainage that is going on I definitely think Zetuvit is good to be the better way to go in place of ABD pads. Also think that she could potentially benefit from 3 times per week dressing changes but again right now we are going to stick with the to change it today, change in her Friday, and then subsequently seeing where things stand next Wednesday. The other option would be to change her to a different day for wound care having her come then say like a Tuesday Friday or Monday Thursday. 06-26-2022 upon evaluation patient's wound bed actually showed signs of doing well in regard to the overall size it was measuring slightly smaller. With that being said unfortunately the biggest issue we see is she still has a tremendous amount of drainage which is what could keep this from being able to use a total contact cast. For that reason I am did going to discuss with her today the possibility of trying Tubigrip to see if that could be of benefit for her. She voiced understanding and is in agreement with giving this a try. 07-03-2022 upon evaluation today patient unfortunately appears to be doing significantly worse at this point in regard to her swelling due to the fact that she was unable to really  wear the Tubigrip. She tells me that it was cutting into her. With that being said she also did have an MRI this MRI revealed that she does have osteomyelitis in the heel this  was actually just performed Monday, 25 September. This does show signs of "early osteomyelitis". Nonetheless this is still concerning to me. The wound also appears to be getting deeper in the center aspect of this which has me a little concerned as well. I do believe that she is likely going require an aggressive approach here to try to get things better. I discussed that in greater detail with her today. I will detailed in the plan. 07-17-2022 upon evaluation today patient appears to be doing poorly in regard to her wound. Unfortunately she does not show any signs of infection systemically though locally this seems to be doing worse with the wound actually being bigger than where it was previous. She did have an MRI that showed evidence of osteomyelitis actually recommended a referral to ID she was evaluated infectious disease and they recommended referral to podiatry and to be honest have recommended amputation based on what the patient tells me today. With that being said this is something that she is not interested in at all to be Mitchener, Cade ([id number]) [id number] Page 2 of 9 perfectly honest. For that reason she wants to know what she should do and where she should go at this point that is where the majority of the conversation which was quite lengthy today went. Obviously understand her concern here but I think she is going to have to definitely get off this and likely this means she is going to need to come out of work. 10/18; Since the patient was the patient is using Hydrofera Blue on her right heel wound. She has a new larger open area superior to the wound. The skin on the bottom of her foot is completely macerated. I reviewed the infectious disease note on this patient from 10/9. They recommended keeping the patient on Delofloxacin 450 twice daily until follow-up tomorrow. The patient states she is not going back there as the only thing they wanted to do was  "cut my leg off]. She has not seen podiatry. Her lab works shows a CRP markedly elevated at 240.2 a sedimentation rate of 96 the rest of her CBC is normal creatinine of 0.38 The patient has been to see her primary doctor who is Dr. Leonides Schanz [SPo] In [place]. According the patient Dr. Loralee Pacas has ordered vancomycin and Zosyn to start on November 1. She is not taking the delofloxacin that was suggested by infectious disease. Very angry that they just wanted to consider her for an amputation although I do not specifically see that stated in their note 07-31-2022 upon evaluation today patient still has a significant wound over her plantar aspect of her right l foot region. With that being said I am definitely concerned about the fact that the patient probably does need to be in hyperbaric oxygen therapy at this point. I think we should try to get this going as quickly as possible. She actually is going to be set up with a PICC line next Wednesday and then following this we will have the vancomycin and Zosyn that she will be taking for 8 weeks according to what she tells me. I think this is definitely going to be beneficial and again the goal here is limb salvage. In combination with that I think the hyperbaric oxygen therapy would be ultimately significantly  helpful for her. 08-07-2022 upon evaluation today patient appears to still be doing quite poorly overall she still has not gotten her antibiotics. She got her PICC line today which I assumed she will be getting the first dose of antibiotics as well during that time. With that being said unfortunately she did not get the antibiotics and in fact as I questioned her further she does not even know when or if that is going to be started this week. Again I am very concerned about this considering she already has a PICC line in place we do not want this to clot off on top of the fact that she should already be on these antibiotics she just never went to the ER  for evaluation therefore they never got this started. At this point based on what I am seeing I really think that the ideal thing would be for [place] to see about getting her to the ER for further evaluation and treatment to see if they can get this started for her as soon as possible. The patient voiced understanding and is in agreement with the plan. I gave her recommendations for what should be done also called [facility] infusion therapy in order to see if they would be the ones that seem to be available for her IV infusions but they tell me that they no longer do this that is apparently where the orders were sent to for her infusion therapy. That was by her primary care provider. 08-21-2022 upon evaluation today patient's wound on the bottom of the heel and foot area as well as the toe appear to be doing significantly better. Fortunately I do not see any evidence of infection locally or systemically and everything is measuring much smaller than where we have been. The wound in the left gluteal region also is dramatically improved compared to last time she was here. She has started the antibiotics this is cefepime and vancomycin. She did have a somewhat high trough and therefore they have her hold that today and they are to recheck in the morning I believe she told me. Nonetheless with the antibiotics going she seems to be doing significantly better which is great news. 08-28-2022 upon evaluation today patient appears to be doing somewhat better in regard to her foot there is definitely less drainage than what we have seen in the past. I do believe the antibiotics are helping. With that being said she is also been approved for hyperbaric oxygen therapy and I think as soon as we get this started the better. I discussed that with her today as well. With that being said I do think that the last thing she needs is her chest x-ray when she has that done will be ready to start her into treatment. She is going to  try to go get that today when she leaves which I think would be ideal. Electronic Signature(s) Signed: 08/28/2022 9:59:48 AM By: Lenda Kelp PA-C Entered By: Lenda Kelp on 08/28/2022 09:59:48 -------------------------------------------------------------------------------- Physical Exam Details Patient Name: Date of Service: [name], [name] 08/28/2022 8:45 A M Medical Record Number: [id number] Patient Account Number: [id number] Date of Birth/Sex: Treating RN: 02-02-1966 (56 y.o. F) Primary Care Provider: [name] Other Clinician: Referring Provider: Treating Provider/Extender: [name] in Treatment: 11 Constitutional Well-nourished and well-hydrated in no acute distress. Respiratory normal breathing without difficulty. Psychiatric this patient is able to make decisions and demonstrates good insight into disease process. Alert and Oriented x 3. pleasant  and cooperative. Notes Upon inspection patient's wound is showing signs of good improvement. With that being said I do not see any signs of infection systemically though locally there is some evidence of infection still present is why she is on the antibiotics and again we do know she has osteomyelitis as well. Electronic Signature(s) Signed: 08/28/2022 10:00:25 AM By: Lenda KelpStone III, Richar Dunklee PA-C Entered By: Lenda KelpStone III, Jebediah Macrae on 08/28/2022 10:00:24 Urquilla, Arline AspINDY ([id number]) [id number] Page 3 of 9 -------------------------------------------------------------------------------- Physician Orders Details Patient Name: Date of Service: Darral DashDLEW, Braxton 08/28/2022 8:45 A M Medical Record Number: [id number] Patient Account Number: [id number] Date of Birth/Sex: Treating RN: 01/29/1966 (56 y.o. Ardis RowanF) Breedlove, [name] Primary Care Provider: Lia HoppingHasanaj, Xaje Other Clinician: Referring Provider: Treating Provider/Extender: Laurann MontanaStone III, Murdock Jellison Hasanaj, Xaje Weeks in Treatment: 11 Verbal / Phone Orders:  No Diagnosis Coding ICD-10 Coding Code Description E11.621 Type 2 diabetes mellitus with foot ulcer L97.412 Non-pressure chronic ulcer of right heel and midfoot with fat layer exposed I89.0 Lymphedema, not elsewhere classified Follow-up Appointments ppointment in 1 week. - Wednesday w/ Allen DerryHoyt Stone Return A Other: - Ensure you continue oral antibiotics until PICC line place, then start and finish all IV antibiotics- Vancomycin and Zosyn (ordered primary care provider). Approved for HBO-pt. to start Monday. Chest x-ray needed- pt. to get done on Friday 11/25 Anesthetic (In clinic) Topical Lidocaine 5% applied to wound bed Bathing/ Shower/ Hygiene May shower with protection but do not get wound dressing(s) wet. Edema Control - Lymphedema / SCD / Other Elevate legs to the level of the heart or above for 30 minutes daily and/or when sitting, a frequency of: - 3-4 times a day throughout the day. Avoid standing for long periods of time. Off-Loading Open toe surgical shoe to: - w/ peg assist Other: - Minimize walking and standing as much as possible to right foot to aid in offloading to wound. While at work use the wheelchair for mobility. Hyperbaric Oxygen Therapy Wound #1 Right Calcaneus Evaluate for HBO Therapy Indication: - Wagner Grade 3 to right calcaneus If appropriate for treatment, begin HBOT per protocol: 2.5 ATA for 90 Minutes with 2 Five (5) Minute A Breaks ir Total Number of Treatments: - 40 One treatments per day (delivered Monday through Friday unless otherwise specified in Special Instructions below): Finger stick Blood Glucose Pre- and Post- HBOT Treatment. Follow Hyperbaric Oxygen Glycemia Protocol A frin (Oxymetazoline HCL) 0.05% nasal spray - 1 spray in both nostrils daily as needed prior to HBO treatment for difficulty clearing ears Wound Treatment Wound #1 - Calcaneus Wound Laterality: Right Cleanser: Soap and Water 1 x Per Day/15 Days Discharge Instructions: May  shower and wash wound with dial antibacterial soap and water prior to dressing change. Cleanser: Wound Cleanser (Generic) 1 x Per Day/15 Days Discharge Instructions: Cleanse the wound with wound cleanser prior to applying a clean dressing using gauze sponges, not tissue or cotton balls. Peri-Wound Care: Triamcinolone 15 (g) 1 x Per Day/15 Days Discharge Instructions: Use triamcinolone 15 (g) as directed Peri-Wound Care: Zinc Oxide Ointment 30g tube 1 x Per Day/15 Days Discharge Instructions: Apply Zinc Oxide to periwound with each dressing change Peri-Wound Care: Sween Lotion (Moisturizing lotion) 1 x Per Day/15 Days Discharge Instructions: Apply moisturizing lotion as directed Prim Dressing: KerraCel Ag Gelling Fiber Dressing, 4x5 in (silver alginate) (Generic) 1 x Per Day/15 Days ary Gottsch, Taylormarie ([id number]) [id number] Page 4 of 9 Discharge Instructions: Apply silver alginate to wound bed as instructed Secondary Dressing: ABD Pad, 5x9 (Generic) 1 x Per  Day/15 Days Discharge Instructions: Apply over primary dressing as directed. Secondary Dressing: Drawtex 4x4 in (Generic) 1 x Per Day/15 Days Discharge Instructions: Apply over primary dressing as directed. Secondary Dressing: Woven Gauze Sponge, Non-Sterile 4x4 in (Generic) 1 x Per Day/15 Days Discharge Instructions: Apply over primary dressing as directed. Secured With: Elastic Bandage 4 inch (ACE bandage) (Generic) 1 x Per Day/15 Days Discharge Instructions: Secure with ACE bandage as directed. Secured With: [employer], 4.5x3.1 (in/yd) (Generic) 1 x Per Day/15 Days Discharge Instructions: Secure with Kerlix as directed. Secured With: 55M Medipore H Soft Cloth Surgical T ape, 4 x 10 (in/yd) (Generic) 1 x Per Day/15 Days Discharge Instructions: Secure with tape as directed. Custom Services Screening EKG - Patient needs a screening EKG prior to starting HBO therapy - (ICD10 E11.621 - Type 2 diabetes  mellitus with foot ulcer) GLYCEMIA INTERVENTIONS PROTOCOL PRE-HBO GLYCEMIA INTERVENTIONS ACTION INTERVENTION Obtain pre-HBO capillary blood glucose (ensure 1 physician order is in chart). A. Notify HBO physician and await physician orders. 2 If result is 70 mg/dl or below: B. If the result meets the hospital definition of a critical result, follow hospital policy. A. Give patient an 8 ounce Glucerna Shake, an 8 ounce Ensure, or 8 ounces of a Glucerna/Ensure equivalent dietary supplement*. B. Wait 30 minutes. If result is 71 mg/dl to 161 mg/dl: C. Retest patients capillary blood glucose (CBG). D. If result greater than or equal to 110 mg/dl, proceed with HBO. If result less than 110 mg/dl, notify HBO physician and consider holding HBO. If result is 131 mg/dl to 096 mg/dl: A. Proceed with HBO. A. Notify HBO physician and await physician orders. B. It is recommended to hold HBO and do If result is 250 mg/dl or greater: blood/urine ketone testing. C. If the result meets the hospital definition of a critical result, follow hospital policy. POST-HBO GLYCEMIA INTERVENTIONS ACTION INTERVENTION Obtain post HBO capillary blood glucose (ensure 1 physician order is in chart). A. Notify HBO physician and await physician orders. 2 If result is 70 mg/dl or below: B. If the result meets the hospital definition of a critical result, follow hospital policy. A. Give patient an 8 ounce Glucerna Shake, an 8 ounce Ensure, or 8 ounces of a Glucerna/Ensure equivalent dietary supplement*. B. Wait 15 minutes for symptoms of If result is 71 mg/dl to 045 mg/dl: hypoglycemia (i.e. nervousness, anxiety, sweating, chills, clamminess, irritability, confusion, tachycardia or dizziness). C. If patient asymptomatic, discharge patient. If patient symptomatic, repeat capillary blood glucose (CBG) and notify HBO physician. If result is 101 mg/dl to 409 mg/dl: A. Discharge patient. A. Notify HBO  physician and await physician orders. B. It is recommended to do blood/urine ketone If result is 250 mg/dl or greater: testing. C. If the result meets the hospital definition of a critical result, follow hospital policy. *Juice or candies are NOT equivalent products. If patient refuses the Glucerna or Ensure, please consult the hospital dietitian for an appropriate substitute. Electronic Signature(s) Signed: 08/28/2022 2:28:12 PM By: Buren Kos Yanes, Leigh ([id number]) [id number] Page 5 of 9 Signed: 08/28/2022 2:28:12 PM By: Lenda Kelp PA-C Entered By: Lenda Kelp on 08/28/2022 14:28:12 Prescription 08/28/2022 -------------------------------------------------------------------------------- Erick Blinks PA Patient Name: Provider: 1966/09/02 [id number] Date of Birth: NPI#: [id number] Sex: DEA #: [id number] [phone] Phone #: License #: Eligha Bridegroom [facility] Patient Address: [address] [place], [place] [address] [address] [place], [place] [address] [phone]  Allergies No Known Allergies Provider's Orders Screening EKG - ICD10: E11.621 - Patient needs a screening EKG prior to starting HBO therapy Hand Signature: Date(s): Electronic Signature(s) Signed: 08/28/2022 2:30:14 PM By: Lenda Kelp PA-C Entered By: Lenda Kelp on 08/28/2022 14:28:12 -------------------------------------------------------------------------------- Problem List Details Patient Name: Date of Service: [name], [name] 08/28/2022 8:45 A M Medical Record Number: [id number] Patient Account Number: [id number] Date of Birth/Sex: Treating RN: May 21, 1966 (56 y.o. F) Primary Care Provider: [name] Other Clinician: Referring Provider: Treating Provider/Extender: [name] in Treatment: 11 Active Problems ICD-10 Encounter Code Description Active Date  MDM Diagnosis [phone] Other chronic osteomyelitis, right ankle and foot 08/28/2022 No Yes E11.621 Type 2 diabetes mellitus with foot ulcer 06/12/2022 No Yes L97.412 Non-pressure chronic ulcer of right heel and midfoot with fat layer exposed 06/12/2022 No Yes I89.0 Lymphedema, not elsewhere classified 06/12/2022 No Yes Bennetts, Cariah ([id number]) [id number] Page 6 of 9 Inactive Problems Resolved Problems Electronic Signature(s) Signed: 08/28/2022 2:29:01 PM By: Lenda Kelp PA-C Previous Signature: 08/28/2022 9:15:36 AM Version By: Lenda Kelp PA-C Entered By: Lenda Kelp on 08/28/2022 14:29:01 -------------------------------------------------------------------------------- Progress Note Details Patient Name: Date of Service: KENZEE, BASSIN 08/28/2022 8:45 A M Medical Record Number: [id number] Patient Account Number: [id number] Date of Birth/Sex: Treating RN: 09/22/1966 (56 y.o. F) Primary Care Provider: [name] Other Clinician: Referring Provider: Treating Provider/Extender: [name] in Treatment: 11 Subjective Chief Complaint Information obtained from Patient Right heel ulcer History of Present Illness (HPI) 06-13-2022 upon evaluation today patient presents for evaluation of her right heel ulcer. She is having a tremendous amount of pain at this location. She tells me that her most recent hemoglobin A1c was 8.7 and that was on 08-23-2021. This ulcer on the heel has been present she states since February 2023 when she had an injection to the heel by podiatry. She states that she began to have increasing pain the wound opened and has been open ever since. 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 She had an x-ray in June if anything can although there did not appear to be any obvious evidence of osteomyelitis at that point. She subsequently has had OR debridement several times of the wound performed  by podiatry. Patient does have a history of lymphedema of the lower extremities she is also a type II diabetic. 06-19-2022 upon evaluation today patient appears to be doing better in regard to the size of her leg which is significantly improved. With that being said she had a lot of drainage from the heel which is completely understandable considering what is going on at this time. There does not appear to be any evidence of active infection there is no warmth or irritation to the leg in general. With that being said the patient does have an issue here with the amount of drainage that is going on I definitely think Zetuvit is good to be the better way to go in place of ABD pads. Also think that she could potentially benefit from 3 times per week dressing changes but again right now we are going to stick with the to change it today, change in her Friday, and then subsequently seeing where things stand next Wednesday. The other option would be to change her to a different day for wound care having her come then say like a Tuesday Friday or Monday Thursday. 06-26-2022 upon evaluation patient's wound bed actually showed signs of doing well in  regard to the overall size it was measuring slightly smaller. With that being said unfortunately the biggest issue we see is she still has a tremendous amount of drainage which is what could keep this from being able to use a total contact cast. For that reason I am did going to discuss with her today the possibility of trying Tubigrip to see if that could be of benefit for her. She voiced understanding and is in agreement with giving this a try. 07-03-2022 upon evaluation today patient unfortunately appears to be doing significantly worse at this point in regard to her swelling due to the fact that she was unable to really wear the Tubigrip. She tells me that it was cutting into her. With that being said she also did have an MRI this MRI revealed that she does  have osteomyelitis in the heel this was actually just performed Monday, 25 September. This does show signs of "early osteomyelitis". Nonetheless this is still concerning to me. The wound also appears to be getting deeper in the center aspect of this which has me a little concerned as well. I do believe that she is likely going require an aggressive approach here to try to get things better. I discussed that in greater detail with her today. I will detailed in the plan. 07-17-2022 upon evaluation today patient appears to be doing poorly in regard to her wound. Unfortunately she does not show any signs of infection systemically though locally this seems to be doing worse with the wound actually being bigger than where it was previous. She did have an MRI that showed evidence of osteomyelitis actually recommended a referral to ID she was evaluated infectious disease and they recommended referral to podiatry and to be honest have recommended amputation based on what the patient tells me today. With that being said this is something that she is not interested in at all to be perfectly honest. For that reason she wants to know what she should do and where she should go at this point that is where the majority of the conversation which was quite lengthy today went. Obviously understand her concern here but I think she is going to have to definitely get off this and likely this means she is going to need to come out of work. 10/18; Since the patient was the patient is using Hydrofera Blue on her right heel wound. She has a new larger open area superior to the wound. The skin on the bottom of her foot is completely macerated. I reviewed the infectious disease note on this patient from 10/9. They recommended keeping the patient on Delofloxacin 450 twice daily until follow-up tomorrow. The patient states she is not going back there as the only thing they wanted to do was "cut my leg off]. She has not seen  podiatry. Her lab works shows a CRP markedly elevated at 240.2 a sedimentation rate of 96 the rest of her CBC is normal creatinine of 0.38 The patient has been to see her primary doctor who is Dr. Leonides Schanz [SP[facility]] In [place]. According the patient Dr. Loralee Pacas has ordered vancomycin and Zosyn to start on November 1. She is not taking the delofloxacin that was suggested by infectious disease. Very angry that they just wanted to consider her for an amputation although I do not specifically see that stated in their note [name], [name] ([id number]) [id number] Page 7 of 9 07-31-2022 upon evaluation today patient still has a significant wound over her plantar aspect of her  right l foot region. With that being said I am definitely concerned about the fact that the patient probably does need to be in hyperbaric oxygen therapy at this point. I think we should try to get this going as quickly as possible. She actually is going to be set up with a PICC line next Wednesday and then following this we will have the vancomycin and Zosyn that she will be taking for 8 weeks according to what she tells me. I think this is definitely going to be beneficial and again the goal here is limb salvage. In combination with that I think the hyperbaric oxygen therapy would be ultimately significantly helpful for her. 08-07-2022 upon evaluation today patient appears to still be doing quite poorly overall she still has not gotten her antibiotics. She got her PICC line today which I assumed she will be getting the first dose of antibiotics as well during that time. With that being said unfortunately she did not get the antibiotics and in fact as I questioned her further she does not even know when or if that is going to be started this week. Again I am very concerned about this considering she already has a PICC line in place we do not want this to clot off on top of the fact that she should already be on these  antibiotics she just never went to the ER for evaluation therefore they never got this started. At this point based on what I am seeing I really think that the ideal thing would be for [place] to see about getting her to the ER for further evaluation and treatment to see if they can get this started for her as soon as possible. The patient voiced understanding and is in agreement with the plan. I gave her recommendations for what should be done also called [facility] infusion therapy in order to see if they would be the ones that seem to be available for her IV infusions but they tell me that they no longer do this that is apparently where the orders were sent to for her infusion therapy. That was by her primary care provider. 08-21-2022 upon evaluation today patient's wound on the bottom of the heel and foot area as well as the toe appear to be doing significantly better. Fortunately I do not see any evidence of infection locally or systemically and everything is measuring much smaller than where we have been. The wound in the left gluteal region also is dramatically improved compared to last time she was here. She has started the antibiotics this is cefepime and vancomycin. She did have a somewhat high trough and therefore they have her hold that today and they are to recheck in the morning I believe she told me. Nonetheless with the antibiotics going she seems to be doing significantly better which is great news. 08-28-2022 upon evaluation today patient appears to be doing somewhat better in regard to her foot there is definitely less drainage than what we have seen in the past. I do believe the antibiotics are helping. With that being said she is also been approved for hyperbaric oxygen therapy and I think as soon as we get this started the better. I discussed that with her today as well. With that being said I do think that the last thing she needs is her chest x-ray when she has that done will be ready to  start her into treatment. She is going to try to go get that today when  she leaves which I think would be ideal. Objective Constitutional Well-nourished and well-hydrated in no acute distress. Vitals Time Taken: 8:40 AM, Height: 65 in, Weight: 331 lbs, BMI: 55.1, Temperature: 99.2 F, Pulse: 92 bpm, Respiratory Rate: 20 breaths/min, Blood Pressure: 128/84 mmHg, Capillary Blood Glucose: 154 mg/dl. Respiratory normal breathing without difficulty. Psychiatric this patient is able to make decisions and demonstrates good insight into disease process. Alert and Oriented x 3. pleasant and cooperative. General Notes: Upon inspection patient's wound is showing signs of good improvement. With that being said I do not see any signs of infection systemically though locally there is some evidence of infection still present is why she is on the antibiotics and again we do know she has osteomyelitis as well. Integumentary (Hair, Skin) Wound #1 status is Open. Original cause of wound was Gradually Appeared. The date acquired was: 11/07/2020. The wound has been in treatment 11 weeks. The wound is located on the Right Calcaneus. The wound measures 9.5cm length x 2.8cm width x 0.5cm depth; 20.892cm^2 area and 10.446cm^3 volume. There is bone and Fat Layer (Subcutaneous Tissue) exposed. There is no tunneling noted, however, there is undermining starting at 10:00 and ending at 11:00 with a maximum distance of 0.4cm. There is a large amount of serosanguineous drainage noted. The wound margin is distinct with the outline attached to the wound base. There is large (67-100%) red, pink granulation within the wound bed. There is a small (1-33%) amount of necrotic tissue within the wound bed including Adherent Slough. The periwound skin appearance exhibited: Maceration, Erythema. The periwound skin appearance did not exhibit: Callus, Crepitus, Excoriation, Induration, Rash, Scarring, Dry/Scaly, Atrophie Blanche, Cyanosis,  Ecchymosis, Hemosiderin Staining, Mottled, Pallor, Rubor. The surrounding wound skin color is noted with erythema which is circumferential. Periwound temperature was noted as No Abnormality. The periwound has tenderness on palpation. Wound #2 status is Open. Original cause of wound was Gradually Appeared. The date acquired was: 07/17/2022. The wound has been in treatment 6 weeks. The wound is located on the Right T Second. The wound measures 0cm length x 0cm width x 0cm depth; 0cm^2 area and 0cm^3 volume. There is no tunneling or oe undermining noted. There is a none present amount of drainage noted. The wound margin is distinct with the outline attached to the wound base. There is no granulation within the wound bed. There is no necrotic tissue within the wound bed. The periwound skin appearance exhibited: Callus. The periwound skin appearance did not exhibit: Crepitus, Excoriation, Induration, Rash, Scarring, Dry/Scaly, Maceration, Atrophie Blanche, Cyanosis, Ecchymosis, Hemosiderin Staining, Mottled, Pallor, Rubor, Erythema. Periwound temperature was noted as No Abnormality. Wound #3 status is Open. Original cause of wound was Gradually Appeared. The date acquired was: 07/31/2022. The wound has been in treatment 3 weeks. The wound is located on the Left Gluteus. The wound measures 0cm length x 0cm width x 0cm depth; 0cm^2 area and 0cm^3 volume. There is no tunneling or undermining noted. There is a none present amount of drainage noted. The wound margin is distinct with the outline attached to the wound base. There is no granulation within the wound bed. There is no necrotic tissue within the wound bed. The periwound skin appearance did not exhibit: Callus, Crepitus, Excoriation, Induration, Rash, Scarring, Dry/Scaly, Maceration, Atrophie Blanche, Cyanosis, Ecchymosis, Hemosiderin Staining, Mottled, Pallor, Rubor, Erythema. Assessment Active Problems ICD-10 Other chronic osteomyelitis, right  ankle and foot Type 2 diabetes mellitus with foot ulcer Non-pressure chronic ulcer of right heel and midfoot with fat  layer exposed Lymphedema, not elsewhere classified Bolyard, Aijalon ([id number]) [id number] Page 8 of 9 Plan Follow-up Appointments: Return Appointment in 1 week. - Wednesday w/ Allen Derry Other: - Ensure you continue oral antibiotics until PICC line place, then start and finish all IV antibiotics- Vancomycin and Zosyn (ordered primary care provider). Approved for HBO-pt. to start Monday. Chest x-ray needed- pt. to get done on Friday 11/25 Anesthetic: (In clinic) Topical Lidocaine 5% applied to wound bed Bathing/ Shower/ Hygiene: May shower with protection but do not get wound dressing(s) wet. Edema Control - Lymphedema / SCD / Other: Elevate legs to the level of the heart or above for 30 minutes daily and/or when sitting, a frequency of: - 3-4 times a day throughout the day. Avoid standing for long periods of time. Off-Loading: Open toe surgical shoe to: - w/ peg assist Other: - Minimize walking and standing as much as possible to right foot to aid in offloading to wound. While at work use the wheelchair for mobility. Hyperbaric Oxygen Therapy: Wound #1 Right Calcaneus: Evaluate for HBO Therapy Indication: - Wagner Grade 3 to right calcaneus If appropriate for treatment, begin HBOT per protocol: 2.5 ATA for 90 Minutes with 2 Five (5) Minute Air Breaks T Number of Treatments: - 40 otal One treatments per day (delivered Monday through Friday unless otherwise specified in Special Instructions below): Finger stick Blood Glucose Pre- and Post- HBOT Treatment. Follow Hyperbaric Oxygen Glycemia Protocol Afrin (Oxymetazoline HCL) 0.05% nasal spray - 1 spray in both nostrils daily as needed prior to HBO treatment for difficulty clearing ears ordered were: Screening EKG - Patient needs a screening EKG prior to starting HBO therapy WOUND #1: - Calcaneus  Wound Laterality: Right Cleanser: Soap and Water 1 x Per Day/15 Days Discharge Instructions: May shower and wash wound with dial antibacterial soap and water prior to dressing change. Cleanser: Wound Cleanser (Generic) 1 x Per Day/15 Days Discharge Instructions: Cleanse the wound with wound cleanser prior to applying a clean dressing using gauze sponges, not tissue or cotton balls. Peri-Wound Care: Triamcinolone 15 (g) 1 x Per Day/15 Days Discharge Instructions: Use triamcinolone 15 (g) as directed Peri-Wound Care: Zinc Oxide Ointment 30g tube 1 x Per Day/15 Days Discharge Instructions: Apply Zinc Oxide to periwound with each dressing change Peri-Wound Care: Sween Lotion (Moisturizing lotion) 1 x Per Day/15 Days Discharge Instructions: Apply moisturizing lotion as directed Prim Dressing: KerraCel Ag Gelling Fiber Dressing, 4x5 in (silver alginate) (Generic) 1 x Per Day/15 Days ary Discharge Instructions: Apply silver alginate to wound bed as instructed Secondary Dressing: ABD Pad, 5x9 (Generic) 1 x Per Day/15 Days Discharge Instructions: Apply over primary dressing as directed. Secondary Dressing: Drawtex 4x4 in (Generic) 1 x Per Day/15 Days Discharge Instructions: Apply over primary dressing as directed. Secondary Dressing: Woven Gauze Sponge, Non-Sterile 4x4 in (Generic) 1 x Per Day/15 Days Discharge Instructions: Apply over primary dressing as directed. Secured With: Elastic Bandage 4 inch (ACE bandage) (Generic) 1 x Per Day/15 Days Discharge Instructions: Secure with ACE bandage as directed. Secured With: [employer], 4.5x3.1 (in/yd) (Generic) 1 x Per Day/15 Days Discharge Instructions: Secure with Kerlix as directed. Secured With: 31M Medipore H Soft Cloth Surgical T ape, 4 x 10 (in/yd) (Generic) 1 x Per Day/15 Days Discharge Instructions: Secure with tape as directed. 1. We are going to go ahead and continue with the wound care measures as before patient is in agreement with  plan this includes the use of the silver alginate dressing followed by  ABD pad and drawtex to help with drainage control. She seems to be doing very well in that regard. 2. Also can recommend that we have the patient continue with the recommendation for her offloading she is trying to stay off of this is much as possible and I think that is helping. 3. Also she should be starting hyperbaric oxygen therapy hopefully on Monday we just need to get her chest x-ray she tells me she is good to go for that this afternoon hopefully so we can have that done and get her scheduled. We will see patient back for reevaluation in 1 week here in the clinic. If anything worsens or changes patient will contact our office for additional recommendations. Electronic Signature(s) Signed: 08/28/2022 2:29:51 PM By: Lenda Kelp PA-C Previous Signature: 08/28/2022 10:01:26 AM Version By: Lenda Kelp PA-C Entered By: Lenda Kelp on 08/28/2022 14:29:51 Hardt, Deidrea ([id number]) [id number] Page 9 of 9 -------------------------------------------------------------------------------- SuperBill Details Patient Name: Date of Service: [name], [name] 08/28/2022 Medical Record Number: [id number] Patient Account Number: [id number] Date of Birth/Sex: Treating RN: May 27, 1966 (56 y.o. [name], [name] Primary Care Provider: [name] Other Clinician: Referring Provider: Treating Provider/Extender: [name] in Treatment: 11 Diagnosis Coding ICD-10 Codes Code Description [phone] Other chronic osteomyelitis, right ankle and foot E11.621 Type 2 diabetes mellitus with foot ulcer L97.412 Non-pressure chronic ulcer of right heel and midfoot with fat layer exposed I89.0 Lymphedema, not elsewhere classified Facility Procedures : CPT4 Code: [id number] Description: 84696 - WOUND CARE VISIT-LEV 5 EST PT Modifier: Quantity: 1 Physician Procedures : CPT4 Code  Description Modifier [id number] - WC PHYS LEVEL 3 - EST PT ICD-10 Diagnosis Description M86.671 Other chronic osteomyelitis, right ankle and foot E11.621 Type 2 diabetes mellitus with foot ulcer L97.412 Non-pressure chronic ulcer of right  heel and midfoot with fat layer exposed I89.0 Lymphedema, not elsewhere classified Quantity: 1 Electronic Signature(s) Signed: 08/28/2022 2:31:05 PM By: Lenda Kelp PA-C Previous Signature: 08/28/2022 10:01:35 AM Version By: Lenda Kelp PA-C Entered By: Lenda Kelp on 08/28/2022 14:31:05

## 2022-09-03 DIAGNOSIS — E11621 Type 2 diabetes mellitus with foot ulcer: Secondary | ICD-10-CM | POA: Diagnosis not present

## 2022-09-04 ENCOUNTER — Encounter (HOSPITAL_BASED_OUTPATIENT_CLINIC_OR_DEPARTMENT_OTHER): Payer: No Typology Code available for payment source | Admitting: Physician Assistant

## 2022-09-11 ENCOUNTER — Encounter (HOSPITAL_BASED_OUTPATIENT_CLINIC_OR_DEPARTMENT_OTHER): Payer: No Typology Code available for payment source | Attending: Physician Assistant | Admitting: Physician Assistant

## 2022-09-11 ENCOUNTER — Telehealth: Payer: Self-pay | Admitting: *Deleted

## 2022-09-11 DIAGNOSIS — I890 Lymphedema, not elsewhere classified: Secondary | ICD-10-CM | POA: Diagnosis not present

## 2022-09-11 DIAGNOSIS — Z89429 Acquired absence of other toe(s), unspecified side: Secondary | ICD-10-CM | POA: Insufficient documentation

## 2022-09-11 DIAGNOSIS — I10 Essential (primary) hypertension: Secondary | ICD-10-CM | POA: Insufficient documentation

## 2022-09-11 DIAGNOSIS — L97412 Non-pressure chronic ulcer of right heel and midfoot with fat layer exposed: Secondary | ICD-10-CM | POA: Diagnosis not present

## 2022-09-11 DIAGNOSIS — E11621 Type 2 diabetes mellitus with foot ulcer: Secondary | ICD-10-CM | POA: Diagnosis present

## 2022-09-11 DIAGNOSIS — E1151 Type 2 diabetes mellitus with diabetic peripheral angiopathy without gangrene: Secondary | ICD-10-CM | POA: Diagnosis not present

## 2022-09-11 DIAGNOSIS — M86671 Other chronic osteomyelitis, right ankle and foot: Secondary | ICD-10-CM | POA: Insufficient documentation

## 2022-09-11 NOTE — Progress Notes (Signed)
  Care Coordination Note  09/11/2022 Name: [name] MRN: [id number] DOB: 16-Jul-1966  [name] is a 56 y.o. year old female who is a primary care patient of Hasanaj, Myra Gianotti, MD and is actively engaged with the care management team. I reached out to Darral Dash by phone today to assist with re-scheduling a follow up visit with the RN Case Manager  Follow up plan: Unsuccessful telephone outreach attempt made. A HIPAA compliant phone message was left for the patient providing contact information and requesting a return call.   [facility]  Care Coordination Care Guide  Direct Dial: [phone]

## 2022-09-11 NOTE — Progress Notes (Addendum)
Fulgham, [name] AspINDY ([id number]) [id number] Page 1 of 8 Visit Report for 09/11/2022 Chief Complaint Document Details Patient Name: Date of Service: [name], [name] 09/11/2022 9:30 A M Medical Record Number: [id number] Patient Account Number: [id number] Date of Birth/Sex: Treating RN: 11/24/1965 (56 y.o. F) Primary Care Provider: Lia HoppingHasanaj, Xaje Other Clinician: Referring Provider: Treating Provider/Extender: Laurann MontanaStone III, Asli Tokarski Hasanaj, Xaje Weeks in Treatment: 13 Information Obtained from: Patient Chief Complaint Right heel ulcer Electronic Signature(s) Signed: 09/11/2022 9:46:00 AM By: Lenda KelpStone III, Andreal Vultaggio PA-C Entered By: Lenda KelpStone III, Alexzia Kasler on 09/11/2022 09:46:00 -------------------------------------------------------------------------------- HPI Details Patient Name: Date of Service: [name], [name] 09/11/2022 9:30 A M Medical Record Number: [id number] Patient Account Number: [id number] Date of Birth/Sex: Treating RN: 04/03/1966 (56 y.o. F) Primary Care Provider: Lia HoppingHasanaj, Xaje Other Clinician: Referring Provider: Treating Provider/Extender: Laurann MontanaStone III, Koua Deeg Hasanaj, Xaje Weeks in Treatment: 13 History of Present Illness HPI Description: 06-13-2022 upon evaluation today patient presents for evaluation of her right heel ulcer. She is having a tremendous amount of pain at this location. She tells me that her most recent hemoglobin A1c was 8.7 and that was on 08-23-2021. This ulcer on the heel has been present she states since February 2023 when she had an injection to the heel by podiatry. She states that she began to have increasing pain the wound opened and has been open ever since. 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 She had an x-ray in June if anything can although there did not appear to be any obvious evidence of osteomyelitis at that point. She subsequently has had OR debridement several times of the wound performed by podiatry. Patient does have a  history of lymphedema of the lower extremities she is also a type II diabetic. 06-19-2022 upon evaluation today patient appears to be doing better in regard to the size of her leg which is significantly improved. With that being said she had a lot of drainage from the heel which is completely understandable considering what is going on at this time. There does not appear to be any evidence of active infection there is no warmth or irritation to the leg in general. With that being said the patient does have an issue here with the amount of drainage that is going on I definitely think Zetuvit is good to be the better way to go in place of ABD pads. Also think that she could potentially benefit from 3 times per week dressing changes but again right now we are going to stick with the to change it today, change in her Friday, and then subsequently seeing where things stand next Wednesday. The other option would be to change her to a different day for wound care having her come then say like a Tuesday Friday or Monday Thursday. 06-26-2022 upon evaluation patient's wound bed actually showed signs of doing well in regard to the overall size it was measuring slightly smaller. With that being said unfortunately the biggest issue we see is she still has a tremendous amount of drainage which is what could keep this from being able to use a total contact cast. For that reason I am did going to discuss with her today the possibility of trying Tubigrip to see if that could be of benefit for her. She voiced understanding and is in agreement with giving this a try. 07-03-2022 upon evaluation today patient unfortunately appears to be doing significantly worse at this point in regard to her swelling due to the fact that she was unable to really  wear the Tubigrip. She tells me that it was cutting into her. With that being said she also did have an MRI this MRI revealed that she does have osteomyelitis in the heel this was  actually just performed Monday, 25 September. This does show signs of "early osteomyelitis". Nonetheless this is still concerning to me. The wound also appears to be getting deeper in the center aspect of this which has me a little concerned as well. I do believe that she is likely going require an aggressive approach here to try to get things better. I discussed that in greater detail with her today. I will detailed in the plan. 07-17-2022 upon evaluation today patient appears to be doing poorly in regard to her wound. Unfortunately she does not show any signs of infection systemically though locally this seems to be doing worse with the wound actually being bigger than where it was previous. She did have an MRI that showed evidence of osteomyelitis actually recommended a referral to ID she was evaluated infectious disease and they recommended referral to podiatry and to be honest have recommended amputation based on what the patient tells me today. With that being said this is something that she is not interested in at all to be Teichert, Monti ([id number]) [id number] Page 2 of 8 perfectly honest. For that reason she wants to know what she should do and where she should go at this point that is where the majority of the conversation which was quite lengthy today went. Obviously understand her concern here but I think she is going to have to definitely get off this and likely this means she is going to need to come out of work. 10/18; Since the patient was the patient is using Hydrofera Blue on her right heel wound. She has a new larger open area superior to the wound. The skin on the bottom of her foot is completely macerated. I reviewed the infectious disease note on this patient from 10/9. They recommended keeping the patient on Delofloxacin 450 twice daily until follow-up tomorrow. The patient states she is not going back there as the only thing they wanted to do was "cut  my leg off]. She has not seen podiatry. Her lab works shows a CRP markedly elevated at 240.2 a sedimentation rate of 96 the rest of her CBC is normal creatinine of 0.38 The patient has been to see her primary doctor who is Dr. Leonides Schanz [[facility]] In [place]. According the patient Dr. Loralee Pacas has ordered vancomycin and Zosyn to start on November 1. She is not taking the delofloxacin that was suggested by infectious disease. Very angry that they just wanted to consider her for an amputation although I do not specifically see that stated in their note 07-31-2022 upon evaluation today patient still has a significant wound over her plantar aspect of her right l foot region. With that being said I am definitely concerned about the fact that the patient probably does need to be in hyperbaric oxygen therapy at this point. I think we should try to get this going as quickly as possible. She actually is going to be set up with a PICC line next Wednesday and then following this we will have the vancomycin and Zosyn that she will be taking for 8 weeks according to what she tells me. I think this is definitely going to be beneficial and again the goal here is limb salvage. In combination with that I think the hyperbaric oxygen therapy would be ultimately significantly  helpful for her. 08-07-2022 upon evaluation today patient appears to still be doing quite poorly overall she still has not gotten her antibiotics. She got her PICC line today which I assumed she will be getting the first dose of antibiotics as well during that time. With that being said unfortunately she did not get the antibiotics and in fact as I questioned her further she does not even know when or if that is going to be started this week. Again I am very concerned about this considering she already has a PICC line in place we do not want this to clot off on top of the fact that she should already be on these antibiotics she just never went to the ER  for evaluation therefore they never got this started. At this point based on what I am seeing I really think that the ideal thing would be for [place] to see about getting her to the ER for further evaluation and treatment to see if they can get this started for her as soon as possible. The patient voiced understanding and is in agreement with the plan. I gave her recommendations for what should be done also called [facility] infusion therapy in order to see if they would be the ones that seem to be available for her IV infusions but they tell me that they no longer do this that is apparently where the orders were sent to for her infusion therapy. That was by her primary care provider. 08-21-2022 upon evaluation today patient's wound on the bottom of the heel and foot area as well as the toe appear to be doing significantly better. Fortunately I do not see any evidence of infection locally or systemically and everything is measuring much smaller than where we have been. The wound in the left gluteal region also is dramatically improved compared to last time she was here. She has started the antibiotics this is cefepime and vancomycin. She did have a somewhat high trough and therefore they have her hold that today and they are to recheck in the morning I believe she told me. Nonetheless with the antibiotics going she seems to be doing significantly better which is great news. 08-28-2022 upon evaluation today patient appears to be doing somewhat better in regard to her foot there is definitely less drainage than what we have seen in the past. I do believe the antibiotics are helping. With that being said she is also been approved for hyperbaric oxygen therapy and I think as soon as we get this started the better. I discussed that with her today as well. With that being said I do think that the last thing she needs is her chest x-ray when she has that done will be ready to start her into treatment. She is going to  try to go get that today when she leaves which I think would be ideal. 09-11-2022 upon evaluation today patient appears to be doing well with regard to her foot ulcer. This is showing signs of no digression and she does not appear to be having as much drainage as before though it still very wet is not nearly the extreme of what it was previous. Fortunately there does not appear to be any signs of active infection locally nor systemically at this point which is great news. Patient did have her chest x-ray as well as EKG and she is actually approved and completely cleared for HBO therapy from both a clinical standpoint as well as an insurance standpoint.  Electronic Signature(s) Signed: 09/11/2022 10:39:34 AM By: Lenda Kelp PA-C Entered By: Lenda Kelp on 09/11/2022 10:39:34 -------------------------------------------------------------------------------- Physical Exam Details Patient Name: Date of Service: [name], [name] 09/11/2022 9:30 A M Medical Record Number: [id number] Patient Account Number: [id number] Date of Birth/Sex: Treating RN: [birth year] (56 y.o. F) Primary Care Provider: [name] Other Clinician: Referring Provider: Treating Provider/Extender: [name] in Treatment: 13 Constitutional Well-nourished and well-hydrated in no acute distress. Respiratory normal breathing without difficulty. Psychiatric this patient is able to make decisions and demonstrates good insight into disease process. Alert and Oriented x 3. pleasant and cooperative. Notes Upon inspection patient's wound bed actually showed signs of good granulation epithelization at this point. Fortunately there does not appear to be any evidence of active infection locally nor systemically at this time which is great news and overall I am extremely pleased with where things stand today. Burgen, [name] ([id number]) [id number] Page 3 of 8 Electronic  Signature(s) Signed: 09/11/2022 10:41:13 AM By: Lenda Kelp PA-C Entered By: Lenda Kelp on 09/11/2022 10:41:12 -------------------------------------------------------------------------------- Physician Orders Details Patient Name: Date of Service: [name], [name] 09/11/2022 9:30 A M Medical Record Number: [id number] Patient Account Number: [id number] Date of Birth/Sex: Treating RN: 06-15-1966 (56 y.o. [name], [name] Primary Care Provider: [name] Other Clinician: Referring Provider: Treating Provider/Extender: [name] in Treatment: 55 Verbal / Phone Orders: No Diagnosis Coding ICD-10 Coding Code Description [phone] Other chronic osteomyelitis, right ankle and foot E11.621 Type 2 diabetes mellitus with foot ulcer L97.412 Non-pressure chronic ulcer of right heel and midfoot with fat layer exposed I89.0 Lymphedema, not elsewhere classified Follow-up Appointments ppointment in 2 weeks. - w/ Allen Derry, PA on WEdnesday's Return A Other: - Ensure you continue oral antibiotics until PICC line place, then start and finish all IV antibiotics- Vancomycin and Zosyn (ordered primary care provider). Approved for HBO-pt. to start tomorrow @ 1200. Chest x-ray and EKG checked off by [facility]. Anesthetic (In clinic) Topical Lidocaine 5% applied to wound bed Bathing/ Shower/ Hygiene May shower with protection but do not get wound dressing(s) wet. Edema Control - Lymphedema / SCD / Other Elevate legs to the level of the heart or above for 30 minutes daily and/or when sitting, a frequency of: - 3-4 times a day throughout the day. Avoid standing for long periods of time. Off-Loading Open toe surgical shoe to: - w/ peg assist Other: - Minimize walking and standing as much as possible to right foot to aid in offloading to wound. While at work use the wheelchair for mobility. Hyperbaric Oxygen Therapy Wound #1 Right Calcaneus Evaluate for HBO  Therapy Indication: - Wagner Grade 3 to right calcaneus If appropriate for treatment, begin HBOT per protocol: 2.5 ATA for 90 Minutes with 2 Five (5) Minute A Breaks ir Total Number of Treatments: - 40 One treatments per day (delivered Monday through Friday unless otherwise specified in Special Instructions below): Finger stick Blood Glucose Pre- and Post- HBOT Treatment. Follow Hyperbaric Oxygen Glycemia Protocol A frin (Oxymetazoline HCL) 0.05% nasal spray - 1 spray in both nostrils daily as needed prior to HBO treatment for difficulty clearing ears Wound Treatment Wound #1 - Calcaneus Wound Laterality: Right Cleanser: Soap and Water 1 x Per Day/15 Days Discharge Instructions: May shower and wash wound with dial antibacterial soap and water prior to dressing change. Cleanser: Wound Cleanser (Generic) 1 x Per Day/15 Days Discharge Instructions: Cleanse the wound with wound cleanser prior to applying a clean  dressing using gauze sponges, not tissue or cotton balls. Peri-Wound Care: Triamcinolone 15 (g) 1 x Per Day/15 Days Discharge Instructions: Use triamcinolone 15 (g) as directed Peri-Wound Care: Zinc Oxide Ointment 30g tube 1 x Per Day/15 Days Discharge Instructions: Apply Zinc Oxide to periwound with each dressing change [name], [name] ([id number]) [id number] Page 4 of 8 Peri-Wound Care: Sween Lotion (Moisturizing lotion) 1 x Per Day/15 Days Discharge Instructions: Apply moisturizing lotion as directed Prim Dressing: KerraCel Ag Gelling Fiber Dressing, 4x5 in (silver alginate) (Generic) 1 x Per Day/15 Days ary Discharge Instructions: Apply silver alginate to wound bed as instructed Secondary Dressing: ABD Pad, 5x9 (Generic) 1 x Per Day/15 Days Discharge Instructions: Apply over primary dressing as directed. Secondary Dressing: Drawtex 4x4 in (Generic) 1 x Per Day/15 Days Discharge Instructions: Apply over primary dressing as directed. Secondary Dressing:  Woven Gauze Sponge, Non-Sterile 4x4 in (Generic) 1 x Per Day/15 Days Discharge Instructions: Apply over primary dressing as directed. Secured With: Elastic Bandage 4 inch (ACE bandage) (Generic) 1 x Per Day/15 Days Discharge Instructions: Secure with ACE bandage as directed. Secured With: [employer], 4.5x3.1 (in/yd) (Generic) 1 x Per Day/15 Days Discharge Instructions: Secure with Kerlix as directed. Secured With: 57M Medipore H Soft Cloth Surgical T ape, 4 x 10 (in/yd) (Generic) 1 x Per Day/15 Days Discharge Instructions: Secure with tape as directed. GLYCEMIA INTERVENTIONS PROTOCOL PRE-HBO GLYCEMIA INTERVENTIONS ACTION INTERVENTION Obtain pre-HBO capillary blood glucose (ensure 1 physician order is in chart). A. Notify HBO physician and await physician orders. 2 If result is 70 mg/dl or below: B. If the result meets the hospital definition of a critical result, follow hospital policy. A. Give patient an 8 ounce Glucerna Shake, an 8 ounce Ensure, or 8 ounces of a Glucerna/Ensure equivalent dietary supplement*. B. Wait 30 minutes. If result is 71 mg/dl to 409 mg/dl: C. Retest patients capillary blood glucose (CBG). D. If result greater than or equal to 110 mg/dl, proceed with HBO. If result less than 110 mg/dl, notify HBO physician and consider holding HBO. If result is 131 mg/dl to 811 mg/dl: A. Proceed with HBO. A. Notify HBO physician and await physician orders. B. It is recommended to hold HBO and do If result is 250 mg/dl or greater: blood/urine ketone testing. C. If the result meets the hospital definition of a critical result, follow hospital policy. POST-HBO GLYCEMIA INTERVENTIONS ACTION INTERVENTION Obtain post HBO capillary blood glucose (ensure 1 physician order is in chart). A. Notify HBO physician and await physician orders. 2 If result is 70 mg/dl or below: B. If the result meets the hospital definition of a critical result, follow hospital  policy. A. Give patient an 8 ounce Glucerna Shake, an 8 ounce Ensure, or 8 ounces of a Glucerna/Ensure equivalent dietary supplement*. B. Wait 15 minutes for symptoms of If result is 71 mg/dl to 914 mg/dl: hypoglycemia (i.e. nervousness, anxiety, sweating, chills, clamminess, irritability, confusion, tachycardia or dizziness). C. If patient asymptomatic, discharge patient. If patient symptomatic, repeat capillary blood glucose (CBG) and notify HBO physician. If result is 101 mg/dl to 782 mg/dl: A. Discharge patient. A. Notify HBO physician and await physician orders. B. It is recommended to do blood/urine ketone If result is 250 mg/dl or greater: testing. C. If the result meets the hospital definition of a critical result, follow hospital policy. *Juice or candies are NOT equivalent products. If patient refuses the Glucerna or Ensure, please consult the hospital dietitian for an appropriate substitute. Electronic Signature(s) [name], [name] ([id number]) [id number]  Page 5 of 8 Signed: 09/11/2022 4:11:00 PM By: Lenda Kelp PA-C Signed: 10/09/2022 5:35:40 PM By: Fonnie Mu RN Entered By: Fonnie Mu on 09/11/2022 10:12:19 -------------------------------------------------------------------------------- Problem List Details Patient Name: Date of Service: [name], [name] 09/11/2022 9:30 A M Medical Record Number: [id number] Patient Account Number: [id number] Date of Birth/Sex: Treating RN: 1965/11/04 (56 y.o. F) Primary Care Provider: [name] Other Clinician: Referring Provider: Treating Provider/Extender: [name] in Treatment: 13 Active Problems ICD-10 Encounter Code Description Active Date MDM Diagnosis [phone] Other chronic osteomyelitis, right ankle and foot 08/28/2022 No Yes E11.621 Type 2 diabetes mellitus with foot ulcer 06/12/2022 No Yes L97.412 Non-pressure chronic ulcer of right heel and midfoot  with fat layer exposed 06/12/2022 No Yes I89.0 Lymphedema, not elsewhere classified 06/12/2022 No Yes Inactive Problems Resolved Problems Electronic Signature(s) Signed: 09/11/2022 9:45:53 AM By: Lenda Kelp PA-C Entered By: Lenda Kelp on 09/11/2022 09:45:53 -------------------------------------------------------------------------------- Progress Note Details Patient Name: Date of Service: [name], [name] 09/11/2022 9:30 A M Medical Record Number: [id number] Patient Account Number: [id number] Date of Birth/Sex: Treating RN: 10-27-1965 (56 y.o. F) Primary Care Provider: [name] Other Clinician: Referring Provider: Treating Provider/Extender: [name] in Treatment: 13 Subjective Chief Complaint Information obtained from Patient Right heel ulcer Moisan, Verlean ([id number]) [id number] Page 6 of 8 History of Present Illness (HPI) 06-13-2022 upon evaluation today patient presents for evaluation of her right heel ulcer. She is having a tremendous amount of pain at this location. She tells me that her most recent hemoglobin A1c was 8.7 and that was on 08-23-2021. This ulcer on the heel has been present she states since February 2023 when she had an injection to the heel by podiatry. She states that she began to have increasing pain the wound opened and has been open ever since. 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 She had an x-ray in June if anything can although there did not appear to be any obvious evidence of osteomyelitis at that point. She subsequently has had OR debridement several times of the wound performed by podiatry. Patient does have a history of lymphedema of the lower extremities she is also a type II diabetic. 06-19-2022 upon evaluation today patient appears to be doing better in regard to the size of her leg which is significantly improved. With that being said she had a lot of drainage  from the heel which is completely understandable considering what is going on at this time. There does not appear to be any evidence of active infection there is no warmth or irritation to the leg in general. With that being said the patient does have an issue here with the amount of drainage that is going on I definitely think Zetuvit is good to be the better way to go in place of ABD pads. Also think that she could potentially benefit from 3 times per week dressing changes but again right now we are going to stick with the to change it today, change in her Friday, and then subsequently seeing where things stand next Wednesday. The other option would be to change her to a different day for wound care having her come then say like a Tuesday Friday or Monday Thursday. 06-26-2022 upon evaluation patient's wound bed actually showed signs of doing well in regard to the overall size it was measuring slightly smaller. With that being said unfortunately the biggest issue we see is she still has a tremendous amount  of drainage which is what could keep this from being able to use a total contact cast. For that reason I am did going to discuss with her today the possibility of trying Tubigrip to see if that could be of benefit for her. She voiced understanding and is in agreement with giving this a try. 07-03-2022 upon evaluation today patient unfortunately appears to be doing significantly worse at this point in regard to her swelling due to the fact that she was unable to really wear the Tubigrip. She tells me that it was cutting into her. With that being said she also did have an MRI this MRI revealed that she does have osteomyelitis in the heel this was actually just performed Monday, 25 September. This does show signs of "early osteomyelitis". Nonetheless this is still concerning to me. The wound also appears to be getting deeper in the center aspect of this which has me a little concerned as well. I do  believe that she is likely going require an aggressive approach here to try to get things better. I discussed that in greater detail with her today. I will detailed in the plan. 07-17-2022 upon evaluation today patient appears to be doing poorly in regard to her wound. Unfortunately she does not show any signs of infection systemically though locally this seems to be doing worse with the wound actually being bigger than where it was previous. She did have an MRI that showed evidence of osteomyelitis actually recommended a referral to ID she was evaluated infectious disease and they recommended referral to podiatry and to be honest have recommended amputation based on what the patient tells me today. With that being said this is something that she is not interested in at all to be perfectly honest. For that reason she wants to know what she should do and where she should go at this point that is where the majority of the conversation which was quite lengthy today went. Obviously understand her concern here but I think she is going to have to definitely get off this and likely this means she is going to need to come out of work. 10/18; Since the patient was the patient is using Hydrofera Blue on her right heel wound. She has a new larger open area superior to the wound. The skin on the bottom of her foot is completely macerated. I reviewed the infectious disease note on this patient from 10/9. They recommended keeping the patient on Delofloxacin 450 twice daily until follow-up tomorrow. The patient states she is not going back there as the only thing they wanted to do was "cut my leg off]. She has not seen podiatry. Her lab works shows a CRP markedly elevated at 240.2 a sedimentation rate of 96 the rest of her CBC is normal creatinine of 0.38 The patient has been to see her primary doctor who is Dr. Leonides Schanz [SPo] In [place]. According the patient Dr. Loralee Pacas has ordered vancomycin and Zosyn to start on  November 1. She is not taking the delofloxacin that was suggested by infectious disease. Very angry that they just wanted to consider her for an amputation although I do not specifically see that stated in their note 07-31-2022 upon evaluation today patient still has a significant wound over her plantar aspect of her right l foot region. With that being said I am definitely concerned about the fact that the patient probably does need to be in hyperbaric oxygen therapy at this point. I think we should try  to get this going as quickly as possible. She actually is going to be set up with a PICC line next Wednesday and then following this we will have the vancomycin and Zosyn that she will be taking for 8 weeks according to what she tells me. I think this is definitely going to be beneficial and again the goal here is limb salvage. In combination with that I think the hyperbaric oxygen therapy would be ultimately significantly helpful for her. 08-07-2022 upon evaluation today patient appears to still be doing quite poorly overall she still has not gotten her antibiotics. She got her PICC line today which I assumed she will be getting the first dose of antibiotics as well during that time. With that being said unfortunately she did not get the antibiotics and in fact as I questioned her further she does not even know when or if that is going to be started this week. Again I am very concerned about this considering she already has a PICC line in place we do not want this to clot off on top of the fact that she should already be on these antibiotics she just never went to the ER for evaluation therefore they never got this started. At this point based on what I am seeing I really think that the ideal thing would be for [place] to see about getting her to the ER for further evaluation and treatment to see if they can get this started for her as soon as possible. The patient voiced understanding and is in agreement  with the plan. I gave her recommendations for what should be done also called [facility] infusion therapy in order to see if they would be the ones that seem to be available for her IV infusions but they tell me that they no longer do this that is apparently where the orders were sent to for her infusion therapy. That was by her primary care provider. 08-21-2022 upon evaluation today patient's wound on the bottom of the heel and foot area as well as the toe appear to be doing significantly better. Fortunately I do not see any evidence of infection locally or systemically and everything is measuring much smaller than where we have been. The wound in the left gluteal region also is dramatically improved compared to last time she was here. She has started the antibiotics this is cefepime and vancomycin. She did have a somewhat high trough and therefore they have her hold that today and they are to recheck in the morning I believe she told me. Nonetheless with the antibiotics going she seems to be doing significantly better which is great news. 08-28-2022 upon evaluation today patient appears to be doing somewhat better in regard to her foot there is definitely less drainage than what we have seen in the past. I do believe the antibiotics are helping. With that being said she is also been approved for hyperbaric oxygen therapy and I think as soon as we get this started the better. I discussed that with her today as well. With that being said I do think that the last thing she needs is her chest x-ray when she has that done will be ready to start her into treatment. She is going to try to go get that today when she leaves which I think would be ideal. 09-11-2022 upon evaluation today patient appears to be doing well with regard to her foot ulcer. This is showing signs of no digression and she does not appear  to be having as much drainage as before though it still very wet is not nearly the extreme of what it was  previous. Fortunately there does not appear to be any signs of active infection locally nor systemically at this point which is great news. Patient did have her chest x-ray as well as EKG and she is actually approved and completely cleared for HBO therapy from both a clinical standpoint as well as an insurance standpoint. Objective [name], [name] ([id number]) [id number] Page 7 of 8 Constitutional Well-nourished and well-hydrated in no acute distress. Vitals Time Taken: 9:43 AM, Height: 65 in, Weight: 331 lbs, BMI: 55.1, Temperature: 98.8 F, Pulse: 81 bpm, Respiratory Rate: 18 breaths/min, Blood Pressure: 129/84 mmHg, Capillary Blood Glucose: 163 mg/dl. Respiratory normal breathing without difficulty. Psychiatric this patient is able to make decisions and demonstrates good insight into disease process. Alert and Oriented x 3. pleasant and cooperative. General Notes: Upon inspection patient's wound bed actually showed signs of good granulation epithelization at this point. Fortunately there does not appear to be any evidence of active infection locally nor systemically at this time which is great news and overall I am extremely pleased with where things stand today. Integumentary (Hair, Skin) Wound #1 status is Open. Original cause of wound was Gradually Appeared. The date acquired was: 11/07/2020. The wound has been in treatment 13 weeks. The wound is located on the Right Calcaneus. The wound measures 9cm length x 2.8cm width x 0.5cm depth; 19.792cm^2 area and 9.896cm^3 volume. There is bone and Fat Layer (Subcutaneous Tissue) exposed. There is no tunneling or undermining noted. There is a large amount of serosanguineous drainage noted. The wound margin is distinct with the outline attached to the wound base. There is large (67-100%) red, pink granulation within the wound bed. There is a small (1-33%) amount of necrotic tissue within the wound bed including Adherent  Slough. The periwound skin appearance exhibited: Erythema. The periwound skin appearance did not exhibit: Callus, Crepitus, Excoriation, Induration, Rash, Scarring, Dry/Scaly, Maceration, Atrophie Blanche, Cyanosis, Ecchymosis, Hemosiderin Staining, Mottled, Pallor, Rubor. The surrounding wound skin color is noted with erythema which is circumferential. Periwound temperature was noted as No Abnormality. The periwound has tenderness on palpation. Assessment Active Problems ICD-10 Other chronic osteomyelitis, right ankle and foot Type 2 diabetes mellitus with foot ulcer Non-pressure chronic ulcer of right heel and midfoot with fat layer exposed Lymphedema, not elsewhere classified Plan Follow-up Appointments: Return Appointment in 2 weeks. - w/ Allen Derry, PA on WEdnesday's Other: - Ensure you continue oral antibiotics until PICC line place, then start and finish all IV antibiotics- Vancomycin and Zosyn (ordered primary care provider). Approved for HBO-pt. to start tomorrow @ 1200. Chest x-ray and EKG checked off by [facility]. Anesthetic: (In clinic) Topical Lidocaine 5% applied to wound bed Bathing/ Shower/ Hygiene: May shower with protection but do not get wound dressing(s) wet. Edema Control - Lymphedema / SCD / Other: Elevate legs to the level of the heart or above for 30 minutes daily and/or when sitting, a frequency of: - 3-4 times a day throughout the day. Avoid standing for long periods of time. Off-Loading: Open toe surgical shoe to: - w/ peg assist Other: - Minimize walking and standing as much as possible to right foot to aid in offloading to wound. While at work use the wheelchair for mobility. Hyperbaric Oxygen Therapy: Wound #1 Right Calcaneus: Evaluate for HBO Therapy Indication: - Wagner Grade 3 to right calcaneus If appropriate for treatment, begin HBOT per protocol: 2.5  ATA for 90 Minutes with 2 Five (5) Minute Air Breaks T Number of Treatments: - 40 otal One treatments  per day (delivered Monday through Friday unless otherwise specified in Special Instructions below): Finger stick Blood Glucose Pre- and Post- HBOT Treatment. Follow Hyperbaric Oxygen Glycemia Protocol Afrin (Oxymetazoline HCL) 0.05% nasal spray - 1 spray in both nostrils daily as needed prior to HBO treatment for difficulty clearing ears WOUND #1: - Calcaneus Wound Laterality: Right Cleanser: Soap and Water 1 x Per Day/15 Days Discharge Instructions: May shower and wash wound with dial antibacterial soap and water prior to dressing change. Cleanser: Wound Cleanser (Generic) 1 x Per Day/15 Days Discharge Instructions: Cleanse the wound with wound cleanser prior to applying a clean dressing using gauze sponges, not tissue or cotton balls. Peri-Wound Care: Triamcinolone 15 (g) 1 x Per Day/15 Days Discharge Instructions: Use triamcinolone 15 (g) as directed Peri-Wound Care: Zinc Oxide Ointment 30g tube 1 x Per Day/15 Days Discharge Instructions: Apply Zinc Oxide to periwound with each dressing change Peri-Wound Care: Sween Lotion (Moisturizing lotion) 1 x Per Day/15 Days Discharge Instructions: Apply moisturizing lotion as directed Prim Dressing: KerraCel Ag Gelling Fiber Dressing, 4x5 in (silver alginate) (Generic) 1 x Per Day/15 Days ary Discharge Instructions: Apply silver alginate to wound bed as instructed Secondary Dressing: ABD Pad, 5x9 (Generic) 1 x Per Day/15 Days Discharge Instructions: Apply over primary dressing as directed. Secondary Dressing: Drawtex 4x4 in (Generic) 1 x Per Day/15 Days Discharge Instructions: Apply over primary dressing as directed. [name], [name] ([id number]) [id number] Page 8 of 8 Secondary Dressing: Woven Gauze Sponge, Non-Sterile 4x4 in (Generic) 1 x Per Day/15 Days Discharge Instructions: Apply over primary dressing as directed. Secured With: Elastic Bandage 4 inch (ACE bandage) (Generic) 1 x Per Day/15 Days Discharge Instructions:  Secure with ACE bandage as directed. Secured With: [employer], 4.5x3.1 (in/yd) (Generic) 1 x Per Day/15 Days Discharge Instructions: Secure with Kerlix as directed. Secured With: 34M Medipore H Soft Cloth Surgical T ape, 4 x 10 (in/yd) (Generic) 1 x Per Day/15 Days Discharge Instructions: Secure with tape as directed. 1. Based on what I am seeing I do believe that the patient is going to continue to monitor for any signs of worsening or infection. Based on what I am seeing I do believe that overall she is making good progress here and in general I do feel like that the wound is showing signs of improvement and very pleased she has been approved for hyperbaric oxygen therapy I think the sooner we get the started the better as far as trying to get this healed and secure limb salvage. 2. I am also can recommend that we have the patient continue with the silver alginate dressings. I think that is probably still our best option at this point and she is in agreement with that plan we will also continue with the ABD pad to follow followed by the drawtex. We will see patient back for reevaluation in 1 week here in the clinic. If anything worsens or changes patient will contact our office for additional recommendations. Electronic Signature(s) Signed: 09/11/2022 10:42:30 AM By: Lenda Kelp PA-C Entered By: Lenda Kelp on 09/11/2022 10:42:30 -------------------------------------------------------------------------------- SuperBill Details Patient Name: Date of Service: VANNIE, HILGERT 09/11/2022 Medical Record Number: [id number] Patient Account Number: [id number] Date of Birth/Sex: Treating RN: 10-17-1965 (56 y.o. [name], [name] Primary Care Provider: [name] Other Clinician: Referring Provider: Treating Provider/Extender: [name] in Treatment: 13 Diagnosis Coding  ICD-10 Codes Code Description [phone] Other chronic osteomyelitis, right ankle and  foot E11.621 Type 2 diabetes mellitus with foot ulcer L97.412 Non-pressure chronic ulcer of right heel and midfoot with fat layer exposed I89.0 Lymphedema, not elsewhere classified Facility Procedures : CPT4 Code: 04540981 Description: 99213 - WOUND CARE VISIT-LEV 3 EST PT Modifier: Quantity: 1 Physician Procedures : CPT4 Code Description Modifier [id number] 99213 - WC PHYS LEVEL 3 - EST PT ICD-10 Diagnosis Description M86.671 Other chronic osteomyelitis, right ankle and foot E11.621 Type 2 diabetes mellitus with foot ulcer L97.412 Non-pressure chronic ulcer of right  heel and midfoot with fat layer exposed I89.0 Lymphedema, not elsewhere classified Quantity: 1 Electronic Signature(s) Signed: 09/11/2022 10:43:31 AM By: Lenda Kelp PA-C Entered By: Lenda Kelp on 09/11/2022 10:43:31

## 2022-09-13 ENCOUNTER — Encounter (HOSPITAL_BASED_OUTPATIENT_CLINIC_OR_DEPARTMENT_OTHER): Payer: No Typology Code available for payment source | Admitting: General Surgery

## 2022-09-13 DIAGNOSIS — E11621 Type 2 diabetes mellitus with foot ulcer: Secondary | ICD-10-CM | POA: Diagnosis not present

## 2022-09-13 LAB — GLUCOSE, CAPILLARY
Glucose-Capillary: 181 mg/dL — ABNORMAL HIGH (ref 70–99)
Glucose-Capillary: 144 mg/dL — ABNORMAL HIGH (ref 70–99)

## 2022-09-14 NOTE — Progress Notes (Signed)
Fohl, [name] ([id number]) [id number] Page 1 of 2 Visit Report for 09/13/2022 Arrival Information Details Patient Name: Date of Service: PAMLEA, FINDER 09/13/2022 12:00 PM Medical Record Number: [id number] Patient Account Number: [id number] Date of Birth/Sex: Treating RN: 1966/09/14 (56 y.o. [name], Millard.Loa Primary Care Sheylin Scharnhorst: [name] Other Clinician: Haywood Pao Referring Minaal Struckman: Treating Chinmay Squier/Extender: Gertie Fey in Treatment: 13 Visit Information History Since Last Visit All ordered tests and consults were completed: Yes Patient Arrived: Ambulatory Added or deleted any medications: No Arrival Time: 11:44 Any new allergies or adverse reactions: No Accompanied By: son Had a fall or experienced change in No Transfer Assistance: None activities of daily living that may affect Patient Identification Verified: Yes risk of falls: Secondary Verification Process Completed: Yes Signs or symptoms of abuse/neglect since last visito No Patient Requires Transmission-Based Precautions: No Hospitalized since last visit: No Patient Has Alerts: Yes Implantable device outside of the clinic excluding No Patient Alerts: PICC in left arm cellular tissue based products placed in the center since last visit: Pain Present Now: No Electronic Signature(s) Signed: 09/13/2022 4:25:30 PM By: Haywood Pao CHT EMT BS , , Entered By: Haywood Pao on 09/13/2022 16:25:30 -------------------------------------------------------------------------------- Encounter Discharge Information Details Patient Name: Date of Service: ALVITA, FANA 09/13/2022 12:00 PM Medical Record Number: [id number] Patient Account Number: [id number] Date of Birth/Sex: Treating RN: Nov 08, 1965 (56 y.o. [name] Primary Care Lucielle Vokes: [name] Other Clinician: Haywood Pao Referring Yaneth Fairbairn: Treating Ree Alcalde/Extender: Gertie Fey in Treatment: 13 Encounter Discharge Information Items Discharge Condition: Stable Ambulatory Status: Ambulatory Discharge Destination: Home Transportation: Private Auto Accompanied By: son Schedule Follow-up Appointment: No Clinical Summary of Care: Electronic Signature(s) Signed: 09/13/2022 4:36:57 PM By: Haywood Pao CHT EMT BS , , Entered By: Haywood Pao on 09/13/2022 16:36:57 Dralle, Preslynn ([id number]) [id number] Page 2 of 2 -------------------------------------------------------------------------------- Vitals Details Patient Name: Date of Service: COBI, ALDAPE 09/13/2022 12:00 PM Medical Record Number: [id number] Patient Account Number: [id number] Date of Birth/Sex: Treating RN: [birth year] (56 y.o. [name], Millard.Loa Primary Care Sharisse Rantz: [name] Other Clinician: Haywood Pao Referring Haddie Bruhl: Treating Mckinley Adelstein/Extender: Gertie Fey in Treatment: 13 Vital Signs Time Taken: 12:48 Temperature (F): 98.5 Height (in): 65 Pulse (bpm): 108 Weight (lbs): 331 Respiratory Rate (breaths/min): 20 Body Mass Index (BMI): 55.1 Blood Pressure (mmHg): 155/93 Capillary Blood Glucose (mg/dl): 150 Reference Range: 80 - 120 mg / dl Electronic Signature(s) Signed: 09/13/2022 4:26:18 PM By: Haywood Pao CHT EMT BS , , Entered By: Haywood Pao on 09/13/2022 16:26:18

## 2022-09-14 NOTE — Progress Notes (Addendum)
[name], [name] ([id number]) [id number] Page 1 of 3 Visit Report for 09/13/2022 HBO Details Patient Name: Date of Service: [name], [name] 09/13/2022 12:00 PM Medical Record Number: [id number] Patient Account Number: [id number] Date of Birth/Sex: Treating RN: November 24, 1965 (56 y.o. [name], Millard.Loa Primary Care Kassidy Frankson: [name] Other Clinician: Haywood Pao Referring Kasim Mccorkle: Treating Morgyn Marut/Extender: Gertie Fey in Treatment: 13 HBO Treatment Course Details Treatment Course Number: 1 Ordering Lorie Cleckley: Lenda Kelp T Treatments Ordered: otal 40 HBO Treatment Start Date: 09/13/2022 HBO Indication: Diabetic Ulcer(s) of the Lower Extremity Standard/Conservative Wound Care tried and failed greater than or equal to 30 days Wound #1 Right Calcaneus HBO Treatment Details Treatment Number: 1 Patient Type: Outpatient Chamber Type: Monoplace Chamber Serial #: [id number] Treatment Protocol: 2.5 ATA with 90 minutes oxygen, with two 5 minute air breaks Treatment Details Compression Rate Down: 1.0 psi / minute De-Compression Rate Up: 1.0 psi / minute A breaks and ir Compress Tx Pressure breathing periods Decompress Decompress Begins Reached (leave unused spaces Begins Ends blank) Chamber Pressure (ATA 1 2.5 2.5 2.5 2.5 2.5 - - 2.5 1 ) Clock Time (24 hr) 13:04 13:29 14:01 14:06 - - - - 13:59 14:18 Treatment Length: 74 (minutes) Treatment Segments: 2 Vital Signs Capillary Blood Glucose Reference Range: 80 - 120 mg / dl HBO Diabetic Blood Glucose Intervention Range: <131 mg/dl or >546 mg/dl Type: Time Vitals Blood Respiratory Capillary Blood Glucose Pulse Action Pulse: Temperature: Taken: Pressure: Rate: Glucose (mg/dl): Meter #: Oximetry (%) Taken: Pre 12:48 155/93 108 20 98.5 181 1 none per protocol Post 14:38 142/85 92 18 97.7 144 1 none per protocol Treatment Response Treatment Toleration: Fair Treatment Completion Status:  Treatment Aborted/Not Restarted Reason: Patient Choice Treatment Notes Patient arrived and prepared for treatment. Delivered orientation and allowed patient time to sign consent for treatment. Patient had questions about her dressings. Patient safely placed in chamber after performing safety check. patient travelled at rate set of 1 psi/min, with purge flow rate at the highest which caused travel rate to be approximately 0.81 psi/min with 1 psi per 14 seconds. Upon reaching 12 psig, I increased travel rate to actually be 1 psi/min. Patient denied any issues with ear equalization. Patient tolerated travel and tolerated treatment until her first air break at which time she stated she had to use the restroom. Alerted physician and treatment was ended with patient opting to "try again Monday" with better planning. Patient tolerated 1 psi/min during decompression. Patient denied any issues with ear equalization. Patient stated she "feels fine." Patient was stable upon discharge. Additional Procedure Documentation Tissue Sevierity: Necrosis of bone Physician HBO Attestation: I certify that I supervised this HBO treatment in accordance with Medicare guidelines. A trained emergency response team is readily available per Yes hospital policies and procedures. Continue HBOT as ordered. Yes [name], [name] ([id number]) [id number] Page 2 of 3 Electronic Signature(s) Signed: 09/24/2022 7:55:44 AM By: Duanne Guess MD FACS Previous Signature: 09/13/2022 5:42:15 PM Version By: Haywood Pao CHT EMT BS , , Previous Signature: 09/13/2022 4:49:16 PM Version By: Haywood Pao CHT EMT BS , , Previous Signature: 09/13/2022 4:48:44 PM Version By: Haywood Pao CHT EMT BS , , Previous Signature: 09/13/2022 4:35:41 PM Version By: Haywood Pao CHT EMT BS , , Entered By: Duanne Guess on 09/24/2022  07:55:44 -------------------------------------------------------------------------------- HBO Safety Checklist Details Patient Name: Date of Service: [name] 09/13/2022 12:00 PM Medical Record Number: [id number] Patient Account Number: [id number] Date of Birth/Sex: Treating RN: Aug 20, 1966 (56 y.o. F) De[place],  RFFMB Primary Care Taniela Feltus: [name] Other Clinician: Haywood Pao Referring Paul Trettin: Treating Londen Bok/Extender: Gertie Fey in Treatment: 13 HBO Safety Checklist Items Safety Checklist Consent Form Signed Patient voided / foley secured and emptied When did you last eato 0800 Last dose of injectable or oral agent yesterday Ostomy pouch emptied and vented if applicable NA All implantable devices assessed, documented and approved NA Intravenous access site secured and place R arm PICC Valuables secured Linens and cotton and cotton/polyester blend (less than 51% polyester) Personal oil-based products / skin lotions / body lotions removed Wigs or hairpieces removed NA Smoking or tobacco materials removed NA Books / newspapers / magazines / loose paper removed Cologne, aftershave, perfume and deodorant removed Jewelry removed (may wrap wedding band) Make-up removed Hair care products removed Battery operated devices (external) removed Heating patches and chemical warmers removed Titanium eyewear removed Nail polish cured greater than 10 hours NA Casting material cured greater than 10 hours NA Hearing aids removed NA Loose dentures or partials removed NA Prosthetics have been removed NA Patient demonstrates correct use of air break device (if applicable) Patient concerns have been addressed Patient grounding bracelet on and cord attached to chamber Specifics for Inpatients (complete in addition to above) Medication sheet sent with patient NA Intravenous medications needed or due during therapy sent with patient NA Drainage  tubes (e.g. nasogastric tube or chest tube secured and vented) NA Endotracheal or Tracheotomy tube secured NA Cuff deflated of air and inflated with saline NA Airway suctioned NA Notes Paper version used prior to treatment. Electronic Signature(s) Signed: 09/13/2022 4:27:38 PM By: Haywood Pao CHT EMT BS , , Goggins, Ermelinda ([id number]) [id number] Page 3 of 3 Entered By: Haywood Pao on 09/13/2022 16:27:38

## 2022-09-16 ENCOUNTER — Encounter (HOSPITAL_BASED_OUTPATIENT_CLINIC_OR_DEPARTMENT_OTHER): Payer: No Typology Code available for payment source | Admitting: Internal Medicine

## 2022-09-16 DIAGNOSIS — E11621 Type 2 diabetes mellitus with foot ulcer: Secondary | ICD-10-CM | POA: Diagnosis not present

## 2022-09-16 NOTE — Progress Notes (Addendum)
Ahles, [name] ([id number]) [id number] Page 1 of 2 Visit Report for 09/16/2022 Arrival Information Details Patient Name: Date of Service: [name], [name] 09/16/2022 12:00 PM Medical Record Number: [id number] Patient Account Number: [id number] Date of Birth/Sex: Treating RN: 03-Mar-1966 (56 y.o. [name], Millard.Loa Primary Care Honestii Marton: [name] Other Clinician: Haywood Pao Referring Andora Krull: Treating Cristal Qadir/Extender: Delora Fuel in Treatment: 13 Visit Information History Since Last Visit All ordered tests and consults were completed: Yes Patient Arrived: Ambulatory Added or deleted any medications: No Arrival Time: 11:42 Any new allergies or adverse reactions: No Accompanied By: son Had a fall or experienced change in No Transfer Assistance: None activities of daily living that may affect Patient Identification Verified: Yes risk of falls: Secondary Verification Process Completed: Yes Signs or symptoms of abuse/neglect since last visito No Patient Requires Transmission-Based Precautions: No Hospitalized since last visit: No Patient Has Alerts: Yes Implantable device outside of the clinic excluding No Patient Alerts: PICC in left arm cellular tissue based products placed in the center since last visit: Pain Present Now: No Electronic Signature(s) Signed: 09/16/2022 4:13:10 PM By: Haywood Pao CHT EMT BS , , Previous Signature: 09/16/2022 1:16:31 PM Version By: Haywood Pao CHT EMT BS , , Entered By: Haywood Pao on 09/16/2022 16:13:10 -------------------------------------------------------------------------------- Encounter Discharge Information Details Patient Name: Date of Service: [name], [name] 09/16/2022 12:00 PM Medical Record Number: [id number] Patient Account Number: [id number] Date of Birth/Sex: Treating RN: 1966-09-18 (56 y.o. [name] Primary Care Daytona Hedman: [name] Other Clinician:  Haywood Pao Referring Marcelino Campos: Treating Alazay Leicht/Extender: Delora Fuel in Treatment: 13 Encounter Discharge Information Items Discharge Condition: Stable Ambulatory Status: Ambulatory Discharge Destination: Home Transportation: Private Auto Accompanied By: son Schedule Follow-up Appointment: No Clinical Summary of Care: Electronic Signature(s) Signed: 09/16/2022 4:14:07 PM By: Haywood Pao CHT EMT BS , , Previous Signature: 09/16/2022 3:51:38 PM Version By: Haywood Pao CHT EMT BS , , Entered By: Haywood Pao on 09/16/2022 16:14:07 [name], [name] ([id number]) [id number] Page 2 of 2 -------------------------------------------------------------------------------- Vitals Details Patient Name: Date of Service: [name], [name] 09/16/2022 12:00 PM Medical Record Number: [id number] Patient Account Number: [id number] Date of Birth/Sex: Treating RN: 08-Sep-1966 (56 y.o. [name], Millard.Loa Primary Care Zorina Mallin: [name] Other Clinician: Haywood Pao Referring Vinetta Brach: Treating Eevie Lapp/Extender: Delora Fuel in Treatment: 13 Vital Signs Time Taken: 11:57 Temperature (F): 98.1 Height (in): 65 Pulse (bpm): 109 Weight (lbs): 331 Respiratory Rate (breaths/min): 18 Body Mass Index (BMI): 55.1 Blood Pressure (mmHg): 138/98 Capillary Blood Glucose (mg/dl): 170 Reference Range: 80 - 120 mg / dl Electronic Signature(s) Signed: 09/16/2022 4:13:18 PM By: Haywood Pao CHT EMT BS , , Previous Signature: 09/16/2022 1:17:15 PM Version By: Haywood Pao CHT EMT BS , , Entered By: Haywood Pao on 09/16/2022 16:13:18

## 2022-09-16 NOTE — Progress Notes (Signed)
[name] ([id number]) [id number] Page 1 of 3 Visit Report for 09/16/2022 HBO Details Patient Name: Date of Service: [name], [name] 09/16/2022 12:00 PM Medical Record Number: [id number] Patient Account Number: [id number] Date of Birth/Sex: Treating RN: [birth year] (56 y.o. [name], Millard.Loa Primary Care [name]: [name] Other Clinician: Haywood [name] Referring [name]: Treating [name]/Extender: [name] in Treatment: 13 HBO Treatment Course Details Treatment Course Number: 1 Ordering [name]: [name] T Treatments Ordered: otal 40 HBO Treatment Start Date: 09/13/2022 HBO Indication: Diabetic Ulcer(s) of the Lower Extremity Standard/Conservative Wound Care tried and failed greater than or equal to 30 days Wound #1 Right Calcaneus HBO Treatment Details Treatment Number: 2 Patient Type: Outpatient Chamber Type: Monoplace Chamber Serial #: [id number] Treatment Protocol: 2.5 ATA with 90 minutes oxygen, with two 5 minute air breaks Treatment Details Compression Rate Down: 1.0 psi / minute De-Compression Rate Up: A breaks and breathing ir Compress Tx Pressure periods Decompress Decompress Begins Reached (leave unused spaces Begins Ends blank) Chamber Pressure (ATA 1 2.5 2.5 2.5 2.5 2.5 - - 2.5 1 ) Clock Time (24 hr) 12:16 12:38 13:08 13:13 13:43 13:48 - - 14:16 14:27 Treatment Length: 131 (minutes) Treatment Segments: 4 Vital Signs Capillary Blood Glucose Reference Range: 80 - 120 mg / dl HBO Diabetic Blood Glucose Intervention Range: <131 mg/dl or >237 mg/dl Type: Time Vitals Blood Pulse: Respiratory Temperature: Capillary Blood Glucose Pulse Action Taken: Pressure: Rate: Glucose (mg/dl): Meter #: Oximetry (%) Taken: Pre 11:57 138/98 109 18 98.1 174 1 pulse elevated, patient states she is nervous Post 14:30 145/81 94 18 97.7 140 1 none per protocol Treatment Response Treatment Toleration: Well Treatment  Completion Status: Treatment Completed without Adverse Event Treatment Notes [name] arrived and prepared for treatment. Pulse was elevated, she stated she was nervous. Patient safely placed in the chamber after completing the safety check. Chamber was compressed at approximately 0.80 psi/min until reaching 5 psig and the rate set was changed to allow chamber to travel at 1 psi/min. Patient denied any issues with ear equalization. Patient tolerated treatment until approximately 2 minutes prior to ending the last 30 minute segment. Patient stated that she may have a panic attack if she doesn't get out soon. I started decompression at 1 psi/min and increased it to 1.5 psi/min after communicating with her how important it is to keep me informed of her ear equalization and that she would have to increase her equalization frequency to match the speed of decompression. Patient stated that she felt comfortable at 1.5 psi/min and I increased rate set to 2 psi/min while constantly in communication with the patient. Patient denied ear equalization issues and/or pain after treatment stating she felt fine. I helped her prepare for returning to the changing room and helped her walk over. Her son arrived and patient finished dressing. Patient was stable upon discharge. Additional Procedure Documentation Tissue Sevierity: Necrosis of bone [name] Notes No concerns with treatment given Physician HBO Attestation: I certify that I supervised this HBO treatment in accordance with Medicare guidelines. A trained emergency response team is readily available per Yes hospital policies and procedures. [name] ([id number]) [id number] Page 2 of 3 Continue HBOT as ordered. Yes Electronic Signature(s) Signed: 09/17/2022 3:46:52 PM By: [name] Najjar MD Previous Signature: 09/16/2022 4:13:37 PM Version By: [name] CHT EMT BS , , Previous Signature: 09/16/2022 5:01:06 PM Version  By: [name] Najjar MD Previous Signature: 09/16/2022 3:46:24 PM Version By: [name] CHT EMT BS , , Previous  Signature: 09/16/2022 1:20:10 PM Version By: [name] CHT EMT BS , , Entered By: [name] on 09/17/2022 12:31:40 -------------------------------------------------------------------------------- HBO Safety Checklist Details Patient Name: Date of Service: [name] 09/16/2022 12:00 PM Medical Record Number: [id number] Patient Account Number: [id number] Date of Birth/Sex: Treating RN: [birth year] (56 y.o. [name], Millard.Loa Primary Care [name]: [name] Other Clinician: Haywood [name] Referring [name]: Treating [name]/Extender: [name] in Treatment: 13 HBO Safety Checklist Items Safety Checklist Consent Form Signed Patient voided / foley secured and emptied When did you last eato 0730 Last dose of injectable or oral agent Last Evening Ostomy pouch emptied and vented if applicable NA All implantable devices assessed, documented and approved NA Intravenous access site secured and place NA Valuables secured Linens and cotton and cotton/polyester blend (less than 51% polyester) Personal oil-based products / skin lotions / body lotions removed Wigs or hairpieces removed NA Smoking or tobacco materials removed NA Books / newspapers / magazines / loose paper removed Cologne, aftershave, perfume and deodorant removed Jewelry removed (may wrap wedding band) Make-up removed Hair care products removed Battery operated devices (external) removed Heating patches and chemical warmers removed Titanium eyewear removed Nail polish cured greater than 10 hours NA Casting material cured greater than 10 hours NA Hearing aids removed NA Loose dentures or partials removed NA Prosthetics have been removed NA Patient demonstrates correct use of air break device (if applicable) Patient concerns have been  addressed Patient grounding bracelet on and cord attached to chamber Specifics for Inpatients (complete in addition to above) Medication sheet sent with patient NA Intravenous medications needed or due during therapy sent with patient NA Drainage tubes (e.g. nasogastric tube or chest tube secured and vented) NA Endotracheal or Tracheotomy tube secured NA Cuff deflated of air and inflated with saline NA Airway suctioned NA Notes Paper version used prior to treatment. Electronic Signature(s) [name], [name] ([id number]) [id number] Page 3 of 3 Signed: 09/16/2022 4:13:27 PM By: [name] CHT EMT BS , , Previous Signature: 09/16/2022 3:51:13 PM Version By: [name] CHT EMT BS , , Previous Signature: 09/16/2022 1:18:42 PM Version By: [name] CHT EMT BS , , Entered By: [name] on 09/16/2022 16:13:26

## 2022-09-17 ENCOUNTER — Encounter (HOSPITAL_BASED_OUTPATIENT_CLINIC_OR_DEPARTMENT_OTHER): Payer: BC Managed Care – PPO | Admitting: Internal Medicine

## 2022-09-17 LAB — GLUCOSE, CAPILLARY
Glucose-Capillary: 174 mg/dL — ABNORMAL HIGH (ref 70–99)
Glucose-Capillary: 140 mg/dL — ABNORMAL HIGH (ref 70–99)

## 2022-09-17 NOTE — Progress Notes (Signed)
Kudrna, [name] ([id number]) [id number] Page 1 of 1 Visit Report for 09/16/2022 SuperBill Details Patient Name: Date of Service: DORLA, GUIZAR 09/16/2022 Medical Record Number: [id number] Patient Account Number: [id number] Date of Birth/Sex: Treating RN: 10/24/1965 (56 y.o. [name], Millard.Loa Primary Care Provider: [name] Other Clinician: Haywood Pao Referring Provider: Treating Provider/Extender: Delora Fuel in Treatment: 13 Diagnosis Coding ICD-10 Codes Code Description [phone] Other chronic osteomyelitis, right ankle and foot E11.621 Type 2 diabetes mellitus with foot ulcer L97.412 Non-pressure chronic ulcer of right heel and midfoot with fat layer exposed I89.0 Lymphedema, not elsewhere classified Facility Procedures CPT4 Code Description Modifier Quantity [id number]-(Facility Use Only) HBOT full body chamber, , 4 ICD-10 Diagnosis Description E11.621 Type 2 diabetes mellitus with foot ulcer L97.412 Non-pressure chronic ulcer of right heel and midfoot with fat layer exposed M86.671 Other chronic osteomyelitis, right ankle and foot I89.0 Lymphedema, not elsewhere classified Physician Procedures Quantity CPT4 Code Description Modifier [id number] - WC PHYS HYPERBARIC OXYGEN THERAPY 1 ICD-10 Diagnosis Description E11.621 Type 2 diabetes mellitus with foot ulcer L97.412 Non-pressure chronic ulcer of right heel and midfoot with fat layer exposed M86.671 Other chronic osteomyelitis, right ankle and foot I89.0 Lymphedema, not elsewhere classified Electronic Signature(s) Signed: 09/16/2022 4:13:47 PM By: Haywood Pao CHT EMT BS , , Signed: 09/16/2022 5:01:06 PM By: Baltazar Najjar MD Previous Signature: 09/16/2022 3:47:05 PM Version By: Haywood Pao CHT EMT BS , , Entered By: Haywood Pao on 09/16/2022 16:13:47

## 2022-09-18 ENCOUNTER — Encounter (HOSPITAL_BASED_OUTPATIENT_CLINIC_OR_DEPARTMENT_OTHER): Payer: BC Managed Care – PPO | Admitting: Internal Medicine

## 2022-09-18 NOTE — Progress Notes (Signed)
  Care Coordination Note  09/18/2022 Name: [name] MRN: [id number] DOB: 03-11-1966  [name] is a 56 y.o. year old female who is a primary care patient of Hasanaj, Myra Gianotti, MD and is actively engaged with the care management team. I reached out to Darral Dash by phone today to assist with re-scheduling a follow up visit with the RN Case Manager  Follow up plan: Telephone appointment with care management team member scheduled for:09/20/22  [facility] Coordination Care Guide  Direct Dial: [phone]

## 2022-09-18 NOTE — Progress Notes (Signed)
  Care Coordination Note  09/18/2022 Name: [name] MRN: [id number] DOB: Oct 01, 1966  [name] is a 56 y.o. year old female who is a primary care patient of Hasanaj, Myra Gianotti, MD and is actively engaged with the care management team. I reached out to Darral Dash by phone today to assist with re-scheduling a follow up visit with the RN Case Manager  Follow up plan: Unsuccessful telephone outreach attempt made. A HIPAA compliant phone message was left for the patient providing contact information and requesting a return call.  We have been unable to make contact with the patient for follow up. The care management team is available to follow up with the patient after provider conversation with the patient regarding recommendation for care management engagement and subsequent re-referral to the care management team.   [facility] Coordination Care Guide  Direct Dial: [phone]

## 2022-09-19 ENCOUNTER — Encounter (HOSPITAL_BASED_OUTPATIENT_CLINIC_OR_DEPARTMENT_OTHER): Payer: Self-pay | Admitting: Internal Medicine

## 2022-09-20 ENCOUNTER — Encounter (HOSPITAL_BASED_OUTPATIENT_CLINIC_OR_DEPARTMENT_OTHER): Payer: BC Managed Care – PPO | Admitting: Internal Medicine

## 2022-09-20 ENCOUNTER — Ambulatory Visit: Payer: Self-pay | Admitting: *Deleted

## 2022-09-20 NOTE — Patient Outreach (Signed)
  Care Coordination   09/20/2022 Name: [name] MRN: [id number] DOB: [birth year]   Care Coordination Outreach Attempts:  An unsuccessful telephone outreach was attempted for a scheduled appointment today. Unsuccessful #2.  Follow Up Plan:  Additional outreach attempts will be made to offer the patient care coordination information and services.   Encounter Outcome:  No Answer   Care Coordination Interventions:  No, not indicated    Demetrios Loll, BSN, RN-BC RN Care Coordinator [facility]  [facility] Direct Dial: [phone] Main #: [phone]

## 2022-09-23 ENCOUNTER — Encounter (HOSPITAL_BASED_OUTPATIENT_CLINIC_OR_DEPARTMENT_OTHER): Payer: No Typology Code available for payment source | Admitting: Internal Medicine

## 2022-09-23 DIAGNOSIS — E11621 Type 2 diabetes mellitus with foot ulcer: Secondary | ICD-10-CM | POA: Diagnosis not present

## 2022-09-23 LAB — GLUCOSE, CAPILLARY
Glucose-Capillary: 190 mg/dL — ABNORMAL HIGH (ref 70–99)
Glucose-Capillary: 204 mg/dL — ABNORMAL HIGH (ref 70–99)

## 2022-09-23 NOTE — Progress Notes (Signed)
[name], [name] ([id number]) [id number] Page 1 of 3 Visit Report for 09/23/2022 HBO Details Patient Name: Date of Service: [name], [name] 09/23/2022 12:00 PM Medical Record Number: [id number] Patient Account Number: [id number] Date of Birth/Sex: Treating RN: [name] (56 y.o. [name] Primary Care [name]: [name] Other Clinician: Karl [name] Referring [name]: Treating [name]/Extender: [name] in Treatment: 14 HBO Treatment Course Details Treatment Course Number: 1 Ordering Kenyatta [name]: [name] T Treatments Ordered: otal 40 HBO Treatment Start Date: 09/13/2022 HBO Indication: Diabetic Ulcer(s) of the Lower Extremity Standard/Conservative Wound Care tried and failed greater than or equal to 30 days Wound #1 Right Calcaneus HBO Treatment Details Treatment Number: 3 Patient Type: Outpatient Chamber Type: Monoplace Chamber Serial #: [id number] Treatment Protocol: 2.5 ATA with 90 minutes oxygen, with two 5 minute air breaks Treatment Details Compression Rate Down: 1.0 psi / minute De-Compression Rate Up: 2.0 psi / minute A breaks and ir Compress Tx Pressure breathing periods Decompress Decompress Begins Reached (leave unused Begins Ends spaces blank) Chamber Pressure (ATA 1 2.5 2.5 2.5 2.5 2.5 - - 2.5 1 ) Clock Time (24 hr) 11:54 12:16 11:47 12:51 - - - - 12:51 13:00 Treatment Length: 66 (minutes) Treatment Segments: 2 Vital Signs Capillary Blood Glucose Reference Range: 80 - 120 mg / dl HBO Diabetic Blood Glucose Intervention Range: <131 mg/dl or >664 mg/dl Time Vitals Blood Respiratory Capillary Blood Glucose Pulse Action Type: Pulse: Temperature: Taken: Pressure: Rate: Glucose (mg/dl): Meter #: Oximetry (%) Taken: Pre 11:49 142/88 99 18 98.2 190 Post 13:38 145/91 92 18 98.2 204 Treatment Response Treatment Toleration: Fair Adverse Events: 1:Confinement Anxiety Treatment Completion Status:  Treatment Completed with Adverse Event Treatment Notes The patient stated that she has started [name] 2mg  today. The patient was placed in chamber and compression rate set at 1.0 PSI/min. The patient was able to clear without any problems. At 1249 the patient stated that she may have a panic attack and needed to get out. Dr. called to chamber side. Spoke with patient, stated to start decompression. Decompression rate set at 2.0 SPI/min. With no problems noted with decompression. At 1300 the patient was removed from the chamber without harm. She stated that she felt better now she was out. The patient helped to the bathroom, changed and spoke with Dr. Leanord [name]. Dr. Leanord [name] stated not to come in for treatment tomorrow 09/24/2022, and to see [name] on Wednesday before coming to treatment. Additional Procedure Documentation Tissue Sevierity: Necrosis of muscle [name] Notes Patient developed extreme anxiety and asked to be removed from the chamber. We have given her Valium 2 mg apparently this was insufficient. She says [name] (Friday) [id number].pdf Page 2 of 3 she has claustrophobia. She was fine after she exited the chamber. I do not know if she is going to be able to tolerate hyperbarics which is indeed unfortunate Physician HBO Attestation: I certify that I supervised this HBO treatment in accordance with Medicare guidelines. A trained emergency response team is readily available per Yes hospital policies and procedures. Continue HBOT as ordered. Yes Electronic Signature(s) Signed: 09/23/2022 5:06:55 PM By: 09/25/2022 MD Previous Signature: 09/23/2022 2:44:48 PM Version By: 09/25/2022 EMT Previous Signature: 09/23/2022 2:21:41 PM Version By: 09/25/2022 EMT Entered By: [name] on 09/23/2022 17:04:46 -------------------------------------------------------------------------------- HBO Safety Checklist Details Patient Name: Date of  Service: [name] 09/23/2022 12:00 PM Medical Record Number: 09/25/2022 Patient Account Number: [id number] Date of Birth/Sex: Treating RN: [name] (56 y.o. 59  Primary Care Merrie Epler: [name] Other Clinician: Karl [name] Referring Jazell Rosenau: Treating Kynsley Whitehouse/Extender: [name] in Treatment: 14 HBO Safety Checklist Items Safety Checklist Consent Form Signed Patient voided / foley secured and emptied When did you last eato 1100 Last dose of injectable or oral agent Yesterday Ostomy pouch emptied and vented if applicable NA All implantable devices assessed, documented and approved PICC line Intravenous access site secured and place PICC Line Valuables secured Linens and cotton and cotton/polyester blend (less than 51% polyester) Personal oil-based products / skin lotions / body lotions removed Wigs or hairpieces removed NA Smoking or tobacco materials removed Books / newspapers / magazines / loose paper removed Cologne, aftershave, perfume and deodorant removed Jewelry removed (may wrap wedding band) Make-up removed Hair care products removed Battery operated devices (external) removed Heating patches and chemical warmers removed Titanium eyewear removed NA Nail polish cured greater than 10 hours Casting material cured greater than 10 hours NA Hearing aids removed NA Loose dentures or partials removed NA Prosthetics have been removed NA Patient demonstrates correct use of air break device (if applicable) Patient concerns have been addressed Patient grounding bracelet on and cord attached to chamber Specifics for Inpatients (complete in addition to above) Medication sheet sent with patient NA Intravenous medications needed or due during therapy sent with patient NA Drainage tubes (e.g. nasogastric tube or chest tube secured and vented) NA Endotracheal or Tracheotomy tube secured NA Cuff deflated of air and  inflated with saline NA Airway suctioned NA [name] ([id number]) [id number] Page 3 of 3 Notes The safety checklist was done before the treatment was started. Electronic Signature(s) Signed: 09/23/2022 2:06:00 PM By: [name] EMT Entered By: [name] on 09/23/2022 14:06:00

## 2022-09-23 NOTE — Progress Notes (Signed)
Dalziel, [name] ([id number]) [id number] Page 1 of 2 Visit Report for 09/23/2022 Arrival Information Details Patient Name: Date of Service: [name], [name] 09/23/2022 12:00 PM Medical Record Number: [id number] Patient Account Number: [id number] Date of Birth/Sex: Treating RN: [birth year] (56 y.o. [name], [name] Primary Care Caulin Begley: [name] Other Clinician: Karl Bales Referring [name]: Treating [name]/Extender: [name] in Treatment: 14 Visit Information History Since Last Visit All ordered tests and consults were completed: Yes Patient Arrived: Ambulatory Added or deleted any medications: Yes Arrival Time: 11:25 Any new allergies or adverse reactions: No Accompanied By: son Had a fall or experienced change in No Transfer Assistance: None activities of daily living that may affect Patient Identification Verified: Yes risk of falls: Secondary Verification Process Completed: Yes Signs or symptoms of abuse/neglect since last visito No Patient Requires Transmission-Based Precautions: No Hospitalized since last visit: No Patient Has Alerts: Yes Implantable device outside of the clinic excluding No Patient Alerts: PICC in left arm cellular tissue based products placed in the center since last visit: Pain Present Now: No Electronic Signature(s) Signed: 09/23/2022 2:03:02 PM By: Karl Bales EMT Entered By: Karl Bales on 09/23/2022 14:03:02 -------------------------------------------------------------------------------- Encounter Discharge Information Details Patient Name: Date of Service: [name], [name] 09/23/2022 12:00 PM Medical Record Number: [id number] Patient Account Number: [id number] Date of Birth/Sex: Treating RN: 1966/07/20 (56 y.o. [name] Primary Care Adelis Docter: [name] Other Clinician: Karl Bales Referring [name]: Treating [name]/Extender: [name] in Treatment: 14 Encounter Discharge Information Items Discharge Condition: Stable Ambulatory Status: Ambulatory Discharge Destination: Home Transportation: Private Auto Accompanied By: Shari Heritage Schedule Follow-up Appointment: Yes Clinical Summary of Care: Electronic Signature(s) Signed: 09/23/2022 2:24:33 PM By: Karl Bales EMT Entered By: Karl Bales on 09/23/2022 14:24:33 Whedbee, Eline ([id number]) [id number] Page 2 of 2 -------------------------------------------------------------------------------- Vitals Details Patient Name: Date of Service: [name], [name] 09/23/2022 12:00 PM Medical Record Number: [id number] Patient Account Number: [id number] Date of Birth/Sex: Treating RN: [birth year] (56 y.o. [name] Primary Care Jadarian Mckay: [name] Other Clinician: Karl Bales Referring Libra Gatz: Treating [name]/Extender: [name] in Treatment: 14 Vital Signs Time Taken: 11:49 Temperature (F): 98.2 Height (in): 65 Pulse (bpm): 99 Weight (lbs): 331 Respiratory Rate (breaths/min): 18 Body Mass Index (BMI): 55.1 Blood Pressure (mmHg): 142/88 Capillary Blood Glucose (mg/dl): 315 Reference Range: 80 - 120 mg / dl Electronic Signature(s) Signed: 09/23/2022 2:03:34 PM By: Karl Bales EMT Entered By: Karl Bales on 09/23/2022 14:03:34

## 2022-09-24 ENCOUNTER — Encounter (HOSPITAL_BASED_OUTPATIENT_CLINIC_OR_DEPARTMENT_OTHER): Payer: No Typology Code available for payment source | Admitting: General Surgery

## 2022-09-24 NOTE — Progress Notes (Signed)
Fassnacht, [name] ([id number]) [id number] Page 1 of 2 Visit Report for 09/23/2022 Problem List Details Patient Name: Date of Service: [name], [name] 09/23/2022 12:00 PM Medical Record Number: [id number] Patient Account Number: [id number] Date of Birth/Sex: Treating RN: 1966/03/08 (56 y.o. 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 I89.0 Lymphedema, not  elsewhere classified Facility Procedures : CPT4 Code Description: [id number] G0277-(Facility Use Only) HBOT full body chamber, , ICD-10 Diagnosis Description E11.621 Type 2 diabetes mellitus with foot ulcer L97.412 Non-pressure chronic ulcer of right heel and midfoot wit M86.671 Other chronic  osteomyelitis, right ankle and foot Modifier: h fat layer e Quantity: 2 xposed Physician Procedures : CPT4 Code Description Modifier [id number] - WC PHYS HYPERBARIC OXYGEN THERAPY ICD-10 Diagnosis Description E11.621 Type 2 diabetes mellitus with foot ulcer L97.412 Non-pressure chronic ulcer of right heel and midfoot with fat layer e M86.671 Other  chronic osteomyelitis, right ankle and foot Quantity: 1 xposed Electronic Signature(s) Signed: 09/23/2022 2:23:43 PM By: Karl Bales EMT Signed: 09/23/2022 5:06:55 PM By: Baltazar Najjar MD Entered By: Karl Bales on 09/23/2022 14:23:42

## 2022-09-24 NOTE — Progress Notes (Signed)
Ottley, [name] ([id number]) [id number] Page 1 of 1 Visit Report for 09/13/2022 SuperBill Details Patient Name: Date of Service: [name], [name] 09/13/2022 Medical Record Number: [id number] Patient Account Number: [id number] Date of Birth/Sex: Treating RN: 1966/05/14 (56 y.o. 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 I89.0 Lymphedema, not elsewhere classified Facility Procedures CPT4 Code Description Modifier Quantity [phone] G0277-(Facility Use Only) HBOT full body chamber, , 2 ICD-10 Diagnosis Description E11.621 Type 2 diabetes mellitus with foot ulcer M86.671 Other chronic osteomyelitis, right ankle and foot 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

## 2022-09-25 ENCOUNTER — Encounter (HOSPITAL_BASED_OUTPATIENT_CLINIC_OR_DEPARTMENT_OTHER): Payer: No Typology Code available for payment source | Admitting: Physician Assistant

## 2022-09-25 ENCOUNTER — Encounter (HOSPITAL_BASED_OUTPATIENT_CLINIC_OR_DEPARTMENT_OTHER): Payer: Self-pay

## 2022-09-25 DIAGNOSIS — E11621 Type 2 diabetes mellitus with foot ulcer: Secondary | ICD-10-CM | POA: Diagnosis not present

## 2022-09-26 NOTE — Progress Notes (Signed)
Gemme, [name] ([id number]) [id number] Page 1 of 8 Visit Report for 09/25/2022 Chief Complaint Document Details Patient Name: Date of Service: [name], [name] 09/25/2022 9:30 A M Medical Record Number: [id number] Patient Account Number: [id number] Date of Birth/Sex: Treating RN: 05-28-1966 (56 y.o. F) Primary Care Provider: [name] Other Clinician: Referring Provider: Treating Provider/Extender: [name] in Treatment: 15 Information Obtained from: Patient Chief Complaint Right heel ulcer Electronic Signature(s) Signed: 09/25/2022 10:20:24 AM By: Lenda Kelp PA-C Entered By: Lenda Kelp on 09/25/2022 10:20:24 -------------------------------------------------------------------------------- HPI Details Patient Name: Date of Service: [name], [name] 09/25/2022 9:30 A M Medical Record Number: [id number] Patient Account Number: [id number] Date of Birth/Sex: Treating RN: 28-Jan-1966 (56 y.o. F) Primary Care Provider: [name] Other Clinician: Referring Provider: Treating Provider/Extender: [name] in Treatment: 15 History of Present Illness HPI Description: 06-13-2022 upon evaluation today patient presents for evaluation of her right heel ulcer. She is having a tremendous amount of pain at this location. She tells me that her most recent hemoglobin A1c was 8.7 and that was on 08-23-2021. This ulcer on the heel has been present she states since February 2023 when she had an injection to the heel by podiatry. She states that she began to have increasing pain the wound opened and has been open ever since. 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 She had an x-ray in June if anything can although there did not appear to be any obvious evidence of osteomyelitis at that point. She subsequently has had OR debridement several times of the wound performed by podiatry. Patient does  have a history of lymphedema of the lower extremities she is also a type II diabetic. 06-19-2022 upon evaluation today patient appears to be doing better in regard to the size of her leg which is significantly improved. With that being said she had a lot of drainage from the heel which is completely understandable considering what is going on at this time. There does not appear to be any evidence of active infection there is no warmth or irritation to the leg in general. With that being said the patient does have an issue here with the amount of drainage that is going on I definitely think Zetuvit is good to be the better way to go in place of ABD pads. Also think that she could potentially benefit from 3 times per week dressing changes but again right now we are going to stick with the to change it today, change in her Friday, and then subsequently seeing where things stand next Wednesday. The other option would be to change her to a different day for wound care having her come then say like a Tuesday Friday or Monday Thursday. 06-26-2022 upon evaluation patient's wound bed actually showed signs of doing well in regard to the overall size it was measuring slightly smaller. With that being said unfortunately the biggest issue we see is she still has a tremendous amount of drainage which is what could keep this from being able to use a total contact cast. For that reason I am did going to discuss with her today the possibility of trying Tubigrip to see if that could be of benefit for her. She voiced understanding and is in agreement with giving this a try. 07-03-2022 upon evaluation today patient unfortunately appears to be doing significantly worse at this point in regard to her swelling due to the fact that she was unable to really  wear the Tubigrip. She tells me that it was cutting into her. With that being said she also did have an MRI this MRI revealed that she does have osteomyelitis in the heel this  was actually just performed Monday, 25 September. This does show signs of "early osteomyelitis". Nonetheless this is still concerning to me. The wound also appears to be getting deeper in the center aspect of this which has me a little concerned as well. I do believe that she is likely going require an aggressive approach here to try to get things better. I discussed that in greater detail with her today. I will detailed in the plan. 07-17-2022 upon evaluation today patient appears to be doing poorly in regard to her wound. Unfortunately she does not show any signs of infection systemically though locally this seems to be doing worse with the wound actually being bigger than where it was previous. She did have an MRI that showed evidence of osteomyelitis actually recommended a referral to ID she was evaluated infectious disease and they recommended referral to podiatry and to be honest have recommended amputation based on what the patient tells me today. With that being said this is something that she is not interested in at all to be Withem, Malaia ([id number]) [id number] Page 2 of 8 perfectly honest. For that reason she wants to know what she should do and where she should go at this point that is where the majority of the conversation which was quite lengthy today went. Obviously understand her concern here but I think she is going to have to definitely get off this and likely this means she is going to need to come out of work. 10/18; Since the patient was the patient is using Hydrofera Blue on her right heel wound. She has a new larger open area superior to the wound. The skin on the bottom of her foot is completely macerated. I reviewed the infectious disease note on this patient from 10/9. They recommended keeping the patient on Delofloxacin 450 twice daily until follow-up tomorrow. The patient states she is not going back there as the only thing they wanted to do was  "cut my leg off]. She has not seen podiatry. Her lab works shows a CRP markedly elevated at 240.2 a sedimentation rate of 96 the rest of her CBC is normal creatinine of 0.38 The patient has been to see her primary doctor who is Dr. Leonides Schanz [SP[facility]] In [place]. According the patient Dr. Loralee Pacas has ordered vancomycin and Zosyn to start on November 1. She is not taking the delofloxacin that was suggested by infectious disease. Very angry that they just wanted to consider her for an amputation although I do not specifically see that stated in their note 07-31-2022 upon evaluation today patient still has a significant wound over her plantar aspect of her right l foot region. With that being said I am definitely concerned about the fact that the patient probably does need to be in hyperbaric oxygen therapy at this point. I think we should try to get this going as quickly as possible. She actually is going to be set up with a PICC line next Wednesday and then following this we will have the vancomycin and Zosyn that she will be taking for 8 weeks according to what she tells me. I think this is definitely going to be beneficial and again the goal here is limb salvage. In combination with that I think the hyperbaric oxygen therapy would be ultimately significantly  helpful for her. 08-07-2022 upon evaluation today patient appears to still be doing quite poorly overall she still has not gotten her antibiotics. She got her PICC line today which I assumed she will be getting the first dose of antibiotics as well during that time. With that being said unfortunately she did not get the antibiotics and in fact as I questioned her further she does not even know when or if that is going to be started this week. Again I am very concerned about this considering she already has a PICC line in place we do not want this to clot off on top of the fact that she should already be on these antibiotics she just never went to the ER  for evaluation therefore they never got this started. At this point based on what I am seeing I really think that the ideal thing would be for [place] to see about getting her to the ER for further evaluation and treatment to see if they can get this started for her as soon as possible. The patient voiced understanding and is in agreement with the plan. I gave her recommendations for what should be done also called [facility] infusion therapy in order to see if they would be the ones that seem to be available for her IV infusions but they tell me that they no longer do this that is apparently where the orders were sent to for her infusion therapy. That was by her primary care provider. 08-21-2022 upon evaluation today patient's wound on the bottom of the heel and foot area as well as the toe appear to be doing significantly better. Fortunately I do not see any evidence of infection locally or systemically and everything is measuring much smaller than where we have been. The wound in the left gluteal region also is dramatically improved compared to last time she was here. She has started the antibiotics this is cefepime and vancomycin. She did have a somewhat high trough and therefore they have her hold that today and they are to recheck in the morning I believe she told me. Nonetheless with the antibiotics going she seems to be doing significantly better which is great news. 08-28-2022 upon evaluation today patient appears to be doing somewhat better in regard to her foot there is definitely less drainage than what we have seen in the past. I do believe the antibiotics are helping. With that being said she is also been approved for hyperbaric oxygen therapy and I think as soon as we get this started the better. I discussed that with her today as well. With that being said I do think that the last thing she needs is her chest x-ray when she has that done will be ready to start her into treatment. She is going to  try to go get that today when she leaves which I think would be ideal. 09-11-2022 upon evaluation today patient appears to be doing well with regard to her foot ulcer. This is showing signs of no digression and she does not appear to be having as much drainage as before though it still very wet is not nearly the extreme of what it was previous. Fortunately there does not appear to be any signs of active infection locally nor systemically at this point which is great news. Patient did have her chest x-ray as well as EKG and she is actually approved and completely cleared for HBO therapy from both a clinical standpoint as well as an insurance standpoint.  09-25-2022 upon evaluation today patient appears to be doing well in regard to her foot ulcer. Unfortunately she is not doing as well when it comes to her hyperbaric treatments. She is having a lot of trouble with claustrophobia. She tells me in general that she is not sure she is good to be able to continue. Electronic Signature(s) Signed: 09/25/2022 3:14:54 PM By: Lenda KelpStone III, Quintana Canelo PA-C Entered By: Lenda KelpStone III, Demetri Kerman on 09/25/2022 15:14:54 -------------------------------------------------------------------------------- Physical Exam Details Patient Name: Date of Service: [name], [name] 09/25/2022 9:30 A M Medical Record Number: [id number] Patient Account Number: [id number] Date of Birth/Sex: Treating RN: 10/08/1965 (56 y.o. F) Primary Care Provider: Lia HoppingHasanaj, Xaje Other Clinician: Referring Provider: Treating Provider/Extender: Laurann MontanaStone III, Merle Cirelli Hasanaj, Xaje Weeks in Treatment: 15 Constitutional Well-nourished and well-hydrated in no acute distress. Respiratory normal breathing without difficulty. Psychiatric this patient is able to make decisions and demonstrates good insight into disease process. Alert and Oriented x 3. pleasant and cooperative. Notes Nethery, Desire ([id number]) [id number] Page 3 of 8 Upon inspection  patient's wound bed actually showed signs of good granulation epithelization at this point. Fortunately I do not see signs of active infection locally or systemically which is great news and overall I am extremely pleased any sign with where we stand currently. No fevers, chills, nausea, vomiting, or diarrhea. Electronic Signature(s) Signed: 09/25/2022 3:15:23 PM By: Lenda KelpStone III, Trini Christiansen PA-C Entered By: Lenda KelpStone III, Trysten Bernard on 09/25/2022 15:15:23 -------------------------------------------------------------------------------- Physician Orders Details Patient Name: Date of Service: [name], [name] 09/25/2022 9:30 A M Medical Record Number: [id number] Patient Account Number: [id number] Date of Birth/Sex: Treating RN: 06/29/1966 (56 y.o. Ardis RowanF) [name], [name] Primary Care Provider: Lia HoppingHasanaj, Xaje Other Clinician: Referring Provider: Treating Provider/Extender: Laurann MontanaStone III, Mikeisha Lemonds Hasanaj, Xaje Weeks in Treatment: 15 Verbal / Phone Orders: No Diagnosis Coding Follow-up Appointments ppointment in 2 weeks. - w/ Allen DerryHoyt Stone, PA on WEdnesday's Return A Other: - Ensure you continue oral antibiotics until PICC line place, then start and finish all IV antibiotics- Vancomycin and Zosyn (ordered primary care provider). Anesthetic (In clinic) Topical Lidocaine 5% applied to wound bed Bathing/ Shower/ Hygiene May shower with protection but do not get wound dressing(s) wet. Edema Control - Lymphedema / SCD / Other Elevate legs to the level of the heart or above for 30 minutes daily and/or when sitting, a frequency of: - 3-4 times a day throughout the day. Avoid standing for long periods of time. Off-Loading Open toe surgical shoe to: - w/ peg assist Other: - Minimize walking and standing as much as possible to right foot to aid in offloading to wound. While at work use the wheelchair for mobility. Hyperbaric Oxygen Therapy Wound #1 Right Calcaneus Evaluate for HBO Therapy Indication: - Wagner Grade 3 to right  calcaneus If appropriate for treatment, begin HBOT per protocol: 2.0 ATA for 90 Minutes without A Breaks - Pt. to restart at this rate and pressure if she decides to continue HBO. This will be for however many ir dives she has left. Total Number of Treatments: - 40 One treatments per day (delivered Monday through Friday unless otherwise specified in Special Instructions below): Finger stick Blood Glucose Pre- and Post- HBOT Treatment. Follow Hyperbaric Oxygen Glycemia Protocol Afrin (Oxymetazoline HCL) 0.05% nasal spray - 1 spray in both nostrils daily as needed prior to HBO treatment for difficulty clearing ears Wound Treatment Wound #1 - Calcaneus Wound Laterality: Right Cleanser: Soap and Water 1 x Per Day/15 Days Discharge Instructions: May shower and wash wound with dial antibacterial soap and water  prior to dressing change. Cleanser: Wound Cleanser (Generic) 1 x Per Day/15 Days Discharge Instructions: Cleanse the wound with wound cleanser prior to applying a clean dressing using gauze sponges, not tissue or cotton balls. Peri-Wound Care: Triamcinolone 15 (g) 1 x Per Day/15 Days Discharge Instructions: Use triamcinolone 15 (g) as directed Peri-Wound Care: Zinc Oxide Ointment 30g tube 1 x Per Day/15 Days Discharge Instructions: Apply Zinc Oxide to periwound with each dressing change Peri-Wound Care: Sween Lotion (Moisturizing lotion) 1 x Per Day/15 Days Discharge Instructions: Apply moisturizing lotion as directed Prim Dressing: KerraCel Ag Gelling Fiber Dressing, 4x5 in (silver alginate) (Generic) 1 x Per Day/15 Days ary [name], [name] ([id number]) [id number] Page 4 of 8 Discharge Instructions: Apply silver alginate to wound bed as instructed Secondary Dressing: ABD Pad, 5x9 (Generic) 1 x Per Day/15 Days Discharge Instructions: Apply over primary dressing as directed. Secondary Dressing: Drawtex 4x4 in (Generic) 1 x Per Day/15 Days Discharge Instructions:  Apply over primary dressing as directed. Secondary Dressing: Woven Gauze Sponge, Non-Sterile 4x4 in (Generic) 1 x Per Day/15 Days Discharge Instructions: Apply over primary dressing as directed. Secured With: Elastic Bandage 4 inch (ACE bandage) (Generic) 1 x Per Day/15 Days Discharge Instructions: Secure with ACE bandage as directed. Secured With: [employer], 4.5x3.1 (in/yd) (Generic) 1 x Per Day/15 Days Discharge Instructions: Secure with Kerlix as directed. Secured With: 46M Medipore H Soft Cloth Surgical T ape, 4 x 10 (in/yd) (Generic) 1 x Per Day/15 Days Discharge Instructions: Secure with tape as directed. GLYCEMIA INTERVENTIONS PROTOCOL PRE-HBO GLYCEMIA INTERVENTIONS ACTION INTERVENTION Obtain pre-HBO capillary blood glucose (ensure 1 physician order is in chart). A. Notify HBO physician and await physician orders. 2 If result is 70 mg/dl or below: B. If the result meets the hospital definition of a critical result, follow hospital policy. A. Give patient an 8 ounce Glucerna Shake, an 8 ounce Ensure, or 8 ounces of a Glucerna/Ensure equivalent dietary supplement*. B. Wait 30 minutes. If result is 71 mg/dl to 564 mg/dl: C. Retest patients capillary blood glucose (CBG). D. If result greater than or equal to 110 mg/dl, proceed with HBO. If result less than 110 mg/dl, notify HBO physician and consider holding HBO. If result is 131 mg/dl to 332 mg/dl: A. Proceed with HBO. A. Notify HBO physician and await physician orders. B. It is recommended to hold HBO and do If result is 250 mg/dl or greater: blood/urine ketone testing. C. If the result meets the hospital definition of a critical result, follow hospital policy. POST-HBO GLYCEMIA INTERVENTIONS ACTION INTERVENTION Obtain post HBO capillary blood glucose (ensure 1 physician order is in chart). A. Notify HBO physician and await physician orders. 2 If result is 70 mg/dl or below: B. If the result meets the  hospital definition of a critical result, follow hospital policy. A. Give patient an 8 ounce Glucerna Shake, an 8 ounce Ensure, or 8 ounces of a Glucerna/Ensure equivalent dietary supplement*. B. Wait 15 minutes for symptoms of If result is 71 mg/dl to 951 mg/dl: hypoglycemia (i.e. nervousness, anxiety, sweating, chills, clamminess, irritability, confusion, tachycardia or dizziness). C. If patient asymptomatic, discharge patient. If patient symptomatic, repeat capillary blood glucose (CBG) and notify HBO physician. If result is 101 mg/dl to 884 mg/dl: A. Discharge patient. A. Notify HBO physician and await physician orders. B. It is recommended to do blood/urine ketone If result is 250 mg/dl or greater: testing. C. If the result meets the hospital definition of a critical result, follow hospital policy. *Juice or candies  are NOT equivalent products. If patient refuses the Glucerna or Ensure, please consult the hospital dietitian for an appropriate substitute. Electronic Signature(s) Signed: 09/25/2022 5:39:23 PM By: Fonnie Mu RN Signed: 09/25/2022 6:40:18 PM By: Lenda Kelp PA-C Entered By: Fonnie Mu on 09/25/2022 10:28:38 [name], [name] ([id number]) [id number] Page 5 of 8 -------------------------------------------------------------------------------- Problem List Details Patient Name: Date of Service: [name], [name] 09/25/2022 9:30 A M Medical Record Number: [id number] Patient Account Number: [id number] Date of Birth/Sex: Treating RN: 02-10-1966 (56 y.o. F) Primary Care Provider: [name] Other Clinician: Referring Provider: Treating Provider/Extender: [name] in Treatment: 15 Active Problems ICD-10 Encounter Code Description Active Date MDM Diagnosis [phone] Other chronic osteomyelitis, right ankle and foot 08/28/2022 No Yes E11.621 Type 2 diabetes mellitus with foot ulcer 06/12/2022 No  Yes L97.412 Non-pressure chronic ulcer of right heel and midfoot with fat layer exposed 06/12/2022 No Yes I89.0 Lymphedema, not elsewhere classified 06/12/2022 No Yes Inactive Problems Resolved Problems Electronic Signature(s) Signed: 09/25/2022 10:19:27 AM By: Lenda Kelp PA-C Entered By: Lenda Kelp on 09/25/2022 10:19:27 -------------------------------------------------------------------------------- Progress Note Details Patient Name: Date of Service: [name], [name] 09/25/2022 9:30 A M Medical Record Number: [id number] Patient Account Number: [id number] Date of Birth/Sex: Treating RN: [birth year] (56 y.o. F) Primary Care Provider: [name] Other Clinician: Referring Provider: Treating Provider/Extender: [name] in Treatment: 15 Subjective Chief Complaint Information obtained from Patient Right heel ulcer History of Present Illness (HPI) 06-13-2022 upon evaluation today patient presents for evaluation of her right heel ulcer. She is having a tremendous amount of pain at this location. She tells me that her most recent hemoglobin A1c was 8.7 and that was on 08-23-2021. This ulcer on the heel has been present she states since February 2023 when she had an injection to the heel by podiatry. She states that she began to have increasing pain the wound opened and has been open ever since. 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 She had an x-ray in June if anything can although there did not appear to be any obvious [name], [name] ([id number]) [id number] Page 6 of 8 evidence of osteomyelitis at that point. She subsequently has had OR debridement several times of the wound performed by podiatry. Patient does have a history of lymphedema of the lower extremities she is also a type II diabetic. 06-19-2022 upon evaluation today patient appears to be doing better in regard to the size of her leg which is  significantly improved. With that being said she had a lot of drainage from the heel which is completely understandable considering what is going on at this time. There does not appear to be any evidence of active infection there is no warmth or irritation to the leg in general. With that being said the patient does have an issue here with the amount of drainage that is going on I definitely think Zetuvit is good to be the better way to go in place of ABD pads. Also think that she could potentially benefit from 3 times per week dressing changes but again right now we are going to stick with the to change it today, change in her Friday, and then subsequently seeing where things stand next Wednesday. The other option would be to change her to a different day for wound care having her come then say like a Tuesday Friday or Monday Thursday. 06-26-2022 upon evaluation patient's wound bed actually showed signs of doing well in regard  to the overall size it was measuring slightly smaller. With that being said unfortunately the biggest issue we see is she still has a tremendous amount of drainage which is what could keep this from being able to use a total contact cast. For that reason I am did going to discuss with her today the possibility of trying Tubigrip to see if that could be of benefit for her. She voiced understanding and is in agreement with giving this a try. 07-03-2022 upon evaluation today patient unfortunately appears to be doing significantly worse at this point in regard to her swelling due to the fact that she was unable to really wear the Tubigrip. She tells me that it was cutting into her. With that being said she also did have an MRI this MRI revealed that she does have osteomyelitis in the heel this was actually just performed Monday, 25 September. This does show signs of "early osteomyelitis". Nonetheless this is still concerning to me. The wound also appears to be getting deeper in the  center aspect of this which has me a little concerned as well. I do believe that she is likely going require an aggressive approach here to try to get things better. I discussed that in greater detail with her today. I will detailed in the plan. 07-17-2022 upon evaluation today patient appears to be doing poorly in regard to her wound. Unfortunately she does not show any signs of infection systemically though locally this seems to be doing worse with the wound actually being bigger than where it was previous. She did have an MRI that showed evidence of osteomyelitis actually recommended a referral to ID she was evaluated infectious disease and they recommended referral to podiatry and to be honest have recommended amputation based on what the patient tells me today. With that being said this is something that she is not interested in at all to be perfectly honest. For that reason she wants to know what she should do and where she should go at this point that is where the majority of the conversation which was quite lengthy today went. Obviously understand her concern here but I think she is going to have to definitely get off this and likely this means she is going to need to come out of work. 10/18; Since the patient was the patient is using Hydrofera Blue on her right heel wound. She has a new larger open area superior to the wound. The skin on the bottom of her foot is completely macerated. I reviewed the infectious disease note on this patient from 10/9. They recommended keeping the patient on Delofloxacin 450 twice daily until follow-up tomorrow. The patient states she is not going back there as the only thing they wanted to do was "cut my leg off]. She has not seen podiatry. Her lab works shows a CRP markedly elevated at 240.2 a sedimentation rate of 96 the rest of her CBC is normal creatinine of 0.38 The patient has been to see her primary doctor who is Dr. Leonides Schanz [[facility]] In [place]. According the  patient Dr. Loralee Pacas has ordered vancomycin and Zosyn to start on November 1. She is not taking the delofloxacin that was suggested by infectious disease. Very angry that they just wanted to consider her for an amputation although I do not specifically see that stated in their note 07-31-2022 upon evaluation today patient still has a significant wound over her plantar aspect of her right l foot region. With that being said I  am definitely concerned about the fact that the patient probably does need to be in hyperbaric oxygen therapy at this point. I think we should try to get this going as quickly as possible. She actually is going to be set up with a PICC line next Wednesday and then following this we will have the vancomycin and Zosyn that she will be taking for 8 weeks according to what she tells me. I think this is definitely going to be beneficial and again the goal here is limb salvage. In combination with that I think the hyperbaric oxygen therapy would be ultimately significantly helpful for her. 08-07-2022 upon evaluation today patient appears to still be doing quite poorly overall she still has not gotten her antibiotics. She got her PICC line today which I assumed she will be getting the first dose of antibiotics as well during that time. With that being said unfortunately she did not get the antibiotics and in fact as I questioned her further she does not even know when or if that is going to be started this week. Again I am very concerned about this considering she already has a PICC line in place we do not want this to clot off on top of the fact that she should already be on these antibiotics she just never went to the ER for evaluation therefore they never got this started. At this point based on what I am seeing I really think that the ideal thing would be for [place] to see about getting her to the ER for further evaluation and treatment to see if they can get this started for her as soon as  possible. The patient voiced understanding and is in agreement with the plan. I gave her recommendations for what should be done also called [facility] infusion therapy in order to see if they would be the ones that seem to be available for her IV infusions but they tell me that they no longer do this that is apparently where the orders were sent to for her infusion therapy. That was by her primary care provider. 08-21-2022 upon evaluation today patient's wound on the bottom of the heel and foot area as well as the toe appear to be doing significantly better. Fortunately I do not see any evidence of infection locally or systemically and everything is measuring much smaller than where we have been. The wound in the left gluteal region also is dramatically improved compared to last time she was here. She has started the antibiotics this is cefepime and vancomycin. She did have a somewhat high trough and therefore they have her hold that today and they are to recheck in the morning I believe she told me. Nonetheless with the antibiotics going she seems to be doing significantly better which is great news. 08-28-2022 upon evaluation today patient appears to be doing somewhat better in regard to her foot there is definitely less drainage than what we have seen in the past. I do believe the antibiotics are helping. With that being said she is also been approved for hyperbaric oxygen therapy and I think as soon as we get this started the better. I discussed that with her today as well. With that being said I do think that the last thing she needs is her chest x-ray when she has that done will be ready to start her into treatment. She is going to try to go get that today when she leaves which I think would be ideal. 09-11-2022 upon  evaluation today patient appears to be doing well with regard to her foot ulcer. This is showing signs of no digression and she does not appear to be having as much drainage as before  though it still very wet is not nearly the extreme of what it was previous. Fortunately there does not appear to be any signs of active infection locally nor systemically at this point which is great news. Patient did have her chest x-ray as well as EKG and she is actually approved and completely cleared for HBO therapy from both a clinical standpoint as well as an insurance standpoint. 09-25-2022 upon evaluation today patient appears to be doing well in regard to her foot ulcer. Unfortunately she is not doing as well when it comes to her hyperbaric treatments. She is having a lot of trouble with claustrophobia. She tells me in general that she is not sure she is good to be able to continue. Objective Constitutional Well-nourished and well-hydrated in no acute distress. [name], [name] ([id number]) [id number] Page 7 of 8 Vitals Time Taken: 9:31 AM, Height: 65 in, Weight: 331 lbs, BMI: 55.1, Temperature: 97.6 F, Pulse: 89 bpm, Respiratory Rate: 18 breaths/min, Blood Pressure: 138/83 mmHg. Respiratory normal breathing without difficulty. Psychiatric this patient is able to make decisions and demonstrates good insight into disease process. Alert and Oriented x 3. pleasant and cooperative. General Notes: Upon inspection patient's wound bed actually showed signs of good granulation epithelization at this point. Fortunately I do not see signs of active infection locally or systemically which is great news and overall I am extremely pleased any sign with where we stand currently. No fevers, chills, nausea, vomiting, or diarrhea. Integumentary (Hair, Skin) Wound #1 status is Open. Original cause of wound was Gradually Appeared. The date acquired was: 11/07/2020. The wound has been in treatment 15 weeks. The wound is located on the Right Calcaneus. The wound measures 8.8cm length x 2.5cm width x 0.4cm depth; 17.279cm^2 area and 6.912cm^3 volume. There is bone and Fat Layer  (Subcutaneous Tissue) exposed. There is no tunneling or undermining noted. There is a large amount of serosanguineous drainage noted. The wound margin is distinct with the outline attached to the wound base. There is large (67-100%) red, pink granulation within the wound bed. There is a small (1-33%) amount of necrotic tissue within the wound bed including Adherent Slough. The periwound skin appearance exhibited: Erythema. The periwound skin appearance did not exhibit: Callus, Crepitus, Excoriation, Induration, Rash, Scarring, Dry/Scaly, Maceration, Atrophie Blanche, Cyanosis, Ecchymosis, Hemosiderin Staining, Mottled, Pallor, Rubor. The surrounding wound skin color is noted with erythema which is circumferential. Periwound temperature was noted as No Abnormality. The periwound has tenderness on palpation. Assessment Active Problems ICD-10 Other chronic osteomyelitis, right ankle and foot Type 2 diabetes mellitus with foot ulcer Non-pressure chronic ulcer of right heel and midfoot with fat layer exposed Lymphedema, not elsewhere classified Plan Follow-up Appointments: Return Appointment in 2 weeks. - w/ Allen Derry, PA on WEdnesday's Other: - Ensure you continue oral antibiotics until PICC line place, then start and finish all IV antibiotics- Vancomycin and Zosyn (ordered primary care provider). Anesthetic: (In clinic) Topical Lidocaine 5% applied to wound bed Bathing/ Shower/ Hygiene: May shower with protection but do not get wound dressing(s) wet. Edema Control - Lymphedema / SCD / Other: Elevate legs to the level of the heart or above for 30 minutes daily and/or when sitting, a frequency of: - 3-4 times a day throughout the day. Avoid standing for long periods of time.  Off-Loading: Open toe surgical shoe to: - w/ peg assist Other: - Minimize walking and standing as much as possible to right foot to aid in offloading to wound. While at work use the wheelchair for mobility. Hyperbaric  Oxygen Therapy: Wound #1 Right Calcaneus: Evaluate for HBO Therapy Indication: - Wagner Grade 3 to right calcaneus If appropriate for treatment, begin HBOT per protocol: 2.0 ATA for 90 Minutes without Air Breaks - Pt. to restart at this rate and pressure if she decides to continue HBO. This will be for however many dives she has left. T Number of Treatments: - 40 otal One treatments per day (delivered Monday through Friday unless otherwise specified in Special Instructions below): Finger stick Blood Glucose Pre- and Post- HBOT Treatment. Follow Hyperbaric Oxygen Glycemia Protocol Afrin (Oxymetazoline HCL) 0.05% nasal spray - 1 spray in both nostrils daily as needed prior to HBO treatment for difficulty clearing ears WOUND #1: - Calcaneus Wound Laterality: Right Cleanser: Soap and Water 1 x Per Day/15 Days Discharge Instructions: May shower and wash wound with dial antibacterial soap and water prior to dressing change. Cleanser: Wound Cleanser (Generic) 1 x Per Day/15 Days Discharge Instructions: Cleanse the wound with wound cleanser prior to applying a clean dressing using gauze sponges, not tissue or cotton balls. Peri-Wound Care: Triamcinolone 15 (g) 1 x Per Day/15 Days Discharge Instructions: Use triamcinolone 15 (g) as directed Peri-Wound Care: Zinc Oxide Ointment 30g tube 1 x Per Day/15 Days Discharge Instructions: Apply Zinc Oxide to periwound with each dressing change Peri-Wound Care: Sween Lotion (Moisturizing lotion) 1 x Per Day/15 Days Discharge Instructions: Apply moisturizing lotion as directed Prim Dressing: KerraCel Ag Gelling Fiber Dressing, 4x5 in (silver alginate) (Generic) 1 x Per Day/15 Days ary Discharge Instructions: Apply silver alginate to wound bed as instructed Secondary Dressing: ABD Pad, 5x9 (Generic) 1 x Per Day/15 Days Discharge Instructions: Apply over primary dressing as directed. Secondary Dressing: Drawtex 4x4 in (Generic) 1 x Per Day/15 Days Discharge  Instructions: Apply over primary dressing as directed. Secondary Dressing: Woven Gauze Sponge, Non-Sterile 4x4 in (Generic) 1 x Per Day/15 Days [name], Adeja ([id number]) [id number] Page 8 of 8 Discharge Instructions: Apply over primary dressing as directed. Secured With: Elastic Bandage 4 inch (ACE bandage) (Generic) 1 x Per Day/15 Days Discharge Instructions: Secure with ACE bandage as directed. Secured With: [employer], 4.5x3.1 (in/yd) (Generic) 1 x Per Day/15 Days Discharge Instructions: Secure with Kerlix as directed. Secured With: 79M Medipore H Soft Cloth Surgical T ape, 4 x 10 (in/yd) (Generic) 1 x Per Day/15 Days Discharge Instructions: Secure with tape as directed. 1. I am going to recommend that we have the patient continue to monitor for any signs of infection or worsening. Obviously based on what I am seeing I feel like her foot is doing better although we are having trouble getting going with hyperbarics. 2. I am also can recommend the patient should continue to elevate her legs much as possible as well as staying off of this she tells me that she is doing a good job with this at work although she does continue to work full duty she mainly sits in a wheelchair in order to do the majority of what she needs to do throughout the night. We will see patient back for reevaluation in 2 weeks here in the clinic. If anything worsens or changes patient will contact our office for additional recommendations. Electronic Signature(s) Signed: 09/25/2022 3:15:56 PM By: Lenda Kelp PA-C Entered By: Linwood Dibbles,  Mary Secord on 09/25/2022 15:15:55 -------------------------------------------------------------------------------- SuperBill Details Patient Name: Date of Service: KAWANNA, CHRISTLEY 09/25/2022 Medical Record Number: [id number] Patient Account Number: [id number] Date of Birth/Sex: Treating RN: 1966/01/26 (56 y.o. [name], [name] Primary Care Provider:  [name] Other Clinician: Referring Provider: Treating Provider/Extender: [name] in Treatment: 15 Diagnosis Coding ICD-10 Codes Code Description [id number] Other chronic osteomyelitis, right ankle and foot E11.621 Type 2 diabetes mellitus with foot ulcer L97.412 Non-pressure chronic ulcer of right heel and midfoot with fat layer exposed I89.0 Lymphedema, not elsewhere classified Facility Procedures : CPT4 Code: [id number] Description: 99213 - WOUND CARE VISIT-LEV 3 EST PT Modifier: Quantity: 1 Physician Procedures : CPT4 Code Description Modifier [id number] - WC PHYS LEVEL 3 - EST PT ICD-10 Diagnosis Description M86.671 Other chronic osteomyelitis, right ankle and foot E11.621 Type 2 diabetes mellitus with foot ulcer L97.412 Non-pressure chronic ulcer of right  heel and midfoot with fat layer exposed I89.0 Lymphedema, not elsewhere classified Quantity: 1 Electronic Signature(s) Signed: 09/25/2022 3:16:18 PM By: Lenda Kelp PA-C Entered By: Lenda Kelp on 09/25/2022 15:16:18

## 2022-10-09 ENCOUNTER — Encounter (HOSPITAL_BASED_OUTPATIENT_CLINIC_OR_DEPARTMENT_OTHER): Payer: No Typology Code available for payment source | Attending: Physician Assistant | Admitting: Physician Assistant

## 2022-10-09 DIAGNOSIS — Z89421 Acquired absence of other right toe(s): Secondary | ICD-10-CM | POA: Insufficient documentation

## 2022-10-09 DIAGNOSIS — E1169 Type 2 diabetes mellitus with other specified complication: Secondary | ICD-10-CM | POA: Diagnosis not present

## 2022-10-09 DIAGNOSIS — E11621 Type 2 diabetes mellitus with foot ulcer: Secondary | ICD-10-CM | POA: Insufficient documentation

## 2022-10-09 DIAGNOSIS — L97412 Non-pressure chronic ulcer of right heel and midfoot with fat layer exposed: Secondary | ICD-10-CM | POA: Insufficient documentation

## 2022-10-09 DIAGNOSIS — I890 Lymphedema, not elsewhere classified: Secondary | ICD-10-CM | POA: Insufficient documentation

## 2022-10-09 DIAGNOSIS — M86671 Other chronic osteomyelitis, right ankle and foot: Secondary | ICD-10-CM | POA: Diagnosis not present

## 2022-10-09 NOTE — Progress Notes (Signed)
[name] ([id number]) [id number] Page 1 of 8 Visit Report for 10/09/2022 Chief Complaint Document Details Patient Name: Date of Service: [name], [name] 10/09/2022 10:30 A M Medical Record Number: [id number] Patient Account Number: [id number] Date of Birth/Sex: Treating RN: 1966-05-20 (57 y.o. F) Primary Care Provider: Stoney [name] Other Clinician: Referring Provider: Treating Provider/Extender: [name] in Treatment: 17 Information Obtained from: Patient Chief Complaint Right heel ulcer Electronic Signature(s) Signed: 10/09/2022 11:10:12 AM By: [name] Keeler PA-C Entered By: [name] on 10/09/2022 11:10:11 -------------------------------------------------------------------------------- HPI Details Patient Name: Date of Service: [name], [name] 10/09/2022 10:30 A M Medical Record Number: [id number] Patient Account Number: [id number] Date of Birth/Sex: Treating RN: 02-Aug-1966 (57 y.o. F) Primary Care Provider: Stoney [name] Other Clinician: Referring Provider: Treating Provider/Extender: [name] in Treatment: 17 History of Present Illness HPI Description: 06-13-2022 upon evaluation today patient presents for evaluation of her right heel ulcer. She is having a tremendous amount of pain at this location. She tells me that her most recent hemoglobin A1c was 8.7 and that was on 08-23-2021. This ulcer on the heel has been present she states since February 2023 when she had an injection to the heel by podiatry. She states that she began to have increasing pain the wound opened and has been open ever since. 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 She had an x-ray in June if anything can although there did not appear to be any obvious evidence of osteomyelitis at that point. She subsequently has had OR debridement several times of the wound performed by podiatry. Patient does have a  history of lymphedema of the lower extremities she is also a type II diabetic. 06-19-2022 upon evaluation today patient appears to be doing better in regard to the size of her leg which is significantly improved. With that being said she had a lot of drainage from the heel which is completely understandable considering what is going on at this time. There does not appear to be any evidence of active infection there is no warmth or irritation to the leg in general. With that being said the patient does have an issue here with the amount of drainage that is going on I definitely think Zetuvit is good to be the better way to go in place of ABD pads. Also think that she could potentially benefit from 3 times per week dressing changes but again right now we are going to stick with the to change it today, change in her Friday, and then subsequently seeing where things stand next Wednesday. The other option would be to change her to a different day for wound care having her come then say like a Tuesday Friday or Monday Thursday. 06-26-2022 upon evaluation patient's wound bed actually showed signs of doing well in regard to the overall size it was measuring slightly smaller. With that being said unfortunately the biggest issue we see is she still has a tremendous amount of drainage which is what could keep this from being able to use a total contact cast. For that reason I am did going to discuss with her today the possibility of trying Tubigrip to see if that could be of benefit for her. She voiced understanding and is in agreement with giving this a try. 07-03-2022 upon evaluation today patient unfortunately appears to be doing significantly worse at this point in regard to her swelling due to the fact that she was unable to really  wear the Tubigrip. She tells me that it was cutting into her. With that being said she also did have an MRI this MRI revealed that she does have osteomyelitis in the heel this was  actually just performed Monday, 25 September. This does show signs of "early osteomyelitis". Nonetheless this is still concerning to me. The wound also appears to be getting deeper in the center aspect of this which has me a little concerned as well. I do believe that she is likely going require an aggressive approach here to try to get things better. I discussed that in greater detail with her today. I will detailed in the plan. 07-17-2022 upon evaluation today patient appears to be doing poorly in regard to her wound. Unfortunately she does not show any signs of infection systemically though locally this seems to be doing worse with the wound actually being bigger than where it was previous. She did have an MRI that showed evidence of osteomyelitis actually recommended a referral to ID she was evaluated infectious disease and they recommended referral to podiatry and to be honest have recommended amputation based on what the patient tells me today. With that being said this is something that she is not interested in at all to be [name] ([id number]) [id number] Page 2 of 8 perfectly honest. For that reason she wants to know what she should do and where she should go at this point that is where the majority of the conversation which was quite lengthy today went. Obviously understand her concern here but I think she is going to have to definitely get off this and likely this means she is going to need to come out of work. 10/18; Since the patient was the patient is using Hydrofera Blue on her right heel wound. She has a new larger open area superior to the wound. The skin on the bottom of her foot is completely macerated. I reviewed the infectious disease note on this patient from 10/9. They recommended keeping the patient on Delofloxacin 450 twice daily until follow-up tomorrow. The patient states she is not going back there as the only thing they wanted to do was "cut  my leg off]. She has not seen podiatry. Her lab works shows a CRP markedly elevated at 240.2 a sedimentation rate of 96 the rest of her CBC is normal creatinine of 0.38 The patient has been to see her primary doctor who is Dr. Melonie Florida [SPo] In [place]. According the patient Dr. Mylinda Latina has ordered vancomycin and Zosyn to start on November 1. She is not taking the delofloxacin that was suggested by infectious disease. Very angry that they just wanted to consider her for an amputation although I do not specifically see that stated in their note 07-31-2022 upon evaluation today patient still has a significant wound over her plantar aspect of her right l foot region. With that being said I am definitely concerned about the fact that the patient probably does need to be in hyperbaric oxygen therapy at this point. I think we should try to get this going as quickly as possible. She actually is going to be set up with a PICC line next Wednesday and then following this we will have the vancomycin and Zosyn that she will be taking for 8 weeks according to what she tells me. I think this is definitely going to be beneficial and again the goal here is limb salvage. In combination with that I think the hyperbaric oxygen therapy would be ultimately significantly  helpful for her. 08-07-2022 upon evaluation today patient appears to still be doing quite poorly overall she still has not gotten her antibiotics. She got her PICC line today which I assumed she will be getting the first dose of antibiotics as well during that time. With that being said unfortunately she did not get the antibiotics and in fact as I questioned her further she does not even know when or if that is going to be started this week. Again I am very concerned about this considering she already has a PICC line in place we do not want this to clot off on top of the fact that she should already be on these antibiotics she just never went to the ER  for evaluation therefore they never got this started. At this point based on what I am seeing I really think that the ideal thing would be for [place] to see about getting her to the ER for further evaluation and treatment to see if they can get this started for her as soon as possible. The patient voiced understanding and is in agreement with the plan. I gave her recommendations for what should be done also called [facility] infusion therapy in order to see if they would be the ones that seem to be available for her IV infusions but they tell me that they no longer do this that is apparently where the orders were sent to for her infusion therapy. That was by her primary care provider. 08-21-2022 upon evaluation today patient's wound on the bottom of the heel and foot area as well as the toe appear to be doing significantly better. Fortunately I do not see any evidence of infection locally or systemically and everything is measuring much smaller than where we have been. The wound in the left gluteal region also is dramatically improved compared to last time she was here. She has started the antibiotics this is cefepime and vancomycin. She did have a somewhat high trough and therefore they have her hold that today and they are to recheck in the morning I believe she told me. Nonetheless with the antibiotics going she seems to be doing significantly better which is great news. 08-28-2022 upon evaluation today patient appears to be doing somewhat better in regard to her foot there is definitely less drainage than what we have seen in the past. I do believe the antibiotics are helping. With that being said she is also been approved for hyperbaric oxygen therapy and I think as soon as we get this started the better. I discussed that with her today as well. With that being said I do think that the last thing she needs is her chest x-ray when she has that done will be ready to start her into treatment. She is going to  try to go get that today when she leaves which I think would be ideal. 09-11-2022 upon evaluation today patient appears to be doing well with regard to her foot ulcer. This is showing signs of no digression and she does not appear to be having as much drainage as before though it still very wet is not nearly the extreme of what it was previous. Fortunately there does not appear to be any signs of active infection locally nor systemically at this point which is great news. Patient did have her chest x-ray as well as EKG and she is actually approved and completely cleared for HBO therapy from both a clinical standpoint as well as an insurance standpoint.  09-25-2022 upon evaluation today patient appears to be doing well in regard to her foot ulcer. Unfortunately she is not doing as well when it comes to her hyperbaric treatments. She is having a lot of trouble with claustrophobia. She tells me in general that she is not sure she is good to be able to continue. 10-09-2022 upon evaluation today patient's wounds actually are showing signs of excellent improvement which is great news. Fortunately I do not see any evidence of infection locally or systemically which is great news and overall I am extremely pleased with where things are currently. I do think that she is making good progress she does tell me at this point today she does not want to go forward with a hyperbaric oxygen therapy. Electronic Signature(s) Signed: 10/09/2022 1:17:19 PM By: [name] Keeler PA-C Entered By: [name] on 10/09/2022 13:17:19 -------------------------------------------------------------------------------- Physical Exam Details Patient Name: Date of Service: [name], [name] 10/09/2022 10:30 A M Medical Record Number: [id number] Patient Account Number: [id number] Date of Birth/Sex: Treating RN: 09/28/1966 (57 y.o. F) Primary Care Provider: Stoney [name] Other Clinician: Referring Provider: Treating Provider/Extender: [name] in Treatment: 17 Constitutional Chronically ill appearing but in no apparent acute distress. Respiratory normal breathing without difficulty. Psychiatric [place]ico, Kortlynn ([id number]) [id number] Page 3 of 8 this patient is able to make decisions and demonstrates good insight into disease process. Alert and Oriented x 3. pleasant and cooperative. Notes Upon inspection patient's wound bed actually showed signs of excellent granulation epithelization at this point. The wounds are actually measuring smaller although they are separated so the overall wound size is not as much of a change as what the visual shows there is a lot of epithelial tissue in between the 2 actual openings. In general I think she is doing well and keeping off of this which is great news. Electronic Signature(s) Signed: 10/09/2022 1:17:45 PM By: [name] Keeler PA-C Entered By: [name] on 10/09/2022 13:17:44 -------------------------------------------------------------------------------- Physician Orders Details Patient Name: Date of Service: [name], [name] 10/09/2022 10:30 A M Medical Record Number: [id number] Patient Account Number: [id number] Date of Birth/Sex: Treating RN: [birth year] (57 y.o. [name], [name] Primary Care Provider: Stoney [name] Other Clinician: Referring Provider: Treating Provider/Extender: [name] in Treatment: [phone] Verbal / Phone Orders: No Diagnosis Coding ICD-10 Coding Code Description [phone] Other chronic osteomyelitis, right ankle and foot E11.621 Type 2 diabetes mellitus with foot ulcer L97.412 Non-pressure chronic ulcer of right heel and midfoot with fat layer exposed I89.0 Lymphedema, not elsewhere classified Follow-up Appointments ppointment in 1 week. - Lanae Crumbly on Wednesday 01/10-24 @ 1030 Rm # 8 Return A Other: - Ensure you continue oral antibiotics until PICC line place, then start and  finish all IV antibiotics- Vancomycin and Zosyn (ordered primary care provider). PICC line out- pt. will ask PCP about oral antibiotics. Be on the lookout for increased drainage, redness, swelling, pain, odor of right foot. These are s/s of infection and you will need to call us or PCP to let someone know. Anesthetic (In clinic) Topical Lidocaine 5% applied to wound bed Edema Control - Lymphedema / SCD / Other Avoid standing for long periods of time. Off-Loading Open toe surgical shoe to: - w/ peg assist Other: - Minimize walking and standing as much as possible to right foot to aid in offloading to wound. While at work use the wheelchair for mobility. Wound Treatment Wound #1 - Calcaneus Wound Laterality: Right Cleanser: Soap  and Water 1 x Per [id number] Days Discharge Instructions: May shower and wash wound with dial antibacterial soap and water prior to dressing change. Cleanser: Wound Cleanser (Generic) 1 x Per Day/15 Days Discharge Instructions: Cleanse the wound with wound cleanser prior to applying a clean dressing using gauze sponges, not tissue or cotton balls. Peri-Wound Care: Triamcinolone 15 (g) 1 x Per Day/15 Days Discharge Instructions: Use triamcinolone 15 (g) as directed Peri-Wound Care: Zinc Oxide Ointment 30g tube 1 x Per Day/15 Days Discharge Instructions: Apply Zinc Oxide to periwound with each dressing change Peri-Wound Care: Sween Lotion (Moisturizing lotion) 1 x Per [id number] Days Discharge Instructions: Apply moisturizing lotion as directed Prim Dressing: KerraCel Ag Gelling Fiber Dressing, 4x5 in (silver alginate) (Generic) 1 x Per Day/15 Days ary Schoening, Raenell ([id number]) [id number] Page 4 of 8 Discharge Instructions: Apply silver alginate to wound bed as instructed Secondary Dressing: ABD Pad, 5x9 (Generic) 1 x Per Day/15 Days Discharge Instructions: Apply over primary dressing as directed. Secondary Dressing: Drawtex 4x4 in (Generic) 1 x  Per Day/15 Days Discharge Instructions: Apply over primary dressing as directed. Secondary Dressing: Woven Gauze Sponge, Non-Sterile 4x4 in (Generic) 1 x Per Day/15 Days Discharge Instructions: Apply over primary dressing as directed. Secured With: Elastic Bandage 4 inch (ACE bandage) (Generic) 1 x Per Day/15 Days Discharge Instructions: Secure with ACE bandage as directed. Secured With: [employer], 4.5x3.1 (in/yd) (Generic) 1 x Per Day/15 Days Discharge Instructions: Secure with Kerlix as directed. Secured With: 104M Medipore H Soft Cloth Surgical T ape, 4 x 10 (in/yd) (Generic) 1 x Per Day/15 Days Discharge Instructions: Secure with tape as directed. Electronic Signature(s) Signed: 10/09/2022 4:16:28 PM By: [name] Keeler PA-C Signed: 10/09/2022 5:35:13 PM By: Rhae Hammock RN Entered By: Rhae Hammock on 10/09/2022 11:42:40 -------------------------------------------------------------------------------- Problem List Details Patient Name: Date of Service: [name], [name] 10/09/2022 10:30 A M Medical Record Number: [id number] Patient Account Number: [id number] Date of Birth/Sex: Treating RN: 10/15/1965 (56 y.o. F) Primary Care Provider: Stoney [name] Other Clinician: Referring Provider: Treating Provider/Extender: [name] in Treatment: 17 Active Problems ICD-10 Encounter Code Description Active Date MDM Diagnosis [phone] Other chronic osteomyelitis, right ankle and foot 08/28/2022 No Yes E11.621 Type 2 diabetes mellitus with foot ulcer 06/12/2022 No Yes L97.412 Non-pressure chronic ulcer of right heel and midfoot with fat layer exposed 06/12/2022 No Yes I89.0 Lymphedema, not elsewhere classified 06/12/2022 No Yes Inactive Problems Resolved Problems Electronic Signature(s) Signed: 10/09/2022 11:10:05 AM By: [name] Keeler PA-C Entered By: [name] on 10/09/2022 11:10:05 [name], [name] ([id number]) [id number] Page 5 of  8 -------------------------------------------------------------------------------- Progress Note Details Patient Name: Date of Service: [name], [name] 10/09/2022 10:30 A M Medical Record Number: [id number] Patient Account Number: [id number] Date of Birth/Sex: Treating RN: 1966/01/04 (57 y.o. F) Primary Care Provider: Stoney [name] Other Clinician: Referring Provider: Treating Provider/Extender: [name] in Treatment: 17 Subjective Chief Complaint Information obtained from Patient Right heel ulcer History of Present Illness (HPI) 06-13-2022 upon evaluation today patient presents for evaluation of her right heel ulcer. She is having a tremendous amount of pain at this location. She tells me that her most recent hemoglobin A1c was 8.7 and that was on 08-23-2021. This ulcer on the heel has been present she states since February 2023 when she had an injection to the heel by podiatry. She states that she began to have increasing pain the wound opened and has been open ever since. She has previously undergone a right  second toe amputation April 2022. She had an x-ray in June if anything can although there did not appear to be any obvious evidence of osteomyelitis at that point. She subsequently has had OR debridement several times of the wound performed by podiatry. Patient does have a history of lymphedema of the lower extremities she is also a type II diabetic. 06-19-2022 upon evaluation today patient appears to be doing better in regard to the size of her leg which is significantly improved. With that being said she had a lot of drainage from the heel which is completely understandable considering what is going on at this time. There does not appear to be any evidence of active infection there is no warmth or irritation to the leg in general. With that being said the patient does have an issue here with the amount of drainage that is going on I definitely think Zetuvit is good  to be the better way to go in place of ABD pads. Also think that she could potentially benefit from 3 times per week dressing changes but again right now we are going to stick with the to change it today, change in her Friday, and then subsequently seeing where things stand next Wednesday. The other option would be to change her to a different day for wound care having her come then say like a Tuesday Friday or Monday Thursday. 06-26-2022 upon evaluation patient's wound bed actually showed signs of doing well in regard to the overall size it was measuring slightly smaller. With that being said unfortunately the biggest issue we see is she still has a tremendous amount of drainage which is what could keep this from being able to use a total contact cast. For that reason I am did going to discuss with her today the possibility of trying Tubigrip to see if that could be of benefit for her. She voiced understanding and is in agreement with giving this a try. 07-03-2022 upon evaluation today patient unfortunately appears to be doing significantly worse at this point in regard to her swelling due to the fact that she was unable to really wear the Tubigrip. She tells me that it was cutting into her. With that being said she also did have an MRI this MRI revealed that she does have osteomyelitis in the heel this was actually just performed Monday, 25 September. This does show signs of "early osteomyelitis". Nonetheless this is still concerning to me. The wound also appears to be getting deeper in the center aspect of this which has me a little concerned as well. I do believe that she is likely going require an aggressive approach here to try to get things better. I discussed that in greater detail with her today. I will detailed in the plan. 07-17-2022 upon evaluation today patient appears to be doing poorly in regard to her wound. Unfortunately she does not show any signs of infection systemically though  locally this seems to be doing worse with the wound actually being bigger than where it was previous. She did have an MRI that showed evidence of osteomyelitis actually recommended a referral to ID she was evaluated infectious disease and they recommended referral to podiatry and to be honest have recommended amputation based on what the patient tells me today. With that being said this is something that she is not interested in at all to be perfectly honest. For that reason she wants to know what she should do and where she should go at this point  that is where the majority of the conversation which was quite lengthy today went. Obviously understand her concern here but I think she is going to have to definitely get off this and likely this means she is going to need to come out of work. 10/18; Since the patient was the patient is using Hydrofera Blue on her right heel wound. She has a new larger open area superior to the wound. The skin on the bottom of her foot is completely macerated. I reviewed the infectious disease note on this patient from 10/9. They recommended keeping the patient on Delofloxacin 450 twice daily until follow-up tomorrow. The patient states she is not going back there as the only thing they wanted to do was "cut my leg off]. She has not seen podiatry. Her lab works shows a CRP markedly elevated at 240.2 a sedimentation rate of 96 the rest of her CBC is normal creatinine of 0.38 The patient has been to see her primary doctor who is Dr. Melonie [place] [SPo] In [place]. According the patient Dr. Mylinda Latina has ordered vancomycin and Zosyn to start on November 1. She is not taking the delofloxacin that was suggested by infectious disease. Very angry that they just wanted to consider her for an amputation although I do not specifically see that stated in their note 07-31-2022 upon evaluation today patient still has a significant wound over her plantar aspect of her right l foot region. With that  being said I am definitely concerned about the fact that the patient probably does need to be in hyperbaric oxygen therapy at this point. I think we should try to get this going as quickly as possible. She actually is going to be set up with a PICC line next Wednesday and then following this we will have the vancomycin and Zosyn that she will be taking for 8 weeks according to what she tells me. I think this is definitely going to be beneficial and again the goal here is limb salvage. In combination with that I think the hyperbaric oxygen therapy would be ultimately significantly helpful for her. 08-07-2022 upon evaluation today patient appears to still be doing quite poorly overall she still has not gotten her antibiotics. She got her PICC line today which I assumed she will be getting the first dose of antibiotics as well during that time. With that being said unfortunately she did not get the antibiotics and in fact as I questioned her further she does not even know when or if that is going to be started this week. Again I am very concerned about this considering she already has a PICC line in place we do not want this to clot off on top of the fact that she should already be on these antibiotics she just never went to the ER for evaluation therefore they never got this started. At this point based on what I am seeing I really think that the ideal thing would be for [place] to see about getting her to the ER for further evaluation and treatment to see if they can get this started for her as soon as possible. The patient voiced understanding and is in agreement with the plan. I gave her recommendations for what should be done also called [facility] infusion therapy in order to see if they would be the ones that seem to be available for her IV infusions but they tell me that they no longer do this that is apparently where the orders were sent to for  her infusion therapy. That was by her primary care  provider. [name], [name] AspINDY ([id number]) [id number] Page 6 of 8 08-21-2022 upon evaluation today patient's wound on the bottom of the heel and foot area as well as the toe appear to be doing significantly better. Fortunately I do not see any evidence of infection locally or systemically and everything is measuring much smaller than where we have been. The wound in the left gluteal region also is dramatically improved compared to last time she was here. She has started the antibiotics this is cefepime and vancomycin. She did have a somewhat high trough and therefore they have her hold that today and they are to recheck in the morning I believe she told me. Nonetheless with the antibiotics going she seems to be doing significantly better which is great news. 08-28-2022 upon evaluation today patient appears to be doing somewhat better in regard to her foot there is definitely less drainage than what we have seen in the past. I do believe the antibiotics are helping. With that being said she is also been approved for hyperbaric oxygen therapy and I think as soon as we get this started the better. I discussed that with her today as well. With that being said I do think that the last thing she needs is her chest x-ray when she has that done will be ready to start her into treatment. She is going to try to go get that today when she leaves which I think would be ideal. 09-11-2022 upon evaluation today patient appears to be doing well with regard to her foot ulcer. This is showing signs of no digression and she does not appear to be having as much drainage as before though it still very wet is not nearly the extreme of what it was previous. Fortunately there does not appear to be any signs of active infection locally nor systemically at this point which is great news. Patient did have her chest x-ray as well as EKG and she is actually approved and completely cleared for HBO therapy from  both a clinical standpoint as well as an insurance standpoint. 09-25-2022 upon evaluation today patient appears to be doing well in regard to her foot ulcer. Unfortunately she is not doing as well when it comes to her hyperbaric treatments. She is having a lot of trouble with claustrophobia. She tells me in general that she is not sure she is good to be able to continue. 10-09-2022 upon evaluation today patient's wounds actually are showing signs of excellent improvement which is great news. Fortunately I do not see any evidence of infection locally or systemically which is great news and overall I am extremely pleased with where things are currently. I do think that she is making good progress she does tell me at this point today she does not want to go forward with a hyperbaric oxygen therapy. Objective Constitutional Chronically ill appearing but in no apparent acute distress. Vitals Time Taken: 11:12 AM, Height: 65 in, Weight: 331 lbs, BMI: 55.1, Temperature: 97.7 F, Pulse: 81 bpm, Respiratory Rate: 18 breaths/min, Blood Pressure: 136/85 mmHg. Respiratory normal breathing without difficulty. Psychiatric this patient is able to make decisions and demonstrates good insight into disease process. Alert and Oriented x 3. pleasant and cooperative. General Notes: Upon inspection patient's wound bed actually showed signs of excellent granulation epithelization at this point. The wounds are actually measuring smaller although they are separated so the overall wound size is not as much of a change as  what the visual shows there is a lot of epithelial tissue in between the 2 actual openings. In general I think she is doing well and keeping off of this which is great news. Integumentary (Hair, Skin) Wound #1 status is Open. Original cause of wound was Gradually Appeared. The date acquired was: 11/07/2020. The wound has been in treatment 17 weeks. The wound is located on the Right Calcaneus. The wound  measures 8cm length x 3.5cm width x 0.3cm depth; 21.991cm^2 area and 6.597cm^3 volume. There is bone and Fat Layer (Subcutaneous Tissue) exposed. There is no tunneling or undermining noted. There is a large amount of serosanguineous drainage noted. The wound margin is distinct with the outline attached to the wound base. There is large (67-100%) red, pink granulation within the wound bed. There is a small (1-33%) amount of necrotic tissue within the wound bed including Adherent Slough. The periwound skin appearance did not exhibit: Callus, Crepitus, Excoriation, Induration, Rash, Scarring, Dry/Scaly, Maceration, Atrophie Blanche, Cyanosis, Ecchymosis, Hemosiderin Staining, Mottled, Pallor, Rubor, Erythema. Periwound temperature was noted as No Abnormality. The periwound has tenderness on palpation. Assessment Active Problems ICD-10 Other chronic osteomyelitis, right ankle and foot Type 2 diabetes mellitus with foot ulcer Non-pressure chronic ulcer of right heel and midfoot with fat layer exposed Lymphedema, not elsewhere classified Plan Follow-up Appointments: Return Appointment in 1 week. - Lanae Crumbly on Wednesday 01/10-24 @ 1030 Rm # 8 Other: - Ensure you continue oral antibiotics until PICC line place, then start and finish all IV antibiotics- Vancomycin and Zosyn (ordered primary care provider). PICC line out- pt. will ask PCP about oral antibiotics. Be on the lookout for increased drainage, redness, swelling, pain, odor of right foot. These are s/s of infection and you will need to call us or PCP to let someone know. Anesthetic: (In clinic) Topical Lidocaine 5% applied to wound bed [name], [name] ([id number]) [id number] Page 7 of 8 Edema Control - Lymphedema / SCD / Other: Avoid standing for long periods of time. Off-Loading: Open toe surgical shoe to: - w/ peg assist Other: - Minimize walking and standing as much as possible to right foot to aid in  offloading to wound. While at work use the wheelchair for mobility. WOUND #1: - Calcaneus Wound Laterality: Right Cleanser: Soap and Water 1 x Per [id number] Days Discharge Instructions: May shower and wash wound with dial antibacterial soap and water prior to dressing change. Cleanser: Wound Cleanser (Generic) 1 x Per Day/15 Days Discharge Instructions: Cleanse the wound with wound cleanser prior to applying a clean dressing using gauze sponges, not tissue or cotton balls. Peri-Wound Care: Triamcinolone 15 (g) 1 x Per Day/15 Days Discharge Instructions: Use triamcinolone 15 (g) as directed Peri-Wound Care: Zinc Oxide Ointment 30g tube 1 x Per Day/15 Days Discharge Instructions: Apply Zinc Oxide to periwound with each dressing change Peri-Wound Care: Sween Lotion (Moisturizing lotion) 1 x Per Day/15 Days Discharge Instructions: Apply moisturizing lotion as directed Prim Dressing: KerraCel Ag Gelling Fiber Dressing, 4x5 in (silver alginate) (Generic) 1 x Per Day/15 Days ary Discharge Instructions: Apply silver alginate to wound bed as instructed Secondary Dressing: ABD Pad, 5x9 (Generic) 1 x Per Day/15 Days Discharge Instructions: Apply over primary dressing as directed. Secondary Dressing: Drawtex 4x4 in (Generic) 1 x Per Day/15 Days Discharge Instructions: Apply over primary dressing as directed. Secondary Dressing: Woven Gauze Sponge, Non-Sterile 4x4 in (Generic) 1 x Per Day/15 Days Discharge Instructions: Apply over primary dressing as directed. Secured With: Elastic Bandage 4 inch (ACE  bandage) (Generic) 1 x Per Day/15 Days Discharge Instructions: Secure with ACE bandage as directed. Secured With: [employer], 4.5x3.1 (in/yd) (Generic) 1 x Per Day/15 Days Discharge Instructions: Secure with Kerlix as directed. Secured With: 34M Medipore H Soft Cloth Surgical T ape, 4 x 10 (in/yd) (Generic) 1 x Per Day/15 Days Discharge Instructions: Secure with tape as directed. 1. I am going to  recommend that we have the patient continue to monitor for any signs of infection or worsening. Office if anything changes she knows contact the office let me know. 2. I would also suggest that the patient continue to elevate her leg is much as possible to help with edema control honestly that I think she is doing quite well she is done with her IV antibiotics at this point. She is going to contact her doctor to see if they recommend any additional oral or if she is good to just discontinue at this point. 3. I am also going to suggest patient should continue to monitor for any signs of worsening from an infection standpoint we discussed options or rather signs and symptoms today that she would be looking out for. We will see patient back for reevaluation in 1 week here in the clinic. If anything worsens or changes patient will contact our office for additional recommendations. Electronic Signature(s) Signed: 10/09/2022 1:18:35 PM By: [name] Keeler PA-C Entered By: [name] on 10/09/2022 13:18:35 -------------------------------------------------------------------------------- SuperBill Details Patient Name: Date of Service: [name], [name] 10/09/2022 Medical Record Number: [id number] Patient Account Number: [id number] Date of Birth/Sex: Treating RN: 06/24/1966 (57 y.o. F) Primary Care Provider: Stoney [name] Other Clinician: Referring Provider: Treating Provider/Extender: [name] in Treatment: 17 Diagnosis Coding ICD-10 Codes Code Description [phone] Other chronic osteomyelitis, right ankle and foot E11.621 Type 2 diabetes mellitus with foot ulcer L97.412 Non-pressure chronic ulcer of right heel and midfoot with fat layer exposed I89.0 Lymphedema, not elsewhere classified Facility Procedures Physician Procedures : CPT4 Code Description Modifier [id number] - WC PHYS LEVEL 3 - EST PT ICD-10 Diagnosis Description M86.671 Other chronic osteomyelitis, right  ankle and foot E11.621 Type 2 diabetes mellitus with foot ulcer L97.412 Non-pressure chronic ulcer of right  heel and midfoot with fat layer exposed I89.0 Lymphedema, not elsewhere classified Quantity: 1 Electronic Signature(s) Signed: 10/09/2022 4:16:28 PM By: [name] Keeler PA-C Signed: 10/09/2022 5:35:13 PM By: Rhae Hammock RN Previous Signature: 10/09/2022 1:20:52 PM Version By: [name] Keeler PA-C Entered By: Rhae Hammock on 10/09/2022 16:04:33

## 2022-10-10 ENCOUNTER — Telehealth: Payer: Self-pay | Admitting: *Deleted

## 2022-10-10 NOTE — Progress Notes (Signed)
[place], [name] ([id number]) [id number] Page 1 of 8 Visit Report for 06/12/2022 Allergy List Details Patient Name: Date of Service: [name], [name] 06/12/2022 8:00 A M Medical Record Number: [id number] Patient Account Number: [id number] Date of Birth/Sex: Treating RN: May 15, 1966 (57 y.o. [name], [name] Primary Care Tanara Turvey: [name] Other Clinician: Referring Maraki Macquarrie: Treating Nataley Bahri/Extender: [name] Weeks in Treatment: 0 Allergies Active Allergies No Known Allergies Allergy Notes Electronic Signature(s) Signed: 10/09/2022 5:37:22 PM By: Fonnie Mu RN Entered By: Fonnie Mu on 06/12/2022 08:02:32 -------------------------------------------------------------------------------- Arrival Information Details Patient Name: Date of Service: KRISTLE, WESCH 06/12/2022 8:00 A M Medical Record Number: [id number] Patient Account Number: [id number] Date of Birth/Sex: Treating RN: December 10, 1965 (57 y.o. [name], [name] Primary Care Dryden Tapley: [name] Other Clinician: Referring Analeigha Nauman: Treating Lanette Ell/Extender: [name] in Treatment: 0 Visit Information Patient Arrived: Ambulatory Arrival Time: 08:09 Accompanied By: son Transfer Assistance: None Patient Identification Verified: Yes Secondary Verification Process Completed: Yes Patient Requires Transmission-Based Precautions: No Patient Has Alerts: No Electronic Signature(s) Signed: 10/09/2022 5:37:22 PM By: Fonnie Mu RN Entered By: Fonnie Mu on 06/12/2022 08:09:36 -------------------------------------------------------------------------------- Clinic Level of Care Assessment Details Patient Name: Date of Service: [name], [name] 06/12/2022 8:00 A M Medical Record Number: [id number] Patient Account Number: [id number] [name], [name] ([id number]) [phone].pdf Page 2 of 8 Date of Birth/Sex: Treating RN: 09-26-1966  (57 y.o. [name], [name] Primary Care Suprina Mandeville: Other Clinician: [name] Referring Shirell Struthers: Treating Voris Tigert/Extender: [name] in Treatment: 0 Clinic Level of Care Assessment Items TOOL 3 Quantity Score X- 1 0 Use when EandM and Procedure is performed on FOLLOW-UP visit ASSESSMENTS - Nursing Assessment / Reassessment X- 1 10 Reassessment of Co-morbidities (includes updates in patient status) X- 1 5 Reassessment of Adherence to Treatment Plan ASSESSMENTS - Wound and Skin Assessment / Reassessment []  - Points for Wound Assessment can only be taken for a new wound of unknown or different etiology and a procedure is 0 NOT performed to that wound X- 1 5 Simple Wound Assessment / Reassessment - one wound []  - 0 Complex Wound Assessment / Reassessment - multiple wounds []  - 0 Dermatologic / Skin Assessment (not related to wound area) ASSESSMENTS - Focused Assessment X- 1 5 Circumferential Edema Measurements - multi extremities []  - 0 Nutritional Assessment / Counseling / Intervention []  - 0 Lower Extremity Assessment (monofilament, tuning fork, pulses) []  - 0 Peripheral Arterial Disease Assessment (using hand held doppler) ASSESSMENTS - Ostomy and/or Continence Assessment and Care []  - 0 Incontinence Assessment and Management []  - 0 Ostomy Care Assessment and Management (repouching, etc.) PROCESS - Coordination of Care []  - Points for Discharge Coordination can only be taken for a new wound of unknown or different etiology and a 0 procedure is NOT performed to that wound []  - 0 Simple Patient / Family Education for ongoing care X- 1 20 Complex (extensive) Patient / Family Education for ongoing care X- 1 10 Staff obtains , Records, T Results / Process Orders est []  - 0 Staff telephones HHA, Nursing Homes / Clarify orders / etc []  - 0 Routine Transfer to another Facility (non-emergent condition) []  - 0 Routine Hospital  Admission (non-emergent condition) X- 1 15 New Admissions / / Ordering NPWT Apligraf, etc. , []  - 0 Emergency Hospital Admission (emergent condition) []  - 0 Simple Discharge Coordination X- 1 15 Complex (extensive) Discharge Coordination PROCESS - Special Needs []  - 0 Pediatric / Minor Patient Management []  - 0 Isolation  Patient Management []  - 0 Hearing / Language / Visual special needs []  - 0 Assessment of Community assistance (transportation, D/C planning, etc.) []  - 0 Additional assistance / Altered mentation []  - 0 Support Surface(s) Assessment (bed, cushion, seat, etc.) INTERVENTIONS - Wound Cleansing / Measurement []  - Points for Wound Cleaning / Measurement, Wound Dressing, Specimen Collection and Specimen taken to lab can 0 only be taken for a new wound of unknown or different etiology and a procedure is NOT performed to that wound X- 1 5 Simple Wound Cleansing - one wound [name], [name] ([id number]) [id number] Page 3 of 8 []  - 0 Complex Wound Cleansing - multiple wounds X- 1 5 Wound Imaging (photographs - any number of wounds) []  - 0 Wound Tracing (instead of photographs) X- 1 5 Simple Wound Measurement - one wound []  - 0 Complex Wound Measurement - multiple wounds INTERVENTIONS - Wound Dressings []  - 0 Small Wound Dressing one or multiple wounds X- 1 15 Medium Wound Dressing one or multiple wounds []  - 0 Large Wound Dressing one or multiple wounds INTERVENTIONS - Miscellaneous []  - 0 External ear exam []  - 0 Specimen Collection (cultures, biopsies, blood, body fluids, etc.) []  - 0 Specimen(s) / Culture(s) sent or taken to Lab for analysis []  - 0 Patient Transfer (multiple staff / Harrel Lemon Lift / Similar devices) []  - 0 Simple Staple / Suture removal (25 or less) []  - 0 Complex Staple / Suture removal (26 or more) []  - 0 Hypo / Hyperglycemic Management (close monitor of Blood Glucose) X- 1 15 Ankle  / Brachial Index (ABI) - do not check if billed separately X- 1 5 Vital Signs Has the patient been seen at the hospital within the last three years: Yes Total Score: 135 Level Of Care: New/Established - Level 4 Electronic Signature(s) Signed: 10/09/2022 5:37:22 PM By: Rhae Hammock RN Entered By: Rhae Hammock on 06/12/2022 09:37:58 -------------------------------------------------------------------------------- Compression Therapy Details Patient Name: Date of Service: [name], [name] 06/12/2022 8:00 A M Medical Record Number: [id number] Patient Account Number: [id number] Date of Birth/Sex: Treating RN: 10-26-1965 (57 y.o. [name], [name] Primary Care Citlalic Norlander: Stoney Bang Other Clinician: Referring Cynthia Cogle: Treating Jesscia Imm/Extender: Dallas Breeding in Treatment: 0 Compression Therapy Performed for Wound Assessment: Wound #1 Right Calcaneus Performed By: Clinician Rhae Hammock, RN Compression Type: Four Layer Post Procedure Diagnosis Same as Pre-procedure Electronic Signature(s) Signed: 10/09/2022 5:37:22 PM By: Rhae Hammock RN Entered By: Rhae Hammock on 06/12/2022 09:18:38 Klees, Jaziyah ([id number]) [id number] Page 4 of 8 -------------------------------------------------------------------------------- Encounter Discharge Information Details Patient Name: Date of Service: [name], [name] 06/12/2022 8:00 A M Medical Record Number: [id number] Patient Account Number: [id number] Date of Birth/Sex: Treating RN: [birth year] (56 y.o. [name], [name] Primary Care Madyn Ivins: Stoney Bang Other Clinician: Referring Elchanan Bob: Treating Latrelle Bazar/Extender: Dallas Breeding in Treatment: 0 Encounter Discharge Information Items Post Procedure Vitals Discharge Condition: Stable Temperature (F): 98.7 Ambulatory Status: Ambulatory Pulse (bpm): 74 Discharge Destination: Home Respiratory Rate (breaths/min):  17 Transportation: Private Auto Blood Pressure (mmHg): 140/74 Accompanied By: SON Schedule Follow-up Appointment: Yes Clinical Summary of Care: Patient Declined Electronic Signature(s) Signed: 10/09/2022 5:37:22 PM By: Rhae Hammock RN Entered By: Rhae Hammock on 06/12/2022 09:43:02 -------------------------------------------------------------------------------- Lower Extremity Assessment Details Patient Name: Date of Service: [name], [name] 06/12/2022 8:00 A M Medical Record Number: [id number] Patient Account Number: [id number] Date of Birth/Sex: Treating RN: July 23, 1966 (57 y.o. [name], [name] Primary Care Conni Knighton: Stoney Bang Other Clinician: Referring Teiara Baria: Treating Mckaela Howley/Extender: Judye Bos,  Velvet Bathe Weeks in Treatment: 0 Edema Assessment Assessed: [Left: No] [Right: Yes] Edema: [Left: Ye] [Right: s] Calf Left: Right: Point of Measurement: 32 cm From Medial Instep 63 cm Ankle Left: Right: Point of Measurement: 10 cm From Medial Instep 34.5 cm Vascular Assessment Pulses: Dorsalis Pedis Palpable: [Right:Yes] Posterior Tibial Palpable: [Right:Yes] Blood Pressure: Brachial: [Right:129] Ankle: [Right:Dorsalis Pedis: 160 1.24] Electronic Signature(s) Signed: 10/09/2022 5:37:22 PM By: Rhae Hammock RN [name], [name] ([id number]) [phone].pdf Page 5 of 8 Signed: 10/09/2022 5:37:22 PM By: Rhae Hammock RN Entered By: Rhae Hammock on 06/12/2022 08:34:34 -------------------------------------------------------------------------------- Multi-Disciplinary Care Plan Details Patient Name: Date of Service: [name], [name] 06/12/2022 8:00 A M Medical Record Number: [id number] Patient Account Number: [id number] Date of Birth/Sex: Treating RN: 08/29/1966 (57 y.o. [name], [name] Primary Care Shaindy Reader: Stoney Bang Other Clinician: Referring Jamiracle Avants: Treating Sundee Garland/Extender: Dallas Breeding in  Treatment: 0 Active Inactive Orientation to the Wound Care Program Nursing Diagnoses: Knowledge deficit related to the wound healing center program Goals: Patient/caregiver will verbalize understanding of the [place] Date Initiated: 06/12/2022 Target Resolution Date: 07/13/2022 Goal Status: Active Interventions: Provide education on orientation to the wound center Notes: Wound/Skin Impairment Nursing Diagnoses: Impaired tissue integrity Knowledge deficit related to ulceration/compromised skin integrity Goals: Patient will have a decrease in wound volume by X% from date: (specify in notes) Date Initiated: 06/12/2022 Target Resolution Date: 07/13/2022 Goal Status: Active Patient/caregiver will verbalize understanding of skin care regimen Date Initiated: 06/12/2022 Target Resolution Date: 07/13/2022 Goal Status: Active Ulcer/skin breakdown will have a volume reduction of 30% by week 4 Date Initiated: 06/12/2022 Target Resolution Date: 07/13/2022 Goal Status: Active Ulcer/skin breakdown will have a volume reduction of 50% by week 8 Date Initiated: 06/12/2022 Target Resolution Date: 07/13/2022 Goal Status: Active Interventions: Assess patient/caregiver ability to obtain necessary supplies Assess patient/caregiver ability to perform ulcer/skin care regimen upon admission and as needed Assess ulceration(s) every visit Notes: Electronic Signature(s) Signed: 10/09/2022 5:37:22 PM By: Rhae Hammock RN Entered By: Rhae Hammock on 06/12/2022 08:42:16 [name], [name] ([id number]) [id number] Page 6 of 8 -------------------------------------------------------------------------------- Pain Assessment Details Patient Name: Date of Service: [name], [name] 06/12/2022 8:00 A M Medical Record Number: [id number] Patient Account Number: [id number] Date of Birth/Sex: Treating RN: 11/21/1965 (57 y.o. [name], [name] Primary Care Kimyetta Flott: Stoney Bang Other  Clinician: Referring Epifanio Labrador: Treating Takahiro Godinho/Extender: Dallas Breeding in Treatment: 0 Active Problems Location of Pain Severity and Description of Pain Patient Has Paino Yes Site Locations Pain Location: Pain in Ulcers With Dressing Change: Yes Duration of the Pain. Constant / Intermittento Intermittent Rate the pain. Current Pain Level: 7 Worst Pain Level: 10 Least Pain Level: 0 Tolerable Pain Level: 7 Character of Pain Describe the Pain: Aching Pain Management and Medication Current Pain Management: Medication: No Cold Application: No Rest: No Massage: No Activity: No T.E.N.S.: No Heat Application: No Leg drop or elevation: No Is the Current Pain Management Adequate: Adequate How does your wound impact your activities of daily livingo Sleep: No Bathing: No Appetite: No Relationship With Others: No Bladder Continence: No Emotions: No Bowel Continence: No Work: No Toileting: No Drive: No Dressing: No Hobbies: No Electronic Signature(s) Signed: 10/09/2022 5:37:22 PM By: Rhae Hammock RN Entered By: Rhae Hammock on 06/12/2022 08:14:37 -------------------------------------------------------------------------------- Patient/Caregiver Education Details Patient Name: Date of Service: [name], [name] 9/6/2023andnbsp8:00 A M Medical Record Number: [id number] Patient Account Number: [id number] Date of Birth/Gender: Treating RN: Jan 11, 1966 (57 y.o. [name], Celso Sickle, Corene ([id number]) [id number] Page 7 of 8  Primary Care Physician: [name] Other Clinician: Referring Physician: Treating Physician/Extender: [name] in Treatment: 0 Education Assessment Education Provided To: Patient Education Topics Provided Welcome T The [facility]: o Methods: Explain/Verbal Responses: State content correctly Electronic Signature(s) Signed: 10/09/2022 5:37:22 PM By: Fonnie Mu RN Entered By: Fonnie Mu on 06/12/2022 08:42:26 -------------------------------------------------------------------------------- Wound Assessment Details Patient Name: Date of Service: [name], [name] 06/12/2022 8:00 A M Medical Record Number: [id number] Patient Account Number: [id number] Date of Birth/Sex: Treating RN: 1966/01/11 (57 y.o. [name], [name] Primary Care Roshawn Ayala: [name] Other Clinician: Referring Zyere Jiminez: Treating Colby Catanese/Extender: [name] in Treatment: 0 Wound Status Wound Number: 1 Primary Etiology: Diabetic Wound/Ulcer of the Lower Extremity Wound Location: Right Calcaneus Wound Status: Open Wounding Event: Gradually Appeared Comorbid History: Hypertension, Type II Diabetes, Osteomyelitis Date Acquired: 11/07/2020 Weeks Of Treatment: 0 Clustered Wound: No Photos Wound Measurements Length: (cm) 4 Width: (cm) 3.7 Depth: (cm) 0.2 Area: (cm) 11.624 Volume: (cm) 2.325 % Reduction in Area: 0% % Reduction in Volume: 0% Epithelialization: None Tunneling: No Undermining: No Wound Description Classification: Grade 2 Wound Margin: Distinct, outline attached Exudate Amount: Medium Exudate Type: Serosanguineous Exudate Color: red, brown Gal, Dreonna ([id number]) Wound Bed Granulation Amount: Large (67-100%) Granulation Quality: Red, Pink Necrotic Amount: Small (1-33%) Necrotic Quality: Adherent Slough Foul Odor After Cleansing: No Slough/Fibrino Yes [phone].pdf Page 8 of 8 Exposed Structure Fascia Exposed: No Fat Layer (Subcutaneous Tissue) Exposed: Yes Tendon Exposed: No Muscle Exposed: No Joint Exposed: No Bone Exposed: No Electronic Signature(s) Signed: 06/12/2022 5:44:03 PM By: Shawn Stall RN, BSN Signed: 10/09/2022 5:37:22 PM By: Fonnie Mu RN Entered By: Shawn Stall on 06/12/2022  08:28:06 -------------------------------------------------------------------------------- Vitals Details Patient Name: Date of Service: Utley, Caroljean 06/12/2022 8:00 A M Medical Record Number: [id number] Patient Account Number: [id number] Date of Birth/Sex: Treating RN: Dec 01, 1965 (57 y.o. [name], [name] Primary Care Sharifah Champine: [name] Other Clinician: Referring Brighid Koch: Treating Rohan Juenger/Extender: [name] in Treatment: 0 Vital Signs Time Taken: 08:09 Temperature (F): 98.5 Height (in): 65 Pulse (bpm): 112 Source: Stated Respiratory Rate (breaths/min): 17 Weight (lbs): 331 Blood Pressure (mmHg): 129/90 Source: Stated Capillary Blood Glucose (mg/dl): 782 Body Mass Index (BMI): 55.1 Reference Range: 80 - 120 mg / dl Electronic Signature(s) Signed: 10/09/2022 5:37:22 PM By: Fonnie Mu RN Entered By: Fonnie Mu on 06/12/2022 08:12:43

## 2022-10-10 NOTE — Progress Notes (Signed)
[name], [name] Reichmann ([id number]) [id number] Page 1 of 7 Visit Report for 09/11/2022 Arrival Information Details Patient Name: Date of Service: [name], [name] 09/11/2022 9:30 A M Medical Record Number: [id number] Patient Account Number: [id number] Date of Birth/Sex: Treating RN: [name] (57 y.o. F) Primary Care [name]: [name] Other Clinician: Referring [name]: Treating [name]/Extender: [name] in Treatment: 13 Visit Information History Since Last Visit Added or deleted any medications: No Patient Arrived: Ambulatory Any new allergies or adverse reactions: No Arrival Time: 09:41 Had a fall or experienced change in No Accompanied By: son activities of daily living that may affect Transfer Assistance: None risk of falls: Patient Identification Verified: Yes Signs or symptoms of abuse/neglect since last visito No Secondary Verification Process Completed: Yes Hospitalized since last visit: No Patient Requires Transmission-Based Precautions: No Implantable device outside of the clinic excluding No Patient Has Alerts: Yes cellular tissue based products placed in the center Patient Alerts: PICC in left arm since last visit: Has Dressing in Place as Prescribed: Yes Pain Present Now: No Electronic Signature(s) Signed: 09/11/2022 4:44:51 PM By: Erenest Blank Entered By: Erenest Blank on 09/11/2022 09:43:20 -------------------------------------------------------------------------------- Clinic Level of Care Assessment Details Patient Name: Date of Service: [name], [name] 09/11/2022 9:30 A M Medical Record Number: [id number] Patient Account Number: [id number] Date of Birth/Sex: Treating RN: [birth year] (57 y.o. [name], [name] Primary Care Cleavon Goldman: [name] Other Clinician: Referring Salah Burlison: Treating Robbie Rideaux/Extender: [name] in Treatment: 13 Clinic Level of Care Assessment Items TOOL 4  Quantity Score X- 1 0 Use when only an EandM is performed on FOLLOW-UP visit ASSESSMENTS - Nursing Assessment / Reassessment X- 1 10 Reassessment of Co-morbidities (includes updates in patient status) X- 1 5 Reassessment of Adherence to Treatment Plan ASSESSMENTS - Wound and Skin A ssessment / Reassessment X - Simple Wound Assessment / Reassessment - one wound 1 5 []  - 0 Complex Wound Assessment / Reassessment - multiple wounds []  - 0 Dermatologic / Skin Assessment (not related to wound area) ASSESSMENTS - Focused Assessment X- 1 5 Circumferential Edema Measurements - multi extremities []  - 0 Nutritional Assessment / Counseling / Intervention [name], [name] ([id number]) [id number] Page 2 of 7 []  - 0 Lower Extremity Assessment (monofilament, tuning fork, pulses) []  - 0 Peripheral Arterial Disease Assessment (using hand held doppler) ASSESSMENTS - Ostomy and/or Continence Assessment and Care []  - 0 Incontinence Assessment and Management []  - 0 Ostomy Care Assessment and Management (repouching, etc.) PROCESS - Coordination of Care []  - 0 Simple Patient / Family Education for ongoing care X- 1 20 Complex (extensive) Patient / Family Education for ongoing care X- 1 10 Staff obtains Programmer, systems, Records, T Results / Process Orders est []  - 0 Staff telephones HHA, Nursing Homes / Clarify orders / etc []  - 0 Routine Transfer to another Facility (non-emergent condition) []  - 0 Routine Hospital Admission (non-emergent condition) []  - 0 New Admissions / Biomedical engineer / Ordering NPWT Apligraf, etc. , []  - 0 Emergency Hospital Admission (emergent condition) []  - 0 Simple Discharge Coordination X- 1 15 Complex (extensive) Discharge Coordination PROCESS - Special Needs []  - 0 Pediatric / Minor Patient Management []  - 0 Isolation Patient Management []  - 0 Hearing / Language / Visual special needs []  - 0 Assessment of Community assistance  (transportation, D/C planning, etc.) []  - 0 Additional assistance / Altered mentation []  - 0 Support Surface(s) Assessment (bed, cushion, seat, etc.) INTERVENTIONS - Wound Cleansing / Measurement X - Simple Wound Cleansing -  one wound 1 5 []  - 0 Complex Wound Cleansing - multiple wounds X- 1 5 Wound Imaging (photographs - any number of wounds) []  - 0 Wound Tracing (instead of photographs) X- 1 5 Simple Wound Measurement - one wound []  - 0 Complex Wound Measurement - multiple wounds INTERVENTIONS - Wound Dressings []  - 0 Small Wound Dressing one or multiple wounds X- 1 15 Medium Wound Dressing one or multiple wounds []  - 0 Large Wound Dressing one or multiple wounds []  - 0 Application of Medications - topical []  - 0 Application of Medications - injection INTERVENTIONS - Miscellaneous []  - 0 External ear exam []  - 0 Specimen Collection (cultures, biopsies, blood, body fluids, etc.) []  - 0 Specimen(s) / Culture(s) sent or taken to Lab for analysis []  - 0 Patient Transfer (multiple staff / Civil Service fast streamer / Similar devices) []  - 0 Simple Staple / Suture removal (25 or less) []  - 0 Complex Staple / Suture removal (26 or more) []  - 0 Hypo / Hyperglycemic Management (close monitor of Blood Glucose) Freet, Vania ([id number]) [id number] Page 3 of 7 []  - 0 Ankle / Brachial Index (ABI) - do not check if billed separately X- 1 5 Vital Signs Has the patient been seen at the hospital within the last three years: Yes Total Score: 105 Level Of Care: New/Established - Level 3 Electronic Signature(s) Signed: 10/09/2022 5:35:40 PM By: Rhae Hammock RN Entered By: Rhae Hammock on 09/11/2022 10:13:51 -------------------------------------------------------------------------------- Encounter Discharge Information Details Patient Name: Date of Service: [name], [name] 09/11/2022 9:30 A M Medical Record Number: [id number] Patient Account Number:  [id number] Date of Birth/Sex: Treating RN: [birth year] (57 y.o. [name], [name] Primary Care Ardean Melroy: [name] Other Clinician: Referring Kelen Laura: Treating Sevanna Ballengee/Extender: [name] in Treatment: 13 Encounter Discharge Information Items Discharge Condition: Stable Ambulatory Status: Ambulatory Discharge Destination: Home Transportation: Private Auto Accompanied By: self Schedule Follow-up Appointment: Yes Clinical Summary of Care: Patient Declined Electronic Signature(s) Signed: 10/09/2022 5:35:40 PM By: Rhae Hammock RN Entered By: Rhae Hammock on 09/11/2022 10:14:43 -------------------------------------------------------------------------------- Lower Extremity Assessment Details Patient Name: Date of Service: [name], [name] 09/11/2022 9:30 A M Medical Record Number: [id number] Patient Account Number: [id number] Date of Birth/Sex: Treating RN: [birth year][name] (57 y.o. F) Primary Care Lue [place]: [name] Other Clinician: Referring Lala Been: Treating Nawaal Alling/Extender: Matt Holmes Weeks in Treatment: 13 Edema Assessment Assessed: [Left: No] [Right: No] Edema: [Left: Ye] [Right: s] Calf Left: Right: Point of Measurement: 32 cm From Medial Instep 59.5 cm Ankle Left: Right: Point of Measurement: 10 cm From Medial Instep 34.6 cm Electronic Signature(s) Signed: 09/11/2022 4:44:51 PM By: Kingsley Spittle, Elyzabeth ([id number]) [id number] Page 4 of 7 Signed: 09/11/2022 4:44:51 PM By: Erenest Blank Entered By: Erenest Blank on 09/11/2022 09:54:04 -------------------------------------------------------------------------------- Multi-Disciplinary Care Plan Details Patient Name: Date of Service: [name], [name] 09/11/2022 9:30 A M Medical Record Number: [id number] Patient Account Number: [id number] Date of Birth/Sex: Treating RN: April [birth year], [name] (57 y.o. [name], [name] Primary Care Tychelle Purkey: [name] Other Clinician: Referring Ralston Venus: Treating Truth Barot/Extender: [name] in Treatment: 13 Active Inactive Wound/Skin Impairment Nursing Diagnoses: Impaired tissue integrity Knowledge deficit related to ulceration/compromised skin integrity Goals: Patient will have a decrease in wound volume by X% from date: (specify in notes) Date Initiated: 06/12/2022 Target Resolution Date: 10/05/2022 Goal Status: Active Patient/caregiver will verbalize understanding of skin care regimen Date Initiated: 06/12/2022 Target Resolution Date: 10/05/2022 Goal Status: Active Ulcer/skin breakdown will have a volume reduction of 30% by week  4 Date Initiated: 06/12/2022 Date Inactivated: 07/31/2022 Target Resolution Date: 08/03/2022 Goal Status: Unmet Unmet Reason: dx with osteomyelitis. Ulcer/skin breakdown will have a volume reduction of 50% by week 8 Date Initiated: 06/12/2022 Target Resolution Date: 10/05/2022 Goal Status: Active Interventions: Assess patient/caregiver ability to obtain necessary supplies Assess patient/caregiver ability to perform ulcer/skin care regimen upon admission and as needed Assess ulceration(s) every visit Notes: Electronic Signature(s) Signed: 10/09/2022 5:35:40 PM By: Rhae Hammock RN Entered By: Rhae Hammock on 09/11/2022 10:12:47 -------------------------------------------------------------------------------- Pain Assessment Details Patient Name: Date of Service: [name], [name] 09/11/2022 9:30 A M Medical Record Number: [id number] Patient Account Number: [id number] Date of Birth/Sex: Treating RN: [birth year][name] (57 y.o. F) Primary Care Judd Mccubbin: [name] Other Clinician: Referring Cheyna Retana: Treating Mohogany Toppins/Extender: [name] in Treatment: 13 Active Problems Location of Pain Severity and Description of Pain Patient Has [name], [name] ([id number]) [id number] Page 5 of  7 Patient Has Paino Yes Site Locations Pain Location: Pain in Ulcers Rate the pain. Current Pain Level: 6 Pain Management and Medication Current Pain Management: Electronic Signature(s) Signed: 09/11/2022 4:44:51 PM By: Erenest Blank Entered By: Erenest Blank on 09/11/2022 09:45:47 -------------------------------------------------------------------------------- Patient/Caregiver Education Details Patient Name: Date of Service: [name] 12/6/2023andnbsp9:30 A M Medical Record Number: [id number] Patient Account Number: [id number] Date of Birth/Gender: Treating RN: [birth year] (57 y.o. [name], [name] Primary Care Physician: [name] Other Clinician: Referring Physician: Treating Physician/Extender: [name] in Treatment: 13 Education Assessment Education Provided To: Patient Education Topics Provided Wound/Skin Impairment: Methods: Explain/Verbal Responses: Reinforcements needed, State content correctly Electronic Signature(s) Signed: 10/09/2022 5:35:40 PM By: Rhae Hammock RN Entered By: Rhae Hammock on 09/11/2022 10:13:03 -------------------------------------------------------------------------------- Wound Assessment Details Patient Name: Date of Service: [name], [name] 09/11/2022 9:30 A M Medical Record Number: [id number] Patient Account Number: [id number] Date of Birth/Sex: Treating RN: [birth year] (57 y.o. [name], [name] ([id number]) [id number] Page 6 of 7 Primary Care Ryan Palermo: [name] Other Clinician: Referring Donaldson Richter: Treating Tanmay Halteman/Extender: [name] in Treatment: 13 Wound Status Wound Number: 1 Primary Etiology: Diabetic Wound/Ulcer of the Lower Extremity Wound Location: Right Calcaneus Wound Status: Open Wounding Event: Gradually Appeared Comorbid History: Hypertension, Type II Diabetes, Osteomyelitis Date Acquired: 11/07/2020 Weeks Of Treatment:  13 Clustered Wound: No Photos Wound Measurements Length: (cm) 9 Width: (cm) 2.8 Depth: (cm) 0.5 Area: (cm) 19.792 Volume: (cm) 9.896 % Reduction in Area: -70.3% % Reduction in Volume: -325.6% Epithelialization: Small (1-33%) Tunneling: No Undermining: No Wound Description Classification: Grade 3 Wound Margin: Distinct, outline attached Exudate Amount: Large Exudate Type: Serosanguineous Exudate Color: red, brown Foul Odor After Cleansing: No Slough/Fibrino Yes Wound Bed Granulation Amount: Large (67-100%) Exposed Structure Granulation Quality: Red, Pink Fascia Exposed: No Necrotic Amount: Small (1-33%) Fat Layer (Subcutaneous Tissue) Exposed: Yes Necrotic Quality: Adherent Slough Tendon Exposed: No Muscle Exposed: No Joint Exposed: No Bone Exposed: Yes Periwound Skin Texture Texture Color No Abnormalities Noted: No No Abnormalities Noted: No Callus: No Atrophie Blanche: No Crepitus: No Cyanosis: No Excoriation: No Ecchymosis: No Induration: No Erythema: Yes Rash: No Erythema Location: Circumferential Scarring: No Erythema Change: No Change Hemosiderin Staining: No Moisture Mottled: No No Abnormalities Noted: No Pallor: No Dry / Scaly: No Rubor: No Maceration: No Temperature / Pain Temperature: No Abnormality Tenderness on Palpation: Yes Electronic Signature(s) Signed: 09/11/2022 4:44:51 PM By: Erenest Blank Entered By: Erenest Blank on 09/11/2022 09:56:41 [name], [name] ([id number]) [id number] Page 7 of 7 -------------------------------------------------------------------------------- Vitals Details Patient Name: Date of Service: [name], [name] 09/11/2022  9:30 A M Medical Record Number: [id number] Patient Account Number: [id number] Date of Birth/Sex: Treating RN: [birth year] (57 y.o. F) Primary Care Manasvini Whatley: [name] Other Clinician: Referring Sandon Yoho: Treating Aviya Jarvie/Extender: [name]  in Treatment: 13 Vital Signs Time Taken: 09:43 Temperature (F): 98.8 Height (in): 65 Pulse (bpm): 81 Weight (lbs): 331 Respiratory Rate (breaths/min): 18 Body Mass Index (BMI): 55.1 Blood Pressure (mmHg): 129/84 Capillary Blood Glucose (mg/dl): 163 Reference Range: 80 - 120 mg / dl Electronic Signature(s) Signed: 09/11/2022 4:44:51 PM By: Erenest Blank Entered By: Erenest Blank on 09/11/2022 09:45:21

## 2022-10-10 NOTE — Progress Notes (Signed)
[name] ([id number]) [id number] Page 1 of 4 Visit Report for 06/12/2022 Abuse Risk Screen Details Patient Name: Date of Service: [name], [name] 06/12/2022 8:00 A M Medical Record Number: [id number] Patient Account Number: [id number] Date of Birth/Sex: Treating RN: 09-14-1966 (57 y.o. [name], [name] Primary Care Tyriek Hofman: [name] Other Clinician: Referring [name]: Treating [name] [name] in [name]: 0 Abuse Risk Screen Items Answer ABUSE RISK SCREEN: Has anyone close to you tried to hurt or harm you recentlyo No Do you feel uncomfortable with anyone in your familyo No Has anyone forced you do things that you didnt want to doo No Electronic Signature(s) Signed: 10/09/2022 5:37:22 PM By: Rhae Hammock RN Entered By: Rhae Hammock on 06/12/2022 08:10:03 -------------------------------------------------------------------------------- Activities of Daily Living Details Patient Name: Date of Service: [name], [name] 06/12/2022 8:00 A M Medical Record Number: [id number] Patient Account Number: [id number] Date of Birth/Sex: Treating RN: 1966-03-19 (57 y.o. [name], [name] Primary Care [name]: [name] Other Clinician: Referring [name]: Treating [name]/Extender: [name]: 0 Activities of Daily Living Items Answer Activities of Daily Living (Please select one for each item) Drive Automobile Completely Able T Medications ake Completely Able Use T elephone Completely Able Care for Appearance Completely Able Use T oilet Completely Able Bath / Shower Completely Able Dress Self Completely Able Feed Self Completely Able Walk Completely Able Get In / Out Bed Completely Able Housework Completely Able Prepare Meals Completely Able Handle Money Completely Able Shop for Self Completely Able Electronic Signature(s) Signed: 10/09/2022 5:37:22 PM By:  Rhae Hammock RN Entered By: Rhae Hammock on 06/12/2022 08:10:24 [name] ([id number]) [id number] Page 2 of 4 -------------------------------------------------------------------------------- Education Screening Details Patient Name: Date of Service: [name] 06/12/2022 8:00 A M Medical Record Number: [id number] Patient Account Number: [id number] Date of Birth/Sex: Treating RN: April 18, 1966 (56 y.o. [name], [name] Primary Care [name]: [name] Other Clinician: Referring Audi Wettstein: Treating [name]/Extender: [name]: 0 Primary Learner Assessed: Patient Learning Preferences/Education Level/Primary Language Learning Preference: Explanation, Demonstration, Communication Board, Printed Material Highest Education Level: College or Above Preferred Language: English Cognitive Barrier Language Barrier: No Translator Needed: No Memory Deficit: No Emotional Barrier: No Cultural/Religious Beliefs Affecting Medical Care: No Physical Barrier Impaired Vision: Yes Glasses Impaired Hearing: No Decreased Hand dexterity: No Knowledge/Comprehension Knowledge Level: High Comprehension Level: High Ability to understand written instructions: High Ability to understand verbal instructions: High Motivation Anxiety Level: Calm Cooperation: Cooperative Education Importance: Denies Need Interest in Health Problems: Asks Questions Perception: Coherent Willingness to Engage in Self-Management High Activities: Readiness to Engage in Self-Management High Activities: Electronic Signature(s) Signed: 10/09/2022 5:37:22 PM By: Rhae Hammock RN Entered By: Rhae Hammock on 06/12/2022 08:14:10 -------------------------------------------------------------------------------- Fall Risk Assessment Details Patient Name: Date of Service: [name], [name] 06/12/2022 8:00 A M Medical Record Number:  [id number] Patient Account Number: [id number] Date of Birth/Sex: Treating RN: 07-20-1966 (57 y.o. [name], [name] Primary Care [name]: [name] Other Clinician: Referring [name]: Treating [name]/Extender: [name]: 0 Fall Risk Assessment Items Have you had 2 or more falls in the last 12 monthso 0 No [name] ([id number]) [id number] Page 3 of 4 Have you had any fall that resulted in injury in the last 12 monthso 0 No FALLS RISK SCREEN History of falling - immediate or within 3 months 0 No Secondary diagnosis (Do you have 2 or more medical diagnoseso) 0 No Ambulatory aid None/bed rest/wheelchair/nurse 0  No Crutches/cane/walker 0 No Furniture 0 No Intravenous therapy Access/Saline/Heparin Lock 0 No Gait/Transferring Normal/ bed rest/ wheelchair 0 No Weak (short steps with or without shuffle, stooped but able to lift head while walking, may seek 0 No support from furniture) Impaired (short steps with shuffle, may have difficulty arising from chair, head down, impaired 0 No balance) Mental Status Oriented to own ability 0 No Electronic Signature(s) Signed: 10/09/2022 5:37:22 PM By: Rhae Hammock RN Entered By: Rhae Hammock on 06/12/2022 08:10:31 -------------------------------------------------------------------------------- Foot Assessment Details Patient Name: Date of Service: [name], [name] 06/12/2022 8:00 A M Medical Record Number: [id number] Patient Account Number: [id number] Date of Birth/Sex: Treating RN: 1966/03/17 (57 y.o. [name], [name] Primary Care [name]: [name] Other Clinician: Referring [name]: Treating [name]/Extender: [name]: 0 Foot Assessment Items Site Locations + = Sensation present, - = Sensation absent, C = Callus, U = Ulcer R = Redness, W = Warmth, M = Maceration, PU = Pre-ulcerative lesion F = Fissure,  S = Swelling, D = Dryness Assessment Right: Left: Other Deformity: No No Prior Foot Ulcer: Yes No Prior Amputation: No No Charcot Joint: No No Ambulatory Status: Ambulatory Without Help Gait: Steady [name] ([id number]) [id number] Page 4 of 4 Electronic Signature(s) Signed: 10/09/2022 5:37:22 PM By: Rhae Hammock RN Entered By: Rhae Hammock on 06/12/2022 08:23:00 -------------------------------------------------------------------------------- Nutrition Risk Screening Details Patient Name: Date of Service: [name], [name] 06/12/2022 8:00 A M Medical Record Number: [id number] Patient Account Number: [id number] Date of Birth/Sex: Treating RN: 1966/09/06 (57 y.o. Benjaman Lobe Primary Care Kalyse Meharg: [name] Other Clinician: Referring Loveda Colaizzi: Treating Rut Betterton/Extender: [name]: 0 Height (in): Weight (lbs): Body Mass Index (BMI): Nutrition Risk Screening Items Score Screening NUTRITION RISK SCREEN: I have an illness or condition that made me change the kind and/or amount of food I eat 0 No I eat fewer than two meals per day 0 No I eat few fruits and vegetables, or milk products 0 No I have three or more drinks of beer, liquor or wine almost every day 0 No I have tooth or mouth problems that make it hard for me to eat 0 No I don't always have enough money to buy the food I need 0 No I eat alone most of the time 0 No I take three or more different prescribed or over-the-counter drugs a day 0 No Without wanting to, I have lost or gained 10 pounds in the last six months 0 No I am not always physically able to shop, cook and/or feed myself 0 No Nutrition Protocols Good Risk Protocol 0 No interventions needed Moderate Risk Protocol High Risk Proctocol Risk Level: Good Risk Score: 0 Electronic Signature(s) Signed: 10/09/2022 5:37:22 PM By: Rhae Hammock RN Entered By: Rhae Hammock  on 06/12/2022 08:10:37

## 2022-10-10 NOTE — Progress Notes (Signed)
  Care Coordination Note  10/10/2022 Name: [name] MRN: [id number] DOB: 1966-08-18  [name] is a 57 y.o. year old female who is a primary care patient of Hasanaj, Samul Dada, MD and is actively engaged with the care management team. I reached out to Sabino Snipes by phone today to assist with re-scheduling a follow up visit with the RN Case Manager  Follow up plan: Telephone appointment with care management team member scheduled for:10/18/22  [place]inson: [phone]

## 2022-10-10 NOTE — Progress Notes (Signed)
Sobel, [name] ([id number]) [id number] Page 1 of 10 Visit Report for 06/12/2022 Chief Complaint Document Details Patient Name: Date of Service: [name], [name] 06/12/2022 8:00 A M Medical Record Number: [id number] Patient Account Number: [id number] Date of Birth/Sex: Treating RN: [birth year] (57 y.o. F) Primary Care Provider: Stoney Bang Other Clinician: Referring Provider: Treating Provider/Extender: Dallas Breeding in Treatment: 0 Information Obtained from: Patient Chief Complaint Right heel ulcer Electronic Signature(s) Signed: 06/13/2022 5:48:59 PM By: Worthy Keeler PA-C Entered By: Worthy Keeler on 06/13/2022 17:48:59 -------------------------------------------------------------------------------- Debridement Details Patient Name: Date of Service: [name], [name] 06/12/2022 8:00 A M Medical Record Number: [id number] Patient Account Number: [id number] Date of Birth/Sex: Treating RN: 1966-06-17 (57 y.o. [name], [name] Primary Care Provider: Stoney Bang Other Clinician: Referring Provider: Treating Provider/Extender: Dallas Breeding in Treatment: 0 Debridement Performed for Assessment: Wound #1 Right Calcaneus Performed By: Physician Worthy Keeler, PA Debridement Type: Debridement Severity of Tissue Pre Debridement: Fat layer exposed Level of Consciousness (Pre-procedure): Awake and Alert Pre-procedure Verification/Time Out Yes - 09:11 Taken: Start Time: 09:11 Pain Control: Lidocaine T Area Debrided (L x W): otal 4 (cm) x 3.7 (cm) = 14.8 (cm) Tissue and other material debrided: Viable, Non-Viable, Callus Level: Non-Viable Tissue Debridement Description: Selective/Open Wound Instrument: Curette Bleeding: Minimum Hemostasis Achieved: Pressure End Time: 09:11 Procedural Pain: 0 Post Procedural Pain: 0 Response to Treatment: Procedure was tolerated well Level of Consciousness (Post- Awake and  Alert procedure): Post Debridement Measurements of Total Wound Length: (cm) 4 Width: (cm) 3.7 Depth: (cm) 0.2 Volume: (cm) 2.325 Character of Wound/Ulcer Post Debridement: Improved Severity of Tissue Post Debridement: Fat layer exposed Aten, [name] Page 2 of 10 Post Procedure Diagnosis Same as Pre-procedure Electronic Signature(s) Signed: 06/12/2022 7:10:08 PM By: Worthy Keeler PA-C Signed: 10/09/2022 5:37:22 PM By: Rhae Hammock RN Entered By: Rhae Hammock on 06/12/2022 09:18:26 -------------------------------------------------------------------------------- HPI Details Patient Name: Date of Service: [name], [name] 06/12/2022 8:00 A M Medical Record Number: [id number] Patient Account Number: [id number] Date of Birth/Sex: Treating RN: May 24, 1966 (57 y.o. F) Primary Care Provider: Stoney Bang Other Clinician: Referring Provider: Treating Provider/Extender: Dallas Breeding in Treatment: 0 History of Present Illness HPI Description: 06-13-2022 upon evaluation today patient presents for evaluation of her right heel ulcer. She is having a tremendous amount of pain at this location. She tells me that her most recent hemoglobin A1c was 8.7 and that was on 08-23-2021. This ulcer on the heel has been present she states since February 2023 when she had an injection to the heel by podiatry. She states that she began to have increasing pain the wound opened and has been open ever since. 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 She had an x-ray in June if anything can although there did not appear to be any obvious evidence of osteomyelitis at that point. She subsequently has had OR debridement several times of the wound performed by podiatry. Patient does have a history of lymphedema of the lower extremities she is also a type II diabetic. Electronic Signature(s) Signed: 06/13/2022 5:50:20 PM  By: Worthy Keeler PA-C Previous Signature: 06/12/2022 7:01:57 PM Version By: Worthy Keeler PA-C Previous Signature: 06/12/2022 7:01:42 PM Version By: Worthy Keeler PA-C Entered By: Worthy Keeler on 06/13/2022 17:50:20 -------------------------------------------------------------------------------- Physical Exam Details Patient Name: Date of Service: [name], [name] 06/12/2022 8:00 A M Medical Record Number: [id number] Patient Account Number: [id number] Date of Birth/Sex: Treating  RN: [birth year] (57 y.o. F) Primary Care Provider: Stoney Bang Other Clinician: Referring Provider: Treating Provider/Extender: Dallas Breeding in Treatment: 0 Constitutional sitting or standing blood pressure is within target range for patient.. pulse regular and within target range for patient.Marland Kitchen respirations regular, non-labored and within target range for patient.Marland Kitchen temperature within target range for patient.. Obese and well-hydrated in no acute distress. Eyes conjunctiva clear no eyelid edema noted. pupils equal round and reactive to light and accommodation. Ears, Nose, Mouth, and Throat no gross abnormality of ear auricles or external auditory canals. normal hearing noted during conversation. mucus membranes moist. Respiratory normal breathing without difficulty. Cardiovascular 2+ dorsalis pedis/posterior tibialis pulses. no clubbing, cyanosis, significant edema, <3 sec cap refill. [name], [name] Page 3 of 10 Musculoskeletal Patient unable to walk without assistance. no significant deformity or arthritic changes, no loss or range of motion, no clubbing. Psychiatric this patient is able to make decisions and demonstrates good insight into disease process. Alert and Oriented x 3. pleasant and cooperative. Notes Upon inspection patient's wound bed actually showed signs of some callus buildup around the edges of the wound. Fortunately there  does not appear to be any signs of active infection locally or systemically which is great news. No fevers, chills, nausea, vomiting, or diarrhea. I did perform a light debridement around the edges of the wound to clearway some of this callus she tolerated that today without complication and postdebridement appears to be doing much better which is good news. This is very macerated however we need to do something to try and help with keeping the maceration and drainage under control. Electronic Signature(s) Signed: 06/13/2022 5:51:11 PM By: Worthy Keeler PA-C Entered By: Worthy Keeler on 06/13/2022 17:51:11 -------------------------------------------------------------------------------- Physician Orders Details Patient Name: Date of Service: [name], [name] 06/12/2022 8:00 A M Medical Record Number: [id number] Patient Account Number: [id number] Date of Birth/Sex: Treating RN: [birth year] (57 y.o. [name], [name] Primary Care Provider: Stoney Bang Other Clinician: Referring Provider: Treating Provider/Extender: Dallas Breeding in Treatment: 0 Verbal / Phone Orders: No Diagnosis Coding Follow-up Appointments ppointment in 1 week. - Wednesday w/ Jeri Cos and Allayne Butcher Rm # 9 Return A Nurse Visit: - FRiday 06/14/22 @ 0830 OVERFLOW RM # 6 Anesthetic (In clinic) Topical Lidocaine 5% applied to wound bed Bathing/ Shower/ Hygiene May shower with protection but do not get wound dressing(s) wet. - May use a cast protector. YOu can find those at [facility] or [facility] Edema Control - Lymphedema / SCD / Other Elevate legs to the level of the heart or above for 30 minutes daily and/or when sitting, a frequency of: Avoid standing for long periods of time. Off-Loading Heel suspension boot to: Wound Treatment Wound #1 - Calcaneus Wound Laterality: Right Cleanser: Soap and Water 1 x Per Week/7 Days Discharge Instructions: May shower and wash wound with dial antibacterial soap and  water prior to dressing change. Cleanser: Wound Cleanser 1 x Per Week/7 Days Discharge Instructions: Cleanse the wound with wound cleanser prior to applying a clean dressing using gauze sponges, not tissue or cotton balls. Peri-Wound Care: Zinc Oxide Ointment 30g tube 1 x Per Week/7 Days Discharge Instructions: Apply Zinc Oxide to periwound with each dressing change Prim Dressing: Hydrofera Blue Ready Foam, 4x5 in 1 x Per Week/7 Days ary Discharge Instructions: Apply to wound bed as instructed Secondary Dressing: ABD Pad, 5x9 1 x Per Week/7 Days Discharge Instructions: Apply over primary dressing as directed. Secondary Dressing:  Optifoam Non-Adhesive Dressing, 4x4 in 1 x Per Week/7 Days Discharge Instructions: Make a foam donut and Apply over primary dressing as directed. Secondary Dressing: Woven Gauze Sponge, Non-Sterile 4x4 in 1 x Per Week/7 Days Discharge Instructions: Apply over primary dressing as directed. Compression Wrap: CoFlex TLC XL 2-layer Compression System 4x7 (in/yd) 1 x Per Week/7 Days Mcclain, Taren ([id number]) [id number].pdf Page 4 of 10 Discharge Instructions: Apply CoFlex 2-layer compression as directed. (alt for 4 layer) Radiology Magnetic Resonance Imaging (MRI) - Right foot r/t right calcaneus ulcer- non-healing; looking for infection; Patient Medications llergies: No Known Allergies A Notifications Medication Indication Start End PRN debridement/pain 06/12/2022 lidocaine DOSE topical 5 % gel - gel topical Electronic Signature(s) Signed: 06/12/2022 7:10:08 PM By: Worthy Keeler PA-C Signed: 10/09/2022 5:37:22 PM By: Rhae Hammock RN Entered By: Rhae Hammock on 06/12/2022 09:36:43 Prescription 06/12/2022 -------------------------------------------------------------------------------- Shon Hale PA Patient Name: Provider: 07-04-1966 [id number] Date of Birth: NPI#Rickey Primus Sex: DEA #: [id number]  [id number] Phone #: License #: [place] Patient Address: [address] [place], [place] [address] [place], [place] [address] [phone] Allergies No Known Allergies Provider's Orders Magnetic Resonance Imaging (MRI) - Right foot r/t right calcaneus ulcer- non-healing; looking for infection; Hand Signature: Date(s): Electronic Signature(s) Signed: 06/12/2022 7:10:08 PM By: Worthy Keeler PA-C Signed: 10/09/2022 5:37:22 PM By: Rhae Hammock RN Entered By: Rhae Hammock on 06/12/2022 09:36:43 -------------------------------------------------------------------------------- Problem List Details Patient Name: Date of Service: [name], [name] 06/12/2022 8:00 A M Medical Record Number: [id number] Patient Account Number: [id number] Date of Birth/Sex: Treating RN: 11-17-1965 (57 y.o. F) Primary Care Provider: Stoney Bang Other Clinician: Referring Provider: Treating Provider/Extender: Dallas Breeding in Treatment: 0 Mangas, Kimberely ([id number]) [id number] Page 5 of 10 Active Problems ICD-10 Encounter Code Description Active Date MDM Diagnosis E11.621 Type 2 diabetes mellitus with foot ulcer 06/12/2022 No Yes L97.412 Non-pressure chronic ulcer of right heel and midfoot with fat layer exposed 06/12/2022 No Yes I89.0 Lymphedema, not elsewhere classified 06/12/2022 No Yes Inactive Problems Resolved Problems Electronic Signature(s) Signed: 06/12/2022 7:02:48 PM By: Worthy Keeler PA-C Entered By: Worthy Keeler on 06/12/2022 19:02:48 -------------------------------------------------------------------------------- Progress Note Details Patient Name: Date of Service: [name], [name] 06/12/2022 8:00 A M Medical Record Number: [id number] Patient Account Number: [id number] Date of Birth/Sex: Treating RN: [birth year] (57 y.o. F) Primary Care Provider: Stoney Bang Other Clinician: Referring  Provider: Treating Provider/Extender: Dallas Breeding in Treatment: 0 Subjective Chief Complaint Information obtained from Patient Right heel ulcer History of Present Illness (HPI) 06-13-2022 upon evaluation today patient presents for evaluation of her right heel ulcer. She is having a tremendous amount of pain at this location. She tells me that her most recent hemoglobin A1c was 8.7 and that was on 08-23-2021. This ulcer on the heel has been present she states since February 2023 when she had an injection to the heel by podiatry. She states that she began to have increasing pain the wound opened and has been open ever since. 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 She had an x-ray in June if anything can although there did not appear to be any obvious evidence of osteomyelitis at that point. She subsequently has had OR debridement several times of the wound performed by podiatry. Patient does have a history of lymphedema of the lower extremities she is also a type II diabetic. Patient History Information  obtained from Patient, Chart. Allergies No Known Allergies Family History Unknown History. Social History Never smoker, Marital Status - Married, Alcohol Use - Never, Drug Use - No History, Caffeine Use - Rarely. Medical History Cardiovascular Patient has history of Hypertension Endocrine Patient has history of Type II Diabetes Musculoskeletal Barbour, Dhrithi ([id number] Page 6 of 10 Patient has history of Osteomyelitis Hospitalization/Surgery History - right second toe amputation. - IandD right foot. - cholecystectomy. - ORIF right ankle. Medical A Surgical History Notes nd Gastrointestinal CHRONIC GERD Psychiatric DEPRESSION AND ANXIETY Review of Systems (ROS) Constitutional Symptoms (General Health) Denies complaints or symptoms of Fatigue, Fever, Chills, Marked Weight  Change. Eyes Denies complaints or symptoms of Dry Eyes, Vision Changes, Glasses / Contacts. Ear/Nose/Mouth/Throat Denies complaints or symptoms of Chronic sinus problems or rhinitis. Respiratory Denies complaints or symptoms of Chronic or frequent coughs, Shortness of Breath. Genitourinary Denies complaints or symptoms of Frequent urination. Integumentary (Skin) Complains or has symptoms of Wounds. Neurologic Denies complaints or symptoms of Numbness/parasthesias. Psychiatric Denies complaints or symptoms of Claustrophobia, Suicidal. Objective Constitutional sitting or standing blood pressure is within target range for patient.. pulse regular and within target range for patient.Marland Kitchen respirations regular, non-labored and within target range for patient.Marland Kitchen temperature within target range for patient.. Obese and well-hydrated in no acute distress. Vitals Time Taken: 8:09 AM, Height: 65 in, Source: Stated, Weight: 331 lbs, Source: Stated, BMI: 55.1, Temperature: 98.5 F, Pulse: 112 bpm, Respiratory Rate: 17 breaths/min, Blood Pressure: 129/90 mmHg, Capillary Blood Glucose: 162 mg/dl. Eyes conjunctiva clear no eyelid edema noted. pupils equal round and reactive to light and accommodation. Ears, Nose, Mouth, and Throat no gross abnormality of ear auricles or external auditory canals. normal hearing noted during conversation. mucus membranes moist. Respiratory normal breathing without difficulty. Cardiovascular 2+ dorsalis pedis/posterior tibialis pulses. no clubbing, cyanosis, significant edema, Musculoskeletal Patient unable to walk without assistance. no significant deformity or arthritic changes, no loss or range of motion, no clubbing. Psychiatric this patient is able to make decisions and demonstrates good insight into disease process. Alert and Oriented x 3. pleasant and cooperative. General Notes: Upon inspection patient's wound bed actually showed signs of some callus buildup around  the edges of the wound. Fortunately there does not appear to be any signs of active infection locally or systemically which is great news. No fevers, chills, nausea, vomiting, or diarrhea. I did perform a light debridement around the edges of the wound to clearway some of this callus she tolerated that today without complication and postdebridement appears to be doing much better which is good news. This is very macerated however we need to do something to try and help with keeping the maceration and drainage under control. Integumentary (Hair, Skin) Wound #1 status is Open. Original cause of wound was Gradually Appeared. The date acquired was: 11/07/2020. The wound is located on the Right Calcaneus. The wound measures 4cm length x 3.7cm width x 0.2cm depth; 11.624cm^2 area and 2.325cm^3 volume. There is Fat Layer (Subcutaneous Tissue) exposed. There is no tunneling or undermining noted. There is a medium amount of serosanguineous drainage noted. The wound margin is distinct with the outline attached to the wound base. There is large (67-100%) red, pink granulation within the wound bed. There is a small (1-33%) amount of necrotic tissue within the wound bed including Adherent Slough. Assessment Active Problems ICD-10 Type 2 diabetes mellitus with foot ulcer Non-pressure chronic ulcer of right heel and midfoot with fat layer exposed Lymphedema, not elsewhere classified [name], [name] (  [id number]) [id number] Page 7 of 10 Procedures Wound #1 Pre-procedure diagnosis of Wound #1 is a Diabetic Wound/Ulcer of the Lower Extremity located on the Right Calcaneus .Severity of Tissue Pre Debridement is: Fat layer exposed. There was a Selective/Open Wound Non-Viable Tissue Debridement with a total area of 14.8 sq cm performed by Worthy Keeler, PA. With the following instrument(s): Curette to remove Viable and Non-Viable tissue/material. Material removed includes Callus after  achieving pain control using Lidocaine. No specimens were taken. A time out was conducted at 09:11, prior to the start of the procedure. A Minimum amount of bleeding was controlled with Pressure. The procedure was tolerated well with a pain level of 0 throughout and a pain level of 0 following the procedure. Post Debridement Measurements: 4cm length x 3.7cm width x 0.2cm depth; 2.325cm^3 volume. Character of Wound/Ulcer Post Debridement is improved. Severity of Tissue Post Debridement is: Fat layer exposed. Post procedure Diagnosis Wound #1: Same as Pre-Procedure Pre-procedure diagnosis of Wound #1 is a Diabetic Wound/Ulcer of the Lower Extremity located on the Right Calcaneus . There was a Four Layer Compression Therapy Procedure by Rhae Hammock, RN. Post procedure Diagnosis Wound #1: Same as Pre-Procedure Plan Follow-up Appointments: Return Appointment in 1 week. - Wednesday w/ Jeri Cos and Allayne Butcher Rm # 9 Nurse Visit: - FRiday 06/14/22 @ 0830 OVERFLOW RM # 6 Anesthetic: (In clinic) Topical Lidocaine 5% applied to wound bed Bathing/ Shower/ Hygiene: May shower with protection but do not get wound dressing(s) wet. - May use a cast protector. YOu can find those at [facility] or [facility] Edema Control - Lymphedema / SCD / Other: Elevate legs to the level of the heart or above for 30 minutes daily and/or when sitting, a frequency of: Avoid standing for long periods of time. Off-Loading: Heel suspension boot to: Radiology ordered were: Magnetic Resonance Imaging (MRI) - Right foot r/t right calcaneus ulcer- non-healing; looking for infection; The following medication(s) was prescribed: lidocaine topical 5 % gel gel topical for PRN debridement/pain was prescribed at facility WOUND #1: - Calcaneus Wound Laterality: Right Cleanser: Soap and Water 1 x Per Week/7 Days Discharge Instructions: May shower and wash wound with dial antibacterial soap and water prior to dressing change. Cleanser: Wound  Cleanser 1 x Per Week/7 Days Discharge Instructions: Cleanse the wound with wound cleanser prior to applying a clean dressing using gauze sponges, not tissue or cotton balls. Peri-Wound Care: Zinc Oxide Ointment 30g tube 1 x Per Week/7 Days Discharge Instructions: Apply Zinc Oxide to periwound with each dressing change Prim Dressing: Hydrofera Blue Ready Foam, 4x5 in 1 x Per Week/7 Days ary Discharge Instructions: Apply to wound bed as instructed Secondary Dressing: ABD Pad, 5x9 1 x Per Week/7 Days Discharge Instructions: Apply over primary dressing as directed. Secondary Dressing: Optifoam Non-Adhesive Dressing, 4x4 in 1 x Per Week/7 Days Discharge Instructions: Make a foam donut and Apply over primary dressing as directed. Secondary Dressing: Woven Gauze Sponge, Non-Sterile 4x4 in 1 x Per Week/7 Days Discharge Instructions: Apply over primary dressing as directed. Com pression Wrap: CoFlex TLC XL 2-layer Compression System 4x7 (in/yd) 1 x Per Week/7 Days Discharge Instructions: Apply CoFlex 2-layer compression as directed. (alt for 4 layer) 1. I am good recommend that we go ahead and continue with the wound care measures as before and the patient is in agreement with plan. This includes the use of the zinc oxide around the edges of the wound which I think is going to be a good option here.  2. Would recommend Hydrofera Blue and is the primary dressing. 3. I would recommend an ABD pad to cover and we will get a use roll gauze to cover over following. 4. I am also going to suggest that we utilize the 4-layer compression wrap to try to get the edema under control here. I think we can get some of the swelling down we can subsequently get the drainage to diminish. I also believe that the patient would benefit from a total contact cast for right now her leg is a little bit too big for [place] to be able to cast. If we get the swelling down a bit we may be able to fit her into the cast. We will see  patient back for reevaluation in 1 week here in the clinic. If anything worsens or changes patient will contact our office for additional recommendations. Electronic Signature(s) Signed: 06/13/2022 5:52:39 PM By: Worthy Keeler PA-C Entered By: Worthy Keeler on 06/13/2022 17:52:39 [name], [name] Page 8 of 10 -------------------------------------------------------------------------------- HxROS Details Patient Name: Date of Service: [name], [name] 06/12/2022 8:00 A M Medical Record Number: [id number] Patient Account Number: [id number] Date of Birth/Sex: Treating RN: 10/19/1965 (57 y.o. [name], [name] Primary Care Provider: Stoney Bang Other Clinician: Referring Provider: Treating Provider/Extender: Dallas Breeding in Treatment: 0 Information Obtained From Patient Chart Constitutional Symptoms (General Health) Complaints and Symptoms: Negative for: Fatigue; Fever; Chills; Marked Weight Change Eyes Complaints and Symptoms: Negative for: Dry Eyes; Vision Changes; Glasses / Contacts Ear/Nose/Mouth/Throat Complaints and Symptoms: Negative for: Chronic sinus problems or rhinitis Respiratory Complaints and Symptoms: Negative for: Chronic or frequent coughs; Shortness of Breath Genitourinary Complaints and Symptoms: Negative for: Frequent urination Integumentary (Skin) Complaints and Symptoms: Positive for: Wounds Neurologic Complaints and Symptoms: Negative for: Numbness/parasthesias Psychiatric Complaints and Symptoms: Negative for: Claustrophobia; Suicidal Medical History: Past Medical History Notes: DEPRESSION AND ANXIETY Hematologic/Lymphatic Cardiovascular Medical History: Positive for: Hypertension Gastrointestinal Medical History: Past Medical History Notes: CHRONIC GERD Endocrine Medical HistoryJya [name], [name] Page 9 of 10 Positive for:  Type II Diabetes Immunological Musculoskeletal Medical History: Positive for: Osteomyelitis Oncologic Immunizations Pneumococcal Vaccine: Received Pneumococcal Vaccination: No Implantable Devices None Hospitalization / Surgery History Type of Hospitalization/Surgery right second toe amputation IandD right foot cholecystectomy ORIF right ankle Family and Social History Unknown History: Yes; Never smoker; Marital Status - Married; Alcohol Use: Never; Drug Use: No History; Caffeine Use: Rarely; Financial Concerns: No; Food, Clothing or Shelter Needs: No; Support System Lacking: No; Transportation Concerns: No Electronic Signature(s) Signed: 06/12/2022 7:10:08 PM By: Worthy Keeler PA-C Signed: 10/09/2022 5:37:22 PM By: Rhae Hammock RN Entered By: Rhae Hammock on 06/12/2022 08:13:34 -------------------------------------------------------------------------------- SuperBill Details Patient Name: Date of Service: CYRIA, VESCO 06/12/2022 Medical Record Number: [id number] Patient Account Number: [id number] Date of Birth/Sex: Treating RN: 1966-09-11 (56 y.o. 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 I89.0 Lymphedema, not elsewhere classified Facility Procedures : CPT4 Code: [id number] Description: 99214 - WOUND CARE VISIT-LEV 4 EST PT Modifier: Quantity: 1 Physician Procedures : CPT4 Code Description Modifier [id number] WC PHYS LEVEL 3 NEW PT ICD-10 Diagnosis Description E11.621 Type 2 diabetes mellitus with foot ulcer Behring, Alese ([id number] L97.412 Non-pressure chronic ulcer of right heel and  midfoot with  fat layer exposed I89.0  Lymphedema, not elsewhere classified Quantity: 1 7.pdf Page 10 of 10 Electronic Signature(s) Signed: 06/12/2022 7:03:08 PM By: Worthy Keeler PA-C Entered By: Worthy Keeler on 06/12/2022 19:03:08

## 2022-10-10 NOTE — Progress Notes (Signed)
  Care Coordination Note  10/10/2022 Name: [name] MRN: [id number] DOB: 17-Jul-1966  [name] is a 57 y.o. year old female who is a primary care patient of Hasanaj, Samul Dada, MD and is actively engaged with the care management team. I reached out to Sabino Snipes by phone today to assist with re-scheduling a follow up visit with the RN Case Manager  Follow up plan: Unsuccessful telephone outreach attempt made. A HIPAA compliant phone message was left for the patient providing contact information and requesting a return call.   [place]  Direct Dial: [phone]

## 2022-10-10 NOTE — Progress Notes (Signed)
Clock, [name] ([id number]) [id number] Page 1 of 7 Visit Report for 10/09/2022 Arrival Information Details Patient Name: Date of Service: [name], [name] 10/09/2022 10:30 A M Medical Record Number: [id number] Patient Account Number: [id number] Date of Birth/Sex: Treating RN: October 09, 1965 (57 y.o. F) Primary Care Franco Duley: Stoney Bang Other Clinician: Referring Jentzen Minasyan: Treating Koa Zoeller/Extender: Dallas Breeding in Treatment: 66 Visit Information History Since Last Visit Added or deleted any medications: No Patient Arrived: Ambulatory Any new allergies or adverse reactions: No Arrival Time: 11:09 Had a fall or experienced change in No Accompanied By: son activities of daily living that may affect Transfer Assistance: None risk of falls: Patient Identification Verified: Yes Signs or symptoms of abuse/neglect since last visito No Secondary Verification Process Completed: Yes Hospitalized since last visit: No Patient Requires Transmission-Based Precautions: No Implantable device outside of the clinic excluding No Patient Has Alerts: Yes cellular tissue based products placed in the center Patient Alerts: PICC in left arm since last visit: Has Dressing in Place as Prescribed: Yes Pain Present Now: No Electronic Signature(s) Signed: 10/09/2022 4:38:21 PM By: Erenest Blank Entered By: Erenest Blank on 10/09/2022 11:12:08 -------------------------------------------------------------------------------- Clinic Level of Care Assessment Details Patient Name: Date of Service: [name], [name] 10/09/2022 10:30 A M Medical Record Number: [id number] Patient Account Number: [id number] Date of Birth/Sex: Treating RN: 29-Oct-1965 (57 y.o. [name], [name] Primary Care Dorismar Chay: Stoney Bang Other Clinician: Referring Jolyn Deshmukh: Treating Ximena Todaro/Extender: Dallas Breeding in Treatment: 17 Clinic Level of Care Assessment Items TOOL 4  Quantity Score X- 1 0 Use when only an EandM is performed on FOLLOW-UP visit ASSESSMENTS - Nursing Assessment / Reassessment X- 1 10 Reassessment of Co-morbidities (includes updates in patient status) X- 1 5 Reassessment of Adherence to Treatment Plan ASSESSMENTS - Wound and Skin A ssessment / Reassessment X - Simple Wound Assessment / Reassessment - one wound 1 5 []  - 0 Complex Wound Assessment / Reassessment - multiple wounds []  - 0 Dermatologic / Skin Assessment (not related to wound area) ASSESSMENTS - Focused Assessment X- 1 5 Circumferential Edema Measurements - multi extremities []  - 0 Nutritional Assessment / Counseling / Intervention [name] ([id number] Page 2 of 7 []  - 0 Lower Extremity Assessment (monofilament, tuning fork, pulses) []  - 0 Peripheral Arterial Disease Assessment (using hand held doppler) ASSESSMENTS - Ostomy and/or Continence Assessment and Care []  - 0 Incontinence Assessment and Management []  - 0 Ostomy Care Assessment and Management (repouching, etc.) PROCESS - Coordination of Care X - Simple Patient / Family Education for ongoing care 1 15 []  - 0 Complex (extensive) Patient / Family Education for ongoing care X- 1 10 Staff obtains Programmer, systems, Records, T Results / Process Orders est []  - 0 Staff telephones HHA, Nursing Homes / Clarify orders / etc []  - 0 Routine Transfer to another Facility (non-emergent condition) []  - 0 Routine Hospital Admission (non-emergent condition) []  - 0 New Admissions / Biomedical engineer / Ordering NPWT Apligraf, etc. , []  - 0 Emergency Hospital Admission (emergent condition) X- 1 10 Simple Discharge Coordination []  - 0 Complex (extensive) Discharge Coordination PROCESS - Special Needs []  - 0 Pediatric / Minor Patient Management []  - 0 Isolation Patient Management []  - 0 Hearing / Language / Visual special needs []  - 0 Assessment of Community assistance  (transportation, D/C planning, etc.) []  - 0 Additional assistance / Altered mentation []  - 0 Support Surface(s) Assessment (bed, cushion, seat, etc.) INTERVENTIONS - Wound Cleansing / Measurement X - Simple Wound Cleansing -  one wound 1 5 []  - 0 Complex Wound Cleansing - multiple wounds X- 1 5 Wound Imaging (photographs - any number of wounds) []  - 0 Wound Tracing (instead of photographs) X- 1 5 Simple Wound Measurement - one wound []  - 0 Complex Wound Measurement - multiple wounds INTERVENTIONS - Wound Dressings X - Small Wound Dressing one or multiple wounds 1 10 []  - 0 Medium Wound Dressing one or multiple wounds []  - 0 Large Wound Dressing one or multiple wounds X- 1 5 Application of Medications - topical []  - 0 Application of Medications - injection INTERVENTIONS - Miscellaneous []  - 0 External ear exam []  - 0 Specimen Collection (cultures, biopsies, blood, body fluids, etc.) []  - 0 Specimen(s) / Culture(s) sent or taken to Lab for analysis []  - 0 Patient Transfer (multiple staff / Civil Service fast streamer / Similar devices) []  - 0 Simple Staple / Suture removal (25 or less) []  - 0 Complex Staple / Suture removal (26 or more) []  - 0 Hypo / Hyperglycemic Management (close monitor of Blood Glucose) [name], [name] ([id number] Page 3 of 7 []  - 0 Ankle / Brachial Index (ABI) - do not check if billed separately X- 1 5 Vital Signs Has the patient been seen at the hospital within the last three years: Yes Total Score: 95 Level Of Care: New/Established - Level 3 Electronic Signature(s) Signed: 10/09/2022 5:35:13 PM By: Rhae Hammock RN Entered By: Rhae Hammock on 10/09/2022 16:04:25 -------------------------------------------------------------------------------- Encounter Discharge Information Details Patient Name: Date of Service: [name], [name] 10/09/2022 10:30 A M Medical Record Number: [id number] Patient Account Number: [id number] Date  of Birth/Sex: Treating RN: [birth year] (57 y.o. [name], [name] Primary Care Edilberto Roosevelt: Stoney Bang Other Clinician: Referring [name]: Treating Elmin Wiederholt/Extender: Dallas Breeding in Treatment: [id number] Encounter Discharge Information Items Discharge Condition: Stable Ambulatory Status: Ambulatory Discharge Destination: Home Transportation: Private Auto Accompanied By: son Schedule Follow-up Appointment: Yes Clinical Summary of Care: Patient Declined Electronic Signature(s) Signed: 10/09/2022 5:35:13 PM By: Rhae Hammock RN Entered By: Rhae Hammock on 10/09/2022 16:04:57 -------------------------------------------------------------------------------- Lower Extremity Assessment Details Patient Name: Date of Service: [name], [name] 10/09/2022 10:30 A M Medical Record Number: [id number] Patient Account Number: [id number] Date of Birth/Sex: Treating RN: [birth year] (57 y.o. F) Primary Care Katalyn Matin: Stoney Bang Other Clinician: Referring Makayleigh Poliquin: Treating Brittiney Dicostanzo/Extender: Matt Holmes Weeks in Treatment: 17 Edema Assessment Assessed: [Left: No] [Right: No] Edema: [Left: Ye] [Right: s] Calf Left: Right: Point of Measurement: 32 cm From Medial Instep 59 cm Ankle Left: Right: Point of Measurement: 10 cm From Medial Instep 33 cm Electronic Signature(s) Signed: 10/09/2022 4:38:21 PM By: Kingsley Spittle, Priyah ([id number]) [id number] Page 4 of 7 Signed: 10/09/2022 4:38:21 PM By: Erenest Blank Entered By: Erenest Blank on 10/09/2022 11:18:55 -------------------------------------------------------------------------------- Multi-Disciplinary Care Plan Details Patient Name: Date of Service: IBIS, STEVEN 10/09/2022 10:30 A M Medical Record Number: [id number] Patient Account Number: [id number] Date of Birth/Sex: Treating RN: 15-Jul-1966 (57 y.o. [name], [name] Primary Care Breeze Berringer: Stoney Bang Other  Clinician: Referring Ara Grandmaison: Treating Ashanti Ratti/Extender: Dallas Breeding in Treatment: 17 Active Inactive Wound/Skin Impairment Nursing Diagnoses: Impaired tissue integrity Knowledge deficit related to ulceration/compromised skin integrity Goals: Patient will have a decrease in wound volume by X% from date: (specify in notes) Date Initiated: 06/12/2022 Target Resolution Date: 10/05/2022 Goal Status: Active Patient/caregiver will verbalize understanding of skin care regimen Date Initiated: 06/12/2022 Target Resolution Date: 10/05/2022 Goal Status: Active Ulcer/skin breakdown will have a volume reduction of 30% by  week 4 Date Initiated: 06/12/2022 Date Inactivated: 07/31/2022 Target Resolution Date: 08/03/2022 Goal Status: Unmet Unmet Reason: dx with osteomyelitis. Ulcer/skin breakdown will have a volume reduction of 50% by week 8 Date Initiated: 06/12/2022 Target Resolution Date: 10/05/2022 Goal Status: Active Interventions: Assess patient/caregiver ability to obtain necessary supplies Assess patient/caregiver ability to perform ulcer/skin care regimen upon admission and as needed Assess ulceration(s) every visit Notes: Electronic Signature(s) Signed: 10/09/2022 5:35:13 PM By: Rhae Hammock RN Entered By: Rhae Hammock on 10/09/2022 11:40:11 -------------------------------------------------------------------------------- Pain Assessment Details Patient Name: Date of Service: [name], [name] 10/09/2022 10:30 A M Medical Record Number: [id number] Patient Account Number: [id number] Date of Birth/Sex: Treating RN: [birth year] (57 y.o. F) Primary Care Cabela Pacifico: Stoney Bang Other Clinician: Referring Kymberlyn Eckford: Treating Blayke Pinera/Extender: Dallas Breeding in Treatment: 17 Active Problems Location of Pain Severity and Description of Pain Patient Has Bahar Shelden, Yatziri ([id number]) [id number] Page 5 of 7 Patient  Has Paino Yes Site Locations Pain Location: Pain in Ulcers Rate the pain. Current Pain Level: 6 Pain Management and Medication Current Pain Management: Electronic Signature(s) Signed: 10/09/2022 4:38:21 PM By: Erenest Blank Entered By: Erenest Blank on 10/09/2022 11:12:48 -------------------------------------------------------------------------------- Patient/Caregiver Education Details Patient Name: Date of Service: [name] [name] 1/3/2024andnbsp10:30 A M Medical Record Number: [id number] Patient Account Number: [id number] Date of Birth/Gender: Treating RN: 10/13/1965 (57 y.o. [name], [name] Primary Care Physician: Stoney Bang Other Clinician: Referring Physician: Treating Physician/Extender: Dallas Breeding in Treatment: 93 Education Assessment Education Provided To: Patient Education Topics Provided Wound/Skin Impairment: Methods: Explain/Verbal Responses: Reinforcements needed, State content correctly Electronic Signature(s) Signed: 10/09/2022 5:35:13 PM By: Rhae Hammock RN Entered By: Rhae Hammock on 10/09/2022 16:03:40 -------------------------------------------------------------------------------- Wound Assessment Details Patient Name: Date of Service: [name], [name] 10/09/2022 10:30 A M Medical Record Number: [id number] Patient Account Number: [id number] Date of Birth/Sex: Treating RN: Jan 08, 1966 (57 y.o. Amariyah Bazar, Zykira ([id number]) [id number] Page 6 of 7 Primary Care Yuette Putnam: Stoney Bang Other Clinician: Referring Shaquasha Gerstel: Treating Angeliki Mates/Extender: Dallas Breeding in Treatment: 17 Wound Status Wound Number: 1 Primary Etiology: Diabetic Wound/Ulcer of the Lower Extremity Wound Location: Right Calcaneus Wound Status: Open Wounding Event: Gradually Appeared Comorbid History: Hypertension, Type II Diabetes, Osteomyelitis Date Acquired: 11/07/2020 Weeks Of Treatment: 17 Clustered  Wound: Yes Photos Wound Measurements Length: (cm) Width: (cm) Depth: (cm) Clustered Quantity: Area: (cm) Volume: (cm) 8 % Reduction in Area: -89.2% 3.5 % Reduction in Volume: -183.7% 0.3 Epithelialization: Small (1-33%) 2 Tunneling: No 21.991 Undermining: No 6.597 Wound Description Classification: Grade 3 Wound Margin: Distinct, outline attached Exudate Amount: Large Exudate Type: Serosanguineous Exudate Color: red, brown Foul Odor After Cleansing: No Slough/Fibrino Yes Wound Bed Granulation Amount: Large (67-100%) Exposed Structure Granulation Quality: Red, Pink Fascia Exposed: No Necrotic Amount: Small (1-33%) Fat Layer (Subcutaneous Tissue) Exposed: Yes Necrotic Quality: Adherent Slough Tendon Exposed: No Muscle Exposed: No Joint Exposed: No Bone Exposed: Yes Periwound Skin Texture Texture Color No Abnormalities Noted: No No Abnormalities Noted: No Callus: No Atrophie Blanche: No Crepitus: No Cyanosis: No Excoriation: No Ecchymosis: No Induration: No Erythema: No Rash: No Hemosiderin Staining: No Scarring: No Mottled: No Pallor: No Moisture Rubor: No No Abnormalities Noted: No Dry / Scaly: No Temperature / Pain Maceration: No Temperature: No Abnormality Tenderness on Palpation: Yes Treatment Notes Wound #1 (Calcaneus) Wound Laterality: Right Cleanser Soap and Water Discharge Instruction: May shower and wash wound with dial antibacterial soap and water prior to dressing change. Wound Cleanser Puls, Shayma ([id number]) [id number] Page 7 of  7 Discharge Instruction: Cleanse the wound with wound cleanser prior to applying a clean dressing using gauze sponges, not tissue or cotton balls. Peri-Wound Care Triamcinolone 15 (g) Discharge Instruction: Use triamcinolone 15 (g) as directed Zinc Oxide Ointment 30g tube Discharge Instruction: Apply Zinc Oxide to periwound with each dressing change Sween Lotion (Moisturizing  lotion) Discharge Instruction: Apply moisturizing lotion as directed Topical Primary Dressing KerraCel Ag Gelling Fiber Dressing, 4x5 in (silver alginate) Discharge Instruction: Apply silver alginate to wound bed as instructed Secondary Dressing ABD Pad, 5x9 Discharge Instruction: Apply over primary dressing as directed. Drawtex 4x4 in Discharge Instruction: Apply over primary dressing as directed. Woven Gauze Sponge, Non-Sterile 4x4 in Discharge Instruction: Apply over primary dressing as directed. Secured With Elastic Bandage 4 inch (ACE bandage) Discharge Instruction: Secure with ACE bandage as directed. Kerlix Roll Sterile, 4.5x3.1 (in/yd) Discharge Instruction: Secure with Kerlix as directed. 16M Medipore H Soft Cloth Surgical T ape, 4 x 10 (in/yd) Discharge Instruction: Secure with tape as directed. Compression Wrap Compression Stockings Add-Ons Electronic Signature(s) Signed: 10/09/2022 4:38:21 PM By: Erenest Blank Entered By: Erenest Blank on 10/09/2022 11:21:03 -------------------------------------------------------------------------------- Vitals Details Patient Name: Date of Service: RENITA, BROCKS 10/09/2022 10:30 A M Medical Record Number: [id number] Patient Account Number: [id number] Date of Birth/Sex: Treating RN: Jun 30, 1966 (57 y.o. F) Primary Care Keyla Milone: Stoney Bang Other Clinician: Referring Eirene Rather: Treating Xxavier Noon/Extender: Dallas Breeding in Treatment: 17 Vital Signs Time Taken: 11:12 Temperature (F): 97.7 Height (in): 65 Pulse (bpm): 81 Weight (lbs): 331 Respiratory Rate (breaths/min): 18 Body Mass Index (BMI): 55.1 Blood Pressure (mmHg): 136/85 Reference Range: 80 - 120 mg / dl Electronic Signature(s) Signed: 10/09/2022 4:38:21 PM By: Erenest Blank Entered By: Erenest Blank on 10/09/2022 11:12:36

## 2022-10-15 NOTE — Progress Notes (Signed)
[name] ([id number]) [id number] Page 1 of 7 Visit Report for 09/25/2022 Arrival Information Details Patient Name: Date of Service: [name], [name] 09/25/2022 9:30 A M Medical Record Number: [id number] Patient Account Number: [id number] Date of Birth/Sex: Treating RN: 1966-05-04 (57 y.o. [name] Primary Care [name]: [name] Other Clinician: Referring Luisa Louk: Treating [name]/Extender: [name] in Treatment: 15 Visit Information History Since Last Visit Added or deleted any medications: Yes Patient Arrived: Ambulatory Any new allergies or adverse reactions: No Arrival Time: 09:29 Had a fall or experienced change in No Accompanied By: Son activities of daily living that may affect Transfer Assistance: None risk of falls: Patient Identification Verified: Yes Signs or symptoms of abuse/neglect since last visito No Secondary Verification Process Completed: Yes Hospitalized since last visit: No Patient Requires Transmission-Based Precautions: No Implantable device outside of the clinic excluding No Patient Has Alerts: Yes cellular tissue based products placed in the center Patient Alerts: PICC in left arm since last visit: Has Dressing in Place as Prescribed: Yes Pain Present Now: Yes Electronic Signature(s) Signed: 10/14/2022 5:14:49 PM By: Redmond Pulling RN, BSN Entered By: Redmond Pulling on 09/25/2022 09:39:07 -------------------------------------------------------------------------------- Clinic Level of Care Assessment Details Patient Name: Date of Service: [name] 09/25/2022 9:30 A M Medical Record Number: [id number] Patient Account Number: [id number] Date of Birth/Sex: Treating RN: 1966/05/10 (57 y.o. [name], [name]: [name] Other Clinician: Referring Blaire Palomino: Treating [name]/Extender: [name] in Treatment: 15 Clinic Level of Care  Assessment Items TOOL 4 Quantity Score X- 1 0 Use when only an EandM is performed on FOLLOW-UP visit ASSESSMENTS - Nursing Assessment / Reassessment X- 1 10 Reassessment of Co-morbidities (includes updates in patient status) X- 1 5 Reassessment of Adherence to Treatment Plan ASSESSMENTS - Wound and Skin A ssessment / Reassessment X - Simple Wound Assessment / Reassessment - one wound 1 5 []  - 0 Complex Wound Assessment / Reassessment - multiple wounds []  - 0 Dermatologic / Skin Assessment (not related to wound area) ASSESSMENTS - Focused Assessment X- 1 5 Circumferential Edema Measurements - multi extremities []  - 0 Nutritional Assessment / Counseling / Intervention Deeg, [name] (  .pdf Page 2 of 7 []  - 0 Lower Extremity Assessment (monofilament, tuning fork, pulses) []  - 0 Peripheral Arterial Disease Assessment (using hand held doppler) ASSESSMENTS - Ostomy and/or Continence Assessment and Care []  - 0 Incontinence Assessment and Management []  - 0 Ostomy Care Assessment and Management (repouching, etc.) PROCESS - Coordination of Care X - Simple Patient / Family Education for ongoing care 1 15 []  - 0 Complex (extensive) Patient / Family Education for ongoing care X- 1 10 Staff obtains , Records, T Results / Process Orders est []  - 0 Staff telephones HHA, Nursing Homes / Clarify orders / etc []  - 0 Routine Transfer to another Facility (non-emergent condition) []  - 0 Routine Hospital Admission (non-emergent condition) []  - 0 New Admissions / [id number] / Ordering NPWT Apligraf, etc. , []  - 0 Emergency Hospital Admission (emergent condition) X- 1 10 Simple Discharge Coordination []  - 0 Complex (extensive) Discharge Coordination PROCESS - Special Needs []  - 0 Pediatric / Minor Patient Management []  - 0 Isolation Patient Management []  - 0 Hearing / Language / Visual special needs []  - 0 Assessment of  Community assistance (transportation, D/C planning, etc.) []  - 0 Additional assistance / Altered mentation []  - 0 Support Surface(s) Assessment (bed, cushion, seat, etc.) INTERVENTIONS - Wound Cleansing / Measurement X -  Simple Wound Cleansing - one wound 1 5 []  - 0 Complex Wound Cleansing - multiple wounds X- 1 5 Wound Imaging (photographs - any number of wounds) []  - 0 Wound Tracing (instead of photographs) X- 1 5 Simple Wound Measurement - one wound []  - 0 Complex Wound Measurement - multiple wounds INTERVENTIONS - Wound Dressings X - Small Wound Dressing one or multiple wounds 1 10 []  - 0 Medium Wound Dressing one or multiple wounds []  - 0 Large Wound Dressing one or multiple wounds X- 1 5 Application of Medications - topical []  - 0 Application of Medications - injection INTERVENTIONS - Miscellaneous []  - 0 External ear exam []  - 0 Specimen Collection (cultures, biopsies, blood, body fluids, etc.) []  - 0 Specimen(s) / Culture(s) sent or taken to Lab for analysis []  - 0 Patient Transfer (multiple staff / / Similar devices) []  - 0 Simple Staple / Suture removal (25 or less) []  - 0 Complex Staple / Suture removal (26 or more) []  - 0 Hypo / Hyperglycemic Management (close monitor of Blood Glucose) [name], [name] (  .pdf Page 3 of 7 []  - 0 Ankle / Brachial Index (ABI) - do not check if billed separately X- 1 5 Vital Signs Has the patient been seen at the hospital within the last three years: Yes Total Score: 95 Level Of Care: New/Established - Level 3 Electronic Signature(s) Signed: 09/25/2022 5:39:23 PM By: RN Entered By: on 09/25/2022 10:29:38 -------------------------------------------------------------------------------- Complex / Palliative Patient Assessment Details Patient Name: Date of Service: [name], [name] 09/25/2022 9:30 A M Medical Record Number: Patient  Account Number: Nurse, adult Date of Birth/Sex: Treating RN: Sep 20, 1966 (57 y.o. Primary Care Sandford Diop: [id number] Other Clinician: Referring Quinetta Shilling: Treating Chany Woolworth/Extender: ) [id number] in Treatment: 15 Complex Wound Management Criteria Patient has remarkable or complex co-morbidities requiring medications or treatments that extend wound healing times. Examples: Diabetes mellitus with chronic renal failure or end stage renal disease requiring dialysis Advanced or poorly controlled rheumatoid arthritis Diabetes mellitus and end stage chronic obstructive pulmonary disease Active cancer with current chemo- or radiation therapy osteomyelitis, DM type II, morbid obesity, HTN, right toe amputation Palliative Wound Management Criteria Care Approach Wound Care Plan: Complex Wound Management Electronic Signature(s) Signed: 10/04/2022 4:13:10 PM By: 09/27/2022 RN, BSN Signed: 10/09/2022 4:16:52 PM By: Fonnie Mu PA-C Entered By: 09/27/2022 on 10/04/2022 16:13:10 -------------------------------------------------------------------------------- Encounter Discharge Information Details Patient Name: Date of Service: [name], [name] 09/25/2022 9:30 A M Medical Record Number: [id number] Patient Account Number: 12/09/1965 Date of Birth/Sex: Treating RN: [birth year] (57 y.o. [name], [name] Primary Care Khamari Yousuf: [name] Other Clinician: Referring Embry Manrique: Treating Ayshia Gramlich/Extender: 10/06/2022 in Treatment: 15 Encounter Discharge Information Items Discharge Condition: Stable Ambulatory Status: Ambulatory Discharge Destination: Home Transportation: Private Auto Accompanied By: son Schedule Follow-up Appointment: Yes Clinical Summary of Care: Patient Declined Electronic Signature(s) Signed: 09/25/2022 5:39:23 PM By: 12/08/2022 RN Szeliga, Mitsy (Lenda Kelp) [phone].pdf Page 4 of  7 Signed: 09/25/2022 5:39:23 PM By: Darral Dash RN Entered By: 09/27/2022 on 09/25/2022 10:30:18 -------------------------------------------------------------------------------- Lower Extremity Assessment Details Patient Name: Date of Service: [name], [name] 09/25/2022 9:30 A M Medical Record Number: [id number] Patient Account Number: [name] Date of Birth/Sex: Treating RN: Nov 07, 1965 (57 y.o. 09/27/2022 Primary Care Kandra Graven: Fonnie Mu Other Clinician: Referring Wrigley Winborne: Treating Elanda Garmany/Extender: [id number] in Treatment: 15 Edema Assessment Assessed: [Left: No] [Right: No] Edema: [Left: Ye] [  Right: s] Calf Left: Right: Point of Measurement: 32 cm From Medial Instep 59.5 cm Ankle Left: Right: Point of Measurement: 10 cm From Medial Instep 33 cm Vascular Assessment Pulses: Dorsalis Pedis Palpable: [Right:Yes] Electronic Signature(s) Signed: 10/14/2022 5:14:49 PM By: Sharyn Creamer RN, BSN Entered By: Sharyn Creamer on 09/25/2022 09:34:31 -------------------------------------------------------------------------------- Multi-Disciplinary Care Plan Details Patient Name: Date of Service: [name], [name] 09/25/2022 9:30 A M Medical Record Number: [id number] Patient Account Number: [id number] Date of Birth/Sex: Treating RN: [birth year] (57 y.o. [name], [name] Primary Care Miran Kautzman: Stoney Bang Other Clinician: Referring Alyna Stensland: Treating Verlie Hellenbrand/Extender: Dallas Breeding in Treatment: 15 Active Inactive Wound/Skin Impairment Nursing Diagnoses: Impaired tissue integrity Knowledge deficit related to ulceration/compromised skin integrity Goals: Patient will have a decrease in wound volume by X% from date: (specify in notes) Date Initiated: 06/12/2022 T arget Resolution Date: 10/05/2022 Goal Status: Active [name], [name] ([id number]) [id number] Page 5 of 7 Patient/caregiver will  verbalize understanding of skin care regimen Date Initiated: 06/12/2022 Target Resolution Date: 10/05/2022 Goal Status: Active Ulcer/skin breakdown will have a volume reduction of 30% by week 4 Date Initiated: 06/12/2022 Date Inactivated: 07/31/2022 Target Resolution Date: 08/03/2022 Goal Status: Unmet Unmet Reason: dx with osteomyelitis. Ulcer/skin breakdown will have a volume reduction of 50% by week 8 Date Initiated: 06/12/2022 Target Resolution Date: 10/05/2022 Goal Status: Active Interventions: Assess patient/caregiver ability to obtain necessary supplies Assess patient/caregiver ability to perform ulcer/skin care regimen upon admission and as needed Assess ulceration(s) every visit Notes: Electronic Signature(s) Signed: 09/25/2022 5:39:23 PM By: Rhae Hammock RN Entered By: Rhae Hammock on 09/25/2022 09:59:57 -------------------------------------------------------------------------------- Pain Assessment Details Patient Name: Date of Service: [name], [name] 09/25/2022 9:30 A M Medical Record Number: [id number] Patient Account Number: [id number] Date of Birth/Sex: Treating RN: 01/22/1966 (57 y.o. Donalda Ewings Primary Care Tonye Tancredi: Stoney Bang Other Clinician: Referring Jaxxon Naeem: Treating Gladine Plude/Extender: Dallas Breeding in Treatment: 15 Active Problems Location of Pain Severity and Description of Pain Patient Has Paino Yes Site Locations Rate the pain. Current Pain Level: 9 Character of Pain Describe the Pain: Cramping Pain Management and Medication Current Pain Management: Electronic Signature(s) Signed: 10/14/2022 5:14:49 PM By: Sharyn Creamer RN, BSN Entered By: Sharyn Creamer on 09/25/2022 09:32:18 [name], [name] ([id number]) [id number] Page 6 of 7 -------------------------------------------------------------------------------- Patient/Caregiver Education Details Patient Name: Date of Service: [name], [name]  12/20/2023andnbsp9:30 [place] Record Number: [id number] Patient Account Number: [id number] Date of Birth/Gender: Treating RN: 04-07-1966 (57 y.o. [name], [name] Primary Care Physician: Stoney Bang Other Clinician: Referring Physician: Treating Physician/Extender: Dallas Breeding in Treatment: 15 Education Assessment Education Provided To: Patient Education Topics Provided Wound/Skin Impairment: Methods: Explain/Verbal Responses: Reinforcements needed, State content correctly Electronic Signature(s) Signed: 09/25/2022 5:39:23 PM By: Rhae Hammock RN Entered By: Rhae Hammock on 09/25/2022 10:23:49 -------------------------------------------------------------------------------- Wound Assessment Details Patient Name: Date of Service: [name], [name] 09/25/2022 9:30 A M Medical Record Number: [id number] Patient Account Number: [id number] Date of Birth/Sex: Treating RN: 1966-09-08 (57 y.o. Donalda Ewings Primary Care Shemiah Rosch: Stoney Bang Other Clinician: Referring Labella Zahradnik: Treating Trevaun Rendleman/Extender: Dallas Breeding in Treatment: 15 Wound Status Wound Number: 1 Primary Etiology: Diabetic Wound/Ulcer of the Lower Extremity Wound Location: Right Calcaneus Wound Status: Open Wounding Event: Gradually Appeared Comorbid History: Hypertension, Type II Diabetes, Osteomyelitis Date Acquired: 11/07/2020 Weeks Of Treatment: 15 Clustered Wound: No Photos Wound Measurements Length: (cm) 8.8 Width: (cm) 2.5 [name], Batul ([id number]) Depth: (cm) 0.4 Area: (cm) 17.27 Volume: (cm) 6.912 % Reduction in Area: -  48.6% % Reduction in Volume: -197.3% [id number] Page 7 of 7 Epithelialization: Small (1-33%) 9 Tunneling: No Undermining: No Wound Description Classification: Grade 3 Wound Margin: Distinct, outline attached Exudate Amount: Large Exudate Type: Serosanguineous Exudate Color: red, brown Foul  Odor After Cleansing: No Slough/Fibrino Yes Wound Bed Granulation Amount: Large (67-100%) Exposed Structure Granulation Quality: Red, Pink Fascia Exposed: No Necrotic Amount: Small (1-33%) Fat Layer (Subcutaneous Tissue) Exposed: Yes Necrotic Quality: Adherent Slough Tendon Exposed: No Muscle Exposed: No Joint Exposed: No Bone Exposed: Yes Periwound Skin Texture Texture Color No Abnormalities Noted: No No Abnormalities Noted: No Callus: No Atrophie Blanche: No Crepitus: No Cyanosis: No Excoriation: No Ecchymosis: No Induration: No Erythema: Yes Rash: No Erythema Location: Circumferential Scarring: No Erythema Change: No Change Hemosiderin Staining: No Moisture Mottled: No No Abnormalities Noted: No Pallor: No Dry / Scaly: No Rubor: No Maceration: No Temperature / Pain Temperature: No Abnormality Tenderness on Palpation: Yes Electronic Signature(s) Signed: 10/14/2022 5:14:49 PM By: Redmond Pulling RN, BSN Entered By: Redmond Pulling on 09/25/2022 09:37:43 -------------------------------------------------------------------------------- Vitals Details Patient Name: Date of Service: [name], [name] 09/25/2022 9:30 A M Medical Record Number: [id number] Patient Account Number: [id number] Date of Birth/Sex: Treating RN: 1966/05/17 (57 y.o. [name] Primary Care Naraly Fritcher: [name] Other Clinician: Referring Tynleigh Birt: Treating Joan Herschberger/Extender: [name] in Treatment: 15 Vital Signs Time Taken: 09:31 Temperature (F): 97.6 Height (in): 65 Pulse (bpm): 89 Weight (lbs): 331 Respiratory Rate (breaths/min): 18 Body Mass Index (BMI): 55.1 Blood Pressure (mmHg): 138/83 Reference Range: 80 - 120 mg / dl Electronic Signature(s) Signed: 10/14/2022 5:14:49 PM By: Redmond Pulling RN, BSN Entered By: Redmond Pulling on 09/25/2022 09:32:02

## 2022-10-16 ENCOUNTER — Encounter (HOSPITAL_BASED_OUTPATIENT_CLINIC_OR_DEPARTMENT_OTHER): Payer: No Typology Code available for payment source | Admitting: Physician Assistant

## 2022-10-16 DIAGNOSIS — E11621 Type 2 diabetes mellitus with foot ulcer: Secondary | ICD-10-CM | POA: Diagnosis not present

## 2022-10-16 NOTE — Progress Notes (Signed)
[name] ([id number]) [id number] Page 1 of 8 Visit Report for 10/16/2022 Chief Complaint Document Details Patient Name: Date of Service: [name], [name] 10/16/2022 10:30 A M Medical Record Number: [id number] Patient Account Number: [id number] Date of Birth/Sex: Treating RN: 1965/11/14 (57 y.o. F) Primary Care Provider: Lia [name] Other Clinician: Referring Provider: Treating Provider/Extender: [name] in Treatment: 18 Information Obtained from: Patient Chief Complaint Right heel ulcer Electronic Signature(s) Signed: 10/16/2022 10:50:56 AM By: [name] Kelp PA-C Entered By: [name] on 10/16/2022 10:50:55 -------------------------------------------------------------------------------- HPI Details Patient Name: Date of Service: [name], [name] 10/16/2022 10:30 A M Medical Record Number: [id number] Patient Account Number: [id number] Date of Birth/Sex: Treating RN: March 20, 1966 (57 y.o. F) Primary Care Provider: Lia [name] Other Clinician: Referring Provider: Treating Provider/Extender: [name] in Treatment: 18 History of Present Illness HPI Description: 06-13-2022 upon evaluation today patient presents for evaluation of her right heel ulcer. She is having a tremendous amount of pain at this location. She tells me that her most recent hemoglobin A1c was 8.7 and that was on 08-23-2021. This ulcer on the heel has been present she states since February 2023 when she had an injection to the heel by podiatry. She states that she began to have increasing pain the wound opened and has been open ever since. 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 She had an x-ray in June if anything can although there did not appear to be any obvious evidence of osteomyelitis at that point. She subsequently has had OR debridement several times of the wound performed by podiatry. Patient does have  a history of lymphedema of the lower extremities she is also a type II diabetic. 06-19-2022 upon evaluation today patient appears to be doing better in regard to the size of her leg which is significantly improved. With that being said she had a lot of drainage from the heel which is completely understandable considering what is going on at this time. There does not appear to be any evidence of active infection there is no warmth or irritation to the leg in general. With that being said the patient does have an issue here with the amount of drainage that is going on I definitely think Zetuvit is good to be the better way to go in place of ABD pads. Also think that she could potentially benefit from 3 times per week dressing changes but again right now we are going to stick with the to change it today, change in her Friday, and then subsequently seeing where things stand next Wednesday. The other option would be to change her to a different day for wound care having her come then say like a Tuesday Friday or Monday Thursday. 06-26-2022 upon evaluation patient's wound bed actually showed signs of doing well in regard to the overall size it was measuring slightly smaller. With that being said unfortunately the biggest issue we see is she still has a tremendous amount of drainage which is what could keep this from being able to use a total contact cast. For that reason I am did going to discuss with her today the possibility of trying Tubigrip to see if that could be of benefit for her. She voiced understanding and is in agreement with giving this a try. 07-03-2022 upon evaluation today patient unfortunately appears to be doing significantly worse at this point in regard to her swelling due to the fact that she was unable to really  wear the Tubigrip. She tells me that it was cutting into her. With that being said she also did have an MRI this MRI revealed that she does have osteomyelitis in the heel this was  actually just performed Monday, 25 September. This does show signs of "early osteomyelitis". Nonetheless this is still concerning to me. The wound also appears to be getting deeper in the center aspect of this which has me a little concerned as well. I do believe that she is likely going require an aggressive approach here to try to get things better. I discussed that in greater detail with her today. I will detailed in the plan. 07-17-2022 upon evaluation today patient appears to be doing poorly in regard to her wound. Unfortunately she does not show any signs of infection systemically though locally this seems to be doing worse with the wound actually being bigger than where it was previous. She did have an MRI that showed evidence of osteomyelitis actually recommended a referral to ID she was evaluated infectious disease and they recommended referral to podiatry and to be honest have recommended amputation based on what the patient tells me today. With that being said this is something that she is not interested in at all to be [name] ([id number]) [id number] Page 2 of 8 perfectly honest. For that reason she wants to know what she should do and where she should go at this point that is where the majority of the conversation which was quite lengthy today went. Obviously understand her concern here but I think she is going to have to definitely get off this and likely this means she is going to need to come out of work. 10/18; Since the patient was the patient is using Hydrofera Blue on her right heel wound. She has a new larger open area superior to the wound. The skin on the bottom of her foot is completely macerated. I reviewed the infectious disease note on this patient from 10/9. They recommended keeping the patient on Delofloxacin 450 twice daily until follow-up tomorrow. The patient states she is not going back there as the only thing they wanted to do was "cut  my leg off]. She has not seen podiatry. Her lab works shows a CRP markedly elevated at 240.2 a sedimentation rate of 96 the rest of her CBC is normal creatinine of 0.38 The patient has been to see her primary doctor who is Dr. Leonides Schanz [SPo] In [place]. According the patient Dr. Loralee Pacas has ordered vancomycin and Zosyn to start on November 1. She is not taking the delofloxacin that was suggested by infectious disease. Very angry that they just wanted to consider her for an amputation although I do not specifically see that stated in their note 07-31-2022 upon evaluation today patient still has a significant wound over her plantar aspect of her right l foot region. With that being said I am definitely concerned about the fact that the patient probably does need to be in hyperbaric oxygen therapy at this point. I think we should try to get this going as quickly as possible. She actually is going to be set up with a PICC line next Wednesday and then following this we will have the vancomycin and Zosyn that she will be taking for 8 weeks according to what she tells me. I think this is definitely going to be beneficial and again the goal here is limb salvage. In combination with that I think the hyperbaric oxygen therapy would be ultimately significantly  helpful for her. 08-07-2022 upon evaluation today patient appears to still be doing quite poorly overall she still has not gotten her antibiotics. She got her PICC line today which I assumed she will be getting the first dose of antibiotics as well during that time. With that being said unfortunately she did not get the antibiotics and in fact as I questioned her further she does not even know when or if that is going to be started this week. Again I am very concerned about this considering she already has a PICC line in place we do not want this to clot off on top of the fact that she should already be on these antibiotics she just never went to the ER  for evaluation therefore they never got this started. At this point based on what I am seeing I really think that the ideal thing would be for [place] to see about getting her to the ER for further evaluation and treatment to see if they can get this started for her as soon as possible. The patient voiced understanding and is in agreement with the plan. I gave her recommendations for what should be done also called [facility] infusion therapy in order to see if they would be the ones that seem to be available for her IV infusions but they tell me that they no longer do this that is apparently where the orders were sent to for her infusion therapy. That was by her primary care provider. 08-21-2022 upon evaluation today patient's wound on the bottom of the heel and foot area as well as the toe appear to be doing significantly better. Fortunately I do not see any evidence of infection locally or systemically and everything is measuring much smaller than where we have been. The wound in the left gluteal region also is dramatically improved compared to last time she was here. She has started the antibiotics this is cefepime and vancomycin. She did have a somewhat high trough and therefore they have her hold that today and they are to recheck in the morning I believe she told me. Nonetheless with the antibiotics going she seems to be doing significantly better which is great news. 08-28-2022 upon evaluation today patient appears to be doing somewhat better in regard to her foot there is definitely less drainage than what we have seen in the past. I do believe the antibiotics are helping. With that being said she is also been approved for hyperbaric oxygen therapy and I think as soon as we get this started the better. I discussed that with her today as well. With that being said I do think that the last thing she needs is her chest x-ray when she has that done will be ready to start her into treatment. She is going to  try to go get that today when she leaves which I think would be ideal. 09-11-2022 upon evaluation today patient appears to be doing well with regard to her foot ulcer. This is showing signs of no digression and she does not appear to be having as much drainage as before though it still very wet is not nearly the extreme of what it was previous. Fortunately there does not appear to be any signs of active infection locally nor systemically at this point which is great news. Patient did have her chest x-ray as well as EKG and she is actually approved and completely cleared for HBO therapy from both a clinical standpoint as well as an insurance standpoint.  09-25-2022 upon evaluation today patient appears to be doing well in regard to her foot ulcer. Unfortunately she is not doing as well when it comes to her hyperbaric treatments. She is having a lot of trouble with claustrophobia. She tells me in general that she is not sure she is good to be able to continue. 10-09-2022 upon evaluation today patient's wounds actually are showing signs of excellent improvement which is great news. Fortunately I do not see any evidence of infection locally or systemically which is great news and overall I am extremely pleased with where things are currently. I do think that she is making good progress she does tell me at this point today she does not want to go forward with a hyperbaric oxygen therapy. 10-16-2022 upon evaluation today patient appears to be doing excellent in regard to her foot ulcer. This is actually showing signs of excellent improvement. She still does not allow for any sharp debridement but the good news is this is doing much better and does not even need it at the moment. Nonetheless as far as sharp debridement is concerned this has been too painful for really not been able to do that all along. Electronic Signature(s) Signed: 10/16/2022 11:21:02 AM By: [name] KelpStone III, Ashely Goosby PA-C Entered By: [name] KelpStone III, Nur Krasinski on  10/16/2022 11:21:02 -------------------------------------------------------------------------------- Physical Exam Details Patient Name: Date of Service: [name], [name] 10/16/2022 10:30 A M Medical Record Number: [id number] Patient Account Number: [id number] Date of Birth/Sex: Treating RN: 05/02/1966 (57 y.o. F) Primary Care Provider: Lia HoppingHasanaj, Xaje Other Clinician: Referring Provider: Treating Provider/Extender: [name] MontanaStone III, Cecilia Vancleve Hasanaj, Xaje Weeks in Treatment: 18 Constitutional Obese and well-hydrated in no acute distress. Respiratory Quinter, Ijeoma ([id number]) [id number] Page 3 of 8 normal breathing without difficulty. Psychiatric this patient is able to make decisions and demonstrates good insight into disease process. Alert and Oriented x 3. pleasant and cooperative. Notes Upon inspection patient's wound bed actually showed signs of good granulation epithelization at this point. Fortunately there does not appear to be any signs of active infection locally or systemically which is great news. With that being said she is doing much better. I actually feel like her wound is significantly smaller and she has a lot of good epithelial growth even in the central part of the wound where she can separate this and to which I think will make it much easier for measurement going forward. Electronic Signature(s) Signed: 10/16/2022 11:21:31 AM By: [name] KelpStone III, Pierre Dellarocco PA-C Entered By: [name] KelpStone III, Rayquan Amrhein on 10/16/2022 11:21:31 -------------------------------------------------------------------------------- Physician Orders Details Patient Name: Date of Service: [name], [name] 10/16/2022 10:30 A M Medical Record Number: [id number] Patient Account Number: [id number] Date of Birth/Sex: Treating RN: 08/06/1966 (57 y.o. Debara PickettF) Deaton, Millard.LoaBobbi Primary Care Provider: Lia HoppingHasanaj, Xaje Other Clinician: Referring Provider: Treating Provider/Extender: [name] MontanaStone III, Carmen Vallecillo Hasanaj, Xaje Weeks in Treatment:  2918 Verbal / Phone Orders: No Diagnosis Coding ICD-10 Coding Code Description [phone] Other chronic osteomyelitis, right ankle and foot E11.621 Type 2 diabetes mellitus with foot ulcer L97.412 Non-pressure chronic ulcer of right heel and midfoot with fat layer exposed I89.0 Lymphedema, not elsewhere classified Follow-up Appointments ppointment in 1 week. Leonard Schwartz- Kennidy Lamke Wednesday 10/23/2022 1000 Return A ppointment in 2 weeks. Leonard Schwartz- Imad Shostak Wednesday 10/23/2022 0930 Return A Anesthetic (In clinic) Topical Lidocaine 5% applied to wound bed Bathing/ Shower/ Hygiene May shower with protection but do not get wound dressing(s) wet. Protect dressing(s) with water repellant cover (for example, large plastic bag) or a cast cover and may then take shower. Edema Control -  Lymphedema / SCD / Other Elevate legs to the level of the heart or above for 30 minutes daily and/or when sitting for 3-4 times a day throughout the day. Avoid standing for long periods of time. Off-Loading Open toe surgical shoe to: - w/ peg assist Other: - Minimize walking and standing as much as possible to right foot to aid in offloading to wound. While at work use the wheelchair for mobility. Wound Treatment Wound #1 - Calcaneus Wound Laterality: Right Cleanser: Soap and Water 1 x Per Day/15 Days Discharge Instructions: May shower and wash wound with dial antibacterial soap and water prior to dressing change. Cleanser: Wound Cleanser (Generic) 1 x Per Day/15 Days Discharge Instructions: Cleanse the wound with wound cleanser prior to applying a clean dressing using gauze sponges, not tissue or cotton balls. Peri-Wound Care: Sween Lotion (Moisturizing lotion) 1 x Per Day/15 Days Discharge Instructions: Apply moisturizing lotion as directed Prim Dressing: KerraCel Ag Gelling Fiber Dressing, 4x5 in (silver alginate) (Generic) 1 x Per Day/15 Days ary Discharge Instructions: Apply silver alginate to wound bed as instructed [name], [name]  ([id number]) [id number] Page 4 of 8 Secondary Dressing: ABD Pad, 5x9 (DME) (Generic) 1 x Per Day/15 Days Discharge Instructions: Apply over primary dressing as directed. Secondary Dressing: Woven Gauze Sponge, Non-Sterile 4x4 in (Generic) 1 x Per Day/15 Days Discharge Instructions: Apply over primary dressing as directed. Secured With: Insurance underwriter, Sterile 2x75 (in/in) (DME) (Generic) 1 x Per Day/15 Days Discharge Instructions: Secure with stretch gauze as directed. Secured With: 17M Medipore H Soft Cloth Surgical T ape, 4 x 10 (in/yd) (Generic) 1 x Per Day/15 Days Discharge Instructions: Secure with tape as directed. Wound #4 - Foot Wound Laterality: Plantar, Right, Distal Cleanser: Soap and Water 1 x Per Day/15 Days Discharge Instructions: May shower and wash wound with dial antibacterial soap and water prior to dressing change. Cleanser: Wound Cleanser (Generic) 1 x Per Day/15 Days Discharge Instructions: Cleanse the wound with wound cleanser prior to applying a clean dressing using gauze sponges, not tissue or cotton balls. Peri-Wound Care: Sween Lotion (Moisturizing lotion) 1 x Per Day/15 Days Discharge Instructions: Apply moisturizing lotion as directed Prim Dressing: KerraCel Ag Gelling Fiber Dressing, 4x5 in (silver alginate) (Generic) 1 x Per Day/15 Days ary Discharge Instructions: Apply silver alginate to wound bed as instructed Secondary Dressing: ABD Pad, 5x9 (Generic) 1 x Per Day/15 Days Discharge Instructions: Apply over primary dressing as directed. Secondary Dressing: Woven Gauze Sponge, Non-Sterile 4x4 in (Generic) 1 x Per Day/15 Days Discharge Instructions: Apply over primary dressing as directed. Secured With: Insurance underwriter, Sterile 2x75 (in/in) (Generic) 1 x Per Day/15 Days Discharge Instructions: Secure with stretch gauze as directed. Secured With: 17M Medipore H Soft Cloth Surgical T ape, 4 x 10 (in/yd)  (Generic) 1 x Per Day/15 Days Discharge Instructions: Secure with tape as directed. Electronic Signature(s) Signed: 10/16/2022 4:42:12 PM By: [name] Kelp PA-C Signed: 10/16/2022 5:22:16 PM By: Shawn Stall RN, BSN Entered By: Shawn Stall on 10/16/2022 11:12:12 -------------------------------------------------------------------------------- Problem List Details Patient Name: Date of Service: ACASIA, SKILTON 10/16/2022 10:30 A M Medical Record Number: [id number] Patient Account Number: [id number] Date of Birth/Sex: Treating RN: May 10, 1966 (57 y.o. F) Primary Care Provider: Lia [name] Other Clinician: Referring Provider: Treating Provider/Extender: [name] in Treatment: 18 Active Problems ICD-10 Encounter Code Description Active Date MDM Diagnosis [phone] Other chronic osteomyelitis, right ankle and foot 08/28/2022 No Yes E11.621 Type 2 diabetes mellitus with foot ulcer  06/12/2022 No Yes L97.412 Non-pressure chronic ulcer of right heel and midfoot with fat layer exposed 06/12/2022 No Yes Senseney, Dacia ([id number]) [id number] Page 5 of 8 I89.0 Lymphedema, not elsewhere classified 06/12/2022 No Yes Inactive Problems Resolved Problems Electronic Signature(s) Signed: 10/16/2022 10:50:42 AM By: Worthy Keeler PA-C Entered By: Worthy Keeler on 10/16/2022 10:50:42 -------------------------------------------------------------------------------- Progress Note Details Patient Name: Date of Service: [name], [name] 10/16/2022 10:30 A M Medical Record Number: [id number] Patient Account Number: [id number] Date of Birth/Sex: Treating RN: 1966-06-30 (57 y.o. F) Primary Care Provider: Stoney Bang Other Clinician: Referring Provider: Treating Provider/Extender: Dallas Breeding in Treatment: 18 Subjective Chief Complaint Information obtained from Patient Right heel ulcer History of Present Illness (HPI) 06-13-2022 upon  evaluation today patient presents for evaluation of her right heel ulcer. She is having a tremendous amount of pain at this location. She tells me that her most recent hemoglobin A1c was 8.7 and that was on 08-23-2021. This ulcer on the heel has been present she states since February 2023 when she had an injection to the heel by podiatry. She states that she began to have increasing pain the wound opened and has been open ever since. 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 She had an x-ray in June if anything can although there did not appear to be any obvious evidence of osteomyelitis at that point. She subsequently has had OR debridement several times of the wound performed by podiatry. Patient does have a history of lymphedema of the lower extremities she is also a type II diabetic. 06-19-2022 upon evaluation today patient appears to be doing better in regard to the size of her leg which is significantly improved. With that being said she had a lot of drainage from the heel which is completely understandable considering what is going on at this time. There does not appear to be any evidence of active infection there is no warmth or irritation to the leg in general. With that being said the patient does have an issue here with the amount of drainage that is going on I definitely think Zetuvit is good to be the better way to go in place of ABD pads. Also think that she could potentially benefit from 3 times per week dressing changes but again right now we are going to stick with the to change it today, change in her Friday, and then subsequently seeing where things stand next Wednesday. The other option would be to change her to a different day for wound care having her come then say like a Tuesday Friday or Monday Thursday. 06-26-2022 upon evaluation patient's wound bed actually showed signs of doing well in regard to the overall size it was measuring slightly smaller.  With that being said unfortunately the biggest issue we see is she still has a tremendous amount of drainage which is what could keep this from being able to use a total contact cast. For that reason I am did going to discuss with her today the possibility of trying Tubigrip to see if that could be of benefit for her. She voiced understanding and is in agreement with giving this a try. 07-03-2022 upon evaluation today patient unfortunately appears to be doing significantly worse at this point in regard to her swelling due to the fact that she was unable to really wear the Tubigrip. She tells me that it was cutting into her. With that being said she also did have an MRI this  MRI revealed that she does have osteomyelitis in the heel this was actually just performed Monday, 25 September. This does show signs of "early osteomyelitis". Nonetheless this is still concerning to me. The wound also appears to be getting deeper in the center aspect of this which has me a little concerned as well. I do believe that she is likely going require an aggressive approach here to try to get things better. I discussed that in greater detail with her today. I will detailed in the plan. 07-17-2022 upon evaluation today patient appears to be doing poorly in regard to her wound. Unfortunately she does not show any signs of infection systemically though locally this seems to be doing worse with the wound actually being bigger than where it was previous. She did have an MRI that showed evidence of osteomyelitis actually recommended a referral to ID she was evaluated infectious disease and they recommended referral to podiatry and to be honest have recommended amputation based on what the patient tells me today. With that being said this is something that she is not interested in at all to be perfectly honest. For that reason she wants to know what she should do and where she should go at this point that is where the majority of  the conversation which was quite lengthy today went. Obviously understand her concern here but I think she is going to have to definitely get off this and likely this means she is going to need to come out of work. 10/18; Since the patient was the patient is using Hydrofera Blue on her right heel wound. She has a new larger open area superior to the wound. The skin on the bottom of her foot is completely macerated. I reviewed the infectious disease note on this patient from 10/9. They recommended keeping the patient on Delofloxacin 450 twice daily until follow-up tomorrow. The patient states she is not going back there as the only thing they wanted to do was "cut my leg off]. She has not seen podiatry. Her lab works [facility], Arline [name] ([id number]) [id number] Page 6 of 8 shows a CRP markedly elevated at 240.2 a sedimentation rate of 96 the rest of her CBC is normal creatinine of 0.38 The patient has been to see her primary doctor who is Dr. Leonides Schanz [SPo] In [place]. According the patient Dr. Loralee Pacas has ordered vancomycin and Zosyn to start on November 1. She is not taking the delofloxacin that was suggested by infectious disease. Very angry that they just wanted to consider her for an amputation although I do not specifically see that stated in their note 07-31-2022 upon evaluation today patient still has a significant wound over her plantar aspect of her right l foot region. With that being said I am definitely concerned about the fact that the patient probably does need to be in hyperbaric oxygen therapy at this point. I think we should try to get this going as quickly as possible. She actually is going to be set up with a PICC line next Wednesday and then following this we will have the vancomycin and Zosyn that she will be taking for 8 weeks according to what she tells me. I think this is definitely going to be beneficial and again the goal here is limb salvage. In combination  with that I think the hyperbaric oxygen therapy would be ultimately significantly helpful for her. 08-07-2022 upon evaluation today patient appears to still be doing quite poorly overall she still has not gotten her antibiotics.  She got her PICC line today which I assumed she will be getting the first dose of antibiotics as well during that time. With that being said unfortunately she did not get the antibiotics and in fact as I questioned her further she does not even know when or if that is going to be started this week. Again I am very concerned about this considering she already has a PICC line in place we do not want this to clot off on top of the fact that she should already be on these antibiotics she just never went to the ER for evaluation therefore they never got this started. At this point based on what I am seeing I really think that the ideal thing would be for us to see about getting her to the ER for further evaluation and treatment to see if they can get this started for her as soon as possible. The patient voiced understanding and is in agreement with the plan. I gave her recommendations for what should be done also called [facility] infusion therapy in order to see if they would be the ones that seem to be available for her IV infusions but they tell me that they no longer do this that is apparently where the orders were sent to for her infusion therapy. That was by her primary care provider. 08-21-2022 upon evaluation today patient's wound on the bottom of the heel and foot area as well as the toe appear to be doing significantly better. Fortunately I do not see any evidence of infection locally or systemically and everything is measuring much smaller than where we have been. The wound in the left gluteal region also is dramatically improved compared to last time she was here. She has started the antibiotics this is cefepime and vancomycin. She did have a somewhat high trough and therefore  they have her hold that today and they are to recheck in the morning I believe she told me. Nonetheless with the antibiotics going she seems to be doing significantly better which is great news. 08-28-2022 upon evaluation today patient appears to be doing somewhat better in regard to her foot there is definitely less drainage than what we have seen in the past. I do believe the antibiotics are helping. With that being said she is also been approved for hyperbaric oxygen therapy and I think as soon as we get this started the better. I discussed that with her today as well. With that being said I do think that the last thing she needs is her chest x-ray when she has that done will be ready to start her into treatment. She is going to try to go get that today when she leaves which I think would be ideal. 09-11-2022 upon evaluation today patient appears to be doing well with regard to her foot ulcer. This is showing signs of no digression and she does not appear to be having as much drainage as before though it still very wet is not nearly the extreme of what it was previous. Fortunately there does not appear to be any signs of active infection locally nor systemically at this point which is great news. Patient did have her chest x-ray as well as EKG and she is actually approved and completely cleared for HBO therapy from both a clinical standpoint as well as an insurance standpoint. 09-25-2022 upon evaluation today patient appears to be doing well in regard to her foot ulcer. Unfortunately she is not doing as well when  it comes to her hyperbaric treatments. She is having a lot of trouble with claustrophobia. She tells me in general that she is not sure she is good to be able to continue. 10-09-2022 upon evaluation today patient's wounds actually are showing signs of excellent improvement which is great news. Fortunately I do not see any evidence of infection locally or systemically which is great news and  overall I am extremely pleased with where things are currently. I do think that she is making good progress she does tell me at this point today she does not want to go forward with a hyperbaric oxygen therapy. 10-16-2022 upon evaluation today patient appears to be doing excellent in regard to her foot ulcer. This is actually showing signs of excellent improvement. She still does not allow for any sharp debridement but the good news is this is doing much better and does not even need it at the moment. Nonetheless as far as sharp debridement is concerned this has been too painful for really not been able to do that all along. Objective Constitutional Obese and well-hydrated in no acute distress. Vitals Time Taken: 1:53 AM, Height: 65 in, Weight: 331 lbs, BMI: 55.1, Temperature: 98.1 F, Pulse: 88 bpm, Respiratory Rate: 20 breaths/min, Blood Pressure: 120/79 mmHg. Respiratory normal breathing without difficulty. Psychiatric this patient is able to make decisions and demonstrates good insight into disease process. Alert and Oriented x 3. pleasant and cooperative. General Notes: Upon inspection patient's wound bed actually showed signs of good granulation epithelization at this point. Fortunately there does not appear to be any signs of active infection locally or systemically which is great news. With that being said she is doing much better. I actually feel like her wound is significantly smaller and she has a lot of good epithelial growth even in the central part of the wound where she can separate this and to which I think will make it much easier for measurement going forward. Integumentary (Hair, Skin) Wound #1 status is Open. Original cause of wound was Gradually Appeared. The date acquired was: 11/07/2020. The wound has been in treatment 18 weeks. The wound is located on the Right Calcaneus. The wound measures 1.9cm length x 1.7cm width x 0.2cm depth; 2.537cm^2 area and 0.507cm^3 volume. There  is Fat Layer (Subcutaneous Tissue) exposed. There is no tunneling or undermining noted. There is a medium amount of serosanguineous drainage noted. The wound margin is thickened. There is large (67-100%) red, pink granulation within the wound bed. There is a small (1-33%) amount of necrotic tissue within the wound bed. The periwound skin appearance exhibited: Callus. The periwound skin appearance did not exhibit: Crepitus, Excoriation, Induration, Rash, Scarring, Dry/Scaly, Maceration, Atrophie Blanche, Cyanosis, Ecchymosis, Hemosiderin Staining, Mottled, Pallor, Rubor, Erythema. Periwound temperature was noted as No Abnormality. The periwound has tenderness on palpation. General Notes: split the wound into two wounds now. Wound #4 status is Open. Original cause of wound was Blister. The date acquired was: 11/08/2020. The wound is located on the Right,Distal,Plantar Foot. The wound measures 1.6cm length x 2cm width x 0.2cm depth; 2.513cm^2 area and 0.503cm^3 volume. There is Fat Layer (Subcutaneous Tissue) exposed. There is no tunneling or undermining noted. There is a medium amount of serosanguineous drainage noted. The wound margin is distinct with the outline attached to [name], [name] ([id number]) [id number] Page 7 of 8 the wound base. There is large (67-100%) red granulation within the wound bed. There is no necrotic tissue within the wound bed. The periwound skin appearance  exhibited: Callus, Dry/Scaly. The periwound skin appearance did not exhibit: Crepitus, Excoriation, Induration, Rash, Scarring, Maceration, Atrophie Blanche, Cyanosis, Ecchymosis, Hemosiderin Staining, Mottled, Pallor, Rubor, Erythema. Assessment Active Problems ICD-10 Other chronic osteomyelitis, right ankle and foot Type 2 diabetes mellitus with foot ulcer Non-pressure chronic ulcer of right heel and midfoot with fat layer exposed Lymphedema, not elsewhere classified Plan Follow-up  Appointments: Return Appointment in 1 week. Leonard Schwartz- Cherrie Franca Wednesday 10/23/2022 1000 Return Appointment in 2 weeks. Leonard Schwartz- Mattalynn Crandle Wednesday 10/23/2022 0930 Anesthetic: (In clinic) Topical Lidocaine 5% applied to wound bed Bathing/ Shower/ Hygiene: May shower with protection but do not get wound dressing(s) wet. Protect dressing(s) with water repellant cover (for example, large plastic bag) or a cast cover and may then take shower. Edema Control - Lymphedema / SCD / Other: Elevate legs to the level of the heart or above for 30 minutes daily and/or when sitting for 3-4 times a day throughout the day. Avoid standing for long periods of time. Off-Loading: Open toe surgical shoe to: - w/ peg assist Other: - Minimize walking and standing as much as possible to right foot to aid in offloading to wound. While at work use the wheelchair for mobility. WOUND #1: - Calcaneus Wound Laterality: Right Cleanser: Soap and Water 1 x Per Day/15 Days Discharge Instructions: May shower and wash wound with dial antibacterial soap and water prior to dressing change. Cleanser: Wound Cleanser (Generic) 1 x Per Day/15 Days Discharge Instructions: Cleanse the wound with wound cleanser prior to applying a clean dressing using gauze sponges, not tissue or cotton balls. Peri-Wound Care: Sween Lotion (Moisturizing lotion) 1 x Per Day/15 Days Discharge Instructions: Apply moisturizing lotion as directed Prim Dressing: KerraCel Ag Gelling Fiber Dressing, 4x5 in (silver alginate) (Generic) 1 x Per Day/15 Days ary Discharge Instructions: Apply silver alginate to wound bed as instructed Secondary Dressing: ABD Pad, 5x9 (DME) (Generic) 1 x Per Day/15 Days Discharge Instructions: Apply over primary dressing as directed. Secondary Dressing: Woven Gauze Sponge, Non-Sterile 4x4 in (Generic) 1 x Per Day/15 Days Discharge Instructions: Apply over primary dressing as directed. Secured With: Insurance underwriterConforming Stretch Gauze Bandage, Sterile 2x75 (in/in)  (DME) (Generic) 1 x Per Day/15 Days Discharge Instructions: Secure with stretch gauze as directed. Secured With: 868M Medipore H Soft Cloth Surgical T ape, 4 x 10 (in/yd) (Generic) 1 x Per Day/15 Days Discharge Instructions: Secure with tape as directed. WOUND #4: - Foot Wound Laterality: Plantar, Right, Distal Cleanser: Soap and Water 1 x Per Day/15 Days Discharge Instructions: May shower and wash wound with dial antibacterial soap and water prior to dressing change. Cleanser: Wound Cleanser (Generic) 1 x Per Day/15 Days Discharge Instructions: Cleanse the wound with wound cleanser prior to applying a clean dressing using gauze sponges, not tissue or cotton balls. Peri-Wound Care: Sween Lotion (Moisturizing lotion) 1 x Per Day/15 Days Discharge Instructions: Apply moisturizing lotion as directed Prim Dressing: KerraCel Ag Gelling Fiber Dressing, 4x5 in (silver alginate) (Generic) 1 x Per Day/15 Days ary Discharge Instructions: Apply silver alginate to wound bed as instructed Secondary Dressing: ABD Pad, 5x9 (Generic) 1 x Per Day/15 Days Discharge Instructions: Apply over primary dressing as directed. Secondary Dressing: Woven Gauze Sponge, Non-Sterile 4x4 in (Generic) 1 x Per Day/15 Days Discharge Instructions: Apply over primary dressing as directed. Secured With: Insurance underwriterConforming Stretch Gauze Bandage, Sterile 2x75 (in/in) (Generic) 1 x Per Day/15 Days Discharge Instructions: Secure with stretch gauze as directed. Secured With: 868M Medipore H Soft Cloth Surgical T ape, 4 x 10 (in/yd) (Generic)  1 x Per Day/15 Days Discharge Instructions: Secure with tape as directed. 1. I am going to recommend that we have the patient continue with the wound care measures as before. We have been utilizing the silver alginate dressings which I think is doing quite well. 2. I am also can recommend we continue with ABD pad to cover followed by roll gauze to secure in place. 3. I would also suggest she continue to use  a wheelchair at work staying off of this. Obviously more that she can do the better as far as I am concerned. 4. I would also suggest that we continue to monitor for infection to ensure nothing worsens. Obviously if anything changes patient knows contact the office and let me know. We will see patient back for reevaluation in 1 week here in the clinic. If anything worsens or changes patient will contact our office for additional recommendations. Electronic Signature(s) Humphries, Alanee ([id number]) [id number] Page 8 of 8 Signed: 10/16/2022 11:22:41 AM By: Worthy Keeler PA-C Entered By: Worthy Keeler on 10/16/2022 11:22:40 -------------------------------------------------------------------------------- SuperBill Details Patient Name: Date of Service: REBECKA, OELKERS 10/16/2022 Medical Record Number: [id number] Patient Account Number: [id number] Date of Birth/Sex: Treating RN: December 25, 1965 (57 y.o. Helene Shoe, Meta.Reding Primary Care Provider: Stoney Bang Other Clinician: Referring Provider: Treating Provider/Extender: Dallas Breeding in Treatment: 18 Diagnosis Coding ICD-10 Codes Code Description [id number] Other chronic osteomyelitis, right ankle and foot E11.621 Type 2 diabetes mellitus with foot ulcer L97.412 Non-pressure chronic ulcer of right heel and midfoot with fat layer exposed I89.0 Lymphedema, not elsewhere classified Facility Procedures : CPT4 Code: [id number] Description: 99214 - WOUND CARE VISIT-LEV 4 EST PT Modifier: Quantity: 1 Physician Procedures : CPT4 Code Description Modifier [id number] - WC PHYS LEVEL 3 - EST PT ICD-10 Diagnosis Description M86.671 Other chronic osteomyelitis, right ankle and foot E11.621 Type 2 diabetes mellitus with foot ulcer L97.412 Non-pressure chronic ulcer of right  heel and midfoot with fat layer exposed I89.0 Lymphedema, not elsewhere classified Quantity: 1 Electronic Signature(s) Signed: 10/16/2022  11:24:00 AM By: Worthy Keeler PA-C Entered By: Worthy Keeler on 10/16/2022 11:24:00

## 2022-10-17 NOTE — Progress Notes (Signed)
[name], [name] ([id number]) [id number] Page 1 of 9 Visit Report for 10/16/2022 Arrival Information Details Patient Name: Date of Service: [name], [name] 10/16/2022 10:30 A M Medical Record Number: [id number] Patient Account Number: [id number] Date of Birth/Sex: Treating RN: Mar 26, 1966 (57 y.o. [name] Primary Care Ivo Moga: [name] Other Clinician: Referring Addalynne Golding: Treating Breton Berns/Extender: [name] in Treatment: 18 Visit Information History Since Last Visit Added or deleted any medications: Yes Patient Arrived: Ambulatory Any new allergies or adverse reactions: No Arrival Time: 10:43 Had a fall or experienced change in No Accompanied By: son activities of daily living that may affect Transfer Assistance: None risk of falls: Patient Identification Verified: Yes Signs or symptoms of abuse/neglect since last visito No Secondary Verification Process Completed: Yes Hospitalized since last visit: No Patient Requires Transmission-Based Precautions: No Implantable device outside of the clinic excluding No Patient Has Alerts: Yes cellular tissue based products placed in the center Patient Alerts: PICC in left arm since last visit: Has Dressing in Place as Prescribed: Yes Pain Present Now: No Notes completed IV antibiotics. Electronic Signature(s) Signed: 10/16/2022 5:22:16 PM By: Shawn Stall RN, BSN Entered By: Shawn Stall on 10/16/2022 10:44:04 -------------------------------------------------------------------------------- Clinic Level of Care Assessment Details Patient Name: Date of Service: [name], [name] 10/16/2022 10:30 A M Medical Record Number: [id number] Patient Account Number: [id number] Date of Birth/Sex: Treating RN: 15-Mar-1966 (57 y.o. [name], Millard.Loa Primary Care Tramar Brueckner: [name] Other Clinician: Referring Alan Drummer: Treating Sevilla Murtagh/Extender: [name] in Treatment:  18 Clinic Level of Care Assessment Items TOOL 4 Quantity Score X- 1 0 Use when only an EandM is performed on FOLLOW-UP visit ASSESSMENTS - Nursing Assessment / Reassessment X- 1 10 Reassessment of Co-morbidities (includes updates in patient status) X- 1 5 Reassessment of Adherence to Treatment Plan ASSESSMENTS - Wound and Skin A ssessment / Reassessment []  - 0 Simple Wound Assessment / Reassessment - one wound X- 2 5 Complex Wound Assessment / Reassessment - multiple wounds []  - 0 Dermatologic / Skin Assessment (not related to wound area) ASSESSMENTS - Focused Assessment X- 1 5 Circumferential Edema Measurements - multi extremities Scalise, [name] (  .pdf Page 2 of 9 []  - 0 Nutritional Assessment / Counseling / Intervention []  - 0 Lower Extremity Assessment (monofilament, tuning fork, pulses) []  - 0 Peripheral Arterial Disease Assessment (using hand held doppler) ASSESSMENTS - Ostomy and/or Continence Assessment and Care []  - 0 Incontinence Assessment and Management []  - 0 Ostomy Care Assessment and Management (repouching, etc.) PROCESS - Coordination of Care []  - 0 Simple Patient / Family Education for ongoing care X- 1 20 Complex (extensive) Patient / Family Education for ongoing care X- 1 10 Staff obtains [id number], Records, T Results / Process Orders est []  - 0 Staff telephones HHA, Nursing Homes / Clarify orders / etc []  - 0 Routine Transfer to another Facility (non-emergent condition) []  - 0 Routine Hospital Admission (non-emergent condition) []  - 0 New Admissions / ) [id number] / Ordering NPWT Apligraf, etc. , []  - 0 Emergency Hospital Admission (emergent condition) []  - 0 Simple Discharge Coordination X- 1 15 Complex (extensive) Discharge Coordination PROCESS - Special Needs []  - 0 Pediatric / Minor Patient Management []  - 0 Isolation Patient Management []  - 0 Hearing / Language / Visual special  needs []  - 0 Assessment of Community assistance (transportation, D/C planning, etc.) []  - 0 Additional assistance / Altered mentation []  - 0 Support Surface(s) Assessment (bed, cushion, seat, etc.) INTERVENTIONS - Wound Cleansing /  Measurement []  - 0 Simple Wound Cleansing - one wound X- 2 5 Complex Wound Cleansing - multiple wounds X- 1 5 Wound Imaging (photographs - any number of wounds) []  - 0 Wound Tracing (instead of photographs) []  - 0 Simple Wound Measurement - one wound X- 2 5 Complex Wound Measurement - multiple wounds INTERVENTIONS - Wound Dressings []  - 0 Small Wound Dressing one or multiple wounds X- 1 15 Medium Wound Dressing one or multiple wounds []  - 0 Large Wound Dressing one or multiple wounds []  - 0 Application of Medications - topical []  - 0 Application of Medications - injection INTERVENTIONS - Miscellaneous []  - 0 External ear exam []  - 0 Specimen Collection (cultures, biopsies, blood, body fluids, etc.) []  - 0 Specimen(s) / Culture(s) sent or taken to Lab for analysis []  - 0 Patient Transfer (multiple staff / Harrel Lemon Lift / Similar devices) []  - 0 Simple Staple / Suture removal (25 or less) []  - 0 Complex Staple / Suture removal (26 or more) [name], Jonnae ([id number]) [id number] Page 3 of 9 []  - 0 Hypo / Hyperglycemic Management (close monitor of Blood Glucose) []  - 0 Ankle / Brachial Index (ABI) - do not check if billed separately X- 1 5 Vital Signs Has the patient been seen at the hospital within the last three years: Yes Total Score: 120 Level Of Care: New/Established - Level 4 Electronic Signature(s) Signed: 10/16/2022 5:22:16 PM By: Deon Pilling RN, BSN Entered By: Deon Pilling on 10/16/2022 11:13:56 -------------------------------------------------------------------------------- Encounter Discharge Information Details Patient Name: Date of Service: [name], [name] 10/16/2022 10:30 A M Medical Record Number:  [id number] Patient Account Number: [id number] Date of Birth/Sex: Treating RN: [birth year] (57 y.o. [name] Primary Care Jermar Colter: Stoney Bang Other Clinician: Referring Evlyn Amason: Treating Brittiny Levitz/Extender: Dallas Breeding in Treatment: 78 Encounter Discharge Information Items Discharge Condition: Stable Ambulatory Status: Ambulatory Discharge Destination: Home Transportation: Private Auto Accompanied By: son Schedule Follow-up Appointment: Yes Clinical Summary of Care: Electronic Signature(s) Signed: 10/16/2022 5:22:16 PM By: Deon Pilling RN, BSN Entered By: Deon Pilling on 10/16/2022 11:14:31 -------------------------------------------------------------------------------- Lower Extremity Assessment Details Patient Name: Date of Service: [name], [name] 10/16/2022 10:30 A M Medical Record Number: [id number] Patient Account Number: [id number] Date of Birth/Sex: Treating RN: [birth year] (57 y.o. [name] Primary Care Yasenia Reedy: Stoney Bang Other Clinician: Referring Amadou Katzenstein: Treating Maylani Embree/Extender: Dallas Breeding in Treatment: 18 Edema Assessment Assessed: [Left: No] Patrice Paradise: Yes] Edema: [Left: N] [Right: o] Calf Left: Right: Point of Measurement: 32 cm From Medial Instep 52 cm Ankle Left: Right: Point of Measurement: 10 cm From Medial Instep 31 cm [name], [name] ([id number]) [id number] Page 4 of 9 Vascular Assessment Pulses: Dorsalis Pedis Palpable: [Right:Yes] Electronic Signature(s) Signed: 10/16/2022 5:22:16 PM By: Deon Pilling RN, BSN Entered By: Deon Pilling on 10/16/2022 10:46:11 -------------------------------------------------------------------------------- Multi-Disciplinary Care Plan Details Patient Name: Date of Service: [name], [name] 10/16/2022 10:30 A M Medical Record Number: [id number] Patient Account Number: [id number] Date of Birth/Sex: Treating RN: 01/29/1966 (57 y.o. [name] Primary Care Hallel Denherder: Stoney Bang Other Clinician: Referring Emalee Knies: Treating Kamala Kolton/Extender: Dallas Breeding in Treatment: 51 Active Inactive Wound/Skin Impairment Nursing Diagnoses: Impaired tissue integrity Knowledge deficit related to ulceration/compromised skin integrity Goals: Patient will have a decrease in wound volume by X% from date: (specify in notes) Date Initiated: 06/12/2022 Target Resolution Date: 11/08/2022 Goal Status: Active Patient/caregiver will verbalize understanding of skin care regimen Date Initiated: 06/12/2022 Target Resolution Date: 11/08/2022 Goal Status: Active Ulcer/skin  breakdown will have a volume reduction of 30% by week 4 Date Initiated: 06/12/2022 Date Inactivated: 07/31/2022 Target Resolution Date: 08/03/2022 Goal Status: Unmet Unmet Reason: dx with osteomyelitis. Ulcer/skin breakdown will have a volume reduction of 50% by week 8 Date Initiated: 06/12/2022 Date Inactivated: 10/16/2022 Target Resolution Date: 10/05/2022 Unmet Reason: see wound Goal Status: Unmet measurement. IV antibiotics Interventions: Assess patient/caregiver ability to obtain necessary supplies Assess patient/caregiver ability to perform ulcer/skin care regimen upon admission and as needed Assess ulceration(s) every visit Notes: Electronic Signature(s) Signed: 10/16/2022 5:22:16 PM By: Shawn Stall RN, BSN Entered By: Shawn Stall on 10/16/2022 10:54:21 -------------------------------------------------------------------------------- Pain Assessment Details Patient Name: Date of Service: [name], [name] 10/16/2022 10:30 A Hyman Bower, [name] ([id number]) [id number] Page 5 of 9 Medical Record Number: [id number] Patient Account Number: [id number] Date of Birth/Sex: Treating RN: August 16, 1966 (57 y.o. [name] Primary Care Phung Kotas: [name] Other Clinician: Referring Carey Johndrow: Treating Handy Mcloud/Extender: [name] in Treatment: 46 Active Problems Location of Pain Severity and Description of Pain Patient Has Paino Yes Site Locations Rate the pain. Current Pain Level: 6 Pain Management and Medication Current Pain Management: Medication: No Cold Application: No Rest: No Massage: No Activity: No T.E.N.S.: No Heat Application: No Leg drop or elevation: No Is the Current Pain Management Adequate: Adequate How does your wound impact your activities of daily livingo Sleep: No Bathing: No Appetite: No Relationship With Others: No Bladder Continence: No Emotions: No Bowel Continence: No Work: No Toileting: No Drive: No Dressing: No Hobbies: No Psychologist, prison and probation services) Signed: 10/16/2022 5:22:16 PM By: Shawn Stall RN, BSN Entered By: Shawn Stall on 10/16/2022 10:44:22 -------------------------------------------------------------------------------- Patient/Caregiver Education Details Patient Name: Date of Service: [name] 1/10/2024andnbsp10:30 A M Medical Record Number: [id number] Patient Account Number: [id number] Date of Birth/Gender: Treating RN: [birth year] (57 y.o. [name] Primary Care Physician: [name] Other Clinician: Referring Physician: Treating Physician/Extender: [name] in Treatment: 44 Education Assessment Education Provided To: Patient Education Topics Provided Wound/Skin ImpairmentPinki Rottman, Syona ([id number]) [id number] Page 6 of 9 Handouts: Caring for Your Ulcer Methods: Explain/Verbal Responses: Reinforcements needed Electronic Signature(s) Signed: 10/16/2022 5:22:16 PM By: Shawn Stall RN, BSN Entered By: Shawn Stall on 10/16/2022 10:54:36 -------------------------------------------------------------------------------- Wound Assessment Details Patient Name: Date of Service: [name], [name] 10/16/2022 10:30 A M Medical Record Number: [id number] Patient Account  Number: [id number] Date of Birth/Sex: Treating RN: [birth year] (57 y.o. [name], Millard.Loa Primary Care Brittan Butterbaugh: [name] Other Clinician: Referring Mckinna Demars: Treating Anyae Griffith/Extender: [name] in Treatment: 18 Wound Status Wound Number: 1 Primary Etiology: Diabetic Wound/Ulcer of the Lower Extremity Wound Location: Right Calcaneus Wound Status: Open Wounding Event: Gradually Appeared Comorbid History: Hypertension, Type II Diabetes, Osteomyelitis Date Acquired: 11/07/2020 Weeks Of Treatment: 18 Clustered Wound: Yes Photos Wound Measurements Length: (cm) Width: (cm) Depth: (cm) Clustered Quantity: Area: (cm) Volume: (cm) 1.9 % Reduction in Area: 78.2% 1.7 % Reduction in Volume: 78.2% 0.2 Epithelialization: Large (67-100%) 1 Tunneling: No 2.537 Undermining: No 0.507 Wound Description Classification: Grade 3 Wound Margin: Thickened Exudate Amount: Medium Exudate Type: Serosanguineous Exudate Color: red, brown Foul Odor After Cleansing: No Slough/Fibrino Yes Wound Bed Granulation Amount: Large (67-100%) Exposed Structure Granulation Quality: Red, Pink Fascia Exposed: No Necrotic Amount: Small (1-33%) Fat Layer (Subcutaneous Tissue) Exposed: Yes Tendon Exposed: No Muscle Exposed: No Joint Exposed: No Bone Exposed: No Periwound Skin Texture [name], [name] ([id number]) [id number] Page 7 of 9 Texture Color No Abnormalities Noted: No No Abnormalities Noted: No Callus:  Yes Atrophie Blanche: No Crepitus: No Cyanosis: No Excoriation: No Ecchymosis: No Induration: No Erythema: No Rash: No Hemosiderin Staining: No Scarring: No Mottled: No Pallor: No Moisture Rubor: No No Abnormalities Noted: No Dry / Scaly: No Temperature / Pain Maceration: No Temperature: No Abnormality Tenderness on Palpation: Yes Assessment Notes split the wound into two wounds now. Treatment Notes Wound #1 (Calcaneus) Wound  Laterality: Right Cleanser Soap and Water Discharge Instruction: May shower and wash wound with dial antibacterial soap and water prior to dressing change. Wound Cleanser Discharge Instruction: Cleanse the wound with wound cleanser prior to applying a clean dressing using gauze sponges, not tissue or cotton balls. Peri-Wound Care Sween Lotion (Moisturizing lotion) Discharge Instruction: Apply moisturizing lotion as directed Topical Primary Dressing KerraCel Ag Gelling Fiber Dressing, 4x5 in (silver alginate) Discharge Instruction: Apply silver alginate to wound bed as instructed Secondary Dressing ABD Pad, 5x9 Discharge Instruction: Apply over primary dressing as directed. Woven Gauze Sponge, Non-Sterile 4x4 in Discharge Instruction: Apply over primary dressing as directed. Secured With Conforming Stretch Gauze Bandage, Sterile 2x75 (in/in) Discharge Instruction: Secure with stretch gauze as directed. 59M Medipore H Soft Cloth Surgical T ape, 4 x 10 (in/yd) Discharge Instruction: Secure with tape as directed. Compression Wrap Compression Stockings Add-Ons Electronic Signature(s) Signed: 10/16/2022 5:22:16 PM By: Deon Pilling RN, BSN Entered By: Deon Pilling on 10/16/2022 11:09:42 -------------------------------------------------------------------------------- Wound Assessment Details Patient Name: Date of Service: [name], [name] 10/16/2022 10:30 A M Medical Record Number: [id number] Patient Account Number: [id number] Date of Birth/Sex: Treating RN: 1966-02-29 (57 y.o. [name] Primary Care Drezden Seitzinger: Stoney Bang Other Clinician: Referring Carmine Youngberg: Treating Hades Mathew/Extender: Matt Holmes [place], [place] ([id number]) [id number] Page 8 of 9 Weeks in Treatment: 18 Wound Status Wound Number: 4 Primary Etiology: Diabetic Wound/Ulcer of the Lower Extremity Wound Location: Right, Distal, Plantar Foot Wound Status: Open Wounding  Event: Blister Notes: splitting the wounds into two wounds. Date Acquired: 11/08/2020 Comorbid History: Hypertension, Type II Diabetes, Osteomyelitis Weeks Of Treatment: 0 Clustered Wound: No Wound Measurements Length: (cm) 1.6 Width: (cm) 2 Depth: (cm) 0.2 Area: (cm) 2.513 Volume: (cm) 0.503 % Reduction in Area: % Reduction in Volume: Epithelialization: Medium (34-66%) Tunneling: No Undermining: No Wound Description Classification: Grade 3 Wagner Verification: MRI Wound Margin: Distinct, outline attached Exudate Amount: Medium Exudate Type: Serosanguineous Exudate Color: red, brown Foul Odor After Cleansing: No Slough/Fibrino No Wound Bed Granulation Amount: Large (67-100%) Exposed Structure Granulation Quality: Red Fascia Exposed: No Necrotic Amount: None Present (0%) Fat Layer (Subcutaneous Tissue) Exposed: Yes Tendon Exposed: No Muscle Exposed: No Joint Exposed: No Bone Exposed: No Periwound Skin Texture Texture Color No Abnormalities Noted: No No Abnormalities Noted: No Callus: Yes Atrophie Blanche: No Crepitus: No Cyanosis: No Excoriation: No Ecchymosis: No Induration: No Erythema: No Rash: No Hemosiderin Staining: No Scarring: No Mottled: No Pallor: No Moisture Rubor: No No Abnormalities Noted: No Dry / Scaly: Yes Maceration: No Treatment Notes Wound #4 (Foot) Wound Laterality: Plantar, Right, Distal Cleanser Soap and Water Discharge Instruction: May shower and wash wound with dial antibacterial soap and water prior to dressing change. Wound Cleanser Discharge Instruction: Cleanse the wound with wound cleanser prior to applying a clean dressing using gauze sponges, not tissue or cotton balls. Peri-Wound Care Sween Lotion (Moisturizing lotion) Discharge Instruction: Apply moisturizing lotion as directed Topical Primary Dressing KerraCel Ag Gelling Fiber Dressing, 4x5 in (silver alginate) Discharge Instruction: Apply silver alginate to  wound bed as instructed Secondary Dressing ABD Pad, 5x9 Discharge Instruction: Apply over primary  dressing as directed. Woven Gauze Sponge, Non-Sterile 4x4 in [name], [name] ([id number]) [id number] Page 9 of 9 Discharge Instruction: Apply over primary dressing as directed. Secured With Conforming Stretch Gauze Bandage, Sterile 2x75 (in/in) Discharge Instruction: Secure with stretch gauze as directed. 14M Medipore H Soft Cloth Surgical T ape, 4 x 10 (in/yd) Discharge Instruction: Secure with tape as directed. Compression Wrap Compression Stockings Add-Ons Electronic Signature(s) Signed: 10/16/2022 5:22:16 PM By: Shawn Stall RN, BSN Entered By: Shawn Stall on 10/16/2022 11:08:44 -------------------------------------------------------------------------------- Vitals Details Patient Name: Date of Service: [name], [name] 10/16/2022 10:30 A M Medical Record Number: [id number] Patient Account Number: [id number] Date of Birth/Sex: Treating RN: 1965/11/01 (56 y.o. [name], Millard.Loa Primary Care Miyah Hampshire: [name] Other Clinician: Referring Soren Lazarz: Treating Ikran Patman/Extender: [name] in Treatment: 18 Vital Signs Time Taken: 01:53 Temperature (F): 98.1 Height (in): 65 Pulse (bpm): 88 Weight (lbs): 331 Respiratory Rate (breaths/min): 20 Body Mass Index (BMI): 55.1 Blood Pressure (mmHg): 120/79 Reference Range: 80 - 120 mg / dl Electronic Signature(s) Signed: 10/16/2022 5:22:16 PM By: Shawn Stall RN, BSN Entered By: Shawn Stall on 10/16/2022 10:53:13

## 2022-10-18 ENCOUNTER — Ambulatory Visit: Payer: Self-pay | Admitting: *Deleted

## 2022-10-18 NOTE — Patient Outreach (Signed)
  Care Coordination   10/18/2022 Name: [name] MRN: [id number] DOB: 1965-12-04   Care Coordination Outreach Attempts:  An unsuccessful telephone outreach was attempted for a scheduled appointment today. 3rd unsuccessful telephone appointment.   Follow Up Plan:  No further outreach attempts will be made at this time. We have been unable to contact the patient to offer or enroll patient in care coordination services  Encounter Outcome:  No Answer   Care Coordination Interventions:  No, not indicated    Left HIPAA compliant voicemail advising patient that no further follow-up attempts will be made but to reach out if she has any care coordination or resource needs.   Chong Sicilian, BSN, RN-BC RN Care Coordinator [place] Direct Dial: [phone] Main #: [phone]

## 2022-10-23 ENCOUNTER — Encounter (HOSPITAL_BASED_OUTPATIENT_CLINIC_OR_DEPARTMENT_OTHER): Payer: No Typology Code available for payment source | Admitting: Physician Assistant

## 2022-10-23 DIAGNOSIS — E11621 Type 2 diabetes mellitus with foot ulcer: Secondary | ICD-10-CM | POA: Diagnosis not present

## 2022-10-23 NOTE — Progress Notes (Addendum)
[name], [name] ([id number].pdf Page 1 of 9 Visit Report for 10/23/2022 Chief Complaint Document Details Patient Name: Date of Service: [name], [name] 10/23/2022 10:00 A M Medical Record Number: [id number] Patient Account Number: [id number] Date of Birth/Sex: Treating RN: 09-Dec-1965 (57 y.o. F) Primary Care Provider: Stoney [name] Other Clinician: Referring Provider: Treating Provider/Extender: [name] in Treatment: 51 Information Obtained from: Patient Chief Complaint Right heel ulcer Electronic Signature(s) Signed: 10/23/2022 10:21:31 AM By: [name] Keeler PA-C Entered By: [name] on 10/23/2022 10:21:31 -------------------------------------------------------------------------------- Debridement Details Patient Name: Date of Service: [name] 10/23/2022 10:00 A M Medical Record Number: [id number] Patient Account Number: [id number] Date of Birth/Sex: Treating RN: [birth year] (57 y.o. [name], [name] Primary Care Provider: Stoney [name] Other Clinician: Referring Provider: Treating Provider/Extender: [name] in Treatment: 19 Debridement Performed for Assessment: Wound #1 Right Calcaneus Performed By: Physician [name] Keeler, PA Debridement Type: Debridement Severity of Tissue Pre Debridement: Fat layer exposed Level of Consciousness (Pre-procedure): Awake and Alert Pre-procedure Verification/Time Out Yes - 11:09 Taken: Start Time: 11:09 Pain Control: Lidocaine T Area Debrided (L x W): otal 1.2 (cm) x 1.4 (cm) = 1.68 (cm) Tissue and other material debrided: Viable, Non-Viable, Skin: Epidermis Level: Skin/Epidermis Debridement Description: Selective/Open Wound Instrument: Curette Bleeding: Minimum Hemostasis Achieved: Pressure End Time: 11:09 Procedural Pain: 0 Post Procedural Pain: 0 Response to Treatment: Procedure was tolerated well Level of Consciousness (Post- Awake  and Alert procedure): Post Debridement Measurements of Total Wound Length: (cm) 1.2 Width: (cm) 1.4 Depth: (cm) 0.2 Volume: (cm) 0.264 Character of Wound/Ulcer Post Debridement: Improved Severity of Tissue Post Debridement: Fat layer exposed [name], [name] ([id number] Page 2 of 9 Post Procedure Diagnosis Same as Pre-procedure Electronic Signature(s) Signed: 10/23/2022 3:53:33 PM By: [name] Keeler PA-C Signed: 11/25/2022 10:40:58 AM By: [name] Hammock RN Entered By: [name] on 10/23/2022 11:10:15 -------------------------------------------------------------------------------- HPI Details Patient Name: Date of Service: [name], [name] 10/23/2022 10:00 A M Medical Record Number: [id number] Patient Account Number: [id number] Date of Birth/Sex: Treating RN: [birth year] (57 y.o. F) Primary Care Provider: Stoney [name] Other Clinician: Referring Provider: Treating Provider/Extender: [name] in Treatment: 19 History of Present Illness HPI Description: 06-13-2022 upon evaluation today patient presents for evaluation of her right heel ulcer. She is having a tremendous amount of pain at this location. She tells me that her most recent hemoglobin A1c was 8.7 and that was on 08-23-2021. This ulcer on the heel has been present she states since February 2023 when she had an injection to the heel by podiatry. She states that she began to have increasing pain the wound opened and has been open ever since. 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 She had an x-ray in June if anything can although there did not appear to be any obvious evidence of osteomyelitis at that point. She subsequently has had OR debridement several times of the wound performed by podiatry. Patient does have a history of lymphedema of the lower extremities she is also a type II diabetic. 06-19-2022 upon evaluation today patient  appears to be doing better in regard to the size of her leg which is significantly improved. With that being said she had a lot of drainage from the heel which is completely understandable considering what is going on at this time. There does not appear to be any evidence of active infection there is no warmth or irritation to the leg in  general. With that being said the patient does have an issue here with the amount of drainage that is going on I definitely think Zetuvit is good to be the better way to go in place of ABD pads. Also think that she could potentially benefit from 3 times per week dressing changes but again right now we are going to stick with the to change it today, change in her Friday, and then subsequently seeing where things stand next Wednesday. The other option would be to change her to a different day for wound care having her come then say like a Tuesday Friday or Monday Thursday. 06-26-2022 upon evaluation patient's wound bed actually showed signs of doing well in regard to the overall size it was measuring slightly smaller. With that being said unfortunately the biggest issue we see is she still has a tremendous amount of drainage which is what could keep this from being able to use a total contact cast. For that reason I am did going to discuss with her today the possibility of trying Tubigrip to see if that could be of benefit for her. She voiced understanding and is in agreement with giving this a try. 07-03-2022 upon evaluation today patient unfortunately appears to be doing significantly worse at this point in regard to her swelling due to the fact that she was unable to really wear the Tubigrip. She tells me that it was cutting into her. With that being said she also did have an MRI this MRI revealed that she does have osteomyelitis in the heel this was actually just performed Monday, 25 September. This does show signs of "early osteomyelitis". Nonetheless this is  still concerning to me. The wound also appears to be getting deeper in the center aspect of this which has me a little concerned as well. I do believe that she is likely going require an aggressive approach here to try to get things better. I discussed that in greater detail with her today. I will detailed in the plan. 07-17-2022 upon evaluation today patient appears to be doing poorly in regard to her wound. Unfortunately she does not show any signs of infection systemically though locally this seems to be doing worse with the wound actually being bigger than where it was previous. She did have an MRI that showed evidence of osteomyelitis actually recommended a referral to ID she was evaluated infectious disease and they recommended referral to podiatry and to be honest have recommended amputation based on what the patient tells me today. With that being said this is something that she is not interested in at all to be perfectly honest. For that reason she wants to know what she should do and where she should go at this point that is where the majority of the conversation which was quite lengthy today went. Obviously understand her concern here but I think she is going to have to definitely get off this and likely this means she is going to need to come out of work. 10/18; Since the patient was the patient is using Hydrofera Blue on her right heel wound. She has a new larger open area superior to the wound. The skin on the bottom of her foot is completely macerated. I reviewed the infectious disease note on this patient from 10/9. They recommended keeping the patient on Delofloxacin 450 twice daily until follow-up tomorrow. The patient states she is not going back there as the only thing they wanted to do was "cut my leg off].  She has not seen podiatry. Her lab works shows a CRP markedly elevated at 240.2 a sedimentation rate of 96 the rest of her CBC is normal creatinine of 0.38 The patient has  been to see her primary doctor who is Dr. Melonie [place] [SPo] In [place]. According the patient Dr. Mylinda Latina has ordered vancomycin and Zosyn to start on November 1. She is not taking the delofloxacin that was suggested by infectious disease. Very angry that they just wanted to consider her for an amputation although I do not specifically see that stated in their note 07-31-2022 upon evaluation today patient still has a significant wound over her plantar aspect of her right l foot region. With that being said I am definitely concerned about the fact that the patient probably does need to be in hyperbaric oxygen therapy at this point. I think we should try to get this going as quickly as possible. She actually is going to be set up with a PICC line next Wednesday and then following this we will have the vancomycin and Zosyn that she will be taking for 8 weeks according to what she tells me. I think this is definitely going to be beneficial and again the goal here is limb salvage. In combination with that I think the hyperbaric oxygen therapy would be ultimately significantly helpful for her. 08-07-2022 upon evaluation today patient appears to still be doing quite poorly overall she still has not gotten her antibiotics. She got her PICC line today which I [name], [name] [phone] [phone].pdf Page 3 of 9 assumed she will be getting the first dose of antibiotics as well during that time. With that being said unfortunately she did not get the antibiotics and in fact as I questioned her further she does not even know when or if that is going to be started this week. Again I am very concerned about this considering she already has a PICC line in place we do not want this to clot off on top of the fact that she should already be on these antibiotics she just never went to the ER for evaluation therefore they never got this started. At this point based on what I am seeing I really think that the  ideal thing would be for [place] to see about getting her to the ER for further evaluation and treatment to see if they can get this started for her as soon as possible. The patient voiced understanding and is in agreement with the plan. I gave her recommendations for what should be done also called [facility] infusion therapy in order to see if they would be the ones that seem to be available for her IV infusions but they tell me that they no longer do this that is apparently where the orders were sent to for her infusion therapy. That was by her primary care provider. 08-21-2022 upon evaluation today patient's wound on the bottom of the heel and foot area as well as the toe appear to be doing significantly better. Fortunately I do not see any evidence of infection locally or systemically and everything is measuring much smaller than where we have been. The wound in the left gluteal region also is dramatically improved compared to last time she was here. She has started the antibiotics this is cefepime and vancomycin. She did have a somewhat high trough and therefore they have her hold that today and they are to recheck in the morning I believe she told me. Nonetheless with the antibiotics going she  seems to be doing significantly better which is great news. 08-28-2022 upon evaluation today patient appears to be doing somewhat better in regard to her foot there is definitely less drainage than what we have seen in the past. I do believe the antibiotics are helping. With that being said she is also been approved for hyperbaric oxygen therapy and I think as soon as we get this started the better. I discussed that with her today as well. With that being said I do think that the last thing she needs is her chest x-ray when she has that done will be ready to start her into treatment. She is going to try to go get that today when she leaves which I think would be ideal. 09-11-2022 upon evaluation today patient appears  to be doing well with regard to her foot ulcer. This is showing signs of no digression and she does not appear to be having as much drainage as before though it still very wet is not nearly the extreme of what it was previous. Fortunately there does not appear to be any signs of active infection locally nor systemically at this point which is great news. Patient did have her chest x-ray as well as EKG and she is actually approved and completely cleared for HBO therapy from both a clinical standpoint as well as an insurance standpoint. 09-25-2022 upon evaluation today patient appears to be doing well in regard to her foot ulcer. Unfortunately she is not doing as well when it comes to her hyperbaric treatments. She is having a lot of trouble with claustrophobia. She tells me in general that she is not sure she is good to be able to continue. 10-09-2022 upon evaluation today patient's wounds actually are showing signs of excellent improvement which is great news. Fortunately I do not see any evidence of infection locally or systemically which is great news and overall I am extremely pleased with where things are currently. I do think that she is making good progress she does tell me at this point today she does not want to go forward with a hyperbaric oxygen therapy. 10-16-2022 upon evaluation today patient appears to be doing excellent in regard to her foot ulcer. This is actually showing signs of excellent improvement. She still does not allow for any sharp debridement but the good news is this is doing much better and does not even need it at the moment. Nonetheless as far as sharp debridement is concerned this has been too painful for really not been able to do that all along. 10-23-2022 upon evaluation today patient actually is making excellent progress. I am extremely pleased with where we stand I have considered even seeing about putting her in the total contact cast but to be honest she is doing so  well currently and still with having some drainage I feel like it is better for her to be able to change it then to be locked up in the cast for a week at a time. She has voiced understanding as well. Overall though I feel like you are making some really good progress here. Electronic Signature(s) Signed: 10/23/2022 2:04:17 PM By: [name] Keeler PA-C Entered By: [name] on 10/23/2022 14:04:17 -------------------------------------------------------------------------------- Physical Exam Details Patient Name: Date of Service: [name], [name] 10/23/2022 10:00 A M Medical Record Number: [id number] Patient Account Number: [id number] Date of Birth/Sex: Treating RN: 1966-02-09 (57 y.o. F) Primary Care Provider: Stoney [name] Other Clinician: Referring Provider: Treating Provider/Extender: Judye Bos,  Velvet Bathe Weeks in Treatment: 48 Constitutional Well-nourished and well-hydrated in no acute distress. Respiratory normal breathing without difficulty. Psychiatric this patient is able to make decisions and demonstrates good insight into disease process. Alert and Oriented x 3. pleasant and cooperative. Notes Upon inspection patient's wound bed actually showed signs of good granulation epithelization at this point. Fortunately I do not see any evidence of infection locally nor systemically which is great news and overall I am extremely pleased with where we stand today. Electronic Signature(s) Signed: 10/23/2022 2:04:38 PM By: [name] Keeler PA-C Entered By: [name] on 10/23/2022 14:04:38 Baumgartner, Darci ([id number].pdf Page 4 of 9 -------------------------------------------------------------------------------- Physician Orders Details Patient Name: Date of Service: [name], [name] 10/23/2022 10:00 A M Medical Record Number: [id number] Patient Account Number: [id number] Date of Birth/Sex: Treating RN: 1966/02/13 (57 y.o. [name],  [name] Primary Care Provider: Stoney [name] Other Clinician: Referring Provider: Treating Provider/Extender: [name] in Treatment: 78 Verbal / Phone Orders: No Diagnosis Coding ICD-10 Coding Code Description [phone] Other chronic osteomyelitis, right ankle and foot E11.621 Type 2 diabetes mellitus with foot ulcer L97.412 Non-pressure chronic ulcer of right heel and midfoot with fat layer exposed I89.0 Lymphedema, not elsewhere classified Follow-up Appointments ppointment in 1 week. Margarita Grizzle Wednesday 10/30/22 @ 0930 Rn # 8 w/ Tammi Klippel (already has appt.) Return A ppointment in 2 weeks. Margarita Grizzle Wednesday (go ahead and make appt.) Return A Anesthetic (In clinic) Topical Lidocaine 5% applied to wound bed Bathing/ Shower/ Hygiene May shower with protection but do not get wound dressing(s) wet. Protect dressing(s) with water repellant cover (for example, large plastic bag) or a cast cover and may then take shower. Edema Control - Lymphedema / SCD / Other Elevate legs to the level of the heart or above for 30 minutes daily and/or when sitting for 3-4 times a day throughout the day. Avoid standing for long periods of time. Off-Loading Open toe surgical shoe to: - w/ peg assist Other: - Minimize walking and standing as much as possible to right foot to aid in offloading to wound. While at work use the wheelchair for mobility. Wound Treatment Wound #1 - Calcaneus Wound Laterality: Right Cleanser: Soap and Water 1 x Per [id number] Days Discharge Instructions: May shower and wash wound with dial antibacterial soap and water prior to dressing change. Cleanser: Wound Cleanser (Generic) 1 x Per Day/15 Days Discharge Instructions: Cleanse the wound with wound cleanser prior to applying a clean dressing using gauze sponges, not tissue or cotton balls. Peri-Wound Care: Sween Lotion (Moisturizing lotion) 1 x Per Day/15 Days Discharge Instructions: Apply moisturizing lotion as  directed Prim Dressing: KerraCel Ag Gelling Fiber Dressing, 4x5 in (silver alginate) (Generic) 1 x Per Day/15 Days ary Discharge Instructions: Apply silver alginate to wound bed as instructed Secondary Dressing: ABD Pad, 5x9 (Generic) 1 x Per Day/15 Days Discharge Instructions: Apply over primary dressing as directed. Secondary Dressing: Woven Gauze Sponge, Non-Sterile 4x4 in (Generic) 1 x Per Day/15 Days Discharge Instructions: Apply over primary dressing as directed. Secured With: Child psychotherapist, Sterile 2x75 (in/in) (Generic) 1 x Per Day/15 Days Discharge Instructions: Secure with stretch gauze as directed. Secured With: 32M Medipore H Soft Cloth Surgical T ape, 4 x 10 (in/yd) (Generic) 1 x Per Day/15 Days Discharge Instructions: Secure with tape as directed. Wound #4 - Foot Wound Laterality: Plantar, Right, Distal Cleanser: Soap and Water 1 x Per Day/15 Days Discharge Instructions: May shower and wash wound with dial antibacterial  soap and water prior to dressing change. Cleanser: Wound Cleanser (Generic) 1 x Per Day/15 Days [name], [name] ([id number] Page 5 of 9 Discharge Instructions: Cleanse the wound with wound cleanser prior to applying a clean dressing using gauze sponges, not tissue or cotton balls. Peri-Wound Care: Sween Lotion (Moisturizing lotion) 1 x Per Day/15 Days Discharge Instructions: Apply moisturizing lotion as directed Prim Dressing: KerraCel Ag Gelling Fiber Dressing, 4x5 in (silver alginate) (Generic) 1 x Per Day/15 Days ary Discharge Instructions: Apply silver alginate to wound bed as instructed Secondary Dressing: ABD Pad, 5x9 (Generic) 1 x Per Day/15 Days Discharge Instructions: Apply over primary dressing as directed. Secondary Dressing: Woven Gauze Sponge, Non-Sterile 4x4 in (Generic) 1 x Per Day/15 Days Discharge Instructions: Apply over primary dressing as directed. Secured With: Media planner, Sterile 2x75 (in/in) (Generic) 1 x Per Day/15 Days Discharge Instructions: Secure with stretch gauze as directed. Secured With: 4M Medipore H Soft Cloth Surgical T ape, 4 x 10 (in/yd) (Generic) 1 x Per Day/15 Days Discharge Instructions: Secure with tape as directed. Electronic Signature(s) Signed: 10/23/2022 3:53:33 PM By: [name] Keeler PA-C Signed: 11/25/2022 10:40:58 AM By: [name] Hammock RN Entered By: [name] on 10/23/2022 11:05:58 -------------------------------------------------------------------------------- Problem List Details Patient Name: Date of Service: VALLE, POIRRIER 10/23/2022 10:00 A M Medical Record Number: [id number] Patient Account Number: [id number] Date of Birth/Sex: Treating RN: 1966-08-15 (57 y.o. F) Primary Care Provider: Stoney [name] Other Clinician: Referring Provider: Treating Provider/Extender: [name] in Treatment: 22 Active Problems ICD-10 Encounter Code Description Active Date MDM Diagnosis [phone] Other chronic osteomyelitis, right ankle and foot 08/28/2022 No Yes E11.621 Type 2 diabetes mellitus with foot ulcer 06/12/2022 No Yes L97.412 Non-pressure chronic ulcer of right heel and midfoot with fat layer exposed 06/12/2022 No Yes I89.0 Lymphedema, not elsewhere classified 06/12/2022 No Yes Inactive Problems Resolved Problems Electronic Signature(s) Signed: 10/23/2022 10:21:23 AM By: [name] Keeler PA-C Entered By: [name] on 10/23/2022 10:21:23 [name], [name] ([id number] Page 6 of 9 -------------------------------------------------------------------------------- Progress Note Details Patient Name: Date of Service: [name], [name] 10/23/2022 10:00 A M Medical Record Number: [id number] Patient Account Number: [id number] Date of Birth/Sex: Treating RN: [birth year] (57 y.o. F) Primary Care Provider: Stoney [name] Other Clinician: Referring Provider: Treating  Provider/Extender: [name] in Treatment: 39 Subjective Chief Complaint Information obtained from Patient Right heel ulcer History of Present Illness (HPI) 06-13-2022 upon evaluation today patient presents for evaluation of her right heel ulcer. She is having a tremendous amount of pain at this location. She tells me that her most recent hemoglobin A1c was 8.7 and that was on 08-23-2021. This ulcer on the heel has been present she states since February 2023 when she had an injection to the heel by podiatry. She states that she began to have increasing pain the wound opened and has been open ever since. 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 She had an x-ray in June if anything can although there did not appear to be any obvious evidence of osteomyelitis at that point. She subsequently has had OR debridement several times of the wound performed by podiatry. Patient does have a history of lymphedema of the lower extremities she is also a type II diabetic. 06-19-2022 upon evaluation today patient appears to be doing better in regard to the size of her leg which is significantly improved. With that being said she had a lot of drainage from the heel  which is completely understandable considering what is going on at this time. There does not appear to be any evidence of active infection there is no warmth or irritation to the leg in general. With that being said the patient does have an issue here with the amount of drainage that is going on I definitely think Zetuvit is good to be the better way to go in place of ABD pads. Also think that she could potentially benefit from 3 times per week dressing changes but again right now we are going to stick with the to change it today, change in her Friday, and then subsequently seeing where things stand next Wednesday. The other option would be to change her to a different day for wound care having her  come then say like a Tuesday Friday or Monday Thursday. 06-26-2022 upon evaluation patient's wound bed actually showed signs of doing well in regard to the overall size it was measuring slightly smaller. With that being said unfortunately the biggest issue we see is she still has a tremendous amount of drainage which is what could keep this from being able to use a total contact cast. For that reason I am did going to discuss with her today the possibility of trying Tubigrip to see if that could be of benefit for her. She voiced understanding and is in agreement with giving this a try. 07-03-2022 upon evaluation today patient unfortunately appears to be doing significantly worse at this point in regard to her swelling due to the fact that she was unable to really wear the Tubigrip. She tells me that it was cutting into her. With that being said she also did have an MRI this MRI revealed that she does have osteomyelitis in the heel this was actually just performed Monday, 25 September. This does show signs of "early osteomyelitis". Nonetheless this is still concerning to me. The wound also appears to be getting deeper in the center aspect of this which has me a little concerned as well. I do believe that she is likely going require an aggressive approach here to try to get things better. I discussed that in greater detail with her today. I will detailed in the plan. 07-17-2022 upon evaluation today patient appears to be doing poorly in regard to her wound. Unfortunately she does not show any signs of infection systemically though locally this seems to be doing worse with the wound actually being bigger than where it was previous. She did have an MRI that showed evidence of osteomyelitis actually recommended a referral to ID she was evaluated infectious disease and they recommended referral to podiatry and to be honest have recommended amputation based on what the patient tells me today. With that being  said this is something that she is not interested in at all to be perfectly honest. For that reason she wants to know what she should do and where she should go at this point that is where the majority of the conversation which was quite lengthy today went. Obviously understand her concern here but I think she is going to have to definitely get off this and likely this means she is going to need to come out of work. 10/18; Since the patient was the patient is using Hydrofera Blue on her right heel wound. She has a new larger open area superior to the wound. The skin on the bottom of her foot is completely macerated. I reviewed the infectious disease note on this patient from 10/9. They  recommended keeping the patient on Delofloxacin 450 twice daily until follow-up tomorrow. The patient states she is not going back there as the only thing they wanted to do was "cut my leg off]. She has not seen podiatry. Her lab works shows a CRP markedly elevated at 240.2 a sedimentation rate of 96 the rest of her CBC is normal creatinine of 0.38 The patient has been to see her primary doctor who is Dr. Melonie Florida [SPo] In [place]. According the patient Dr. Mylinda Latina has ordered vancomycin and Zosyn to start on November 1. She is not taking the delofloxacin that was suggested by infectious disease. Very angry that they just wanted to consider her for an amputation although I do not specifically see that stated in their note 07-31-2022 upon evaluation today patient still has a significant wound over her plantar aspect of her right l foot region. With that being said I am definitely concerned about the fact that the patient probably does need to be in hyperbaric oxygen therapy at this point. I think we should try to get this going as quickly as possible. She actually is going to be set up with a PICC line next Wednesday and then following this we will have the vancomycin and Zosyn that she will be taking for 8 weeks according to  what she tells me. I think this is definitely going to be beneficial and again the goal here is limb salvage. In combination with that I think the hyperbaric oxygen therapy would be ultimately significantly helpful for her. 08-07-2022 upon evaluation today patient appears to still be doing quite poorly overall she still has not gotten her antibiotics. She got her PICC line today which I assumed she will be getting the first dose of antibiotics as well during that time. With that being said unfortunately she did not get the antibiotics and in fact as I questioned her further she does not even know when or if that is going to be started this week. Again I am very concerned about this considering she already has a PICC line in place we do not want this to clot off on top of the fact that she should already be on these antibiotics she just never went to the ER for evaluation therefore they never got this started. At this point based on what I am seeing I really think that the ideal thing would be for [place] to see about getting her to the ER for further evaluation and treatment to see if they can get this started for her as soon as possible. The patient voiced understanding and is in agreement with the plan. I gave her recommendations for what should be done also called [facility] infusion therapy in order to see if they would be the ones that seem to be available for her IV infusions but they tell me that they no longer do this that is apparently where the orders were sent to for her infusion therapy. That was by her primary care provider. [name], [name] ([id number] Page 7 of 9 08-21-2022 upon evaluation today patient's wound on the bottom of the heel and foot area as well as the toe appear to be doing significantly better. Fortunately I do not see any evidence of infection locally or systemically and everything is measuring much smaller than where we have been. The wound in the  left gluteal region also is dramatically improved compared to last time she was here. She has started the antibiotics this is cefepime and vancomycin.  She did have a somewhat high trough and therefore they have her hold that today and they are to recheck in the morning I believe she told me. Nonetheless with the antibiotics going she seems to be doing significantly better which is great news. 08-28-2022 upon evaluation today patient appears to be doing somewhat better in regard to her foot there is definitely less drainage than what we have seen in the past. I do believe the antibiotics are helping. With that being said she is also been approved for hyperbaric oxygen therapy and I think as soon as we get this started the better. I discussed that with her today as well. With that being said I do think that the last thing she needs is her chest x-ray when she has that done will be ready to start her into treatment. She is going to try to go get that today when she leaves which I think would be ideal. 09-11-2022 upon evaluation today patient appears to be doing well with regard to her foot ulcer. This is showing signs of no digression and she does not appear to be having as much drainage as before though it still very wet is not nearly the extreme of what it was previous. Fortunately there does not appear to be any signs of active infection locally nor systemically at this point which is great news. Patient did have her chest x-ray as well as EKG and she is actually approved and completely cleared for HBO therapy from both a clinical standpoint as well as an insurance standpoint. 09-25-2022 upon evaluation today patient appears to be doing well in regard to her foot ulcer. Unfortunately she is not doing as well when it comes to her hyperbaric treatments. She is having a lot of trouble with claustrophobia. She tells me in general that she is not sure she is good to be able to continue. 10-09-2022 upon  evaluation today patient's wounds actually are showing signs of excellent improvement which is great news. Fortunately I do not see any evidence of infection locally or systemically which is great news and overall I am extremely pleased with where things are currently. I do think that she is making good progress she does tell me at this point today she does not want to go forward with a hyperbaric oxygen therapy. 10-16-2022 upon evaluation today patient appears to be doing excellent in regard to her foot ulcer. This is actually showing signs of excellent improvement. She still does not allow for any sharp debridement but the good news is this is doing much better and does not even need it at the moment. Nonetheless as far as sharp debridement is concerned this has been too painful for really not been able to do that all along. 10-23-2022 upon evaluation today patient actually is making excellent progress. I am extremely pleased with where we stand I have considered even seeing about putting her in the total contact cast but to be honest she is doing so well currently and still with having some drainage I feel like it is better for her to be able to change it then to be locked up in the cast for a week at a time. She has voiced understanding as well. Overall though I feel like you are making some really good progress here. Objective Constitutional Well-nourished and well-hydrated in no acute distress. Vitals Time Taken: 10:35 AM, Height: 65 in, Weight: 331 lbs, BMI: 55.1, Temperature: 97.6 F, Pulse: 88 bpm, Respiratory Rate: 18 breaths/min, Blood  Pressure: 124/84 mmHg. Respiratory normal breathing without difficulty. Psychiatric this patient is able to make decisions and demonstrates good insight into disease process. Alert and Oriented x 3. pleasant and cooperative. General Notes: Upon inspection patient's wound bed actually showed signs of good granulation epithelization at this point.  Fortunately I do not see any evidence of infection locally nor systemically which is great news and overall I am extremely pleased with where we stand today. Integumentary (Hair, Skin) Wound #1 status is Open. Original cause of wound was Gradually Appeared. The date acquired was: 11/07/2020. The wound has been in treatment 19 weeks. The wound is located on the Right Calcaneus. The wound measures 1.2cm length x 1.4cm width x 0.2cm depth; 1.319cm^2 area and 0.264cm^3 volume. There is Fat Layer (Subcutaneous Tissue) exposed. There is no tunneling or undermining noted. There is a medium amount of serosanguineous drainage noted. The wound margin is thickened. There is large (67-100%) red, pink granulation within the wound bed. There is a small (1-33%) amount of necrotic tissue within the wound bed. The periwound skin appearance exhibited: Callus. The periwound skin appearance did not exhibit: Crepitus, Excoriation, Induration, Rash, Scarring, Dry/Scaly, Maceration, Atrophie Blanche, Cyanosis, Ecchymosis, Hemosiderin Staining, Mottled, Pallor, Rubor, Erythema. Periwound temperature was noted as No Abnormality. The periwound has tenderness on palpation. Wound #4 status is Open. Original cause of wound was Blister. The date acquired was: 11/08/2020. The wound has been in treatment 1 weeks. The wound is located on the [place]. The wound measures 1cm length x 1cm width x 0.2cm depth; 0.785cm^2 area and 0.157cm^3 volume. There is Fat Layer (Subcutaneous Tissue) exposed. There is no tunneling or undermining noted. There is a medium amount of serosanguineous drainage noted. The wound margin is distinct with the outline attached to the wound base. There is large (67-100%) red granulation within the wound bed. There is no necrotic tissue within the wound bed. The periwound skin appearance exhibited: Callus, Dry/Scaly. The periwound skin appearance did not exhibit: Crepitus, Excoriation,  Induration, Rash, Scarring, Maceration, Atrophie Blanche, Cyanosis, Ecchymosis, Hemosiderin Staining, Mottled, Pallor, Rubor, Erythema. Assessment Active Problems ICD-10 Other chronic osteomyelitis, right ankle and foot Type 2 diabetes mellitus with foot ulcer Non-pressure chronic ulcer of right heel and midfoot with fat layer exposed Lymphedema, not elsewhere classified [name], [name] ([id number] Page 8 of 9 Procedures Wound #1 Pre-procedure diagnosis of Wound #1 is a Diabetic Wound/Ulcer of the Lower Extremity located on the Right Calcaneus .Severity of Tissue Pre Debridement is: Fat layer exposed. There was a Selective/Open Wound Skin/Epidermis Debridement with a total area of 1.68 sq cm performed by [name] Keeler, PA. With the following instrument(s): Curette to remove Viable and Non-Viable tissue/material. Material removed includes Skin: Epidermis after achieving pain control using Lidocaine. No specimens were taken. A time out was conducted at 11:09, prior to the start of the procedure. A Minimum amount of bleeding was controlled with Pressure. The procedure was tolerated well with a pain level of 0 throughout and a pain level of 0 following the procedure. Post Debridement Measurements: 1.2cm length x 1.4cm width x 0.2cm depth; 0.264cm^3 volume. Character of Wound/Ulcer Post Debridement is improved. Severity of Tissue Post Debridement is: Fat layer exposed. Post procedure Diagnosis Wound #1: Same as Pre-Procedure Plan Follow-up Appointments: Return Appointment in 1 week. Margarita Grizzle Wednesday 10/30/22 @ 0930 Rn # 8 w/ Tammi Klippel (already has appt.) Return Appointment in 2 weeks. Margarita Grizzle Wednesday (go ahead and make appt.) Anesthetic: (In clinic) Topical Lidocaine 5% applied to  wound bed Bathing/ Shower/ Hygiene: May shower with protection but do not get wound dressing(s) wet. Protect dressing(s) with water repellant cover (for example, large plastic bag)  or a cast cover and may then take shower. Edema Control - Lymphedema / SCD / Other: Elevate legs to the level of the heart or above for 30 minutes daily and/or when sitting for 3-4 times a day throughout the day. Avoid standing for long periods of time. Off-Loading: Open toe surgical shoe to: - w/ peg assist Other: - Minimize walking and standing as much as possible to right foot to aid in offloading to wound. While at work use the wheelchair for mobility. WOUND #1: - Calcaneus Wound Laterality: Right Cleanser: Soap and Water 1 x Per [id number] Days Discharge Instructions: May shower and wash wound with dial antibacterial soap and water prior to dressing change. Cleanser: Wound Cleanser (Generic) 1 x Per Day/15 Days Discharge Instructions: Cleanse the wound with wound cleanser prior to applying a clean dressing using gauze sponges, not tissue or cotton balls. Peri-Wound Care: Sween Lotion (Moisturizing lotion) 1 x Per Day/15 Days Discharge Instructions: Apply moisturizing lotion as directed Prim Dressing: KerraCel Ag Gelling Fiber Dressing, 4x5 in (silver alginate) (Generic) 1 x Per Day/15 Days ary Discharge Instructions: Apply silver alginate to wound bed as instructed Secondary Dressing: ABD Pad, 5x9 (Generic) 1 x Per Day/15 Days Discharge Instructions: Apply over primary dressing as directed. Secondary Dressing: Woven Gauze Sponge, Non-Sterile 4x4 in (Generic) 1 x Per Day/15 Days Discharge Instructions: Apply over primary dressing as directed. Secured With: Child psychotherapist, Sterile 2x75 (in/in) (Generic) 1 x Per Day/15 Days Discharge Instructions: Secure with stretch gauze as directed. Secured With: 89M Medipore H Soft Cloth Surgical T ape, 4 x 10 (in/yd) (Generic) 1 x Per Day/15 Days Discharge Instructions: Secure with tape as directed. WOUND #4: - Foot Wound Laterality: Plantar, Right, Distal Cleanser: Soap and Water 1 x Per [id number] Days Discharge Instructions: May shower  and wash wound with dial antibacterial soap and water prior to dressing change. Cleanser: Wound Cleanser (Generic) 1 x Per Day/15 Days Discharge Instructions: Cleanse the wound with wound cleanser prior to applying a clean dressing using gauze sponges, not tissue or cotton balls. Peri-Wound Care: Sween Lotion (Moisturizing lotion) 1 x Per Day/15 Days Discharge Instructions: Apply moisturizing lotion as directed Prim Dressing: KerraCel Ag Gelling Fiber Dressing, 4x5 in (silver alginate) (Generic) 1 x Per Day/15 Days ary Discharge Instructions: Apply silver alginate to wound bed as instructed Secondary Dressing: ABD Pad, 5x9 (Generic) 1 x Per Day/15 Days Discharge Instructions: Apply over primary dressing as directed. Secondary Dressing: Woven Gauze Sponge, Non-Sterile 4x4 in (Generic) 1 x Per Day/15 Days Discharge Instructions: Apply over primary dressing as directed. Secured With: Child psychotherapist, Sterile 2x75 (in/in) (Generic) 1 x Per Day/15 Days Discharge Instructions: Secure with stretch gauze as directed. Secured With: 89M Medipore H Soft Cloth Surgical T ape, 4 x 10 (in/yd) (Generic) 1 x Per Day/15 Days Discharge Instructions: Secure with tape as directed. 1. I am going to recommend that we have the patient continue to monitor for any signs of worsening or infection. Based on what I am seeing currently I do believe that we are making good progress. 2. I am also going to recommend that we have the patient continue specifically with the silver alginate dressing followed by the ABD pad and roll gauze to secure in place which seems to be doing excellent. 3. She is also continue to try  to stay off of her feet is much as possible when at work obviously the more that she can do the better. We will see patient back for reevaluation in 1 week here in the clinic. If anything worsens or changes patient will contact our office for additional recommendations. Electronic  Signature(s) Signed: 10/23/2022 2:05:18 PM By: [name] Keeler PA-C Entered By: [name] on 10/23/2022 14:05:18 [name], [name] ([id number] Page 9 of 9 -------------------------------------------------------------------------------- SuperBill Details Patient Name: Date of Service: DORTHY, GRIGSON 10/23/2022 Medical Record Number: [id number] Patient Account Number: [id number] Date of Birth/Sex: Treating RN: 04-15-1966 (57 y.o. F) Primary Care Provider: Stoney [name] Other Clinician: Referring Provider: Treating Provider/Extender: [name] in Treatment: 19 Diagnosis Coding ICD-10 Codes Code Description [phone] Other chronic osteomyelitis, right ankle and foot E11.621 Type 2 diabetes mellitus with foot ulcer L97.412 Non-pressure chronic ulcer of right heel and midfoot with fat layer exposed I89.0 Lymphedema, not elsewhere classified Facility Procedures : CPT4 Code: [id number] Description: [id number] - DEBRIDE WOUND 1ST 20 SQ CM OR < ICD-10 Diagnosis Description L97.412 Non-pressure chronic ulcer of right heel and midfoot with fat layer exposed Modifier: Quantity: 1 Physician Procedures : CPT4 Code Description Modifier [id number] - WC PHYS DEBR WO ANESTH 20 SQ CM ICD-10 Diagnosis Description L97.412 Non-pressure chronic ulcer of right heel and midfoot with fat layer exposed Quantity: 1 Electronic Signature(s) Signed: 10/23/2022 2:05:32 PM By: [name] Keeler PA-C Entered By: [name] on 10/23/2022 14:05:31

## 2022-10-30 ENCOUNTER — Encounter (HOSPITAL_BASED_OUTPATIENT_CLINIC_OR_DEPARTMENT_OTHER): Payer: No Typology Code available for payment source | Admitting: Physician Assistant

## 2022-10-30 DIAGNOSIS — E11621 Type 2 diabetes mellitus with foot ulcer: Secondary | ICD-10-CM | POA: Diagnosis not present

## 2022-10-31 NOTE — Progress Notes (Signed)
[name], [name] ([id number]) [id number] Page 1 of 9 Visit Report for 10/30/2022 Arrival Information Details Patient Name: Date of Service: [name], [name] 10/30/2022 9:30 A M Medical Record Number: [id number] Patient Account Number: [id number] Date of Birth/Sex: Treating [name]: 1965-10-31 (57 y.o. [name], [name]: [name] Other Clinician: Referring [name]: Treating [name]/Extender: [name] in Treatment: 20 Visit Information History Since Last Visit Added or deleted any medications: No Patient Arrived: Ambulatory Any new allergies or adverse reactions: No Arrival Time: 09:43 Had a fall or experienced change in No Accompanied By: son activities of daily living that may affect Transfer Assistance: None risk of falls: Patient Identification Verified: Yes Signs or symptoms of abuse/neglect since last visito No Secondary Verification Process Completed: Yes Hospitalized since last visit: No Patient Requires Transmission-Based Precautions: No Implantable device outside of the clinic excluding No Patient Has Alerts: Yes cellular tissue based products placed in the center Patient Alerts: PICC in left arm since last visit: Has Dressing in Place as Prescribed: Yes Pain Present Now: No Electronic Signature(s) Signed: 10/30/2022 4:09:44 PM By: [name] Entered By: [name] on 10/30/2022 09:44:20 -------------------------------------------------------------------------------- Clinic Level of Care Assessment Details Patient Name: Date of Service: [name], [name] 10/30/2022 9:30 A M Medical Record Number: [id number] Patient Account Number: [id number] Date of Birth/Sex: Treating [name]: 23-Jul-1966 (57 y.o. [name], [name] Primary Care [name]: [name] Other Clinician: Referring [name]: Treating [name]/Extender: [name] in Treatment: 20 Clinic Level of Care  Assessment Items TOOL 4 Quantity Score X- 1 0 Use when only an EandM is performed on FOLLOW-UP visit ASSESSMENTS - Nursing Assessment / Reassessment X- 1 10 Reassessment of Co-morbidities (includes updates in patient status) X- 1 5 Reassessment of Adherence to Treatment Plan ASSESSMENTS - Wound and Skin A ssessment / Reassessment X - Simple Wound Assessment / Reassessment - one wound 1 5 []  - 0 Complex Wound Assessment / Reassessment - multiple wounds []  - 0 Dermatologic / Skin Assessment (not related to wound area) ASSESSMENTS - Focused Assessment X- 1 5 Circumferential Edema Measurements - multi extremities []  - 0 Nutritional Assessment / Counseling / Intervention [name], [name] ([id number]) [id number] Page 2 of 9 []  - 0 Lower Extremity Assessment (monofilament, tuning fork, pulses) []  - 0 Peripheral Arterial Disease Assessment (using hand held doppler) ASSESSMENTS - Ostomy and/or Continence Assessment and Care []  - 0 Incontinence Assessment and Management []  - 0 Ostomy Care Assessment and Management (repouching, etc.) PROCESS - Coordination of Care X - Simple Patient / Family Education for ongoing care 1 15 []  - 0 Complex (extensive) Patient / Family Education for ongoing care X- 1 10 Staff obtains Programmer, systems, Records, T Results / Process Orders est []  - 0 Staff telephones HHA, Nursing Homes / Clarify orders / etc []  - 0 Routine Transfer to another Facility (non-emergent condition) []  - 0 Routine Hospital Admission (non-emergent condition) []  - 0 New Admissions / Biomedical engineer / Ordering NPWT Apligraf, etc. , []  - 0 Emergency Hospital Admission (emergent condition) X- 1 10 Simple Discharge Coordination []  - 0 Complex (extensive) Discharge Coordination PROCESS - Special Needs []  - 0 Pediatric / Minor Patient Management []  - 0 Isolation Patient Management []  - 0 Hearing / Language / Visual special needs []  - 0 Assessment of  Community assistance (transportation, D/C planning, etc.) []  - 0 Additional assistance / Altered mentation []  - 0 Support Surface(s) Assessment (bed, cushion, seat, etc.) INTERVENTIONS - Wound Cleansing / Measurement X -  Simple Wound Cleansing - one wound 1 5 []  - 0 Complex Wound Cleansing - multiple wounds X- 1 5 Wound Imaging (photographs - any number of wounds) []  - 0 Wound Tracing (instead of photographs) X- 1 5 Simple Wound Measurement - one wound []  - 0 Complex Wound Measurement - multiple wounds INTERVENTIONS - Wound Dressings []  - 0 Small Wound Dressing one or multiple wounds X- 1 15 Medium Wound Dressing one or multiple wounds []  - 0 Large Wound Dressing one or multiple wounds []  - 0 Application of Medications - topical []  - 0 Application of Medications - injection INTERVENTIONS - Miscellaneous []  - 0 External ear exam []  - 0 Specimen Collection (cultures, biopsies, blood, body fluids, etc.) []  - 0 Specimen(s) / Culture(s) sent or taken to Lab for analysis []  - 0 Patient Transfer (multiple staff / Civil Service fast streamer / Similar devices) []  - 0 Simple Staple / Suture removal (25 or less) []  - 0 Complex Staple / Suture removal (26 or more) []  - 0 Hypo / Hyperglycemic Management (close monitor of Blood Glucose) [name] ([id number]) [id number] Page 3 of 9 []  - 0 Ankle / Brachial Index (ABI) - do not check if billed separately X- 1 5 Vital Signs Has the patient been seen at the hospital within the last three years: Yes Total Score: 95 Level Of Care: New/Established - Level 3 Electronic Signature(s) Signed: 10/30/2022 6:16:47 PM By: [name] Entered By: [name] on 10/30/2022 10:24:47 -------------------------------------------------------------------------------- Encounter Discharge Information Details Patient Name: Date of Service: [name] 10/30/2022 9:30 A M Medical Record Number: [id number] Patient Account  Number: [id number] Date of Birth/Sex: Treating [name]: 1966-01-27 (57 y.o. [name] Primary Care Zakaria Sedor: [name] Other Clinician: Referring [name]: Treating [name] Berkery/Extender: [name] in Treatment: 20 Encounter Discharge Information Items Discharge Condition: Stable Ambulatory Status: Ambulatory Discharge Destination: Home Transportation: Private Auto Accompanied By: son Schedule Follow-up Appointment: Yes Clinical Summary of Care: Electronic Signature(s) Signed: 10/30/2022 6:16:47 PM By: [name] Entered By: [name] on 10/30/2022 10:25:17 -------------------------------------------------------------------------------- Lower Extremity Assessment Details Patient Name: Date of Service: [name], [name] 10/30/2022 9:30 A M Medical Record Number: [id number] Patient Account Number: [id number] Date of Birth/Sex: Treating [name]: 1966/08/11 (57 y.o. [name] Primary Care Blanche Gallien: [name] Other Clinician: Referring Amit Meloy: Treating Maryjean Corpening/Extender: [name] in Treatment: 20 Edema Assessment Assessed: [Left: No] Patrice Paradise: Yes] Edema: [Left: N] [Right: o] Calf Left: Right: Point of Measurement: 32 cm From Medial Instep 56 cm Ankle Left: Right: Point of Measurement: 10 cm From Medial Instep 33 cm Vascular Assessment Paules, Areen ([id number]) [Right:[id number] Page 4 of 9] Pulses: Dorsalis Pedis Palpable: [Right:Yes] Posterior Tibial Palpable: [Right:Yes] Electronic Signature(s) Signed: 10/30/2022 4:09:44 PM By: [name] Entered By: [name] on 10/30/2022 09:50:01 -------------------------------------------------------------------------------- Multi-Disciplinary Care Plan Details Patient Name: Date of Service: CYNCERE, RUHE 10/30/2022 9:30 A M Medical Record Number: [id number] Patient Account Number: [id number] Date of Birth/Sex: Treating  [name]: 1966-03-17 (57 y.o. [name] Primary Care Valori Hollenkamp: [name] Other Clinician: Referring Dvonte Gatliff: Treating Deriona Altemose/Extender: [name] in Treatment: 20 Active Inactive Wound/Skin Impairment Nursing Diagnoses: Impaired tissue integrity Knowledge deficit related to ulceration/compromised skin integrity Goals: Patient will have a decrease in wound volume by X% from date: (specify in notes) Date Initiated: 06/12/2022 Target Resolution Date: 11/08/2022 Goal Status: Active Patient/caregiver will verbalize understanding of skin care regimen Date Initiated: 06/12/2022 Target Resolution Date: 11/08/2022 Goal Status: Active  Ulcer/skin breakdown will have a volume reduction of 30% by week 4 Date Initiated: 06/12/2022 Date Inactivated: 07/31/2022 Target Resolution Date: 08/03/2022 Goal Status: Unmet Unmet Reason: dx with osteomyelitis. Ulcer/skin breakdown will have a volume reduction of 50% by week 8 Date Initiated: 06/12/2022 Date Inactivated: 10/16/2022 Target Resolution Date: 10/05/2022 Unmet Reason: see wound Goal Status: Unmet measurement. IV antibiotics Interventions: Assess patient/caregiver ability to obtain necessary supplies Assess patient/caregiver ability to perform ulcer/skin care regimen upon admission and as needed Assess ulceration(s) every visit Notes: Electronic Signature(s) Signed: 10/30/2022 6:16:47 PM By: Shawn Stall [name], [name] Entered By: Shawn Stall on 10/30/2022 09:57:09 -------------------------------------------------------------------------------- Pain Assessment Details Patient Name: Date of Service: [name], [name] 10/30/2022 9:30 A M Medical Record Number: [id number] Patient Account Number: [id number] MARGARETHA, MAHAN ([id number]) [phone].pdf Page 5 of 9 Date of Birth/Sex: Treating [name]: [birth year] (57 y.o. [name], [name] Primary Care Annlouise Gerety: Other Clinician: [name] Referring  Lathaniel Legate: Treating Glory Graefe/Extender: [name] in Treatment: 20 Active Problems Location of Pain Severity and Description of Pain Patient Has Paino Yes Site Locations Pain Location: Pain in Ulcers With Dressing Change: Yes Duration of the Pain. Constant / Intermittento Intermittent Rate the pain. Current Pain Level: 5 Worst Pain Level: 10 Least Pain Level: 0 Tolerable Pain Level: 5 Character of Pain Describe the Pain: Aching Pain Management and Medication Current Pain Management: Medication: No Cold Application: No Rest: No Massage: No Activity: No T.E.N.S.: No Heat Application: No Leg drop or elevation: No Is the Current Pain Management Adequate: Adequate How does your wound impact your activities of daily livingo Sleep: No Bathing: No Appetite: No Relationship With Others: No Bladder Continence: No Emotions: No Bowel Continence: No Work: No Toileting: No Drive: No Dressing: No Hobbies: No Electronic Signature(s) Signed: 10/30/2022 4:09:44 PM By: Fonnie Mu [name] Entered By: Fonnie Mu on 10/30/2022 09:49:53 -------------------------------------------------------------------------------- Patient/Caregiver Education Details Patient Name: Date of Service: [name] 1/24/2024andnbsp9:30 A M Medical Record Number: [id number] Patient Account Number: [id number] Date of Birth/Gender: Treating [name]: [birth year] (57 y.o. [name] Primary Care Physician: [name] Other Clinician: Referring Physician: Treating Physician/Extender: [name] in Treatment: 20 Education Assessment Education Provided To: Patient Jakubowicz, [name] ([id number]) [id number] Page 6 of 9 Education Topics Provided Wound/Skin Impairment: Handouts: Caring for Your Ulcer Methods: Explain/Verbal Responses: Reinforcements needed Electronic Signature(s) Signed: 10/30/2022 6:16:47 PM By: Shawn Stall  [name], [name] Entered By: Shawn Stall on 10/30/2022 09:58:09 -------------------------------------------------------------------------------- Wound Assessment Details Patient Name: Date of Service: [name], [name] 10/30/2022 9:30 A M Medical Record Number: [id number] Patient Account Number: [id number] Date of Birth/Sex: Treating [name]: 1966-06-20 (57 y.o. [name], [name] Primary Care Macallister Ashmead: [name] Other Clinician: Referring Ariel Dimitri: Treating Nico Syme/Extender: [name] in Treatment: 20 Wound Status Wound Number: 1 Primary Etiology: Diabetic Wound/Ulcer of the Lower Extremity Wound Location: Right Calcaneus Wound Status: Open Wounding Event: Gradually Appeared Comorbid History: Hypertension, Type II Diabetes, Osteomyelitis Date Acquired: 11/07/2020 Weeks Of Treatment: 20 Clustered Wound: Yes Photos Wound Measurements Length: (cm) Width: (cm) Depth: (cm) Clustered Quantity: Area: (cm) Volume: (cm) 1.2 % Reduction in Area: 90.3% 1.2 % Reduction in Volume: 90.3% 0.2 Epithelialization: Large (67-100%) 1 Tunneling: No 1.131 Undermining: No 0.226 Wound Description Classification: Grade 3 Wound Margin: Thickened Exudate Amount: Medium Exudate Type: Serosanguineous Exudate Color: red, brown Foul Odor After Cleansing: No Slough/Fibrino Yes Wound Bed Granulation Amount: Large (67-100%) Exposed Structure Granulation Quality: Red, Pink Fascia Exposed: No Necrotic Amount: Small (1-33%) Fat Layer (Subcutaneous Tissue)  Exposed: Yes Tendon Exposed: No Muscle Exposed: No Joint Exposed: No Bone Exposed: No Trott, Ramon ([id number]) [id number] Page 7 of 9 Periwound Skin Texture Texture Color No Abnormalities Noted: No No Abnormalities Noted: No Callus: Yes Atrophie Blanche: No Crepitus: No Cyanosis: No Excoriation: No Ecchymosis: No Induration: No Erythema: No Rash: No Hemosiderin Staining: No Scarring:  No Mottled: No Pallor: No Moisture Rubor: No No Abnormalities Noted: No Dry / Scaly: No Temperature / Pain Maceration: No Temperature: No Abnormality Tenderness on Palpation: Yes Treatment Notes Wound #1 (Calcaneus) Wound Laterality: Right Cleanser Soap and Water Discharge Instruction: May shower and wash wound with dial antibacterial soap and water prior to dressing change. Wound Cleanser Discharge Instruction: Cleanse the wound with wound cleanser prior to applying a clean dressing using gauze sponges, not tissue or cotton balls. Peri-Wound Care Sween Lotion (Moisturizing lotion) Discharge Instruction: Apply moisturizing lotion as directed Topical Primary Dressing KerraCel Ag Gelling Fiber Dressing, 4x5 in (silver alginate) Discharge Instruction: Apply silver alginate to wound bed as instructed Secondary Dressing ABD Pad, 5x9 Discharge Instruction: Apply over primary dressing as directed. Woven Gauze Sponge, Non-Sterile 4x4 in Discharge Instruction: Apply over primary dressing as directed. Secured With Conforming Stretch Gauze Bandage, Sterile 2x75 (in/in) Discharge Instruction: Secure with stretch gauze as directed. 42M Medipore H Soft Cloth Surgical T ape, 4 x 10 (in/yd) Discharge Instruction: Secure with tape as directed. Compression Wrap Compression Stockings Add-Ons Electronic Signature(s) Signed: 10/30/2022 4:09:44 PM By: Fonnie Mu [name] Entered By: Fonnie Mu on 10/30/2022 09:54:19 -------------------------------------------------------------------------------- Wound Assessment Details Patient Name: Date of Service: [name], [name] 10/30/2022 9:30 A M Medical Record Number: [id number] Patient Account Number: [id number] Date of Birth/Sex: Treating [name]: 1966-06-23 (57 y.o. [name], [name] Primary Care Jaycub Noorani: [name] Other Clinician: Referring Browning Southwood: Treating Satonya [name]/Extender: [name] [place], [place] ([id number])  [id number] Page 8 of 9 Weeks in Treatment: 20 Wound Status Wound Number: 4 Primary Etiology: Diabetic Wound/Ulcer of the Lower Extremity Wound Location: Right, Distal, Plantar Foot Wound Status: Open Wounding Event: Blister Notes: splitting the wounds into two wounds. Date Acquired: 11/08/2020 Comorbid History: Hypertension, Type II Diabetes, Osteomyelitis Weeks Of Treatment: 2 Clustered Wound: No Photos Wound Measurements Length: (cm) 1 Width: (cm) 1 Depth: (cm) 0.2 Area: (cm) 0.785 Volume: (cm) 0.157 % Reduction in Area: 68.8% % Reduction in Volume: 68.8% Epithelialization: Medium (34-66%) Tunneling: No Undermining: No Wound Description Classification: Grade 3 Wound Margin: Distinct, outline attached Exudate Amount: Medium Exudate Type: Serosanguineous Exudate Color: red, brown Foul Odor After Cleansing: No Slough/Fibrino No Wound Bed Granulation Amount: Large (67-100%) Exposed Structure Granulation Quality: Red Fascia Exposed: No Necrotic Amount: None Present (0%) Fat Layer (Subcutaneous Tissue) Exposed: Yes Tendon Exposed: No Muscle Exposed: No Joint Exposed: No Bone Exposed: No Periwound Skin Texture Texture Color No Abnormalities Noted: No No Abnormalities Noted: No Callus: Yes Atrophie Blanche: No Crepitus: No Cyanosis: No Excoriation: No Ecchymosis: No Induration: No Erythema: No Rash: No Hemosiderin Staining: No Scarring: No Mottled: No Pallor: No Moisture Rubor: No No Abnormalities Noted: No Dry / Scaly: Yes Maceration: No Treatment Notes Wound #4 (Foot) Wound Laterality: Plantar, Right, Distal Cleanser Soap and Water Discharge Instruction: May shower and wash wound with dial antibacterial soap and water prior to dressing change. Wound Cleanser Discharge Instruction: Cleanse the wound with wound cleanser prior to applying a clean dressing using gauze sponges, not tissue or cotton balls. Peri-Wound  Care Wardrop, Kaleigh ([id number]) [id number] Page 9 of 9 Sween Lotion (Moisturizing lotion) Discharge Instruction: Apply moisturizing lotion  as directed Topical Primary Dressing KerraCel Ag Gelling Fiber Dressing, 4x5 in (silver alginate) Discharge Instruction: Apply silver alginate to wound bed as instructed Secondary Dressing ABD Pad, 5x9 Discharge Instruction: Apply over primary dressing as directed. Woven Gauze Sponge, Non-Sterile 4x4 in Discharge Instruction: Apply over primary dressing as directed. Secured With Conforming Stretch Gauze Bandage, Sterile 2x75 (in/in) Discharge Instruction: Secure with stretch gauze as directed. 91M Medipore H Soft Cloth Surgical T ape, 4 x 10 (in/yd) Discharge Instruction: Secure with tape as directed. Compression Wrap Compression Stockings Add-Ons Electronic Signature(s) Signed: 10/30/2022 4:09:44 PM By: Fonnie Mu [name] Entered By: Fonnie Mu on 10/30/2022 09:54:42 -------------------------------------------------------------------------------- Vitals Details Patient Name: Date of Service: [name], [name] 10/30/2022 9:30 A M Medical Record Number: [id number] Patient Account Number: [id number] Date of Birth/Sex: Treating [name]: May 15, 1966 (57 y.o. [name], [name] Primary Care Candler Ginsberg: [name] Other Clinician: Referring Demetrious Rainford: Treating Robby Pirani/Extender: [name] in Treatment: 20 Vital Signs Time Taken: 09:49 Temperature (F): 98.6 Height (in): 65 Pulse (bpm): 74 Weight (lbs): 331 Respiratory Rate (breaths/min): 17 Body Mass Index (BMI): 55.1 Blood Pressure (mmHg): 121/77 Reference Range: 80 - 120 mg / dl Electronic Signature(s) Signed: 10/30/2022 4:09:44 PM By: Fonnie Mu [name] Entered By: Fonnie Mu on 10/30/2022 09:49:47

## 2022-10-31 NOTE — Progress Notes (Signed)
[name] ([id number]) [id number] Page 1 of 8 Visit Report for 10/30/2022 Chief Complaint Document Details Patient Name: Date of Service: [name] 10/30/2022 9:30 A M Medical Record Number: [id number] Patient Account Number: [id number] Date of Birth/Sex: Treating RN: 1966/01/07 (57 y.o. F) Primary Care Provider: Stoney Bang Other Clinician: Referring Provider: Treating Provider/Extender: Dallas Breeding in Treatment: 20 Information Obtained from: Patient Chief Complaint Right heel ulcer Electronic Signature(s) Signed: 10/30/2022 10:24:28 AM By: Worthy Keeler PA-C Entered By: Worthy Keeler on 10/30/2022 10:24:27 -------------------------------------------------------------------------------- HPI Details Patient Name: Date of Service: [name] 10/30/2022 9:30 A M Medical Record Number: [id number] Patient Account Number: [id number] Date of Birth/Sex: Treating RN: 1966/08/15 (57 y.o. F) Primary Care Provider: Stoney Bang Other Clinician: Referring Provider: Treating Provider/Extender: Dallas Breeding in Treatment: 20 History of Present Illness HPI Description: 06-13-2022 upon evaluation today patient presents for evaluation of her right heel ulcer. She is having a tremendous amount of pain at this location. She tells me that her most recent hemoglobin A1c was 8.7 and that was on 08-23-2021. This ulcer on the heel has been present she states since February 2023 when she had an injection to the heel by podiatry. She states that she began to have increasing pain the wound opened and has been open ever since. 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 She had an x-ray in June if anything can although there did not appear to be any obvious evidence of osteomyelitis at that point. She subsequently has had OR debridement several times of the wound performed by podiatry. Patient does have a  history of lymphedema of the lower extremities she is also a type II diabetic. 06-19-2022 upon evaluation today patient appears to be doing better in regard to the size of her leg which is significantly improved. With that being said she had a lot of drainage from the heel which is completely understandable considering what is going on at this time. There does not appear to be any evidence of active infection there is no warmth or irritation to the leg in general. With that being said the patient does have an issue here with the amount of drainage that is going on I definitely think Zetuvit is good to be the better way to go in place of ABD pads. Also think that she could potentially benefit from 3 times per week dressing changes but again right now we are going to stick with the to change it today, change in her Friday, and then subsequently seeing where things stand next Wednesday. The other option would be to change her to a different day for wound care having her come then say like a Tuesday Friday or Monday Thursday. 06-26-2022 upon evaluation patient's wound bed actually showed signs of doing well in regard to the overall size it was measuring slightly smaller. With that being said unfortunately the biggest issue we see is she still has a tremendous amount of drainage which is what could keep this from being able to use a total contact cast. For that reason I am did going to discuss with her today the possibility of trying Tubigrip to see if that could be of benefit for her. She voiced understanding and is in agreement with giving this a try. 07-03-2022 upon evaluation today patient unfortunately appears to be doing significantly worse at this point in regard to her swelling due to the fact that she was unable to really  wear the Tubigrip. She tells me that it was cutting into her. With that being said she also did have an MRI this MRI revealed that she does have osteomyelitis in the heel this was  actually just performed Monday, 25 September. This does show signs of "early osteomyelitis". Nonetheless this is still concerning to me. The wound also appears to be getting deeper in the center aspect of this which has me a little concerned as well. I do believe that she is likely going require an aggressive approach here to try to get things better. I discussed that in greater detail with her today. I will detailed in the plan. 07-17-2022 upon evaluation today patient appears to be doing poorly in regard to her wound. Unfortunately she does not show any signs of infection systemically though locally this seems to be doing worse with the wound actually being bigger than where it was previous. She did have an MRI that showed evidence of osteomyelitis actually recommended a referral to ID she was evaluated infectious disease and they recommended referral to podiatry and to be honest have recommended amputation based on what the patient tells me today. With that being said this is something that she is not interested in at all to be [name] ([id number]) [id number].pdf Page 2 of 8 perfectly honest. For that reason she wants to know what she should do and where she should go at this point that is where the majority of the conversation which was quite lengthy today went. Obviously understand her concern here but I think she is going to have to definitely get off this and likely this means she is going to need to come out of work. 10/18; Since the patient was the patient is using Hydrofera Blue on her right heel wound. She has a new larger open area superior to the wound. The skin on the bottom of her foot is completely macerated. I reviewed the infectious disease note on this patient from 10/9. They recommended keeping the patient on Delofloxacin 450 twice daily until follow-up tomorrow. The patient states she is not going back there as the only thing they wanted to do was "cut  my leg off]. She has not seen podiatry. Her lab works shows a CRP markedly elevated at 240.2 a sedimentation rate of 96 the rest of her CBC is normal creatinine of 0.38 The patient has been to see her primary doctor who is Dr. Leonides Schanz [[facility]] [place]. According the patient Dr. Loralee Pacas has ordered vancomycin and Zosyn to start on November 1. She is not taking the delofloxacin that was suggested by infectious disease. Very angry that they just wanted to consider her for an amputation although I do not specifically see that stated in their note 07-31-2022 upon evaluation today patient still has a significant wound over her plantar aspect of her right l foot region. With that being said I am definitely concerned about the fact that the patient probably does need to be in hyperbaric oxygen therapy at this point. I think we should try to get this going as quickly as possible. She actually is going to be set up with a PICC line next Wednesday and then following this we will have the vancomycin and Zosyn that she will be taking for 8 weeks according to what she tells me. I think this is definitely going to be beneficial and again the goal here is limb salvage. In combination with that I think the hyperbaric oxygen therapy would be ultimately significantly  helpful for her. 08-07-2022 upon evaluation today patient appears to still be doing quite poorly overall she still has not gotten her antibiotics. She got her PICC line today which I assumed she will be getting the first dose of antibiotics as well during that time. With that being said unfortunately she did not get the antibiotics and in fact as I questioned her further she does not even know when or if that is going to be started this week. Again I am very concerned about this considering she already has a PICC line in place we do not want this to clot off on top of the fact that she should already be on these antibiotics she just never went to the ER  for evaluation therefore they never got this started. At this point based on what I am seeing I really think that the ideal thing would be for [place] to see about getting her to the ER for further evaluation and treatment to see if they can get this started for her as soon as possible. The patient voiced understanding and is in agreement with the plan. I gave her recommendations for what should be done also called [facility] infusion therapy in order to see if they would be the ones that seem to be available for her IV infusions but they tell me that they no longer do this that is apparently where the orders were sent to for her infusion therapy. That was by her primary care provider. 08-21-2022 upon evaluation today patient's wound on the bottom of the heel and foot area as well as the toe appear to be doing significantly better. Fortunately I do not see any evidence of infection locally or systemically and everything is measuring much smaller than where we have been. The wound in the left gluteal region also is dramatically improved compared to last time she was here. She has started the antibiotics this is cefepime and vancomycin. She did have a somewhat high trough and therefore they have her hold that today and they are to recheck in the morning I believe she told me. Nonetheless with the antibiotics going she seems to be doing significantly better which is great news. 08-28-2022 upon evaluation today patient appears to be doing somewhat better in regard to her foot there is definitely less drainage than what we have seen in the past. I do believe the antibiotics are helping. With that being said she is also been approved for hyperbaric oxygen therapy and I think as soon as we get this started the better. I discussed that with her today as well. With that being said I do think that the last thing she needs is her chest x-ray when she has that done will be ready to start her into treatment. She is going to  try to go get that today when she leaves which I think would be ideal. 09-11-2022 upon evaluation today patient appears to be doing well with regard to her foot ulcer. This is showing signs of no digression and she does not appear to be having as much drainage as before though it still very wet is not nearly the extreme of what it was previous. Fortunately there does not appear to be any signs of active infection locally nor systemically at this point which is great news. Patient did have her chest x-ray as well as EKG and she is actually approved and completely cleared for HBO therapy from both a clinical standpoint as well as an insurance standpoint.  09-25-2022 upon evaluation today patient appears to be doing well in regard to her foot ulcer. Unfortunately she is not doing as well when it comes to her hyperbaric treatments. She is having a lot of trouble with claustrophobia. She tells me in general that she is not sure she is good to be able to continue. 10-09-2022 upon evaluation today patient's wounds actually are showing signs of excellent improvement which is great news. Fortunately I do not see any evidence of infection locally or systemically which is great news and overall I am extremely pleased with where things are currently. I do think that she is making good progress she does tell me at this point today she does not want to go forward with a hyperbaric oxygen therapy. 10-16-2022 upon evaluation today patient appears to be doing excellent in regard to her foot ulcer. This is actually showing signs of excellent improvement. She still does not allow for any sharp debridement but the good news is this is doing much better and does not even need it at the moment. Nonetheless as far as sharp debridement is concerned this has been too painful for really not been able to do that all along. 10-23-2022 upon evaluation today patient actually is making excellent progress. I am extremely pleased with where  we stand I have considered even seeing about putting her in the total contact cast but to be honest she is doing so well currently and still with having some drainage I feel like it is better for her to be able to change it then to be locked up in the cast for a week at a time. She has voiced understanding as well. Overall though I feel like you are making some really good progress here. 10-30-2022 upon evaluation today patient appears to be doing excellent in regard to her foot ulcers. Both are showing signs of improvement which is great news and overall I feel like that they are measuring smaller, looking better, and I see no signs of resurgence of the infection which is great news. Overall I am extremely pleased at this point. Electronic Signature(s) Signed: 10/30/2022 5:34:10 PM By: Lenda KelpStone III, Azariah Bonura PA-C Entered By: Lenda KelpStone III, Osker Ayoub on 10/30/2022 17:34:10 -------------------------------------------------------------------------------- Physical Exam Details Patient Name: Date of Service: [name] DashDLEW, Arabela 10/30/2022 9:30 A M Medical Record Number: [id number] Patient Account Number: [id number] Date of Birth/Sex: Treating RN: 12/09/1965 (57 y.o. F) Primary Care Provider: Lia HoppingHasanaj, Xaje Other ClinicianDarral [name]: Sleeth, Kaithlyn ([id number]) [id number] Page 3 of 8 Referring Provider: Treating Provider/Extender: Laurann MontanaStone III, Josalyn Dettmann Hasanaj, Xaje Weeks in Treatment: 20 Constitutional Well-nourished and well-hydrated in no acute distress. Respiratory normal breathing without difficulty. Psychiatric this patient is able to make decisions and demonstrates good insight into disease process. Alert and Oriented x 3. pleasant and cooperative. Notes Upon inspection patient's wound bed actually showed signs at both locations of showing good epithelization around the edges of the wound overall I am extremely pleased with where we stand and I do believe that we are headed in the right direction  here. Electronic Signature(s) Signed: 10/30/2022 5:34:35 PM By: Lenda KelpStone III, Demetrios Byron PA-C Entered By: Lenda KelpStone III, Kairav Russomanno on 10/30/2022 17:34:34 -------------------------------------------------------------------------------- Physician Orders Details Patient Name: Date of Service: [name] DashDLEW, Roschelle 10/30/2022 9:30 A M Medical Record Number: [id number] Patient Account Number: [id number] Date of Birth/Sex: Treating RN: 02/10/1966 (57 y.o. Arta SilenceF) Deaton, Bobbi Primary Care Provider: Lia HoppingHasanaj, Xaje Other Clinician: Referring Provider: Treating Provider/Extender: Laurann MontanaStone III, Dierdre Mccalip Hasanaj, Xaje Weeks in Treatment: 20 Verbal / Phone Orders: No Diagnosis Coding  ICD-10 Coding Code Description [phone] Other chronic osteomyelitis, right ankle and foot E11.621 Type 2 diabetes mellitus with foot ulcer L97.412 Non-pressure chronic ulcer of right heel and midfoot with fat layer exposed I89.0 Lymphedema, not elsewhere classified Follow-up Appointments ppointment in 1 week. Leonard Schwartz Wednesday 11/06/2022 0845 Return A ppointment in 2 weeks. Leonard Schwartz Wednesday 11/13/2022 1045 Return A Anesthetic (In clinic) Topical Lidocaine 5% applied to wound bed Bathing/ Shower/ Hygiene May shower with protection but do not get wound dressing(s) wet. Protect dressing(s) with water repellant cover (for example, large plastic bag) or a cast cover and may then take shower. Edema Control - Lymphedema / SCD / Other Elevate legs to the level of the heart or above for 30 minutes daily and/or when sitting for 3-4 times a day throughout the day. Avoid standing for long periods of time. Off-Loading Open toe surgical shoe to: - w/ peg assist Other: - Minimize walking and standing as much as possible to right foot to aid in offloading to wound. While at work use the wheelchair for mobility. Wound Treatment Wound #1 - Calcaneus Wound Laterality: Right Cleanser: Soap and Water 1 x Per Day/15 Days Discharge Instructions: May shower and wash wound with  dial antibacterial soap and water prior to dressing change. Cleanser: Wound Cleanser (Generic) 1 x Per Day/15 Days Discharge Instructions: Cleanse the wound with wound cleanser prior to applying a clean dressing using gauze sponges, not tissue or cotton balls. Beamon, [name] ([id number]) [id number] Page 4 of 8 Peri-Wound Care: Sween Lotion (Moisturizing lotion) 1 x Per Day/15 Days Discharge Instructions: Apply moisturizing lotion as directed Prim Dressing: KerraCel Ag Gelling Fiber Dressing, 4x5 in (silver alginate) (Generic) 1 x Per Day/15 Days ary Discharge Instructions: Apply silver alginate to wound bed as instructed Secondary Dressing: ABD Pad, 5x9 (Generic) 1 x Per Day/15 Days Discharge Instructions: Apply over primary dressing as directed. Secondary Dressing: Woven Gauze Sponge, Non-Sterile 4x4 in (Generic) 1 x Per Day/15 Days Discharge Instructions: Apply over primary dressing as directed. Secured With: Insurance underwriter, Sterile 2x75 (in/in) (Generic) 1 x Per Day/15 Days Discharge Instructions: Secure with stretch gauze as directed. Secured With: 59M Medipore H Soft Cloth Surgical T ape, 4 x 10 (in/yd) (Generic) 1 x Per Day/15 Days Discharge Instructions: Secure with tape as directed. Wound #4 - Foot Wound Laterality: Plantar, Right, Distal Cleanser: Soap and Water 1 x Per Day/15 Days Discharge Instructions: May shower and wash wound with dial antibacterial soap and water prior to dressing change. Cleanser: Wound Cleanser (Generic) 1 x Per Day/15 Days Discharge Instructions: Cleanse the wound with wound cleanser prior to applying a clean dressing using gauze sponges, not tissue or cotton balls. Peri-Wound Care: Sween Lotion (Moisturizing lotion) 1 x Per Day/15 Days Discharge Instructions: Apply moisturizing lotion as directed Prim Dressing: KerraCel Ag Gelling Fiber Dressing, 4x5 in (silver alginate) (Generic) 1 x Per Day/15  Days ary Discharge Instructions: Apply silver alginate to wound bed as instructed Secondary Dressing: ABD Pad, 5x9 (Generic) 1 x Per Day/15 Days Discharge Instructions: Apply over primary dressing as directed. Secondary Dressing: Woven Gauze Sponge, Non-Sterile 4x4 in (Generic) 1 x Per Day/15 Days Discharge Instructions: Apply over primary dressing as directed. Secured With: Insurance underwriter, Sterile 2x75 (in/in) (Generic) 1 x Per Day/15 Days Discharge Instructions: Secure with stretch gauze as directed. Secured With: 59M Medipore H Soft Cloth Surgical T ape, 4 x 10 (in/yd) (Generic) 1 x Per Day/15 Days Discharge Instructions: Secure with tape as directed. Electronic  Signature(s) Signed: 10/30/2022 5:48:31 PM By: Lenda KelpStone III, Bryndon Cumbie PA-C Signed: 10/30/2022 6:16:47 PM By: Shawn Stalleaton, Bobbi RN, BSN Entered By: Shawn Stalleaton, Bobbi on 10/30/2022 10:23:11 -------------------------------------------------------------------------------- Problem List Details Patient Name: Date of Service: [name] DashDLEW, Zaynab 10/30/2022 9:30 A M Medical Record Number: [id number] Patient Account Number: [id number] Date of Birth/Sex: Treating RN: 05/10/1966 (57 y.o. Arta SilenceF) Deaton, Bobbi Primary Care Provider: Lia HoppingHasanaj, Xaje Other Clinician: Referring Provider: Treating Provider/Extender: Laurann MontanaStone III, Jaia Alonge Hasanaj, Xaje Weeks in Treatment: 20 Active Problems ICD-10 Encounter Code Description Active Date MDM Diagnosis [phone] Other chronic osteomyelitis, right ankle and foot 08/28/2022 No Yes Kusek, Intisar ([id number]) [id number] Page 5 of 8 E11.621 Type 2 diabetes mellitus with foot ulcer 06/12/2022 No Yes L97.412 Non-pressure chronic ulcer of right heel and 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o. F) Primary Care Provider: Lia HoppingHasanaj, Xaje Other Clinician: Referring Provider: Treating Provider/Extender: Laurann MontanaStone III, Elivia Robotham Hasanaj, Xaje Weeks in Treatment: 20 Subjective Chief Complaint Information obtained from Patient Right heel ulcer History of Present Illness (HPI) 06-13-2022 upon evaluation today patient presents for evaluation of her right heel ulcer. She is having a tremendous amount of pain at this location. She tells me that her most recent hemoglobin A1c was 8.7 and that was on 08-23-2021. This ulcer on the heel has been present she states since February 2023 when she had an injection to the heel by podiatry. She states that she began to have increasing pain the wound opened and has been open ever since. 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 She had an x-ray in June if anything can although there did not appear to be any obvious evidence of osteomyelitis at that point. She subsequently has had OR debridement several times of the wound performed by podiatry. Patient does have a history of lymphedema of the lower extremities she is also a type II diabetic. 06-19-2022 upon evaluation today patient appears to be doing better in regard to the size of her leg which is significantly improved. With that being said she had a lot of drainage from the heel which is completely understandable considering what is going on at this time. There does not appear to be any evidence of active infection there is no warmth or irritation to the leg in general. With that being said the patient does have an issue here with the amount of drainage that is going on I  definitely think Zetuvit is good to be the better way to go in place of ABD pads. Also think that she could potentially benefit from 3 times per week dressing changes but again right now we are going to stick with the to change it today, change in her Friday, and then subsequently seeing where things stand next Wednesday. The other option would be to change her to a different day for wound care having her come then say like a Tuesday Friday or Monday Thursday. 06-26-2022 upon evaluation patient's wound bed actually showed signs of doing well in regard to the overall size it was measuring slightly smaller. With that being said unfortunately the biggest issue we see is she still has a tremendous amount  of drainage which is what could keep this from being able to use a total contact cast. For that reason I am did going to discuss with her today the possibility of trying Tubigrip to see if that could be of benefit for her. She voiced understanding and is in agreement with giving this a try. 07-03-2022 upon evaluation today patient unfortunately appears to be doing significantly worse at this point in regard to her swelling due to the fact that she was unable to really wear the Tubigrip. She tells me that it was cutting into her. With that being said she also did have an MRI this MRI revealed that she does have osteomyelitis in the heel this was actually just performed Monday, 25 September. This does show signs of "early osteomyelitis". Nonetheless this is still concerning to me. The wound also appears to be getting deeper in the center aspect of this which has me a little concerned as well. I do believe that she is likely going require an aggressive approach here to try to get things better. I discussed that in greater detail with her today. I will detailed in the plan. 07-17-2022 upon evaluation today patient appears to be doing poorly in regard to her wound. Unfortunately she does not show any signs of  infection systemically though locally this seems to be doing worse with the wound actually being bigger than where it was previous. She did have an MRI that showed evidence of osteomyelitis actually recommended a referral to ID she was evaluated infectious disease and they recommended referral to podiatry and to be honest have recommended amputation based on what the patient tells me today. With that being said this is something that she is not interested in at all to be perfectly honest. For that reason she wants to know what she should do and where she should go at this point that is where the majority of the conversation which was quite lengthy today went. Obviously understand her concern here but I think she is going to have to definitely get off this and likely this means she Vaillancourt, Alyvia ([id number]) [phone].pdf Page 6 of 8 is going to need to come out of work. 10/18; Since the patient was the patient is using Hydrofera Blue on her right heel wound. She has a new larger open area superior to the wound. The skin on the bottom of her foot is completely macerated. I reviewed the infectious disease note on this patient from 10/9. They recommended keeping the patient on Delofloxacin 450 twice daily until follow-up tomorrow. The patient states she is not going back there as the only thing they wanted to do was "cut my leg off]. She has not seen podiatry. Her lab works shows a CRP markedly elevated at 240.2 a sedimentation rate of 96 the rest of her CBC is normal creatinine of 0.38 The patient has been to see her primary doctor who is Dr. Leonides Schanz [SPo] In [place]. According the patient Dr. Loralee Pacas has ordered vancomycin and Zosyn to start on November 1. She is not taking the delofloxacin that was suggested by infectious disease. Very angry that they just wanted to consider her for an amputation although I do not specifically see that stated in their note 07-31-2022 upon  evaluation today patient still has a significant wound over her plantar aspect of her right l foot region. With that being said I am definitely concerned about the fact that the patient probably does need to be in hyperbaric oxygen therapy  at this point. I think we should try to get this going as quickly as possible. She actually is going to be set up with a PICC line next Wednesday and then following this we will have the vancomycin and Zosyn that she will be taking for 8 weeks according to what she tells me. I think this is definitely going to be beneficial and again the goal here is limb salvage. In combination with that I think the hyperbaric oxygen therapy would be ultimately significantly helpful for her. 08-07-2022 upon evaluation today patient appears to still be doing quite poorly overall she still has not gotten her antibiotics. She got her PICC line today which I assumed she will be getting the first dose of antibiotics as well during that time. With that being said unfortunately she did not get the antibiotics and in fact as I questioned her further she does not even know when or if that is going to be started this week. Again I am very concerned about this considering she already has a PICC line in place we do not want this to clot off on top of the fact that she should already be on these antibiotics she just never went to the ER for evaluation therefore they never got this started. At this point based on what I am seeing I really think that the ideal thing would be for [place] to see about getting her to the ER for further evaluation and treatment to see if they can get this started for her as soon as possible. The patient voiced understanding and is in agreement with the plan. I gave her recommendations for what should be done also called [facility] infusion therapy in order to see if they would be the ones that seem to be available for her IV infusions but they tell me that they no longer do this  that is apparently where the orders were sent to for her infusion therapy. That was by her primary care provider. 08-21-2022 upon evaluation today patient's wound on the bottom of the heel and foot area as well as the toe appear to be doing significantly better. Fortunately I do not see any evidence of infection locally or systemically and everything is measuring much smaller than where we have been. The wound in the left gluteal region also is dramatically improved compared to last time she was here. She has started the antibiotics this is cefepime and vancomycin. She did have a somewhat high trough and therefore they have her hold that today and they are to recheck in the morning I believe she told me. Nonetheless with the antibiotics going she seems to be doing significantly better which is great news. 08-28-2022 upon evaluation today patient appears to be doing somewhat better in regard to her foot there is definitely less drainage than what we have seen in the past. I do believe the antibiotics are helping. With that being said she is also been approved for hyperbaric oxygen therapy and I think as soon as we get this started the better. I discussed that with her today as well. With that being said I do think that the last thing she needs is her chest x-ray when she has that done will be ready to start her into treatment. She is going to try to go get that today when she leaves which I think would be ideal. 09-11-2022 upon evaluation today patient appears to be doing well with regard to her foot ulcer. This is showing signs  of no digression and she does not appear to be having as much drainage as before though it still very wet is not nearly the extreme of what it was previous. Fortunately there does not appear to be any signs of active infection locally nor systemically at this point which is great news. Patient did have her chest x-ray as well as EKG and she is actually approved and completely  cleared for HBO therapy from both a clinical standpoint as well as an insurance standpoint. 09-25-2022 upon evaluation today patient appears to be doing well in regard to her foot ulcer. Unfortunately she is not doing as well when it comes to her hyperbaric treatments. She is having a lot of trouble with claustrophobia. She tells me in general that she is not sure she is good to be able to continue. 10-09-2022 upon evaluation today patient's wounds actually are showing signs of excellent improvement which is great news. Fortunately I do not see any evidence of infection locally or systemically which is great news and overall I am extremely pleased with where things are currently. I do think that she is making good progress she does tell me at this point today she does not want to go forward with a hyperbaric oxygen therapy. 10-16-2022 upon evaluation today patient appears to be doing excellent in regard to her foot ulcer. This is actually showing signs of excellent improvement. She still does not allow for any sharp debridement but the good news is this is doing much better and does not even need it at the moment. Nonetheless as far as sharp debridement is concerned this has been too painful for really not been able to do that all along. 10-23-2022 upon evaluation today patient actually is making excellent progress. I am extremely pleased with where we stand I have considered even seeing about putting her in the total contact cast but to be honest she is doing so well currently and still with having some drainage I feel like it is better for her to be able to change it then to be locked up in the cast for a week at a time. She has voiced understanding as well. Overall though I feel like you are making some really good progress here. 10-30-2022 upon evaluation today patient appears to be doing excellent in regard to her foot ulcers. Both are showing signs of improvement which is great news and overall I feel  like that they are measuring smaller, looking better, and I see no signs of resurgence of the infection which is great news. Overall I am extremely pleased at this point. Objective Constitutional Well-nourished and well-hydrated in no acute distress. Vitals Time Taken: 9:49 AM, Height: 65 in, Weight: 331 lbs, BMI: 55.1, Temperature: 98.6 F, Pulse: 74 bpm, Respiratory Rate: 17 breaths/min, Blood Pressure: 121/77 mmHg. Respiratory normal breathing without difficulty. Psychiatric this patient is able to make decisions and demonstrates good insight into disease process. Alert and Oriented x 3. pleasant and cooperative. Friel, Jenny [name] ([id number]) [id number] Page 7 of 8 General Notes: Upon inspection patient's wound bed actually showed signs at both locations of showing good epithelization around the edges of the wound overall I am extremely pleased with where we stand and I do believe that we are headed in the right direction here. Integumentary (Hair, Skin) Wound #1 status is Open. Original cause of wound was Gradually Appeared. The date acquired was: 11/07/2020. The wound has been in treatment 20 weeks. The wound is located on the Right Calcaneus. The  wound measures 1.2cm length x 1.2cm width x 0.2cm depth; 1.131cm^2 area and 0.226cm^3 volume. There is Fat Layer (Subcutaneous Tissue) exposed. There is no tunneling or undermining noted. There is a medium amount of serosanguineous drainage noted. The wound margin is thickened. There is large (67-100%) red, pink granulation within the wound bed. There is a small (1-33%) amount of necrotic tissue within the wound bed. The periwound skin appearance exhibited: Callus. The periwound skin appearance did not exhibit: Crepitus, Excoriation, Induration, Rash, Scarring, Dry/Scaly, Maceration, Atrophie Blanche, Cyanosis, Ecchymosis, Hemosiderin Staining, Mottled, Pallor, Rubor, Erythema. Periwound temperature was noted as No  Abnormality. The periwound has tenderness on palpation. Wound #4 status is Open. Original cause of wound was Blister. The date acquired was: 11/08/2020. The wound has been in treatment 2 weeks. The wound is located on the Right,Distal,Plantar Foot. The wound measures 1cm length x 1cm width x 0.2cm depth; 0.785cm^2 area and 0.157cm^3 volume. There is Fat Layer (Subcutaneous Tissue) exposed. There is no tunneling or undermining noted. There is a medium amount of serosanguineous drainage noted. The wound margin is distinct with the outline attached to the wound base. There is large (67-100%) red granulation within the wound bed. There is no necrotic tissue within the wound bed. The periwound skin appearance exhibited: Callus, Dry/Scaly. The periwound skin appearance did not exhibit: Crepitus, Excoriation, Induration, Rash, Scarring, Maceration, Atrophie Blanche, Cyanosis, Ecchymosis, Hemosiderin Staining, Mottled, Pallor, Rubor, Erythema. Assessment Active Problems ICD-10 Other chronic osteomyelitis, right ankle and foot Type 2 diabetes mellitus with foot ulcer Non-pressure chronic ulcer of right heel and midfoot with fat layer exposed Lymphedema, not elsewhere classified Plan Follow-up Appointments: Return Appointment in 1 week. Leonard Schwartz Wednesday 11/06/2022 0845 Return Appointment in 2 weeks. Leonard Schwartz Wednesday 11/13/2022 1045 Anesthetic: (In clinic) Topical Lidocaine 5% applied to wound bed Bathing/ Shower/ Hygiene: May shower with protection but do not get wound dressing(s) wet. Protect dressing(s) with water repellant cover (for example, large plastic bag) or a cast cover and may then take shower. Edema Control - Lymphedema / SCD / Other: Elevate legs to the level of the heart or above for 30 minutes daily and/or when sitting for 3-4 times a day throughout the day. Avoid standing for long periods of time. Off-Loading: Open toe surgical shoe to: - w/ peg assist Other: - Minimize walking and  standing as much as possible to right foot to aid in offloading to wound. While at work use the wheelchair for mobility. WOUND #1: - Calcaneus Wound Laterality: Right Cleanser: Soap and Water 1 x Per Day/15 Days Discharge Instructions: May shower and wash wound with dial antibacterial soap and water prior to dressing change. Cleanser: Wound Cleanser (Generic) 1 x Per Day/15 Days Discharge Instructions: Cleanse the wound with wound cleanser prior to applying a clean dressing using gauze sponges, not tissue or cotton balls. Peri-Wound Care: Sween Lotion (Moisturizing lotion) 1 x Per Day/15 Days Discharge Instructions: Apply moisturizing lotion as directed Prim Dressing: KerraCel Ag Gelling Fiber Dressing, 4x5 in (silver alginate) (Generic) 1 x Per Day/15 Days ary Discharge Instructions: Apply silver alginate to wound bed as instructed Secondary Dressing: ABD Pad, 5x9 (Generic) 1 x Per Day/15 Days Discharge Instructions: Apply over primary dressing as directed. Secondary Dressing: Woven Gauze Sponge, Non-Sterile 4x4 in (Generic) 1 x Per Day/15 Days Discharge Instructions: Apply over primary dressing as directed. Secured With: Insurance underwriter, Sterile 2x75 (in/in) (Generic) 1 x Per Day/15 Days Discharge Instructions: Secure with stretch gauze as directed. Secured With: [employer]  Medipore H Soft Cloth Surgical T ape, 4 x 10 (in/yd) (Generic) 1 x Per Day/15 Days Discharge Instructions: Secure with tape as directed. WOUND #4: - Foot Wound Laterality: Plantar, Right, Distal Cleanser: Soap and Water 1 x Per Day/15 Days Discharge Instructions: May shower and wash wound with dial antibacterial soap and water prior to dressing change. Cleanser: Wound Cleanser (Generic) 1 x Per Day/15 Days Discharge Instructions: Cleanse the wound with wound cleanser prior to applying a clean dressing using gauze sponges, not tissue or cotton balls. Peri-Wound Care: Sween Lotion (Moisturizing lotion) 1 x Per  Day/15 Days Discharge Instructions: Apply moisturizing lotion as directed Prim Dressing: KerraCel Ag Gelling Fiber Dressing, 4x5 in (silver alginate) (Generic) 1 x Per Day/15 Days ary Discharge Instructions: Apply silver alginate to wound bed as instructed Secondary Dressing: ABD Pad, 5x9 (Generic) 1 x Per Day/15 Days Discharge Instructions: Apply over primary dressing as directed. Secondary Dressing: Woven Gauze Sponge, Non-Sterile 4x4 in (Generic) 1 x Per Day/15 Days Discharge Instructions: Apply over primary dressing as directed. Secured With: Insurance underwriterConforming Stretch Gauze Bandage, Sterile 2x75 (in/in) (Generic) 1 x Per Day/15 Days Discharge Instructions: Secure with stretch gauze as directed. Secured With: [employer]M [employer] H Soft Cloth Surgical T ape, 4 x 10 (in/yd) (Generic) 1 x Per Day/15 Days Discharge Instructions: Secure with tape as directed. [name], [name] AspINDY ([id number]) [id number] Page 8 of 8 1. I would recommend that we have the patient continue to monitor for any signs of infection or worsening. Based on what I am seeing I do believe that we are headed in the right direction and I think that the patient is doing great keeping off of this. 2. I am good recommend as well that she continue with the silver alginate followed by the ABD pads and then roll gauze to secure in place. 3. She will continue to use the wheelchair when at work again she is trying to do everything she can to keep pressure off and obviously it has paid off. We will see patient back for reevaluation in 1 week here in the clinic. If anything worsens or changes patient will contact our office for additional recommendations. Electronic Signature(s) Signed: 10/30/2022 5:35:10 PM By: Lenda KelpStone III, Shaka Cardin PA-C Entered By: Lenda KelpStone III, Danetra Glock on 10/30/2022 17:35:09 -------------------------------------------------------------------------------- SuperBill Details Patient Name: Date of Service: [name] DashDLEW, Celicia  10/30/2022 Medical Record Number: [id number] Patient Account Number: [id number] Date of Birth/Sex: Treating RN: 10/13/1965 (57 y.o. Debara PickettF) Deaton, Millard.LoaBobbi Primary Care Provider: Lia HoppingHasanaj, Xaje Other Clinician: Referring Provider: Treating Provider/Extender: Laurann MontanaStone III, Sabas Frett Hasanaj, Xaje Weeks in Treatment: 20 Diagnosis Coding ICD-10 Codes Code Description 7345877290M86.671 Other chronic osteomyelitis, right ankle and foot E11.621 Type 2 diabetes mellitus with foot ulcer L97.412 Non-pressure chronic ulcer of right heel and midfoot with fat layer exposed I89.0 Lymphedema, not elsewhere classified Facility Procedures : CPT4 Code: [id number] Description: 99213 - WOUND CARE VISIT-LEV 3 EST PT Modifier: Quantity: 1 Physician Procedures : CPT4 Code Description Modifier [id number] 99213 - WC PHYS LEVEL 3 - EST PT ICD-10 Diagnosis Description M86.671 Other chronic osteomyelitis, right ankle and foot E11.621 Type 2 diabetes mellitus with foot ulcer L97.412 Non-pressure chronic ulcer of right  heel and midfoot with fat layer exposed I89.0 Lymphedema, not elsewhere classified Quantity: 1 Electronic Signature(s) Signed: 10/30/2022 5:37:14 PM By: Lenda KelpStone III, Lyfe Monger PA-C Entered By: Lenda KelpStone III, Anelle Parlow on 10/30/2022 17:37:14

## 2022-11-06 ENCOUNTER — Encounter (HOSPITAL_BASED_OUTPATIENT_CLINIC_OR_DEPARTMENT_OTHER): Payer: No Typology Code available for payment source | Admitting: Physician Assistant

## 2022-11-13 ENCOUNTER — Encounter (HOSPITAL_BASED_OUTPATIENT_CLINIC_OR_DEPARTMENT_OTHER): Payer: No Typology Code available for payment source | Attending: Physician Assistant | Admitting: Internal Medicine

## 2022-11-13 ENCOUNTER — Ambulatory Visit (HOSPITAL_BASED_OUTPATIENT_CLINIC_OR_DEPARTMENT_OTHER): Payer: No Typology Code available for payment source | Admitting: Physician Assistant

## 2022-11-13 DIAGNOSIS — I10 Essential (primary) hypertension: Secondary | ICD-10-CM | POA: Diagnosis not present

## 2022-11-13 DIAGNOSIS — Z6841 Body Mass Index (BMI) 40.0 and over, adult: Secondary | ICD-10-CM | POA: Insufficient documentation

## 2022-11-13 DIAGNOSIS — M86671 Other chronic osteomyelitis, right ankle and foot: Secondary | ICD-10-CM | POA: Diagnosis not present

## 2022-11-13 DIAGNOSIS — E11621 Type 2 diabetes mellitus with foot ulcer: Secondary | ICD-10-CM | POA: Diagnosis present

## 2022-11-13 DIAGNOSIS — L97412 Non-pressure chronic ulcer of right heel and midfoot with fat layer exposed: Secondary | ICD-10-CM | POA: Diagnosis not present

## 2022-11-13 DIAGNOSIS — L84 Corns and callosities: Secondary | ICD-10-CM | POA: Insufficient documentation

## 2022-11-13 DIAGNOSIS — E669 Obesity, unspecified: Secondary | ICD-10-CM | POA: Insufficient documentation

## 2022-11-13 DIAGNOSIS — I890 Lymphedema, not elsewhere classified: Secondary | ICD-10-CM | POA: Diagnosis not present

## 2022-11-14 NOTE — Progress Notes (Signed)
Klooster, [name] ([id number]) [id number] Page 1 of 9 Visit Report for 11/13/2022 Arrival Information Details Patient Name: Date of Service: GEORGA, STYS 11/13/2022 10:45 A M Medical Record Number: [id number] Patient Account Number: [id number] Date of Birth/Sex: Treating RN: 1966-06-29 (57 y.o. F) Primary Care Estiben Mizuno: Stoney Bang Other Clinician: Referring Zaelyn Noack: Treating Ayaz Sondgeroth/Extender: Sherryl Manges in Treatment: 26 Visit Information History Since Last Visit Added or deleted any medications: Yes Patient Arrived: Ambulatory Any new allergies or adverse reactions: No Arrival Time: 10:54 Had a fall or experienced change in No Accompanied By: son activities of daily living that may affect Transfer Assistance: None risk of falls: Patient Identification Verified: Yes Signs or symptoms of abuse/neglect since last visito No Secondary Verification Process Completed: Yes Hospitalized since last visit: No Patient Requires Transmission-Based Precautions: No Implantable device outside of the clinic excluding No Patient Has Alerts: Yes cellular tissue based products placed in the center Patient Alerts: PICC in left arm since last visit: Has Dressing in Place as Prescribed: Yes Pain Present Now: Yes Electronic Signature(s) Signed: 11/13/2022 4:14:43 PM By: Erenest Blank Entered By: Erenest Blank on 11/13/2022 10:56:45 -------------------------------------------------------------------------------- Encounter Discharge Information Details Patient Name: Date of Service: JAMAYA, SLEETH 11/13/2022 10:45 A M Medical Record Number: [id number] Patient Account Number: [id number] Date of Birth/Sex: Treating RN: September 01, 1966 (57 y.o. Helene Shoe, Tammi Klippel Primary Care Marisal Swarey: Stoney Bang Other Clinician: Referring Braylie Badami: Treating Jovane Foutz/Extender: Sherryl Manges in Treatment: 22 Encounter Discharge Information Items Post Procedure  Vitals Discharge Condition: Stable Temperature (F): 98.1 Ambulatory Status: Ambulatory Pulse (bpm): 105 Discharge Destination: Home Respiratory Rate (breaths/min): 20 Transportation: Private Auto Blood Pressure (mmHg): 125/84 Accompanied By: son Schedule Follow-up Appointment: Yes Clinical Summary of Care: Electronic Signature(s) Signed: 11/13/2022 5:13:10 PM By: Deon Pilling RN, BSN Entered By: Deon Pilling on 11/13/2022 11:39:50 Maino, Crickett ([id number]) [id number] Page 2 of 9 -------------------------------------------------------------------------------- Lower Extremity Assessment Details Patient Name: Date of Service: LATESHA, CHESNEY 11/13/2022 10:45 A M Medical Record Number: [id number] Patient Account Number: [id number] Date of Birth/Sex: Treating RN: [birth year] (57 y.o. F) Primary Care Barbi Kumagai: Stoney Bang Other Clinician: Referring Alanmichael Barmore: Treating Estela Vinal/Extender: Sherryl Manges in Treatment: 22 Edema Assessment Assessed: Shirlyn Goltz: No] Patrice Paradise: No] Edema: [Left: N] [Right: o] Calf Left: Right: Point of Measurement: 32 cm From Medial Instep 61.5 cm Ankle Left: Right: Point of Measurement: 10 cm From Medial Instep 35 cm Electronic Signature(s) Signed: 11/13/2022 4:14:43 PM By: Erenest Blank Entered By: Erenest Blank on 11/13/2022 11:05:56 -------------------------------------------------------------------------------- Multi Wound Chart Details Patient Name: Date of Service: LEGACIE, DILLINGHAM 11/13/2022 10:45 A M Medical Record Number: [id number] Patient Account Number: [id number] Date of Birth/Sex: Treating RN: [birth year] (57 y.o. F) Primary Care Daana Petrasek: Stoney Bang Other Clinician: Referring Mubashir Mallek: Treating Charbel Los/Extender: Sherryl Manges in Treatment: 22 Vital Signs Height(in): 65 Pulse(bpm): 105 Weight(lbs): 331 Blood Pressure(mmHg): 125/84 Body Mass Index(BMI): 55.1 Temperature(F):  98.1 Respiratory Rate(breaths/min): 18 [1:Photos:] [N/A:N/A] Right Calcaneus Right, Distal, Plantar Foot N/A Wound Location: Gradually Appeared Blister N/A Wounding Event: Diabetic Wound/Ulcer of the Lower Diabetic Wound/Ulcer of the Lower N/A Primary Etiology: Extremity Extremity Hypertension, Type II Diabetes, Hypertension, Type II Diabetes, N/A Comorbid History: Osteomyelitis Osteomyelitis Luevano, Brette ([id number]) [id number] Page 3 of 9 11/07/2020 11/08/2020 N/A Date Acquired: 31 4 N/A Weeks of Treatment: Open Healed - Epithelialized N/A Wound Status: No No N/A Wound Recurrence: Yes No N/A Clustered Wound: 1 N/A N/A Clustered Quantity: 1.2x1x0.2 0x0x0 N/A Measurements L x W x D (cm) 0.942 0 N/A A (cm) :  rea 0.188 0 N/A Volume (cm) : 91.90% 100.00% N/A % Reduction in A rea: 91.90% 100.00% N/A % Reduction in Volume: Grade 3 Grade 3 N/A Classification: Medium Medium N/A Exudate A mount: Serosanguineous Serosanguineous N/A Exudate Type: red, brown red, brown N/A Exudate Color: Thickened Distinct, outline attached N/A Wound Margin: Large (67-100%) Large (67-100%) N/A Granulation A mount: Red, Pink Red N/A Granulation Quality: None Present (0%) None Present (0%) N/A Necrotic A mount: Fat Layer (Subcutaneous Tissue): Yes Fat Layer (Subcutaneous Tissue): Yes N/A Exposed Structures: Fascia: No Fascia: No Tendon: No Tendon: No Muscle: No Muscle: No Joint: No Joint: No Bone: No Bone: No Large (67-100%) Medium (34-66%) N/A Epithelialization: Debridement - Excisional N/A N/A Debridement: Pre-procedure Verification/Time Out 11:10 N/A N/A Taken: Lidocaine 5% topical ointment N/A N/A Pain Control: Subcutaneous, Slough N/A N/A Tissue Debrided: Skin/Subcutaneous Tissue N/A N/A Level: 1.2 N/A N/A Debridement A (sq cm): rea Curette N/A N/A Instrument: Minimum N/A N/A Bleeding: Pressure N/A N/A Hemostasis A chieved: 0 N/A  N/A Procedural Pain: 0 N/A N/A Post Procedural Pain: Procedure was tolerated well N/A N/A Debridement Treatment Response: 1.2x1x0.2 N/A N/A Post Debridement Measurements L x W x D (cm) 0.188 N/A N/A Post Debridement Volume: (cm) Callus: Yes Callus: Yes N/A Periwound Skin Texture: Excoriation: No Excoriation: No Induration: No Induration: No Crepitus: No Crepitus: No Rash: No Rash: No Scarring: No Scarring: No Maceration: Yes Maceration: No N/A Periwound Skin Moisture: Dry/Scaly: No Dry/Scaly: No Atrophie Blanche: No Atrophie Blanche: No N/A Periwound Skin Color: Cyanosis: No Cyanosis: No Ecchymosis: No Ecchymosis: No Erythema: No Erythema: No Hemosiderin Staining: No Hemosiderin Staining: No Mottled: No Mottled: No Pallor: No Pallor: No Rubor: No Rubor: No No Abnormality N/A N/A Temperature: Yes N/A N/A Tenderness on Palpation: Debridement N/A N/A Procedures Performed: Treatment Notes Electronic Signature(s) Signed: 11/13/2022 3:38:34 PM By: Linton Ham MD Entered By: Linton Ham on 11/13/2022 11:39:21 -------------------------------------------------------------------------------- Multi-Disciplinary Care Plan Details Patient Name: Date of Service: CHASEY, DULL 11/13/2022 10:45 A M Medical Record Number: [id number] Patient Account Number: [id number] Date of Birth/Sex: Treating RN: 08-May-1966 (57 y.o. Debby Bud Primary Care Chalon Zobrist: Stoney Bang Other Clinician: Sabino Snipes ([id number]) [id number] Page 4 of 9 Referring Kealani Leckey: Treating Shalamar Crays/Extender: Sherryl Manges in Treatment: 22 Active Inactive Wound/Skin Impairment Nursing Diagnoses: Impaired tissue integrity Knowledge deficit related to ulceration/compromised skin integrity Goals: Patient will have a decrease in wound volume by X% from date: (specify in notes) Date Initiated: 06/12/2022 Target Resolution Date: 12/06/2022 Goal  Status: Active Patient/caregiver will verbalize understanding of skin care regimen Date Initiated: 06/12/2022 Target Resolution Date: 12/06/2022 Goal Status: Active Ulcer/skin breakdown will have a volume reduction of 30% by week 4 Date Initiated: 06/12/2022 Date Inactivated: 07/31/2022 Target Resolution Date: 08/03/2022 Goal Status: Unmet Unmet Reason: dx with osteomyelitis. Ulcer/skin breakdown will have a volume reduction of 50% by week 8 Date Initiated: 06/12/2022 Date Inactivated: 10/16/2022 Target Resolution Date: 10/05/2022 Unmet Reason: see wound Goal Status: Unmet measurement. IV antibiotics Interventions: Assess patient/caregiver ability to obtain necessary supplies Assess patient/caregiver ability to perform ulcer/skin care regimen upon admission and as needed Assess ulceration(s) every visit Notes: Electronic Signature(s) Signed: 11/13/2022 5:13:10 PM By: Deon Pilling RN, BSN Entered By: Deon Pilling on 11/13/2022 11:16:06 -------------------------------------------------------------------------------- Non-Wound Condition Assessment Details Patient Name: Date of Service: TRESIA, REVOLORIO 11/13/2022 10:45 A M Medical Record Number: [id number] Patient Account Number: [id number] Date of Birth/Sex: Treating RN: October 30, 1965 (57 y.o. Debby Bud Primary Care Maizee Reinhold: Stoney Bang Other Clinician: Referring Wyatt Galvan: Treating Rydan Gulyas/Extender:  Odetta Pink Weeks in Treatment: 22 Non-Wound Condition: Condition: Other Dermatologic Condition Location: Foot Side: Right Periwound Skin Texture Texture Color No Abnormalities Noted: No No Abnormalities Noted: No Callus: Yes Atrophie Blanche: No Crepitus: No Cyanosis: No Excoriation: No Ecchymosis: No Friable: No Erythema: No Induration: No Hemosiderin Staining: No Rash: No Mottled: No Scarring: No Pallor: No Rubor: No Moisture No Abnormalities Noted: No Dry / Scaly: Yes Maceration: No Throne, Aracelli  ([id number]) [id number] Page 5 of 9 Notes right mid plantar foot area. Electronic Signature(s) Signed: 11/13/2022 5:13:10 PM By: Shawn Stall RN, BSN Entered By: Shawn Stall on 11/13/2022 11:19:42 -------------------------------------------------------------------------------- Pain Assessment Details Patient Name: Date of Service: KAMREN, HESKETT 11/13/2022 10:45 A M Medical Record Number: [id number] Patient Account Number: [id number] Date of Birth/Sex: Treating RN: 06-Jun-1966 (57 y.o. F) Primary Care Myreon Wimer: [name] Other Clinician: Referring Lititia Sen: Treating Tyan Dy/Extender: Delora Fuel in Treatment: 22 Active Problems Location of Pain Severity and Description of Pain Patient Has Paino Yes Site Locations Pain Location: Pain in Ulcers Rate the pain. Current Pain Level: 7 Pain Management and Medication Current Pain Management: Electronic Signature(s) Signed: 11/13/2022 4:14:43 PM By: Thayer Dallas Entered By: Thayer Dallas on 11/13/2022 10:58:11 -------------------------------------------------------------------------------- Patient/Caregiver Education Details Patient Name: Date of Service: Darral Dash 2/7/2024andnbsp10:45 A M Medical Record Number: [id number] Patient Account Number: [id number] Date of Birth/Gender: Treating RN: 1966/08/15 (57 y.o. [name] Primary Care Physician: [name] Other Clinician: Referring Physician: Treating Physician/Extender: Delora Fuel in Treatment: [address] Education Assessment Nethery, Juli ([id number]) [id number] Page 6 of 9 Education Provided To: Patient Education Topics Provided Wound/Skin Impairment: Handouts: Caring for Your Ulcer Methods: Explain/Verbal Responses: Reinforcements needed Electronic Signature(s) Signed: 11/13/2022 5:13:10 PM By: Shawn Stall RN, BSN Entered By: Shawn Stall on 11/13/2022  11:16:22 -------------------------------------------------------------------------------- Wound Assessment Details Patient Name: Date of Service: EVERLINA, GOTTS 11/13/2022 10:45 A M Medical Record Number: [id number] Patient Account Number: [id number] Date of Birth/Sex: Treating RN: 1966-02-13 (58 y.o. F) Primary Care Curtisha Bendix: [name] Other Clinician: Referring Waris Rodger: Treating Shirin Echeverry/Extender: Delora Fuel in Treatment: 22 Wound Status Wound Number: 1 Primary Etiology: Diabetic Wound/Ulcer of the Lower Extremity Wound Location: Right Calcaneus Wound Status: Open Wounding Event: Gradually Appeared Comorbid History: Hypertension, Type II Diabetes, Osteomyelitis Date Acquired: 11/07/2020 Weeks Of Treatment: 22 Clustered Wound: Yes Photos Wound Measurements Length: (cm) 1 Width: (cm) 1 Depth: (cm) 0 Clustered Quantity: 1 Area: (cm) Volume: (cm) .2 % Reduction in Area: 91.9% % Reduction in Volume: 91.9% .2 Epithelialization: Large (67-100%) Tunneling: No 0.942 Undermining: No 0.188 Wound Description Classification: Grade 3 Wound Margin: Thickened Exudate Amount: Medium Exudate Type: Serosanguineous Exudate Color: red, brown Foul Odor After Cleansing: No Slough/Fibrino Yes Wound Bed Granulation Amount: Large (67-100%) Exposed Structure Granulation Quality: Red, Pink Fascia Exposed: No Mauney, [place] ([id number]) [id number] Page 7 of 9 Necrotic Amount: None Present (0%) Fat Layer (Subcutaneous Tissue) Exposed: Yes Tendon Exposed: No Muscle Exposed: No Joint Exposed: No Bone Exposed: No Periwound Skin Texture Texture Color No Abnormalities Noted: No No Abnormalities Noted: No Callus: Yes Atrophie Blanche: No Crepitus: No Cyanosis: No Excoriation: No Ecchymosis: No Induration: No Erythema: No Rash: No Hemosiderin Staining: No Scarring: No Mottled: No Pallor: No Moisture Rubor: No No Abnormalities  Noted: No Dry / Scaly: No Temperature / Pain Maceration: Yes Temperature: No Abnormality Tenderness on Palpation: Yes Treatment Notes Wound #1 (Calcaneus) Wound Laterality: Right Cleanser Soap and Water Discharge Instruction: May shower and wash wound with dial antibacterial soap  and water prior to dressing change. Wound Cleanser Discharge Instruction: Cleanse the wound with wound cleanser prior to applying a clean dressing using gauze sponges, not tissue or cotton balls. Peri-Wound Care Sween Lotion (Moisturizing lotion) Discharge Instruction: Apply moisturizing lotion as directed Topical Primary Dressing Sorbalgon AG Dressing 2x2 (in/in) Discharge Instruction: Apply to wound bed as instructed Secondary Dressing ABD Pad, 5x9 Discharge Instruction: Apply over primary dressing as directed. Woven Gauze Sponge, Non-Sterile 4x4 in Discharge Instruction: Apply over primary dressing as directed. heel ortho felt Discharge Instruction: apply in shoe to aid in offloading of heel and wound. Secured With Conforming Stretch Gauze Bandage Roll, Sterile 4x75 (in/in) Discharge Instruction: Secure with stretch gauze as directed. 107M Medipore H Soft Cloth Surgical T ape, 4 x 10 (in/yd) Discharge Instruction: Secure with tape as directed. Compression Wrap Compression Stockings Add-Ons Electronic Signature(s) Signed: 11/13/2022 4:14:43 PM By: Erenest Blank Entered By: Erenest Blank on 11/13/2022 11:08:13 Lehane, Maylie ([id number]) [id number] Page 8 of 9 -------------------------------------------------------------------------------- Wound Assessment Details Patient Name: Date of Service: KOLLINS, FENTER 11/13/2022 10:45 A M Medical Record Number: [id number] Patient Account Number: [id number] Date of Birth/Sex: Treating RN: [birth year] (57 y.o. Debby Bud Primary Care Marc Leichter: Stoney Bang Other Clinician: Referring Antawn Sison: Treating Quenisha Lovins/Extender: Sherryl Manges in Treatment: 22 Wound Status Wound Number: 4 Primary Etiology: Diabetic Wound/Ulcer of the Lower Extremity Wound Location: Right, Distal, Plantar Foot Wound Status: Healed - Epithelialized Wounding Event: Blister Notes: splitting the wounds into two wounds. Date Acquired: 11/08/2020 Comorbid History: Hypertension, Type II Diabetes, Osteomyelitis Weeks Of Treatment: 4 Clustered Wound: No Photos Wound Measurements Length: (cm) Width: (cm) Depth: (cm) Area: (cm) Volume: (cm) 0 % Reduction in Area: 100% 0 % Reduction in Volume: 100% 0 Epithelialization: Medium (34-66%) 0 0 Wound Description Classification: Grade 3 Wound Margin: Distinct, outline attached Exudate Amount: Medium Exudate Type: Serosanguineous Exudate Color: red, brown Foul Odor After Cleansing: No Slough/Fibrino No Wound Bed Granulation Amount: Large (67-100%) Exposed Structure Granulation Quality: Red Fascia Exposed: No Necrotic Amount: None Present (0%) Fat Layer (Subcutaneous Tissue) Exposed: Yes Tendon Exposed: No Muscle Exposed: No Joint Exposed: No Bone Exposed: No Periwound Skin Texture Texture Color No Abnormalities Noted: No No Abnormalities Noted: No Callus: Yes Atrophie Blanche: No Crepitus: No Cyanosis: No Excoriation: No Ecchymosis: No Induration: No Erythema: No Rash: No Hemosiderin Staining: No Scarring: No Mottled: No Pallor: No Moisture Rubor: No No Abnormalities Noted: No Dry / Scaly: No Maceration: No Treatment Notes Wound #4 (Foot) Wound Laterality: Plantar, Right, Distal Cleanser Blomberg, Azaliyah ([id number]) [id number] Page 9 of 9 Peri-Wound Care Topical Primary Dressing Secondary Dressing Secured With Compression Wrap Compression Stockings Add-Ons Electronic Signature(s) Signed: 11/13/2022 5:13:10 PM By: Deon Pilling RN, BSN Entered By: Deon Pilling on 11/13/2022  11:17:08 -------------------------------------------------------------------------------- Vitals Details Patient Name: Date of Service: EVERLEY, [name] 11/13/2022 10:45 A M Medical Record Number: [id number] Patient Account Number: [id number] Date of Birth/Sex: Treating RN: July 29, 1966 (57 y.o. F) Primary Care Wylma Tatem: Stoney Bang Other Clinician: Referring Rane Dumm: Treating Shamaya Kauer/Extender: Sherryl Manges in Treatment: 22 Vital Signs Time Taken: 10:56 Temperature (F): 98.1 Height (in): 65 Pulse (bpm): 105 Weight (lbs): 331 Respiratory Rate (breaths/min): 18 Body Mass Index (BMI): 55.1 Blood Pressure (mmHg): 125/84 Reference Range: 80 - 120 mg / dl Electronic Signature(s) Signed: 11/13/2022 4:14:43 PM By: Erenest Blank Entered By: Erenest Blank on 11/13/2022 10:57:08

## 2022-11-14 NOTE — Progress Notes (Signed)
Heaphy, [name] ([id number]) [id number] Page 1 of 9 Visit Report for 11/13/2022 Debridement Details Patient Name: Date of Service: [name], [name] 11/13/2022 10:45 A M Medical Record Number: [id number] Patient Account Number: [id number] Date of Birth/Sex: Treating RN: 12-Mar-1966 (57 y.o. F) Primary Care Provider: Stoney Bang Other Clinician: Referring Provider: Treating Provider/Extender: Sherryl Manges in Treatment: 22 Debridement Performed for Assessment: Wound #1 Right Calcaneus Performed By: Physician Ricard Dillon., MD Debridement Type: Debridement Severity of Tissue Pre Debridement: Fat layer exposed Level of Consciousness (Pre-procedure): Awake and Alert Pre-procedure Verification/Time Out Yes - 11:10 Taken: Start Time: 11:11 Pain Control: Lidocaine 5% topical ointment T Area Debrided (L x W): otal 1.2 (cm) x 1 (cm) = 1.2 (cm) Tissue and other material debrided: Viable, Non-Viable, Slough, Subcutaneous, Skin: Dermis , Skin: Epidermis, Slough Level: Skin/Subcutaneous Tissue Debridement Description: Excisional Instrument: Curette Bleeding: Minimum Hemostasis Achieved: Pressure End Time: 11:20 Procedural Pain: 0 Post Procedural Pain: 0 Response to Treatment: Procedure was tolerated well Level of Consciousness (Post- Awake and Alert procedure): Post Debridement Measurements of Total Wound Length: (cm) 1.2 Width: (cm) 1 Depth: (cm) 0.2 Volume: (cm) 0.188 Character of Wound/Ulcer Post Debridement: Improved Severity of Tissue Post Debridement: Fat layer exposed Post Procedure Diagnosis Same as Pre-procedure Electronic Signature(s) Signed: 11/13/2022 3:38:34 PM By: Linton Ham MD Entered By: Linton Ham on 11/13/2022 11:39:48 -------------------------------------------------------------------------------- HPI Details Patient Name: Date of Service: [name], [name] 11/13/2022 10:45 A M Medical Record Number:  [id number] Patient Account Number: [id number] Date of Birth/Sex: Treating RN: 1965-11-29 (57 y.o. F) Primary Care Provider: Stoney Bang Other Clinician: Referring Provider: Treating Provider/Extender: Sherryl Manges in Treatment: 22 History of Present Illness [name], [name] ([id number]) [id number] Page 2 of 9 HPI Description: 06-13-2022 upon evaluation today patient presents for evaluation of her right heel ulcer. She is having a tremendous amount of pain at this location. She tells me that her most recent hemoglobin A1c was 8.7 and that was on 08-23-2021. This ulcer on the heel has been present she states since February 2023 when she had an injection to the heel by podiatry. She states that she began to have increasing pain the wound opened and has been open ever since. 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 She had an x-ray in June if anything can although there did not appear to be any obvious evidence of osteomyelitis at that point. She subsequently has had OR debridement several times of the wound performed by podiatry. Patient does have a history of lymphedema of the lower extremities she is also a type II diabetic. 06-19-2022 upon evaluation today patient appears to be doing better in regard to the size of her leg which is significantly improved. With that being said she had a lot of drainage from the heel which is completely understandable considering what is going on at this time. There does not appear to be any evidence of active infection there is no warmth or irritation to the leg in general. With that being said the patient does have an issue here with the amount of drainage that is going on I definitely think Zetuvit is good to be the better way to go in place of ABD pads. Also think that she could potentially benefit from 3 times per week dressing changes but again right now we are going to stick with the to  change it today, change in her Friday, and then subsequently seeing where things stand next Wednesday. The other option would  be to change her to a different day for wound care having her come then say like a Tuesday Friday or Monday Thursday. 06-26-2022 upon evaluation patient's wound bed actually showed signs of doing well in regard to the overall size it was measuring slightly smaller. With that being said unfortunately the biggest issue we see is she still has a tremendous amount of drainage which is what could keep this from being able to use a total contact cast. For that reason I am did going to discuss with her today the possibility of trying Tubigrip to see if that could be of benefit for her. She voiced understanding and is in agreement with giving this a try. 07-03-2022 upon evaluation today patient unfortunately appears to be doing significantly worse at this point in regard to her swelling due to the fact that she was unable to really wear the Tubigrip. She tells me that it was cutting into her. With that being said she also did have an MRI this MRI revealed that she does have osteomyelitis in the heel this was actually just performed Monday, 25 September. This does show signs of "early osteomyelitis". Nonetheless this is still concerning to me. The wound also appears to be getting deeper in the center aspect of this which has me a little concerned as well. I do believe that she is likely going require an aggressive approach here to try to get things better. I discussed that in greater detail with her today. I will detailed in the plan. 07-17-2022 upon evaluation today patient appears to be doing poorly in regard to her wound. Unfortunately she does not show any signs of infection systemically though locally this seems to be doing worse with the wound actually being bigger than where it was previous. She did have an MRI that showed evidence of osteomyelitis actually recommended a referral to  ID she was evaluated infectious disease and they recommended referral to podiatry and to be honest have recommended amputation based on what the patient tells me today. With that being said this is something that she is not interested in at all to be perfectly honest. For that reason she wants to know what she should do and where she should go at this point that is where the majority of the conversation which was quite lengthy today went. Obviously understand her concern here but I think she is going to have to definitely get off this and likely this means she is going to need to come out of work. 10/18; Since the patient was the patient is using Hydrofera Blue on her right heel wound. She has a new larger open area superior to the wound. The skin on the bottom of her foot is completely macerated. I reviewed the infectious disease note on this patient from 10/9. They recommended keeping the patient on Delofloxacin 450 twice daily until follow-up tomorrow. The patient states she is not going back there as the only thing they wanted to do was "cut my leg off]. She has not seen podiatry. Her lab works shows a CRP markedly elevated at 240.2 a sedimentation rate of 96 the rest of her CBC is normal creatinine of 0.38 The patient has been to see her primary doctor who is Dr. Leonides Schanz [[facility]] In [place]. According the patient Dr. Loralee Pacas has ordered vancomycin and Zosyn to start on November 1. She is not taking the delofloxacin that was suggested by infectious disease. Very angry that they just wanted to consider her for an amputation  although I do not specifically see that stated in their note 07-31-2022 upon evaluation today patient still has a significant wound over her plantar aspect of her right l foot region. With that being said I am definitely concerned about the fact that the patient probably does need to be in hyperbaric oxygen therapy at this point. I think we should try to get this going as quickly as  possible. She actually is going to be set up with a PICC line next Wednesday and then following this we will have the vancomycin and Zosyn that she will be taking for 8 weeks according to what she tells me. I think this is definitely going to be beneficial and again the goal here is limb salvage. In combination with that I think the hyperbaric oxygen therapy would be ultimately significantly helpful for her. 08-07-2022 upon evaluation today patient appears to still be doing quite poorly overall she still has not gotten her antibiotics. She got her PICC line today which I assumed she will be getting the first dose of antibiotics as well during that time. With that being said unfortunately she did not get the antibiotics and in fact as I questioned her further she does not even know when or if that is going to be started this week. Again I am very concerned about this considering she already has a PICC line in place we do not want this to clot off on top of the fact that she should already be on these antibiotics she just never went to the ER for evaluation therefore they never got this started. At this point based on what I am seeing I really think that the ideal thing would be for us to see about getting her to the ER for further evaluation and treatment to see if they can get this started for her as soon as possible. The patient voiced understanding and is in agreement with the plan. I gave her recommendations for what should be done also called [facility] infusion therapy in order to see if they would be the ones that seem to be available for her IV infusions but they tell me that they no longer do this that is apparently where the orders were sent to for her infusion therapy. That was by her primary care provider. 08-21-2022 upon evaluation today patient's wound on the bottom of the heel and foot area as well as the toe appear to be doing significantly better. Fortunately I do not see any evidence of  infection locally or systemically and everything is measuring much smaller than where we have been. The wound in the left gluteal region also is dramatically improved compared to last time she was here. She has started the antibiotics this is cefepime and vancomycin. She did have a somewhat high trough and therefore they have her hold that today and they are to recheck in the morning I believe she told me. Nonetheless with the antibiotics going she seems to be doing significantly better which is great news. 08-28-2022 upon evaluation today patient appears to be doing somewhat better in regard to her foot there is definitely less drainage than what we have seen in the past. I do believe the antibiotics are helping. With that being said she is also been approved for hyperbaric oxygen therapy and I think as soon as we get this started the better. I discussed that with her today as well. With that being said I do think that the last thing she needs is  her chest x-ray when she has that done will be ready to start her into treatment. She is going to try to go get that today when she leaves which I think would be ideal. 09-11-2022 upon evaluation today patient appears to be doing well with regard to her foot ulcer. This is showing signs of no digression and she does not appear to be having as much drainage as before though it still very wet is not nearly the extreme of what it was previous. Fortunately there does not appear to be any signs of active infection locally nor systemically at this point which is great news. Patient did have her chest x-ray as well as EKG and she is actually approved and completely cleared for HBO therapy from both a clinical standpoint as well as an insurance standpoint. 09-25-2022 upon evaluation today patient appears to be doing well in regard to her foot ulcer. Unfortunately she is not doing as well when it comes to her hyperbaric treatments. She is having a lot of trouble with  claustrophobia. She tells me in general that she is not sure she is good to be able to continue. 10-09-2022 upon evaluation today patient's wounds actually are showing signs of excellent improvement which is great news. Fortunately I do not see any evidence of infection locally or systemically which is great news and overall I am extremely pleased with where things are currently. I do think that she is making good progress she does tell me at this point today she does not want to go forward with a hyperbaric oxygen therapy. 10-16-2022 upon evaluation today patient appears to be doing excellent in regard to her foot ulcer. This is actually showing signs of excellent improvement. She still does not allow for any sharp debridement but the good news is this is doing much better and does not even need it at the moment. Nonetheless as far as sharp debridement is concerned this has been too painful for really not been able to do that all along. Larrivee, [name] ([id number]) [id number] Page 3 of 9 10-23-2022 upon evaluation today patient actually is making excellent progress. I am extremely pleased with where we stand I have considered even seeing about putting her in the total contact cast but to be honest she is doing so well currently and still with having some drainage I feel like it is better for her to be able to change it then to be locked up in the cast for a week at a time. She has voiced understanding as well. Overall though I feel like you are making some really good progress here. 10-30-2022 upon evaluation today patient appears to be doing excellent in regard to her foot ulcers. Both are showing signs of improvement which is great news and overall I feel like that they are measuring smaller, looking better, and I see no signs of resurgence of the infection which is great news. Overall I am extremely pleased at this point. 2/7; the patient has 2 wounds which are remanence of the  large wound on the right foot. This was a diabetic wound with underlying osteomyelitis she has completed antibiotics. Today she has the open area on the distal calcaneus and an area on the right midfoot which is eschared. She has been using silver alginate and offloading this in a regular running shoe. 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 Electronic Signature(s) Signed: 11/13/2022 3:38:34 PM By: Leanord Hawking,  Casimiro Needle MD Entered By: Baltazar Najjar on 11/13/2022 11:44:03 -------------------------------------------------------------------------------- Paring/cutting 1 benign hyperkeratotic lesion Details Patient Name: Date of Service: [name], [name] 11/13/2022 10:45 A M Medical Record Number: [id number] Patient Account Number: [id number] Date of Birth/Sex: Treating RN: 11/01/1965 (57 y.o. [name] Primary Care Provider: [name] Other Clinician: Referring Provider: Treating Provider/Extender: Delora Fuel in Treatment: 22 Procedure Performed for: Non-Wound Location Performed By: Physician Maxwell Caul., MD Post Procedure Diagnosis Same as Pre-procedure Notes paring of the callous using a curette #5 to right mid plantar foot area. Electronic Signature(s) Signed: 11/13/2022 3:38:34 PM By: Baltazar Najjar MD Signed: 11/13/2022 5:13:10 PM By: Shawn Stall RN, BSN Entered By: Shawn Stall on 11/13/2022 11:18:32 -------------------------------------------------------------------------------- Physical Exam Details Patient Name: Date of Service: [name], [name] 11/13/2022 10:45 A M Medical Record Number: [id number] Patient Account Number: [id number] Date of Birth/Sex: Treating RN: 19-Jul-1966 (57 y.o. F) Primary Care Provider: [name] Other Clinician: Referring Provider: Treating Provider/Extender: Delora Fuel in Treatment: 22 Constitutional Sitting or standing Blood  Pressure is within target range for patient.. Pulse regular and within target range for patient.Marland Kitchen Respirations regular, non-labored and within target range.. Temperature is normal and within the target range for the patient.Marland Kitchen Appears in no distress. Notes Wound exam; the patient has a small open area on the distal right calcaneus. Also an eschared area on the plantar foot just proximal to the original wound. All of these were part of the original large wound. I used a #5 curette to pare back some of the callus on the plantar foot wound and I could not identify anything that is open. The heel wound has some raised wet skin around it which I removed with a #5 curette some subcutaneous debris. Concerning thing is the amount Aldrete, Alleyah ([id number]) [id number] Page 4 of 9 of pain in the distal heel to palpation. There is no evidence of infection Electronic Signature(s) Signed: 11/13/2022 3:38:34 PM By: Baltazar Najjar MD Entered By: Baltazar Najjar on 11/13/2022 11:46:03 -------------------------------------------------------------------------------- Physician Orders Details Patient Name: Date of Service: [name], [name] 11/13/2022 10:45 A M Medical Record Number: [id number] Patient Account Number: [id number] Date of Birth/Sex: Treating RN: 10-06-1966 (57 y.o. 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 Leonard Schwartz Wednesday 1030 11/27/2022 Return A Anesthetic (In clinic) Topical Lidocaine 5% applied  to wound bed Bathing/ Shower/ Hygiene May shower with protection but do not get wound dressing(s) wet. Protect dressing(s) with water repellant cover (for example, large plastic bag) or a cast cover and may then take shower. Edema Control - Lymphedema / SCD / Other Elevate legs to the level of the heart or above for 30 minutes daily and/or when sitting for 3-4 times a day throughout the day. Avoid standing for long periods of time. Off-Loading Other: - Minimize walking and standing as much as possible to right foot to aid in offloading to wound. While at work use the wheelchair for mobility. felt in shoe to aid in offloading wound and heel. Wound Treatment Wound #1 - Calcaneus Wound Laterality: Right Cleanser: Soap and Water 1 x Per Day/30 Days Discharge  Instructions: May shower and wash wound with dial antibacterial soap and water prior to dressing change. Cleanser: Wound Cleanser (Generic) 1 x Per Day/30 Days Discharge Instructions: Cleanse the wound with wound cleanser prior to applying a clean dressing using gauze sponges, not tissue or cotton balls. Peri-Wound Care: Sween Lotion (Moisturizing lotion) 1 x Per Day/30 Days Discharge Instructions: Apply moisturizing lotion as directed Prim Dressing: Sorbalgon AG Dressing 2x2 (in/in) (DME) (Generic) 1 x Per Day/30 Days ary Discharge Instructions: Apply to wound bed as instructed Secondary Dressing: ABD Pad, 5x9 (DME) (Generic) 1 x Per Day/30 Days Discharge Instructions: Apply over primary dressing as directed. Secondary Dressing: Woven Gauze Sponge, Non-Sterile 4x4 in (DME) (Generic) 1 x Per Day/30 Days Discharge Instructions: Apply over primary dressing as directed. Secondary Dressing: heel ortho felt 1 x Per Day/30 Days Discharge Instructions: apply in shoe to aid in offloading of heel and wound. Secured With: Web designer, Sterile 4x75 (in/in) (DME) (Generic) 1 x Per Day/30 Days Scarfo, Faydra ([id number])  [id number] Page 5 of 9 Discharge Instructions: Secure with stretch gauze as directed. Secured With: 76M Medipore H Soft Cloth Surgical T ape, 4 x 10 (in/yd) (Generic) 1 x Per Day/30 Days Discharge Instructions: Secure with tape as directed. Electronic Signature(s) Signed: 11/13/2022 3:38:34 PM By: Baltazar Najjar MD Signed: 11/13/2022 5:13:10 PM By: Shawn Stall RN, BSN Entered By: Shawn Stall on 11/13/2022 11:28:31 -------------------------------------------------------------------------------- Problem List Details Patient Name: Date of Service: [name], [name] 11/13/2022 10:45 A M Medical Record Number: [id number] Patient Account Number: [id number] Date of Birth/Sex: Treating RN: 13-Oct-1965 (57 y.o. [name], Yvonne Kendall Primary Care Provider: [name] Other Clinician: Referring Provider: Treating Provider/Extender: Delora Fuel in Treatment: 22 Active Problems ICD-10 Encounter Code Description Active Date MDM Diagnosis [phone] Other chronic osteomyelitis, right ankle and foot 08/28/2022 No Yes E11.621 Type 2 diabetes mellitus with foot ulcer 06/12/2022 No Yes L97.412 Non-pressure chronic ulcer of right heel and midfoot with fat layer exposed 06/12/2022 No Yes I89.0 Lymphedema, not elsewhere classified 06/12/2022 No Yes Inactive Problems Resolved Problems Electronic Signature(s) Signed: 11/13/2022 3:38:34 PM By: Baltazar Najjar MD Entered By: Baltazar Najjar on 11/13/2022 11:39:12 -------------------------------------------------------------------------------- Progress Note Details Patient Name: Date of Service: RAYNISHA, AVILLA 11/13/2022 10:45 A M Medical Record Number: [id number] Patient Account Number: [id number] Date of Birth/Sex: Treating RN: Dec 22, 1965 (57 y.o. F) Primary Care Provider: [name] Other Clinician: Referring Provider: Treating Provider/Extender: Jen Mow, Verner ([id number])  [phone].pdf Page 6 of 9 Weeks in Treatment: 22 Subjective History of Present Illness (HPI) 06-13-2022 upon evaluation today patient presents for evaluation of her right heel ulcer. She is having a tremendous amount of pain at this location. She tells me that her most recent hemoglobin A1c was 8.7 and that was on 08-23-2021. This ulcer on the heel has been present she states since February 2023 when she had an injection to the heel by podiatry. She states that she began to have increasing pain the wound opened and has been open ever since. 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 She had an x-ray in June if anything can although there did not appear to be any obvious evidence of osteomyelitis at that point. She subsequently has had OR debridement several times of the wound performed by podiatry. Patient does have a history of lymphedema of the lower extremities she is also a type II diabetic. 06-19-2022 upon evaluation today patient appears to be doing better in regard to the size  of her leg which is significantly improved. With that being said she had a lot of drainage from the heel which is completely understandable considering what is going on at this time. There does not appear to be any evidence of active infection there is no warmth or irritation to the leg in general. With that being said the patient does have an issue here with the amount of drainage that is going on I definitely think Zetuvit is good to be the better way to go in place of ABD pads. Also think that she could potentially benefit from 3 times per week dressing changes but again right now we are going to stick with the to change it today, change in her Friday, and then subsequently seeing where things stand next Wednesday. The other option would be to change her to a different day for wound care having her come then say like a Tuesday Friday or Monday Thursday. 06-26-2022  upon evaluation patient's wound bed actually showed signs of doing well in regard to the overall size it was measuring slightly smaller. With that being said unfortunately the biggest issue we see is she still has a tremendous amount of drainage which is what could keep this from being able to use a total contact cast. For that reason I am did going to discuss with her today the possibility of trying Tubigrip to see if that could be of benefit for her. She voiced understanding and is in agreement with giving this a try. 07-03-2022 upon evaluation today patient unfortunately appears to be doing significantly worse at this point in regard to her swelling due to the fact that she was unable to really wear the Tubigrip. She tells me that it was cutting into her. With that being said she also did have an MRI this MRI revealed that she does have osteomyelitis in the heel this was actually just performed Monday, 25 September. This does show signs of "early osteomyelitis". Nonetheless this is still concerning to me. The wound also appears to be getting deeper in the center aspect of this which has me a little concerned as well. I do believe that she is likely going require an aggressive approach here to try to get things better. I discussed that in greater detail with her today. I will detailed in the plan. 07-17-2022 upon evaluation today patient appears to be doing poorly in regard to her wound. Unfortunately she does not show any signs of infection systemically though locally this seems to be doing worse with the wound actually being bigger than where it was previous. She did have an MRI that showed evidence of osteomyelitis actually recommended a referral to ID she was evaluated infectious disease and they recommended referral to podiatry and to be honest have recommended amputation based on what the patient tells me today. With that being said this is something that she is not interested in at all to  be perfectly honest. For that reason she wants to know what she should do and where she should go at this point that is where the majority of the conversation which was quite lengthy today went. Obviously understand her concern here but I think she is going to have to definitely get off this and likely this means she is going to need to come out of work. 10/18; Since the patient was the patient is using Hydrofera Blue on her right heel wound. She has a new larger open area superior to the wound. The skin on  the bottom of her foot is completely macerated. I reviewed the infectious disease note on this patient from 10/9. They recommended keeping the patient on Delofloxacin 450 twice daily until follow-up tomorrow. The patient states she is not going back there as the only thing they wanted to do was "cut my leg off]. She has not seen podiatry. Her lab works shows a CRP markedly elevated at 240.2 a sedimentation rate of 96 the rest of her CBC is normal creatinine of 0.38 The patient has been to see her primary doctor who is Dr. Melonie Florida [SPo] In [place]. According the patient Dr. Mylinda Latina has ordered vancomycin and Zosyn to start on November 1. She is not taking the delofloxacin that was suggested by infectious disease. Very angry that they just wanted to consider her for an amputation although I do not specifically see that stated in their note 07-31-2022 upon evaluation today patient still has a significant wound over her plantar aspect of her right l foot region. With that being said I am definitely concerned about the fact that the patient probably does need to be in hyperbaric oxygen therapy at this point. I think we should try to get this going as quickly as possible. She actually is going to be set up with a PICC line next Wednesday and then following this we will have the vancomycin and Zosyn that she will be taking for 8 weeks according to what she tells me. I think this is definitely going to be  beneficial and again the goal here is limb salvage. In combination with that I think the hyperbaric oxygen therapy would be ultimately significantly helpful for her. 08-07-2022 upon evaluation today patient appears to still be doing quite poorly overall she still has not gotten her antibiotics. She got her PICC line today which I assumed she will be getting the first dose of antibiotics as well during that time. With that being said unfortunately she did not get the antibiotics and in fact as I questioned her further she does not even know when or if that is going to be started this week. Again I am very concerned about this considering she already has a PICC line in place we do not want this to clot off on top of the fact that she should already be on these antibiotics she just never went to the ER for evaluation therefore they never got this started. At this point based on what I am seeing I really think that the ideal thing would be for [place] to see about getting her to the ER for further evaluation and treatment to see if they can get this started for her as soon as possible. The patient voiced understanding and is in agreement with the plan. I gave her recommendations for what should be done also called [facility] infusion therapy in order to see if they would be the ones that seem to be available for her IV infusions but they tell me that they no longer do this that is apparently where the orders were sent to for her infusion therapy. That was by her primary care provider. 08-21-2022 upon evaluation today patient's wound on the bottom of the heel and foot area as well as the toe appear to be doing significantly better. Fortunately I do not see any evidence of infection locally or systemically and everything is measuring much smaller than where we have been. The wound in the left gluteal region also is dramatically improved compared to last time she was  here. She has started the antibiotics this is cefepime  and vancomycin. She did have a somewhat high trough and therefore they have her hold that today and they are to recheck in the morning I believe she told me. Nonetheless with the antibiotics going she seems to be doing significantly better which is great news. 08-28-2022 upon evaluation today patient appears to be doing somewhat better in regard to her foot there is definitely less drainage than what we have seen in the past. I do believe the antibiotics are helping. With that being said she is also been approved for hyperbaric oxygen therapy and I think as soon as we get this started the better. I discussed that with her today as well. With that being said I do think that the last thing she needs is her chest x-ray when she has that done will be ready to start her into treatment. She is going to try to go get that today when she leaves which I think would be ideal. 09-11-2022 upon evaluation today patient appears to be doing well with regard to her foot ulcer. This is showing signs of no digression and she does not appear to be having as much drainage as before though it still very wet is not nearly the extreme of what it was previous. Fortunately there does not appear to be any signs of active infection locally nor systemically at this point which is great news. Patient did have her chest x-ray as well as EKG and she is actually approved and completely cleared for HBO therapy from both a clinical standpoint as well as an insurance standpoint. 09-25-2022 upon evaluation today patient appears to be doing well in regard to her foot ulcer. Unfortunately she is not doing as well when it comes to her hyperbaric treatments. She is having a lot of trouble with claustrophobia. She tells me in general that she is not sure she is good to be able to continue. 10-09-2022 upon evaluation today patient's wounds actually are showing signs of excellent improvement which is great news. Fortunately I do not see  any evidence of infection locally or systemically which is great news and overall I am extremely pleased with where things are currently. I do think that she is making good progress she does tell me at this point today she does not want to go forward with a hyperbaric oxygen therapy. Biswell, [name] ([id number]) [id number] Page 7 of 9 10-16-2022 upon evaluation today patient appears to be doing excellent in regard to her foot ulcer. This is actually showing signs of excellent improvement. She still does not allow for any sharp debridement but the good news is this is doing much better and does not even need it at the moment. Nonetheless as far as sharp debridement is concerned this has been too painful for really not been able to do that all along. 10-23-2022 upon evaluation today patient actually is making excellent progress. I am extremely pleased with where we stand I have considered even seeing about putting her in the total contact cast but to be honest she is doing so well currently and still with having some drainage I feel like it is better for her to be able to change it then to be locked up in the cast for a week at a time. She has voiced understanding as well. Overall though I feel like you are making some really good progress here. 10-30-2022 upon evaluation today patient appears to be doing excellent in regard to  her foot ulcers. Both are showing signs of improvement which is great news and overall I feel like that they are measuring smaller, looking better, and I see no signs of resurgence of the infection which is great news. Overall I am extremely pleased at this point. 2/7; the patient has 2 wounds which are remanence of the large wound on the right foot. This was a diabetic wound with underlying osteomyelitis she has completed antibiotics. Today she has the open area on the distal calcaneus and an area on the right midfoot which is eschared. She has been using  silver alginate and offloading this in a regular running shoe. 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 Objective Constitutional Sitting or standing Blood Pressure is within target range for patient.. Pulse regular and within target range for patient.Marland Kitchen. Respirations regular, non-labored and within target range.. Temperature is normal and within the target range for the patient.Marland Kitchen. Appears in no distress. Vitals Time Taken: 10:56 AM, Height: 65 in, Weight: 331 lbs, BMI: 55.1, Temperature: 98.1 F, Pulse: 105 bpm, Respiratory Rate: 18 breaths/min, Blood Pressure: 125/84 mmHg. General Notes: Wound exam; the patient has a small open area on the distal right calcaneus. Also an eschared area on the plantar foot just proximal to the original wound. All of these were part of the original large wound. I used a #5 curette to pare back some of the callus on the plantar foot wound and I could not identify anything that is open. The heel wound has some raised wet skin around it which I removed with a #5 curette some subcutaneous debris. Concerning thing is the amount of pain in the distal heel to palpation. There is no evidence of infection Integumentary (Hair, Skin) Wound #1 status is Open. Original cause of wound was Gradually Appeared. The date acquired was: 11/07/2020. The wound has been in treatment 22 weeks. The wound is located on the Right Calcaneus. The wound measures 1.2cm length x 1cm width x 0.2cm depth; 0.942cm^2 area and 0.188cm^3 volume. There is Fat Layer (Subcutaneous Tissue) exposed. There is no tunneling or undermining noted. There is a medium amount of serosanguineous drainage noted. The wound margin is thickened. There is large (67-100%) red, pink granulation within the wound bed. There is no necrotic tissue within the wound bed. The periwound skin appearance exhibited: Callus, Maceration. The periwound skin appearance did  not exhibit: Crepitus, Excoriation, Induration, Rash, Scarring, Dry/Scaly, Atrophie Blanche, Cyanosis, Ecchymosis, Hemosiderin Staining, Mottled, Pallor, Rubor, Erythema. Periwound temperature was noted as No Abnormality. The periwound has tenderness on palpation. Wound #4 status is Healed - Epithelialized. Original cause of wound was Blister. The date acquired was: 11/08/2020. The wound has been in treatment 4 weeks. The wound is located on the Right,Distal,Plantar Foot. The wound measures 0cm length x 0cm width x 0cm depth; 0cm^2 area and 0cm^3 volume. There is Fat Layer (Subcutaneous Tissue) exposed. There is a medium amount of serosanguineous drainage noted. The wound margin is distinct with the outline attached to the wound base. There is large (67-100%) red granulation within the wound bed. There is no necrotic tissue within the wound bed. The periwound skin appearance exhibited: Callus. The periwound skin appearance did not exhibit: Crepitus, Excoriation, Induration, Rash, Scarring, Dry/Scaly, Maceration, Atrophie Blanche, Cyanosis, Ecchymosis, Hemosiderin Staining, Mottled, Pallor, Rubor, Erythema. Assessment Active Problems ICD-10 Other chronic osteomyelitis, right ankle and foot Type 2 diabetes mellitus with foot ulcer Non-pressure chronic ulcer of  right heel and midfoot with fat layer exposed Lymphedema, not elsewhere classified Procedures Wound #1 Pre-procedure diagnosis of Wound #1 is a Diabetic Wound/Ulcer of the Lower Extremity located on the Right Calcaneus .Severity of Tissue Pre Debridement is: Fat layer exposed. There was a Excisional Skin/Subcutaneous Tissue Debridement with a total area of 1.2 sq cm performed by Maxwell Caulobson, Mikhaela Zaugg G., MD. With the following instrument(s): Curette to remove Viable and Non-Viable tissue/material. Material removed includes Subcutaneous Tissue, Slough, Skin: Dermis, and Skin: Epidermis after achieving pain control using Lidocaine 5% topical ointment.  A time out was conducted at 11:10, prior to the start of the procedure. A Minimum amount of bleeding was controlled with Pressure. The procedure was tolerated well with a pain level of 0 throughout and a pain level of 0 following the procedure. Post Debridement Measurements: 1.2cm length x 1cm width x 0.2cm depth; 0.188cm^3 volume. Character of Wound/Ulcer Post Debridement is improved. Severity of Tissue Post Debridement is: Fat layer exposed. Post procedure Diagnosis Wound #1: Same as Pre-Procedure Salek, Alayssa ([id number]) [id number] Page 8 of 9 A Paring/cutting 1 benign hyperkeratotic lesion procedure was performed. by Maxwell Caulobson, Jamonte Curfman G., MD. Post procedure Diagnosis Wound #: Same as Pre-Procedure Notes: paring of the callous using a curette #5 to right mid plantar foot area. Plan Follow-up Appointments: Return Appointment in 2 weeks. [facility] Wednesday 1030 11/27/2022 Anesthetic: (In clinic) Topical Lidocaine 5% applied to wound bed Bathing/ Shower/ Hygiene: May shower with protection but do not get wound dressing(s) wet. Protect dressing(s) with water repellant cover (for example, large plastic bag) or a cast cover and may then take shower. Edema Control - Lymphedema / SCD / Other: Elevate legs to the level of the heart or above for 30 minutes daily and/or when sitting for 3-4 times a day throughout the day. Avoid standing for long periods of time. Off-Loading: Other: - Minimize walking and standing as much as possible to right foot to aid in offloading to wound. While at work use the wheelchair for mobility. felt in shoe to aid in offloading wound and heel. WOUND #1: - Calcaneus Wound Laterality: Right Cleanser: Soap and Water 1 x Per Day/30 Days Discharge Instructions: May shower and wash wound with dial antibacterial soap and water prior to dressing change. Cleanser: Wound Cleanser (Generic) 1 x Per Day/30 Days Discharge Instructions: Cleanse the wound with  wound cleanser prior to applying a clean dressing using gauze sponges, not tissue or cotton balls. Peri-Wound Care: Sween Lotion (Moisturizing lotion) 1 x Per Day/30 Days Discharge Instructions: Apply moisturizing lotion as directed Prim Dressing: Sorbalgon AG Dressing 2x2 (in/in) (DME) (Generic) 1 x Per Day/30 Days ary Discharge Instructions: Apply to wound bed as instructed Secondary Dressing: ABD Pad, 5x9 (DME) (Generic) 1 x Per Day/30 Days Discharge Instructions: Apply over primary dressing as directed. Secondary Dressing: Woven Gauze Sponge, Non-Sterile 4x4 in (DME) (Generic) 1 x Per Day/30 Days Discharge Instructions: Apply over primary dressing as directed. Secondary Dressing: heel ortho felt 1 x Per Day/30 Days Discharge Instructions: apply in shoe to aid in offloading of heel and wound. Secured With: Web designerConforming Stretch Gauze Bandage Roll, Sterile 4x75 (in/in) (DME) (Generic) 1 x Per Day/30 Days Discharge Instructions: Secure with stretch gauze as directed. Secured With: 73M Medipore H Soft Cloth Surgical T ape, 4 x 10 (in/yd) (Generic) 1 x Per Day/30 Days Discharge Instructions: Secure with tape as directed. 1. We continued his silver alginate to the plantar heel wound which is her only remaining wound. Attempted to offload  these areas more aggressively. Our intake nurse did not seem to think she would be able to manage with any form of heel off loader 2. I think the cause of the pain in the distal heel is not the wound I think this is probably some form of fasciitis after discussing this with her. She tells me that she actually wheels herself around in a wheelchair when she is at work and has limited activity at home Electronic Signature(s) Signed: 11/13/2022 3:38:34 PM By: Baltazar Najjar MD Entered By: Baltazar Najjar on 11/13/2022 11:48:13 -------------------------------------------------------------------------------- SuperBill Details Patient Name: Date of Service: GEORGANA, [name]  11/13/2022 Medical Record Number: [id number] Patient Account Number: [id number] Date of Birth/Sex: Treating RN: [birth year] (57 y.o. [name] Primary Care Provider: [name] Other Clinician: Referring Provider: Treating Provider/Extender: Delora Fuel in Treatment: 22 Diagnosis Coding ICD-10 Codes Code Description [phone] Other chronic osteomyelitis, right ankle and foot E11.621 Type 2 diabetes mellitus with foot ulcer L97.412 Non-pressure chronic ulcer of right heel and midfoot with fat layer exposed Larios, Lillyen ([id number]) [id number] Page 9 of 9 I89.0 Lymphedema, not elsewhere classified L84 Corns and callosities Facility Procedures : CPT4 Code: [id number] Description: 11042 - DEB SUBQ TISSUE 20 SQ CM/< ICD-10 Diagnosis Description L97.412 Non-pressure chronic ulcer of right heel and midfoot with fat layer exposed Modifier: Quantity: 1 : CPT4 Code: [id number] Description: 11055 - PARE BENIGN LES; SGL ICD-10 Diagnosis Description L84 Corns and callosities Modifier: 59 Quantity: 1 Physician Procedures : CPT4 Code Description Modifier [id number] 11042 - WC PHYS SUBQ TISS 20 SQ CM ICD-10 Diagnosis Description L97.412 Non-pressure chronic ulcer of right heel and midfoot with fat layer exposed Quantity: 1 : [id number] 11055 - WC PHYS PARE BENIGN LES; SGL 59 ICD-10 Diagnosis Description L84 Corns and callosities Quantity: 1 Electronic Signature(s) Signed: 11/13/2022 3:38:34 PM By: Baltazar Najjar MD Signed: 11/13/2022 5:13:10 PM By: Shawn Stall RN, BSN Entered By: Shawn Stall on 11/13/2022 11:51:25

## 2022-11-25 NOTE — Progress Notes (Signed)
[name], [name] Reichmann ([id number].pdf Page 1 of 5 Visit Report for 10/23/2022 Arrival Information Details Patient Name: Date of Service: [name], [name] 10/23/2022 10:00 A M Medical Record Number: [id number] Patient Account Number: [id number] Date of Birth/Sex: Treating RN: [birth year][name] (57 y.o. F) Primary Care [name]: [name] Other Clinician: Referring [name]: Treating [name]/Extender: [name] in Treatment: 53 Visit Information History Since Last Visit Added or deleted any medications: No Patient Arrived: Ambulatory Any new allergies or adverse reactions: No Arrival Time: 10:33 Had a fall or experienced change in No Accompanied By: son activities of daily living that may affect Transfer Assistance: None risk of falls: Patient Identification Verified: Yes Signs or symptoms of abuse/neglect since last visito No Secondary Verification Process Completed: Yes Hospitalized since last visit: No Patient Requires Transmission-Based Precautions: No Implantable device outside of the clinic excluding No Patient Has Alerts: Yes cellular tissue based products placed in the center Patient Alerts: PICC in left arm since last visit: Has Dressing in Place as Prescribed: Yes Pain Present Now: Yes Electronic Signature(s) Signed: 10/23/2022 4:25:20 PM By: [name] Entered By: [name] on 10/23/2022 10:35:40 -------------------------------------------------------------------------------- Lower Extremity Assessment Details Patient Name: Date of Service: [name], [name] 10/23/2022 10:00 A M Medical Record Number: [id number] Patient Account Number: [id number] Date of Birth/Sex: Treating RN: [name] (57 y.o. F) Primary Care [name]: [name] Other Clinician: Referring [name]: Treating [name]/Extender: Matt Holmes Weeks in Treatment: 19 Edema Assessment Assessed: [Left: No] [Right: No] Edema: [Left: N]  [Right: o] Calf Left: Right: Point of Measurement: 32 cm From Medial Instep 56 cm Ankle Left: Right: Point of Measurement: 10 cm From Medial Instep 33 cm Electronic Signature(s) Signed: 10/23/2022 4:25:20 PM By: [name] Entered By: [name] on 10/23/2022 10:44:22 [name], [name] ([id number] Page 2 of 5 -------------------------------------------------------------------------------- Multi-Disciplinary Care Plan Details Patient Name: Date of Service: [name], [name] 10/23/2022 10:00 A M Medical Record Number: [id number] Patient Account Number: [id number] Date of Birth/Sex: Treating RN: [name][birth year] (57 y.o. [name], [name] Primary Care [name]: [name] Other Clinician: Referring [name]: Treating [name]/Extender: [name] in Treatment: 4 Active Inactive Wound/Skin Impairment Nursing Diagnoses: Impaired tissue integrity Knowledge deficit related to ulceration/compromised skin integrity Goals: Patient will have a decrease in wound volume by X% from date: (specify in notes) Date Initiated: 06/12/2022 Target Resolution Date: 11/08/2022 Goal Status: Active Patient/caregiver will verbalize understanding of skin care regimen Date Initiated: 06/12/2022 Target Resolution Date: 11/08/2022 Goal Status: Active Ulcer/skin breakdown will have a volume reduction of 30% by week 4 Date Initiated: 06/12/2022 Date Inactivated: 07/31/2022 Target Resolution Date: 08/03/2022 Goal Status: Unmet Unmet Reason: dx with osteomyelitis. Ulcer/skin breakdown will have a volume reduction of 50% by week 8 Date Initiated: 06/12/2022 Date Inactivated: 10/16/2022 Target Resolution Date: 10/05/2022 Unmet Reason: see wound Goal Status: Unmet measurement. IV antibiotics Interventions: Assess patient/caregiver ability to obtain necessary supplies Assess patient/caregiver ability to perform ulcer/skin care regimen upon admission and as  needed Assess ulceration(s) every visit Notes: Electronic Signature(s) Signed: 11/25/2022 10:40:58 AM By: [name] Hammock RN Entered By: [name] on 10/23/2022 11:10:39 -------------------------------------------------------------------------------- Pain Assessment Details Patient Name: Date of Service: [name], [name] 10/23/2022 10:00 A M Medical Record Number: [id number] Patient Account Number: [id number] Date of Birth/Sex: Treating RN: [name] (56 y.o. F) Primary Care [name]: [name] Other Clinician: Referring [name]: Treating [name]: [name] in Treatment: 92 Active Problems Location of Pain Severity and Description of Pain Patient Has Paino Yes Site  Locations Pain Location: [place], [name] ([id number] Page 3 of 5 Pain Location: Pain in Ulcers Rate the pain. Current Pain Level: 5 Pain Management and Medication Current Pain Management: Electronic Signature(s) Signed: 10/23/2022 4:25:20 PM By: [name] Entered By: [name] on 10/23/2022 10:36:18 -------------------------------------------------------------------------------- Wound Assessment Details Patient Name: Date of Service: [name], [name] 10/23/2022 10:00 A M Medical Record Number: [id number] Patient Account Number: [id number] Date of Birth/Sex: Treating RN: [birth year][name] (57 y.o. F) Primary Care [name]: [name] Other Clinician: Referring [name]: Treating [name]/Extender: [name] in Treatment: 19 Wound Status Wound Number: 1 Primary Etiology: Diabetic Wound/Ulcer of the Lower Extremity Wound Location: Right Calcaneus Wound Status: Open Wounding Event: Gradually Appeared Comorbid History: Hypertension, Type II Diabetes, Osteomyelitis Date Acquired: 11/07/2020 Weeks Of Treatment: 19 Clustered Wound: Yes Photos Wound Measurements Length: (cm) Width: (cm) Depth: (cm) Clustered  Quantity: Area: (cm) Volume: (cm) 1.2 % Reduction in Area: 88.7% 1.4 % Reduction in Volume: 88.6% 0.2 Epithelialization: Large (67-100%) 1 Tunneling: No 1.319 Undermining: No 0.264 Wound Description [name], [name] ([id number]) Classification: Grade 3 Wound Margin: Thickened Exudate Amount: Medium Exudate Type: Serosanguineous Exudate Color: red, brown [phone].pdf Page 4 of 5 Foul Odor After Cleansing: No Slough/Fibrino Yes Wound Bed Granulation Amount: Large (67-100%) Exposed Structure Granulation Quality: Red, Pink Fascia Exposed: No Necrotic Amount: Small (1-33%) Fat Layer (Subcutaneous Tissue) Exposed: Yes Tendon Exposed: No Muscle Exposed: No Joint Exposed: No Bone Exposed: No Periwound Skin Texture Texture Color No Abnormalities Noted: No No Abnormalities Noted: No Callus: Yes Atrophie Blanche: No Crepitus: No Cyanosis: No Excoriation: No Ecchymosis: No Induration: No Erythema: No Rash: No Hemosiderin Staining: No Scarring: No Mottled: No Pallor: No Moisture Rubor: No No Abnormalities Noted: No Dry / Scaly: No Temperature / Pain Maceration: No Temperature: No Abnormality Tenderness on Palpation: Yes Electronic Signature(s) Signed: 10/23/2022 4:25:20 PM By: [name] Entered By: [name] on 10/23/2022 10:48:02 -------------------------------------------------------------------------------- Wound Assessment Details Patient Name: Date of Service: [name], [name] 10/23/2022 10:00 A M Medical Record Number: [id number] Patient Account Number: [id number] Date of Birth/Sex: Treating RN: [birth year] (57 y.o. F) Primary Care Teneka Malmberg: [name] Other Clinician: Referring Marino Rogerson: Treating Szymon Foiles/Extender: [name] in Treatment: 19 Wound Status Wound Number: 4 Primary Etiology: Diabetic Wound/Ulcer of the Lower Extremity Wound Location: Right, Distal, Plantar Foot Wound Status:  Open Wounding Event: Blister Notes: splitting the wounds into two wounds. Date Acquired: 11/08/2020 Comorbid History: Hypertension, Type II Diabetes, Osteomyelitis Weeks Of Treatment: 1 Clustered Wound: No Photos Wound Measurements [name], [name] ([id number]) Length: (cm) 1 Width: (cm) 1 Depth: (cm) 0. Area: (cm) 0 Volume: (cm) 0 [phone].pdf Page 5 of 5 % Reduction in Area: 68.8% % Reduction in Volume: 68.8% 2 Epithelialization: Medium (34-66%) .785 Tunneling: No .157 Undermining: No Wound Description Classification: Grade 3 Wound Margin: Distinct, outline attached Exudate Amount: Medium Exudate Type: Serosanguineous Exudate Color: red, brown Foul Odor After Cleansing: No Slough/Fibrino No Wound Bed Granulation Amount: Large (67-100%) Exposed Structure Granulation Quality: Red Fascia Exposed: No Necrotic Amount: None Present (0%) Fat Layer (Subcutaneous Tissue) Exposed: Yes Tendon Exposed: No Muscle Exposed: No Joint Exposed: No Bone Exposed: No Periwound Skin Texture Texture Color No Abnormalities Noted: No No Abnormalities Noted: No Callus: Yes Atrophie Blanche: No Crepitus: No Cyanosis: No Excoriation: No Ecchymosis: No Induration: No Erythema: No Rash: No Hemosiderin Staining: No Scarring: No Mottled: No Pallor: No Moisture Rubor: No No Abnormalities Noted: No Dry / Scaly: Yes Maceration: No Electronic Signature(s) Signed: 10/23/2022 4:25:20 PM  By: [name] Entered By: [name] on 10/23/2022 10:47:20 -------------------------------------------------------------------------------- Vitals Details Patient Name: Date of Service: [name], [name] 10/23/2022 10:00 A M Medical Record Number: [id number] Patient Account Number: [id number] Date of Birth/Sex: Treating RN: [name][birth year] (57 y.o. F) Primary Care Shayleigh Bouldin: [name] Other Clinician: Referring Shakirah Kirkey: Treating Aaira Oestreicher/Extender: [name] in Treatment: 19 Vital Signs Time Taken: 10:35 Temperature (F): 97.6 Height (in): 65 Pulse (bpm): 88 Weight (lbs): 331 Respiratory Rate (breaths/min): 18 Body Mass Index (BMI): 55.1 Blood Pressure (mmHg): 124/84 Reference Range: 80 - 120 mg / dl Electronic Signature(s) Signed: 10/23/2022 4:25:20 PM By: [name] Entered By: [name] on 10/23/2022 10:36:03

## 2022-11-27 ENCOUNTER — Encounter (HOSPITAL_BASED_OUTPATIENT_CLINIC_OR_DEPARTMENT_OTHER): Payer: No Typology Code available for payment source | Admitting: Physician Assistant

## 2022-11-27 DIAGNOSIS — E11621 Type 2 diabetes mellitus with foot ulcer: Secondary | ICD-10-CM | POA: Diagnosis not present

## 2022-11-28 NOTE — Progress Notes (Signed)
Fike, [name] ([id number].pdf Page 1 of 7 Visit Report for 11/27/2022 Arrival Information Details Patient Name: Date of Service: [name], [name] 11/27/2022 10:30 A M Medical Record Number: [id number] Patient Account Number: [id number] Date of Birth/Sex: Treating RN: 12-09-1965 (57 y.o. F) Primary Care Nadeen Shipman: Stoney Bang Other Clinician: Referring Sarayah Bacchi: Treating Joeann Steppe/Extender: Dallas Breeding in Treatment: 24 Visit Information History Since Last Visit Added or deleted any medications: No Patient Arrived: Ambulatory Any new allergies or adverse reactions: No Arrival Time: 10:41 Had a fall or experienced change in No Accompanied By: son activities of daily living that may affect Transfer Assistance: None risk of falls: Patient Identification Verified: Yes Signs or symptoms of abuse/neglect since last visito No Secondary Verification Process Completed: Yes Hospitalized since last visit: No Patient Requires Transmission-Based Precautions: No Implantable device outside of the clinic excluding No Patient Has Alerts: Yes cellular tissue based products placed in the center Patient Alerts: PICC in left arm since last visit: Has Dressing in Place as Prescribed: Yes Pain Present Now: No Electronic Signature(s) Signed: 11/27/2022 4:31:17 PM By: Erenest Blank Entered By: Erenest Blank on 11/27/2022 10:43:27 -------------------------------------------------------------------------------- Encounter Discharge Information Details Patient Name: Date of Service: [name], [name] 11/27/2022 10:30 A M Medical Record Number: [id number] Patient Account Number: [id number] Date of Birth/Sex: Treating RN: 1966-08-14 (57 y.o. Debby Bud Primary Care Aerionna Moravek: Stoney Bang Other Clinician: Referring Shevette Bess: Treating Fradel Baldonado/Extender: Dallas Breeding in Treatment: 24 Encounter Discharge Information Items Post Procedure  Vitals Discharge Condition: Stable Temperature (F): 98.6 Ambulatory Status: Ambulatory Pulse (bpm): 93 Discharge Destination: Home Respiratory Rate (breaths/min): 18 Transportation: Private Auto Blood Pressure (mmHg): 123/78 Accompanied By: son Schedule Follow-up Appointment: Yes Clinical Summary of Care: Electronic Signature(s) Signed: 11/27/2022 5:20:51 PM By: Deon Pilling RN, BSN Entered By: Deon Pilling on 11/27/2022 11:14:50 Fendrick, Shemeka ([id number] Page 2 of 7 -------------------------------------------------------------------------------- Lower Extremity Assessment Details Patient Name: Date of Service: [name], [name] 11/27/2022 10:30 A M Medical Record Number: [id number] Patient Account Number: [id number] Date of Birth/Sex: Treating RN: [birth year] (57 y.o. F) Primary Care Corbet Hanley: Stoney Bang Other Clinician: Referring Djuana Littleton: Treating Nell Schrack/Extender: Matt Holmes Weeks in Treatment: 24 Edema Assessment Assessed: [Left: No] [Right: No] Edema: [Left: N] [Right: o] Calf Left: Right: Point of Measurement: 32 cm From Medial Instep 59 cm Ankle Left: Right: Point of Measurement: 10 cm From Medial Instep 31 cm Electronic Signature(s) Signed: 11/27/2022 4:31:17 PM By: Erenest Blank Entered By: Erenest Blank on 11/27/2022 10:50:34 -------------------------------------------------------------------------------- Multi-Disciplinary Care Plan Details Patient Name: Date of Service: [name], [name] 11/27/2022 10:30 A M Medical Record Number: [id number] Patient Account Number: [id number] Date of Birth/Sex: Treating RN: 12-Apr-1966 (57 y.o. Debby Bud Primary Care Kevontay Burks: Stoney Bang Other Clinician: Referring Ethyle Tiedt: Treating Maily Debarge/Extender: Dallas Breeding in Treatment: 24 Active Inactive Wound/Skin Impairment Nursing Diagnoses: Impaired tissue integrity Knowledge deficit related to  ulceration/compromised skin integrity Goals: Patient will have a decrease in wound volume by X% from date: (specify in notes) Date Initiated: 06/12/2022 Target Resolution Date: 12/06/2022 Goal Status: Active Patient/caregiver will verbalize understanding of skin care regimen Date Initiated: 06/12/2022 Target Resolution Date: 12/06/2022 Goal Status: Active Ulcer/skin breakdown will have a volume reduction of 30% by week 4 Date Initiated: 06/12/2022 Date Inactivated: 07/31/2022 Target Resolution Date: 08/03/2022 Goal Status: Unmet Unmet Reason: dx with osteomyelitis. Ulcer/skin breakdown will have a volume reduction of 50% by week 8 Date Initiated: 06/12/2022 Date Inactivated: 10/16/2022 Target Resolution Date: 10/05/2022 Unmet Reason: see  wound Goal Status: Unmet measurement. IV antibiotics Carley, Damica ([id number] Page 3 of 7 Interventions: Assess patient/caregiver ability to obtain necessary supplies Assess patient/caregiver ability to perform ulcer/skin care regimen upon admission and as needed Assess ulceration(s) every visit Notes: Electronic Signature(s) Signed: 11/27/2022 5:20:51 PM By: Deon Pilling RN, BSN Entered By: Deon Pilling on 11/27/2022 11:05:08 -------------------------------------------------------------------------------- Pain Assessment Details Patient Name: Date of Service: [name], [name] 11/27/2022 10:30 A M Medical Record Number: [id number] Patient Account Number: [id number] Date of Birth/Sex: Treating RN: March 15, 1966 (57 y.o. F) Primary Care [name] [name]: Stoney Bang Other Clinician: Referring Sherrian Nunnelley: Treating Delynda Sepulveda/Extender: Dallas Breeding in Treatment: 24 Active Problems Location of Pain Severity and Description of Pain Patient Has Paino No Site Locations Pain Management and Medication Current Pain Management: Notes a four when walking Electronic Signature(s) Signed: 11/27/2022 4:31:17 PM By: Erenest Blank Entered By: Erenest Blank on 11/27/2022 10:44:06 -------------------------------------------------------------------------------- Patient/Caregiver Education Details Patient Name: Date of Service: [name], [name] 2/21/2024andnbsp10:30 [place] Record Number: [id number] Patient Account Number: [id number] Darrow, [name] ([id number]) [id number] Page 4 of 7 Date of Birth/Gender: Treating RN: [birth year] (57 y.o. Debby Bud Primary Care Physician: Stoney Bang Other Clinician: Referring Physician: Treating Physician/Extender: Dallas Breeding in Treatment: 24 Education Assessment Education Provided To: Patient Education Topics Provided Wound/Skin Impairment: Handouts: Caring for Your Ulcer Methods: Explain/Verbal Responses: Reinforcements needed Electronic Signature(s) Signed: 11/27/2022 5:20:51 PM By: Deon Pilling RN, BSN Entered By: Deon Pilling on 11/27/2022 11:05:18 -------------------------------------------------------------------------------- Wound Assessment Details Patient Name: Date of Service: CHETANA, SCOPEL 11/27/2022 10:30 A M Medical Record Number: [id number] Patient Account Number: [id number] Date of Birth/Sex: Treating RN: 21-Oct-1965 (57 y.o. F) Primary Care Massimiliano Rohleder: Stoney Bang Other Clinician: Referring Ysenia Filice: Treating Kellianne Ek/Extender: Dallas Breeding in Treatment: 24 Wound Status Wound Number: 1 Primary Etiology: Diabetic Wound/Ulcer of the Lower Extremity Wound Location: Right Calcaneus Wound Status: Open Wounding Event: Gradually Appeared Comorbid History: Hypertension, Type II Diabetes, Osteomyelitis Date Acquired: 11/07/2020 Weeks Of Treatment: 24 Clustered Wound: Yes Photos Wound Measurements Length: (cm) 6 Width: (cm) 2 Depth: (cm) 0.2 Clustered Quantity: 2 Area: (cm) 9.42 Volume: (cm) 1.88 % Reduction in Area: 18.9% % Reduction in Volume:  18.9% Epithelialization: Large (67-100%) 5 5 Wound Description Classification: Grade 3 Wound Margin: Thickened Exudate Amount: Medium Mccanless, Teasha ([id number]) Exudate Type: Serosanguineous Exudate Color: red, brown Foul Odor After Cleansing: No Slough/Fibrino Yes [id number].pdf Page 5 of 7 Wound Bed Granulation Amount: Large (67-100%) Exposed Structure Granulation Quality: Red, Pink Fascia Exposed: No Necrotic Amount: None Present (0%) Fat Layer (Subcutaneous Tissue) Exposed: Yes Tendon Exposed: No Muscle Exposed: No Joint Exposed: No Bone Exposed: No Periwound Skin Texture Texture Color No Abnormalities Noted: No No Abnormalities Noted: No Callus: Yes Atrophie Blanche: No Crepitus: No Cyanosis: No Excoriation: No Ecchymosis: No Induration: No Erythema: No Rash: No Hemosiderin Staining: No Scarring: No Mottled: No Pallor: No Moisture Rubor: No No Abnormalities Noted: No Dry / Scaly: No Temperature / Pain Maceration: Yes Temperature: No Abnormality Tenderness on Palpation: Yes Treatment Notes Wound #1 (Calcaneus) Wound Laterality: Right Cleanser Soap and Water Discharge Instruction: May shower and wash wound with dial antibacterial soap and water prior to dressing change. Wound Cleanser Discharge Instruction: Cleanse the wound with wound cleanser prior to applying a clean dressing using gauze sponges, not tissue or cotton balls. Peri-Wound Care Sween Lotion (Moisturizing lotion) Discharge Instruction: Apply moisturizing lotion as directed Topical Primary Dressing Sorbalgon AG Dressing 2x2 (in/in) Discharge Instruction: Apply to wound  bed as instructed Secondary Dressing ABD Pad, 5x9 Discharge Instruction: Apply over primary dressing as directed. Woven Gauze Sponge, Non-Sterile 4x4 in Discharge Instruction: Apply over primary dressing as directed. heel ortho felt Discharge Instruction: apply in shoe to aid in offloading of heel  and wound. Secured With Conforming Stretch Gauze Bandage Roll, Sterile 4x75 (in/in) Discharge Instruction: Secure with stretch gauze as directed. 64M Medipore H Soft Cloth Surgical T ape, 4 x 10 (in/yd) Discharge Instruction: Secure with tape as directed. Compression Wrap Compression Stockings Add-Ons Electronic Signature(s) Signed: 11/27/2022 4:31:17 PM By: Erenest Blank Entered By: Erenest Blank on 11/27/2022 10:52:09 Jakes, Maudene ([id number] Page 6 of 7 -------------------------------------------------------------------------------- Wound Assessment Details Patient Name: Date of Service: DONELL, ALEEM 11/27/2022 10:30 A M Medical Record Number: [id number] Patient Account Number: [id number] Date of Birth/Sex: Treating RN: August 13, 1966 (57 y.o. Helene Shoe, Meta.Reding Primary Care Mariene Dickerman: Stoney Bang Other Clinician: Referring Mohab Ashby: Treating Matej Sappenfield/Extender: Dallas Breeding in Treatment: 24 Wound Status Wound Number: 4R Primary Etiology: Diabetic Wound/Ulcer of the Lower Extremity Wound Location: Right, Distal, Plantar Foot Wound Status: Open Wounding Event: Blister Comorbid History: Hypertension, Type II Diabetes, Osteomyelitis Date Acquired: 11/08/2020 Weeks Of Treatment: 6 Clustered Wound: Yes Photos Wound Measurements Length: (cm) Width: (cm) Depth: (cm) Clustered Quantity: Area: (cm) Volume: (cm) 0.8 % Reduction in Area: 65% 1.4 % Reduction in Volume: 82.5% 0.1 Epithelialization: Medium (34-66%) 2 Tunneling: No 0.88 Undermining: No 0.088 Wound Description Classification: Grade 3 Wound Margin: Distinct, outline attached Exudate Amount: Medium Exudate Type: Serosanguineous Exudate Color: red, brown Foul Odor After Cleansing: No Slough/Fibrino No Wound Bed Granulation Amount: Large (67-100%) Exposed Structure Granulation Quality: Red Fascia Exposed: No Necrotic Amount: None Present (0%) Fat Layer  (Subcutaneous Tissue) Exposed: Yes Tendon Exposed: No Muscle Exposed: No Joint Exposed: No Bone Exposed: No Periwound Skin Texture Texture Color No Abnormalities Noted: No No Abnormalities Noted: No Callus: Yes Atrophie Blanche: No Crepitus: No Cyanosis: No Excoriation: No Ecchymosis: No Induration: No Erythema: No Rash: No Hemosiderin Staining: No Scarring: No Mottled: No Pallor: No Moisture Rubor: No No Abnormalities Noted: No Dry / Scaly: No Maceration: No Brazier, Zaharah ([id number] Page 7 of 7 Treatment Notes Wound #4R (Foot) Wound Laterality: Plantar, Right, Distal Cleanser Peri-Wound Care Topical Primary Dressing Secondary Dressing Secured With Compression Wrap Compression Stockings Add-Ons Electronic Signature(s) Signed: 11/27/2022 5:20:51 PM By: Deon Pilling RN, BSN Entered By: Deon Pilling on 11/27/2022 11:13:47 -------------------------------------------------------------------------------- Vitals Details Patient Name: Date of Service: TABER, SANKEY 11/27/2022 10:30 A M Medical Record Number: [id number] Patient Account Number: [id number] Date of Birth/Sex: Treating RN: Jan 23, 1966 (57 y.o. F) Primary Care Trinadee Verhagen: Stoney Bang Other Clinician: Referring Sandrea Boer: Treating Jacquees Gongora/Extender: Dallas Breeding in Treatment: 24 Vital Signs Time Taken: 10:43 Temperature (F): 98.6 Height (in): 65 Pulse (bpm): 93 Weight (lbs): 331 Respiratory Rate (breaths/min): 18 Body Mass Index (BMI): 55.1 Blood Pressure (mmHg): 123/78 Reference Range: 80 - 120 mg / dl Electronic Signature(s) Signed: 11/27/2022 4:31:17 PM By: Erenest Blank Entered By: Erenest Blank on 11/27/2022 10:43:49

## 2022-11-28 NOTE — Progress Notes (Addendum)
[name], [name] ([id number].pdf Page 1 of 10 Visit Report for 11/27/2022 Chief Complaint Document Details Patient Name: Date of Service: [name] 11/27/2022 10:30 A M Medical Record Number: [id number] Patient Account Number: [id number] Date of Birth/Sex: Treating RN: Jan 24, 1966 (57 y.o. F) Primary Care Provider: Stoney Bang Other Clinician: Referring Provider: Treating Provider/Extender: [name] in Treatment: 24 Information Obtained from: Patient Chief Complaint Right heel ulcer Electronic Signature(s) Signed: 11/27/2022 10:36:30 AM By: Worthy Keeler PA-C Entered By: Worthy Keeler on 11/27/2022 10:36:30 -------------------------------------------------------------------------------- Debridement Details Patient Name: Date of Service: [name] 11/27/2022 10:30 A M Medical Record Number: [id number] Patient Account Number: [id number] Date of Birth/Sex: Treating RN: 1966-02-14 (57 y.o. [name], Meta.Reding Primary Care Provider: Stoney Bang Other Clinician: Referring Provider: Treating Provider/Extender: [name] in Treatment: 24 Debridement Performed for Assessment: Wound #4R Right,Distal,Plantar Foot Performed By: Physician Worthy Keeler, PA Debridement Type: Debridement Severity of Tissue Pre Debridement: Fat layer exposed Level of Consciousness (Pre-procedure): Awake and Alert Pre-procedure Verification/Time Out Yes - 11:00 Taken: Start Time: 11:01 Pain Control: Lidocaine 5% topical ointment T Area Debrided (L x W): otal 2 (cm) x 2 (cm) = 4 (cm) Tissue and other material debrided: Non-Viable, Callus, Skin: Dermis , Skin: Epidermis Level: Skin/Epidermis Debridement Description: Selective/Open Wound Instrument: Curette Bleeding: None End Time: 11:11 Procedural Pain: 0 Post Procedural Pain: 0 Response to Treatment: Procedure was tolerated well Level of Consciousness (Post-  Awake and Alert procedure): Post Debridement Measurements of Total Wound Length: (cm) 0.8 Width: (cm) 0.4 Depth: (cm) 0.1 Volume: (cm) 0.025 Character of Wound/Ulcer Post Debridement: Improved Severity of Tissue Post Debridement: Fat layer exposed [name], [name] ([id number].pdf Page 2 of 10 Post Procedure Diagnosis Same as Pre-procedure Electronic Signature(s) Signed: 11/27/2022 5:20:51 PM By: Deon Pilling RN, BSN Signed: 11/27/2022 5:24:38 PM By: Worthy Keeler PA-C Entered By: Deon Pilling on 11/27/2022 11:11:57 -------------------------------------------------------------------------------- Debridement Details Patient Name: Date of Service: [name] 11/27/2022 10:30 A M Medical Record Number: [id number] Patient Account Number: [id number] Date of Birth/Sex: Treating RN: May 29, 1966 (57 y.o. [name], Meta.Reding Primary Care Provider: Stoney Bang Other Clinician: Referring Provider: Treating Provider/Extender: [name] in Treatment: 24 Debridement Performed for Assessment: Wound #1 Right Calcaneus Performed By: Physician Worthy Keeler, PA Debridement Type: Debridement Severity of Tissue Pre Debridement: Fat layer exposed Level of Consciousness (Pre-procedure): Awake and Alert Pre-procedure Verification/Time Out Yes - 11:00 Taken: Start Time: 11:01 Pain Control: Lidocaine 5% topical ointment T Area Debrided (L x W): otal 4 (cm) x 2 (cm) = 8 (cm) Tissue and other material debrided: Non-Viable, Callus, Skin: Dermis , Skin: Epidermis Level: Skin/Epidermis Debridement Description: Selective/Open Wound Instrument: Curette Bleeding: None End Time: 11:11 Procedural Pain: 0 Post Procedural Pain: 0 Response to Treatment: Procedure was tolerated well Level of Consciousness (Post- Awake and Alert procedure): Post Debridement Measurements of Total Wound Length: (cm) 6 Width: (cm) 2 Depth: (cm) 0.1 Volume: (cm)  0.942 Character of Wound/Ulcer Post Debridement: Improved Severity of Tissue Post Debridement: Fat layer exposed Post Procedure Diagnosis Same as Pre-procedure Electronic Signature(s) Signed: 11/27/2022 5:20:51 PM By: Deon Pilling RN, BSN Signed: 11/27/2022 5:24:38 PM By: Worthy Keeler PA-C Entered By: Deon Pilling on 11/27/2022 11:12:26 -------------------------------------------------------------------------------- HPI Details Patient Name: Date of Service: [name] 11/27/2022 10:30 A M Medical Record Number: [id number] Patient Account Number: [id number] [name] ([id number]) [id number] Page 3 of 10 Date of Birth/Sex: Treating RN: 12-11-1965 (57 y.o. F) Primary  Care Provider: Other Clinician: Stoney Bang Referring Provider: Treating Provider/Extender: [name] in Treatment: 24 History of Present Illness HPI Description: 06-13-2022 upon evaluation today patient presents for evaluation of her right heel ulcer. She is having a tremendous amount of pain at this location. She tells me that her most recent hemoglobin A1c was 8.7 and that was on 08-23-2021. This ulcer on the heel has been present she states since February 2023 when she had an injection to the heel by podiatry. She states that she began to have increasing pain the wound opened and has been open ever since. 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 She had an x-ray in June if anything can although there did not appear to be any obvious evidence of osteomyelitis at that point. She subsequently has had OR debridement several times of the wound performed by podiatry. Patient does have a history of lymphedema of the lower extremities she is also a type II diabetic. 06-19-2022 upon evaluation today patient appears to be doing better in regard to the size of her leg which is significantly improved. With that being said she had a lot of drainage  from the heel which is completely understandable considering what is going on at this time. There does not appear to be any evidence of active infection there is no warmth or irritation to the leg in general. With that being said the patient does have an issue here with the amount of drainage that is going on I definitely think Zetuvit is good to be the better way to go in place of ABD pads. Also think that she could potentially benefit from 3 times per week dressing changes but again right now we are going to stick with the to change it today, change in her Friday, and then subsequently seeing where things stand next Wednesday. The other option would be to change her to a different day for wound care having her come then say like a Tuesday Friday or Monday Thursday. 06-26-2022 upon evaluation patient's wound bed actually showed signs of doing well in regard to the overall size it was measuring slightly smaller. With that being said unfortunately the biggest issue we see is she still has a tremendous amount of drainage which is what could keep this from being able to use a total contact cast. For that reason I am did going to discuss with her today the possibility of trying Tubigrip to see if that could be of benefit for her. She voiced understanding and is in agreement with giving this a try. 07-03-2022 upon evaluation today patient unfortunately appears to be doing significantly worse at this point in regard to her swelling due to the fact that she was unable to really wear the Tubigrip. She tells me that it was cutting into her. With that being said she also did have an MRI this MRI revealed that she does have osteomyelitis in the heel this was actually just performed Monday, 25 September. This does show signs of "early osteomyelitis". Nonetheless this is still concerning to me. The wound also appears to be getting deeper in the center aspect of this which has me a little concerned as well. I do  believe that she is likely going require an aggressive approach here to try to get things better. I discussed that in greater detail with her today. I will detailed in the plan. 07-17-2022 upon evaluation today patient appears to be doing poorly in regard  to her wound. Unfortunately she does not show any signs of infection systemically though locally this seems to be doing worse with the wound actually being bigger than where it was previous. She did have an MRI that showed evidence of osteomyelitis actually recommended a referral to ID she was evaluated infectious disease and they recommended referral to podiatry and to be honest have recommended amputation based on what the patient tells me today. With that being said this is something that she is not interested in at all to be perfectly honest. For that reason she wants to know what she should do and where she should go at this point that is where the majority of the conversation which was quite lengthy today went. Obviously understand her concern here but I think she is going to have to definitely get off this and likely this means she is going to need to come out of work. 10/18; Since the patient was the patient is using Hydrofera Blue on her right heel wound. She has a new larger open area superior to the wound. The skin on the bottom of her foot is completely macerated. I reviewed the infectious disease note on this patient from 10/9. They recommended keeping the patient on Delofloxacin 450 twice daily until follow-up tomorrow. The patient states she is not going back there as the only thing they wanted to do was "cut my leg off]. She has not seen podiatry. Her lab works shows a CRP markedly elevated at 240.2 a sedimentation rate of 96 the rest of her CBC is normal creatinine of 0.38 The patient has been to see her primary doctor who is Dr. Melonie [place] [[facility]] In [place]. According the patient Dr. Mylinda Latina has ordered vancomycin and Zosyn to start on  November 1. She is not taking the delofloxacin that was suggested by infectious disease. Very angry that they just wanted to consider her for an amputation although I do not specifically see that stated in their note 07-31-2022 upon evaluation today patient still has a significant wound over her plantar aspect of her right l foot region. With that being said I am definitely concerned about the fact that the patient probably does need to be in hyperbaric oxygen therapy at this point. I think we should try to get this going as quickly as possible. She actually is going to be set up with a PICC line next Wednesday and then following this we will have the vancomycin and Zosyn that she will be taking for 8 weeks according to what she tells me. I think this is definitely going to be beneficial and again the goal here is limb salvage. In combination with that I think the hyperbaric oxygen therapy would be ultimately significantly helpful for her. 08-07-2022 upon evaluation today patient appears to still be doing quite poorly overall she still has not gotten her antibiotics. She got her PICC line today which I assumed she will be getting the first dose of antibiotics as well during that time. With that being said unfortunately she did not get the antibiotics and in fact as I questioned her further she does not even know when or if that is going to be started this week. Again I am very concerned about this considering she already has a PICC line in place we do not want this to clot off on top of the fact that she should already be on these antibiotics she just never went to the ER for evaluation therefore they never got this  started. At this point based on what I am seeing I really think that the ideal thing would be for [place] to see about getting her to the ER for further evaluation and treatment to see if they can get this started for her as soon as possible. The patient voiced understanding and is in agreement  with the plan. I gave her recommendations for what should be done also called [facility] infusion therapy in order to see if they would be the ones that seem to be available for her IV infusions but they tell me that they no longer do this that is apparently where the orders were sent to for her infusion therapy. That was by her primary care provider. 08-21-2022 upon evaluation today patient's wound on the bottom of the heel and foot area as well as the toe appear to be doing significantly better. Fortunately I do not see any evidence of infection locally or systemically and everything is measuring much smaller than where we have been. The wound in the left gluteal region also is dramatically improved compared to last time she was here. She has started the antibiotics this is cefepime and vancomycin. She did have a somewhat high trough and therefore they have her hold that today and they are to recheck in the morning I believe she told me. Nonetheless with the antibiotics going she seems to be doing significantly better which is great news. 08-28-2022 upon evaluation today patient appears to be doing somewhat better in regard to her foot there is definitely less drainage than what we have seen in the past. I do believe the antibiotics are helping. With that being said she is also been approved for hyperbaric oxygen therapy and I think as soon as we get this started the better. I discussed that with her today as well. With that being said I do think that the last thing she needs is her chest x-ray when she has that done will be ready to start her into treatment. She is going to try to go get that today when she leaves which I think would be ideal. 09-11-2022 upon evaluation today patient appears to be doing well with regard to her foot ulcer. This is showing signs of no digression and she does not appear to be having as much drainage as before though it still very wet is not nearly the extreme of what it was  previous. Fortunately there does not appear to be any signs of active infection locally nor systemically at this point which is great news. Patient did have her chest x-ray as well as EKG and she is actually approved and completely cleared for HBO therapy from both a clinical standpoint as well as an insurance standpoint. 09-25-2022 upon evaluation today patient appears to be doing well in regard to her foot ulcer. Unfortunately she is not doing as well when it comes to her hyperbaric treatments. She is having a lot of trouble with claustrophobia. She tells me in general that she is not sure she is good to be able to continue. Azzara, [name] ([id number].pdf Page 4 of 10 10-09-2022 upon evaluation today patient's wounds actually are showing signs of excellent improvement which is great news. Fortunately I do not see any evidence of infection locally or systemically which is great news and overall I am extremely pleased with where things are currently. I do think that she is making good progress she does tell me at this point today she does not want to go  forward with a hyperbaric oxygen therapy. 10-16-2022 upon evaluation today patient appears to be doing excellent in regard to her foot ulcer. This is actually showing signs of excellent improvement. She still does not allow for any sharp debridement but the good news is this is doing much better and does not even need it at the moment. Nonetheless as far as sharp debridement is concerned this has been too painful for really not been able to do that all along. 10-23-2022 upon evaluation today patient actually is making excellent progress. I am extremely pleased with where we stand I have considered even seeing about putting her in the total contact cast but to be honest she is doing so well currently and still with having some drainage I feel like it is better for her to be able to change it then to be locked up in the cast  for a week at a time. She has voiced understanding as well. Overall though I feel like you are making some really good progress here. 10-30-2022 upon evaluation today patient appears to be doing excellent in regard to her foot ulcers. Both are showing signs of improvement which is great news and overall I feel like that they are measuring smaller, looking better, and I see no signs of resurgence of the infection which is great news. Overall I am extremely pleased at this point. 2/7; the patient has 2 wounds which are remanence of the large wound on the right foot. This was a diabetic wound with underlying osteomyelitis she has completed antibiotics. Today she has the open area on the distal calcaneus and an area on the right midfoot which is eschared. She has been using silver alginate and offloading this in a regular running [name]. She works as a Librarian, academic in a long-term care facility but otherwise seems to offload this as much as possible. 11-27-2022 upon evaluation today patient appears to be doing well currently in regard to her wound which is actually showing signs of significant improvement this is great news. Fortunately there does not appear to be any signs of active infection locally nor systemically which is also excellent. No fevers, chills, nausea, vomiting, or diarrhea. Electronic Signature(s) Signed: 11/27/2022 4:42:44 PM By: Worthy Keeler PA-C Entered By: Worthy Keeler on 11/27/2022 16:42:44 -------------------------------------------------------------------------------- Physical Exam Details Patient Name: Date of Service: LYNZEE, HOWLE 11/27/2022 10:30 A M Medical Record Number: [id number] Patient Account Number: [id number] Date of Birth/Sex: Treating RN: January 17, 1966 (57 y.o. F) Primary Care Provider: Stoney Bang Other Clinician: Referring Provider: Treating Provider/Extender: [name] in Treatment: 24 Constitutional Obese and well-hydrated in no  acute distress. Respiratory normal breathing without difficulty. Psychiatric this patient is able to make decisions and demonstrates good insight into disease process. Alert and Oriented x 3. pleasant and cooperative. Notes Patient's wound bed did require some sharp debridement at both locations 1 where she previously had the wound on the middle part of the foot and as well as the heel portion. I did perform debridement of both locations just clearing away the callus she tolerated that without complication minimal discomfort postdebridement this is significantly improved which is great news. I am very pleased and I think this is good to help her with healing. Electronic Signature(s) Signed: 11/27/2022 4:43:11 PM By: Worthy Keeler PA-C Entered By: Worthy Keeler on 11/27/2022 16:43:11 -------------------------------------------------------------------------------- Physician Orders Details Patient Name: Date of Service: ARISBEL, KEITA 11/27/2022 10:30 A M Medical Record Number: [id number] Patient Account Number: [id number] Date  of Birth/Sex: Treating RN: Nov 26, 1965 (57 y.o. Debby Bud [employer], [place] ([id number]) [id number] Page 5 of 10 Primary Care Provider: Stoney Bang Other Clinician: Referring Provider: Treating Provider/Extender: [name] in Treatment: 24 Verbal / Phone Orders: No Diagnosis Coding ICD-10 Coding Code Description [phone] Other chronic osteomyelitis, right ankle and foot E11.621 Type 2 diabetes mellitus with foot ulcer L97.412 Non-pressure chronic ulcer of right heel and midfoot with fat layer exposed I89.0 Lymphedema, not elsewhere classified Follow-up Appointments ppointment in 2 weeks. Margarita Grizzle Wednesday 1015 12/11/2022 (Dr. Dellia Nims covering) Room 8 Return A Anesthetic (In clinic) Topical Lidocaine 5% applied to wound bed Bathing/ Shower/ Hygiene May shower with protection but do not get wound dressing(s)  wet. Protect dressing(s) with water repellant cover (for example, large plastic bag) or a cast cover and may then take shower. Edema Control - Lymphedema / SCD / Other Elevate legs to the level of the heart or above for 30 minutes daily and/or when sitting for 3-4 times a day throughout the day. Avoid standing for long periods of time. Off-Loading Other: - Minimize walking and standing as much as possible to right foot to aid in offloading to wound. While at work use the wheelchair for mobility. felt in [name] to aid in offloading wound and heel. Wound Treatment Wound #1 - Calcaneus Wound Laterality: Right Cleanser: Soap and Water 1 x Per Day/30 Days Discharge Instructions: May shower and wash wound with dial antibacterial soap and water prior to dressing change. Cleanser: Wound Cleanser (Generic) 1 x Per Day/30 Days Discharge Instructions: Cleanse the wound with wound cleanser prior to applying a clean dressing using gauze sponges, not tissue or cotton balls. Peri-Wound Care: Sween Lotion (Moisturizing lotion) 1 x Per Day/30 Days Discharge Instructions: Apply moisturizing lotion as directed Prim Dressing: Sorbalgon AG Dressing 2x2 (in/in) (Generic) 1 x Per Day/30 Days ary Discharge Instructions: Apply to wound bed as instructed Secondary Dressing: ABD Pad, 5x9 (Generic) 1 x Per Day/30 Days Discharge Instructions: Apply over primary dressing as directed. Secondary Dressing: Woven Gauze Sponge, Non-Sterile 4x4 in (Generic) 1 x Per Day/30 Days Discharge Instructions: Apply over primary dressing as directed. Secondary Dressing: heel ortho felt 1 x Per Day/30 Days Discharge Instructions: apply in [name] to aid in offloading of heel and wound. Secured With: Scientist, forensic, Sterile 4x75 (in/in) (Generic) 1 x Per Day/30 Days Discharge Instructions: Secure with stretch gauze as directed. Secured With: 48M Medipore H Soft Cloth Surgical T ape, 4 x 10 (in/yd) (Generic) 1 x Per  Day/30 Days Discharge Instructions: Secure with tape as directed. Electronic Signature(s) Signed: 11/27/2022 5:20:51 PM By: Deon Pilling RN, BSN Signed: 11/27/2022 5:24:38 PM By: Worthy Keeler PA-C Entered By: Deon Pilling on 11/27/2022 11:13:31 Mortell, [name] ([id number] Page 6 of 10 -------------------------------------------------------------------------------- Problem List Details Patient Name: Date of Service: HEDDIE, ALCAUTER 11/27/2022 10:30 A M Medical Record Number: [id number] Patient Account Number: [id number] Date of Birth/Sex: Treating RN: [birth year] (57 y.o. F) Primary Care Provider: Stoney Bang Other Clinician: Referring Provider: Treating Provider/Extender: [name] in Treatment: 24 Active Problems ICD-10 Encounter Code Description Active Date MDM Diagnosis [phone] Other chronic osteomyelitis, right ankle and foot 08/28/2022 No Yes E11.621 Type 2 diabetes mellitus with foot ulcer 06/12/2022 No Yes L97.412 Non-pressure chronic ulcer of right heel and midfoot with fat layer exposed 06/12/2022 No Yes I89.0 Lymphedema, not elsewhere classified 06/12/2022 No Yes Inactive Problems Resolved Problems Electronic Signature(s) Signed: 11/27/2022 10:36:15 AM By:  Melburn Hake, Kyri Shader PA-C Entered By: Worthy Keeler on 11/27/2022 10:36:15 -------------------------------------------------------------------------------- Progress Note Details Patient Name: Date of Service: SINCERE, CASTLEBERRY 11/27/2022 10:30 A M Medical Record Number: [id number] Patient Account Number: [id number] Date of Birth/Sex: Treating RN: Sep 21, 1966 (57 y.o. F) Primary Care Provider: Stoney Bang Other Clinician: Referring Provider: Treating Provider/Extender: [name] in Treatment: 24 Subjective Chief Complaint Information obtained from Patient Right heel ulcer History of Present Illness (HPI) 06-13-2022 upon evaluation today  patient presents for evaluation of her right heel ulcer. She is having a tremendous amount of pain at this location. She tells me that her most recent hemoglobin A1c was 8.7 and that was on 08-23-2021. This ulcer on the heel has been present she states since February 2023 when she had an injection to the heel by podiatry. She states that she began to have increasing pain the wound opened and has been open ever since. 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 She had an x-ray in June if anything can although there did not appear to be any obvious evidence of osteomyelitis at that point. She subsequently has had OR debridement several times of the wound performed by podiatry. Mielke, [name] ([id number] Page 7 of 10 Patient does have a history of lymphedema of the lower extremities she is also a type II diabetic. 06-19-2022 upon evaluation today patient appears to be doing better in regard to the size of her leg which is significantly improved. With that being said she had a lot of drainage from the heel which is completely understandable considering what is going on at this time. There does not appear to be any evidence of active infection there is no warmth or irritation to the leg in general. With that being said the patient does have an issue here with the amount of drainage that is going on I definitely think Zetuvit is good to be the better way to go in place of ABD pads. Also think that she could potentially benefit from 3 times per week dressing changes but again right now we are going to stick with the to change it today, change in her Friday, and then subsequently seeing where things stand next Wednesday. The other option would be to change her to a different day for wound care having her come then say like a Tuesday Friday or Monday Thursday. 06-26-2022 upon evaluation patient's wound bed actually showed signs of doing well in  regard to the overall size it was measuring slightly smaller. With that being said unfortunately the biggest issue we see is she still has a tremendous amount of drainage which is what could keep this from being able to use a total contact cast. For that reason I am did going to discuss with her today the possibility of trying Tubigrip to see if that could be of benefit for her. She voiced understanding and is in agreement with giving this a try. 07-03-2022 upon evaluation today patient unfortunately appears to be doing significantly worse at this point in regard to her swelling due to the fact that she was unable to really wear the Tubigrip. She tells me that it was cutting into her. With that being said she also did have an MRI this MRI revealed that she does have osteomyelitis in the heel this was actually just performed Monday, 25 September. This does show signs of "early osteomyelitis". Nonetheless this is still concerning to me. The wound also appears to be  getting deeper in the center aspect of this which has me a little concerned as well. I do believe that she is likely going require an aggressive approach here to try to get things better. I discussed that in greater detail with her today. I will detailed in the plan. 07-17-2022 upon evaluation today patient appears to be doing poorly in regard to her wound. Unfortunately she does not show any signs of infection systemically though locally this seems to be doing worse with the wound actually being bigger than where it was previous. She did have an MRI that showed evidence of osteomyelitis actually recommended a referral to ID she was evaluated infectious disease and they recommended referral to podiatry and to be honest have recommended amputation based on what the patient tells me today. With that being said this is something that she is not interested in at all to be perfectly honest. For that reason she wants to know what she should do and  where she should go at this point that is where the majority of the conversation which was quite lengthy today went. Obviously understand her concern here but I think she is going to have to definitely get off this and likely this means she is going to need to come out of work. 10/18; Since the patient was the patient is using Hydrofera Blue on her right heel wound. She has a new larger open area superior to the wound. The skin on the bottom of her foot is completely macerated. I reviewed the infectious disease note on this patient from 10/9. They recommended keeping the patient on Delofloxacin 450 twice daily until follow-up tomorrow. The patient states she is not going back there as the only thing they wanted to do was "cut my leg off]. She has not seen podiatry. Her lab works shows a CRP markedly elevated at 240.2 a sedimentation rate of 96 the rest of her CBC is normal creatinine of 0.38 The patient has been to see her primary doctor who is Dr. Melonie [place] [SPo] In [place]. According the patient Dr. Mylinda Latina has ordered vancomycin and Zosyn to start on November 1. She is not taking the delofloxacin that was suggested by infectious disease. Very angry that they just wanted to consider her for an amputation although I do not specifically see that stated in their note 07-31-2022 upon evaluation today patient still has a significant wound over her plantar aspect of her right l foot region. With that being said I am definitely concerned about the fact that the patient probably does need to be in hyperbaric oxygen therapy at this point. I think we should try to get this going as quickly as possible. She actually is going to be set up with a PICC line next Wednesday and then following this we will have the vancomycin and Zosyn that she will be taking for 8 weeks according to what she tells me. I think this is definitely going to be beneficial and again the goal here is limb salvage. In combination with that I  think the hyperbaric oxygen therapy would be ultimately significantly helpful for her. 08-07-2022 upon evaluation today patient appears to still be doing quite poorly overall she still has not gotten her antibiotics. She got her PICC line today which I assumed she will be getting the first dose of antibiotics as well during that time. With that being said unfortunately she did not get the antibiotics and in fact as I questioned her further she does not even  know when or if that is going to be started this week. Again I am very concerned about this considering she already has a PICC line in place we do not want this to clot off on top of the fact that she should already be on these antibiotics she just never went to the ER for evaluation therefore they never got this started. At this point based on what I am seeing I really think that the ideal thing would be for [place] to see about getting her to the ER for further evaluation and treatment to see if they can get this started for her as soon as possible. The patient voiced understanding and is in agreement with the plan. I gave her recommendations for what should be done also called [facility] infusion therapy in order to see if they would be the ones that seem to be available for her IV infusions but they tell me that they no longer do this that is apparently where the orders were sent to for her infusion therapy. That was by her primary care provider. 08-21-2022 upon evaluation today patient's wound on the bottom of the heel and foot area as well as the toe appear to be doing significantly better. Fortunately I do not see any evidence of infection locally or systemically and everything is measuring much smaller than where we have been. The wound in the left gluteal region also is dramatically improved compared to last time she was here. She has started the antibiotics this is cefepime and vancomycin. She did have a somewhat high trough and therefore they have  her hold that today and they are to recheck in the morning I believe she told me. Nonetheless with the antibiotics going she seems to be doing significantly better which is great news. 08-28-2022 upon evaluation today patient appears to be doing somewhat better in regard to her foot there is definitely less drainage than what we have seen in the past. I do believe the antibiotics are helping. With that being said she is also been approved for hyperbaric oxygen therapy and I think as soon as we get this started the better. I discussed that with her today as well. With that being said I do think that the last thing she needs is her chest x-ray when she has that done will be ready to start her into treatment. She is going to try to go get that today when she leaves which I think would be ideal. 09-11-2022 upon evaluation today patient appears to be doing well with regard to her foot ulcer. This is showing signs of no digression and she does not appear to be having as much drainage as before though it still very wet is not nearly the extreme of what it was previous. Fortunately there does not appear to be any signs of active infection locally nor systemically at this point which is great news. Patient did have her chest x-ray as well as EKG and she is actually approved and completely cleared for HBO therapy from both a clinical standpoint as well as an insurance standpoint. 09-25-2022 upon evaluation today patient appears to be doing well in regard to her foot ulcer. Unfortunately she is not doing as well when it comes to her hyperbaric treatments. She is having a lot of trouble with claustrophobia. She tells me in general that she is not sure she is good to be able to continue. 10-09-2022 upon evaluation today patient's wounds actually are showing signs of excellent improvement  which is great news. Fortunately I do not see any evidence of infection locally or systemically which is great news and overall I am  extremely pleased with where things are currently. I do think that she is making good progress she does tell me at this point today she does not want to go forward with a hyperbaric oxygen therapy. 10-16-2022 upon evaluation today patient appears to be doing excellent in regard to her foot ulcer. This is actually showing signs of excellent improvement. She still does not allow for any sharp debridement but the good news is this is doing much better and does not even need it at the moment. Nonetheless as far as sharp debridement is concerned this has been too painful for really not been able to do that all along. 10-23-2022 upon evaluation today patient actually is making excellent progress. I am extremely pleased with where we stand I have considered even seeing about putting her in the total contact cast but to be honest she is doing so well currently and still with having some drainage I feel like it is better for her to be able to change it then to be locked up in the cast for a week at a time. She has voiced understanding as well. Overall though I feel like you are making some really good progress here. 10-30-2022 upon evaluation today patient appears to be doing excellent in regard to her foot ulcers. Both are showing signs of improvement which is great news Mealey, Surena ([id number]) [phone].pdf Page 8 of 10 and overall I feel like that they are measuring smaller, looking better, and I see no signs of resurgence of the infection which is great news. Overall I am extremely pleased at this point. 2/7; the patient has 2 wounds which are remanence of the large wound on the right foot. This was a diabetic wound with underlying osteomyelitis she has completed antibiotics. Today she has the open area on the distal calcaneus and an area on the right midfoot which is eschared. She has been using silver alginate and offloading this in a regular running [name]. She works as a  Librarian, academic in a long-term care facility but otherwise seems to offload this as much as possible. 11-27-2022 upon evaluation today patient appears to be doing well currently in regard to her wound which is actually showing signs of significant improvement this is great news. Fortunately there does not appear to be any signs of active infection locally nor systemically which is also excellent. No fevers, chills, nausea, vomiting, or diarrhea. Objective Constitutional Obese and well-hydrated in no acute distress. Vitals Time Taken: 10:43 AM, Height: 65 in, Weight: 331 lbs, BMI: 55.1, Temperature: 98.6 F, Pulse: 93 bpm, Respiratory Rate: 18 breaths/min, Blood Pressure: 123/78 mmHg. Respiratory normal breathing without difficulty. Psychiatric this patient is able to make decisions and demonstrates good insight into disease process. Alert and Oriented x 3. pleasant and cooperative. General Notes: Patient's wound bed did require some sharp debridement at both locations 1 where she previously had the wound on the middle part of the foot and as well as the heel portion. I did perform debridement of both locations just clearing away the callus she tolerated that without complication minimal discomfort postdebridement this is significantly improved which is great news. I am very pleased and I think this is good to help her with healing. Integumentary (Hair, Skin) Wound #1 status is Open. Original cause of wound was Gradually Appeared. The date acquired was: 11/07/2020. The wound has  been in treatment 24 weeks. The wound is located on the Right Calcaneus. The wound measures 6cm length x 2cm width x 0.2cm depth; 9.425cm^2 area and 1.885cm^3 volume. There is Fat Layer (Subcutaneous Tissue) exposed. There is a medium amount of serosanguineous drainage noted. The wound margin is thickened. There is large (67-100%) red, pink granulation within the wound bed. There is no necrotic tissue within the wound bed. The  periwound skin appearance exhibited: Callus, Maceration. The periwound skin appearance did not exhibit: Crepitus, Excoriation, Induration, Rash, Scarring, Dry/Scaly, Atrophie Blanche, Cyanosis, Ecchymosis, Hemosiderin Staining, Mottled, Pallor, Rubor, Erythema. Periwound temperature was noted as No Abnormality. The periwound has tenderness on palpation. Wound #4R status is Open. Original cause of wound was Blister. The date acquired was: 11/08/2020. The wound has been in treatment 6 weeks. The wound is located on the [place]. The wound measures 0.8cm length x 1.4cm width x 0.1cm depth; 0.88cm^2 area and 0.088cm^3 volume. There is Fat Layer (Subcutaneous Tissue) exposed. There is no tunneling or undermining noted. There is a medium amount of serosanguineous drainage noted. The wound margin is distinct with the outline attached to the wound base. There is large (67-100%) red granulation within the wound bed. There is no necrotic tissue within the wound bed. The periwound skin appearance exhibited: Callus. The periwound skin appearance did not exhibit: Crepitus, Excoriation, Induration, Rash, Scarring, Dry/Scaly, Maceration, Atrophie Blanche, Cyanosis, Ecchymosis, Hemosiderin Staining, Mottled, Pallor, Rubor, Erythema. Assessment Active Problems ICD-10 Other chronic osteomyelitis, right ankle and foot Type 2 diabetes mellitus with foot ulcer Non-pressure chronic ulcer of right heel and midfoot with fat layer exposed Lymphedema, not elsewhere classified Procedures Wound #1 Pre-procedure diagnosis of Wound #1 is a Diabetic Wound/Ulcer of the Lower Extremity located on the Right Calcaneus .Severity of Tissue Pre Debridement is: Fat layer exposed. There was a Selective/Open Wound Skin/Epidermis Debridement with a total area of 8 sq cm performed by Worthy Keeler, PA. With the following instrument(s): Curette to remove Non-Viable tissue/material. Material removed includes Callus, Skin:  Dermis, and Skin: Epidermis after achieving pain control using Lidocaine 5% topical ointment. A time out was conducted at 11:00, prior to the start of the procedure. There was no bleeding. The procedure was tolerated well with a pain level of 0 throughout and a pain level of 0 following the procedure. Post Debridement Measurements: 6cm length x 2cm width x 0.1cm depth; 0.942cm^3 volume. Character of Wound/Ulcer Post Debridement is improved. Severity of Tissue Post Debridement is: Fat layer exposed. Post procedure Diagnosis Wound #1: Same as Pre-Procedure Wound #4R Pre-procedure diagnosis of Wound #4R is a Diabetic Wound/Ulcer of the Lower Extremity located on the Right,Distal,Plantar Foot .Severity of Tissue Pre Decesare, Karianne ([id number].pdf Page 9 of 10 Debridement is: Fat layer exposed. There was a Selective/Open Wound Skin/Epidermis Debridement with a total area of 4 sq cm performed by Worthy Keeler, PA. With the following instrument(s): Curette to remove Non-Viable tissue/material. Material removed includes Callus, Skin: Dermis, and Skin: Epidermis after achieving pain control using Lidocaine 5% topical ointment. A time out was conducted at 11:00, prior to the start of the procedure. There was no bleeding. The procedure was tolerated well with a pain level of 0 throughout and a pain level of 0 following the procedure. Post Debridement Measurements: 0.8cm length x 0.4cm width x 0.1cm depth; 0.025cm^3 volume. Character of Wound/Ulcer Post Debridement is improved. Severity of Tissue Post Debridement is: Fat layer exposed. Post procedure Diagnosis Wound #4R: Same as Pre-Procedure Plan Follow-up Appointments:  Return Appointment in 2 weeks. Margarita Grizzle Wednesday 1015 12/11/2022 (Dr. Dellia Nims covering) Room 8 Anesthetic: (In clinic) Topical Lidocaine 5% applied to wound bed Bathing/ Shower/ Hygiene: May shower with protection but do not get wound dressing(s) wet.  Protect dressing(s) with water repellant cover (for example, large plastic bag) or a cast cover and may then take shower. Edema Control - Lymphedema / SCD / Other: Elevate legs to the level of the heart or above for 30 minutes daily and/or when sitting for 3-4 times a day throughout the day. Avoid standing for long periods of time. Off-Loading: Other: - Minimize walking and standing as much as possible to right foot to aid in offloading to wound. While at work use the wheelchair for mobility. felt in [name] to aid in offloading wound and heel. WOUND #1: - Calcaneus Wound Laterality: Right Cleanser: Soap and Water 1 x Per Day/30 Days Discharge Instructions: May shower and wash wound with dial antibacterial soap and water prior to dressing change. Cleanser: Wound Cleanser (Generic) 1 x Per Day/30 Days Discharge Instructions: Cleanse the wound with wound cleanser prior to applying a clean dressing using gauze sponges, not tissue or cotton balls. Peri-Wound Care: Sween Lotion (Moisturizing lotion) 1 x Per Day/30 Days Discharge Instructions: Apply moisturizing lotion as directed Prim Dressing: Sorbalgon AG Dressing 2x2 (in/in) (Generic) 1 x Per Day/30 Days ary Discharge Instructions: Apply to wound bed as instructed Secondary Dressing: ABD Pad, 5x9 (Generic) 1 x Per Day/30 Days Discharge Instructions: Apply over primary dressing as directed. Secondary Dressing: Woven Gauze Sponge, Non-Sterile 4x4 in (Generic) 1 x Per Day/30 Days Discharge Instructions: Apply over primary dressing as directed. Secondary Dressing: heel ortho felt 1 x Per Day/30 Days Discharge Instructions: apply in [name] to aid in offloading of heel and wound. Secured With: Scientist, forensic, Sterile 4x75 (in/in) (Generic) 1 x Per Day/30 Days Discharge Instructions: Secure with stretch gauze as directed. Secured With: 13M Medipore H Soft Cloth Surgical T ape, 4 x 10 (in/yd) (Generic) 1 x Per Day/30 Days Discharge  Instructions: Secure with tape as directed. 1. I am going to suggest that we have the patient continue to monitor for any signs of infection or worsening. Based on what I am seeing I do believe that she is making really good progress here. 2. Also can recommend that she should continue with the silver alginate dressing which I think is doing a really good job. 3. I am also can recommend ABD pad and gauze to secure in place. We will see patient back for reevaluation in 1 week here in the clinic. If anything worsens or changes patient will contact our office for additional recommendations. She is still continuing with appropriate offloading and seems to be doing a very good job in this regard. Electronic Signature(s) Signed: 11/27/2022 4:43:52 PM By: Worthy Keeler PA-C Entered By: Worthy Keeler on 11/27/2022 16:43:52 -------------------------------------------------------------------------------- SuperBill Details Patient Name: Date of Service: SAMIHA, EASTRIDGE 11/27/2022 Medical Record Number: [id number] Patient Account Number: [id number] Date of Birth/Sex: Treating RN: [birth year] (57 y.o. Debby Bud Primary Care Provider: Stoney Bang Other Clinician: Referring Provider: Treating Provider/Extender: [name] in Treatment: 24 Diagnosis Coding ICD-10 Codes Carr, [name] ([id number] Page 10 of 10 Code Description [phone] Other chronic osteomyelitis, right ankle and foot E11.621 Type 2 diabetes mellitus with foot ulcer L97.412 Non-pressure chronic ulcer of right heel and midfoot with fat layer exposed I89.0 Lymphedema, not elsewhere classified Facility Procedures : CPT4 Code:  [id number] Description: [phone] - DEBRIDE WOUND 1ST 20 SQ CM OR < ICD-10 Diagnosis Description L97.412 Non-pressure chronic ulcer of right heel and midfoot with fat layer exposed Modifier: Quantity: 1 Physician Procedures : CPT4 Code Description  Modifier [id number] - WC PHYS DEBR WO ANESTH 20 SQ CM ICD-10 Diagnosis Description [id number] Non-pressure chronic ulcer of right heel and midfoot with fat layer exposed Quantity: 1 Electronic Signature(s) Signed: 11/27/2022 4:44:13 PM By: Worthy Keeler PA-C Entered By: Worthy Keeler on 11/27/2022 16:44:12

## 2022-12-06 ENCOUNTER — Encounter: Payer: Self-pay | Admitting: *Deleted

## 2022-12-11 ENCOUNTER — Encounter (HOSPITAL_BASED_OUTPATIENT_CLINIC_OR_DEPARTMENT_OTHER): Payer: No Typology Code available for payment source | Admitting: Internal Medicine

## 2022-12-25 ENCOUNTER — Encounter (HOSPITAL_BASED_OUTPATIENT_CLINIC_OR_DEPARTMENT_OTHER): Payer: No Typology Code available for payment source | Attending: Physician Assistant | Admitting: Physician Assistant

## 2022-12-25 DIAGNOSIS — Z89429 Acquired absence of other toe(s), unspecified side: Secondary | ICD-10-CM | POA: Diagnosis not present

## 2022-12-25 DIAGNOSIS — I890 Lymphedema, not elsewhere classified: Secondary | ICD-10-CM | POA: Insufficient documentation

## 2022-12-25 DIAGNOSIS — M86671 Other chronic osteomyelitis, right ankle and foot: Secondary | ICD-10-CM | POA: Diagnosis not present

## 2022-12-25 DIAGNOSIS — L97412 Non-pressure chronic ulcer of right heel and midfoot with fat layer exposed: Secondary | ICD-10-CM | POA: Diagnosis not present

## 2022-12-25 DIAGNOSIS — E11621 Type 2 diabetes mellitus with foot ulcer: Secondary | ICD-10-CM | POA: Diagnosis not present

## 2022-12-25 DIAGNOSIS — I10 Essential (primary) hypertension: Secondary | ICD-10-CM | POA: Insufficient documentation

## 2022-12-26 NOTE — Progress Notes (Addendum)
Avalos, [name] ([id number]) [id number] Page 1 of 11 Visit Report for 12/25/2022 Chief Complaint Document Details Patient Name: Date of Service: [name] 12/25/2022 9:30 A M Medical Record Number: [id number] Patient Account Number: [id number] Date of Birth/Sex: Treating RN: 19-Oct-1965 (57 y.o. F) Primary Care Provider: Stoney Bang Other Clinician: Referring Provider: Treating Provider/Extender: Dallas Breeding in Treatment: 28 Information Obtained from: Patient Chief Complaint Right heel ulcer Electronic Signature(s) Signed: 12/25/2022 9:51:12 AM By: Worthy Keeler PA-C Entered By: Worthy Keeler on 12/25/2022 09:51:12 -------------------------------------------------------------------------------- Debridement Details Patient Name: Date of Service: [name] 12/25/2022 9:30 A M Medical Record Number: [id number] Patient Account Number: [id number] Date of Birth/Sex: Treating RN: 1966/05/14 (57 y.o. [name], [name] Primary Care Provider: Stoney Bang Other Clinician: Referring Provider: Treating Provider/Extender: Dallas Breeding in Treatment: 28 Debridement Performed for Assessment: Wound #4R Right,Distal,Plantar Foot Performed By: Physician Worthy Keeler, PA Debridement Type: Debridement Severity of Tissue Pre Debridement: Fat layer exposed Level of Consciousness (Pre-procedure): Awake and Alert Pre-procedure Verification/Time Out Yes - 10:15 Taken: Start Time: 10:15 Pain Control: Lidocaine T Area Debrided (L x W): otal 5 (cm) x 5 (cm) = 25 (cm) Tissue and other material debrided: Viable, Non-Viable, Callus, Skin: Dermis , Skin: Epidermis Level: Skin/Epidermis Debridement Description: Selective/Open Wound Instrument: Curette Bleeding: Minimum Hemostasis Achieved: Pressure End Time: 10:15 Procedural Pain: 0 Post Procedural Pain: 0 Response to Treatment: Procedure was tolerated well Level of  Consciousness (Post- Awake and Alert procedure): Post Debridement Measurements of Total Wound Length: (cm) 0.1 Width: (cm) 0.1 Depth: (cm) 0.1 Volume: (cm) 0.001 Character of Wound/Ulcer Post Debridement: Improved Severity of Tissue Post Debridement: Fat layer exposed Post Procedure Diagnosis Same as Pre-procedure Electronic Signature(s) Signed: 12/25/2022 6:05:31 PM By: Worthy Keeler PA-C Signed: 12/27/2022 11:12:19 AM By: Rhae Hammock RN Entered By: Rhae Hammock on 12/25/2022 10:16:42 Riess, Loyd ([id number]) [id number] Page 2 of 11 -------------------------------------------------------------------------------- Debridement Details Patient Name: Date of Service: [name] 12/25/2022 9:30 A M Medical Record Number: [id number] Patient Account Number: [id number] Date of Birth/Sex: Treating RN: 10-Jan-1966 (57 y.o. [name], [name] Primary Care Provider: Stoney Bang Other Clinician: Referring Provider: Treating Provider/Extender: Dallas Breeding in Treatment: 28 Debridement Performed for Assessment: Wound #1 Right Calcaneus Performed By: Physician Worthy Keeler, PA Debridement Type: Debridement Severity of Tissue Pre Debridement: Fat layer exposed Level of Consciousness (Pre-procedure): Awake and Alert Pre-procedure Verification/Time Out Yes - 10:15 Taken: Start Time: 10:15 Pain Control: Lidocaine T Area Debrided (L x W): otal 0.7 (cm) x 1.7 (cm) = 1.19 (cm) Tissue and other material debrided: Viable, Non-Viable, Callus, Slough, Subcutaneous, Skin: Dermis , Skin: Epidermis, Slough Level: Skin/Subcutaneous Tissue Debridement Description: Excisional Instrument: Curette Bleeding: Minimum Hemostasis Achieved: Pressure End Time: 10:15 Procedural Pain: 0 Post Procedural Pain: 0 Response to Treatment: Procedure was tolerated well Level of Consciousness (Post- Awake and Alert procedure): Post Debridement  Measurements of Total Wound Length: (cm) 0.7 Width: (cm) 1.7 Depth: (cm) 0.4 Volume: (cm) 0.374 Character of Wound/Ulcer Post Debridement: Improved Severity of Tissue Post Debridement: Fat layer exposed Post Procedure Diagnosis Same as Pre-procedure Electronic Signature(s) Signed: 12/25/2022 6:05:31 PM By: Worthy Keeler PA-C Signed: 12/27/2022 11:12:19 AM By: Rhae Hammock RN Entered By: Rhae Hammock on 12/25/2022 10:21:52 -------------------------------------------------------------------------------- HPI Details Patient Name: Date of Service: [name] 12/25/2022 9:30 A M Medical Record Number: [id number] Patient Account Number: [id number] Date of Birth/Sex: Treating RN: Apr 18, 1966 (57 y.o. F) Primary Care Provider: Stoney Bang Other  Clinician: Referring Provider: Treating Provider/Extender: Dallas Breeding in Treatment: 28 History of Present Illness HPI Description: 06-13-2022 upon evaluation today patient presents for evaluation of her right heel ulcer. She is having a tremendous amount of pain at this location. She tells me that her most recent hemoglobin A1c was 8.7 and that was on 08-23-2021. This ulcer on the heel has been present she states since February 2023 when she had an injection to the heel by podiatry. She states that she began to have increasing pain the wound opened and has been open ever since. 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 She had an x-ray in June if anything can although there did not appear to be any obvious evidence of osteomyelitis at that point. She subsequently has had OR debridement several times of the wound performed by podiatry. Patient does have a history of lymphedema of the lower extremities she is also a type II diabetic. 06-19-2022 upon evaluation today patient appears to be doing better in regard to the size of her leg which is significantly improved. With that being said she  had a lot of drainage from the heel which is completely understandable considering what is going on at this time. There does not appear to be any evidence of [name] ([id number]) [id number] Page 3 of 11 active infection there is no warmth or irritation to the leg in general. With that being said the patient does have an issue here with the amount of drainage that is going on I definitely think Zetuvit is good to be the better way to go in place of ABD pads. Also think that she could potentially benefit from 3 times per week dressing changes but again right now we are going to stick with the to change it today, change in her Friday, and then subsequently seeing where things stand next Wednesday. The other option would be to change her to a different day for wound care having her come then say like a Tuesday Friday or Monday Thursday. 06-26-2022 upon evaluation patient's wound bed actually showed signs of doing well in regard to the overall size it was measuring slightly smaller. With that being said unfortunately the biggest issue we see is she still has a tremendous amount of drainage which is what could keep this from being able to use a total contact cast. For that reason I am did going to discuss with her today the possibility of trying Tubigrip to see if that could be of benefit for her. She voiced understanding and is in agreement with giving this a try. 07-03-2022 upon evaluation today patient unfortunately appears to be doing significantly worse at this point in regard to her swelling due to the fact that she was unable to really wear the Tubigrip. She tells me that it was cutting into her. With that being said she also did have an MRI this MRI revealed that she does have osteomyelitis in the heel this was actually just performed Monday, 25 September. This does show signs of "early osteomyelitis". Nonetheless this is still concerning to me. The wound also appears  to be getting deeper in the center aspect of this which has me a little concerned as well. I do believe that she is likely going require an aggressive approach here to try to get things better. I discussed that in greater detail with her today. I will detailed in the plan. 07-17-2022 upon evaluation today patient appears to be doing  poorly in regard to her wound. Unfortunately she does not show any signs of infection systemically though locally this seems to be doing worse with the wound actually being bigger than where it was previous. She did have an MRI that showed evidence of osteomyelitis actually recommended a referral to ID she was evaluated infectious disease and they recommended referral to podiatry and to be honest have recommended amputation based on what the patient tells me today. With that being said this is something that she is not interested in at all to be perfectly honest. For that reason she wants to know what she should do and where she should go at this point that is where the majority of the conversation which was quite lengthy today went. Obviously understand her concern here but I think she is going to have to definitely get off this and likely this means she is going to need to come out of work. 10/18; Since the patient was the patient is using Hydrofera Blue on her right heel wound. She has a new larger open area superior to the wound. The skin on the bottom of her foot is completely macerated. I reviewed the infectious disease note on this patient from 10/9. They recommended keeping the patient on Delofloxacin 450 twice daily until follow-up tomorrow. The patient states she is not going back there as the only thing they wanted to do was "cut my leg off]. She has not seen podiatry. Her lab works shows a CRP markedly elevated at 240.2 a sedimentation rate of 96 the rest of her CBC is normal creatinine of 0.38 The patient has been to see her primary doctor who is Dr. Melonie [place]  [SPo] In [place]. According the patient Dr. Mylinda Latina has ordered vancomycin and Zosyn to start on November 1. She is not taking the delofloxacin that was suggested by infectious disease. Very angry that they just wanted to consider her for an amputation although I do not specifically see that stated in their note 07-31-2022 upon evaluation today patient still has a significant wound over her plantar aspect of her right l foot region. With that being said I am definitely concerned about the fact that the patient probably does need to be in hyperbaric oxygen therapy at this point. I think we should try to get this going as quickly as possible. She actually is going to be set up with a PICC line next Wednesday and then following this we will have the vancomycin and Zosyn that she will be taking for 8 weeks according to what she tells me. I think this is definitely going to be beneficial and again the goal here is limb salvage. In combination with that I think the hyperbaric oxygen therapy would be ultimately significantly helpful for her. 08-07-2022 upon evaluation today patient appears to still be doing quite poorly overall she still has not gotten her antibiotics. She got her PICC line today which I assumed she will be getting the first dose of antibiotics as well during that time. With that being said unfortunately she did not get the antibiotics and in fact as I questioned her further she does not even know when or if that is going to be started this week. Again I am very concerned about this considering she already has a PICC line in place we do not want this to clot off on top of the fact that she should already be on these antibiotics she just never went to the ER for evaluation therefore they  never got this started. At this point based on what I am seeing I really think that the ideal thing would be for [place] to see about getting her to the ER for further evaluation and treatment to see if they can get this  started for her as soon as possible. The patient voiced understanding and is in agreement with the plan. I gave her recommendations for what should be done also called [facility] infusion therapy in order to see if they would be the ones that seem to be available for her IV infusions but they tell me that they no longer do this that is apparently where the orders were sent to for her infusion therapy. That was by her primary care provider. 08-21-2022 upon evaluation today patient's wound on the bottom of the heel and foot area as well as the toe appear to be doing significantly better. Fortunately I do not see any evidence of infection locally or systemically and everything is measuring much smaller than where we have been. The wound in the left gluteal region also is dramatically improved compared to last time she was here. She has started the antibiotics this is cefepime and vancomycin. She did have a somewhat high trough and therefore they have her hold that today and they are to recheck in the morning I believe she told me. Nonetheless with the antibiotics going she seems to be doing significantly better which is great news. 08-28-2022 upon evaluation today patient appears to be doing somewhat better in regard to her foot there is definitely less drainage than what we have seen in the past. I do believe the antibiotics are helping. With that being said she is also been approved for hyperbaric oxygen therapy and I think as soon as we get this started the better. I discussed that with her today as well. With that being said I do think that the last thing she needs is her chest x-ray when she has that done will be ready to start her into treatment. She is going to try to go get that today when she leaves which I think would be ideal. 09-11-2022 upon evaluation today patient appears to be doing well with regard to her foot ulcer. This is showing signs of no digression and she does not appear to be having as  much drainage as before though it still very wet is not nearly the extreme of what it was previous. Fortunately there does not appear to be any signs of active infection locally nor systemically at this point which is great news. Patient did have her chest x-ray as well as EKG and she is actually approved and completely cleared for HBO therapy from both a clinical standpoint as well as an insurance standpoint. 09-25-2022 upon evaluation today patient appears to be doing well in regard to her foot ulcer. Unfortunately she is not doing as well when it comes to her hyperbaric treatments. She is having a lot of trouble with claustrophobia. She tells me in general that she is not sure she is good to be able to continue. 10-09-2022 upon evaluation today patient's wounds actually are showing signs of excellent improvement which is great news. Fortunately I do not see any evidence of infection locally or systemically which is great news and overall I am extremely pleased with where things are currently. I do think that she is making good progress she does tell me at this point today she does not want to go forward with a hyperbaric oxygen  therapy. 10-16-2022 upon evaluation today patient appears to be doing excellent in regard to her foot ulcer. This is actually showing signs of excellent improvement. She still does not allow for any sharp debridement but the good news is this is doing much better and does not even need it at the moment. Nonetheless as far as sharp debridement is concerned this has been too painful for really not been able to do that all along. 10-23-2022 upon evaluation today patient actually is making excellent progress. I am extremely pleased with where we stand I have considered even seeing about putting her in the total contact cast but to be honest she is doing so well currently and still with having some drainage I feel like it is better for her to be able to change it then to be locked up  in the cast for a week at a time. She has voiced understanding as well. Overall though I feel like you are making some really good progress here. 10-30-2022 upon evaluation today patient appears to be doing excellent in regard to her foot ulcers. Both are showing signs of improvement which is great news and overall I feel like that they are measuring smaller, looking better, and I see no signs of resurgence of the infection which is great news. Overall I am extremely pleased at this point. 2/7; the patient has 2 wounds which are remanence of the large wound on the right foot. This was a diabetic wound with underlying osteomyelitis she has completed antibiotics. Today she has the open area on the distal calcaneus and an area on the right midfoot which is eschared. She has been using silver Litzenberger, Liset ([id number]) [phone].pdf Page 4 of 11 alginate and offloading this in a regular running shoe. She works as a Librarian, academic in a long-term care facility but otherwise seems to offload this as much as possible. 11-27-2022 upon evaluation today patient appears to be doing well currently in regard to her wound which is actually showing signs of significant improvement this is great news. Fortunately there does not appear to be any signs of active infection locally nor systemically which is also excellent. No fevers, chills, nausea, vomiting, or diarrhea. 12-25-2022 upon evaluation today patient appears to be doing well currently in regard to the distal portion of her foot which is actually in my opinion looking a little better but have a lot of callus she is going require some debridement here. With that being said the heel actually looks like it might be a little bit worse. There is a lot of callus I cannot tell exactly what is open or not we can remove that and we will see what exactly were dealing with there. With that being said she also has an area on the right side of her leg  which appears to be more of a pressure injury she tells me this is from sitting in her recliner she has gotten a pillow to help take care of this. Fortunately it is not technically open and draining at this point made it very well may be before it is also not done. Electronic Signature(s) Signed: 12/25/2022 10:27:52 AM By: Worthy Keeler PA-C Entered By: Worthy Keeler on 12/25/2022 10:27:52 -------------------------------------------------------------------------------- Physical Exam Details Patient Name: Date of Service: SEVILLA, BEANE 12/25/2022 9:30 A M Medical Record Number: [id number] Patient Account Number: [id number] Date of Birth/Sex: Treating RN: [birth year] (57 y.o. F) Primary Care Provider: Stoney Bang Other Clinician: Referring Provider: Treating Provider/Extender: Judye Bos,  Velvet Bathe Weeks in Treatment: 28 Constitutional Chronically ill appearing but in no apparent acute distress. Respiratory normal breathing without difficulty. Psychiatric this patient is able to make decisions and demonstrates good insight into disease process. Alert and Oriented x 3. pleasant and cooperative. Notes Upon inspection patient's wound bed actually showed signs of good granulation epithelization at this point. Fortunately I do not see any evidence of active infection locally nor systemically which is great news. With that being said I did perform debridement clearing away callus on the plantar aspect of the foot. Distally that all that had to be removed she is has a very small slit open here which is good news. At the heel actually did remove callus as well as some slough and biofilm down to good subcutaneous tissue and a couple of areas that were open and she tolerated that today without complication. Postdebridement this appears to be doing much better. Electronic Signature(s) Signed: 12/25/2022 10:29:04 AM By: Worthy Keeler PA-C Entered By: Worthy Keeler on 12/25/2022  10:29:04 -------------------------------------------------------------------------------- Physician Orders Details Patient Name: Date of Service: ARSIE, PARADOWSKI 12/25/2022 9:30 A M Medical Record Number: [id number] Patient Account Number: [id number] Date of Birth/Sex: Treating RN: 1966-03-05 (57 y.o. [name], [name] Primary Care Provider: Stoney Bang Other Clinician: Referring Provider: Treating Provider/Extender: Dallas Breeding in Treatment: 36 Verbal / Phone Orders: No Diagnosis Coding ICD-10 Coding Code Description [phone] Other chronic osteomyelitis, right ankle and foot E11.621 Type 2 diabetes mellitus with foot ulcer L97.412 Non-pressure chronic ulcer of right heel and midfoot with fat layer exposed I89.0 Lymphedema, not elsewhere classified Follow-up Appointments ppointment in 1 week. Burman Blacksmith and Tammi Klippel rm # 9 Wednesday 01/01/23 @ 9:45 Return A Garfinkel, Anusha ([id number].pdf Page 5 of 11 Anesthetic (In clinic) Topical Lidocaine 5% applied to wound bed Bathing/ Shower/ Hygiene May shower with protection but do not get wound dressing(s) wet. Protect dressing(s) with water repellant cover (for example, large plastic bag) or a cast cover and may then take shower. Edema Control - Lymphedema / SCD / Other Elevate legs to the level of the heart or above for 30 minutes daily and/or when sitting for 3-4 times a day throughout the day. Avoid standing for long periods of time. Off-Loading Other: - Minimize walking and standing as much as possible to right foot to aid in offloading to wound. While at work use the wheelchair for mobility. felt in shoe to aid in offloading wound and heel. Wound Treatment Wound #1 - Calcaneus Wound Laterality: Right Cleanser: Soap and Water 1 x Per Day/30 Days Discharge Instructions: May shower and wash wound with dial antibacterial soap and water prior to dressing change. Cleanser: Wound  Cleanser (Generic) 1 x Per Day/30 Days Discharge Instructions: Cleanse the wound with wound cleanser prior to applying a clean dressing using gauze sponges, not tissue or cotton balls. Peri-Wound Care: Sween Lotion (Moisturizing lotion) 1 x Per Day/30 Days Discharge Instructions: Apply moisturizing lotion as directed Prim Dressing: Wisner 2x2 (in/in) (DME) (Generic) 1 x Per Day/30 Days ary Discharge Instructions: Apply to wound bed as instructed Secondary Dressing: ABD Pad, 5x9 (Generic) 1 x Per Day/30 Days Discharge Instructions: Apply over primary dressing as directed. Secondary Dressing: Woven Gauze Sponge, Non-Sterile 4x4 in (Generic) 1 x Per Day/30 Days Discharge Instructions: Apply over primary dressing as directed. Secondary Dressing: heel ortho felt 1 x Per Day/30 Days Discharge Instructions: apply in shoe to aid in offloading of heel and wound. Secured With:  Conforming Stretch Gauze Bandage Roll, Sterile 4x75 (in/in) (Generic) 1 x Per Day/30 Days Discharge Instructions: Secure with stretch gauze as directed. Secured With: 52M Medipore H Soft Cloth Surgical T ape, 4 x 10 (in/yd) (Generic) 1 x Per Day/30 Days Discharge Instructions: Secure with tape as directed. Wound #4R - Foot Wound Laterality: Plantar, Right, Distal Cleanser: Soap and Water 1 x Per Day/30 Days Discharge Instructions: May shower and wash wound with dial antibacterial soap and water prior to dressing change. Cleanser: Wound Cleanser (Generic) 1 x Per Day/30 Days Discharge Instructions: Cleanse the wound with wound cleanser prior to applying a clean dressing using gauze sponges, not tissue or cotton balls. Peri-Wound Care: Sween Lotion (Moisturizing lotion) 1 x Per Day/30 Days Discharge Instructions: Apply moisturizing lotion as directed Prim Dressing: Joffre 2x2 (in/in) (DME) (Generic) 1 x Per Day/30 Days ary Discharge Instructions: Apply to wound bed as instructed Secondary Dressing:  ABD Pad, 5x9 (Generic) 1 x Per Day/30 Days Discharge Instructions: Apply over primary dressing as directed. Secondary Dressing: Woven Gauze Sponge, Non-Sterile 4x4 in (Generic) 1 x Per Day/30 Days Discharge Instructions: Apply over primary dressing as directed. Secondary Dressing: heel ortho felt 1 x Per Day/30 Days Discharge Instructions: apply in shoe to aid in offloading of heel and wound. Secured With: Scientist, forensic, Sterile 4x75 (in/in) (Generic) 1 x Per Day/30 Days Discharge Instructions: Secure with stretch gauze as directed. Secured With: 52M Medipore H Soft Cloth Surgical T ape, 4 x 10 (in/yd) (Generic) 1 x Per Day/30 Days Discharge Instructions: Secure with tape as directed. Wound #5 - Lower Leg Wound Laterality: Right, Lateral Cleanser: Soap and Water 1 x Per Day/30 Days Discharge Instructions: May shower and wash wound with dial antibacterial soap and water prior to dressing change. Estrin, [name] ([id number]) [id number] Page 6 of 11 Cleanser: Wound Cleanser (Generic) 1 x Per Day/30 Days Discharge Instructions: Cleanse the wound with wound cleanser prior to applying a clean dressing using gauze sponges, not tissue or cotton balls. Peri-Wound Care: Sween Lotion (Moisturizing lotion) 1 x Per Day/30 Days Discharge Instructions: Apply moisturizing lotion as directed Prim Dressing: Sorbalgon AG Dressing 2x2 (in/in) (DME) (Generic) 1 x Per Day/30 Days ary Discharge Instructions: Apply to wound bed as instructed Secondary Dressing: Zetuvit Plus Silicone Border Dressing 4x4 (in/in) 1 x Per Day/30 Days Discharge Instructions: Apply silicone border over primary dressing as directed. Patient Medications llergies: No Known Allergies A Notifications Medication Indication Start End 12/25/2022 cefdinir DOSE 1 - oral 300 mg capsule - 1 capsule oral twice a day x 14 days. Do not take magnesium at the same time as this medication Electronic  Signature(s) Signed: 12/25/2022 6:05:31 PM By: Worthy Keeler PA-C Signed: 12/27/2022 11:12:19 AM By: Rhae Hammock RN Previous Signature: 12/25/2022 10:30:08 AM Version By: Worthy Keeler PA-C Entered By: Rhae Hammock on 12/25/2022 10:31:38 -------------------------------------------------------------------------------- Problem List Details Patient Name: Date of Service: KIRAT, GIN 12/25/2022 9:30 A M Medical Record Number: [id number] Patient Account Number: [id number] Date of Birth/Sex: Treating RN: 10-05-1966 (57 y.o. F) Primary Care Provider: Stoney Bang Other Clinician: Referring Provider: Treating Provider/Extender: Dallas Breeding in Treatment: 28 Active Problems ICD-10 Encounter Code Description Active Date MDM Diagnosis [phone] Other chronic osteomyelitis, right ankle and foot 08/28/2022 No Yes E11.621 Type 2 diabetes mellitus with foot ulcer 06/12/2022 No Yes L97.412 Non-pressure chronic ulcer of right heel and midfoot with fat layer exposed 06/12/2022 No Yes I89.0 Lymphedema, not elsewhere classified 06/12/2022 No Yes Inactive  Problems Resolved Problems Electronic Signature(s) Signed: 12/25/2022 9:49:32 AM By: Worthy Keeler PA-C Entered By: Worthy Keeler on 12/25/2022 09:49:32 Hoagland, Kaia ([id number] Page 7 of 11 -------------------------------------------------------------------------------- Progress Note Details Patient Name: Date of Service: ZAARA, DICKHAUT 12/25/2022 9:30 A M Medical Record Number: [id number] Patient Account Number: [id number] Date of Birth/Sex: Treating RN: 1966/04/30 (57 y.o. F) Primary Care Provider: Stoney Bang Other Clinician: Referring Provider: Treating Provider/Extender: Dallas Breeding in Treatment: 28 Subjective Chief Complaint Information obtained from Patient Right heel ulcer History of Present Illness (HPI) 06-13-2022 upon evaluation today  patient presents for evaluation of her right heel ulcer. She is having a tremendous amount of pain at this location. She tells me that her most recent hemoglobin A1c was 8.7 and that was on 08-23-2021. This ulcer on the heel has been present she states since February 2023 when she had an injection to the heel by podiatry. She states that she began to have increasing pain the wound opened and has been open ever since. 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 She had an x-ray in June if anything can although there did not appear to be any obvious evidence of osteomyelitis at that point. She subsequently has had OR debridement several times of the wound performed by podiatry. Patient does have a history of lymphedema of the lower extremities she is also a type II diabetic. 06-19-2022 upon evaluation today patient appears to be doing better in regard to the size of her leg which is significantly improved. With that being said she had a lot of drainage from the heel which is completely understandable considering what is going on at this time. There does not appear to be any evidence of active infection there is no warmth or irritation to the leg in general. With that being said the patient does have an issue here with the amount of drainage that is going on I definitely think Zetuvit is good to be the better way to go in place of ABD pads. Also think that she could potentially benefit from 3 times per week dressing changes but again right now we are going to stick with the to change it today, change in her Friday, and then subsequently seeing where things stand next Wednesday. The other option would be to change her to a different day for wound care having her come then say like a Tuesday Friday or Monday Thursday. 06-26-2022 upon evaluation patient's wound bed actually showed signs of doing well in regard to the overall size it was measuring slightly smaller. With that being  said unfortunately the biggest issue we see is she still has a tremendous amount of drainage which is what could keep this from being able to use a total contact cast. For that reason I am did going to discuss with her today the possibility of trying Tubigrip to see if that could be of benefit for her. She voiced understanding and is in agreement with giving this a try. 07-03-2022 upon evaluation today patient unfortunately appears to be doing significantly worse at this point in regard to her swelling due to the fact that she was unable to really wear the Tubigrip. She tells me that it was cutting into her. With that being said she also did have an MRI this MRI revealed that she does have osteomyelitis in the heel this was actually just performed Monday, 25 September. This does show signs of "early osteomyelitis". Nonetheless this is  still concerning to me. The wound also appears to be getting deeper in the center aspect of this which has me a little concerned as well. I do believe that she is likely going require an aggressive approach here to try to get things better. I discussed that in greater detail with her today. I will detailed in the plan. 07-17-2022 upon evaluation today patient appears to be doing poorly in regard to her wound. Unfortunately she does not show any signs of infection systemically though locally this seems to be doing worse with the wound actually being bigger than where it was previous. She did have an MRI that showed evidence of osteomyelitis actually recommended a referral to ID she was evaluated infectious disease and they recommended referral to podiatry and to be honest have recommended amputation based on what the patient tells me today. With that being said this is something that she is not interested in at all to be perfectly honest. For that reason she wants to know what she should do and where she should go at this point that is where the majority of the  conversation which was quite lengthy today went. Obviously understand her concern here but I think she is going to have to definitely get off this and likely this means she is going to need to come out of work. 10/18; Since the patient was the patient is using Hydrofera Blue on her right heel wound. She has a new larger open area superior to the wound. The skin on the bottom of her foot is completely macerated. I reviewed the infectious disease note on this patient from 10/9. They recommended keeping the patient on Delofloxacin 450 twice daily until follow-up tomorrow. The patient states she is not going back there as the only thing they wanted to do was "cut my leg off]. She has not seen podiatry. Her lab works shows a CRP markedly elevated at 240.2 a sedimentation rate of 96 the rest of her CBC is normal creatinine of 0.38 The patient has been to see her primary doctor who is Dr. Melonie Florida [SPo] In [place]. According the patient Dr. Mylinda Latina has ordered vancomycin and Zosyn to start on November 1. She is not taking the delofloxacin that was suggested by infectious disease. Very angry that they just wanted to consider her for an amputation although I do not specifically see that stated in their note 07-31-2022 upon evaluation today patient still has a significant wound over her plantar aspect of her right l foot region. With that being said I am definitely concerned about the fact that the patient probably does need to be in hyperbaric oxygen therapy at this point. I think we should try to get this going as quickly as possible. She actually is going to be set up with a PICC line next Wednesday and then following this we will have the vancomycin and Zosyn that she will be taking for 8 weeks according to what she tells me. I think this is definitely going to be beneficial and again the goal here is limb salvage. In combination with that I think the hyperbaric oxygen therapy would be ultimately significantly  helpful for her. 08-07-2022 upon evaluation today patient appears to still be doing quite poorly overall she still has not gotten her antibiotics. She got her PICC line today which I assumed she will be getting the first dose of antibiotics as well during that time. With that being said unfortunately she did not get the antibiotics and in  fact as I questioned her further she does not even know when or if that is going to be started this week. Again I am very concerned about this considering she already has a PICC line in place we do not want this to clot off on top of the fact that she should already be on these antibiotics she just never went to the ER for evaluation therefore they never got this started. At this point based on what I am seeing I really think that the ideal thing would be for [place] to see about getting her to the ER for further evaluation and treatment to see if they can get this started for her as soon as possible. The patient voiced understanding and is in agreement with the plan. I gave her recommendations for what should be done also called [facility] infusion therapy in order to see if they would be the ones that seem to be available for her IV infusions but they tell me that they no longer do this that is apparently where the orders were sent to for her infusion therapy. That was by her primary care provider. 08-21-2022 upon evaluation today patient's wound on the bottom of the heel and foot area as well as the toe appear to be doing significantly better. Fortunately I do not see any evidence of infection locally or systemically and everything is measuring much smaller than where we have been. The wound in the left gluteal region also is dramatically improved compared to last time she was here. She has started the antibiotics this is cefepime and vancomycin. She did have a somewhat high trough and therefore they have her hold that today and they are to recheck in the morning I believe she  told me. Nonetheless with the antibiotics going she seems to be doing significantly better which is great news. 08-28-2022 upon evaluation today patient appears to be doing somewhat better in regard to her foot there is definitely less drainage than what we have seen in [place], [place] ([id number]) [id number].pdf Page 8 of 11 the past. I do believe the antibiotics are helping. With that being said she is also been approved for hyperbaric oxygen therapy and I think as soon as we get this started the better. I discussed that with her today as well. With that being said I do think that the last thing she needs is her chest x-ray when she has that done will be ready to start her into treatment. She is going to try to go get that today when she leaves which I think would be ideal. 09-11-2022 upon evaluation today patient appears to be doing well with regard to her foot ulcer. This is showing signs of no digression and she does not appear to be having as much drainage as before though it still very wet is not nearly the extreme of what it was previous. Fortunately there does not appear to be any signs of active infection locally nor systemically at this point which is great news. Patient did have her chest x-ray as well as EKG and she is actually approved and completely cleared for HBO therapy from both a clinical standpoint as well as an insurance standpoint. 09-25-2022 upon evaluation today patient appears to be doing well in regard to her foot ulcer. Unfortunately she is not doing as well when it comes to her hyperbaric treatments. She is having a lot of trouble with claustrophobia. She tells me in general that she is not sure she is good  to be able to continue. 10-09-2022 upon evaluation today patient's wounds actually are showing signs of excellent improvement which is great news. Fortunately I do not see any evidence of infection locally or systemically which is great news and overall  I am extremely pleased with where things are currently. I do think that she is making good progress she does tell me at this point today she does not want to go forward with a hyperbaric oxygen therapy. 10-16-2022 upon evaluation today patient appears to be doing excellent in regard to her foot ulcer. This is actually showing signs of excellent improvement. She still does not allow for any sharp debridement but the good news is this is doing much better and does not even need it at the moment. Nonetheless as far as sharp debridement is concerned this has been too painful for really not been able to do that all along. 10-23-2022 upon evaluation today patient actually is making excellent progress. I am extremely pleased with where we stand I have considered even seeing about putting her in the total contact cast but to be honest she is doing so well currently and still with having some drainage I feel like it is better for her to be able to change it then to be locked up in the cast for a week at a time. She has voiced understanding as well. Overall though I feel like you are making some really good progress here. 10-30-2022 upon evaluation today patient appears to be doing excellent in regard to her foot ulcers. Both are showing signs of improvement which is great news and overall I feel like that they are measuring smaller, looking better, and I see no signs of resurgence of the infection which is great news. Overall I am extremely pleased at this point. 2/7; the patient has 2 wounds which are remanence of the large wound on the right foot. This was a diabetic wound with underlying osteomyelitis she has completed antibiotics. Today she has the open area on the distal calcaneus and an area on the right midfoot which is eschared. She has been using silver alginate and offloading this in a regular running shoe. She works as a Librarian, academic in a long-term care facility but otherwise seems to offload this as much  as possible. 11-27-2022 upon evaluation today patient appears to be doing well currently in regard to her wound which is actually showing signs of significant improvement this is great news. Fortunately there does not appear to be any signs of active infection locally nor systemically which is also excellent. No fevers, chills, nausea, vomiting, or diarrhea. 12-25-2022 upon evaluation today patient appears to be doing well currently in regard to the distal portion of her foot which is actually in my opinion looking a little better but have a lot of callus she is going require some debridement here. With that being said the heel actually looks like it might be a little bit worse. There is a lot of callus I cannot tell exactly what is open or not we can remove that and we will see what exactly were dealing with there. With that being said she also has an area on the right side of her leg which appears to be more of a pressure injury she tells me this is from sitting in her recliner she has gotten a pillow to help take care of this. Fortunately it is not technically open and draining at this point made it very well may be before it is  also not done. Objective Constitutional Chronically ill appearing but in no apparent acute distress. Vitals Time Taken: 9:42 AM, Height: 65 in, Weight: 331 lbs, BMI: 55.1, Temperature: 98.3 F, Pulse: 102 bpm, Respiratory Rate: 17 breaths/min, Blood Pressure: 136/85 mmHg. Respiratory normal breathing without difficulty. Psychiatric this patient is able to make decisions and demonstrates good insight into disease process. Alert and Oriented x 3. pleasant and cooperative. General Notes: Upon inspection patient's wound bed actually showed signs of good granulation epithelization at this point. Fortunately I do not see any evidence of active infection locally nor systemically which is great news. With that being said I did perform debridement clearing away callus on the  plantar aspect of the foot. Distally that all that had to be removed she is has a very small slit open here which is good news. At the heel actually did remove callus as well as some slough and biofilm down to good subcutaneous tissue and a couple of areas that were open and she tolerated that today without complication. Postdebridement this appears to be doing much better. Integumentary (Hair, Skin) Wound #1 status is Open. Original cause of wound was Gradually Appeared. The date acquired was: 11/07/2020. The wound has been in treatment 28 weeks. The wound is located on the Right Calcaneus. The wound measures 0.7cm length x 1.7cm width x 0.4cm depth; 0.935cm^2 area and 0.374cm^3 volume. There is Fat Layer (Subcutaneous Tissue) exposed. There is no tunneling noted, however, there is undermining starting at 12:00 and ending at 12:00 with a maximum distance of 1.5cm. There is a medium amount of serosanguineous drainage noted. The wound margin is thickened. There is large (67-100%) red, pink granulation within the wound bed. There is a small (1-33%) amount of necrotic tissue within the wound bed including Adherent Slough. The periwound skin appearance exhibited: Callus, Maceration. The periwound skin appearance did not exhibit: Crepitus, Excoriation, Induration, Rash, Scarring, Dry/Scaly, Atrophie Blanche, Cyanosis, Ecchymosis, Hemosiderin Staining, Mottled, Pallor, Rubor, Erythema. Periwound temperature was noted as No Abnormality. The periwound has tenderness on palpation. Wound #4R status is Open. Original cause of wound was Blister. The date acquired was: 11/08/2020. The wound has been in treatment 10 weeks. The wound is located on the [place]. The wound measures 0.1cm length x 0.1cm width x 0.1cm depth; 0.008cm^2 area and 0.001cm^3 volume. There is Fat Layer (Subcutaneous Tissue) exposed. There is a medium amount of serosanguineous drainage noted. The wound margin is distinct with the  outline attached to the wound base. There is large (67-100%) red granulation within the wound bed. There is no necrotic tissue within the wound bed. The periwound skin appearance exhibited: Callus. The periwound skin appearance did not exhibit: Crepitus, Excoriation, Induration, Rash, Scarring, Dry/Scaly, Maceration, Atrophie Blanche, Cyanosis, Ecchymosis, Hemosiderin Staining, Mottled, Pallor, Rubor, Erythema. Wound #5 status is Open. Original cause of wound was Gradually Appeared. The date acquired was: 07/07/2022. The wound is located on the [place], [place] ([id number]) [id number] Page 9 of 11 Leg. The wound measures 1.4cm length x 1.4cm width x 0.1cm depth; 1.539cm^2 area and 0.154cm^3 volume. There is no tunneling or undermining noted. There is a medium amount of serosanguineous drainage noted. The wound margin is distinct with the outline attached to the wound base. There is no granulation within the wound bed. There is a large (67-100%) amount of necrotic tissue within the wound bed including Eschar. The periwound skin appearance exhibited: Dry/Scaly, Erythema. The periwound skin appearance did not exhibit: Callus, Crepitus, Excoriation, Induration, Rash, Scarring, Maceration,  Atrophie Blanche, Cyanosis, Ecchymosis, Hemosiderin Staining, Mottled, Pallor, Rubor. The surrounding wound skin color is noted with erythema which is circumferential. Periwound temperature was noted as Hot. The periwound has tenderness on palpation. Assessment Active Problems ICD-10 Other chronic osteomyelitis, right ankle and foot Type 2 diabetes mellitus with foot ulcer Non-pressure chronic ulcer of right heel and midfoot with fat layer exposed Lymphedema, not elsewhere classified Procedures Wound #1 Pre-procedure diagnosis of Wound #1 is a Diabetic Wound/Ulcer of the Lower Extremity located on the Right Calcaneus .Severity of Tissue Pre Debridement is: Fat layer  exposed. There was a Excisional Skin/Subcutaneous Tissue Debridement with a total area of 1.19 sq cm performed by Worthy Keeler, PA. With the following instrument(s): Curette to remove Viable and Non-Viable tissue/material. Material removed includes Callus, Subcutaneous Tissue, Slough, Skin: Dermis, and Skin: Epidermis after achieving pain control using Lidocaine. No specimens were taken. A time out was conducted at 10:15, prior to the start of the procedure. A Minimum amount of bleeding was controlled with Pressure. The procedure was tolerated well with a pain level of 0 throughout and a pain level of 0 following the procedure. Post Debridement Measurements: 0.7cm length x 1.7cm width x 0.4cm depth; 0.374cm^3 volume. Character of Wound/Ulcer Post Debridement is improved. Severity of Tissue Post Debridement is: Fat layer exposed. Post procedure Diagnosis Wound #1: Same as Pre-Procedure Wound #4R Pre-procedure diagnosis of Wound #4R is a Diabetic Wound/Ulcer of the Lower Extremity located on the Right,Distal,Plantar Foot .Severity of Tissue Pre Debridement is: Fat layer exposed. There was a Selective/Open Wound Skin/Epidermis Debridement with a total area of 25 sq cm performed by Worthy Keeler, PA. With the following instrument(s): Curette to remove Viable and Non-Viable tissue/material. Material removed includes Callus, Skin: Dermis, and Skin: Epidermis after achieving pain control using Lidocaine. No specimens were taken. A time out was conducted at 10:15, prior to the start of the procedure. A Minimum amount of bleeding was controlled with Pressure. The procedure was tolerated well with a pain level of 0 throughout and a pain level of 0 following the procedure. Post Debridement Measurements: 0.1cm length x 0.1cm width x 0.1cm depth; 0.001cm^3 volume. Character of Wound/Ulcer Post Debridement is improved. Severity of Tissue Post Debridement is: Fat layer exposed. Post procedure Diagnosis Wound  #4R: Same as Pre-Procedure Plan Follow-up Appointments: Return Appointment in 1 week. Burman Blacksmith and Tammi Klippel rm # 9 Wednesday 01/01/23 @ 9:45 Anesthetic: (In clinic) Topical Lidocaine 5% applied to wound bed Bathing/ Shower/ Hygiene: May shower with protection but do not get wound dressing(s) wet. Protect dressing(s) with water repellant cover (for example, large plastic bag) or a cast cover and may then take shower. Edema Control - Lymphedema / SCD / Other: Elevate legs to the level of the heart or above for 30 minutes daily and/or when sitting for 3-4 times a day throughout the day. Avoid standing for long periods of time. Off-Loading: Other: - Minimize walking and standing as much as possible to right foot to aid in offloading to wound. While at work use the wheelchair for mobility. felt in shoe to aid in offloading wound and heel. The following medication(s) was prescribed: cefdinir oral 300 mg capsule 1 1 capsule oral twice a day x 14 days. Do not take magnesium at the same time as this medication starting 12/25/2022 WOUND #1: - Calcaneus Wound Laterality: Right Cleanser: Soap and Water 1 x Per Day/30 Days Discharge Instructions: May shower and wash wound with dial antibacterial soap and water prior  to dressing change. Cleanser: Wound Cleanser (Generic) 1 x Per Day/30 Days Discharge Instructions: Cleanse the wound with wound cleanser prior to applying a clean dressing using gauze sponges, not tissue or cotton balls. Peri-Wound Care: Sween Lotion (Moisturizing lotion) 1 x Per Day/30 Days Discharge Instructions: Apply moisturizing lotion as directed Prim Dressing: [place] 2x2 (in/in) (DME) (Generic) 1 x Per Day/30 Days ary Discharge Instructions: Apply to wound bed as instructed Secondary Dressing: ABD Pad, 5x9 (Generic) 1 x Per Day/30 Days Discharge Instructions: Apply over primary dressing as directed. Secondary Dressing: Woven Gauze Sponge, Non-Sterile 4x4 in (Generic) 1  x Per Day/30 Days Discharge Instructions: Apply over primary dressing as directed. Secondary Dressing: heel ortho felt 1 x Per Day/30 Days Discharge Instructions: apply in shoe to aid in offloading of heel and wound. Secured With: Scientist, forensic, Sterile 4x75 (in/in) (Generic) 1 x Per Day/30 Days Discharge Instructions: Secure with stretch gauze as directed. Hoskinson, [name] ([id number]) [id number] Page 10 of 11 Secured With: 72M Medipore H Soft Cloth Surgical T ape, 4 x 10 (in/yd) (Generic) 1 x Per Day/30 Days Discharge Instructions: Secure with tape as directed. WOUND #4R: - Foot Wound Laterality: Plantar, Right, Distal Cleanser: Soap and Water 1 x Per Day/30 Days Discharge Instructions: May shower and wash wound with dial antibacterial soap and water prior to dressing change. Cleanser: Wound Cleanser (Generic) 1 x Per Day/30 Days Discharge Instructions: Cleanse the wound with wound cleanser prior to applying a clean dressing using gauze sponges, not tissue or cotton balls. Peri-Wound Care: Sween Lotion (Moisturizing lotion) 1 x Per Day/30 Days Discharge Instructions: Apply moisturizing lotion as directed Prim Dressing: Lyman 2x2 (in/in) (DME) (Generic) 1 x Per Day/30 Days ary Discharge Instructions: Apply to wound bed as instructed Secondary Dressing: ABD Pad, 5x9 (Generic) 1 x Per Day/30 Days Discharge Instructions: Apply over primary dressing as directed. Secondary Dressing: Woven Gauze Sponge, Non-Sterile 4x4 in (Generic) 1 x Per Day/30 Days Discharge Instructions: Apply over primary dressing as directed. Secondary Dressing: heel ortho felt 1 x Per Day/30 Days Discharge Instructions: apply in shoe to aid in offloading of heel and wound. Secured With: Scientist, forensic, Sterile 4x75 (in/in) (Generic) 1 x Per Day/30 Days Discharge Instructions: Secure with stretch gauze as directed. Secured With: 72M  Medipore H Soft Cloth Surgical T ape, 4 x 10 (in/yd) (Generic) 1 x Per Day/30 Days Discharge Instructions: Secure with tape as directed. WOUND #5: - Lower Leg Wound Laterality: Right, Lateral Cleanser: Soap and Water 1 x Per Day/30 Days Discharge Instructions: May shower and wash wound with dial antibacterial soap and water prior to dressing change. Cleanser: Wound Cleanser (Generic) 1 x Per Day/30 Days Discharge Instructions: Cleanse the wound with wound cleanser prior to applying a clean dressing using gauze sponges, not tissue or cotton balls. Peri-Wound Care: Sween Lotion (Moisturizing lotion) 1 x Per Day/30 Days Discharge Instructions: Apply moisturizing lotion as directed Prim Dressing: Sorbalgon AG Dressing 2x2 (in/in) (DME) (Generic) 1 x Per Day/30 Days ary Discharge Instructions: Apply to wound bed as instructed Secondary Dressing: Zetuvit Plus Silicone Border Dressing 4x4 (in/in) 1 x Per Day/30 Days Discharge Instructions: Apply silicone border over primary dressing as directed. 1. I am recommend currently that we have the patient continue to for any signs of worsening or infection. Based on what I am seeing I do believe that we are monitor headed in the right direction at this point but again she does seem to have  some cellulitis of the leg as well which I do not like. I am to see about getting her started on Omnicef. 2. I am good recommend as well that we have the patient go ahead and continue with the silver alginate dressing which I think is still going to be a good option for her currently. She is in agreement with the plan. 3. She should continue with appropriate offloading but she also needs to make sure that she is not developing any pressure injuries at any other location either. We will see patient back for reevaluation in 1 week here in the clinic. If anything worsens or changes patient will contact our office for additional recommendations. Electronic Signature(s) Signed:  12/30/2022 4:40:03 PM By: Deon Pilling RN, BSN Signed: 01/01/2023 8:45:37 AM By: Worthy Keeler PA-C Previous Signature: 12/25/2022 10:30:32 AM Version By: Worthy Keeler PA-C Entered By: Deon Pilling on 12/30/2022 15:01:38 -------------------------------------------------------------------------------- SuperBill Details Patient Name: Date of Service: FIONNA, FREUNDLICH 12/25/2022 Medical Record Number: [id number] Patient Account Number: [id number] Date of Birth/Sex: Treating RN: 04-Mar-1966 (57 y.o. 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 Modifier: Quantity: 1 : Nudelman, C CPT4 Code: [id number] [name] ([id number]) IC L Description: [id number] - DEBRIDE WOUND 1ST 20 SQ CM OR < [id number] D-10 Diagnosis Description 97.412 Non-pressure chronic ulcer of right heel and midfoot with fat layer exposed Modifier: [id number] Page Quantity: 1 11 of 11 : CPT4 Code: [id number] IC L Description: [id number] - DEBRIDE WOUND EA ADDL 20 SQ CM D-10 Diagnosis Description 97.412 Non-pressure chronic ulcer of right heel and midfoot with fat layer exposed Modifier: 1 Quantity: Physician Procedures : CPT4 Code Description Modifier [id number] - WC PHYS LEVEL 4 - EST PT 25 ICD-10 Diagnosis Description M86.671 Other chronic osteomyelitis, right ankle and foot E11.621 Type 2 diabetes mellitus with foot ulcer L97.412  Non-pressure chronic ulcer of  right heel and midfoot with fat layer exposed I89.0 Lymphedema, not elsewhere classified Quantity: 1 : [id number] - WC PHYS SUBQ TISS 20 SQ CM ICD-10 Diagnosis Description 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

## 2022-12-28 NOTE — Progress Notes (Signed)
Perham, [name] ([id number]) [id number] Page 1 of 9 Visit Report for 12/25/2022 Arrival Information Details Patient Name: Date of Service: AVANELL, CAJINA 12/25/2022 9:30 A M Medical Record Number: [id number] Patient Account Number: [id number] Date of Birth/Sex: Treating RN: Dec 15, 1965 (57 y.o. [name], [name] Primary Care Neya Creegan: Stoney Bang Other Clinician: Referring Kerolos Nehme: Treating Jacorian Golaszewski/Extender: [place] Breeding in Treatment: 28 Visit Information History Since Last Visit Added or deleted any medications: No Patient Arrived: Ambulatory Any new allergies or adverse reactions: No Arrival Time: 09:42 Had a fall or experienced change in No Accompanied By: son activities of daily living that may affect Transfer Assistance: Manual risk of falls: Patient Identification Verified: Yes Signs or symptoms of abuse/neglect since last visito No Secondary Verification Process Completed: Yes Hospitalized since last visit: No Patient Requires Transmission-Based Precautions: No Implantable device outside of the clinic excluding No Patient Has Alerts: Yes cellular tissue based products placed in the center Patient Alerts: PICC in left arm since last visit: Has Dressing in Place as Prescribed: Yes Pain Present Now: Yes Electronic Signature(s) Signed: 12/27/2022 11:12:19 AM By: Rhae Hammock RN Entered By: Rhae Hammock on 12/25/2022 09:42:26 -------------------------------------------------------------------------------- Encounter Discharge Information Details Patient Name: Date of Service: EUN, SURRATT 12/25/2022 9:30 A M Medical Record Number: [id number] Patient Account Number: [id number] Date of Birth/Sex: Treating RN: 08/07/1966 (57 y.o. [name], [name] Primary Care Jermale Crass: Stoney Bang Other Clinician: Referring Mame Twombly: Treating Alliyah Roesler/Extender: [place] Breeding in Treatment: 69 Encounter Discharge  Information Items Post Procedure Vitals Discharge Condition: Stable Temperature (F): 98.7 Ambulatory Status: Ambulatory Pulse (bpm): 74 Discharge Destination: Home Respiratory Rate (breaths/min): 17 Transportation: Private Auto Blood Pressure (mmHg): 120/80 Accompanied By: son Schedule Follow-up Appointment: Yes Clinical Summary of Care: Patient Declined Electronic Signature(s) Signed: 12/27/2022 11:12:19 AM By: Rhae Hammock RN Entered By: Rhae Hammock on 12/25/2022 10:35:09 -------------------------------------------------------------------------------- Lower Extremity Assessment Details Patient Name: Date of Service: NABILA, REIDA 12/25/2022 9:30 A M Medical Record Number: [id number] Patient Account Number: [id number] Date of Birth/Sex: Treating RN: 12-27-1965 (58 y.o. [name], [name] Primary Care Jamylah Marinaccio: Stoney Bang Other Clinician: Referring Yulitza Shorts: Treating Hien [name]/Extender: Matt Holmes Weeks in Treatment: 28 Edema Assessment Assessed: Shirlyn Goltz: No] Patrice Paradise: Yes] E[LeftLurlean Leyden, [name] [id number] [[id number] Page 2 of 9] Edema: [Left: Ye] [Right: s] Calf Left: Right: Point of Measurement: 32 cm From Medial Instep 59 cm Ankle Left: Right: Point of Measurement: 10 cm From Medial Instep 31 cm Vascular Assessment Pulses: Dorsalis Pedis Palpable: [Right:Yes] Posterior Tibial Palpable: [Right:Yes] Electronic Signature(s) Signed: 12/27/2022 11:12:19 AM By: Rhae Hammock RN Entered By: Rhae Hammock on 12/25/2022 09:51:26 -------------------------------------------------------------------------------- Multi-Disciplinary Care Plan Details Patient Name: Date of Service: FILZA, STEFFENS 12/25/2022 9:30 A M Medical Record Number: [id number] Patient Account Number: [id number] Date of Birth/Sex: Treating RN: 03/31/1966 (57 y.o. [name], [name] Primary Care Joelle Flessner: Stoney Bang Other  Clinician: Referring Kanijah Groseclose: Treating Reisha Wos/Extender: [place] Breeding in Treatment: 28 Active Inactive Wound/Skin Impairment Nursing Diagnoses: Impaired tissue integrity Knowledge deficit related to ulceration/compromised skin integrity Goals: Patient will have a decrease in wound volume by X% from date: (specify in notes) Date Initiated: 06/12/2022 Target Resolution Date: 01/04/2023 Goal Status: Active Patient/caregiver will verbalize understanding of skin care regimen Date Initiated: 06/12/2022 Target Resolution Date: 01/04/2023 Goal Status: Active Ulcer/skin breakdown will have a volume reduction of 30% by week 4 Date Initiated: 06/12/2022 Date Inactivated: 07/31/2022 Target Resolution Date: 08/03/2022 Goal Status: Unmet Unmet Reason: dx with osteomyelitis. Ulcer/skin breakdown will have a  volume reduction of 50% by week 8 Date Initiated: 06/12/2022 Date Inactivated: 10/16/2022 Target Resolution Date: 10/05/2022 Unmet Reason: see wound Goal Status: Unmet measurement. IV antibiotics Interventions: Assess patient/caregiver ability to obtain necessary supplies Assess patient/caregiver ability to perform ulcer/skin care regimen upon admission and as needed Assess ulceration(s) every visit Notes: Electronic Signature(s) Signed: 12/27/2022 11:12:19 AM By: Rhae Hammock RN Entered By: Rhae Hammock on 12/25/2022 10:05:53 Billiot, Jayliana ([id number]) [id number] Page 3 of 9 -------------------------------------------------------------------------------- Pain Assessment Details Patient Name: Date of Service: JILLANA, DIBERT 12/25/2022 9:30 A M Medical Record Number: [id number] Patient Account Number: [id number] Date of Birth/Sex: Treating RN: 14-Feb-1966 (57 y.o. [name], [name] Primary Care Brenan Modesto: Stoney Bang Other Clinician: Referring Langdon Crosson: Treating Helvi Royals/Extender: [place] Breeding in Treatment:  28 Active Problems Location of Pain Severity and Description of Pain Patient Has Paino Yes Site Locations Pain Location: Generalized Pain, Pain in Ulcers With Dressing Change: Yes Duration of the Pain. Constant / Intermittento Intermittent Rate the pain. Current Pain Level: 7 Worst Pain Level: 10 Least Pain Level: 0 Tolerable Pain Level: 7 Character of Pain Describe the Pain: Aching Pain Management and Medication Current Pain Management: Medication: No Cold Application: No Rest: No Massage: No Activity: No T.E.N.S.: No Heat Application: No Leg drop or elevation: No Is the Current Pain Management Adequate: Adequate How does your wound impact your activities of daily livingo Sleep: No Bathing: No Appetite: No Relationship With Others: No Bladder Continence: No Emotions: No Bowel Continence: No Work: No Toileting: No Drive: No Dressing: No Hobbies: No Electronic Signature(s) Signed: 12/27/2022 11:12:19 AM By: Rhae Hammock RN Entered By: Rhae Hammock on 12/25/2022 09:42:55 -------------------------------------------------------------------------------- Patient/Caregiver Education Details Patient Name: Date of Service: Sabino Snipes 3/20/2024andnbsp9:30 A M Medical Record Number: [id number] Patient Account Number: [id number] Date of Birth/Gender: Treating RN: Sep 03, 1966 (57 y.o. [name], [name] Primary Care Physician: Stoney Bang Other Clinician: Referring Physician: Treating Physician/Extender: Dallas Breeding in Treatment: 58 Education Assessment Education Provided To: Patient Grigoryan, [name] ([id number]) [id number] Page 4 of 9 Education Topics Provided Wound/Skin Impairment: Methods: Explain/Verbal Responses: Reinforcements needed, State content correctly Electronic Signature(s) Signed: 12/27/2022 11:12:19 AM By: Rhae Hammock RN Entered By: Rhae Hammock on 12/25/2022  10:06:05 -------------------------------------------------------------------------------- Wound Assessment Details Patient Name: Date of Service: LINDLEY, LANZI 12/25/2022 9:30 A M Medical Record Number: [id number] Patient Account Number: [id number] Date of Birth/Sex: Treating RN: 30-Nov-1965 (57 y.o. [name], [name] Primary Care Woodruff Skirvin: Stoney Bang Other Clinician: Referring Janeliz Prestwood: Treating Benisha Hadaway/Extender: Dallas Breeding in Treatment: 28 Wound Status Wound Number: 1 Primary Etiology: Diabetic Wound/Ulcer of the Lower Extremity Wound Location: Right Calcaneus Wound Status: Open Wounding Event: Gradually Appeared Comorbid History: Hypertension, Type II Diabetes, Osteomyelitis Date Acquired: 11/07/2020 Weeks Of Treatment: 28 Clustered Wound: Yes Photos Wound Measurements Length: (cm) 0.7 Width: (cm) 1.7 Depth: (cm) 0.4 Clustered Quantity: 2 Area: (cm) 0.935 Volume: (cm) 0.374 % Reduction in Area: 92% % Reduction in Volume: 83.9% Epithelialization: Large (67-100%) Tunneling: No Undermining: Yes Starting Position (o'clock): 12 Ending Position (o'clock): 12 Maximum Distance: (cm) 1.5 Wound Description Classification: Grade 3 Wound Margin: Thickened Exudate Amount: Medium Exudate Type: Serosanguineous Exudate Color: red, brown Foul Odor After Cleansing: No Slough/Fibrino Yes Wound Bed Granulation Amount: Large (67-100%) Exposed Structure Granulation Quality: Red, Pink Fascia Exposed: No Necrotic Amount: Small (1-33%) Fat Layer (Subcutaneous Tissue) Exposed: Yes Necrotic Quality: Adherent Slough Tendon Exposed: No Muscle Exposed: No Joint Exposed: No Bone Exposed: No Periwound Skin Texture Mcginty, Noelly ([id number].pdf  Page 5 of 9 Texture Color No Abnormalities Noted: No No Abnormalities Noted: No Callus: Yes Atrophie Blanche: No Crepitus: No Cyanosis: No Excoriation: No Ecchymosis:  No Induration: No Erythema: No Rash: No Hemosiderin Staining: No Scarring: No Mottled: No Pallor: No Moisture Rubor: No No Abnormalities Noted: No Dry / Scaly: No Temperature / Pain Maceration: Yes Temperature: No Abnormality Tenderness on Palpation: Yes Treatment Notes Wound #1 (Calcaneus) Wound Laterality: Right Cleanser Soap and Water Discharge Instruction: May shower and wash wound with dial antibacterial soap and water prior to dressing change. Wound Cleanser Discharge Instruction: Cleanse the wound with wound cleanser prior to applying a clean dressing using gauze sponges, not tissue or cotton balls. Peri-Wound Care Sween Lotion (Moisturizing lotion) Discharge Instruction: Apply moisturizing lotion as directed Topical Primary Dressing Sorbalgon AG Dressing 2x2 (in/in) Discharge Instruction: Apply to wound bed as instructed Secondary Dressing ABD Pad, 5x9 Discharge Instruction: Apply over primary dressing as directed. Woven Gauze Sponge, Non-Sterile 4x4 in Discharge Instruction: Apply over primary dressing as directed. heel ortho felt Discharge Instruction: apply in shoe to aid in offloading of heel and wound. Secured With Conforming Stretch Gauze Bandage Roll, Sterile 4x75 (in/in) Discharge Instruction: Secure with stretch gauze as directed. 24M Medipore H Soft Cloth Surgical T ape, 4 x 10 (in/yd) Discharge Instruction: Secure with tape as directed. Compression Wrap Compression Stockings Add-Ons Electronic Signature(s) Signed: 12/27/2022 11:12:19 AM By: Rhae Hammock RN Entered By: Rhae Hammock on 12/25/2022 09:58:14 -------------------------------------------------------------------------------- Wound Assessment Details Patient Name: Date of Service: MAKIAH, MCALOON 12/25/2022 9:30 A M Medical Record Number: [id number] Patient Account Number: [id number] Date of Birth/Sex: Treating RN: [birth year] (57 y.o. [name], [name] Primary Care Calley Drenning:  Stoney Bang Other Clinician: Referring Caelin Rayl: Treating Brooke Steinhilber/Extender: Matt Holmes Weeks in Treatment: 28 Wound Status Wound Number: 4R Primary Etiology: Diabetic Wound/Ulcer of the Lower Extremity Wound Location: Right, Distal, Plantar Foot Wound Status: Open Casselman, Astryd ([id number] Page 6 of 9 Wounding Event: Blister Comorbid History: Hypertension, Type II Diabetes, Osteomyelitis Date Acquired: 11/08/2020 Weeks Of Treatment: 10 Clustered Wound: Yes Photos Wound Measurements Length: (cm) Width: (cm) Depth: (cm) Clustered Quantity: Area: (cm) Volume: (cm) 0.1 % Reduction in Area: 99.7% 0.1 % Reduction in Volume: 99.8% 0.1 Epithelialization: Medium (34-66%) 2 0.008 0.001 Wound Description Classification: Grade 3 Wound Margin: Distinct, outline attached Exudate Amount: Medium Exudate Type: Serosanguineous Exudate Color: red, brown Foul Odor After Cleansing: No Slough/Fibrino No Wound Bed Granulation Amount: Large (67-100%) Exposed Structure Granulation Quality: Red Fascia Exposed: No Necrotic Amount: None Present (0%) Fat Layer (Subcutaneous Tissue) Exposed: Yes Tendon Exposed: No Muscle Exposed: No Joint Exposed: No Bone Exposed: No Periwound Skin Texture Texture Color No Abnormalities Noted: No No Abnormalities Noted: No Callus: Yes Atrophie Blanche: No Crepitus: No Cyanosis: No Excoriation: No Ecchymosis: No Induration: No Erythema: No Rash: No Hemosiderin Staining: No Scarring: No Mottled: No Pallor: No Moisture Rubor: No No Abnormalities Noted: No Dry / Scaly: No Maceration: No Treatment Notes Wound #4R (Foot) Wound Laterality: Plantar, Right, Distal Cleanser Soap and Water Discharge Instruction: May shower and wash wound with dial antibacterial soap and water prior to dressing change. Wound Cleanser Discharge Instruction: Cleanse the wound with wound cleanser prior to  applying a clean dressing using gauze sponges, not tissue or cotton balls. Peri-Wound Care Sween Lotion (Moisturizing lotion) Discharge Instruction: Apply moisturizing lotion as directed Topical Perl, Gracelynn ([id number] Page 7 of 9 Primary Dressing Sorbalgon AG Dressing 2x2 (in/in) Discharge Instruction: Apply to wound bed as instructed  Secondary Dressing ABD Pad, 5x9 Discharge Instruction: Apply over primary dressing as directed. Woven Gauze Sponge, Non-Sterile 4x4 in Discharge Instruction: Apply over primary dressing as directed. heel ortho felt Discharge Instruction: apply in shoe to aid in offloading of heel and wound. Secured With Conforming Stretch Gauze Bandage Roll, Sterile 4x75 (in/in) Discharge Instruction: Secure with stretch gauze as directed. 36M Medipore H Soft Cloth Surgical T ape, 4 x 10 (in/yd) Discharge Instruction: Secure with tape as directed. Compression Wrap Compression Stockings Add-Ons Electronic Signature(s) Signed: 12/27/2022 11:12:19 AM By: Rhae Hammock RN Entered By: Rhae Hammock on 12/25/2022 09:58:34 -------------------------------------------------------------------------------- Wound Assessment Details Patient Name: Date of Service: VIVIEN, VILCHIS 12/25/2022 9:30 A M Medical Record Number: [id number] Patient Account Number: [id number] Date of Birth/Sex: Treating RN: 1965-12-24 (57 y.o. [name], [name] Primary Care Rebeccah Ivins: Stoney Bang Other Clinician: Referring Korbyn Chopin: Treating Tyniesha Howald/Extender: [place] Breeding in Treatment: 28 Wound Status Wound Number: 5 Primary Etiology: Pressure Ulcer Wound Location: Right, Lateral Lower Leg Wound Status: Open Wounding Event: Gradually Appeared Comorbid History: Hypertension, Type II Diabetes, Osteomyelitis Date Acquired: 07/07/2022 Weeks Of Treatment: 0 Clustered Wound: No Photos Wound Measurements Length: (cm) 1.4 Width:  (cm) 1.4 Depth: (cm) 0.1 Area: (cm) 1.539 Volume: (cm) 0.154 % Reduction in Area: % Reduction in Volume: Epithelialization: None Tunneling: No Undermining: No Wound Description Classification: Unstageable/Unclassified Wound Margin: Distinct, outline attached Exudate Amount: Medium Buntin, Devlin ([id number]) Exudate Type: Serosanguineous Exudate Color: red, brown Foul Odor After Cleansing: No Slough/Fibrino Yes [id number] Page 8 of 9 Wound Bed Granulation Amount: None Present (0%) Exposed Structure Necrotic Amount: Large (67-100%) Fascia Exposed: No Necrotic Quality: Eschar Fat Layer (Subcutaneous Tissue) Exposed: No Tendon Exposed: No Muscle Exposed: No Joint Exposed: No Bone Exposed: No Periwound Skin Texture Texture Color No Abnormalities Noted: No No Abnormalities Noted: No Callus: No Atrophie Blanche: No Crepitus: No Cyanosis: No Excoriation: No Ecchymosis: No Induration: No Erythema: Yes Rash: No Erythema Location: Circumferential Scarring: No Hemosiderin Staining: No Mottled: No Moisture Pallor: No No Abnormalities Noted: No Rubor: No Dry / Scaly: Yes Maceration: No Temperature / Pain Temperature: Hot Tenderness on Palpation: Yes Treatment Notes Wound #5 (Lower Leg) Wound Laterality: Right, Lateral Cleanser Soap and Water Discharge Instruction: May shower and wash wound with dial antibacterial soap and water prior to dressing change. Wound Cleanser Discharge Instruction: Cleanse the wound with wound cleanser prior to applying a clean dressing using gauze sponges, not tissue or cotton balls. Peri-Wound Care Sween Lotion (Moisturizing lotion) Discharge Instruction: Apply moisturizing lotion as directed Topical Primary Dressing Sorbalgon AG Dressing 2x2 (in/in) Discharge Instruction: Apply to wound bed as instructed Secondary Dressing Zetuvit Plus Silicone Border Dressing 4x4 (in/in) Discharge Instruction: Apply  silicone border over primary dressing as directed. Secured With Compression Wrap Compression Stockings Environmental education officer) Signed: 12/27/2022 11:12:19 AM By: Rhae Hammock RN Entered By: Rhae Hammock on 12/25/2022 09:59:00 -------------------------------------------------------------------------------- Vitals Details Patient Name: Date of Service: KARALINE, WIEBKE 12/25/2022 9:30 A M Medical Record Number: [id number] Patient Account Number: [id number] Date of Birth/Sex: Treating RN: [birth year] (57 y.o. [name], [name] Primary Care Selim Durden: Stoney Bang Other Clinician: Referring Jameila Keeny: Treating Tilley Faeth/Extender: Matt Holmes [place], [place] ([id number]) [id number] Page 9 of 9 Weeks in Treatment: 28 Vital Signs Time Taken: 09:42 Temperature (F): 98.3 Height (in): 65 Pulse (bpm): 102 Weight (lbs): 331 Respiratory Rate (breaths/min): 17 Body Mass Index (BMI): 55.1 Blood Pressure (mmHg): 136/85 Reference Range: 80 - 120 mg / dl Electronic Signature(s) Signed: 12/27/2022 11:12:19 AM By: Hollie Salk,  [name] RN Entered By: Rhae Hammock on 12/25/2022 09:42:40

## 2023-01-01 ENCOUNTER — Encounter (HOSPITAL_BASED_OUTPATIENT_CLINIC_OR_DEPARTMENT_OTHER): Payer: No Typology Code available for payment source | Admitting: Physician Assistant

## 2023-01-01 DIAGNOSIS — E11621 Type 2 diabetes mellitus with foot ulcer: Secondary | ICD-10-CM | POA: Diagnosis not present

## 2023-01-01 NOTE — Progress Notes (Signed)
Pegg, [name] ([id number].pdf Page 1 of 5 Visit Report for 01/01/2023 Chief Complaint Document Details Patient Name: Date of Service: [name], [name] 01/01/2023 9:45 A M Medical Record Number: [id number] Patient Account Number: [id number] Date of Birth/Sex: Treating RN: 06/22/1966 (57 y.o. F) Primary Care Provider: Stoney Bang Other Clinician: Referring Provider: Treating Provider/Extender: Dallas Breeding in Treatment: 29 Information Obtained from: Patient Chief Complaint Right heel ulcer Electronic Signature(s) Signed: 01/01/2023 9:53:17 AM By: Worthy Keeler PA-C Entered By: Worthy Keeler on 01/01/2023 09:53:17 -------------------------------------------------------------------------------- Debridement Details Patient Name: Date of Service: [name], [name] 01/01/2023 9:45 A M Medical Record Number: [id number] Patient Account Number: [id number] Date of Birth/Sex: Treating RN: 15-Apr-1966 (57 y.o. Helene Shoe, Meta.Reding Primary Care Provider: Stoney Bang Other Clinician: Referring Provider: Treating Provider/Extender: Dallas Breeding in Treatment: 29 Debridement Performed for Assessment: Wound #4R Right,Distal,Plantar Foot Performed By: Physician Worthy Keeler, PA Debridement Type: Debridement Severity of Tissue Pre Debridement: Fat layer exposed Level of Consciousness (Pre-procedure): Awake and Alert Pre-procedure Verification/Time Out Yes - 10:05 Taken: Start Time: 10:06 Pain Control: Lidocaine 4% T opical Solution T Area Debrided (L x W): otal 1 (cm) x 1 (cm) = 1 (cm) Tissue and other material debrided: Viable, Non-Viable, Callus, Subcutaneous, Skin: Dermis , Skin: Epidermis Level: Skin/Subcutaneous Tissue Debridement Description: Excisional Instrument: Curette Bleeding: Minimum Hemostasis Achieved: Pressure End Time: 10:12 Procedural Pain: 0 Post Procedural Pain: 0 Response to Treatment: Procedure  was tolerated well Level of Consciousness (Post- Awake and Alert procedure): Post Debridement Measurements of Total Wound Length: (cm) 0.5 Width: (cm) 0.1 Depth: (cm) 0.2 Volume: (cm) 0.008 Character of Wound/Ulcer Post Debridement: Improved Severity of Tissue Post Debridement: Fat layer exposed Post Procedure Diagnosis Same as Pre-procedure Electronic Signature(s) Unsigned Entered By: Deon Pilling on 01/01/2023 10:12:37 Bowerman, Shacara ([id number].pdf Page 2 of 5 -------------------------------------------------------------------------------- Debridement Details Patient Name: Date of Service: [name], [name] 01/01/2023 9:45 A M Medical Record Number: [id number] Patient Account Number: [id number] Date of Birth/Sex: Treating RN: 04/04/1966 (57 y.o. Helene Shoe, Meta.Reding Primary Care Provider: Stoney Bang Other Clinician: Referring Provider: Treating Provider/Extender: Dallas Breeding in Treatment: 29 Debridement Performed for Assessment: Wound #1 Right Calcaneus Performed By: Physician Worthy Keeler, PA Debridement Type: Debridement Severity of Tissue Pre Debridement: Fat layer exposed Level of Consciousness (Pre-procedure): Awake and Alert Pre-procedure Verification/Time Out Yes - 10:05 Taken: Start Time: 10:06 Pain Control: Lidocaine 4% T opical Solution T Area Debrided (L x W): otal 1.5 (cm) x 1 (cm) = 1.5 (cm) Tissue and other material debrided: Viable, Non-Viable, Callus, Subcutaneous, Skin: Dermis , Skin: Epidermis Level: Skin/Subcutaneous Tissue Debridement Description: Excisional Instrument: Curette Bleeding: Minimum Hemostasis Achieved: Pressure End Time: 10:12 Procedural Pain: 0 Post Procedural Pain: 0 Response to Treatment: Procedure was tolerated well Level of Consciousness (Post- Awake and Alert procedure): Post Debridement Measurements of Total Wound Length: (cm) 0.9 Width: (cm) 0.5 Depth: (cm)  0.3 Volume: (cm) 0.106 Character of Wound/Ulcer Post Debridement: Improved Severity of Tissue Post Debridement: Fat layer exposed Post Procedure Diagnosis Same as Pre-procedure Electronic Signature(s) Unsigned Entered By: Deon Pilling on 01/01/2023 10:17:08 -------------------------------------------------------------------------------- Physician Orders Details Patient Name: Date of Service: [name], [name] 01/01/2023 9:45 A M Medical Record Number: [id number] Patient Account Number: [id number] Date of Birth/Sex: Treating RN: 1965/12/10 (56 y.o. 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 Margarita Grizzle, [place] Wednesday 0930 01/08/2023 room 9 Return A Anesthetic (In clinic) Topical Lidocaine 5% applied to wound bed Bathing/ Shower/ Hygiene May shower with protection but do not get wound dressing(s) wet. Protect dressing(s) with water repellant cover (for example, large plastic bag) or a cast cover and may then take shower. Edema Control - Lymphedema / SCD / Other Elevate legs to the level of the heart or above for 30 minutes daily and/or when sitting for 3-4 times a day throughout the day. Avoid standing for long periods of time. Off-Loading Other: - Minimize walking and standing as much as possible to right foot to aid in offloading to wound. While at work use the wheelchair for mobility. felt in shoe to aid in offloading wound and  heel. Wound Treatment Wound #1 - Calcaneus Wound Laterality: Right Cleanser: Soap and Water 1 x Per Day/30 Days Discharge Instructions: May shower and wash wound with dial antibacterial soap and water prior to dressing change. Cleanser: Wound Cleanser (Generic) 1 x Per Day/30 Days Discharge Instructions: Cleanse the wound with wound cleanser prior to applying a clean dressing using gauze sponges, not tissue or cotton balls. Peri-Wound Care: Sween Lotion (Moisturizing lotion) 1 x Per Day/30 Days Discharge Instructions: Apply moisturizing lotion as directed Prim Dressing: Sorbalgon AG Dressing 2x2 (in/in) (Generic) 1 x Per Day/30 Days ary Discharge Instructions: Apply to wound bed as instructed Secondary Dressing: ABD Pad, 5x9 (Generic) 1 x Per Day/30 Days Discharge Instructions: Apply over primary dressing as directed. Secondary Dressing: Woven Gauze Sponge, Non-Sterile 4x4 in (Generic) 1 x Per Day/30 Days Discharge Instructions: Apply over primary dressing as directed. Secondary Dressing: heel ortho felt 1 x Per Day/30 Days Discharge Instructions: apply in shoe to aid in offloading of heel and wound. Secured With: Scientist, forensic, Sterile 4x75 (in/in) (Generic) 1 x Per Day/30 Days Discharge Instructions: Secure with stretch gauze as directed. Secured With: 106M Medipore H Soft Cloth Surgical T ape, 4 x 10 (in/yd) (Generic) 1 x Per Day/30 Days Discharge Instructions: Secure with tape as directed. Wound #4R - Foot Wound Laterality: Plantar, Right, Distal Cleanser: Soap and Water 1 x Per Day/30 Days Discharge Instructions: May shower and wash wound with dial antibacterial soap and water prior to dressing change. Cleanser: Wound Cleanser (Generic) 1 x Per Day/30 Days Discharge Instructions: Cleanse the wound with wound cleanser prior to applying a clean dressing using gauze sponges, not tissue or cotton balls. Peri-Wound Care: Sween Lotion (Moisturizing lotion) 1 x Per  Day/30 Days Discharge Instructions: Apply moisturizing lotion as directed Prim Dressing: Sorbalgon AG Dressing 2x2 (in/in) (Generic) 1 x Per Day/30 Days ary Discharge Instructions: Apply to wound bed as instructed Secondary Dressing: ABD Pad, 5x9 (Generic) 1 x Per Day/30 Days Discharge Instructions: Apply over primary dressing as directed. Secondary Dressing: Woven Gauze Sponge, Non-Sterile 4x4 in (Generic) 1 x Per Day/30 Days Discharge Instructions: Apply over primary dressing as directed. Secondary Dressing: heel ortho felt 1 x Per Day/30 Days Discharge Instructions: apply in shoe to aid in offloading of heel and wound. Secured With: Scientist, forensic, Sterile 4x75 (in/in) (Generic) 1 x Per Day/30 Days Discharge Instructions: Secure with stretch gauze as directed.  Secured With: 84M Medipore H Soft Cloth Surgical T ape, 4 x 10 (in/yd) (Generic) 1 x Per Day/30 Days Discharge Instructions: Secure with tape as directed. [name], [name] ([id number].pdf Page 4 of 5 Wound #5 - Lower Leg Wound Laterality: Right, Lateral Cleanser: Soap and Water 1 x Per Day/30 Days Discharge Instructions: May shower and wash wound with dial antibacterial soap and water prior to dressing change. Cleanser: Wound Cleanser (Generic) 1 x Per Day/30 Days Discharge Instructions: Cleanse the wound with wound cleanser prior to applying a clean dressing using gauze sponges, not tissue or cotton balls. Peri-Wound Care: Sween Lotion (Moisturizing lotion) 1 x Per Day/30 Days Discharge Instructions: Apply moisturizing lotion as directed Prim Dressing: Sorbalgon AG Dressing 2x2 (in/in) (Generic) 1 x Per Day/30 Days ary Discharge Instructions: Apply to wound bed as instructed Secondary Dressing: Zetuvit Plus Silicone Border Dressing 4x4 (in/in) 1 x Per Day/30 Days Discharge Instructions: Apply silicone border over primary dressing as directed. Electronic  Signature(s) Unsigned Entered By: Deon Pilling on 01/01/2023 10:12:55 -------------------------------------------------------------------------------- Problem List Details Patient Name: Date of Service: [name], [name] 01/01/2023 9:45 A M Medical Record Number: [id number] Patient Account Number: [id number] Date of Birth/Sex: Treating RN: 04/10/1966 (57 y.o. 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

## 2023-01-02 NOTE — Progress Notes (Signed)
Hedberg, [name] ([id number].pdf Page 1 of 9 Visit Report for 01/01/2023 Arrival Information Details Patient Name: Date of Service: [name], [name] 01/01/2023 9:45 A M Medical Record Number: [id number] Patient Account Number: [id number] Date of Birth/Sex: Treating RN: 1965/11/07 (57 y.o. [name] Primary Care Felis Quillin: Stoney Bang Other Clinician: Referring Rilynne Lonsway: Treating Amous Crewe/Extender: [place] Breeding in Treatment: 29 Visit Information History Since Last Visit Added or deleted any medications: No Patient Arrived: Ambulatory Any new allergies or adverse reactions: No Arrival Time: 09:49 Had a fall or experienced change in No Accompanied By: son activities of daily living that may affect Transfer Assistance: None risk of falls: Patient Identification Verified: Yes Signs or symptoms of abuse/neglect since last visito No Secondary Verification Process Completed: Yes Hospitalized since last visit: No Patient Requires Transmission-Based Precautions: No Implantable device outside of the clinic excluding No Patient Has Alerts: Yes cellular tissue based products placed in the center Patient Alerts: PICC in left arm since last visit: Has Dressing in Place as Prescribed: Yes Pain Present Now: Yes Electronic Signature(s) Signed: 01/01/2023 5:29:18 PM By: Deon Pilling RN, BSN Entered By: Deon Pilling on 01/01/2023 09:50:46 -------------------------------------------------------------------------------- Encounter Discharge Information Details Patient Name: Date of Service: [name], [name] 01/01/2023 9:45 A M Medical Record Number: [id number] Patient Account Number: [id number] Date of Birth/Sex: Treating RN: 12/24/1965 (57 y.o. [name] Primary Care Holiday Mcmenamin: Stoney Bang Other Clinician: Referring Lashuna Tamashiro: Treating Kaneisha Ellenberger/Extender: [place] Breeding in Treatment: 29 Encounter Discharge Information  Items Post Procedure Vitals Discharge Condition: Stable Temperature (F): 98.4 Ambulatory Status: Ambulatory Pulse (bpm): 102 Discharge Destination: Home Respiratory Rate (breaths/min): 20 Transportation: Private Auto Blood Pressure (mmHg): 133/85 Accompanied By: son Schedule Follow-up Appointment: Yes Clinical Summary of Care: Electronic Signature(s) Signed: 01/01/2023 5:29:18 PM By: Deon Pilling RN, BSN Entered By: Deon Pilling on 01/01/2023 10:13:46 -------------------------------------------------------------------------------- Lower Extremity Assessment Details Patient Name: Date of Service: [name], [name] 01/01/2023 9:45 A M Medical Record Number: [id number] Patient Account Number: [id number] Date of Birth/Sex: Treating RN: 10-Nov-1965 (57 y.o. [name] Primary Care Kaeleb Emond: Stoney Bang Other Clinician: Referring Shihab States: Treating Bracen Schum/Extender: Matt Holmes Weeks in Treatment: 29 Edema Assessment Assessed: Shirlyn Goltz: No] Patrice Paradise: Yes] E[LeftLurlean Leyden, [name] [id number] [[id number].pdf Page 2 of 9] Edema: [Left: Ye] [Right: s] Calf Left: Right: Point of Measurement: 32 cm From Medial Instep 57 cm Ankle Left: Right: Point of Measurement: 10 cm From Medial Instep 32 cm Vascular Assessment Pulses: Dorsalis Pedis Palpable: [Right:Yes] Electronic Signature(s) Signed: 01/01/2023 5:29:18 PM By: Deon Pilling RN, BSN Entered By: Deon Pilling on 01/01/2023 09:52:34 -------------------------------------------------------------------------------- [place] Details Patient Name: Date of Service: [name], [name] 01/01/2023 9:45 A M Medical Record Number: [id number] Patient Account Number: [id number] Date of Birth/Sex: Treating RN: 1966-05-17 (57 y.o. [name] Primary Care Sanyah Molnar: Stoney Bang Other Clinician: Referring Hunt Zajicek: Treating Millee Denise/Extender: [place] Breeding in  Treatment: 29 Active Inactive Wound/Skin Impairment Nursing Diagnoses: Impaired tissue integrity Knowledge deficit related to ulceration/compromised skin integrity Goals: Patient will have a decrease in wound volume by X% from date: (specify in notes) Date Initiated: 06/12/2022 Target Resolution Date: 02/07/2023 Goal Status: Active Patient/caregiver will verbalize understanding of skin care regimen Date Initiated: 06/12/2022 Target Resolution Date: 02/07/2023 Goal Status: Active Ulcer/skin breakdown will have a volume reduction of 30% by week 4 Date Initiated: 06/12/2022 Date Inactivated: 07/31/2022 Target Resolution Date: 08/03/2022 Goal Status: Unmet Unmet Reason: dx with osteomyelitis. Ulcer/skin breakdown will have a volume reduction of  50% by week 8 Date Initiated: 06/12/2022 Date Inactivated: 10/16/2022 Target Resolution Date: 10/05/2022 Unmet Reason: see wound Goal Status: Unmet measurement. IV antibiotics Interventions: Assess patient/caregiver ability to obtain necessary supplies Assess patient/caregiver ability to perform ulcer/skin care regimen upon admission and as needed Assess ulceration(s) every visit Notes: Electronic Signature(s) Signed: 01/01/2023 5:29:18 PM By: Deon Pilling RN, BSN Entered By: Deon Pilling on 01/01/2023 10:08:26 Pain Assessment Details -------------------------------------------------------------------------------- [name] ([id number].pdf Page 3 of 9 Patient Name: Date of Service: [name], [name] 01/01/2023 9:45 A M Medical Record Number: [id number] Patient Account Number: [id number] Date of Birth/Sex: Treating RN: 08/27/1966 (57 y.o. [name] Primary Care Elke Holtry: Stoney Bang Other Clinician: Referring Jeremie Abdelaziz: Treating Tekoa Hamor/Extender: Dallas Breeding in Treatment: 29 Active Problems Location of Pain Severity and Description of Pain Patient Has Paino Yes Site Locations Pain  Location: Pain in Ulcers Rate the pain. Current Pain Level: 8 Pain Management and Medication Current Pain Management: Medication: No Cold Application: No Rest: No Massage: No Activity: No T.E.N.S.: No Heat Application: No Leg drop or elevation: No Is the Current Pain Management Adequate: Adequate How does your wound impact your activities of daily livingo Sleep: No Bathing: No Appetite: No Relationship With Others: No Bladder Continence: No Emotions: No Bowel Continence: No Work: No Toileting: No Drive: No Dressing: No Hobbies: No Engineer, maintenance) Signed: 01/01/2023 5:29:18 PM By: Deon Pilling RN, BSN Entered By: Deon Pilling on 01/01/2023 09:51:07 -------------------------------------------------------------------------------- Patient/Caregiver Education Details Patient Name: Date of Service: [name] 3/27/2024andnbsp9:45 [place] Record Number: [id number] Patient Account Number: [id number] Date of Birth/Gender: Treating RN: [birth year] (57 y.o. [name] Primary Care Physician: Stoney Bang Other Clinician: Referring Physician: Treating Physician/Extender: Dallas Breeding in Treatment: 13 Education Assessment Education Provided To: Patient Education Topics Provided Wound/Skin Impairment: Handouts: Caring for Your Ulcer Methods: Explain/Verbal Responses: Reinforcements needed [name], [name] ([id number].pdf Page 4 of 9 Electronic Signature(s) Signed: 01/01/2023 5:29:18 PM By: Deon Pilling RN, BSN Entered By: Deon Pilling on 01/01/2023 10:08:38 -------------------------------------------------------------------------------- Wound Assessment Details Patient Name: Date of Service: [name], [name] 01/01/2023 9:45 A M Medical Record Number: [id number] Patient Account Number: [id number] Date of Birth/Sex: Treating RN: 27-Nov-1965 (57 y.o. Helene Shoe, Meta.Reding Primary Care Kaniya Trueheart: Stoney Bang Other  Clinician: Referring Teisha Trowbridge: Treating Willamae Demby/Extender: Dallas Breeding in Treatment: 29 Wound Status Wound Number: 1 Primary Etiology: Diabetic Wound/Ulcer of the Lower Extremity Wound Location: Right Calcaneus Wound Status: Open Wounding Event: Gradually Appeared Comorbid History: Hypertension, Type II Diabetes, Osteomyelitis Date Acquired: 11/07/2020 Weeks Of Treatment: 29 Clustered Wound: Yes Photos Wound Measurements Length: (cm) Width: (cm) Depth: (cm) Clustered Quantity: Area: (cm) Volume: (cm) 0.9 % Reduction in Area: 97% 0.5 % Reduction in Volume: 95.4% 0.3 Epithelialization: Medium (34-66%) 2 Tunneling: No 0.353 Undermining: No 0.106 Wound Description Classification: Grade 3 Wound Margin: Thickened Exudate Amount: Medium Exudate Type: Serosanguineous Exudate Color: red, brown Foul Odor After Cleansing: No Slough/Fibrino Yes Wound Bed Granulation Amount: Large (67-100%) Exposed Structure Granulation Quality: Red, Pink Fascia Exposed: No Necrotic Amount: Small (1-33%) Fat Layer (Subcutaneous Tissue) Exposed: Yes Necrotic Quality: Adherent Slough Tendon Exposed: No Muscle Exposed: No Joint Exposed: No Bone Exposed: No Periwound Skin Texture Texture Color No Abnormalities Noted: No No Abnormalities Noted: No Callus: Yes Atrophie Blanche: No Crepitus: No Cyanosis: No Excoriation: No Ecchymosis: No Induration: No Erythema: No Rash: No Hemosiderin Staining: No Scarring: No Mottled: No Pallor: No Winstead, Oleda ([id number].pdf Page 5 of 9 Pallor:  No Moisture Rubor: No No Abnormalities Noted: No Dry / Scaly: Yes Temperature / Pain Maceration: No Temperature: No Abnormality Tenderness on Palpation: Yes Treatment Notes Wound #1 (Calcaneus) Wound Laterality: Right Cleanser Soap and Water Discharge Instruction: May shower and wash wound with dial antibacterial soap and water prior to  dressing change. Wound Cleanser Discharge Instruction: Cleanse the wound with wound cleanser prior to applying a clean dressing using gauze sponges, not tissue or cotton balls. Peri-Wound Care Sween Lotion (Moisturizing lotion) Discharge Instruction: Apply moisturizing lotion as directed Topical Primary Dressing Sorbalgon AG Dressing 2x2 (in/in) Discharge Instruction: Apply to wound bed as instructed Secondary Dressing ABD Pad, 5x9 Discharge Instruction: Apply over primary dressing as directed. Woven Gauze Sponge, Non-Sterile 4x4 in Discharge Instruction: Apply over primary dressing as directed. heel ortho felt Discharge Instruction: apply in shoe to aid in offloading of heel and wound. Secured With Conforming Stretch Gauze Bandage Roll, Sterile 4x75 (in/in) Discharge Instruction: Secure with stretch gauze as directed. 68M Medipore H Soft Cloth Surgical T ape, 4 x 10 (in/yd) Discharge Instruction: Secure with tape as directed. Compression Wrap Compression Stockings Add-Ons Electronic Signature(s) Signed: 01/01/2023 5:29:18 PM By: Deon Pilling RN, BSN Entered By: Deon Pilling on 01/01/2023 09:59:36 -------------------------------------------------------------------------------- Wound Assessment Details Patient Name: Date of Service: [name], [name] 01/01/2023 9:45 A M Medical Record Number: [id number] Patient Account Number: [id number] Date of Birth/Sex: Treating RN: June 28, 1966 (57 y.o. [name] Primary Care Jovanne Riggenbach: Stoney Bang Other Clinician: Referring Kayelyn Lemon: Treating Darline Faith/Extender: Matt Holmes Weeks in Treatment: 29 Wound Status Wound Number: 4R Primary Etiology: Diabetic Wound/Ulcer of the Lower Extremity Wound Location: Right, Distal, Plantar Foot Wound Status: Open Wounding Event: Blister Comorbid History: Hypertension, Type II Diabetes, Osteomyelitis Date Acquired: 11/08/2020 Weeks Of Treatment: 11 Clustered Wound:  Yes Photos [name], [name] ([id number].pdf Page 6 of 9 Wound Measurements Length: (cm) Width: (cm) Depth: (cm) Clustered Quantity: Area: (cm) Volume: (cm) 0.5 % Reduction in Area: 98.4% 0.1 % Reduction in Volume: 98.4% 0.2 Epithelialization: Large (67-100%) 1 Tunneling: No 0.039 Undermining: No 0.008 Wound Description Classification: Grade 3 Wound Margin: Distinct, outline attached Exudate Amount: Medium Exudate Type: Serosanguineous Exudate Color: red, brown Foul Odor After Cleansing: No Slough/Fibrino No Wound Bed Granulation Amount: Large (67-100%) Exposed Structure Granulation Quality: Red Fascia Exposed: No Necrotic Amount: None Present (0%) Fat Layer (Subcutaneous Tissue) Exposed: Yes Tendon Exposed: No Muscle Exposed: No Joint Exposed: No Bone Exposed: No Periwound Skin Texture Texture Color No Abnormalities Noted: No No Abnormalities Noted: No Callus: Yes Atrophie Blanche: No Crepitus: No Cyanosis: No Excoriation: No Ecchymosis: No Induration: No Erythema: No Rash: No Hemosiderin Staining: No Scarring: No Mottled: No Pallor: No Moisture Rubor: No No Abnormalities Noted: No Dry / Scaly: Yes Maceration: No Treatment Notes Wound #4R (Foot) Wound Laterality: Plantar, Right, Distal Cleanser Soap and Water Discharge Instruction: May shower and wash wound with dial antibacterial soap and water prior to dressing change. Wound Cleanser Discharge Instruction: Cleanse the wound with wound cleanser prior to applying a clean dressing using gauze sponges, not tissue or cotton balls. Peri-Wound Care Sween Lotion (Moisturizing lotion) Discharge Instruction: Apply moisturizing lotion as directed Topical Primary Dressing Sorbalgon AG Dressing 2x2 (in/in) Discharge Instruction: Apply to wound bed as instructed Secondary Dressing ABD Pad, 5x9 Kawabata, Shaqueta ([id number].pdf Page 7 of  9 Discharge Instruction: Apply over primary dressing as directed. Woven Gauze Sponge, Non-Sterile 4x4 in Discharge Instruction: Apply over primary dressing as directed. heel ortho felt Discharge Instruction: apply in shoe to  aid in offloading of heel and wound. Secured With Conforming Stretch Gauze Bandage Roll, Sterile 4x75 (in/in) Discharge Instruction: Secure with stretch gauze as directed. 48M Medipore H Soft Cloth Surgical T ape, 4 x 10 (in/yd) Discharge Instruction: Secure with tape as directed. Compression Wrap Compression Stockings Add-Ons Electronic Signature(s) Signed: 01/01/2023 5:29:18 PM By: Deon Pilling RN, BSN Entered By: Deon Pilling on 01/01/2023 09:59:03 -------------------------------------------------------------------------------- Wound Assessment Details Patient Name: Date of Service: [name], [name] 01/01/2023 9:45 A M Medical Record Number: [id number] Patient Account Number: [id number] Date of Birth/Sex: Treating RN: 1966-08-23 (57 y.o. Helene Shoe, Meta.Reding Primary Care Zamiya Dillard: Stoney Bang Other Clinician: Referring Jakyren Fluegge: Treating Olevia Westervelt/Extender: [place] Breeding in Treatment: 29 Wound Status Wound Number: 5 Primary Etiology: Lymphedema Wound Location: Right, Lateral Lower Leg Secondary Etiology: Pressure Ulcer Wounding Event: Gradually Appeared Wound Status: Open Date Acquired: 07/07/2022 Comorbid History: Hypertension, Type II Diabetes, Osteomyelitis Weeks Of Treatment: 1 Clustered Wound: No Photos Wound Measurements Length: (cm) 1.7 Width: (cm) 2 Depth: (cm) 0.2 Area: (cm) 2.67 Volume: (cm) 0.534 % Reduction in Area: -73.5% % Reduction in Volume: -246.8% Epithelialization: Small (1-33%) Tunneling: No Undermining: No Wound Description Classification: Full Thickness Without Exposed Suppor Wound Margin: Distinct, outline attached Exudate Amount: Medium Exudate Type: Serosanguineous Exudate Color: red, brown t  Structures Foul Odor After Cleansing: No Slough/Fibrino Yes Wound Bed Granulation Amount: Medium (34-66%) Exposed Structure Granulation Quality: Red, Pink Fascia Exposed: No [name], [name] ([id number].pdf Page 8 of 9 Necrotic Amount: Medium (34-66%) Fat Layer (Subcutaneous Tissue) Exposed: Yes Necrotic Quality: Adherent Slough Tendon Exposed: No Muscle Exposed: No Joint Exposed: No Bone Exposed: No Periwound Skin Texture Texture Color No Abnormalities Noted: No No Abnormalities Noted: No Callus: No Atrophie Blanche: No Crepitus: No Cyanosis: No Excoriation: No Ecchymosis: No Induration: No Erythema: No Rash: No Hemosiderin Staining: Yes Scarring: No Mottled: No Pallor: No Moisture Rubor: No No Abnormalities Noted: No Dry / Scaly: No Temperature / Pain Maceration: No Temperature: Hot Tenderness on Palpation: Yes Treatment Notes Wound #5 (Lower Leg) Wound Laterality: Right, Lateral Cleanser Soap and Water Discharge Instruction: May shower and wash wound with dial antibacterial soap and water prior to dressing change. Wound Cleanser Discharge Instruction: Cleanse the wound with wound cleanser prior to applying a clean dressing using gauze sponges, not tissue or cotton balls. Peri-Wound Care Sween Lotion (Moisturizing lotion) Discharge Instruction: Apply moisturizing lotion as directed Topical Primary Dressing Sorbalgon AG Dressing 2x2 (in/in) Discharge Instruction: Apply to wound bed as instructed Secondary Dressing Zetuvit Plus Silicone Border Dressing 4x4 (in/in) Discharge Instruction: Apply silicone border over primary dressing as directed. Secured With Compression Wrap Compression Stockings Environmental education officer) Signed: 01/01/2023 5:29:18 PM By: Deon Pilling RN, BSN Entered By: Deon Pilling on 01/01/2023 10:07:56 -------------------------------------------------------------------------------- Vitals  Details Patient Name: Date of Service: GINETTA, WAYLAND 01/01/2023 9:45 A M Medical Record Number: [id number] Patient Account Number: [id number] Date of Birth/Sex: Treating RN: Oct 23, 1965 (57 y.o. [name] Primary Care Keiden Deskin: Stoney Bang Other Clinician: Referring Jolyn Deshmukh: Treating Jiraiya Mcewan/Extender: Dallas Breeding in Treatment: 29 Vital Signs Time Taken: 09:50 Temperature (F): 98.4 Height (in): 65 Pulse (bpm): 102 Weight (lbs): 331 Respiratory Rate (breaths/min): 20 Alter, Ajane ([id number]) [id number] Page 9 of 9 Body Mass Index (BMI): 55.1 Blood Pressure (mmHg): 133/85 Reference Range: 80 - 120 mg / dl Electronic Signature(s) Signed: 01/01/2023 5:29:18 PM By: Deon Pilling RN, BSN Entered By: Deon Pilling on 01/01/2023 10:13:42

## 2023-01-08 ENCOUNTER — Encounter (HOSPITAL_BASED_OUTPATIENT_CLINIC_OR_DEPARTMENT_OTHER): Payer: No Typology Code available for payment source | Attending: Physician Assistant | Admitting: Physician Assistant

## 2023-01-08 DIAGNOSIS — I890 Lymphedema, not elsewhere classified: Secondary | ICD-10-CM | POA: Diagnosis not present

## 2023-01-08 DIAGNOSIS — Z89429 Acquired absence of other toe(s), unspecified side: Secondary | ICD-10-CM | POA: Diagnosis not present

## 2023-01-08 DIAGNOSIS — M86671 Other chronic osteomyelitis, right ankle and foot: Secondary | ICD-10-CM | POA: Insufficient documentation

## 2023-01-08 DIAGNOSIS — E1151 Type 2 diabetes mellitus with diabetic peripheral angiopathy without gangrene: Secondary | ICD-10-CM | POA: Insufficient documentation

## 2023-01-08 DIAGNOSIS — I10 Essential (primary) hypertension: Secondary | ICD-10-CM | POA: Diagnosis not present

## 2023-01-08 DIAGNOSIS — E11621 Type 2 diabetes mellitus with foot ulcer: Secondary | ICD-10-CM | POA: Diagnosis present

## 2023-01-08 DIAGNOSIS — L97412 Non-pressure chronic ulcer of right heel and midfoot with fat layer exposed: Secondary | ICD-10-CM | POA: Insufficient documentation

## 2023-01-08 NOTE — Progress Notes (Signed)
Kauth, [name] ([id number].pdf Page 1 of 8 Visit Report for 01/08/2023 Arrival Information Details Patient Name: Date of Service: [name], [name] 01/08/2023 9:30 A M Medical Record Number: [id number] Patient Account Number: [id number] Date of Birth/Sex: Treating RN: [birth year] (57 y.o. F) Primary Care Kaiyana Bedore: Stoney Bang Other Clinician: Referring Shawntel Farnworth: Treating Nyema Hachey/Extender: [place] Breeding in Treatment: 30 Visit Information History Since Last Visit Added or deleted any medications: No Patient Arrived: Ambulatory Any new allergies or adverse reactions: No Arrival Time: 09:48 Had a fall or experienced change in No Accompanied By: son activities of daily living that may affect Transfer Assistance: None risk of falls: Patient Identification Verified: Yes Signs or symptoms of abuse/neglect since last visito No Secondary Verification Process Completed: Yes Hospitalized since last visit: No Patient Requires Transmission-Based Precautions: No Implantable device outside of the clinic excluding No Patient Has Alerts: Yes cellular tissue based products placed in the center Patient Alerts: PICC in left arm since last visit: Has Dressing in Place as Prescribed: Yes Pain Present Now: No Electronic Signature(s) Signed: 01/08/2023 4:50:23 PM By: Erenest Blank Entered By: Erenest Blank on 01/08/2023 09:49:18 -------------------------------------------------------------------------------- Encounter Discharge Information Details Patient Name: Date of Service: [name], [name] 01/08/2023 9:30 A M Medical Record Number: [id number] Patient Account Number: [id number] Date of Birth/Sex: Treating RN: [birth year] (57 y.o. Donalda Ewings Primary Care Avyonna Wagoner: Stoney Bang Other Clinician: Referring Nanako Stopher: Treating Marks Scalera/Extender: [place] Breeding in Treatment: 30 Encounter Discharge Information Items Post Procedure  Vitals Discharge Condition: Stable Temperature (F): 98.1 Ambulatory Status: Ambulatory Pulse (bpm): 98 Discharge Destination: Home Respiratory Rate (breaths/min): 18 Transportation: Private Auto Blood Pressure (mmHg): 121/86 Accompanied By: son Schedule Follow-up Appointment: Yes Clinical Summary of Care: Patient Declined Electronic Signature(s) Signed: 01/08/2023 3:49:12 PM By: Sharyn Creamer RN, BSN Entered By: Sharyn Creamer on 01/08/2023 11:05:29 -------------------------------------------------------------------------------- Lower Extremity Assessment Details Patient Name: Date of Service: [name] 01/08/2023 9:30 A M Medical Record Number: [id number] Patient Account Number: [id number] Date of Birth/Sex: Treating RN: Feb 22, 1966 (57 y.o. F) Primary Care Chealsey Miyamoto: Stoney Bang Other Clinician: Referring Jesiel Garate: Treating Junior Huezo/Extender: Matt Holmes Weeks in Treatment: 30 Edema Assessment Assessed: Shirlyn Goltz: No] [Right: No] E[LeftLurlean Leyden, Kyrielle [id number] [[id number] Page 2 of 8] Edema: [Left: Ye] [Right: s] Calf Left: Right: Point of Measurement: 32 cm From Medial Instep 54.5 cm Ankle Left: Right: Point of Measurement: 10 cm From Medial Instep 34.6 cm Electronic Signature(s) Signed: 01/08/2023 4:50:23 PM By: Erenest Blank Entered By: Erenest Blank on 01/08/2023 10:01:58 -------------------------------------------------------------------------------- Multi-Disciplinary Care Plan Details Patient Name: Date of Service: [name] 01/08/2023 9:30 A M Medical Record Number: [id number] Patient Account Number: [id number] Date of Birth/Sex: Treating RN: 28-Aug-1966 (57 y.o. Donalda Ewings Primary Care Jenavive Lamboy: Stoney Bang Other Clinician: Referring Khalani Novoa: Treating Lorea Kupfer/Extender: [place] Breeding in Treatment: 30 Active Inactive Wound/Skin Impairment Nursing Diagnoses: Impaired tissue  integrity Knowledge deficit related to ulceration/compromised skin integrity Goals: Patient will have a decrease in wound volume by X% from date: (specify in notes) Date Initiated: 06/12/2022 Target Resolution Date: 02/07/2023 Goal Status: Active Patient/caregiver will verbalize understanding of skin care regimen Date Initiated: 06/12/2022 Target Resolution Date: 02/07/2023 Goal Status: Active Ulcer/skin breakdown will have a volume reduction of 30% by week 4 Date Initiated: 06/12/2022 Date Inactivated: 07/31/2022 Target Resolution Date: 08/03/2022 Goal Status: Unmet Unmet Reason: dx with osteomyelitis. Ulcer/skin breakdown will have a volume reduction of 50% by week 8 Date Initiated: 06/12/2022 Date Inactivated: 10/16/2022 Target Resolution Date:  10/05/2022 Unmet Reason: see wound Goal Status: Unmet measurement. IV antibiotics Interventions: Assess patient/caregiver ability to obtain necessary supplies Assess patient/caregiver ability to perform ulcer/skin care regimen upon admission and as needed Assess ulceration(s) every visit Notes: Electronic Signature(s) Signed: 01/08/2023 3:49:12 PM By: Sharyn Creamer RN, BSN Entered By: Sharyn Creamer on 01/08/2023 10:20:08 -------------------------------------------------------------------------------- Pain Assessment Details Patient Name: Date of Service: [name], [name] 01/08/2023 9:30 A M Medical Record Number: [id number] Patient Account Number: [id number] Date of Birth/Sex: Treating RN: 06/22/1966 (57 y.o. F) Primary Care Brayden Brodhead: Stoney Bang Other Clinician: Referring Fraidy Mccarrick: Treating Romanita Fager/Extender: [place] Breeding in Treatment: [address] [place] ([id number]) [id number] Page 3 of 8 Active Problems Location of Pain Severity and Description of Pain Patient Has Paino No Site Locations Pain Management and Medication Current Pain Management: Electronic Signature(s) Signed: 01/08/2023 4:50:23 PM  By: Erenest Blank Entered By: Erenest Blank on 01/08/2023 09:51:15 -------------------------------------------------------------------------------- Patient/Caregiver Education Details Patient Name: Date of Service: Kawahara, Chrishana 4/3/2024andnbsp9:30 A M Medical Record Number: [id number] Patient Account Number: [id number] Date of Birth/Gender: Treating RN: [birth year] (57 y.o. Donalda Ewings Primary Care Physician: Stoney Bang Other Clinician: Referring Physician: Treating Physician/Extender: Dallas Breeding in Treatment: 30 Education Assessment Education Provided To: Patient Education Topics Provided Wound/Skin Impairment: Methods: Explain/Verbal Responses: State content correctly Electronic Signature(s) Signed: 01/08/2023 3:49:12 PM By: Sharyn Creamer RN, BSN Entered By: Sharyn Creamer on 01/08/2023 11:04:19 -------------------------------------------------------------------------------- Wound Assessment Details Patient Name: Date of Service: [name], [name] 01/08/2023 9:30 A M Medical Record Number: [id number] Patient Account Number: [id number] Date of Birth/Sex: Treating RN: Mar 29, 1966 (57 y.o. F) Primary Care Aisley Whan: Stoney Bang Other Clinician: Referring Shivon Hackel: Treating Seraphim Trow/Extender: Matt Holmes Weeks in Treatment: 28 Wound Status Maertens, Shakiyla ([id number].pdf Page 4 of 8 Wound Number: 1 Primary Etiology: Diabetic Wound/Ulcer of the Lower Extremity Wound Location: Right Calcaneus Wound Status: Open Wounding Event: Gradually Appeared Comorbid History: Hypertension, Type II Diabetes, Osteomyelitis Date Acquired: 11/07/2020 Weeks Of Treatment: 30 Clustered Wound: Yes Photos Wound Measurements Length: (cm) Width: (cm) Depth: (cm) Clustered Quantity: Area: (cm) Volume: (cm) 1 % Reduction in Area: 90.5% 1.4 % Reduction in Volume: 85.8% 0.3 Epithelialization: Medium (34-66%) 2 Tunneling:  No 1.1 Undermining: No 0.33 Wound Description Classification: Grade 3 Wound Margin: Thickened Exudate Amount: Medium Exudate Type: Serosanguineous Exudate Color: red, brown Foul Odor After Cleansing: No Slough/Fibrino Yes Wound Bed Granulation Amount: Large (67-100%) Exposed Structure Granulation Quality: Red, Pink Fascia Exposed: No Necrotic Amount: Small (1-33%) Fat Layer (Subcutaneous Tissue) Exposed: Yes Necrotic Quality: Adherent Slough Tendon Exposed: No Muscle Exposed: No Joint Exposed: No Bone Exposed: No Periwound Skin Texture Texture Color No Abnormalities Noted: No No Abnormalities Noted: No Callus: Yes Atrophie Blanche: No Crepitus: No Cyanosis: No Excoriation: No Ecchymosis: No Induration: No Erythema: No Rash: No Hemosiderin Staining: No Scarring: No Mottled: No Pallor: No Moisture Rubor: No No Abnormalities Noted: No Dry / Scaly: Yes Temperature / Pain Maceration: No Temperature: No Abnormality Tenderness on Palpation: Yes Treatment Notes Wound #1 (Calcaneus) Wound Laterality: Right Cleanser Soap and Water Discharge Instruction: May shower and wash wound with dial antibacterial soap and water prior to dressing change. Wound Cleanser Discharge Instruction: Cleanse the wound with wound cleanser prior to applying a clean dressing using gauze sponges, not tissue or cotton balls. Peri-Wound Care Sween Lotion (Moisturizing lotion) Gossen, Cecille ([id number]) [id number].pdf Page 5 of 8 Discharge Instruction: Apply moisturizing lotion as directed Topical Primary Dressing Sorbalgon AG Dressing 2x2 (in/in) Discharge Instruction: Apply to wound  bed as instructed Secondary Dressing ABD Pad, 5x9 Discharge Instruction: Apply over primary dressing as directed. Woven Gauze Sponge, Non-Sterile 4x4 in Discharge Instruction: Apply over primary dressing as directed. heel ortho felt Discharge Instruction: apply in shoe to aid in  offloading of heel and wound. Secured With Conforming Stretch Gauze Bandage Roll, Sterile 4x75 (in/in) Discharge Instruction: Secure with stretch gauze as directed. 10M Medipore H Soft Cloth Surgical T ape, 4 x 10 (in/yd) Discharge Instruction: Secure with tape as directed. Compression Wrap Compression Stockings Add-Ons Electronic Signature(s) Signed: 01/08/2023 4:50:23 PM By: Erenest Blank Entered By: Erenest Blank on 01/08/2023 10:02:51 -------------------------------------------------------------------------------- Wound Assessment Details Patient Name: Date of Service: TONOA, VARS 01/08/2023 9:30 A M Medical Record Number: [id number] Patient Account Number: [id number] Date of Birth/Sex: Treating RN: 1966/06/18 (57 y.o. F) Primary Care Kazoua Gossen: Stoney Bang Other Clinician: Referring Sephira Zellman: Treating Martise Waddell/Extender: Dallas Breeding in Treatment: 30 Wound Status Wound Number: 4R Primary Etiology: Diabetic Wound/Ulcer of the Lower Extremity Wound Location: Right, Distal, Plantar Foot Wound Status: Open Wounding Event: Blister Comorbid History: Hypertension, Type II Diabetes, Osteomyelitis Date Acquired: 11/08/2020 Weeks Of Treatment: 12 Clustered Wound: Yes Photos Wound Measurements Length: (cm) 0.5 Width: (cm) 0.2 Depth: (cm) 0.2 Clustered Quantity: 1 Area: (cm) 0.079 Volume: (cm) 0.016 Locatelli, Chriselda ([id number]) Wound Description Classification: Grade 3 Wound Margin: Distinct, outline attached Exudate Amount: Medium Exudate Type: Serosanguineous Exudate Color: red, brown Foul Odor After Cleansing: No Slough/Fibrino No % Reduction in Area: 96.9% % Reduction in Volume: 96.8% Epithelialization: Large (67-100%) Tunneling: No Undermining: No [id number] Page 6 of 8 Wound Bed Granulation Amount: Large (67-100%) Exposed Structure Granulation Quality: Red Fascia Exposed: No Necrotic Amount: None Present (0%) Fat  Layer (Subcutaneous Tissue) Exposed: Yes Tendon Exposed: No Muscle Exposed: No Joint Exposed: No Bone Exposed: No Periwound Skin Texture Texture Color No Abnormalities Noted: No No Abnormalities Noted: No Callus: Yes Atrophie Blanche: No Crepitus: No Cyanosis: No Excoriation: No Ecchymosis: No Induration: No Erythema: No Rash: No Hemosiderin Staining: No Scarring: No Mottled: No Pallor: No Moisture Rubor: No No Abnormalities Noted: No Dry / Scaly: Yes Maceration: No Treatment Notes Wound #4R (Foot) Wound Laterality: Plantar, Right, Distal Cleanser Soap and Water Discharge Instruction: May shower and wash wound with dial antibacterial soap and water prior to dressing change. Wound Cleanser Discharge Instruction: Cleanse the wound with wound cleanser prior to applying a clean dressing using gauze sponges, not tissue or cotton balls. Peri-Wound Care Sween Lotion (Moisturizing lotion) Discharge Instruction: Apply moisturizing lotion as directed Topical Primary Dressing Sorbalgon AG Dressing 2x2 (in/in) Discharge Instruction: Apply to wound bed as instructed Secondary Dressing ABD Pad, 5x9 Discharge Instruction: Apply over primary dressing as directed. Woven Gauze Sponge, Non-Sterile 4x4 in Discharge Instruction: Apply over primary dressing as directed. heel ortho felt Discharge Instruction: apply in shoe to aid in offloading of heel and wound. Secured With Conforming Stretch Gauze Bandage Roll, Sterile 4x75 (in/in) Discharge Instruction: Secure with stretch gauze as directed. 10M Medipore H Soft Cloth Surgical T ape, 4 x 10 (in/yd) Discharge Instruction: Secure with tape as directed. Compression Wrap Compression Stockings Add-Ons Electronic Signature(s) Signed: 01/08/2023 4:50:23 PM By: Kingsley Spittle, Eloni ([id number]) [id number] Page 7 of 8 Signed: 01/08/2023 4:50:23 PM By: Erenest Blank Entered By: Erenest Blank on 01/08/2023  10:03:42 -------------------------------------------------------------------------------- Wound Assessment Details Patient Name: Date of Service: [name], [name] 01/08/2023 9:30 A M Medical Record Number: [id number] Patient Account Number: [id number] Date of Birth/Sex: Treating RN: 29-Nov-1965 (57 y.o. F) Primary Care Kamaury Cutbirth:  Stoney Bang Other Clinician: Referring Mailee Klaas: Treating Imanii Gosdin/Extender: [place] Breeding in Treatment: 30 Wound Status Wound Number: 5 Primary Etiology: Lymphedema Wound Location: Right, Lateral Lower Leg Secondary Etiology: Pressure Ulcer Wounding Event: Gradually Appeared Wound Status: Open Date Acquired: 07/07/2022 Comorbid History: Hypertension, Type II Diabetes, Osteomyelitis Weeks Of Treatment: 2 Clustered Wound: No Photos Wound Measurements Length: (cm) 1.5 Width: (cm) 1.4 Depth: (cm) 0.1 Area: (cm) 1.649 Volume: (cm) 0.165 % Reduction in Area: -7.1% % Reduction in Volume: -7.1% Epithelialization: Small (1-33%) Tunneling: No Undermining: No Wound Description Classification: Full Thickness Without Exposed Supp Wound Margin: Distinct, outline attached Exudate Amount: Medium Exudate Type: Serosanguineous Exudate Color: red, brown ort Structures Foul Odor After Cleansing: No Slough/Fibrino Yes Wound Bed Granulation Amount: Large (67-100%) Exposed Structure Granulation Quality: Red Fascia Exposed: No Necrotic Amount: Small (1-33%) Fat Layer (Subcutaneous Tissue) Exposed: Yes Necrotic Quality: Adherent Slough Tendon Exposed: No Muscle Exposed: No Joint Exposed: No Bone Exposed: No Periwound Skin Texture Texture Color No Abnormalities Noted: No No Abnormalities Noted: No Callus: No Atrophie Blanche: No Crepitus: No Cyanosis: No Excoriation: No Ecchymosis: No Induration: No Erythema: No Rash: No Hemosiderin Staining: Yes Scarring: No Mottled: No Pallor: No Moisture Rubor: No No Abnormalities Noted:  No Dry / Scaly: No Temperature / Pain Maceration: No Temperature: Hot Tenderness on Palpation: Yes [place], Ardell ([id number].pdf Page 8 of 8 Treatment Notes Wound #5 (Lower Leg) Wound Laterality: Right, Lateral Cleanser Soap and Water Discharge Instruction: May shower and wash wound with dial antibacterial soap and water prior to dressing change. Wound Cleanser Discharge Instruction: Cleanse the wound with wound cleanser prior to applying a clean dressing using gauze sponges, not tissue or cotton balls. Peri-Wound Care Sween Lotion (Moisturizing lotion) Discharge Instruction: Apply moisturizing lotion as directed Topical Primary Dressing Sorbalgon AG Dressing 2x2 (in/in) Discharge Instruction: Apply to wound bed as instructed Secondary Dressing Zetuvit Plus Silicone Border Dressing 4x4 (in/in) Discharge Instruction: Apply silicone border over primary dressing as directed. Secured With Compression Wrap Compression Stockings Environmental education officer) Signed: 01/08/2023 4:50:23 PM By: Erenest Blank Entered By: Erenest Blank on 01/08/2023 10:04:29 -------------------------------------------------------------------------------- Vitals Details Patient Name: Date of Service: [name], [name] 01/08/2023 9:30 A M Medical Record Number: [id number] Patient Account Number: [id number] Date of Birth/Sex: Treating RN: 09/25/1966 (57 y.o. F) Primary Care Heavenly Christine: Stoney Bang Other Clinician: Referring Khristy Kalan: Treating Jonanthony Nahar/Extender: [place] Breeding in Treatment: 30 Vital Signs Time Taken: 09:50 Temperature (F): 98.1 Height (in): 65 Pulse (bpm): 98 Weight (lbs): 331 Respiratory Rate (breaths/min): 18 Body Mass Index (BMI): 55.1 Blood Pressure (mmHg): 121/86 Reference Range: 80 - 120 mg / dl Electronic Signature(s) Signed: 01/08/2023 4:50:23 PM By: Erenest Blank Entered By: Erenest Blank on 01/08/2023 09:51:07

## 2023-01-08 NOTE — Progress Notes (Addendum)
[name] ([id number].pdf Page 1 of 11 Visit Report for 01/08/2023 Chief Complaint Document Details Patient Name: Date of Service: [name], [name] 01/08/2023 9:30 A M Medical Record Number: [id number] Patient Account Number: [id number] Date of Birth/Sex: Treating RN: 1966-06-20 (57 y.o. F) Primary Care Provider: Stoney Bang Other Clinician: Referring Provider: Treating Provider/Extender: Dallas Breeding in Treatment: 30 Information Obtained from: Patient Chief Complaint Right heel ulcer Electronic Signature(s) Signed: 01/08/2023 10:15:05 AM By: Worthy Keeler PA-C Entered By: Worthy Keeler on 01/08/2023 10:15:05 -------------------------------------------------------------------------------- Debridement Details Patient Name: Date of Service: [name] 01/08/2023 9:30 A M Medical Record Number: [id number] Patient Account Number: [id number] Date of Birth/Sex: Treating RN: Sep 24, 1966 (57 y.o. Donalda Ewings Primary Care Provider: Stoney Bang Other Clinician: Referring Provider: Treating Provider/Extender: Dallas Breeding in Treatment: 30 Debridement Performed for Assessment: Wound #4R Right,Distal,Plantar Foot Performed By: Physician Worthy Keeler, PA Debridement Type: Debridement Severity of Tissue Pre Debridement: Fat layer exposed Level of Consciousness (Pre-procedure): Awake and Alert Pre-procedure Verification/Time Out Yes - 10:15 Taken: Start Time: 10:18 Pain Control: Lidocaine 4% T opical Solution T Area Debrided (L x W): otal 0.5 (cm) x 0.2 (cm) = 0.1 (cm) Tissue and other material debrided: Non-Viable, Callus Level: Non-Viable Tissue Debridement Description: Selective/Open Wound Instrument: Curette Bleeding: Minimum Hemostasis Achieved: Pressure Procedural Pain: 0 Post Procedural Pain: 0 Response to Treatment: Procedure was tolerated well Level of Consciousness (Post- Awake and  Alert procedure): Post Debridement Measurements of Total Wound Length: (cm) 0.5 Width: (cm) 0.2 Depth: (cm) 0.2 Volume: (cm) 0.016 Character of Wound/Ulcer Post Debridement: Improved Severity of Tissue Post Debridement: Fat layer exposed Post Procedure Diagnosis Same as Pre-procedure Notes Scribed for Jeri Cos, PA by Sharyn Creamer, RN Electronic Signature(s) Signed: 01/08/2023 3:49:12 PM By: Sharyn Creamer RN, BSN Duet, Syreeta ([id number]) [phone].pdf Page 2 of 11 Signed: 01/08/2023 4:14:50 PM By: Worthy Keeler PA-C Entered By: Sharyn Creamer on 01/08/2023 10:19:55 -------------------------------------------------------------------------------- HPI Details Patient Name: Date of Service: [name] 01/08/2023 9:30 A M Medical Record Number: [id number] Patient Account Number: [id number] Date of Birth/Sex: Treating RN: [birth year] (57 y.o. F) Primary Care Provider: Stoney Bang Other Clinician: Referring Provider: Treating Provider/Extender: Dallas Breeding in Treatment: 30 History of Present Illness HPI Description: 06-13-2022 upon evaluation today patient presents for evaluation of her right heel ulcer. She is having a tremendous amount of pain at this location. She tells me that her most recent hemoglobin A1c was 8.7 and that was on 08-23-2021. This ulcer on the heel has been present she states since February 2023 when she had an injection to the heel by podiatry. She states that she began to have increasing pain the wound opened and has been open ever since. 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 She had an x-ray in June if anything can although there did not appear to be any obvious evidence of osteomyelitis at that point. She subsequently has had OR debridement several times of the wound performed by podiatry. Patient does have a history of lymphedema of the lower extremities she is also a type II  diabetic. 06-19-2022 upon evaluation today patient appears to be doing better in regard to the size of her leg which is significantly improved. With that being said she had a lot of drainage from the heel which is completely understandable considering what is going on at this time. There does not appear to be any evidence of active  infection there is no warmth or irritation to the leg in general. With that being said the patient does have an issue here with the amount of drainage that is going on I definitely think Zetuvit is good to be the better way to go in place of ABD pads. Also think that she could potentially benefit from 3 times per week dressing changes but again right now we are going to stick with the to change it today, change in her Friday, and then subsequently seeing where things stand next Wednesday. The other option would be to change her to a different day for wound care having her come then say like a Tuesday Friday or Monday Thursday. 06-26-2022 upon evaluation patient's wound bed actually showed signs of doing well in regard to the overall size it was measuring slightly smaller. With that being said unfortunately the biggest issue we see is she still has a tremendous amount of drainage which is what could keep this from being able to use a total contact cast. For that reason I am did going to discuss with her today the possibility of trying Tubigrip to see if that could be of benefit for her. She voiced understanding and is in agreement with giving this a try. 07-03-2022 upon evaluation today patient unfortunately appears to be doing significantly worse at this point in regard to her swelling due to the fact that she was unable to really wear the Tubigrip. She tells me that it was cutting into her. With that being said she also did have an MRI this MRI revealed that she does have osteomyelitis in the heel this was actually just performed Monday, 25 September. This does show signs of  "early osteomyelitis". Nonetheless this is still concerning to me. The wound also appears to be getting deeper in the center aspect of this which has me a little concerned as well. I do believe that she is likely going require an aggressive approach here to try to get things better. I discussed that in greater detail with her today. I will detailed in the plan. 07-17-2022 upon evaluation today patient appears to be doing poorly in regard to her wound. Unfortunately she does not show any signs of infection systemically though locally this seems to be doing worse with the wound actually being bigger than where it was previous. She did have an MRI that showed evidence of osteomyelitis actually recommended a referral to ID she was evaluated infectious disease and they recommended referral to podiatry and to be honest have recommended amputation based on what the patient tells me today. With that being said this is something that she is not interested in at all to be perfectly honest. For that reason she wants to know what she should do and where she should go at this point that is where the majority of the conversation which was quite lengthy today went. Obviously understand her concern here but I think she is going to have to definitely get off this and likely this means she is going to need to come out of work. 10/18; Since the patient was the patient is using Hydrofera Blue on her right heel wound. She has a new larger open area superior to the wound. The skin on the bottom of her foot is completely macerated. I reviewed the infectious disease note on this patient from 10/9. They recommended keeping the patient on Delofloxacin 450 twice daily until follow-up tomorrow. The patient states she is not going back there as the  only thing they wanted to do was "cut my leg off]. She has not seen podiatry. Her lab works shows a CRP markedly elevated at 240.2 a sedimentation rate of 96 the rest of her CBC is  normal creatinine of 0.38 The patient has been to see her primary doctor who is Dr. Melonie Florida [SPo] In [place]. According the patient Dr. Mylinda Latina has ordered vancomycin and Zosyn to start on November 1. She is not taking the delofloxacin that was suggested by infectious disease. Very angry that they just wanted to consider her for an amputation although I do not specifically see that stated in their note 07-31-2022 upon evaluation today patient still has a significant wound over her plantar aspect of her right l foot region. With that being said I am definitely concerned about the fact that the patient probably does need to be in hyperbaric oxygen therapy at this point. I think we should try to get this going as quickly as possible. She actually is going to be set up with a PICC line next Wednesday and then following this we will have the vancomycin and Zosyn that she will be taking for 8 weeks according to what she tells me. I think this is definitely going to be beneficial and again the goal here is limb salvage. In combination with that I think the hyperbaric oxygen therapy would be ultimately significantly helpful for her. 08-07-2022 upon evaluation today patient appears to still be doing quite poorly overall she still has not gotten her antibiotics. She got her PICC line today which I assumed she will be getting the first dose of antibiotics as well during that time. With that being said unfortunately she did not get the antibiotics and in fact as I questioned her further she does not even know when or if that is going to be started this week. Again I am very concerned about this considering she already has a PICC line in place we do not want this to clot off on top of the fact that she should already be on these antibiotics she just never went to the ER for evaluation therefore they never got this started. At this point based on what I am seeing I really think that the ideal thing would be for [place] to see  about getting her to the ER for further evaluation and treatment to see if they can get this started for her as soon as possible. The patient voiced understanding and is in agreement with the plan. I gave her recommendations for what should be done also called [facility] infusion therapy in order to see if they would be the ones that seem to be available for her IV infusions but they tell me that they no longer do this that is apparently where the orders were sent to for her infusion therapy. That was by her primary care provider. 08-21-2022 upon evaluation today patient's wound on the bottom of the heel and foot area as well as the toe appear to be doing significantly better. Fortunately I do not see any evidence of infection locally or systemically and everything is measuring much smaller than where we have been. The wound in the left gluteal region also is dramatically improved compared to last time she was here. She has started the antibiotics this is cefepime and vancomycin. She did have a somewhat high trough and therefore they have her hold that today and they are to recheck in the morning I believe she told me. Nonetheless with the  antibiotics going she seems to be doing significantly better which is great news. Gibbon, Jenny [name] ([id number].pdf Page 3 of 11 08-28-2022 upon evaluation today patient appears to be doing somewhat better in regard to her foot there is definitely less drainage than what we have seen in the past. I do believe the antibiotics are helping. With that being said she is also been approved for hyperbaric oxygen therapy and I think as soon as we get this started the better. I discussed that with her today as well. With that being said I do think that the last thing she needs is her chest x-ray when she has that done will be ready to start her into treatment. She is going to try to go get that today when she leaves which I think would be  ideal. 09-11-2022 upon evaluation today patient appears to be doing well with regard to her foot ulcer. This is showing signs of no digression and she does not appear to be having as much drainage as before though it still very wet is not nearly the extreme of what it was previous. Fortunately there does not appear to be any signs of active infection locally nor systemically at this point which is great news. Patient did have her chest x-ray as well as EKG and she is actually approved and completely cleared for HBO therapy from both a clinical standpoint as well as an insurance standpoint. 09-25-2022 upon evaluation today patient appears to be doing well in regard to her foot ulcer. Unfortunately she is not doing as well when it comes to her hyperbaric treatments. She is having a lot of trouble with claustrophobia. She tells me in general that she is not sure she is good to be able to continue. 10-09-2022 upon evaluation today patient's wounds actually are showing signs of excellent improvement which is great news. Fortunately I do not see any evidence of infection locally or systemically which is great news and overall I am extremely pleased with where things are currently. I do think that she is making good progress she does tell me at this point today she does not want to go forward with a hyperbaric oxygen therapy. 10-16-2022 upon evaluation today patient appears to be doing excellent in regard to her foot ulcer. This is actually showing signs of excellent improvement. She still does not allow for any sharp debridement but the good news is this is doing much better and does not even need it at the moment. Nonetheless as far as sharp debridement is concerned this has been too painful for really not been able to do that all along. 10-23-2022 upon evaluation today patient actually is making excellent progress. I am extremely pleased with where we stand I have considered even seeing about putting her in the  total contact cast but to be honest she is doing so well currently and still with having some drainage I feel like it is better for her to be able to change it then to be locked up in the cast for a week at a time. She has voiced understanding as well. Overall though I feel like you are making some really good progress here. 10-30-2022 upon evaluation today patient appears to be doing excellent in regard to her foot ulcers. Both are showing signs of improvement which is great news and overall I feel like that they are measuring smaller, looking better, and I see no signs of resurgence of the infection which is great news. Overall I am  extremely pleased at this point. 2/7; the patient has 2 wounds which are remanence of the large wound on the right foot. This was a diabetic wound with underlying osteomyelitis she has completed antibiotics. Today she has the open area on the distal calcaneus and an area on the right midfoot which is eschared. She has been using silver alginate and offloading this in a regular running shoe. She works as a Librarian, academic in a long-term care facility but otherwise seems to offload this as much as possible. 11-27-2022 upon evaluation today patient appears to be doing well currently in regard to her wound which is actually showing signs of significant improvement this is great news. Fortunately there does not appear to be any signs of active infection locally nor systemically which is also excellent. No fevers, chills, nausea, vomiting, or diarrhea. 12-25-2022 upon evaluation today patient appears to be doing well currently in regard to the distal portion of her foot which is actually in my opinion looking a little better but have a lot of callus she is going require some debridement here. With that being said the heel actually looks like it might be a little bit worse. There is a lot of callus I cannot tell exactly what is open or not we can remove that and we will see what  exactly were dealing with there. With that being said she also has an area on the right side of her leg which appears to be more of a pressure injury she tells me this is from sitting in her recliner she has gotten a pillow to help take care of this. Fortunately it is not technically open and draining at this point made it very well may be before it is also not done. 01-01-2023 upon evaluation today patient appears to be doing well currently in regard to her wounds I feel like the cefdinir has helped she seems to be improved compared to last week's evaluation. I do not see any signs of active infection locally nor systemically at this time which is great news. No fevers, chills, nausea, vomiting, or diarrhea. 01-08-2023 upon evaluation today patient's wounds on the foot appear to be doing decently well the leg is still about the same. With that being said unfortunately she does have a lot of erythema on the leg in particular which is unfortunate and definitely not what we are looking for. Again I do not think that the [facility] has really done well for her in that regard. Electronic Signature(s) Signed: 01/08/2023 1:56:01 PM By: Worthy Keeler PA-C Entered By: Worthy Keeler on 01/08/2023 13:56:00 -------------------------------------------------------------------------------- Physical Exam Details Patient Name: Date of Service: EVERLYN, GUPTA 01/08/2023 9:30 A M Medical Record Number: [id number] Patient Account Number: [id number] Date of Birth/Sex: Treating RN: [birth year] (57 y.o. F) Primary Care Provider: Stoney Bang Other Clinician: Referring Provider: Treating Provider/Extender: Dallas Breeding in Treatment: 70 Constitutional Well-nourished and well-hydrated in no acute distress. Respiratory normal breathing without difficulty. Psychiatric this patient is able to make decisions and demonstrates good insight into disease process. Alert and Oriented x 3. pleasant and  cooperative. Notes Upon inspection patient's wound bed again showed signs of need for debridement in the midfoot no other areas of debridement were necessary today. With that being said her leg is very erythematous I think that she probably needs to have a culture here and I did obtain that today after cleaning the wound well I then performed the culture to see what this shows we  will reevaluate next week and see where we stand. Guaman, Jenny [name] ([id number].pdf Page 4 of 11 Electronic Signature(s) Signed: 01/08/2023 1:56:27 PM By: Worthy Keeler PA-C Entered By: Worthy Keeler on 01/08/2023 13:56:26 -------------------------------------------------------------------------------- Physician Orders Details Patient Name: Date of Service: ABENA, LORMAND 01/08/2023 9:30 A M Medical Record Number: [id number] Patient Account Number: [id number] Date of Birth/Sex: Treating RN: [birth year] (57 y.o. Donalda Ewings Primary Care Provider: Stoney Bang Other Clinician: Referring Provider: Treating Provider/Extender: Dallas Breeding in Treatment: 30 Verbal / Phone Orders: No Diagnosis Coding ICD-10 Coding Code Description [phone] Other chronic osteomyelitis, right ankle and foot E11.621 Type 2 diabetes mellitus with foot ulcer L97.412 Non-pressure chronic ulcer of right heel and midfoot with fat layer exposed I89.0 Lymphedema, not elsewhere classified Follow-up Appointments ppointment in 1 week. Margarita Grizzle, [place] Wednesday room 9 8:45 01/15/23 Return A Anesthetic (In clinic) Topical Lidocaine 5% applied to wound bed Bathing/ Shower/ Hygiene May shower with protection but do not get wound dressing(s) wet. Protect dressing(s) with water repellant cover (for example, large plastic bag) or a cast cover and may then take shower. Edema Control - Lymphedema / SCD / Other Elevate legs to the level of the heart or above for 30 minutes daily and/or when sitting for  3-4 times a day throughout the day. Avoid standing for long periods of time. Off-Loading Other: - Minimize walking and standing as much as possible to right foot to aid in offloading to wound. While at work use the wheelchair for mobility. felt in shoe to aid in offloading wound and heel. Wound Treatment Wound #1 - Calcaneus Wound Laterality: Right Cleanser: Soap and Water 1 x Per Day/30 Days Discharge Instructions: May shower and wash wound with dial antibacterial soap and water prior to dressing change. Cleanser: Wound Cleanser (Generic) 1 x Per Day/30 Days Discharge Instructions: Cleanse the wound with wound cleanser prior to applying a clean dressing using gauze sponges, not tissue or cotton balls. Peri-Wound Care: Sween Lotion (Moisturizing lotion) 1 x Per Day/30 Days Discharge Instructions: Apply moisturizing lotion as directed Prim Dressing: Sorbalgon AG Dressing 2x2 (in/in) (Generic) 1 x Per Day/30 Days ary Discharge Instructions: Apply to wound bed as instructed Secondary Dressing: ABD Pad, 5x9 (DME) (Generic) 1 x Per Day/30 Days Discharge Instructions: Apply over primary dressing as directed. Secondary Dressing: Woven Gauze Sponge, Non-Sterile 4x4 in (Generic) 1 x Per Day/30 Days Discharge Instructions: Apply over primary dressing as directed. Secondary Dressing: heel ortho felt 1 x Per Day/30 Days Discharge Instructions: apply in shoe to aid in offloading of heel and wound. Secured With: Scientist, forensic, Sterile 4x75 (in/in) (DME) (Generic) 1 x Per Day/30 Days Discharge Instructions: Secure with stretch gauze as directed. Secured With: 52M Medipore H Soft Cloth Surgical T ape, 4 x 10 (in/yd) (Generic) 1 x Per Day/30 Days Discharge Instructions: Secure with tape as directed. Wound #4R - Foot Wound Laterality: Plantar, Right, Distal Kinne, Keshana ([id number].pdf Page 5 of 11 Cleanser: Soap and Water 1 x Per Day/30  Days Discharge Instructions: May shower and wash wound with dial antibacterial soap and water prior to dressing change. Cleanser: Wound Cleanser (Generic) 1 x Per Day/30 Days Discharge Instructions: Cleanse the wound with wound cleanser prior to applying a clean dressing using gauze sponges, not tissue or cotton balls. Peri-Wound Care: Sween Lotion (Moisturizing lotion) 1 x Per Day/30 Days Discharge Instructions: Apply moisturizing lotion as directed Prim Dressing: Sorbalgon AG Dressing 2x2 (in/in) (Generic) 1 x  Per Day/30 Days ary Discharge Instructions: Apply to wound bed as instructed Secondary Dressing: ABD Pad, 5x9 (Generic) 1 x Per Day/30 Days Discharge Instructions: Apply over primary dressing as directed. Secondary Dressing: Woven Gauze Sponge, Non-Sterile 4x4 in (Generic) 1 x Per Day/30 Days Discharge Instructions: Apply over primary dressing as directed. Secondary Dressing: heel ortho felt 1 x Per Day/30 Days Discharge Instructions: apply in shoe to aid in offloading of heel and wound. Secured With: Scientist, forensic, Sterile 4x75 (in/in) (Generic) 1 x Per Day/30 Days Discharge Instructions: Secure with stretch gauze as directed. Secured With: 74M Medipore H Soft Cloth Surgical T ape, 4 x 10 (in/yd) (Generic) 1 x Per Day/30 Days Discharge Instructions: Secure with tape as directed. Wound #5 - Lower Leg Wound Laterality: Right, Lateral Cleanser: Soap and Water 1 x Per Day/30 Days Discharge Instructions: May shower and wash wound with dial antibacterial soap and water prior to dressing change. Cleanser: Wound Cleanser (Generic) 1 x Per Day/30 Days Discharge Instructions: Cleanse the wound with wound cleanser prior to applying a clean dressing using gauze sponges, not tissue or cotton balls. Peri-Wound Care: Sween Lotion (Moisturizing lotion) 1 x Per Day/30 Days Discharge Instructions: Apply moisturizing lotion as directed Prim Dressing: Sorbalgon AG Dressing 2x2  (in/in) (Generic) 1 x Per Day/30 Days ary Discharge Instructions: Apply to wound bed as instructed Secondary Dressing: Zetuvit Plus Silicone Border Dressing 4x4 (in/in) 1 x Per Day/30 Days Discharge Instructions: Apply silicone border over primary dressing as directed. Laboratory naerobe culture (MICRO) Bacteria identified in Unspecified specimen by A LOINC Code: [id number] Convenience Name: Anaerobic culture Electronic Signature(s) Signed: 01/08/2023 3:49:12 PM By: Sharyn Creamer RN, BSN Signed: 01/08/2023 4:14:50 PM By: Worthy Keeler PA-C Entered By: Sharyn Creamer on 01/08/2023 10:33:59 Prescription 01/08/2023 -------------------------------------------------------------------------------- Shon Hale PA Patient Name: Provider: 04/21/1966 [id number] Date of Birth: NPI#Rickey Primus Sex: DEA #: [phone] Phone #: License #: [place] Patient Address: [address] [place], [place] [address] [place], [place] [address] [phone] Allergies No Known [place], [place] ([id number]) [id number] Page 6 of 11 Provider's Orders Bacteria identified in Unspecified specimen by Anaerobe culture LOINC Code: [id number] Convenience Name: Anaerobic culture Hand Signature: Date(s): Electronic Signature(s) Signed: 01/08/2023 3:49:12 PM By: Sharyn Creamer RN, BSN Signed: 01/08/2023 4:14:50 PM By: Worthy Keeler PA-C Entered By: Sharyn Creamer on 01/08/2023 10:34:00 -------------------------------------------------------------------------------- Problem List Details Patient Name: Date of Service: SANVITHA, NAPLES 01/08/2023 9:30 A M Medical Record Number: [id number] Patient Account Number: [id number] Date of Birth/Sex: Treating RN: 1966/02/11 (57 y.o. F) Primary Care Provider: Stoney Bang Other Clinician: Referring Provider: Treating Provider/Extender: Dallas Breeding in Treatment: 30 Active Problems ICD-10 Encounter Code Description Active Date MDM Diagnosis [phone] Other chronic osteomyelitis, right ankle and foot 08/28/2022 No Yes E11.621 Type 2 diabetes mellitus with foot ulcer 06/12/2022 No Yes L97.412 Non-pressure chronic ulcer of right heel and midfoot with fat layer exposed 06/12/2022 No Yes I89.0 Lymphedema, not elsewhere classified 06/12/2022 No Yes Inactive Problems Resolved Problems Electronic Signature(s) Signed: 01/08/2023 10:14:58 AM By: Worthy Keeler PA-C Entered By: Worthy Keeler on 01/08/2023 10:14:58 -------------------------------------------------------------------------------- Progress Note Details Patient Name: Date of Service: ARANDA, DIBBLE 01/08/2023 9:30 A M Medical Record Number: [id number] Patient Account Number: [id number] Date of Birth/Sex: Treating RN: 1966-02-14 (57 y.o. F) Primary Care Provider: Stoney Bang Other Clinician: Referring Provider: Treating Provider/Extender: Judye Bos, Areta Haber  in Treatment: [place], Tanja ([id number]) [id number] Page 7 of 11 Chief Complaint Information obtained from Patient Right heel ulcer History of Present Illness (HPI) 06-13-2022 upon evaluation today patient presents for evaluation of her right heel ulcer. She is having a tremendous amount of pain at this location. She tells me that her most recent hemoglobin A1c was 8.7 and that was on 08-23-2021. This ulcer on the heel has been present she states since February 2023 when she had an injection to the heel by podiatry. She states that she began to have increasing pain the wound opened and has been open ever since. 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 She had an x-ray in June if anything can although there did not appear to be any obvious evidence of osteomyelitis at that point. She subsequently has had OR debridement several times of  the wound performed by podiatry. Patient does have a history of lymphedema of the lower extremities she is also a type II diabetic. 06-19-2022 upon evaluation today patient appears to be doing better in regard to the size of her leg which is significantly improved. With that being said she had a lot of drainage from the heel which is completely understandable considering what is going on at this time. There does not appear to be any evidence of active infection there is no warmth or irritation to the leg in general. With that being said the patient does have an issue here with the amount of drainage that is going on I definitely think Zetuvit is good to be the better way to go in place of ABD pads. Also think that she could potentially benefit from 3 times per week dressing changes but again right now we are going to stick with the to change it today, change in her Friday, and then subsequently seeing where things stand next Wednesday. The other option would be to change her to a different day for wound care having her come then say like a Tuesday Friday or Monday Thursday. 06-26-2022 upon evaluation patient's wound bed actually showed signs of doing well in regard to the overall size it was measuring slightly smaller. With that being said unfortunately the biggest issue we see is she still has a tremendous amount of drainage which is what could keep this from being able to use a total contact cast. For that reason I am did going to discuss with her today the possibility of trying Tubigrip to see if that could be of benefit for her. She voiced understanding and is in agreement with giving this a try. 07-03-2022 upon evaluation today patient unfortunately appears to be doing significantly worse at this point in regard to her swelling due to the fact that she was unable to really wear the Tubigrip. She tells me that it was cutting into her. With that being said she also did have an MRI this MRI revealed  that she does have osteomyelitis in the heel this was actually just performed Monday, 25 September. This does show signs of "early osteomyelitis". Nonetheless this is still concerning to me. The wound also appears to be getting deeper in the center aspect of this which has me a little concerned as well. I do believe that she is likely going require an aggressive approach here to try to get things better. I discussed that in greater detail with her today. I will detailed in the plan. 07-17-2022 upon evaluation today patient appears to be doing poorly in  regard to her wound. Unfortunately she does not show any signs of infection systemically though locally this seems to be doing worse with the wound actually being bigger than where it was previous. She did have an MRI that showed evidence of osteomyelitis actually recommended a referral to ID she was evaluated infectious disease and they recommended referral to podiatry and to be honest have recommended amputation based on what the patient tells me today. With that being said this is something that she is not interested in at all to be perfectly honest. For that reason she wants to know what she should do and where she should go at this point that is where the majority of the conversation which was quite lengthy today went. Obviously understand her concern here but I think she is going to have to definitely get off this and likely this means she is going to need to come out of work. 10/18; Since the patient was the patient is using Hydrofera Blue on her right heel wound. She has a new larger open area superior to the wound. The skin on the bottom of her foot is completely macerated. I reviewed the infectious disease note on this patient from 10/9. They recommended keeping the patient on Delofloxacin 450 twice daily until follow-up tomorrow. The patient states she is not going back there as the only thing they wanted to do was "cut my leg off]. She has  not seen podiatry. Her lab works shows a CRP markedly elevated at 240.2 a sedimentation rate of 96 the rest of her CBC is normal creatinine of 0.38 The patient has been to see her primary doctor who is Dr. Melonie Florida [SP[facility]] In [place]. According the patient Dr. Mylinda Latina has ordered vancomycin and Zosyn to start on November 1. She is not taking the delofloxacin that was suggested by infectious disease. Very angry that they just wanted to consider her for an amputation although I do not specifically see that stated in their note 07-31-2022 upon evaluation today patient still has a significant wound over her plantar aspect of her right l foot region. With that being said I am definitely concerned about the fact that the patient probably does need to be in hyperbaric oxygen therapy at this point. I think we should try to get this going as quickly as possible. She actually is going to be set up with a PICC line next Wednesday and then following this we will have the vancomycin and Zosyn that she will be taking for 8 weeks according to what she tells me. I think this is definitely going to be beneficial and again the goal here is limb salvage. In combination with that I think the hyperbaric oxygen therapy would be ultimately significantly helpful for her. 08-07-2022 upon evaluation today patient appears to still be doing quite poorly overall she still has not gotten her antibiotics. She got her PICC line today which I assumed she will be getting the first dose of antibiotics as well during that time. With that being said unfortunately she did not get the antibiotics and in fact as I questioned her further she does not even know when or if that is going to be started this week. Again I am very concerned about this considering she already has a PICC line in place we do not want this to clot off on top of the fact that she should already be on these antibiotics she just never went to the ER for evaluation therefore they  never  got this started. At this point based on what I am seeing I really think that the ideal thing would be for [place] to see about getting her to the ER for further evaluation and treatment to see if they can get this started for her as soon as possible. The patient voiced understanding and is in agreement with the plan. I gave her recommendations for what should be done also called [facility] infusion therapy in order to see if they would be the ones that seem to be available for her IV infusions but they tell me that they no longer do this that is apparently where the orders were sent to for her infusion therapy. That was by her primary care provider. 08-21-2022 upon evaluation today patient's wound on the bottom of the heel and foot area as well as the toe appear to be doing significantly better. Fortunately I do not see any evidence of infection locally or systemically and everything is measuring much smaller than where we have been. The wound in the left gluteal region also is dramatically improved compared to last time she was here. She has started the antibiotics this is cefepime and vancomycin. She did have a somewhat high trough and therefore they have her hold that today and they are to recheck in the morning I believe she told me. Nonetheless with the antibiotics going she seems to be doing significantly better which is great news. 08-28-2022 upon evaluation today patient appears to be doing somewhat better in regard to her foot there is definitely less drainage than what we have seen in the past. I do believe the antibiotics are helping. With that being said she is also been approved for hyperbaric oxygen therapy and I think as soon as we get this started the better. I discussed that with her today as well. With that being said I do think that the last thing she needs is her chest x-ray when she has that done will be ready to start her into treatment. She is going to try to go get that today when  she leaves which I think would be ideal. 09-11-2022 upon evaluation today patient appears to be doing well with regard to her foot ulcer. This is showing signs of no digression and she does not appear to be having as much drainage as before though it still very wet is not nearly the extreme of what it was previous. Fortunately there does not appear to be any signs of active infection locally nor systemically at this point which is great news. Patient did have her chest x-ray as well as EKG and she is actually approved and completely cleared for HBO therapy from both a clinical standpoint as well as an insurance standpoint. 09-25-2022 upon evaluation today patient appears to be doing well in regard to her foot ulcer. Unfortunately she is not doing as well when it comes to her hyperbaric treatments. She is having a lot of trouble with claustrophobia. She tells me in general that she is not sure she is good to be able to continue. 10-09-2022 upon evaluation today patient's wounds actually are showing signs of excellent improvement which is great news. Fortunately I do not see any evidence of infection locally or systemically which is great news and overall I am extremely pleased with where things are currently. I do think that she is making good progress she does tell me at this point today she does not want to go forward with a hyperbaric oxygen therapy. Valek,  Shere ([id number] Page 8 of 11 10-16-2022 upon evaluation today patient appears to be doing excellent in regard to her foot ulcer. This is actually showing signs of excellent improvement. She still does not allow for any sharp debridement but the good news is this is doing much better and does not even need it at the moment. Nonetheless as far as sharp debridement is concerned this has been too painful for really not been able to do that all along. 10-23-2022 upon evaluation today patient actually is making  excellent progress. I am extremely pleased with where we stand I have considered even seeing about putting her in the total contact cast but to be honest she is doing so well currently and still with having some drainage I feel like it is better for her to be able to change it then to be locked up in the cast for a week at a time. She has voiced understanding as well. Overall though I feel like you are making some really good progress here. 10-30-2022 upon evaluation today patient appears to be doing excellent in regard to her foot ulcers. Both are showing signs of improvement which is great news and overall I feel like that they are measuring smaller, looking better, and I see no signs of resurgence of the infection which is great news. Overall I am extremely pleased at this point. 2/7; the patient has 2 wounds which are remanence of the large wound on the right foot. This was a diabetic wound with underlying osteomyelitis she has completed antibiotics. Today she has the open area on the distal calcaneus and an area on the right midfoot which is eschared. She has been using silver alginate and offloading this in a regular running shoe. She works as a Librarian, academic in a long-term care facility but otherwise seems to offload this as much as possible. 11-27-2022 upon evaluation today patient appears to be doing well currently in regard to her wound which is actually showing signs of significant improvement this is great news. Fortunately there does not appear to be any signs of active infection locally nor systemically which is also excellent. No fevers, chills, nausea, vomiting, or diarrhea. 12-25-2022 upon evaluation today patient appears to be doing well currently in regard to the distal portion of her foot which is actually in my opinion looking a little better but have a lot of callus she is going require some debridement here. With that being said the heel actually looks like it might be a little bit  worse. There is a lot of callus I cannot tell exactly what is open or not we can remove that and we will see what exactly were dealing with there. With that being said she also has an area on the right side of her leg which appears to be more of a pressure injury she tells me this is from sitting in her recliner she has gotten a pillow to help take care of this. Fortunately it is not technically open and draining at this point made it very well may be before it is also not done. 01-01-2023 upon evaluation today patient appears to be doing well currently in regard to her wounds I feel like the cefdinir has helped she seems to be improved compared to last week's evaluation. I do not see any signs of active infection locally nor systemically at this time which is great news. No fevers, chills, nausea, vomiting, or diarrhea. 01-08-2023 upon evaluation today patient's wounds on the foot  appear to be doing decently well the leg is still about the same. With that being said unfortunately she does have a lot of erythema on the leg in particular which is unfortunate and definitely not what we are looking for. Again I do not think that the [facility] has really done well for her in that regard. Objective Constitutional Well-nourished and well-hydrated in no acute distress. Vitals Time Taken: 9:50 AM, Height: 65 in, Weight: 331 lbs, BMI: 55.1, Temperature: 98.1 F, Pulse: 98 bpm, Respiratory Rate: 18 breaths/min, Blood Pressure: 121/86 mmHg. Respiratory normal breathing without difficulty. Psychiatric this patient is able to make decisions and demonstrates good insight into disease process. Alert and Oriented x 3. pleasant and cooperative. General Notes: Upon inspection patient's wound bed again showed signs of need for debridement in the midfoot no other areas of debridement were necessary today. With that being said her leg is very erythematous I think that she probably needs to have a culture here and I did  obtain that today after cleaning the wound well I then performed the culture to see what this shows we will reevaluate next week and see where we stand. Integumentary (Hair, Skin) Wound #1 status is Open. Original cause of wound was Gradually Appeared. The date acquired was: 11/07/2020. The wound has been in treatment 30 weeks. The wound is located on the Right Calcaneus. The wound measures 1cm length x 1.4cm width x 0.3cm depth; 1.1cm^2 area and 0.33cm^3 volume. There is Fat Layer (Subcutaneous Tissue) exposed. There is no tunneling or undermining noted. There is a medium amount of serosanguineous drainage noted. The wound margin is thickened. There is large (67-100%) red, pink granulation within the wound bed. There is a small (1-33%) amount of necrotic tissue within the wound bed including Adherent Slough. The periwound skin appearance exhibited: Callus, Dry/Scaly. The periwound skin appearance did not exhibit: Crepitus, Excoriation, Induration, Rash, Scarring, Maceration, Atrophie Blanche, Cyanosis, Ecchymosis, Hemosiderin Staining, Mottled, Pallor, Rubor, Erythema. Periwound temperature was noted as No Abnormality. The periwound has tenderness on palpation. Wound #4R status is Open. Original cause of wound was Blister. The date acquired was: 11/08/2020. The wound has been in treatment 12 weeks. The wound is located on the Daniels. The wound measures 0.5cm length x 0.2cm width x 0.2cm depth; 0.079cm^2 area and 0.016cm^3 volume. There is Fat Layer (Subcutaneous Tissue) exposed. There is no tunneling or undermining noted. There is a medium amount of serosanguineous drainage noted. The wound margin is distinct with the outline attached to the wound base. There is large (67-100%) red granulation within the wound bed. There is no necrotic tissue within the wound bed. The periwound skin appearance exhibited: Callus, Dry/Scaly. The periwound skin appearance did not exhibit: Crepitus,  Excoriation, Induration, Rash, Scarring, Maceration, Atrophie Blanche, Cyanosis, Ecchymosis, Hemosiderin Staining, Mottled, Pallor, Rubor, Erythema. Wound #5 status is Open. Original cause of wound was Gradually Appeared. The date acquired was: 07/07/2022. The wound has been in treatment 2 weeks. The wound is located on the Right,Lateral Lower Leg. The wound measures 1.5cm length x 1.4cm width x 0.1cm depth; 1.649cm^2 area and 0.165cm^3 volume. There is Fat Layer (Subcutaneous Tissue) exposed. There is no tunneling or undermining noted. There is a medium amount of serosanguineous drainage noted. The wound margin is distinct with the outline attached to the wound base. There is large (67-100%) red granulation within the wound bed. There is a small (1-33%) amount of necrotic tissue within the wound bed including Adherent Slough. The periwound skin appearance exhibited:  Hemosiderin Staining. The periwound skin appearance did not exhibit: Callus, Crepitus, Excoriation, Induration, Rash, Scarring, Dry/Scaly, Maceration, Atrophie Blanche, Cyanosis, Ecchymosis, Mottled, Pallor, Rubor, Erythema. Periwound temperature was noted as Hot. The periwound has tenderness on palpation. Salah, Jenny [name] ([id number] Page 9 of 11 Assessment Active Problems ICD-10 Other chronic osteomyelitis, right ankle and foot Type 2 diabetes mellitus with foot ulcer Non-pressure chronic ulcer of right heel and midfoot with fat layer exposed Lymphedema, not elsewhere classified Procedures Wound #4R Pre-procedure diagnosis of Wound #4R is a Diabetic Wound/Ulcer of the Lower Extremity located on the Right,Distal,Plantar Foot .Severity of Tissue Pre Debridement is: Fat layer exposed. There was a Selective/Open Wound Non-Viable Tissue Debridement with a total area of 0.1 sq cm performed by Worthy Keeler, PA. With the following instrument(s): Curette to remove Non-Viable tissue/material. Material  removed includes Callus after achieving pain control using Lidocaine 4% Topical Solution. No specimens were taken. A time out was conducted at 10:15, prior to the start of the procedure. A Minimum amount of bleeding was controlled with Pressure. The procedure was tolerated well with a pain level of 0 throughout and a pain level of 0 following the procedure. Post Debridement Measurements: 0.5cm length x 0.2cm width x 0.2cm depth; 0.016cm^3 volume. Character of Wound/Ulcer Post Debridement is improved. Severity of Tissue Post Debridement is: Fat layer exposed. Post procedure Diagnosis Wound #4R: Same as Pre-Procedure General Notes: Scribed for Jeri Cos, PA by Sharyn Creamer, RN. Plan Follow-up Appointments: Return Appointment in 1 week. Margarita Grizzle, [place] Wednesday room 9 8:45 01/15/23 Anesthetic: (In clinic) Topical Lidocaine 5% applied to wound bed Bathing/ Shower/ Hygiene: May shower with protection but do not get wound dressing(s) wet. Protect dressing(s) with water repellant cover (for example, large plastic bag) or a cast cover and may then take shower. Edema Control - Lymphedema / SCD / Other: Elevate legs to the level of the heart or above for 30 minutes daily and/or when sitting for 3-4 times a day throughout the day. Avoid standing for long periods of time. Off-Loading: Other: - Minimize walking and standing as much as possible to right foot to aid in offloading to wound. While at work use the wheelchair for mobility. felt in shoe to aid in offloading wound and heel. Laboratory ordered were: Anaerobic culture WOUND #1: - Calcaneus Wound Laterality: Right Cleanser: Soap and Water 1 x Per Day/30 Days Discharge Instructions: May shower and wash wound with dial antibacterial soap and water prior to dressing change. Cleanser: Wound Cleanser (Generic) 1 x Per Day/30 Days Discharge Instructions: Cleanse the wound with wound cleanser prior to applying a clean dressing using gauze sponges, not  tissue or cotton balls. Peri-Wound Care: Sween Lotion (Moisturizing lotion) 1 x Per Day/30 Days Discharge Instructions: Apply moisturizing lotion as directed Prim Dressing: Sorbalgon AG Dressing 2x2 (in/in) (Generic) 1 x Per Day/30 Days ary Discharge Instructions: Apply to wound bed as instructed Secondary Dressing: ABD Pad, 5x9 (DME) (Generic) 1 x Per Day/30 Days Discharge Instructions: Apply over primary dressing as directed. Secondary Dressing: Woven Gauze Sponge, Non-Sterile 4x4 in (Generic) 1 x Per Day/30 Days Discharge Instructions: Apply over primary dressing as directed. Secondary Dressing: heel ortho felt 1 x Per Day/30 Days Discharge Instructions: apply in shoe to aid in offloading of heel and wound. Secured With: Scientist, forensic, Sterile 4x75 (in/in) (DME) (Generic) 1 x Per Day/30 Days Discharge Instructions: Secure with stretch gauze as directed. Secured With: 69M Medipore H Soft Cloth Surgical T ape, 4 x  10 (in/yd) (Generic) 1 x Per Day/30 Days Discharge Instructions: Secure with tape as directed. WOUND #4R: - Foot Wound Laterality: Plantar, Right, Distal Cleanser: Soap and Water 1 x Per Day/30 Days Discharge Instructions: May shower and wash wound with dial antibacterial soap and water prior to dressing change. Cleanser: Wound Cleanser (Generic) 1 x Per Day/30 Days Discharge Instructions: Cleanse the wound with wound cleanser prior to applying a clean dressing using gauze sponges, not tissue or cotton balls. Peri-Wound Care: Sween Lotion (Moisturizing lotion) 1 x Per Day/30 Days Discharge Instructions: Apply moisturizing lotion as directed Prim Dressing: Sorbalgon AG Dressing 2x2 (in/in) (Generic) 1 x Per Day/30 Days ary Discharge Instructions: Apply to wound bed as instructed Secondary Dressing: ABD Pad, 5x9 (Generic) 1 x Per Day/30 Days Discharge Instructions: Apply over primary dressing as directed. Secondary Dressing: Woven Gauze Sponge, Non-Sterile  4x4 in (Generic) 1 x Per Day/30 Days Discharge Instructions: Apply over primary dressing as directed. Secondary Dressing: heel ortho felt 1 x Per Day/30 Days Discharge Instructions: apply in shoe to aid in offloading of heel and wound. Secured With: Scientist, forensic, Sterile 4x75 (in/in) (Generic) 1 x Per Day/30 Days Discharge Instructions: Secure with stretch gauze as directed. Secured With: 41M Medipore H Soft Cloth Surgical T ape, 4 x 10 (in/yd) (Generic) 1 x Per Day/30 Days Discharge Instructions: Secure with tape as directed. Creasey, Jenny [name] ([id number] Page 10 of 11 WOUND #5: - Lower Leg Wound Laterality: Right, Lateral Cleanser: Soap and Water 1 x Per Day/30 Days Discharge Instructions: May shower and wash wound with dial antibacterial soap and water prior to dressing change. Cleanser: Wound Cleanser (Generic) 1 x Per Day/30 Days Discharge Instructions: Cleanse the wound with wound cleanser prior to applying a clean dressing using gauze sponges, not tissue or cotton balls. Peri-Wound Care: Sween Lotion (Moisturizing lotion) 1 x Per Day/30 Days Discharge Instructions: Apply moisturizing lotion as directed Prim Dressing: Sorbalgon AG Dressing 2x2 (in/in) (Generic) 1 x Per Day/30 Days ary Discharge Instructions: Apply to wound bed as instructed Secondary Dressing: Zetuvit Plus Silicone Border Dressing 4x4 (in/in) 1 x Per Day/30 Days Discharge Instructions: Apply silicone border over primary dressing as directed. 1. I am good recommend that we have the patient continue to monitor for any signs of worsening or infection. Based on what I am seeing I do believe that the patient is making some good progress in regard to her foot but I am not happy with the leg at all. I discussed that with her today. Prior to putting on additional antibiotics I am assuming the culture shows we should have that hopefully by Monday. 2. I did advise that  if anything worsen significantly she should go to the ER for consideration of IV antibiotics again right now I am not headed straight that direction but is something we definitely need to consider. We will see patient back for reevaluation in 1 week here in the clinic. If anything worsens or changes patient will contact our office for additional recommendations. Electronic Signature(s) Signed: 01/08/2023 1:57:04 PM By: Worthy Keeler PA-C Entered By: Worthy Keeler on 01/08/2023 13:57:04 -------------------------------------------------------------------------------- SuperBill Details Patient Name: Date of Service: IDIA, EMMERT 01/08/2023 Medical Record Number: [id number] Patient Account Number: [id number] Date of Birth/Sex: Treating RN: 1966/07/24 (57 y.o. 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 Modifier: Quantity: 1 Physician Procedures : CPT4 Code Description Modifier [id number] - WC PHYS LEVEL 4 - EST PT 25 ICD-10 Diagnosis Description M86.671 Other chronic osteomyelitis, right ankle and foot E11.621 Type 2 diabetes mellitus with foot ulcer L97.412 Non-pressure chronic ulcer of  right heel and midfoot with fat layer exposed I89.0 Lymphedema, not elsewhere classified Quantity: 1 : [id number] - WC PHYS DEBR WO ANESTH 20 SQ CM ICD-10 Diagnosis Description L97.412 Non-pressure chronic  ulcer of right heel and midfoot with fat layer exposed Quantity: 1 Electronic Signature(s) Signed: 01/08/2023 1:57:38 PM By: Worthy Keeler PA-C Entered By: Worthy Keeler on 01/08/2023 13:57:37 Vanegas, Belenda ([id number] Page 11 of 11

## 2023-01-09 IMAGING — DX DG FOOT COMPLETE 3+V*R*
4 series · 4 of 4 positions shown · non-contrast
Comparison: 01/25/2021

CLINICAL DATA: Postoperative for distal right second digit
amputation.

EXAM:
RIGHT FOOT COMPLETE - 3+ VIEW

[foot ap]
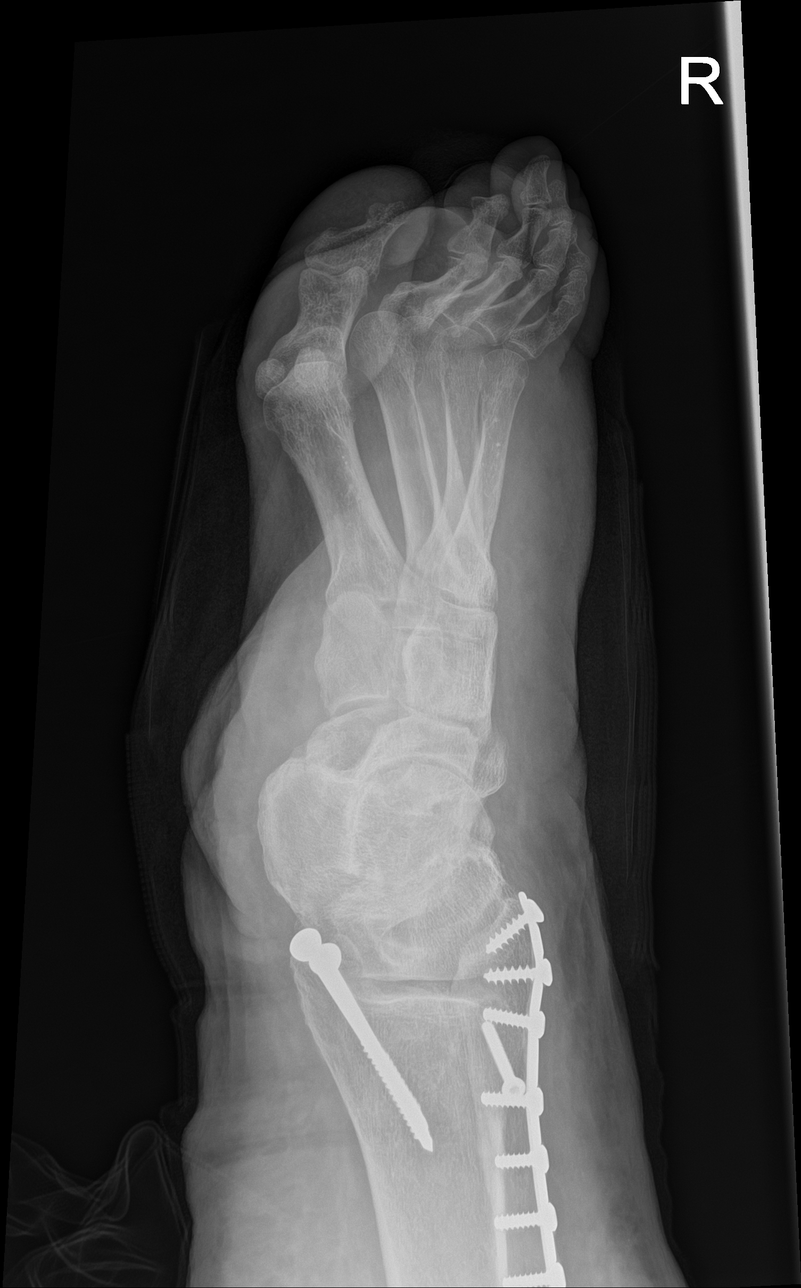

[foot obl (1 of 2)]
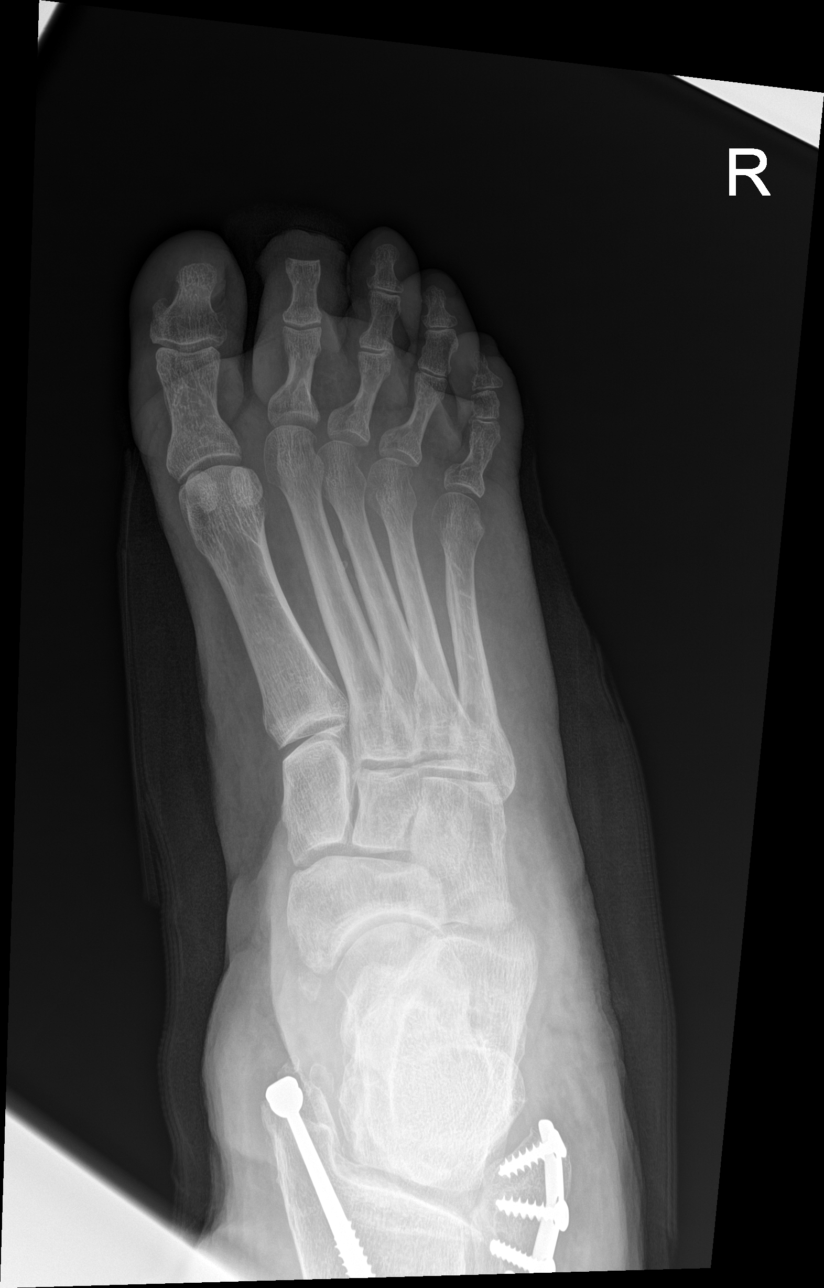

[foot lat]
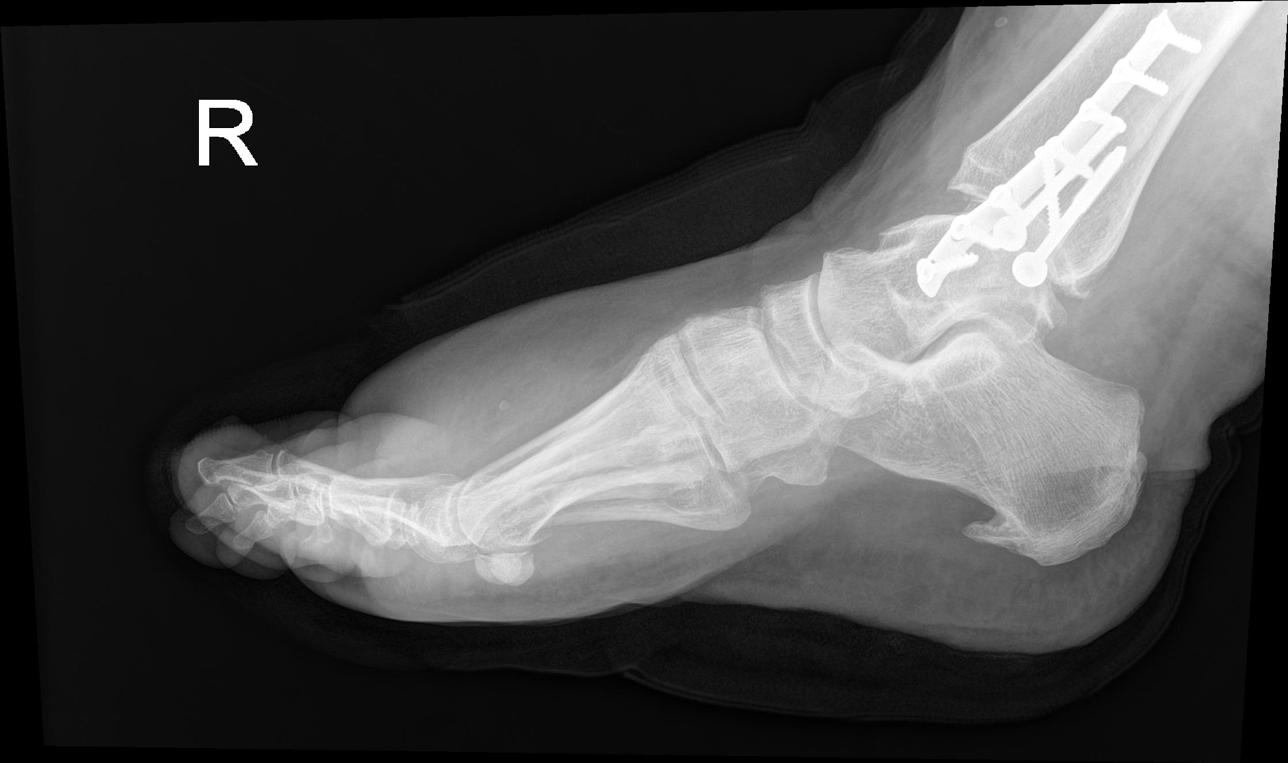

[foot obl (2 of 2)]
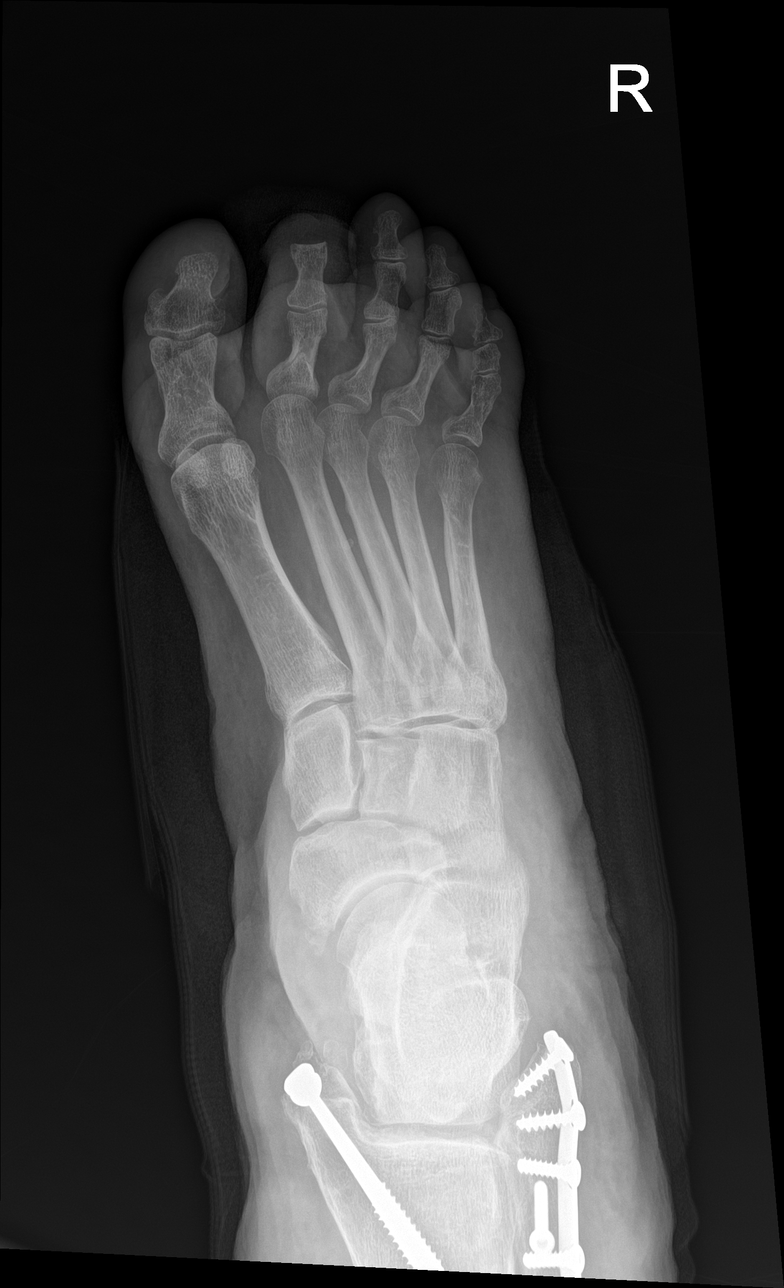

[4 of 4 positions shown; findings below may reference images not displayed]

FINDINGS: Amputation of the distal phalanx second digit noted. Overlying
bandaging noted. No complicating feature is observed.

Postoperative findings along the medial and lateral malleoli.
Plantar and Achilles calcaneal spurs.

Substantial dorsal subcutaneous edema in the foot, increased from
01/25/2021.
IMPRESSION: 1. Interval amputation of the distal phalanx second toe.
2. Considerable dorsal subcutaneous edema in the forefoot, increased
from 01/25/2021.

## 2023-01-11 LAB — AEROBIC CULTURE W GRAM STAIN (SUPERFICIAL SPECIMEN): Gram Stain: NONE SEEN

## 2023-01-15 ENCOUNTER — Encounter (HOSPITAL_BASED_OUTPATIENT_CLINIC_OR_DEPARTMENT_OTHER): Payer: No Typology Code available for payment source | Admitting: Physician Assistant

## 2023-01-15 DIAGNOSIS — E11621 Type 2 diabetes mellitus with foot ulcer: Secondary | ICD-10-CM | POA: Diagnosis not present

## 2023-01-15 NOTE — Progress Notes (Addendum)
Wilczynski, Arline [name] ([id number]) [id number] Page 1 of 10 Visit Report for 01/15/2023 Arrival Information Details Patient Name: Date of Service: [name], [name] 01/15/2023 8:45 A M Medical Record Number: [id number] Patient Account Number: [id number] Date of Birth/Sex: Treating RN: [birth year][name] (57 y.o. F) Primary Care [name]: [name] Other Clinician: Referring [name]: Treating [name] Visit Information History Since Last Visit All ordered tests and consults were completed: No Patient Arrived: Ambulatory Added or deleted any medications: No Arrival Time: 08:46 Any new allergies or adverse reactions: No Accompanied By: son Had a fall or experienced change in No Transfer Assistance: None activities of daily living that may affect Patient Identification Verified: Yes risk of falls: Secondary Verification Process Completed: Yes Signs or symptoms of abuse/neglect since last visito No Patient Requires Transmission-Based Precautions: No Hospitalized since last visit: No Patient Has Alerts: Yes Implantable device outside of the clinic excluding No Patient Alerts: PICC in left arm cellular tissue based products placed in the center since last visit: Pain Present Now: Yes Electronic Signature(s) Signed: 01/15/2023 10:32:09 AM By: [name] Entered By: [name] on 01/15/2023 08:46:44 -------------------------------------------------------------------------------- Clinic Level of Care Assessment Details Patient Name: Date of Service: [name], [name] 01/15/2023 8:45 A M Medical Record Number: [id number] Patient Account Number: [id number] Date of Birth/Sex: Treating RN: [name] (57 y.o. [name], [name] Primary Care [name]: [name] Other Clinician: Referring [name]: Treating [name]/Extender: [name] in Treatment: [name] Level of Care Assessment Items TOOL 4  Quantity Score X- 1 0 Use when only an EandM is performed on FOLLOW-UP visit ASSESSMENTS - Nursing Assessment / Reassessment X- 1 10 Reassessment of Co-morbidities (includes updates in patient status) X- 1 5 Reassessment of Adherence to Treatment Plan ASSESSMENTS - Wound and Skin A ssessment / Reassessment []  - 0 Simple Wound Assessment / Reassessment - one wound X- 3 5 Complex Wound Assessment / Reassessment - multiple wounds []  - 0 Dermatologic / Skin Assessment (not related to wound area) ASSESSMENTS - Focused Assessment X- 1 5 Circumferential Edema Measurements - multi extremities []  - 0 Nutritional Assessment / Counseling / Intervention []  - 0 Lower Extremity Assessment (monofilament, tuning fork, pulses) []  - 0 Peripheral Arterial Disease Assessment (using hand held doppler) ASSESSMENTS - Ostomy and/or Continence Assessment and Care []  - 0 Incontinence Assessment and Management []  - 0 Ostomy Care Assessment and Management (repouching, etc.) PROCESS - Coordination of Care []  - 0 Simple Patient / Family Education for ongoing care [name] ([id number]) [id number] Page 2 of 10 X- 1 20 Complex (extensive) Patient / Family Education for ongoing care X- 1 10 Staff obtains Consents, Records, T Results / Process Orders est []  - 0 Staff telephones HHA, Nursing Homes / Clarify orders / etc []  - 0 Routine Transfer to another Facility (non-emergent condition) []  - 0 Routine Hospital Admission (non-emergent condition) []  - 0 New Admissions / Manufacturing engineer / Ordering NPWT Apligraf, etc. , []  - 0 Emergency Hospital Admission (emergent condition) []  - 0 Simple Discharge Coordination X- 1 15 Complex (extensive) Discharge Coordination PROCESS - Special Needs []  - 0 Pediatric / Minor Patient Management []  - 0 Isolation Patient Management []  - 0 Hearing / Language / Visual special needs []  - 0 Assessment of Community assistance  (transportation, D/C planning, etc.) []  - 0 Additional assistance / Altered mentation []  - 0 Support Surface(s) Assessment (bed, cushion, seat, etc.) INTERVENTIONS - Wound Cleansing / Measurement []  - 0 Simple Wound Cleansing -  one wound X- 3 5 Complex Wound Cleansing - multiple wounds X- 1 5 Wound Imaging (photographs - any number of wounds) []  - 0 Wound Tracing (instead of photographs) []  - 0 Simple Wound Measurement - one wound X- 3 5 Complex Wound Measurement - multiple wounds INTERVENTIONS - Wound Dressings []  - 0 Small Wound Dressing one or multiple wounds X- 2 15 Medium Wound Dressing one or multiple wounds []  - 0 Large Wound Dressing one or multiple wounds []  - 0 Application of Medications - topical []  - 0 Application of Medications - injection INTERVENTIONS - Miscellaneous []  - 0 External ear exam []  - 0 Specimen Collection (cultures, biopsies, blood, body fluids, etc.) []  - 0 Specimen(s) / Culture(s) sent or taken to Lab for analysis []  - 0 Patient Transfer (multiple staff / Nurse, adult / Similar devices) []  - 0 Simple Staple / Suture removal (25 or less) []  - 0 Complex Staple / Suture removal (26 or more) []  - 0 Hypo / Hyperglycemic Management (close monitor of Blood Glucose) []  - 0 Ankle / Brachial Index (ABI) - do not check if billed separately X- 1 5 Vital Signs Has the patient been seen at the hospital within the last three years: Yes Total Score: 150 Level Of Care: New/Established - Level 4 Electronic Signature(s) Signed: 01/15/2023 4:16:30 PM By: [name] Stall RN, BSN Entered By: [name] on 01/15/2023 16:11:43 [name] ([id number]) [id number] Page 3 of 10 -------------------------------------------------------------------------------- Encounter Discharge Information Details Patient Name: Date of Service: [name] 01/15/2023 8:45 A M Medical Record Number: [id number] Patient Account Number: [id number] Date  of Birth/Sex: Treating RN: [birth year] [name] (57 y.o. [name] Primary Care Brightyn Mozer: [name] Other Clinician: Referring [name]: Treating [place] Hedman/Extender: [name] in Treatment: 26 Encounter Discharge Information Items Post Procedure Vitals Discharge Condition: Stable Temperature (F): 98.2 Ambulatory Status: Ambulatory Pulse (bpm): 101 Discharge Destination: Home Respiratory Rate (breaths/min): 18 Transportation: Private Auto Blood Pressure (mmHg): 129/83 Accompanied By: son Schedule Follow-up Appointment: Yes Clinical Summary of Care: Patient Declined Electronic Signature(s) Signed: 01/15/2023 4:43:09 PM By: Redmond Pulling RN, BSN Entered By: Redmond Pulling on 01/15/2023 12:40:53 -------------------------------------------------------------------------------- Lower Extremity Assessment Details Patient Name: Date of Service: MEIRAV, SAS 01/15/2023 8:45 A M Medical Record Number: [id number] Patient Account Number: [id number] Date of Birth/Sex: Treating RN: [birth year] [name] (57 y.o. [name] Primary Care Kieara Schwark: [name] Other Clinician: Referring Wendal Wilkie: Treating Naethan Bracewell/Extender: [name] in Treatment: [name] Edema Assessment Assessed: [Left: No] [name] Forts: No] Edema: [Left: Ye] [Right: s] Calf Left: Right: Point of Measurement: 32 cm From Medial Instep 54.5 cm Ankle Left: Right: Point of Measurement: 10 cm From Medial Instep 34.6 cm Electronic Signature(s) Signed: 01/15/2023 4:43:09 PM By: Redmond Pulling RN, BSN Entered By: Redmond Pulling on 01/15/2023 10:58:00 -------------------------------------------------------------------------------- Multi-Disciplinary Care Plan Details Patient Name: Date of Service: AKERIA, EDLEY 01/15/2023 8:45 A M Medical Record Number: [id number] Patient Account Number: [id number] Date of Birth/Sex: Treating RN: [birth year] (57 y.o. [name] Primary Care Shriyans Kuenzi:  [name] Other Clinician: Referring Raveena Hebdon: Treating Jonhatan Hearty/Extender: [name] in Treatment: [address] [place], [place] ([id number]) [id number] Page 4 of 10 Nursing Diagnoses: Impaired tissue integrity Knowledge deficit related to ulceration/compromised skin integrity Goals: Patient will have a decrease in wound volume by X% from date: (specify in notes) Date Initiated: 06/12/2022 Target Resolution Date: 02/07/2023 Goal Status: Active Patient/caregiver will verbalize understanding of skin care regimen Date Initiated: 06/12/2022 Target Resolution Date: 02/07/2023  Goal Status: Active Ulcer/skin breakdown will have a volume reduction of 30% by week 4 Date Initiated: 06/12/2022 Date Inactivated: 07/31/2022 Target Resolution Date: 08/03/2022 Goal Status: Unmet Unmet Reason: dx with osteomyelitis. Ulcer/skin breakdown will have a volume reduction of 50% by week 8 Date Initiated: 06/12/2022 Date Inactivated: 10/16/2022 Target Resolution Date: 10/05/2022 Unmet Reason: see wound Goal Status: Unmet measurement. IV antibiotics Interventions: Assess patient/caregiver ability to obtain necessary supplies Assess patient/caregiver ability to perform ulcer/skin care regimen upon admission and as needed Assess ulceration(s) every visit Notes: Electronic Signature(s) Signed: 01/15/2023 4:43:09 PM By: Redmond PullingPalmer, Carrie RN, BSN Entered By: Redmond PullingPalmer, Carrie on 01/15/2023 10:58:22 -------------------------------------------------------------------------------- Pain Assessment Details Patient Name: Date of Service: [name], [name] 01/15/2023 8:45 A M Medical Record Number: [id number] Patient Account Number: [id number] Date of Birth/Sex: Treating RN: [birth year][name] (57 y.o. F) Primary Care Jafeth Mustin: [name] HoppingHasanaj, Xaje Other Clinician: Referring Ceana Fiala: Treating Shearon Clonch/Extender: [name] MontanaStone III, Hoyt Hasanaj, Xaje Weeks in Treatment:  [name] Active Problems Location of Pain Severity and Description of Pain Patient Has Paino Yes Site Locations Rate the pain. Current Pain Level: 4 Worst Pain Level: 10 Least Pain Level: 0 Tolerable Pain Level: 1 Pain Management and Medication Current Pain Management: Electronic Signature(s) Signed: 01/15/2023 10:32:09 AM By: [name] Scrapeoss, Aisha Entered By: [name] Scrapeoss, Aisha on 01/15/2023 08:47:22 [name], [name] ([id number]) [id number] Page 5 of 10 -------------------------------------------------------------------------------- Patient/Caregiver Education Details Patient Name: Date of Service: [name], [name] 4/10/2024andnbsp8:45 A M Medical Record Number: [id number] Patient Account Number: [id number] Date of Birth/Gender: Treating RN: [birth year][name] (57 y.o. [name] GovernF) Palmer, Carrie Primary Care Physician: [name] HoppingHasanaj, Xaje Other Clinician: Referring Physician: Treating Physician/Extender: [name] MontanaStone III, Hoyt Hasanaj, Xaje Weeks in Treatment: 4631 Education Assessment Education Provided To: Patient Education Topics Provided Wound/Skin Impairment: Methods: Explain/Verbal Responses: State content correctly Nash-Finch CompanyElectronic Signature(s) Signed: 01/15/2023 4:43:09 PM By: Redmond PullingPalmer, Carrie RN, BSN Entered By: Redmond PullingPalmer, Carrie on 01/15/2023 10:58:42 -------------------------------------------------------------------------------- Wound Assessment Details Patient Name: Date of Service: [name], [name] 01/15/2023 8:45 A M Medical Record Number: [id number] Patient Account Number: [id number] Date of Birth/Sex: Treating RN: [birth year][name] (57 y.o. F) Primary Care Gatlin Kittell: [name] HoppingHasanaj, Xaje Other Clinician: Referring Galya Dunnigan: Treating Alto Gandolfo/Extender: [name] MontanaStone III, Hoyt Hasanaj, Xaje Weeks in Treatment: [name] Wound Status Wound Number: 1 Primary Etiology: Diabetic Wound/Ulcer of the Lower Extremity Wound Location: Right Calcaneus Wound Status: Open Wounding Event: Gradually Appeared Comorbid History: Hypertension, Type II  Diabetes, Osteomyelitis Date Acquired: 11/07/2020 Weeks Of Treatment: [name] Clustered Wound: Yes Photos Wound Measurements Length: (cm) 1 Width: (cm) 0.5 Depth: (cm) 0.2 Clustered Quantity: 2 Area: (cm) 0.39 Volume: (cm) 0.07 % Reduction in Area: 96.6% % Reduction in Volume: 96.6% Epithelialization: Medium (34-66%) 3 9 Wound Description Classification: Grade 3 Wound Margin: Thickened Oros, Gayanne ([id number]) Exudate Amount: Medium Exudate Type: Serosanguineous Exudate Color: red, brown Foul Odor After Cleansing: No Slough/Fibrino Yes [id number] Page 6 of 10 Wound Bed Granulation Amount: Large (67-100%) Exposed Structure Granulation Quality: Red, Pink Fascia Exposed: No Necrotic Amount: Small (1-33%) Fat Layer (Subcutaneous Tissue) Exposed: Yes Necrotic Quality: Adherent Slough Tendon Exposed: No Muscle Exposed: No Joint Exposed: No Bone Exposed: No Periwound Skin Texture Texture Color No Abnormalities Noted: No No Abnormalities Noted: No Callus: Yes Atrophie Blanche: No Crepitus: No Cyanosis: No Excoriation: No Ecchymosis: No Induration: No Erythema: No Rash: No Hemosiderin Staining: No Scarring: No Mottled: No Pallor: No Moisture Rubor: No No Abnormalities Noted: No Dry / Scaly: Yes Temperature / Pain Maceration: No Temperature: No Abnormality Tenderness on Palpation: Yes Treatment Notes Wound #1 (Calcaneus) Wound Laterality: Right Cleanser Soap and Water Discharge Instruction: May shower  and wash wound with dial antibacterial soap and water prior to dressing change. Wound Cleanser Discharge Instruction: Cleanse the wound with wound cleanser prior to applying a clean dressing using gauze sponges, not tissue or cotton balls. Peri-Wound Care Sween Lotion (Moisturizing lotion) Discharge Instruction: Apply moisturizing lotion as directed Topical Primary Dressing Sorbalgon AG Dressing 2x2 (in/in) Discharge Instruction:  Apply to wound bed as instructed Secondary Dressing ABD Pad, 5x9 Discharge Instruction: Apply over primary dressing as directed. Woven Gauze Sponge, Non-Sterile 4x4 in Discharge Instruction: Apply over primary dressing as directed. heel ortho felt Discharge Instruction: apply in shoe to aid in offloading of heel and wound. Secured With Conforming Stretch Gauze Bandage Roll, Sterile 4x75 (in/in) Discharge Instruction: Secure with stretch gauze as directed. 38M Medipore H Soft Cloth Surgical T ape, 4 x 10 (in/yd) Discharge Instruction: Secure with tape as directed. Compression Wrap Compression Stockings Add-Ons Electronic Signature(s) Signed: 01/15/2023 10:32:09 AM By: [name] Entered By: [name] on 01/15/2023 09:06:02 [name], [name] ([id number]) [id number] Page 7 of 10 -------------------------------------------------------------------------------- Wound Assessment Details Patient Name: Date of Service: [name], [name] 01/15/2023 8:45 A M Medical Record Number: [id number] Patient Account Number: [id number] Date of Birth/Sex: Treating RN: [name][birth year] (57 y.o. F) Primary Care Rhen Kawecki: [name] Other Clinician: Referring Keiley Levey: Treating Soham Hollett/Extender: [name] in Treatment: [name] Wound Status Wound Number: 4R Primary Etiology: Diabetic Wound/Ulcer of the Lower Extremity Wound Location: Right, Distal, Plantar Foot Wound Status: Open Wounding Event: Blister Comorbid History: Hypertension, Type II Diabetes, Osteomyelitis Date Acquired: 11/08/2020 Weeks Of Treatment: 13 Clustered Wound: Yes Photos Wound Measurements Length: (cm) 0.2 Width: (cm) 0.2 Depth: (cm) 0.2 Clustered Quantity: 1 Area: (cm) 0.03 Volume: (cm) 0.00 % Reduction in Area: 98.8% % Reduction in Volume: 98.8% Epithelialization: Large (67-100%) 1 6 Wound Description Classification: Grade 3 Wound Margin: Distinct, outline attached Exudate Amount:  Medium Exudate Type: Serosanguineous Exudate Color: red, brown Foul Odor After Cleansing: No Slough/Fibrino No Wound Bed Granulation Amount: Large (67-100%) Exposed Structure Granulation Quality: Red Fascia Exposed: No Necrotic Amount: None Present (0%) Fat Layer (Subcutaneous Tissue) Exposed: Yes Tendon Exposed: No Muscle Exposed: No Joint Exposed: No Bone Exposed: No Periwound Skin Texture Texture Color No Abnormalities Noted: No No Abnormalities Noted: No Callus: Yes Atrophie Blanche: No Crepitus: No Cyanosis: No Excoriation: No Ecchymosis: No Induration: No Erythema: No Rash: No Hemosiderin Staining: No Scarring: No Mottled: No Pallor: No Moisture Rubor: No No Abnormalities Noted: No Dry / Scaly: Yes Maceration: No Treatment Notes Talavera, Birgit ([id number]) [id number] Page 8 of 10 Wound #4R (Foot) Wound Laterality: Plantar, Right, Distal Cleanser Soap and Water Discharge Instruction: May shower and wash wound with dial antibacterial soap and water prior to dressing change. Wound Cleanser Discharge Instruction: Cleanse the wound with wound cleanser prior to applying a clean dressing using gauze sponges, not tissue or cotton balls. Peri-Wound Care Sween Lotion (Moisturizing lotion) Discharge Instruction: Apply moisturizing lotion as directed Topical Primary Dressing Sorbalgon AG Dressing 2x2 (in/in) Discharge Instruction: Apply to wound bed as instructed Secondary Dressing ABD Pad, 5x9 Discharge Instruction: Apply over primary dressing as directed. Woven Gauze Sponge, Non-Sterile 4x4 in Discharge Instruction: Apply over primary dressing as directed. heel ortho felt Discharge Instruction: apply in shoe to aid in offloading of heel and wound. Secured With Conforming Stretch Gauze Bandage Roll, Sterile 4x75 (in/in) Discharge Instruction: Secure with stretch gauze as directed. 38M Medipore H Soft Cloth Surgical T ape, 4 x 10  (in/yd) Discharge Instruction: Secure with tape as directed. Compression Wrap  Compression Stockings Add-Ons Electronic Signature(s) Signed: 01/15/2023 10:32:09 AM By: [name] Entered By: [name] on 01/15/2023 09:05:35 -------------------------------------------------------------------------------- Wound Assessment Details Patient Name: Date of Service: [name], [name] 01/15/2023 8:45 A M Medical Record Number: [id number] Patient Account Number: [id number] Date of Birth/Sex: Treating RN: [birth year]-Oct-[name] (56 y.o. F) Primary Care Arletha Marschke: [name] Other Clinician: Referring Iona Stay: Treating Meckenzie Balsley/Extender: [name] Weeks in Treatment: [name] Wound Status Wound Number: 5 Primary Etiology: Lymphedema Wound Location: Right, Lateral Lower Leg Secondary Etiology: Pressure Ulcer Wounding Event: Gradually Appeared Wound Status: Open Date Acquired: 07/07/2022 Comorbid History: Hypertension, Type II Diabetes, Osteomyelitis Weeks Of Treatment: 3 Clustered Wound: No Photos Koehn, Patrecia ([id number]) [id number] Page 9 of 10 Wound Measurements Length: (cm) 1.5 Width: (cm) 1.4 Depth: (cm) 0.1 Area: (cm) 1.649 Volume: (cm) 0.165 % Reduction in Area: -7.1% % Reduction in Volume: -7.1% Epithelialization: Small (1-33%) Wound Description Classification: Full Thickness Without Exposed Suppor Wound Margin: Distinct, outline attached Exudate Amount: Medium Exudate Type: Serosanguineous Exudate Color: red, brown t Structures Foul Odor After Cleansing: No Slough/Fibrino Yes Wound Bed Granulation Amount: Large (67-100%) Exposed Structure Granulation Quality: Red Fascia Exposed: No Necrotic Amount: Small (1-33%) Fat Layer (Subcutaneous Tissue) Exposed: Yes Necrotic Quality: Adherent Slough Tendon Exposed: No Muscle Exposed: No Joint Exposed: No Bone Exposed: No Periwound Skin Texture Texture Color No Abnormalities Noted: No No  Abnormalities Noted: No Callus: No Atrophie Blanche: No Crepitus: No Cyanosis: No Excoriation: No Ecchymosis: No Induration: No Erythema: No Rash: No Hemosiderin Staining: Yes Scarring: No Mottled: No Pallor: No Moisture Rubor: No No Abnormalities Noted: No Dry / Scaly: No Temperature / Pain Maceration: No Temperature: Hot Tenderness on Palpation: Yes Treatment Notes Wound #5 (Lower Leg) Wound Laterality: Right, Lateral Cleanser Soap and Water Discharge Instruction: May shower and wash wound with dial antibacterial soap and water prior to dressing change. Wound Cleanser Discharge Instruction: Cleanse the wound with wound cleanser prior to applying a clean dressing using gauze sponges, not tissue or cotton balls. Peri-Wound Care Sween Lotion (Moisturizing lotion) Discharge Instruction: Apply moisturizing lotion as directed Topical Primary Dressing Sorbalgon AG Dressing 2x2 (in/in) Discharge Instruction: Apply to wound bed as instructed Secondary Dressing Zetuvit Plus Silicone Border Dressing 4x4 (in/in) Delisa, Katalyna ([id number]) [id number] Page 10 of 10 Discharge Instruction: Apply silicone border over primary dressing as directed. Secured With Compression Wrap Compression Stockings Add-Ons Electronic Signature(s) Signed: 01/15/2023 10:32:09 AM By: [name] Entered By: [name] on 01/15/2023 09:04:59 -------------------------------------------------------------------------------- Vitals Details Patient Name: Date of Service: [name], [name] 01/15/2023 8:45 A M Medical Record Number: [id number] Patient Account Number: [id number] Date of Birth/Sex: Treating RN: [name][birth year] (57 y.o. F) Primary Care [place] Markgraf: [name] Other Clinician: Referring Chaim Gatley: Treating Gwenetta Devos/Extender: [name] in Treatment: [name] Vital Signs Time Taken: 08:46 Temperature (F): 98.2 Height (in): 65 Pulse (bpm): 101 Weight  (lbs): 331 Respiratory Rate (breaths/min): 18 Body Mass Index (BMI): 55.1 Blood Pressure (mmHg): 129/83 Reference Range: 80 - 120 mg / dl Electronic Signature(s) Signed: 01/15/2023 10:32:09 AM By: [name] Entered By: [name] on 01/15/2023 08:47:08

## 2023-01-15 NOTE — Progress Notes (Signed)
[name] ([id number]) [id number] Page 1 of 10 Visit Report for 01/15/2023 Chief Complaint Document Details Patient Name: Date of Service: [name], [name] 01/15/2023 8:45 A M Medical Record Number: [id number] Patient Account Number: [id number] Date of Birth/Sex: Treating RN: 1965-12-03 (57 y.o. F) Primary Care Provider: [name] Other Clinician: Referring Provider: Treating Provider/Extender: [name] in Treatment: 31 Information Obtained from: Patient Chief Complaint Right heel ulcer Electronic Signature(s) Signed: 01/15/2023 9:57:37 AM By: Allen Derry PA-C Entered By: Allen Derry on 01/15/2023 09:57:37 -------------------------------------------------------------------------------- Debridement Details Patient Name: Date of Service: [name], [name] 01/15/2023 8:45 A M Medical Record Number: [id number] Patient Account Number: [id number] Date of Birth/Sex: Treating RN: 1966/01/18 (57 y.o. Orville Govern Primary Care Provider: [name] Other Clinician: Referring Provider: Treating Provider/Extender: [name] in Treatment: 31 Debridement Performed for Assessment: Wound #5 Right,Lateral Lower Leg Performed By: Clinician Redmond Pulling, RN Debridement Type: Chemical/Enzymatic/Mechanical Agent Used: gauze and wound cleanser Level of Consciousness (Pre-procedure): Awake and Alert Pre-procedure Verification/Time Out No Taken: Pain Control: Lidocaine 4% Topical Solution Instrument: Other : gauze Bleeding: None Procedural Pain: 0 Post Procedural Pain: 0 Response to Treatment: Procedure was tolerated well Level of Consciousness (Post- Awake and Alert procedure): Post Debridement Measurements of Total Wound Length: (cm) 1.5 Width: (cm) 1.4 Depth: (cm) 0.1 Volume: (cm) 0.165 Character of Wound/Ulcer Post Debridement: Improved Post Procedure Diagnosis Same as Pre-procedure Electronic  Signature(s) Signed: 01/15/2023 4:43:09 PM By: Redmond Pulling RN, BSN Signed: 01/15/2023 5:51:12 PM By: Allen Derry PA-C Entered By: Redmond Pulling on 01/15/2023 11:01:55 -------------------------------------------------------------------------------- HPI Details Patient Name: Date of Service: [name] 01/15/2023 8:45 A M Medical Record Number: [id number] Patient Account Number: [id number] [name] ([id number]) [id number] Page 2 of 10 Date of Birth/Sex: Treating RN: 1966-04-04 (57 y.o. F) Primary Care Provider: Other Clinician: [name] Referring Provider: Treating Provider/Extender: [name] in Treatment: 31 History of Present Illness HPI Description: 06-13-2022 upon evaluation today patient presents for evaluation of her right heel ulcer. She is having a tremendous amount of pain at this location. She tells me that her most recent hemoglobin A1c was 8.7 and that was on 08-23-2021. This ulcer on the heel has been present she states since February 2023 when she had an injection to the heel by podiatry. She states that she began to have increasing pain the wound opened and has been open ever since. 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 She had an x-ray in June if anything can although there did not appear to be any obvious evidence of osteomyelitis at that point. She subsequently has had OR debridement several times of the wound performed by podiatry. Patient does have a history of lymphedema of the lower extremities she is also a type II diabetic. 06-19-2022 upon evaluation today patient appears to be doing better in regard to the size of her leg which is significantly improved. With that being said she had a lot of drainage from the heel which is completely understandable considering what is going on at this time. There does not appear to be any evidence of active infection there is no warmth or  irritation to the leg in general. With that being said the patient does have an issue here with the amount of drainage that is going on I definitely think Zetuvit is good to be the better way to go in place of ABD pads. Also think that she could potentially benefit from 3  times per week dressing changes but again right now we are going to stick with the to change it today, change in her Friday, and then subsequently seeing where things stand next Wednesday. The other option would be to change her to a different day for wound care having her come then say like a Tuesday Friday or Monday Thursday. 06-26-2022 upon evaluation patient's wound bed actually showed signs of doing well in regard to the overall size it was measuring slightly smaller. With that being said unfortunately the biggest issue we see is she still has a tremendous amount of drainage which is what could keep this from being able to use a total contact cast. For that reason I am did going to discuss with her today the possibility of trying Tubigrip to see if that could be of benefit for her. She voiced understanding and is in agreement with giving this a try. 07-03-2022 upon evaluation today patient unfortunately appears to be doing significantly worse at this point in regard to her swelling due to the fact that she was unable to really wear the Tubigrip. She tells me that it was cutting into her. With that being said she also did have an MRI this MRI revealed that she does have osteomyelitis in the heel this was actually just performed Monday, 25 September. This does show signs of "early osteomyelitis". Nonetheless this is still concerning to me. The wound also appears to be getting deeper in the center aspect of this which has me a little concerned as well. I do believe that she is likely going require an aggressive approach here to try to get things better. I discussed that in greater detail with her today. I will detailed in the  plan. 07-17-2022 upon evaluation today patient appears to be doing poorly in regard to her wound. Unfortunately she does not show any signs of infection systemically though locally this seems to be doing worse with the wound actually being bigger than where it was previous. She did have an MRI that showed evidence of osteomyelitis actually recommended a referral to ID she was evaluated infectious disease and they recommended referral to podiatry and to be honest have recommended amputation based on what the patient tells me today. With that being said this is something that she is not interested in at all to be perfectly honest. For that reason she wants to know what she should do and where she should go at this point that is where the majority of the conversation which was quite lengthy today went. Obviously understand her concern here but I think she is going to have to definitely get off this and likely this means she is going to need to come out of work. 10/18; Since the patient was the patient is using Hydrofera Blue on her right heel wound. She has a new larger open area superior to the wound. The skin on the bottom of her foot is completely macerated. I reviewed the infectious disease note on this patient from 10/9. They recommended keeping the patient on Delofloxacin 450 twice daily until follow-up tomorrow. The patient states she is not going back there as the only thing they wanted to do was "cut my leg off]. She has not seen podiatry. Her lab works shows a CRP markedly elevated at 240.2 a sedimentation rate of 96 the rest of her CBC is normal creatinine of 0.38 The patient has been to see her primary doctor who is Dr. Leonides Schanz [[facility]] In [place]. According the  patient Dr. Loralee Pacas has ordered vancomycin and Zosyn to start on November 1. She is not taking the delofloxacin that was suggested by infectious disease. Very angry that they just wanted to consider her for an amputation although I do not  specifically see that stated in their note 07-31-2022 upon evaluation today patient still has a significant wound over her plantar aspect of her right l foot region. With that being said I am definitely concerned about the fact that the patient probably does need to be in hyperbaric oxygen therapy at this point. I think we should try to get this going as quickly as possible. She actually is going to be set up with a PICC line next Wednesday and then following this we will have the vancomycin and Zosyn that she will be taking for 8 weeks according to what she tells me. I think this is definitely going to be beneficial and again the goal here is limb salvage. In combination with that I think the hyperbaric oxygen therapy would be ultimately significantly helpful for her. 08-07-2022 upon evaluation today patient appears to still be doing quite poorly overall she still has not gotten her antibiotics. She got her PICC line today which I assumed she will be getting the first dose of antibiotics as well during that time. With that being said unfortunately she did not get the antibiotics and in fact as I questioned her further she does not even know when or if that is going to be started this week. Again I am very concerned about this considering she already has a PICC line in place we do not want this to clot off on top of the fact that she should already be on these antibiotics she just never went to the ER for evaluation therefore they never got this started. At this point based on what I am seeing I really think that the ideal thing would be for [place] to see about getting her to the ER for further evaluation and treatment to see if they can get this started for her as soon as possible. The patient voiced understanding and is in agreement with the plan. I gave her recommendations for what should be done also called [facility] infusion therapy in order to see if they would be the ones that seem to be available for her  IV infusions but they tell me that they no longer do this that is apparently where the orders were sent to for her infusion therapy. That was by her primary care provider. 08-21-2022 upon evaluation today patient's wound on the bottom of the heel and foot area as well as the toe appear to be doing significantly better. Fortunately I do not see any evidence of infection locally or systemically and everything is measuring much smaller than where we have been. The wound in the left gluteal region also is dramatically improved compared to last time she was here. She has started the antibiotics this is cefepime and vancomycin. She did have a somewhat high trough and therefore they have her hold that today and they are to recheck in the morning I believe she told me. Nonetheless with the antibiotics going she seems to be doing significantly better which is great news. 08-28-2022 upon evaluation today patient appears to be doing somewhat better in regard to her foot there is definitely less drainage than what we have seen in the past. I do believe the antibiotics are helping. With that being said she is also been approved for  hyperbaric oxygen therapy and I think as soon as we get this started the better. I discussed that with her today as well. With that being said I do think that the last thing she needs is her chest x-ray when she has that done will be ready to start her into treatment. She is going to try to go get that today when she leaves which I think would be ideal. 09-11-2022 upon evaluation today patient appears to be doing well with regard to her foot ulcer. This is showing signs of no digression and she does not appear to be having as much drainage as before though it still very wet is not nearly the extreme of what it was previous. Fortunately there does not appear to be any signs of active infection locally nor systemically at this point which is great news. Patient did have her chest x-ray as  well as EKG and she is actually approved and completely cleared for HBO therapy from both a clinical standpoint as well as an insurance standpoint. 09-25-2022 upon evaluation today patient appears to be doing well in regard to her foot ulcer. Unfortunately she is not doing as well when it comes to her hyperbaric treatments. She is having a lot of trouble with claustrophobia. She tells me in general that she is not sure she is good to be able to continue. Rooke, Arline [name] ([id number]) [id number] Page 3 of 10 10-09-2022 upon evaluation today patient's wounds actually are showing signs of excellent improvement which is great news. Fortunately I do not see any evidence of infection locally or systemically which is great news and overall I am extremely pleased with where things are currently. I do think that she is making good progress she does tell me at this point today she does not want to go forward with a hyperbaric oxygen therapy. 10-16-2022 upon evaluation today patient appears to be doing excellent in regard to her foot ulcer. This is actually showing signs of excellent improvement. She still does not allow for any sharp debridement but the good news is this is doing much better and does not even need it at the moment. Nonetheless as far as sharp debridement is concerned this has been too painful for really not been able to do that all along. 10-23-2022 upon evaluation today patient actually is making excellent progress. I am extremely pleased with where we stand I have considered even seeing about putting her in the total contact cast but to be honest she is doing so well currently and still with having some drainage I feel like it is better for her to be able to change it then to be locked up in the cast for a week at a time. She has voiced understanding as well. Overall though I feel like you are making some really good progress here. 10-30-2022 upon evaluation today patient  appears to be doing excellent in regard to her foot ulcers. Both are showing signs of improvement which is great news and overall I feel like that they are measuring smaller, looking better, and I see no signs of resurgence of the infection which is great news. Overall I am extremely pleased at this point. 2/7; the patient has 2 wounds which are remanence of the large wound on the right foot. This was a diabetic wound with underlying osteomyelitis she has completed antibiotics. Today she has the open area on the distal calcaneus and an area on the right midfoot which is eschared. She has been using silver  alginate and offloading this in a regular running shoe. 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 11-27-2022 upon evaluation today patient appears to be doing well currently in regard to her wound which is actually showing signs of significant improvement this is great news. Fortunately there does not appear to be any signs of active infection locally nor systemically which is also excellent. No fevers, chills, nausea, vomiting, or diarrhea. 12-25-2022 upon evaluation today patient appears to be doing well currently in regard to the distal portion of her foot which is actually in my opinion looking a little better but have a lot of callus she is going require some debridement here. With that being said the heel actually looks like it might be a little bit worse. There is a lot of callus I cannot tell exactly what is open or not we can remove that and we will see what exactly were dealing with there. With that being said she also has an area on the right side of her leg which appears to be more of a pressure injury she tells me this is from sitting in her recliner she has gotten a pillow to help take care of this. Fortunately it is not technically open and draining at this point made it very well may be before it is also not  done. 01-01-2023 upon evaluation today patient appears to be doing well currently in regard to her wounds I feel like the cefdinir has helped she seems to be improved compared to last week's evaluation. I do not see any signs of active infection locally nor systemically at this time which is great news. No fevers, chills, nausea, vomiting, or diarrhea. 01-08-2023 upon evaluation today patient's wounds on the foot appear to be doing decently well the leg is still about the same. With that being said unfortunately she does have a lot of erythema on the leg in particular which is unfortunate and definitely not what we are looking for. Again I do not think that the [facility] has really done well for her in that regard. 01-15-2023 upon evaluation today patient appears to be doing well currently in regard to her wound. Overall I think that things are measuring a little bit better but unfortunately the erythema is spreading. I did review her culture today. Unfortunately it does show that she does have MRSA noted and again I think that we need to do something to try to get this under control as quickly as possible. She has previously been on IV antibiotics and unfortunately that has not been sufficient to get this completely cleared it seems to have come back at this point and she really wants to avoid going back on IV antibiotics. We have been using oral antibiotics and various forms the most recent was actually a course of cefdinir. She is also previously been on clindamycin and even in the past she has had to doxycycline. With that being said with all things considered I think we are still having a lot of trouble here keeping this infection under control. We previously attempted hyperbarics but the patient did not tolerate this well. She therefore discontinued HBO therapy. Electronic Signature(s) Signed: 01/15/2023 5:01:43 PM By: Allen Derry PA-C Entered By: Allen Derry on 01/15/2023  17:01:43 -------------------------------------------------------------------------------- Physical Exam Details Patient Name: Date of Service: [name], [name] 01/15/2023 8:45 A M Medical Record Number: [id number] Patient Account Number: [id number] Date of Birth/Sex: Treating RN: [birth year] (57  y.o. F) Primary Care Provider: [name] Other Clinician: Referring Provider: Treating Provider/Extender: [name] in Treatment: 31 Constitutional Chronically ill appearing but in no apparent acute distress. Respiratory normal breathing without difficulty. Psychiatric this patient is able to make decisions and demonstrates good insight into disease process. Alert and Oriented x 3. pleasant and cooperative. Notes Upon inspection patient's wound actually showed signs of poor healing from the standpoint of the erythema and warmth I think she definitely has evidence of infection here. With that being said the overall size of the wounds do not appear to be doing too bad I am pleased in that regard. Electronic Signature(s) Signed: 01/15/2023 5:02:06 PM By: Allen Derry PA-C Entered By: Allen Derry on 01/15/2023 17:02:06 Davalos, Wing ([id number]) [id number] Page 4 of 10 -------------------------------------------------------------------------------- Physician Orders Details Patient Name: Date of Service: MADDALYNN, BARNARD 01/15/2023 8:45 A M Medical Record Number: [id number] Patient Account Number: [id number] Date of Birth/Sex: Treating RN: [birth year] (57 y.o. Orville Govern Primary Care Provider: [name] Other Clinician: Referring Provider: Treating Provider/Extender: [name] in Treatment: 18 Verbal / Phone Orders: No Diagnosis Coding ICD-10 Coding Code Description [phone] Other chronic osteomyelitis, right ankle and foot E11.621 Type 2 diabetes mellitus with foot ulcer L97.412 Non-pressure chronic ulcer of right  heel and midfoot with fat layer exposed I89.0 Lymphedema, not elsewhere classified Follow-up Appointments ppointment in 1 week. Leonard Schwartz, [place] Wednesday 01/22/23 @ 0845 Return A ppointment in 2 weeks. Allen Derry, [place] Wednesday 01/29/23 @ 0845 Return A Anesthetic (In clinic) Topical Lidocaine 5% applied to wound bed Bathing/ Shower/ Hygiene May shower with protection but do not get wound dressing(s) wet. Protect dressing(s) with water repellant cover (for example, large plastic bag) or a cast cover and may then take shower. Edema Control - Lymphedema / SCD / Other Elevate legs to the level of the heart or above for 30 minutes daily and/or when sitting for 3-4 times a day throughout the day. Avoid standing for long periods of time. Off-Loading Other: - Minimize walking and standing as much as possible to right foot to aid in offloading to wound. While at work use the wheelchair for mobility. felt in shoe to aid in offloading wound and heel. Wound Treatment Wound #1 - Calcaneus Wound Laterality: Right Cleanser: Soap and Water 1 x Per Day/30 Days Discharge Instructions: May shower and wash wound with dial antibacterial soap and water prior to dressing change. Cleanser: Wound Cleanser (Generic) 1 x Per Day/30 Days Discharge Instructions: Cleanse the wound with wound cleanser prior to applying a clean dressing using gauze sponges, not tissue or cotton balls. Peri-Wound Care: Sween Lotion (Moisturizing lotion) 1 x Per Day/30 Days Discharge Instructions: Apply moisturizing lotion as directed Prim Dressing: Sorbalgon AG Dressing 2x2 (in/in) (Generic) 1 x Per Day/30 Days ary Discharge Instructions: Apply to wound bed as instructed Secondary Dressing: ABD Pad, 5x9 (DME) (Generic) 1 x Per Day/30 Days Discharge Instructions: Apply over primary dressing as directed. Secondary Dressing: Woven Gauze Sponge, Non-Sterile 4x4 in (Generic) 1 x Per Day/30 Days Discharge Instructions: Apply over primary  dressing as directed. Secondary Dressing: heel ortho felt 1 x Per Day/30 Days Discharge Instructions: apply in shoe to aid in offloading of heel and wound. Secured With: Web designer, Sterile 4x75 (in/in) (DME) (Generic) 1 x Per Day/30 Days Discharge Instructions: Secure with stretch gauze as directed. Secured With: 24M Medipore H Soft Cloth Surgical T ape, 4 x 10 (in/yd) (Generic)  1 x Per Day/30 Days Discharge Instructions: Secure with tape as directed. Wound #4R - Foot Wound Laterality: Plantar, Right, Distal Cleanser: Soap and Water 1 x Per Day/30 Days Discharge Instructions: May shower and wash wound with dial antibacterial soap and water prior to dressing change. Cleanser: Wound Cleanser (Generic) 1 x Per Day/30 Days Troyer, Jonah ([id number]) [id number] Page 5 of 10 Discharge Instructions: Cleanse the wound with wound cleanser prior to applying a clean dressing using gauze sponges, not tissue or cotton balls. Peri-Wound Care: Sween Lotion (Moisturizing lotion) 1 x Per Day/30 Days Discharge Instructions: Apply moisturizing lotion as directed Prim Dressing: Sorbalgon AG Dressing 2x2 (in/in) (Generic) 1 x Per Day/30 Days ary Discharge Instructions: Apply to wound bed as instructed Secondary Dressing: ABD Pad, 5x9 (DME) (Generic) 1 x Per Day/30 Days Discharge Instructions: Apply over primary dressing as directed. Secondary Dressing: Woven Gauze Sponge, Non-Sterile 4x4 in 1 x Per Day/30 Days Discharge Instructions: Apply over primary dressing as directed. Secondary Dressing: heel ortho felt 1 x Per Day/30 Days Discharge Instructions: apply in shoe to aid in offloading of heel and wound. Secured With: Web designerConforming Stretch Gauze Bandage Roll, Sterile 4x75 (in/in) (DME) (Generic) 1 x Per Day/30 Days Discharge Instructions: Secure with stretch gauze as directed. Secured With: 12M Medipore H Soft Cloth Surgical T ape, 4 x 10 (in/yd) 1 x Per Day/30  Days Discharge Instructions: Secure with tape as directed. Wound #5 - Lower Leg Wound Laterality: Right, Lateral Cleanser: Soap and Water 1 x Per Day/30 Days Discharge Instructions: May shower and wash wound with dial antibacterial soap and water prior to dressing change. Cleanser: Wound Cleanser (Generic) 1 x Per Day/30 Days Discharge Instructions: Cleanse the wound with wound cleanser prior to applying a clean dressing using gauze sponges, not tissue or cotton balls. Peri-Wound Care: Sween Lotion (Moisturizing lotion) 1 x Per Day/30 Days Discharge Instructions: Apply moisturizing lotion as directed Prim Dressing: Sorbalgon AG Dressing 2x2 (in/in) (Generic) 1 x Per Day/30 Days ary Discharge Instructions: Apply to wound bed as instructed Secondary Dressing: Zetuvit Plus Silicone Border Dressing 4x4 (in/in) (DME) (Generic) 1 x Per Day/30 Days Discharge Instructions: Apply silicone border over primary dressing as directed. Patient Medications llergies: No Known Allergies A Notifications Medication Indication Start End 01/15/2023 Nuzyra DOSE 2 - oral 150 mg tablet - 2 tablet oral once daily at bedtime 2 hours after eating Electronic Signature(s) Signed: 01/15/2023 5:03:42 PM By: Allen DerryStone, Braelynn Benning PA-C Previous Signature: 01/15/2023 4:43:09 PM Version By: Redmond PullingPalmer, Carrie RN, BSN Entered By: Allen DerryStone, Maneh Sieben on 01/15/2023 17:03:41 -------------------------------------------------------------------------------- Problem List Details Patient Name: Date of Service: [name], Cattie 01/15/2023 8:45 A M Medical Record Number: [id number] Patient Account Number: [id number] Date of Birth/Sex: Treating RN: 10/31/1965 (57 y.o. F) Primary Care Provider: Lia HoppingHasanaj, Xaje Other Clinician: Referring Provider: Treating Provider/Extender: Laurann MontanaStone III, Jerel Sardina Hasanaj, Xaje Weeks in Treatment: 5631 Active Problems ICD-10 Encounter Code Description Active Date MDM Diagnosis [phone] Other chronic osteomyelitis, right ankle and foot  08/28/2022 No Yes Seago, Elliotte ([id number]) [id number] Page 6 of 10 E11.621 Type 2 diabetes mellitus with foot ulcer 06/12/2022 No Yes L97.412 Non-pressure chronic ulcer of right heel and midfoot with fat layer exposed 06/12/2022 No Yes I89.0 Lymphedema, not elsewhere classified 06/12/2022 No Yes Inactive Problems Resolved Problems Electronic Signature(s) Signed: 01/15/2023 9:57:31 AM By: Allen DerryStone, Felise [place] PA-C Entered By: Allen DerryStone, Mayukha Symmonds on 01/15/2023 09:57:30 -------------------------------------------------------------------------------- Progress Note Details Patient Name: Date of Service: [name], [name] 01/15/2023 8:45 A M Medical Record Number: [id number] Patient Account Number: [id number] Date of Birth/Sex: Treating  RN: 12-01-1965 (57 y.o. F) Primary Care Provider: [name] Other Clinician: Referring Provider: Treating Provider/Extender: [name] in Treatment: 31 Subjective Chief Complaint Information obtained from Patient Right heel ulcer History of Present Illness (HPI) 06-13-2022 upon evaluation today patient presents for evaluation of her right heel ulcer. She is having a tremendous amount of pain at this location. She tells me that her most recent hemoglobin A1c was 8.7 and that was on 08-23-2021. This ulcer on the heel has been present she states since February 2023 when she had an injection to the heel by podiatry. She states that she began to have increasing pain the wound opened and has been open ever since. 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 She had an x-ray in June if anything can although there did not appear to be any obvious evidence of osteomyelitis at that point. She subsequently has had OR debridement several times of the wound performed by podiatry. Patient does have a history of lymphedema of the lower extremities she is also a type II diabetic. 06-19-2022 upon evaluation today patient  appears to be doing better in regard to the size of her leg which is significantly improved. With that being said she had a lot of drainage from the heel which is completely understandable considering what is going on at this time. There does not appear to be any evidence of active infection there is no warmth or irritation to the leg in general. With that being said the patient does have an issue here with the amount of drainage that is going on I definitely think Zetuvit is good to be the better way to go in place of ABD pads. Also think that she could potentially benefit from 3 times per week dressing changes but again right now we are going to stick with the to change it today, change in her Friday, and then subsequently seeing where things stand next Wednesday. The other option would be to change her to a different day for wound care having her come then say like a Tuesday Friday or Monday Thursday. 06-26-2022 upon evaluation patient's wound bed actually showed signs of doing well in regard to the overall size it was measuring slightly smaller. With that being said unfortunately the biggest issue we see is she still has a tremendous amount of drainage which is what could keep this from being able to use a total contact cast. For that reason I am did going to discuss with her today the possibility of trying Tubigrip to see if that could be of benefit for her. She voiced understanding and is in agreement with giving this a try. 07-03-2022 upon evaluation today patient unfortunately appears to be doing significantly worse at this point in regard to her swelling due to the fact that she was unable to really wear the Tubigrip. She tells me that it was cutting into her. With that being said she also did have an MRI this MRI revealed that she does have osteomyelitis in the heel this was actually just performed Monday, 25 September. This does show signs of "early osteomyelitis". Nonetheless this is  still concerning to me. The wound also appears to be getting deeper in the center aspect of this which has me a little concerned as well. I do believe that she is likely going require an aggressive approach here to try to get things better. I discussed that in greater detail with her today. I will detailed  in the plan. 07-17-2022 upon evaluation today patient appears to be doing poorly in regard to her wound. Unfortunately she does not show any signs of infection systemically though locally this seems to be doing worse with the wound actually being bigger than where it was previous. She did have an MRI that showed evidence of osteomyelitis actually recommended a referral to ID she was evaluated infectious disease and they recommended referral to podiatry and to be honest have recommended amputation based on what the patient tells me today. With that being said this is something that she is not interested in at all to be perfectly honest. For that reason she wants to know what she should do and where she should go at this point that is where the majority of the conversation which was quite lengthy today went. Obviously understand her concern here but I think she is going to have to definitely get off this and likely this means she is going to need to come out of work. 10/18; Since the patient was the patient is using Hydrofera Blue on her right heel wound. She has a new larger open area superior to the wound. The skin on the bottom of her foot is completely macerated. Ridinger, Arline [name] ([id number]) [id number] Page 7 of 10 I reviewed the infectious disease note on this patient from 10/9. They recommended keeping the patient on Delofloxacin 450 twice daily until follow-up tomorrow. The patient states she is not going back there as the only thing they wanted to do was "cut my leg off]. She has not seen podiatry. Her lab works shows a CRP markedly elevated at 240.2 a sedimentation  rate of 96 the rest of her CBC is normal creatinine of 0.38 The patient has been to see her primary doctor who is Dr. Leonides Schanz [[facility]] In [place]. According the patient Dr. Loralee Pacas has ordered vancomycin and Zosyn to start on November 1. She is not taking the delofloxacin that was suggested by infectious disease. Very angry that they just wanted to consider her for an amputation although I do not specifically see that stated in their note 07-31-2022 upon evaluation today patient still has a significant wound over her plantar aspect of her right l foot region. With that being said I am definitely concerned about the fact that the patient probably does need to be in hyperbaric oxygen therapy at this point. I think we should try to get this going as quickly as possible. She actually is going to be set up with a PICC line next Wednesday and then following this we will have the vancomycin and Zosyn that she will be taking for 8 weeks according to what she tells me. I think this is definitely going to be beneficial and again the goal here is limb salvage. In combination with that I think the hyperbaric oxygen therapy would be ultimately significantly helpful for her. 08-07-2022 upon evaluation today patient appears to still be doing quite poorly overall she still has not gotten her antibiotics. She got her PICC line today which I assumed she will be getting the first dose of antibiotics as well during that time. With that being said unfortunately she did not get the antibiotics and in fact as I questioned her further she does not even know when or if that is going to be started this week. Again I am very concerned about this considering she already has a PICC line in place we do not want this to clot off on top of the  fact that she should already be on these antibiotics she just never went to the ER for evaluation therefore they never got this started. At this point based on what I am seeing I really think that the  ideal thing would be for [place] to see about getting her to the ER for further evaluation and treatment to see if they can get this started for her as soon as possible. The patient voiced understanding and is in agreement with the plan. I gave her recommendations for what should be done also called [facility] infusion therapy in order to see if they would be the ones that seem to be available for her IV infusions but they tell me that they no longer do this that is apparently where the orders were sent to for her infusion therapy. That was by her primary care provider. 08-21-2022 upon evaluation today patient's wound on the bottom of the heel and foot area as well as the toe appear to be doing significantly better. Fortunately I do not see any evidence of infection locally or systemically and everything is measuring much smaller than where we have been. The wound in the left gluteal region also is dramatically improved compared to last time she was here. She has started the antibiotics this is cefepime and vancomycin. She did have a somewhat high trough and therefore they have her hold that today and they are to recheck in the morning I believe she told me. Nonetheless with the antibiotics going she seems to be doing significantly better which is great news. 08-28-2022 upon evaluation today patient appears to be doing somewhat better in regard to her foot there is definitely less drainage than what we have seen in the past. I do believe the antibiotics are helping. With that being said she is also been approved for hyperbaric oxygen therapy and I think as soon as we get this started the better. I discussed that with her today as well. With that being said I do think that the last thing she needs is her chest x-ray when she has that done will be ready to start her into treatment. She is going to try to go get that today when she leaves which I think would be ideal. 09-11-2022 upon evaluation today patient appears  to be doing well with regard to her foot ulcer. This is showing signs of no digression and she does not appear to be having as much drainage as before though it still very wet is not nearly the extreme of what it was previous. Fortunately there does not appear to be any signs of active infection locally nor systemically at this point which is great news. Patient did have her chest x-ray as well as EKG and she is actually approved and completely cleared for HBO therapy from both a clinical standpoint as well as an insurance standpoint. 09-25-2022 upon evaluation today patient appears to be doing well in regard to her foot ulcer. Unfortunately she is not doing as well when it comes to her hyperbaric treatments. She is having a lot of trouble with claustrophobia. She tells me in general that she is not sure she is good to be able to continue. 10-09-2022 upon evaluation today patient's wounds actually are showing signs of excellent improvement which is great news. Fortunately I do not see any evidence of infection locally or systemically which is great news and overall I am extremely pleased with where things are currently. I do think that she is making good  progress she does tell me at this point today she does not want to go forward with a hyperbaric oxygen therapy. 10-16-2022 upon evaluation today patient appears to be doing excellent in regard to her foot ulcer. This is actually showing signs of excellent improvement. She still does not allow for any sharp debridement but the good news is this is doing much better and does not even need it at the moment. Nonetheless as far as sharp debridement is concerned this has been too painful for really not been able to do that all along. 10-23-2022 upon evaluation today patient actually is making excellent progress. I am extremely pleased with where we stand I have considered even seeing about putting her in the total contact cast but to be honest she is doing so  well currently and still with having some drainage I feel like it is better for her to be able to change it then to be locked up in the cast for a week at a time. She has voiced understanding as well. Overall though I feel like you are making some really good progress here. 10-30-2022 upon evaluation today patient appears to be doing excellent in regard to her foot ulcers. Both are showing signs of improvement which is great news and overall I feel like that they are measuring smaller, looking better, and I see no signs of resurgence of the infection which is great news. Overall I am extremely pleased at this point. 2/7; the patient has 2 wounds which are remanence of the large wound on the right foot. This was a diabetic wound with underlying osteomyelitis she has completed antibiotics. Today she has the open area on the distal calcaneus and an area on the right midfoot which is eschared. She has been using silver alginate and offloading this in a regular running shoe. 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 11-27-2022 upon evaluation today patient appears to be doing well currently in regard to her wound which is actually showing signs of significant improvement this is great news. Fortunately there does not appear to be any signs of active infection locally nor systemically which is also excellent. No fevers, chills, nausea, vomiting, or diarrhea. 12-25-2022 upon evaluation today patient appears to be doing well currently in regard to the distal portion of her foot which is actually in my opinion looking a little better but have a lot of callus she is going require some debridement here. With that being said the heel actually looks like it might be a little bit worse. There is a lot of callus I cannot tell exactly what is open or not we can remove that and we will see what exactly were dealing with there. With that being said she  also has an area on the right side of her leg which appears to be more of a pressure injury she tells me this is from sitting in her recliner she has gotten a pillow to help take care of this. Fortunately it is not technically open and draining at this point made it very well may be before it is also not done. 01-01-2023 upon evaluation today patient appears to be doing well currently in regard to her wounds I feel like the cefdinir has helped she seems to be improved compared to last week's evaluation. I do not see any signs of active infection locally nor systemically at this time which is great news. No fevers,  chills, nausea, vomiting, or diarrhea. 01-08-2023 upon evaluation today patient's wounds on the foot appear to be doing decently well the leg is still about the same. With that being said unfortunately she does have a lot of erythema on the leg in particular which is unfortunate and definitely not what we are looking for. Again I do not think that the [facility] has really done well for her in that regard. 01-15-2023 upon evaluation today patient appears to be doing well currently in regard to her wound. Overall I think that things are measuring a little bit better but unfortunately the erythema is spreading. I did review her culture today. Unfortunately it does show that she does have MRSA noted and again I think that we need to do something to try to get this under control as quickly as possible. She has previously been on IV antibiotics and unfortunately that has not been sufficient to get this completely cleared it seems to have come back at this point and she really wants to avoid going back on IV antibiotics. We have been using oral antibiotics and various forms the most recent was actually a course of cefdinir. She is also previously been on clindamycin and even in the past she has had to doxycycline. With that being said with all things considered I think we are still having a lot of trouble  here keeping this infection under control. We previously attempted hyperbarics but the patient did not tolerate this well. She therefore discontinued HBO therapy. Ring, Arline [name] ([id number]) [id number] Page 8 of 10 Objective Constitutional Chronically ill appearing but in no apparent acute distress. Vitals Time Taken: 8:46 AM, Height: 65 in, Weight: 331 lbs, BMI: 55.1, Temperature: 98.2 F, Pulse: 101 bpm, Respiratory Rate: 18 breaths/min, Blood Pressure: 129/83 mmHg. Respiratory normal breathing without difficulty. Psychiatric this patient is able to make decisions and demonstrates good insight into disease process. Alert and Oriented x 3. pleasant and cooperative. General Notes: Upon inspection patient's wound actually showed signs of poor healing from the standpoint of the erythema and warmth I think she definitely has evidence of infection here. With that being said the overall size of the wounds do not appear to be doing too bad I am pleased in that regard. Integumentary (Hair, Skin) Wound #1 status is Open. Original cause of wound was Gradually Appeared. The date acquired was: 11/07/2020. The wound has been in treatment 31 weeks. The wound is located on the Right Calcaneus. The wound measures 1cm length x 0.5cm width x 0.2cm depth; 0.393cm^2 area and 0.079cm^3 volume. There is Fat Layer (Subcutaneous Tissue) exposed. There is a medium amount of serosanguineous drainage noted. The wound margin is thickened. There is large (67-100%) red, pink granulation within the wound bed. There is a small (1-33%) amount of necrotic tissue within the wound bed including Adherent Slough. The periwound skin appearance exhibited: Callus, Dry/Scaly. The periwound skin appearance did not exhibit: Crepitus, Excoriation, Induration, Rash, Scarring, Maceration, Atrophie Blanche, Cyanosis, Ecchymosis, Hemosiderin Staining, Mottled, Pallor, Rubor, Erythema. Periwound temperature was noted as  No Abnormality. The periwound has tenderness on palpation. Wound #4R status is Open. Original cause of wound was Blister. The date acquired was: 11/08/2020. The wound has been in treatment 13 weeks. The wound is located on the Right,Distal,Plantar Foot. The wound measures 0.2cm length x 0.2cm width x 0.2cm depth; 0.031cm^2 area and 0.006cm^3 volume. There is Fat Layer (Subcutaneous Tissue) exposed. There is a medium amount of serosanguineous drainage noted. The wound margin is distinct with the  outline attached to the wound base. There is large (67-100%) red granulation within the wound bed. There is no necrotic tissue within the wound bed. The periwound skin appearance exhibited: Callus, Dry/Scaly. The periwound skin appearance did not exhibit: Crepitus, Excoriation, Induration, Rash, Scarring, Maceration, Atrophie Blanche, Cyanosis, Ecchymosis, Hemosiderin Staining, Mottled, Pallor, Rubor, Erythema. Wound #5 status is Open. Original cause of wound was Gradually Appeared. The date acquired was: 07/07/2022. The wound has been in treatment 3 weeks. The wound is located on the Right,Lateral Lower Leg. The wound measures 1.5cm length x 1.4cm width x 0.1cm depth; 1.649cm^2 area and 0.165cm^3 volume. There is Fat Layer (Subcutaneous Tissue) exposed. There is a medium amount of serosanguineous drainage noted. The wound margin is distinct with the outline attached to the wound base. There is large (67-100%) red granulation within the wound bed. There is a small (1-33%) amount of necrotic tissue within the wound bed including Adherent Slough. The periwound skin appearance exhibited: Hemosiderin Staining. The periwound skin appearance did not exhibit: Callus, Crepitus, Excoriation, Induration, Rash, Scarring, Dry/Scaly, Maceration, Atrophie Blanche, Cyanosis, Ecchymosis, Mottled, Pallor, Rubor, Erythema. Periwound temperature was noted as Hot. The periwound has tenderness on palpation. Assessment Active  Problems ICD-10 Other chronic osteomyelitis, right ankle and foot Type 2 diabetes mellitus with foot ulcer Non-pressure chronic ulcer of right heel and midfoot with fat layer exposed Lymphedema, not elsewhere classified Procedures Wound #5 Pre-procedure diagnosis of Wound #5 is a Lymphedema located on the Right,Lateral Lower Leg . There was a Chemical/Enzymatic/Mechanical debridement performed by Redmond Pulling, RN. With the following instrument(s): gauze after achieving pain control using Lidocaine 4% Topical Solution. Other agent used was gauze and wound cleanser. There was no bleeding. The procedure was tolerated well with a pain level of 0 throughout and a pain level of 0 following the procedure. Post Debridement Measurements: 1.5cm length x 1.4cm width x 0.1cm depth; 0.165cm^3 volume. Character of Wound/Ulcer Post Debridement is improved. Post procedure Diagnosis Wound #5: Same as Pre-Procedure Plan Follow-up Appointments: Return Appointment in 1 week. Leonard Schwartz, PA Wednesday 01/22/23 @ 0845 Meddaugh, Arline [name] ([id number]) [id number] Page 9 of 10 Return Appointment in 2 weeks. Allen Derry, [place] Wednesday 01/29/23 @ 0845 Anesthetic: (In clinic) Topical Lidocaine 5% applied to wound bed Bathing/ Shower/ Hygiene: May shower with protection but do not get wound dressing(s) wet. Protect dressing(s) with water repellant cover (for example, large plastic bag) or a cast cover and may then take shower. Edema Control - Lymphedema / SCD / Other: Elevate legs to the level of the heart or above for 30 minutes daily and/or when sitting for 3-4 times a day throughout the day. Avoid standing for long periods of time. Off-Loading: Other: - Minimize walking and standing as much as possible to right foot to aid in offloading to wound. While at work use the wheelchair for mobility. felt in shoe to aid in offloading wound and heel. The following medication(s) was prescribed: Nuzyra oral  150 mg tablet 2 2 tablet oral once daily at bedtime 2 hours after eating starting 01/15/2023 WOUND #1: - Calcaneus Wound Laterality: Right Cleanser: Soap and Water 1 x Per Day/30 Days Discharge Instructions: May shower and wash wound with dial antibacterial soap and water prior to dressing change. Cleanser: Wound Cleanser (Generic) 1 x Per Day/30 Days Discharge Instructions: Cleanse the wound with wound cleanser prior to applying a clean dressing using gauze sponges, not tissue or cotton balls. Peri-Wound Care: Sween Lotion (Moisturizing lotion) 1 x Per Day/30 Days Discharge Instructions:  Apply moisturizing lotion as directed Prim Dressing: Sorbalgon AG Dressing 2x2 (in/in) (Generic) 1 x Per Day/30 Days ary Discharge Instructions: Apply to wound bed as instructed Secondary Dressing: ABD Pad, 5x9 (DME) (Generic) 1 x Per Day/30 Days Discharge Instructions: Apply over primary dressing as directed. Secondary Dressing: Woven Gauze Sponge, Non-Sterile 4x4 in (Generic) 1 x Per Day/30 Days Discharge Instructions: Apply over primary dressing as directed. Secondary Dressing: heel ortho felt 1 x Per Day/30 Days Discharge Instructions: apply in shoe to aid in offloading of heel and wound. Secured With: Web designer, Sterile 4x75 (in/in) (DME) (Generic) 1 x Per Day/30 Days Discharge Instructions: Secure with stretch gauze as directed. Secured With: 12M Medipore H Soft Cloth Surgical T ape, 4 x 10 (in/yd) (Generic) 1 x Per Day/30 Days Discharge Instructions: Secure with tape as directed. WOUND #4R: - Foot Wound Laterality: Plantar, Right, Distal Cleanser: Soap and Water 1 x Per Day/30 Days Discharge Instructions: May shower and wash wound with dial antibacterial soap and water prior to dressing change. Cleanser: Wound Cleanser (Generic) 1 x Per Day/30 Days Discharge Instructions: Cleanse the wound with wound cleanser prior to applying a clean dressing using gauze sponges, not tissue  or cotton balls. Peri-Wound Care: Sween Lotion (Moisturizing lotion) 1 x Per Day/30 Days Discharge Instructions: Apply moisturizing lotion as directed Prim Dressing: Sorbalgon AG Dressing 2x2 (in/in) (Generic) 1 x Per Day/30 Days ary Discharge Instructions: Apply to wound bed as instructed Secondary Dressing: ABD Pad, 5x9 (DME) (Generic) 1 x Per Day/30 Days Discharge Instructions: Apply over primary dressing as directed. Secondary Dressing: Woven Gauze Sponge, Non-Sterile 4x4 in 1 x Per Day/30 Days Discharge Instructions: Apply over primary dressing as directed. Secondary Dressing: heel ortho felt 1 x Per Day/30 Days Discharge Instructions: apply in shoe to aid in offloading of heel and wound. Secured With: Web designer, Sterile 4x75 (in/in) (DME) (Generic) 1 x Per Day/30 Days Discharge Instructions: Secure with stretch gauze as directed. Secured With: 12M Medipore H Soft Cloth Surgical T ape, 4 x 10 (in/yd) 1 x Per Day/30 Days Discharge Instructions: Secure with tape as directed. WOUND #5: - Lower Leg Wound Laterality: Right, Lateral Cleanser: Soap and Water 1 x Per Day/30 Days Discharge Instructions: May shower and wash wound with dial antibacterial soap and water prior to dressing change. Cleanser: Wound Cleanser (Generic) 1 x Per Day/30 Days Discharge Instructions: Cleanse the wound with wound cleanser prior to applying a clean dressing using gauze sponges, not tissue or cotton balls. Peri-Wound Care: Sween Lotion (Moisturizing lotion) 1 x Per Day/30 Days Discharge Instructions: Apply moisturizing lotion as directed Prim Dressing: Sorbalgon AG Dressing 2x2 (in/in) (Generic) 1 x Per Day/30 Days ary Discharge Instructions: Apply to wound bed as instructed Secondary Dressing: Zetuvit Plus Silicone Border Dressing 4x4 (in/in) (DME) (Generic) 1 x Per Day/30 Days Discharge Instructions: Apply silicone border over primary dressing as directed. 1. I am going to  recommend based on what I am seeing that we go ahead and initiate treatment with Nuzyra. I would get this sent into the pharmacy for her I did explain that this will be a mail order pharmacy that we will be sending this to. 2. I am going to recommend as well that we have the patient continue with the wound care measures as before. This includes the use of the silver alginate dressing which I think is doing well. 3. I am also going to suggest that we go ahead and have her continue to  elevate her legs much as possible she is also try to stay off of her foot both are equally important. We will see patient back for reevaluation in 1 week here in the clinic. If anything worsens or changes patient will contact our office for additional recommendations. Electronic Signature(s) Signed: 01/15/2023 5:04:20 PM By: Allen Derry PA-C Entered By: Allen Derry on 01/15/2023 17:04:20 -------------------------------------------------------------------------------- SuperBill Details Patient Name: Date of Service: [name], [name] 01/15/2023 Medical Record Number: [id number] Patient Account Number: [id number] BRICEIDA, RASBERRY ([id number]) [id number] Page 10 of 10 Date of Birth/Sex: Treating RN: 16-Nov-1965 (57 y.o. 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 I89.0 Lymphedema, not elsewhere classified Facility Procedures : CPT4 Code: [id number] Description: 9214 - WOUND CARE VISIT-LEV 4 EST PT Modifier: Quantity: 1 Physician Procedures : CPT4 Code Description Modifier [id number] - WC PHYS LEVEL 4 - EST PT ICD-10 Diagnosis  Description M86.671 Other chronic osteomyelitis, right ankle and foot E11.621 Type 2 diabetes mellitus with foot ulcer L97.412 Non-pressure chronic ulcer of right  heel and midfoot with fat layer exposed I89.0 Lymphedema, not elsewhere classified Quantity: 1 Electronic Signature(s) Signed: 01/15/2023 5:04:39 PM By: Allen Derry PA-C Previous Signature: 01/15/2023 4:16:30 PM Version By: Shawn Stall RN, BSN Entered By: Allen Derry on 01/15/2023 17:04:38

## 2023-01-22 ENCOUNTER — Encounter (HOSPITAL_BASED_OUTPATIENT_CLINIC_OR_DEPARTMENT_OTHER): Payer: No Typology Code available for payment source | Admitting: Physician Assistant

## 2023-01-22 DIAGNOSIS — E11621 Type 2 diabetes mellitus with foot ulcer: Secondary | ICD-10-CM | POA: Diagnosis not present

## 2023-01-22 NOTE — Progress Notes (Signed)
Clayton, [name] ([id number]) [id number] Page 1 of 9 Visit Report for 01/22/2023 Chief Complaint Document Details Patient Name: Date of Service: [name], [name] 01/22/2023 8:45 A M Medical Record Number: [id number] Patient Account Number: [id number] Date of Birth/Sex: Treating RN: 07-Oct-1966 (57 y.o. F) Primary Care Provider: [name] Other Clinician: Referring Provider: Treating Provider/Extender: [name] in Treatment: 32 Information Obtained from: Patient Chief Complaint Right heel ulcer Electronic Signature(s) Signed: 01/22/2023 8:56:47 AM By: Allen Derry PA-C Entered By: Allen Derry on 01/22/2023 08:56:47 -------------------------------------------------------------------------------- HPI Details Patient Name: Date of Service: [name], [name] 01/22/2023 8:45 A M Medical Record Number: [id number] Patient Account Number: [id number] Date of Birth/Sex: Treating RN: July 15, 1966 (57 y.o. F) Primary Care Provider: [name] Other Clinician: Referring Provider: Treating Provider/Extender: [name] in Treatment: 32 History of Present Illness HPI Description: 06-13-2022 upon evaluation today patient presents for evaluation of her right heel ulcer. She is having a tremendous amount of pain at this location. She tells me that her most recent hemoglobin A1c was 8.7 and that was on 08-23-2021. This ulcer on the heel has been present she states since February 2023 when she had an injection to the heel by podiatry. She states that she began to have increasing pain the wound opened and has been open ever since. 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 She had an x-ray in June if anything can although there did not appear to be any obvious evidence of osteomyelitis at that point. She subsequently has had OR debridement several times of the wound performed by podiatry. Patient does have a history  of lymphedema of the lower extremities she is also a type II diabetic. 06-19-2022 upon evaluation today patient appears to be doing better in regard to the size of her leg which is significantly improved. With that being said she had a lot of drainage from the heel which is completely understandable considering what is going on at this time. There does not appear to be any evidence of active infection there is no warmth or irritation to the leg in general. With that being said the patient does have an issue here with the amount of drainage that is going on I definitely think Zetuvit is good to be the better way to go in place of ABD pads. Also think that she could potentially benefit from 3 times per week dressing changes but again right now we are going to stick with the to change it today, change in her Friday, and then subsequently seeing where things stand next Wednesday. The other option would be to change her to a different day for wound care having her come then say like a Tuesday Friday or Monday Thursday. 06-26-2022 upon evaluation patient's wound bed actually showed signs of doing well in regard to the overall size it was measuring slightly smaller. With that being said unfortunately the biggest issue we see is she still has a tremendous amount of drainage which is what could keep this from being able to use a total contact cast. For that reason I am did going to discuss with her today the possibility of trying Tubigrip to see if that could be of benefit for her. She voiced understanding and is in agreement with giving this a try. 07-03-2022 upon evaluation today patient unfortunately appears to be doing significantly worse at this point in regard to her swelling due to the fact that she was unable to really wear the  Tubigrip. She tells me that it was cutting into her. With that being said she also did have an MRI this MRI revealed that she does have osteomyelitis in the heel this was actually  just performed Monday, 25 September. This does show signs of "early osteomyelitis". Nonetheless this is still concerning to me. The wound also appears to be getting deeper in the center aspect of this which has me a little concerned as well. I do believe that she is likely going require an aggressive approach here to try to get things better. I discussed that in greater detail with her today. I will detailed in the plan. 07-17-2022 upon evaluation today patient appears to be doing poorly in regard to her wound. Unfortunately she does not show any signs of infection systemically though locally this seems to be doing worse with the wound actually being bigger than where it was previous. She did have an MRI that showed evidence of osteomyelitis actually recommended a referral to ID she was evaluated infectious disease and they recommended referral to podiatry and to be honest have recommended amputation based on what the patient tells me today. With that being said this is something that she is not interested in at all to be perfectly honest. For that reason she wants to know what she should do and where she should go at this point that is where the majority of the conversation which was quite lengthy today went. Obviously understand her concern here but I think she is going to have to definitely get off this and likely this means she is going to need to come out of work. 10/18; Since the patient was the patient is using Hydrofera Blue on her right heel wound. She has a new larger open area superior to the wound. The skin on the bottom of her foot is completely macerated. I reviewed the infectious disease note on this patient from 10/9. They recommended keeping the patient on Delofloxacin 450 twice daily until follow-up tomorrow. The patient states she is not going back there as the only thing they wanted to do was "cut my leg off]. She has not seen podiatry. Her lab works [place], [name] ([id number])  [id number] Page 2 of 9 shows a CRP markedly elevated at 240.2 a sedimentation rate of 96 the rest of her CBC is normal creatinine of 0.38 The patient has been to see her primary doctor who is Dr. Leonides Schanz [SP[facility]] In [place]. According the patient Dr. Loralee Pacas has ordered vancomycin and Zosyn to start on November 1. She is not taking the delofloxacin that was suggested by infectious disease. Very angry that they just wanted to consider her for an amputation although I do not specifically see that stated in their note 07-31-2022 upon evaluation today patient still has a significant wound over her plantar aspect of her right l foot region. With that being said I am definitely concerned about the fact that the patient probably does need to be in hyperbaric oxygen therapy at this point. I think we should try to get this going as quickly as possible. She actually is going to be set up with a PICC line next Wednesday and then following this we will have the vancomycin and Zosyn that she will be taking for 8 weeks according to what she tells me. I think this is definitely going to be beneficial and again the goal here is limb salvage. In combination with that I think the hyperbaric oxygen therapy would be ultimately significantly helpful for  her. 08-07-2022 upon evaluation today patient appears to still be doing quite poorly overall she still has not gotten her antibiotics. She got her PICC line today which I assumed she will be getting the first dose of antibiotics as well during that time. With that being said unfortunately she did not get the antibiotics and in fact as I questioned her further she does not even know when or if that is going to be started this week. Again I am very concerned about this considering she already has a PICC line in place we do not want this to clot off on top of the fact that she should already be on these antibiotics she just never went to the ER  for evaluation therefore they never got this started. At this point based on what I am seeing I really think that the ideal thing would be for [place] to see about getting her to the ER for further evaluation and treatment to see if they can get this started for her as soon as possible. The patient voiced understanding and is in agreement with the plan. I gave her recommendations for what should be done also called [facility] infusion therapy in order to see if they would be the ones that seem to be available for her IV infusions but they tell me that they no longer do this that is apparently where the orders were sent to for her infusion therapy. That was by her primary care provider. 08-21-2022 upon evaluation today patient's wound on the bottom of the heel and foot area as well as the toe appear to be doing significantly better. Fortunately I do not see any evidence of infection locally or systemically and everything is measuring much smaller than where we have been. The wound in the left gluteal region also is dramatically improved compared to last time she was here. She has started the antibiotics this is cefepime and vancomycin. She did have a somewhat high trough and therefore they have her hold that today and they are to recheck in the morning I believe she told me. Nonetheless with the antibiotics going she seems to be doing significantly better which is great news. 08-28-2022 upon evaluation today patient appears to be doing somewhat better in regard to her foot there is definitely less drainage than what we have seen in the past. I do believe the antibiotics are helping. With that being said she is also been approved for hyperbaric oxygen therapy and I think as soon as we get this started the better. I discussed that with her today as well. With that being said I do think that the last thing she needs is her chest x-ray when she has that done will be ready to start her into treatment. She is going to  try to go get that today when she leaves which I think would be ideal. 09-11-2022 upon evaluation today patient appears to be doing well with regard to her foot ulcer. This is showing signs of no digression and she does not appear to be having as much drainage as before though it still very wet is not nearly the extreme of what it was previous. Fortunately there does not appear to be any signs of active infection locally nor systemically at this point which is great news. Patient did have her chest x-ray as well as EKG and she is actually approved and completely cleared for HBO therapy from both a clinical standpoint as well as an insurance standpoint. 09-25-2022 upon  evaluation today patient appears to be doing well in regard to her foot ulcer. Unfortunately she is not doing as well when it comes to her hyperbaric treatments. She is having a lot of trouble with claustrophobia. She tells me in general that she is not sure she is good to be able to continue. 10-09-2022 upon evaluation today patient's wounds actually are showing signs of excellent improvement which is great news. Fortunately I do not see any evidence of infection locally or systemically which is great news and overall I am extremely pleased with where things are currently. I do think that she is making good progress she does tell me at this point today she does not want to go forward with a hyperbaric oxygen therapy. 10-16-2022 upon evaluation today patient appears to be doing excellent in regard to her foot ulcer. This is actually showing signs of excellent improvement. She still does not allow for any sharp debridement but the good news is this is doing much better and does not even need it at the moment. Nonetheless as far as sharp debridement is concerned this has been too painful for really not been able to do that all along. 10-23-2022 upon evaluation today patient actually is making excellent progress. I am extremely pleased with where  we stand I have considered even seeing about putting her in the total contact cast but to be honest she is doing so well currently and still with having some drainage I feel like it is better for her to be able to change it then to be locked up in the cast for a week at a time. She has voiced understanding as well. Overall though I feel like you are making some really good progress here. 10-30-2022 upon evaluation today patient appears to be doing excellent in regard to her foot ulcers. Both are showing signs of improvement which is great news and overall I feel like that they are measuring smaller, looking better, and I see no signs of resurgence of the infection which is great news. Overall I am extremely pleased at this point. 2/7; the patient has 2 wounds which are remanence of the large wound on the right foot. This was a diabetic wound with underlying osteomyelitis she has completed antibiotics. Today she has the open area on the distal calcaneus and an area on the right midfoot which is eschared. She has been using silver alginate and offloading this in a regular running shoe. 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 11-27-2022 upon evaluation today patient appears to be doing well currently in regard to her wound which is actually showing signs of significant improvement this is great news. Fortunately there does not appear to be any signs of active infection locally nor systemically which is also excellent. No fevers, chills, nausea, vomiting, or diarrhea. 12-25-2022 upon evaluation today patient appears to be doing well currently in regard to the distal portion of her foot which is actually in my opinion looking a little better but have a lot of callus she is going require some debridement here. With that being said the heel actually looks like it might be a little bit worse. There is a lot of callus I cannot tell exactly  what is open or not we can remove that and we will see what exactly were dealing with there. With that being said she also has an area on the right side of her leg  which appears to be more of a pressure injury she tells me this is from sitting in her recliner she has gotten a pillow to help take care of this. Fortunately it is not technically open and draining at this point made it very well may be before it is also not done. 01-01-2023 upon evaluation today patient appears to be doing well currently in regard to her wounds I feel like the cefdinir has helped she seems to be improved compared to last week's evaluation. I do not see any signs of active infection locally nor systemically at this time which is great news. No fevers, chills, nausea, vomiting, or diarrhea. 01-08-2023 upon evaluation today patient's wounds on the foot appear to be doing decently well the leg is still about the same. With that being said unfortunately she does have a lot of erythema on the leg in particular which is unfortunate and definitely not what we are looking for. Again I do not think that the [facility] has really done well for her in that regard. 01-15-2023 upon evaluation today patient appears to be doing well currently in regard to her wound. Overall I think that things are measuring a little bit better but unfortunately the erythema is spreading. I did review her culture today. Unfortunately it does show that she does have MRSA noted and again I think that we need to do something to try to get this under control as quickly as possible. She has previously been on IV antibiotics and unfortunately that has not been sufficient to get this completely cleared it seems to have come back at this point and she really wants to avoid going back on IV antibiotics. We have been using oral antibiotics and various forms the most recent was actually a course of cefdinir. She is also previously been on clindamycin and even in the  past she has had to doxycycline. With that being said with all things considered I think we are still having a lot of trouble here keeping this infection under control. We previously attempted hyperbarics but the patient did not tolerate this well. She therefore discontinued HBO therapy. 01-22-2023 upon evaluation today patient appears to be doing a little better in regard to the cellulitis on her right leg. I am much more pleased with what I am seeing today. With that being said unfortunately she is still having quite a bit of issues here with erythema though not as bad I think that were not completely clear. Unfortunately we were denied for the [facility] prescription. She has the medication but I provided for her by way of samples but nothing further. [name], [name] ([id number]) [id number] Page 3 of 9 Electronic Signature(s) Signed: 01/22/2023 2:02:49 PM By: Allen Derry PA-C Entered By: Allen Derry on 01/22/2023 14:02:49 -------------------------------------------------------------------------------- Physical Exam Details Patient Name: Date of Service: [name], [name] 01/22/2023 8:45 A M Medical Record Number: [id number] Patient Account Number: [id number] Date of Birth/Sex: Treating RN: 03-02-1966 (57 y.o. F) Primary Care Provider: [name] Other Clinician: Referring Provider: Treating Provider/Extender: [name] in Treatment: 74 Constitutional Well-nourished and well-hydrated in no acute distress. Respiratory normal breathing without difficulty. Psychiatric this patient is able to make decisions and demonstrates good insight into disease process. Alert and Oriented x 3. pleasant and cooperative. Notes Patient's wound bed actually showed signs of some improvement in all locations but unfortunately the leg still shows some erythema and warmth. This is greatly improved compared to last week but not completely cleared I discussed that  with  her today. I do believe that she would benefit from initiation of care with regard to an additional antibiotic I think linezolid would probably be the way to go. Electronic Signature(s) Signed: 01/22/2023 2:03:20 PM By: Allen Derry PA-C Entered By: Allen Derry on 01/22/2023 14:03:20 -------------------------------------------------------------------------------- Physician Orders Details Patient Name: Date of Service: [name], [name] 01/22/2023 8:45 A M Medical Record Number: [id number] Patient Account Number: [id number] Date of Birth/Sex: Treating RN: 1966/06/14 (57 y.o. [name], Millard.Loa Primary Care Provider: [name] Other Clinician: Referring Provider: Treating Provider/Extender: [name] in Treatment: 37 Verbal / Phone Orders: No Diagnosis Coding ICD-10 Coding Code Description [phone] Other chronic osteomyelitis, right ankle and foot E11.621 Type 2 diabetes mellitus with foot ulcer L97.412 Non-pressure chronic ulcer of right heel and midfoot with fat layer exposed I89.0 Lymphedema, not elsewhere classified Follow-up Appointments ppointment in 1 week. Allen Derry, [place] Wednesday 01/29/23 @ 0845 Return A ppointment in 2 weeks. Allen Derry, [place] Wednesday 02/05/23 @ 0930 Return A Other: - Pick up linezolid from pharmacy. Anesthetic (In clinic) Topical Lidocaine 5% applied to wound bed Bathing/ Shower/ Hygiene May shower with protection but do not get wound dressing(s) wet. Protect dressing(s) with water repellant cover (for example, large plastic bag) or a cast cover and may then take shower. Edema Control - Lymphedema / SCD / Other Elevate legs to the level of the heart or above for 30 minutes daily and/or when sitting for 3-4 times a day throughout the day. Avoid standing for long periods of time. Vecchione, [name] ([id number]) [id number] Page 4 of 9 Off-Loading Other: - Minimize walking and standing as much as possible to right  foot to aid in offloading to wound. While at work use the wheelchair for mobility. felt in shoe to aid in offloading wound and heel. Wound Treatment Wound #1 - Calcaneus Wound Laterality: Right Cleanser: Soap and Water 1 x Per Day/30 Days Discharge Instructions: May shower and wash wound with dial antibacterial soap and water prior to dressing change. Cleanser: Wound Cleanser (Generic) 1 x Per Day/30 Days Discharge Instructions: Cleanse the wound with wound cleanser prior to applying a clean dressing using gauze sponges, not tissue or cotton balls. Peri-Wound Care: Sween Lotion (Moisturizing lotion) 1 x Per Day/30 Days Discharge Instructions: Apply moisturizing lotion as directed Prim Dressing: Sorbalgon AG Dressing 2x2 (in/in) (Generic) 1 x Per Day/30 Days ary Discharge Instructions: Apply to wound bed as instructed Secondary Dressing: ABD Pad, 5x9 (Generic) 1 x Per Day/30 Days Discharge Instructions: Apply over primary dressing as directed. Secondary Dressing: Woven Gauze Sponge, Non-Sterile 4x4 in (Generic) 1 x Per Day/30 Days Discharge Instructions: Apply over primary dressing as directed. Secondary Dressing: heel ortho felt 1 x Per Day/30 Days Discharge Instructions: apply in shoe to aid in offloading of heel and wound. Secured With: Web designer, Sterile 4x75 (in/in) (Generic) 1 x Per Day/30 Days Discharge Instructions: Secure with stretch gauze as directed. Secured With: 39M Medipore H Soft Cloth Surgical T ape, 4 x 10 (in/yd) (Generic) 1 x Per Day/30 Days Discharge Instructions: Secure with tape as directed. Wound #4R - Foot Wound Laterality: Plantar, Right, Distal Cleanser: Soap and Water 1 x Per Day/30 Days Discharge Instructions: May shower and wash wound with dial antibacterial soap and water prior to dressing change. Cleanser: Wound Cleanser (Generic) 1 x Per Day/30 Days Discharge Instructions: Cleanse the wound with wound cleanser prior to applying a  clean dressing using gauze sponges, not tissue  or cotton balls. Peri-Wound Care: Sween Lotion (Moisturizing lotion) 1 x Per Day/30 Days Discharge Instructions: Apply moisturizing lotion as directed Prim Dressing: Sorbalgon AG Dressing 2x2 (in/in) (Generic) 1 x Per Day/30 Days ary Discharge Instructions: Apply to wound bed as instructed Secondary Dressing: ABD Pad, 5x9 (Generic) 1 x Per Day/30 Days Discharge Instructions: Apply over primary dressing as directed. Secondary Dressing: Woven Gauze Sponge, Non-Sterile 4x4 in 1 x Per Day/30 Days Discharge Instructions: Apply over primary dressing as directed. Secondary Dressing: heel ortho felt 1 x Per Day/30 Days Discharge Instructions: apply in shoe to aid in offloading of heel and wound. Secured With: Web designer, Sterile 4x75 (in/in) (Generic) 1 x Per Day/30 Days Discharge Instructions: Secure with stretch gauze as directed. Secured With: 66M Medipore H Soft Cloth Surgical T ape, 4 x 10 (in/yd) 1 x Per Day/30 Days Discharge Instructions: Secure with tape as directed. Wound #5 - Lower Leg Wound Laterality: Right, Lateral Cleanser: Soap and Water 1 x Per Day/30 Days Discharge Instructions: May shower and wash wound with dial antibacterial soap and water prior to dressing change. Cleanser: Wound Cleanser (Generic) 1 x Per Day/30 Days Discharge Instructions: Cleanse the wound with wound cleanser prior to applying a clean dressing using gauze sponges, not tissue or cotton balls. Peri-Wound Care: Sween Lotion (Moisturizing lotion) 1 x Per Day/30 Days Discharge Instructions: Apply moisturizing lotion as directed Prim Dressing: Sorbalgon AG Dressing 2x2 (in/in) (Generic) 1 x Per Day/30 Days ary Discharge Instructions: Apply to wound bed as instructed Secondary Dressing: Zetuvit Plus Silicone Border Dressing 4x4 (in/in) (Generic) 1 x Per Day/30 Days Discharge Instructions: Apply silicone border over primary dressing as  directed. Dumlao, [name] ([id number]) [id number] Page 5 of 9 Patient Medications llergies: No Known Allergies A Notifications Medication Indication Start End 01/22/2023 linezolid DOSE 1 - oral 600 mg tablet - 1 tablet oral twice a day Electronic Signature(s) Signed: 01/22/2023 9:48:46 AM By: Allen Derry PA-C Entered By: Allen Derry on 01/22/2023 09:48:45 -------------------------------------------------------------------------------- Problem List Details Patient Name: Date of Service: KAMIRYN, BEZANSON 01/22/2023 8:45 A M Medical Record Number: [id number] Patient Account Number: [id number] Date of Birth/Sex: Treating RN: 07-07-1966 (57 y.o. F) Primary Care Provider: [name] Other Clinician: Referring Provider: Treating Provider/Extender: [name] in Treatment: 73 Active Problems ICD-10 Encounter Code Description Active Date MDM Diagnosis [phone] Other chronic osteomyelitis, right ankle and foot 08/28/2022 No Yes E11.621 Type 2 diabetes mellitus with foot ulcer 06/12/2022 No Yes L97.412 Non-pressure chronic ulcer of right heel and midfoot with fat layer exposed 06/12/2022 No Yes I89.0 Lymphedema, not elsewhere classified 06/12/2022 No Yes Inactive Problems Resolved Problems Electronic Signature(s) Signed: 01/22/2023 9:27:00 AM By: Allen Derry PA-C Previous Signature: 01/22/2023 8:56:42 AM Version By: Allen Derry PA-C Entered By: Allen Derry on 01/22/2023 09:27:00 -------------------------------------------------------------------------------- Progress Note Details Patient Name: Date of Service: [name], [name] 01/22/2023 8:45 A M Medical Record Number: [id number] Patient Account Number: [id number] Date of Birth/Sex: Treating RN: [id number] (57 y.o. F) Primary Care Provider: [name] Other Clinician: Referring Provider: Treating Provider/Extender: [name] in Treatment: [address] [place]  ([id number]) [id number] Page 6 of 9 Chief Complaint Information obtained from Patient Right heel ulcer History of Present Illness (HPI) 06-13-2022 upon evaluation today patient presents for evaluation of her right heel ulcer. She is having a tremendous amount of pain at this location. She tells me that her most recent hemoglobin A1c was 8.7 and that was on 08-23-2021. This ulcer on the  heel has been present she states since February 2023 when she had an injection to the heel by podiatry. She states that she began to have increasing pain the wound opened and has been open ever since. 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 She had an x-ray in June if anything can although there did not appear to be any obvious evidence of osteomyelitis at that point. She subsequently has had OR debridement several times of the wound performed by podiatry. Patient does have a history of lymphedema of the lower extremities she is also a type II diabetic. 06-19-2022 upon evaluation today patient appears to be doing better in regard to the size of her leg which is significantly improved. With that being said she had a lot of drainage from the heel which is completely understandable considering what is going on at this time. There does not appear to be any evidence of active infection there is no warmth or irritation to the leg in general. With that being said the patient does have an issue here with the amount of drainage that is going on I definitely think Zetuvit is good to be the better way to go in place of ABD pads. Also think that she could potentially benefit from 3 times per week dressing changes but again right now we are going to stick with the to change it today, change in her Friday, and then subsequently seeing where things stand next Wednesday. The other option would be to change her to a different day for wound care having her come then say like a  Tuesday Friday or Monday Thursday. 06-26-2022 upon evaluation patient's wound bed actually showed signs of doing well in regard to the overall size it was measuring slightly smaller. With that being said unfortunately the biggest issue we see is she still has a tremendous amount of drainage which is what could keep this from being able to use a total contact cast. For that reason I am did going to discuss with her today the possibility of trying Tubigrip to see if that could be of benefit for her. She voiced understanding and is in agreement with giving this a try. 07-03-2022 upon evaluation today patient unfortunately appears to be doing significantly worse at this point in regard to her swelling due to the fact that she was unable to really wear the Tubigrip. She tells me that it was cutting into her. With that being said she also did have an MRI this MRI revealed that she does have osteomyelitis in the heel this was actually just performed Monday, 25 September. This does show signs of "early osteomyelitis". Nonetheless this is still concerning to me. The wound also appears to be getting deeper in the center aspect of this which has me a little concerned as well. I do believe that she is likely going require an aggressive approach here to try to get things better. I discussed that in greater detail with her today. I will detailed in the plan. 07-17-2022 upon evaluation today patient appears to be doing poorly in regard to her wound. Unfortunately she does not show any signs of infection systemically though locally this seems to be doing worse with the wound actually being bigger than where it was previous. She did have an MRI that showed evidence of osteomyelitis actually recommended a referral to ID she was evaluated infectious disease and they recommended referral to podiatry and to be honest have recommended amputation based on what the  patient tells me today. With that being said this is something  that she is not interested in at all to be perfectly honest. For that reason she wants to know what she should do and where she should go at this point that is where the majority of the conversation which was quite lengthy today went. Obviously understand her concern here but I think she is going to have to definitely get off this and likely this means she is going to need to come out of work. 10/18; Since the patient was the patient is using Hydrofera Blue on her right heel wound. She has a new larger open area superior to the wound. The skin on the bottom of her foot is completely macerated. I reviewed the infectious disease note on this patient from 10/9. They recommended keeping the patient on Delofloxacin 450 twice daily until follow-up tomorrow. The patient states she is not going back there as the only thing they wanted to do was "cut my leg off]. She has not seen podiatry. Her lab works shows a CRP markedly elevated at 240.2 a sedimentation rate of 96 the rest of her CBC is normal creatinine of 0.38 The patient has been to see her primary doctor who is Dr. Leonides Schanz [SP[facility]] In [place]. According the patient Dr. Loralee Pacas has ordered vancomycin and Zosyn to start on November 1. She is not taking the delofloxacin that was suggested by infectious disease. Very angry that they just wanted to consider her for an amputation although I do not specifically see that stated in their note 07-31-2022 upon evaluation today patient still has a significant wound over her plantar aspect of her right l foot region. With that being said I am definitely concerned about the fact that the patient probably does need to be in hyperbaric oxygen therapy at this point. I think we should try to get this going as quickly as possible. She actually is going to be set up with a PICC line next Wednesday and then following this we will have the vancomycin and Zosyn that she will be taking for 8 weeks according to what she tells me. I  think this is definitely going to be beneficial and again the goal here is limb salvage. In combination with that I think the hyperbaric oxygen therapy would be ultimately significantly helpful for her. 08-07-2022 upon evaluation today patient appears to still be doing quite poorly overall she still has not gotten her antibiotics. She got her PICC line today which I assumed she will be getting the first dose of antibiotics as well during that time. With that being said unfortunately she did not get the antibiotics and in fact as I questioned her further she does not even know when or if that is going to be started this week. Again I am very concerned about this considering she already has a PICC line in place we do not want this to clot off on top of the fact that she should already be on these antibiotics she just never went to the ER for evaluation therefore they never got this started. At this point based on what I am seeing I really think that the ideal thing would be for [place] to see about getting her to the ER for further evaluation and treatment to see if they can get this started for her as soon as possible. The patient voiced understanding and is in agreement with the plan. I gave her recommendations for what should be done also  called [facility] infusion therapy in order to see if they would be the ones that seem to be available for her IV infusions but they tell me that they no longer do this that is apparently where the orders were sent to for her infusion therapy. That was by her primary care provider. 08-21-2022 upon evaluation today patient's wound on the bottom of the heel and foot area as well as the toe appear to be doing significantly better. Fortunately I do not see any evidence of infection locally or systemically and everything is measuring much smaller than where we have been. The wound in the left gluteal region also is dramatically improved compared to last time she was here. She has  started the antibiotics this is cefepime and vancomycin. She did have a somewhat high trough and therefore they have her hold that today and they are to recheck in the morning I believe she told me. Nonetheless with the antibiotics going she seems to be doing significantly better which is great news. 08-28-2022 upon evaluation today patient appears to be doing somewhat better in regard to her foot there is definitely less drainage than what we have seen in the past. I do believe the antibiotics are helping. With that being said she is also been approved for hyperbaric oxygen therapy and I think as soon as we get this started the better. I discussed that with her today as well. With that being said I do think that the last thing she needs is her chest x-ray when she has that done will be ready to start her into treatment. She is going to try to go get that today when she leaves which I think would be ideal. 09-11-2022 upon evaluation today patient appears to be doing well with regard to her foot ulcer. This is showing signs of no digression and she does not appear to be having as much drainage as before though it still very wet is not nearly the extreme of what it was previous. Fortunately there does not appear to be any signs of active infection locally nor systemically at this point which is great news. Patient did have her chest x-ray as well as EKG and she is actually approved and completely cleared for HBO therapy from both a clinical standpoint as well as an insurance standpoint. 09-25-2022 upon evaluation today patient appears to be doing well in regard to her foot ulcer. Unfortunately she is not doing as well when it comes to her hyperbaric treatments. She is having a lot of trouble with claustrophobia. She tells me in general that she is not sure she is good to be able to continue. 10-09-2022 upon evaluation today patient's wounds actually are showing signs of excellent improvement which is great  news. Fortunately I do not see any evidence of infection locally or systemically which is great news and overall I am extremely pleased with where things are currently. I do think that she is [name], [name] ([id number]) [id number].pdf Page 7 of 9 making good progress she does tell me at this point today she does not want to go forward with a hyperbaric oxygen therapy. 10-16-2022 upon evaluation today patient appears to be doing excellent in regard to her foot ulcer. This is actually showing signs of excellent improvement. She still does not allow for any sharp debridement but the good news is this is doing much better and does not even need it at the moment. Nonetheless as far as sharp debridement is concerned this has been  too painful for really not been able to do that all along. 10-23-2022 upon evaluation today patient actually is making excellent progress. I am extremely pleased with where we stand I have considered even seeing about putting her in the total contact cast but to be honest she is doing so well currently and still with having some drainage I feel like it is better for her to be able to change it then to be locked up in the cast for a week at a time. She has voiced understanding as well. Overall though I feel like you are making some really good progress here. 10-30-2022 upon evaluation today patient appears to be doing excellent in regard to her foot ulcers. Both are showing signs of improvement which is great news and overall I feel like that they are measuring smaller, looking better, and I see no signs of resurgence of the infection which is great news. Overall I am extremely pleased at this point. 2/7; the patient has 2 wounds which are remanence of the large wound on the right foot. This was a diabetic wound with underlying osteomyelitis she has completed antibiotics. Today she has the open area on the distal calcaneus and an area on the right midfoot which is  eschared. She has been using silver alginate and offloading this in a regular running shoe. 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 11-27-2022 upon evaluation today patient appears to be doing well currently in regard to her wound which is actually showing signs of significant improvement this is great news. Fortunately there does not appear to be any signs of active infection locally nor systemically which is also excellent. No fevers, chills, nausea, vomiting, or diarrhea. 12-25-2022 upon evaluation today patient appears to be doing well currently in regard to the distal portion of her foot which is actually in my opinion looking a little better but have a lot of callus she is going require some debridement here. With that being said the heel actually looks like it might be a little bit worse. There is a lot of callus I cannot tell exactly what is open or not we can remove that and we will see what exactly were dealing with there. With that being said she also has an area on the right side of her leg which appears to be more of a pressure injury she tells me this is from sitting in her recliner she has gotten a pillow to help take care of this. Fortunately it is not technically open and draining at this point made it very well may be before it is also not done. 01-01-2023 upon evaluation today patient appears to be doing well currently in regard to her wounds I feel like the cefdinir has helped she seems to be improved compared to last week's evaluation. I do not see any signs of active infection locally nor systemically at this time which is great news. No fevers, chills, nausea, vomiting, or diarrhea. 01-08-2023 upon evaluation today patient's wounds on the foot appear to be doing decently well the leg is still about the same. With that being said unfortunately she does have a lot of erythema on the leg in particular which is  unfortunate and definitely not what we are looking for. Again I do not think that the [facility] has really done well for her in that regard. 01-15-2023 upon evaluation today patient appears to be doing well currently in  regard to her wound. Overall I think that things are measuring a little bit better but unfortunately the erythema is spreading. I did review her culture today. Unfortunately it does show that she does have MRSA noted and again I think that we need to do something to try to get this under control as quickly as possible. She has previously been on IV antibiotics and unfortunately that has not been sufficient to get this completely cleared it seems to have come back at this point and she really wants to avoid going back on IV antibiotics. We have been using oral antibiotics and various forms the most recent was actually a course of cefdinir. She is also previously been on clindamycin and even in the past she has had to doxycycline. With that being said with all things considered I think we are still having a lot of trouble here keeping this infection under control. We previously attempted hyperbarics but the patient did not tolerate this well. She therefore discontinued HBO therapy. 01-22-2023 upon evaluation today patient appears to be doing a little better in regard to the cellulitis on her right leg. I am much more pleased with what I am seeing today. With that being said unfortunately she is still having quite a bit of issues here with erythema though not as bad I think that were not completely clear. Unfortunately we were denied for the [facility] prescription. She has the medication but I provided for her by way of samples but nothing further. Objective Constitutional Well-nourished and well-hydrated in no acute distress. Vitals Time Taken: 8:57 AM, Height: 65 in, Weight: 331 lbs, BMI: 55.1, Temperature: 98.8 F, Pulse: 108 bpm, Respiratory Rate: 20 breaths/min, Blood Pressure: 116/82  mmHg. Respiratory normal breathing without difficulty. Psychiatric this patient is able to make decisions and demonstrates good insight into disease process. Alert and Oriented x 3. pleasant and cooperative. General Notes: Patient's wound bed actually showed signs of some improvement in all locations but unfortunately the leg still shows some erythema and warmth. This is greatly improved compared to last week but not completely cleared I discussed that with her today. I do believe that she would benefit from initiation of care with regard to an additional antibiotic I think linezolid would probably be the way to go. Integumentary (Hair, Skin) Wound #1 status is Open. Original cause of wound was Gradually Appeared. The date acquired was: 11/07/2020. The wound has been in treatment 32 weeks. The wound is located on the Right Calcaneus. The wound measures 0.5cm length x 1.2cm width x 0.6cm depth; 0.471cm^2 area and 0.283cm^3 volume. There is Fat Layer (Subcutaneous Tissue) exposed. There is no tunneling or undermining noted. There is a medium amount of serosanguineous drainage noted. The wound margin is thickened. There is large (67-100%) [name], pink granulation within the wound bed. There is a small (1-33%) amount of necrotic tissue within the wound bed including Adherent Slough. The periwound skin appearance exhibited: Callus, Maceration. The periwound skin appearance did not exhibit: Crepitus, Excoriation, Induration, Rash, Scarring, Dry/Scaly, Atrophie Blanche, Cyanosis, Ecchymosis, Hemosiderin Staining, Mottled, Pallor, Rubor, Erythema. Periwound temperature was noted as No Abnormality. The periwound has tenderness on palpation. Wound #4R status is Open. Original cause of wound was Blister. The date acquired was: 11/08/2020. The wound has been in treatment 14 weeks. The wound is located on the Right,Distal,Plantar Foot. The wound measures 0.3cm length x 0.2cm width x 0.3cm depth; 0.047cm^2 area and  0.014cm^3 volume. There is Fat Layer (Subcutaneous Tissue) exposed. There is no  tunneling or undermining noted. There is a medium amount of serosanguineous drainage noted. The wound margin is distinct with the outline attached to the wound base. There is large (67-100%) [name] granulation within the wound bed. There is no necrotic tissue within [name], [name] ([id number]) [phone].pdf Page 8 of 9 the wound bed. The periwound skin appearance exhibited: Callus, Dry/Scaly. The periwound skin appearance did not exhibit: Crepitus, Excoriation, Induration, Rash, Scarring, Maceration, Atrophie Blanche, Cyanosis, Ecchymosis, Hemosiderin Staining, Mottled, Pallor, Rubor, Erythema. Wound #5 status is Open. Original cause of wound was Gradually Appeared. The date acquired was: 07/07/2022. The wound has been in treatment 4 weeks. The wound is located on the Right,Lateral Lower Leg. The wound measures 1cm length x 0.7cm width x 0.1cm depth; 0.55cm^2 area and 0.055cm^3 volume. There is Fat Layer (Subcutaneous Tissue) exposed. There is no tunneling or undermining noted. There is a medium amount of serosanguineous drainage noted. The wound margin is distinct with the outline attached to the wound base. There is large (67-100%) [name], hyper - granulation within the wound bed. There is a small (1- 33%) amount of necrotic tissue within the wound bed including Adherent Slough. The periwound skin appearance exhibited: Hemosiderin Staining. The periwound skin appearance did not exhibit: Callus, Crepitus, Excoriation, Induration, Rash, Scarring, Dry/Scaly, Maceration, Atrophie Blanche, Cyanosis, Ecchymosis, Mottled, Pallor, Rubor, Erythema. Periwound temperature was noted as Hot. The periwound has tenderness on palpation. Assessment Active Problems ICD-10 Other chronic osteomyelitis, right ankle and foot Type 2 diabetes mellitus with foot ulcer Non-pressure chronic ulcer of right heel and midfoot with  fat layer exposed Lymphedema, not elsewhere classified Plan Follow-up Appointments: Return Appointment in 1 week. Allen Derry, [place] Wednesday 01/29/23 @ 0845 Return Appointment in 2 weeks. Allen Derry, [place] Wednesday 02/05/23 @ 0930 Other: - Pick up linezolid from pharmacy. Anesthetic: (In clinic) Topical Lidocaine 5% applied to wound bed Bathing/ Shower/ Hygiene: May shower with protection but do not get wound dressing(s) wet. Protect dressing(s) with water repellant cover (for example, large plastic bag) or a cast cover and may then take shower. Edema Control - Lymphedema / SCD / Other: Elevate legs to the level of the heart or above for 30 minutes daily and/or when sitting for 3-4 times a day throughout the day. Avoid standing for long periods of time. Off-Loading: Other: - Minimize walking and standing as much as possible to right foot to aid in offloading to wound. While at work use the wheelchair for mobility. felt in shoe to aid in offloading wound and heel. The following medication(s) was prescribed: linezolid oral 600 mg tablet 1 1 tablet oral twice a day starting 01/22/2023 WOUND #1: - Calcaneus Wound Laterality: Right Cleanser: Soap and Water 1 x Per Day/30 Days Discharge Instructions: May shower and wash wound with dial antibacterial soap and water prior to dressing change. Cleanser: Wound Cleanser (Generic) 1 x Per Day/30 Days Discharge Instructions: Cleanse the wound with wound cleanser prior to applying a clean dressing using gauze sponges, not tissue or cotton balls. Peri-Wound Care: Sween Lotion (Moisturizing lotion) 1 x Per Day/30 Days Discharge Instructions: Apply moisturizing lotion as directed Prim Dressing: Sorbalgon AG Dressing 2x2 (in/in) (Generic) 1 x Per Day/30 Days ary Discharge Instructions: Apply to wound bed as instructed Secondary Dressing: ABD Pad, 5x9 (Generic) 1 x Per Day/30 Days Discharge Instructions: Apply over primary dressing as directed. Secondary  Dressing: Woven Gauze Sponge, Non-Sterile 4x4 in (Generic) 1 x Per Day/30 Days Discharge Instructions: Apply over primary dressing as directed. Secondary Dressing:  heel ortho felt 1 x Per Day/30 Days Discharge Instructions: apply in shoe to aid in offloading of heel and wound. Secured With: Web designer, Sterile 4x75 (in/in) (Generic) 1 x Per Day/30 Days Discharge Instructions: Secure with stretch gauze as directed. Secured With: 68M Medipore H Soft Cloth Surgical T ape, 4 x 10 (in/yd) (Generic) 1 x Per Day/30 Days Discharge Instructions: Secure with tape as directed. WOUND #4R: - Foot Wound Laterality: Plantar, Right, Distal Cleanser: Soap and Water 1 x Per Day/30 Days Discharge Instructions: May shower and wash wound with dial antibacterial soap and water prior to dressing change. Cleanser: Wound Cleanser (Generic) 1 x Per Day/30 Days Discharge Instructions: Cleanse the wound with wound cleanser prior to applying a clean dressing using gauze sponges, not tissue or cotton balls. Peri-Wound Care: Sween Lotion (Moisturizing lotion) 1 x Per Day/30 Days Discharge Instructions: Apply moisturizing lotion as directed Prim Dressing: Sorbalgon AG Dressing 2x2 (in/in) (Generic) 1 x Per Day/30 Days ary Discharge Instructions: Apply to wound bed as instructed Secondary Dressing: ABD Pad, 5x9 (Generic) 1 x Per Day/30 Days Discharge Instructions: Apply over primary dressing as directed. Secondary Dressing: Woven Gauze Sponge, Non-Sterile 4x4 in 1 x Per Day/30 Days Discharge Instructions: Apply over primary dressing as directed. Secondary Dressing: heel ortho felt 1 x Per Day/30 Days Discharge Instructions: apply in shoe to aid in offloading of heel and wound. Secured With: Web designer, Sterile 4x75 (in/in) (Generic) 1 x Per Day/30 Days Discharge Instructions: Secure with stretch gauze as directed. Secured With: 68M Medipore H Soft Cloth Surgical T ape, 4  x 10 (in/yd) 1 x Per Day/30 Days Discharge Instructions: Secure with tape as directed. WOUND #5: - Lower Leg Wound Laterality: Right, Lateral Cleanser: Soap and Water 1 x Per Day/30 Days Discharge Instructions: May shower and wash wound with dial antibacterial soap and water prior to dressing change. Cleanser: Wound Cleanser (Generic) 1 x Per Day/30 Days [name], [name] ([id number]) [id number] Page 9 of 9 Discharge Instructions: Cleanse the wound with wound cleanser prior to applying a clean dressing using gauze sponges, not tissue or cotton balls. Peri-Wound Care: Sween Lotion (Moisturizing lotion) 1 x Per Day/30 Days Discharge Instructions: Apply moisturizing lotion as directed Prim Dressing: Sorbalgon AG Dressing 2x2 (in/in) (Generic) 1 x Per Day/30 Days ary Discharge Instructions: Apply to wound bed as instructed Secondary Dressing: Zetuvit Plus Silicone Border Dressing 4x4 (in/in) (Generic) 1 x Per Day/30 Days Discharge Instructions: Apply silicone border over primary dressing as directed. 1. Based on what I am seeing currently and due to the fact that the patient really needs to have some type of antibiotics and we cannot get the [place] approved for her I think linezolid is probably the best option. There is a risk here with regard to serotonin syndrome which I discussed with the patient as well and gave her handout infective symptoms to look out for. With that being said I think that we have got to get this infection under control and if the linezolid is her only option since the [place] was not approved by her insurance I do not know what more to do other than send her for IV antibiotics. We have been down that road before as well. Subsequently we are going to go ahead and give the linezolid to try she is in agreement with this plan. Electronic Signature(s) Signed: 01/22/2023 6:36:20 PM By: Allen Derry PA-C Entered By: Allen Derry on 01/22/2023  18:36:20 -------------------------------------------------------------------------------- SuperBill Details Patient Name:  Date of Service: [name], [name] 01/22/2023 Medical Record Number: [id number] Patient Account Number: [id number] Date of Birth/Sex: Treating RN: 05/22/1966 (57 y.o. [name], Millard.Loa Primary Care Provider: [name] Other Clinician: Referring Provider: Treating Provider/Extender: [name] in Treatment: 32 Diagnosis Coding ICD-10 Codes Code Description [phone] Other chronic osteomyelitis, right ankle and foot E11.621 Type 2 diabetes mellitus with foot ulcer L97.412 Non-pressure chronic ulcer of right heel and midfoot with fat layer exposed I89.0 Lymphedema, not elsewhere classified Facility Procedures : CPT4 Code: [id number] Description: 99214 - WOUND CARE VISIT-LEV 4 EST PT Modifier: Quantity: 1 Physician Procedures : CPT4 Code Description Modifier [id number] 99214 - WC PHYS LEVEL 4 - EST PT ICD-10 Diagnosis Description M86.671 Other chronic osteomyelitis, right ankle and foot E11.621 Type 2 diabetes mellitus with foot ulcer L97.412 Non-pressure chronic ulcer of right  heel and midfoot with fat layer exposed I89.0 Lymphedema, not elsewhere classified Quantity: 1 Electronic Signature(s) Signed: 01/22/2023 6:36:41 PM By: Allen Derry PA-C Entered By: Allen Derry on 01/22/2023 18:36:40

## 2023-01-23 NOTE — Progress Notes (Signed)
[name], [name] ([id number]) [id number] Page 1 of 10 Visit Report for 01/22/2023 Arrival Information Details Patient Name: Date of Service: [name], [name] 01/22/2023 8:45 A M Medical Record Number: [id number] Patient Account Number: [id number] Date of Birth/Sex: Treating RN: [birth year] (57 y.o. [name] Primary Care [name]: [name] Other Clinician: Referring [name]: Treating [name]/Extender: [name] in Treatment: 32 Visit Information History Since Last Visit Added or deleted any medications: No Patient Arrived: Ambulatory Any new allergies or adverse reactions: No Arrival Time: 08:40 Had a fall or experienced change in No Accompanied By: son activities of daily living that may affect Transfer Assistance: None risk of falls: Patient Identification Verified: Yes Signs or symptoms of abuse/neglect since last visito No Secondary Verification Process Completed: Yes Hospitalized since last visit: No Patient Requires Transmission-Based Precautions: No Implantable device outside of the clinic excluding No Patient Has Alerts: Yes cellular tissue based products placed in the center Patient Alerts: PICC in left arm since last visit: Has Dressing in Place as Prescribed: Yes Pain Present Now: Yes Notes per patient insurance denied nyzera abx. Electronic Signature(s) Signed: 01/23/2023 2:41:21 PM By: [name] Stall RN, BSN Entered By: [name] on 01/22/2023 08:57:12 -------------------------------------------------------------------------------- Clinic Level of Care Assessment Details Patient Name: Date of Service: [name], [name] 01/22/2023 8:45 A M Medical Record Number: [id number] Patient Account Number: [id number] Date of Birth/Sex: Treating RN: 12-05-1965 (57 y.o. [name], Millard.Loa Primary Care [name]: [name] Other Clinician: Referring [name]: Treating [name]/Extender: [name]  in Treatment: 32 Clinic Level of Care Assessment Items TOOL 4 Quantity Score X- 1 0 Use when only an EandM is performed on FOLLOW-UP visit ASSESSMENTS - Nursing Assessment / Reassessment X- 1 10 Reassessment of Co-morbidities (includes updates in patient status) X- 1 5 Reassessment of Adherence to Treatment Plan ASSESSMENTS - Wound and Skin A ssessment / Reassessment  - 0 Simple Wound Assessment / Reassessment - one wound X- 3 5 Complex Wound Assessment / Reassessment - multiple wounds X- 1 10 Dermatologic / Skin Assessment (not related to wound area) ASSESSMENTS - Focused Assessment X- 1 5 Circumferential Edema Measurements - multi extremities  - 0 Nutritional Assessment / Counseling / Intervention  - 0 Lower Extremity Assessment (monofilament, tuning fork, pulses)  - 0 Peripheral Arterial Disease Assessment (using hand held doppler) ASSESSMENTS - Ostomy and/or Continence Assessment and Care  - 0 Incontinence Assessment and Management [name], [name] ([id number]) [id number] Page 2 of 10  - 0 Ostomy Care Assessment and Management (repouching, etc.) PROCESS - Coordination of Care  - 0 Simple Patient / Family Education for ongoing care X- 1 20 Complex (extensive) Patient / Family Education for ongoing care X- 1 10 Staff obtains Chiropractor, Records, T Results / Process Orders est  - 0 Staff telephones HHA, Nursing Homes / Clarify orders / etc  - 0 Routine Transfer to another Facility (non-emergent condition)  - 0 Routine Hospital Admission (non-emergent condition)  - 0 New Admissions / Manufacturing engineer / Ordering NPWT Apligraf, etc. ,  - 0 Emergency Hospital Admission (emergent condition)  - 0 Simple Discharge Coordination X- 1 15 Complex (extensive) Discharge Coordination PROCESS - Special Needs  - 0 Pediatric / Minor Patient Management  - 0 Isolation Patient Management  - 0 Hearing / Language /  Visual special needs  - 0 Assessment of Community assistance (transportation, D/C planning, etc.)  - 0 Additional assistance / Altered mentation  - 0 Support Surface(s) Assessment (bed, cushion, seat, etc.) INTERVENTIONS -  Wound Cleansing / Measurement []  - 0 Simple Wound Cleansing - one wound X- 3 5 Complex Wound Cleansing - multiple wounds X- 1 5 Wound Imaging (photographs - any number of wounds) []  - 0 Wound Tracing (instead of photographs) []  - 0 Simple Wound Measurement - one wound X- 3 5 Complex Wound Measurement - multiple wounds INTERVENTIONS - Wound Dressings X - Small Wound Dressing one or multiple wounds 2 10 []  - 0 Medium Wound Dressing one or multiple wounds []  - 0 Large Wound Dressing one or multiple wounds []  - 0 Application of Medications - topical []  - 0 Application of Medications - injection INTERVENTIONS - Miscellaneous []  - 0 External ear exam []  - 0 Specimen Collection (cultures, biopsies, blood, body fluids, etc.) []  - 0 Specimen(s) / Culture(s) sent or taken to Lab for analysis []  - 0 Patient Transfer (multiple staff / Nurse, adult / Similar devices) []  - 0 Simple Staple / Suture removal (25 or less) []  - 0 Complex Staple / Suture removal (26 or more) []  - 0 Hypo / Hyperglycemic Management (close monitor of Blood Glucose) []  - 0 Ankle / Brachial Index (ABI) - do not check if billed separately X- 1 5 Vital Signs Has the patient been seen at the hospital within the last three years: Yes Total Score: 150 Level Of Care: New/Established - Level 4 [name], [name] ([id number]) [id number] Page 3 of 10 Electronic Signature(s) Signed: 01/23/2023 2:41:21 PM By: [name] Stall RN, BSN Entered By: [name] on 01/22/2023 09:39:24 -------------------------------------------------------------------------------- Encounter Discharge Information Details Patient Name: Date of Service: [name], [name] 01/22/2023 8:45 A M Medical  Record Number: [id number] Patient Account Number: [id number] Date of Birth/Sex: Treating RN: [birth year] (57 y.o. [name] Primary Care Kamerin Axford: [name] Other Clinician: Referring Letishia Elliott: Treating Montia Haslip/Extender: [name] in Treatment: 52 Encounter Discharge Information Items Discharge Condition: Stable Ambulatory Status: Ambulatory Discharge Destination: Home Transportation: Private Auto Accompanied By: son Schedule Follow-up Appointment: Yes Clinical Summary of Care: Electronic Signature(s) Signed: 01/23/2023 2:41:21 PM By: [name] Stall RN, BSN Entered By: [name] on 01/22/2023 09:39:50 -------------------------------------------------------------------------------- Lower Extremity Assessment Details Patient Name: Date of Service: [name], [name] 01/22/2023 8:45 A M Medical Record Number: [id number] Patient Account Number: [id number] Date of Birth/Sex: Treating RN: [birth year] (57 y.o. [name] Primary Care Rosabel Sermeno: [name] Other Clinician: Referring Brittin Belnap: Treating Linna Thebeau/Extender: [name] in Treatment: 32 Edema Assessment Assessed: [Left: No] Franne Forts: Yes] Edema: [Left: Ye] [Right: s] Calf Left: Right: Point of Measurement: 32 cm From Medial Instep 51 cm Ankle Left: Right: Point of Measurement: 10 cm From Medial Instep 30 cm Vascular Assessment Pulses: Dorsalis Pedis Palpable: [Right:Yes] Electronic Signature(s) Signed: 01/23/2023 2:41:21 PM By: [name] Stall RN, BSN Entered By: [name] on 01/22/2023 08:58:17 -------------------------------------------------------------------------------- Multi-Disciplinary Care Plan Details Patient Name: Date of Service: [name], [name] 01/22/2023 8:45 A M Medical Record Number: [id number] Patient Account Number: [id number] MARICLE, Javon ([id number]) [id number] Page 4 of 10 Date of Birth/Sex: Treating RN: 07-15-1966  (57 y.o. [name] Primary Care Jaquarious Grey: Other Clinician: Lia [name] Referring Lilibeth Opie: Treating Sherene Plancarte/Extender: [name] in Treatment: 33 Active Inactive Wound/Skin Impairment Nursing Diagnoses: Impaired tissue integrity Knowledge deficit related to ulceration/compromised skin integrity Goals: Patient will have a decrease in wound volume by X% from date: (specify in notes) Date Initiated: 06/12/2022 Target Resolution Date: 02/07/2023 Goal Status: Active Patient/caregiver will verbalize understanding of skin care regimen Date Initiated: 06/12/2022 Target Resolution Date: 02/07/2023  Goal Status: Active Ulcer/skin breakdown will have a volume reduction of 30% by week 4 Date Initiated: 06/12/2022 Date Inactivated: 07/31/2022 Target Resolution Date: 08/03/2022 Goal Status: Unmet Unmet Reason: dx with osteomyelitis. Ulcer/skin breakdown will have a volume reduction of 50% by week 8 Date Initiated: 06/12/2022 Date Inactivated: 10/16/2022 Target Resolution Date: 10/05/2022 Unmet Reason: see wound Goal Status: Unmet measurement. IV antibiotics Interventions: Assess patient/caregiver ability to obtain necessary supplies Assess patient/caregiver ability to perform ulcer/skin care regimen upon admission and as needed Assess ulceration(s) every visit Notes: Electronic Signature(s) Signed: 01/23/2023 2:41:21 PM By: [name] Stall RN, BSN Entered By: [name] on 01/22/2023 09:02:50 -------------------------------------------------------------------------------- Pain Assessment Details Patient Name: Date of Service: [name], [name] 01/22/2023 8:45 A M Medical Record Number: [id number] Patient Account Number: [id number] Date of Birth/Sex: Treating RN: Mar 21, 1966 (57 y.o. [name] Primary Care Tahjir Silveria: [name] Other Clinician: Referring Ameet Sandy: Treating Jebadiah Imperato/Extender: [name] in Treatment: 39 Active  Problems Location of Pain Severity and Description of Pain Patient Has Paino Yes Site Locations Pain Location: Pain in Ulcers Rate the pain. Current Pain Level: 4 [name], [name] ([id number]) [id number] Page 5 of 10 Pain Management and Medication Current Pain Management: Medication: No Cold Application: No Rest: No Massage: No Activity: No T.E.N.S.: No Heat Application: No Leg drop or elevation: No Is the Current Pain Management Adequate: Adequate How does your wound impact your activities of daily livingo Sleep: No Bathing: No Appetite: No Relationship With Others: No Bladder Continence: No Emotions: No Bowel Continence: No Work: No Toileting: No Drive: No Dressing: No Hobbies: No Psychologist, prison and probation services) Signed: 01/23/2023 2:41:21 PM By: [name] Stall RN, BSN Entered By: [name] on 01/22/2023 08:58:03 -------------------------------------------------------------------------------- Patient/Caregiver Education Details Patient Name: Date of Service: [name] 4/17/2024andnbsp8:45 A M Medical Record Number: [id number] Patient Account Number: [id number] Date of Birth/Gender: Treating RN: 1966-07-19 (57 y.o. [name] Primary Care Physician: [name] Other Clinician: Referring Physician: Treating Physician/Extender: [name] in Treatment: 72 Education Assessment Education Provided To: Patient Education Topics Provided Wound/Skin Impairment: Handouts: Caring for Your Ulcer Methods: Explain/Verbal Responses: Reinforcements needed Electronic Signature(s) Signed: 01/23/2023 2:41:21 PM By: [name] Stall RN, BSN Entered By: [name] on 01/22/2023 09:03:02 -------------------------------------------------------------------------------- Wound Assessment Details Patient Name: Date of Service: [name], [name] 01/22/2023 8:45 A M Medical Record Number: [id number] Patient Account Number: [id number] Date  of Birth/Sex: Treating RN: 05/02/1966 (57 y.o. [name] Primary Care Keats Kingry: [name] Other Clinician: Referring Sonya Gunnoe: Treating Andre Gallego/Extender: [name] in Treatment: 32 Wound Status Wound Number: 1 Primary Etiology: Diabetic Wound/Ulcer of the Lower Extremity Wound Location: Right Calcaneus Wound Status: Open Wounding Event: Gradually Appeared Comorbid History: Hypertension, Type II Diabetes, Osteomyelitis Date Acquired: 11/07/2020 Weeks Of Treatment: 32 Clustered Wound: Yes Photos Stribling, Despina ([id number]) [id number] Page 6 of 10 Wound Measurements Length: (cm) Width: (cm) Depth: (cm) Clustered Quantity: Area: (cm) Volume: (cm) 0.5 % Reduction in Area: 95.9% 1.2 % Reduction in Volume: 87.8% 0.6 Epithelialization: Medium (34-66%) 2 Tunneling: No 0.471 Undermining: No 0.283 Wound Description Classification: Grade 3 Wound Margin: Thickened Exudate Amount: Medium Exudate Type: Serosanguineous Exudate Color: red, brown Foul Odor After Cleansing: No Slough/Fibrino Yes Wound Bed Granulation Amount: Large (67-100%) Exposed Structure Granulation Quality: Red, Pink Fascia Exposed: No Necrotic Amount: Small (1-33%) Fat Layer (Subcutaneous Tissue) Exposed: Yes Necrotic Quality: Adherent Slough Tendon Exposed: No Muscle Exposed: No Joint Exposed: No Bone Exposed: No Periwound Skin Texture Texture Color No Abnormalities Noted: No  No Abnormalities Noted: No Callus: Yes Atrophie Blanche: No Crepitus: No Cyanosis: No Excoriation: No Ecchymosis: No Induration: No Erythema: No Rash: No Hemosiderin Staining: No Scarring: No Mottled: No Pallor: No Moisture Rubor: No No Abnormalities Noted: No Dry / Scaly: No Temperature / Pain Maceration: Yes Temperature: No Abnormality Tenderness on Palpation: Yes Treatment Notes Wound #1 (Calcaneus) Wound Laterality: Right Cleanser Soap and  Water Discharge Instruction: May shower and wash wound with dial antibacterial soap and water prior to dressing change. Wound Cleanser Discharge Instruction: Cleanse the wound with wound cleanser prior to applying a clean dressing using gauze sponges, not tissue or cotton balls. Peri-Wound Care Sween Lotion (Moisturizing lotion) Discharge Instruction: Apply moisturizing lotion as directed Topical Primary Dressing Sorbalgon AG Dressing 2x2 (in/in) Discharge Instruction: Apply to wound bed as instructed Secondary Dressing Vanhorne, Alease ([id number]) [id number] Page 7 of 10 ABD Pad, 5x9 Discharge Instruction: Apply over primary dressing as directed. Woven Gauze Sponge, Non-Sterile 4x4 in Discharge Instruction: Apply over primary dressing as directed. heel ortho felt Discharge Instruction: apply in shoe to aid in offloading of heel and wound. Secured With Conforming Stretch Gauze Bandage Roll, Sterile 4x75 (in/in) Discharge Instruction: Secure with stretch gauze as directed. 43M Medipore H Soft Cloth Surgical T ape, 4 x 10 (in/yd) Discharge Instruction: Secure with tape as directed. Compression Wrap Compression Stockings Add-Ons Electronic Signature(s) Signed: 01/23/2023 2:41:21 PM By: [name] Stall RN, BSN Entered By: [name] on 01/22/2023 08:59:42 -------------------------------------------------------------------------------- Wound Assessment Details Patient Name: Date of Service: [name], [name] 01/22/2023 8:45 A M Medical Record Number: [id number] Patient Account Number: [id number] Date of Birth/Sex: Treating RN: [birth year] (57 y.o. [name], Millard.Loa Primary Care Dewaine Morocho: [name] Other Clinician: Referring Riniyah Speich: Treating Theotis Gerdeman/Extender: [name] in Treatment: 32 Wound Status Wound Number: 4R Primary Etiology: Diabetic Wound/Ulcer of the Lower Extremity Wound Location: Right, Distal, Plantar Foot Wound Status:  Open Wounding Event: Blister Comorbid History: Hypertension, Type II Diabetes, Osteomyelitis Date Acquired: 11/08/2020 Weeks Of Treatment: 14 Clustered Wound: Yes Photos Wound Measurements Length: (cm) Width: (cm) Depth: (cm) Clustered Quantity: Area: (cm) Volume: (cm) 0.3 % Reduction in Area: 98.1% 0.2 % Reduction in Volume: 97.2% 0.3 Epithelialization: Large (67-100%) 1 Tunneling: No 0.047 Undermining: No 0.014 Wound Description Classification: Grade 3 Wound Margin: Distinct, outline attached Exudate Amount: Medium Exudate Type: Serosanguineous Exudate Color: red, brown Foul Odor After Cleansing: No Slough/Fibrino No Wound Bed Fildes, Oumou ([id number]) [id number] Page 8 of 10 Granulation Amount: Large (67-100%) Exposed Structure Granulation Quality: Red Fascia Exposed: No Necrotic Amount: None Present (0%) Fat Layer (Subcutaneous Tissue) Exposed: Yes Tendon Exposed: No Muscle Exposed: No Joint Exposed: No Bone Exposed: No Periwound Skin Texture Texture Color No Abnormalities Noted: No No Abnormalities Noted: No Callus: Yes Atrophie Blanche: No Crepitus: No Cyanosis: No Excoriation: No Ecchymosis: No Induration: No Erythema: No Rash: No Hemosiderin Staining: No Scarring: No Mottled: No Pallor: No Moisture Rubor: No No Abnormalities Noted: No Dry / Scaly: Yes Maceration: No Treatment Notes Wound #4R (Foot) Wound Laterality: Plantar, Right, Distal Cleanser Soap and Water Discharge Instruction: May shower and wash wound with dial antibacterial soap and water prior to dressing change. Wound Cleanser Discharge Instruction: Cleanse the wound with wound cleanser prior to applying a clean dressing using gauze sponges, not tissue or cotton balls. Peri-Wound Care Sween Lotion (Moisturizing lotion) Discharge Instruction: Apply moisturizing lotion as directed Topical Primary Dressing Sorbalgon AG Dressing 2x2 (in/in) Discharge  Instruction: Apply to wound bed as instructed Secondary Dressing ABD Pad, 5x9  Discharge Instruction: Apply over primary dressing as directed. Woven Gauze Sponge, Non-Sterile 4x4 in Discharge Instruction: Apply over primary dressing as directed. heel ortho felt Discharge Instruction: apply in shoe to aid in offloading of heel and wound. Secured With Conforming Stretch Gauze Bandage Roll, Sterile 4x75 (in/in) Discharge Instruction: Secure with stretch gauze as directed. 48M Medipore H Soft Cloth Surgical T ape, 4 x 10 (in/yd) Discharge Instruction: Secure with tape as directed. Compression Wrap Compression Stockings Add-Ons Electronic Signature(s) Signed: 01/23/2023 2:41:21 PM By: [name] Stall RN, BSN Entered By: [name] on 01/22/2023 09:00:25 -------------------------------------------------------------------------------- Wound Assessment Details Patient Name: Date of Service: [name], [name] 01/22/2023 8:45 A M Medical Record Number: [id number] Patient Account Number: [id number] [name], BLOCH ([id number]) [id number] Page 9 of 10 Date of Birth/Sex: Treating RN: 03/25/1966 (57 y.o. [name], Millard.Loa Primary Care Lamara Brecht: Other Clinician: Lia [name] Referring Raziya Aveni: Treating Qunicy Higinbotham/Extender: [name] in Treatment: 32 Wound Status Wound Number: 5 Primary Etiology: Lymphedema Wound Location: Right, Lateral Lower Leg Secondary Etiology: Pressure Ulcer Wounding Event: Gradually Appeared Wound Status: Open Date Acquired: 07/07/2022 Comorbid History: Hypertension, Type II Diabetes, Osteomyelitis Weeks Of Treatment: 4 Clustered Wound: No Photos Wound Measurements Length: (cm) 1 Width: (cm) 0.7 Depth: (cm) 0.1 Area: (cm) 0.55 Volume: (cm) 0.055 % Reduction in Area: 64.3% % Reduction in Volume: 64.3% Epithelialization: Small (1-33%) Tunneling: No Undermining: No Wound Description Classification: Full Thickness  Without Exposed Suppo Wound Margin: Distinct, outline attached Exudate Amount: Medium Exudate Type: Serosanguineous Exudate Color: red, brown rt Structures Foul Odor After Cleansing: No Slough/Fibrino Yes Wound Bed Granulation Amount: Large (67-100%) Exposed Structure Granulation Quality: Red, Hyper-granulation Fascia Exposed: No Necrotic Amount: Small (1-33%) Fat Layer (Subcutaneous Tissue) Exposed: Yes Necrotic Quality: Adherent Slough Tendon Exposed: No Muscle Exposed: No Joint Exposed: No Bone Exposed: No Periwound Skin Texture Texture Color No Abnormalities Noted: No No Abnormalities Noted: No Callus: No Atrophie Blanche: No Crepitus: No Cyanosis: No Excoriation: No Ecchymosis: No Induration: No Erythema: No Rash: No Hemosiderin Staining: Yes Scarring: No Mottled: No Pallor: No Moisture Rubor: No No Abnormalities Noted: No Dry / Scaly: No Temperature / Pain Maceration: No Temperature: Hot Tenderness on Palpation: Yes Treatment Notes Wound #5 (Lower Leg) Wound Laterality: Right, Lateral Cleanser Soap and Water Discharge Instruction: May shower and wash wound with dial antibacterial soap and water prior to dressing change. Wound Cleanser Strothers, Sherea ([id number]) [id number] Page 10 of 10 Discharge Instruction: Cleanse the wound with wound cleanser prior to applying a clean dressing using gauze sponges, not tissue or cotton balls. Peri-Wound Care Sween Lotion (Moisturizing lotion) Discharge Instruction: Apply moisturizing lotion as directed Topical Primary Dressing Sorbalgon AG Dressing 2x2 (in/in) Discharge Instruction: Apply to wound bed as instructed Secondary Dressing Zetuvit Plus Silicone Border Dressing 4x4 (in/in) Discharge Instruction: Apply silicone border over primary dressing as directed. Secured With Compression Wrap Compression Stockings Facilities manager) Signed: 01/23/2023 2:41:21 PM By: [name] Stall RN, BSN Entered By: [name] on 01/22/2023 09:00:45 -------------------------------------------------------------------------------- Vitals Details Patient Name: Date of Service: LUANNA, WEESNER 01/22/2023 8:45 A M Medical Record Number: [id number] Patient Account Number: [id number] Date of Birth/Sex: Treating RN: Sep 23, 1966 (57 y.o. [name], Millard.Loa Primary Care Brenner Visconti: [name] Other Clinician: Referring Willadeen Colantuono: Treating Jeana Kersting/Extender: [name] in Treatment: 32 Vital Signs Time Taken: 08:57 Temperature (F): 98.8 Height (in): 65 Pulse (bpm): 108 Weight (lbs): 331 Respiratory Rate (breaths/min): 20 Body Mass Index (BMI): 55.1 Blood Pressure (mmHg): 116/82 Reference Range: 80 - 120 mg /  dl Electronic Signature(s) Signed: 01/23/2023 2:41:21 PM By: [name] Stall RN, BSN Entered By: [name] on 01/22/2023 08:57:31

## 2023-01-29 ENCOUNTER — Encounter (HOSPITAL_BASED_OUTPATIENT_CLINIC_OR_DEPARTMENT_OTHER): Payer: No Typology Code available for payment source | Admitting: Physician Assistant

## 2023-01-29 DIAGNOSIS — E11621 Type 2 diabetes mellitus with foot ulcer: Secondary | ICD-10-CM | POA: Diagnosis not present

## 2023-01-29 NOTE — Progress Notes (Addendum)
Ditmer, [name] ([id number]) [id number] Page 1 of 13 Visit Report for 01/29/2023 Chief Complaint Document Details Patient Name: Date of Service: [name], [name] 01/29/2023 8:45 A M Medical Record Number: [id number] Patient Account Number: [id number] Date of Birth/Sex: Treating RN: April 18, 1966 (57 y.o. F) Primary Care Provider: [name] Other Clinician: Referring Provider: Treating Provider/Extender: [name] in Treatment: 59 Information Obtained from: Patient Chief Complaint Right heel ulcer Electronic Signature(s) Signed: 01/29/2023 9:17:30 AM By: Allen Derry PA-C Entered By: Allen Derry on 01/29/2023 09:17:29 -------------------------------------------------------------------------------- Debridement Details Patient Name: Date of Service: [name], [name] 01/29/2023 8:45 A M Medical Record Number: [id number] Patient Account Number: [id number] Date of Birth/Sex: Treating RN: 04/09/1966 (57 y.o. [name], Millard.Loa Primary Care Provider: [name] Other Clinician: Referring Provider: Treating Provider/Extender: [name] in Treatment: 33 Debridement Performed for Assessment: Wound #1 Right Calcaneus Performed By: Physician Lenda Kelp, PA Debridement Type: Debridement Severity of Tissue Pre Debridement: Fat layer exposed Level of Consciousness (Pre-procedure): Awake and Alert Pre-procedure Verification/Time Out Yes - 09:20 Taken: Start Time: 09:21 Pain Control: Lidocaine 5% topical ointment Percent of Wound Bed Debrided: 100% T Area Debrided (cm): otal 4.71 Tissue and other material debrided: Viable, Non-Viable, Callus, Slough, Subcutaneous, Skin: Dermis , Skin: Epidermis, Biofilm, Slough Level: Skin/Subcutaneous Tissue Debridement Description: Excisional Instrument: Curette Bleeding: None End Time: 09:27 Procedural Pain: 3 Post Procedural Pain: 3 Response to Treatment: Procedure was tolerated  well Level of Consciousness (Post- Awake and Alert procedure): Post Debridement Measurements of Total Wound Length: (cm) 3 Width: (cm) 2 Depth: (cm) 0.4 Volume: (cm) 1.885 Character of Wound/Ulcer Post Debridement: Improved Severity of Tissue Post Debridement: Fat layer exposed Post Procedure Diagnosis Same as Pre-procedure Electronic Signature(s) Signed: 01/29/2023 5:22:52 PM By: Allen Derry PA-C Signed: 01/30/2023 4:51:53 PM By: Shawn Stall RN, BSN Entered By: Shawn Stall on 01/29/2023 09:29:07 Vallie, Aniyha ([id number]) [id number] Page 2 of 13 -------------------------------------------------------------------------------- Debridement Details Patient Name: Date of Service: [name], [name] 01/29/2023 8:45 A M Medical Record Number: [id number] Patient Account Number: [id number] Date of Birth/Sex: Treating RN: Oct 01, 1966 (57 y.o. [name], Millard.Loa Primary Care Provider: [name] Other Clinician: Referring Provider: Treating Provider/Extender: [name] in Treatment: 33 Debridement Performed for Assessment: Wound #4R Right,Distal,Plantar Foot Performed By: Physician Lenda Kelp, PA Debridement Type: Debridement Severity of Tissue Pre Debridement: Fat layer exposed Level of Consciousness (Pre-procedure): Awake and Alert Pre-procedure Verification/Time Out Yes - 09:20 Taken: Start Time: 09:21 Pain Control: Lidocaine 5% topical ointment Percent of Wound Bed Debrided: 100% T Area Debrided (cm): otal 0.78 Tissue and other material debrided: Viable, Non-Viable, Callus, Slough, Subcutaneous, Skin: Dermis , Skin: Epidermis, Biofilm, Slough Level: Skin/Subcutaneous Tissue Debridement Description: Excisional Instrument: Curette Bleeding: None End Time: 09:27 Procedural Pain: 3 Post Procedural Pain: 3 Response to Treatment: Procedure was tolerated well Level of Consciousness (Post- Awake and Alert procedure): Post  Debridement Measurements of Total Wound Length: (cm) 1 Width: (cm) 1 Depth: (cm) 0.2 Volume: (cm) 0.157 Character of Wound/Ulcer Post Debridement: Improved Severity of Tissue Post Debridement: Fat layer exposed Post Procedure Diagnosis Same as Pre-procedure Electronic Signature(s) Signed: 01/29/2023 5:22:52 PM By: Allen Derry PA-C Signed: 01/30/2023 4:51:53 PM By: Shawn Stall RN, BSN Entered By: Shawn Stall on 01/29/2023 09:31:59 -------------------------------------------------------------------------------- HPI Details Patient Name: Date of Service: [name], [name] 01/29/2023 8:45 A M Medical Record Number: [id number] Patient Account Number: [id number] Date of Birth/Sex: Treating RN: [birth year] (57 y.o. F) Primary Care Provider: [name] Other Clinician: Referring Provider:  Treating Provider/Extender: [name] Weeks in Treatment: 33 History of Present Illness HPI Description: 06-13-2022 upon evaluation today patient presents for evaluation of her right heel ulcer. She is having a tremendous amount of pain at this location. She tells me that her most recent hemoglobin A1c was 8.7 and that was on 08-23-2021. This ulcer on the heel has been present she states since February 2023 when she had an injection to the heel by podiatry. She states that she began to have increasing pain the wound opened and has been open ever since. 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 She had an x-ray in June if anything can although there did not appear to be any obvious evidence of osteomyelitis at that point. She subsequently has had OR debridement several times of the wound performed by podiatry. Patient does have a history of lymphedema of the lower extremities she is also a type II diabetic. 06-19-2022 upon evaluation today patient appears to be doing better in regard to the size of her leg which is significantly improved. With that being said she  had a lot of drainage from the heel which is completely understandable considering what is going on at this time. There does not appear to be any evidence of [name], [name] ([id number]) [phone].pdf Page 3 of 13 active infection there is no warmth or irritation to the leg in general. With that being said the patient does have an issue here with the amount of drainage that is going on I definitely think Zetuvit is good to be the better way to go in place of ABD pads. Also think that she could potentially benefit from 3 times per week dressing changes but again right now we are going to stick with the to change it today, change in her Friday, and then subsequently seeing where things stand next Wednesday. The other option would be to change her to a different day for wound care having her come then say like a Tuesday Friday or Monday Thursday. 06-26-2022 upon evaluation patient's wound bed actually showed signs of doing well in regard to the overall size it was measuring slightly smaller. With that being said unfortunately the biggest issue we see is she still has a tremendous amount of drainage which is what could keep this from being able to use a total contact cast. For that reason I am did going to discuss with her today the possibility of trying Tubigrip to see if that could be of benefit for her. She voiced understanding and is in agreement with giving this a try. 07-03-2022 upon evaluation today patient unfortunately appears to be doing significantly worse at this point in regard to her swelling due to the fact that she was unable to really wear the Tubigrip. She tells me that it was cutting into her. With that being said she also did have an MRI this MRI revealed that she does have osteomyelitis in the heel this was actually just performed Monday, 25 September. This does show signs of "early osteomyelitis". Nonetheless this is still concerning to me. The wound also appears  to be getting deeper in the center aspect of this which has me a little concerned as well. I do believe that she is likely going require an aggressive approach here to try to get things better. I discussed that in greater detail with her today. I will detailed in the plan. 07-17-2022 upon evaluation today patient appears to be doing poorly in regard  to her wound. Unfortunately she does not show any signs of infection systemically though locally this seems to be doing worse with the wound actually being bigger than where it was previous. She did have an MRI that showed evidence of osteomyelitis actually recommended a referral to ID she was evaluated infectious disease and they recommended referral to podiatry and to be honest have recommended amputation based on what the patient tells me today. With that being said this is something that she is not interested in at all to be perfectly honest. For that reason she wants to know what she should do and where she should go at this point that is where the majority of the conversation which was quite lengthy today went. Obviously understand her concern here but I think she is going to have to definitely get off this and likely this means she is going to need to come out of work. 10/18; Since the patient was the patient is using Hydrofera Blue on her right heel wound. She has a new larger open area superior to the wound. The skin on the bottom of her foot is completely macerated. I reviewed the infectious disease note on this patient from 10/9. They recommended keeping the patient on Delofloxacin 450 twice daily until follow-up tomorrow. The patient states she is not going back there as the only thing they wanted to do was "cut my leg off]. She has not seen podiatry. Her lab works shows a CRP markedly elevated at 240.2 a sedimentation rate of 96 the rest of her CBC is normal creatinine of 0.38 The patient has been to see her primary doctor who is Dr. Leonides Schanz  [SPo] In [place]. According the patient Dr. Loralee Pacas has ordered vancomycin and Zosyn to start on November 1. She is not taking the delofloxacin that was suggested by infectious disease. Very angry that they just wanted to consider her for an amputation although I do not specifically see that stated in their note 07-31-2022 upon evaluation today patient still has a significant wound over her plantar aspect of her right l foot region. With that being said I am definitely concerned about the fact that the patient probably does need to be in hyperbaric oxygen therapy at this point. I think we should try to get this going as quickly as possible. She actually is going to be set up with a PICC line next Wednesday and then following this we will have the vancomycin and Zosyn that she will be taking for 8 weeks according to what she tells me. I think this is definitely going to be beneficial and again the goal here is limb salvage. In combination with that I think the hyperbaric oxygen therapy would be ultimately significantly helpful for her. 08-07-2022 upon evaluation today patient appears to still be doing quite poorly overall she still has not gotten her antibiotics. She got her PICC line today which I assumed she will be getting the first dose of antibiotics as well during that time. With that being said unfortunately she did not get the antibiotics and in fact as I questioned her further she does not even know when or if that is going to be started this week. Again I am very concerned about this considering she already has a PICC line in place we do not want this to clot off on top of the fact that she should already be on these antibiotics she just never went to the ER for evaluation therefore they never got this  started. At this point based on what I am seeing I really think that the ideal thing would be for [place] to see about getting her to the ER for further evaluation and treatment to see if they can get this  started for her as soon as possible. The patient voiced understanding and is in agreement with the plan. I gave her recommendations for what should be done also called [facility] infusion therapy in order to see if they would be the ones that seem to be available for her IV infusions but they tell me that they no longer do this that is apparently where the orders were sent to for her infusion therapy. That was by her primary care provider. 08-21-2022 upon evaluation today patient's wound on the bottom of the heel and foot area as well as the toe appear to be doing significantly better. Fortunately I do not see any evidence of infection locally or systemically and everything is measuring much smaller than where we have been. The wound in the left gluteal region also is dramatically improved compared to last time she was here. She has started the antibiotics this is cefepime and vancomycin. She did have a somewhat high trough and therefore they have her hold that today and they are to recheck in the morning I believe she told me. Nonetheless with the antibiotics going she seems to be doing significantly better which is great news. 08-28-2022 upon evaluation today patient appears to be doing somewhat better in regard to her foot there is definitely less drainage than what we have seen in the past. I do believe the antibiotics are helping. With that being said she is also been approved for hyperbaric oxygen therapy and I think as soon as we get this started the better. I discussed that with her today as well. With that being said I do think that the last thing she needs is her chest x-ray when she has that done will be ready to start her into treatment. She is going to try to go get that today when she leaves which I think would be ideal. 09-11-2022 upon evaluation today patient appears to be doing well with regard to her foot ulcer. This is showing signs of no digression and she does not appear to be having as  much drainage as before though it still very wet is not nearly the extreme of what it was previous. Fortunately there does not appear to be any signs of active infection locally nor systemically at this point which is great news. Patient did have her chest x-ray as well as EKG and she is actually approved and completely cleared for HBO therapy from both a clinical standpoint as well as an insurance standpoint. 09-25-2022 upon evaluation today patient appears to be doing well in regard to her foot ulcer. Unfortunately she is not doing as well when it comes to her hyperbaric treatments. She is having a lot of trouble with claustrophobia. She tells me in general that she is not sure she is good to be able to continue. 10-09-2022 upon evaluation today patient's wounds actually are showing signs of excellent improvement which is great news. Fortunately I do not see any evidence of infection locally or systemically which is great news and overall I am extremely pleased with where things are currently. I do think that she is making good progress she does tell me at this point today she does not want to go forward with a hyperbaric oxygen therapy. 10-16-2022 upon  evaluation today patient appears to be doing excellent in regard to her foot ulcer. This is actually showing signs of excellent improvement. She still does not allow for any sharp debridement but the good news is this is doing much better and does not even need it at the moment. Nonetheless as far as sharp debridement is concerned this has been too painful for really not been able to do that all along. 10-23-2022 upon evaluation today patient actually is making excellent progress. I am extremely pleased with where we stand I have considered even seeing about putting her in the total contact cast but to be honest she is doing so well currently and still with having some drainage I feel like it is better for her to be able to change it then to be locked up  in the cast for a week at a time. She has voiced understanding as well. Overall though I feel like you are making some really good progress here. 10-30-2022 upon evaluation today patient appears to be doing excellent in regard to her foot ulcers. Both are showing signs of improvement which is great news and overall I feel like that they are measuring smaller, looking better, and I see no signs of resurgence of the infection which is great news. Overall I am extremely pleased at this point. 2/7; the patient has 2 wounds which are remanence of the large wound on the right foot. This was a diabetic wound with underlying osteomyelitis she has completed antibiotics. Today she has the open area on the distal calcaneus and an area on the right midfoot which is eschared. She has been using silver Cullom, Avila ([id number]) [phone].pdf Page 4 of 13 alginate and offloading this in a regular running shoe. 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 11-27-2022 upon evaluation today patient appears to be doing well currently in regard to her wound which is actually showing signs of significant improvement this is great news. Fortunately there does not appear to be any signs of active infection locally nor systemically which is also excellent. No fevers, chills, nausea, vomiting, or diarrhea. 12-25-2022 upon evaluation today patient appears to be doing well currently in regard to the distal portion of her foot which is actually in my opinion looking a little better but have a lot of callus she is going require some debridement here. With that being said the heel actually looks like it might be a little bit worse. There is a lot of callus I cannot tell exactly what is open or not we can remove that and we will see what exactly were dealing with there. With that being said she also has an area on the right side of her leg  which appears to be more of a pressure injury she tells me this is from sitting in her recliner she has gotten a pillow to help take care of this. Fortunately it is not technically open and draining at this point made it very well may be before it is also not done. 01-01-2023 upon evaluation today patient appears to be doing well currently in regard to her wounds I feel like the cefdinir has helped she seems to be improved compared to last week's evaluation. I do not see any signs of active infection locally nor systemically at this time which is great news. No fevers, chills, nausea, vomiting, or diarrhea. 01-08-2023 upon evaluation today patient's wounds on the foot appear  to be doing decently well the leg is still about the same. With that being said unfortunately she does have a lot of erythema on the leg in particular which is unfortunate and definitely not what we are looking for. Again I do not think that the [facility] has really done well for her in that regard. 01-15-2023 upon evaluation today patient appears to be doing well currently in regard to her wound. Overall I think that things are measuring a little bit better but unfortunately the erythema is spreading. I did review her culture today. Unfortunately it does show that she does have MRSA noted and again I think that we need to do something to try to get this under control as quickly as possible. She has previously been on IV antibiotics and unfortunately that has not been sufficient to get this completely cleared it seems to have come back at this point and she really wants to avoid going back on IV antibiotics. We have been using oral antibiotics and various forms the most recent was actually a course of cefdinir. She is also previously been on clindamycin and even in the past she has had to doxycycline. With that being said with all things considered I think we are still having a lot of trouble here keeping this infection under control. We  previously attempted hyperbarics but the patient did not tolerate this well. She therefore discontinued HBO therapy. 01-22-2023 upon evaluation today patient appears to be doing a little better in regard to the cellulitis on her right leg. I am much more pleased with what I am seeing today. With that being said unfortunately she is still having quite a bit of issues here with erythema though not as bad I think that were not completely clear. Unfortunately we were denied for the [facility] prescription. She has the medication but I provided for her by way of samples but nothing further. 01-29-2023 upon evaluation today patient appears to be doing poorly currently in regard to her leg. I do not feel like she is showing as much improvement as she was within desirable now that we switch her to linezolid. With that being said I do believe that we probably need to see about IV antibiotics I recommended a referral back to ID. With that being said she would prefer to see her physician which I am okay with but either way I think she needs to be back on IV antibiotics for a minimum of 2 months as soon as possible. I am going to send her for lab work today in order to get some baseline labs but honestly I think that we are going to require IV antibiotics to get this under control. Electronic Signature(s) Signed: 01/29/2023 5:04:25 PM By: Allen Derry PA-C Entered By: Allen Derry on 01/29/2023 17:04:24 -------------------------------------------------------------------------------- Physical Exam Details Patient Name: Date of Service: CREOLA, KROTZ 01/29/2023 8:45 A M Medical Record Number: [id number] Patient Account Number: [id number] Date of Birth/Sex: Treating RN: [birth year] (57 y.o. F) Primary Care Provider: [name] Other Clinician: Referring Provider: Treating Provider/Extender: [name] in Treatment: 89 Constitutional Chronically ill appearing but in no apparent acute  distress. Respiratory normal breathing without difficulty. Psychiatric this patient is able to make decisions and demonstrates good insight into disease process. Alert and Oriented x 3. pleasant and cooperative. Notes Upon inspection patient's wound bed actually showed signs of good granulation epithelization in some areas there was callus and others we have really been stalled for the past few  weeks after making some pretty good progress for quite a few weeks. Nonetheless I think at this point that she really needs something done in order to expedite things and I really think the antibiotics and bacterial control is good to be of utmost concern here. Electronic Signature(s) Signed: 01/29/2023 5:04:53 PM By: Allen Derry PA-C Entered By: Allen Derry on 01/29/2023 17:04:53 -------------------------------------------------------------------------------- Physician Orders Details Patient Name: Date of Service: CHESTINE, BELKNAP 01/29/2023 8:45 A M Medical Record Number: [id number] Patient Account Number: [id number] LACOLE, KOMOROWSKI ([id number]) [id number] Page 5 of 13 Date of Birth/Sex: Treating RN: [birth year] (57 y.o. [name], Millard.Loa Primary Care Provider: Other Clinician: [name] Referring Provider: Treating Provider/Extender: [name] in Treatment: 54 Verbal / Phone Orders: No Diagnosis Coding ICD-10 Coding Code Description [phone] Other chronic osteomyelitis, right ankle and foot E11.621 Type 2 diabetes mellitus with foot ulcer L97.412 Non-pressure chronic ulcer of right heel and midfoot with fat layer exposed I89.0 Lymphedema, not elsewhere classified Follow-up Appointments ppointment in 1 week. Allen Derry, [place] Wednesday 02/05/23 @ 0930 Return A ppointment in 2 weeks. Allen Derry, [place] Wednesday 02/12/23 @ 0930 Return A Other: - Pick up linezolid from pharmacy. patient to see primary care provider can order and start further IV  antibiotics minimal two months for osteomyelitis right foot. Blood work please have [facility] fax to wound center [phone]. Anesthetic (In clinic) Topical Lidocaine 5% applied to wound bed Bathing/ Shower/ Hygiene May shower with protection but do not get wound dressing(s) wet. Protect dressing(s) with water repellant cover (for example, large plastic bag) or a cast cover and may then take shower. Edema Control - Lymphedema / SCD / Other Elevate legs to the level of the heart or above for 30 minutes daily and/or when sitting for 3-4 times a day throughout the day. Avoid standing for long periods of time. Off-Loading Other: - Minimize walking and standing as much as possible to right foot to aid in offloading to wound. While at work use the wheelchair for mobility. felt in shoe to aid in offloading wound and heel. Wound Treatment Wound #1 - Calcaneus Wound Laterality: Right Cleanser: Soap and Water 1 x Per Day/30 Days Discharge Instructions: May shower and wash wound with dial antibacterial soap and water prior to dressing change. Cleanser: Wound Cleanser (Generic) 1 x Per Day/30 Days Discharge Instructions: Cleanse the wound with wound cleanser prior to applying a clean dressing using gauze sponges, not tissue or cotton balls. Peri-Wound Care: Sween Lotion (Moisturizing lotion) 1 x Per Day/30 Days Discharge Instructions: Apply moisturizing lotion as directed Prim Dressing: Sorbalgon AG Dressing 2x2 (in/in) (Generic) 1 x Per Day/30 Days ary Discharge Instructions: Apply to wound bed as instructed Secondary Dressing: ABD Pad, 5x9 (Generic) 1 x Per Day/30 Days Discharge Instructions: Apply over primary dressing as directed. Secondary Dressing: Woven Gauze Sponge, Non-Sterile 4x4 in (Generic) 1 x Per Day/30 Days Discharge Instructions: Apply over primary dressing as directed. Secondary Dressing: heel ortho felt 1 x Per Day/30 Days Discharge Instructions: apply in shoe to aid in offloading of  heel and wound. Secured With: Web designer, Sterile 4x75 (in/in) (Generic) 1 x Per Day/30 Days Discharge Instructions: Secure with stretch gauze as directed. Secured With: 49M Medipore H Soft Cloth Surgical T ape, 4 x 10 (in/yd) (Generic) 1 x Per Day/30 Days Discharge Instructions: Secure with tape as directed. Wound #4R - Foot Wound Laterality: Plantar, Right, Distal Cleanser: Soap and Water 1 x Per Day/30 Days  Discharge Instructions: May shower and wash wound with dial antibacterial soap and water prior to dressing change. Cleanser: Wound Cleanser (Generic) 1 x Per Day/30 Days Discharge Instructions: Cleanse the wound with wound cleanser prior to applying a clean dressing using gauze sponges, not tissue or cotton balls. Peri-Wound Care: Sween Lotion (Moisturizing lotion) 1 x Per Day/30 Days Discharge Instructions: Apply moisturizing lotion as directed Helbing, Myelle ([id number]) [id number] Page 6 of 13 Prim Dressing: Sorbalgon AG Dressing 2x2 (in/in) (Generic) 1 x Per Day/30 Days ary Discharge Instructions: Apply to wound bed as instructed Secondary Dressing: ABD Pad, 5x9 (Generic) 1 x Per Day/30 Days Discharge Instructions: Apply over primary dressing as directed. Secondary Dressing: Woven Gauze Sponge, Non-Sterile 4x4 in 1 x Per Day/30 Days Discharge Instructions: Apply over primary dressing as directed. Secondary Dressing: heel ortho felt 1 x Per Day/30 Days Discharge Instructions: apply in shoe to aid in offloading of heel and wound. Secured With: Web designer, Sterile 4x75 (in/in) (Generic) 1 x Per Day/30 Days Discharge Instructions: Secure with stretch gauze as directed. Secured With: 43M Medipore H Soft Cloth Surgical T ape, 4 x 10 (in/yd) 1 x Per Day/30 Days Discharge Instructions: Secure with tape as directed. Wound #5 - Lower Leg Wound Laterality: Right, Lateral Cleanser: Soap and Water 1 x Per Day/30  Days Discharge Instructions: May shower and wash wound with dial antibacterial soap and water prior to dressing change. Cleanser: Wound Cleanser (Generic) 1 x Per Day/30 Days Discharge Instructions: Cleanse the wound with wound cleanser prior to applying a clean dressing using gauze sponges, not tissue or cotton balls. Peri-Wound Care: Sween Lotion (Moisturizing lotion) 1 x Per Day/30 Days Discharge Instructions: Apply moisturizing lotion as directed Prim Dressing: Sorbalgon AG Dressing 2x2 (in/in) (Generic) 1 x Per Day/30 Days ary Discharge Instructions: Apply to wound bed as instructed Secondary Dressing: Zetuvit Plus Silicone Border Dressing 4x4 (in/in) (Generic) 1 x Per Day/30 Days Discharge Instructions: Apply silicone border over primary dressing as directed. Laboratory CBC W A Differential panel in Blood (HEM-CBC) - (ICD10 M86.671 - Other chronic osteomyelitis, right ankle and foot) uto LOINC Code: [phone] Convenience Name: CBC W Auto Differential panel Erythrocyte sedimentation rate (HEM) - (ICD10 M86.671 - Other chronic osteomyelitis, right ankle and foot) LOINC Code: 40102-7 Convenience Name: Sed rate-method unspecified C reactive protein [Mass/volume] in Serum or Plasma (CHEM) - (ICD10 M86.671 - Other chronic osteomyelitis, right ankle and foot) LOINC Code: 1988-5 Convenience Name: C Reactive Protein in serum or plasma Electronic Signature(s) Signed: 01/29/2023 5:22:52 PM By: Allen Derry PA-C Signed: 01/30/2023 4:51:53 PM By: Shawn Stall RN, BSN Entered By: Shawn Stall on 01/29/2023 09:35:13 Prescription 01/29/2023 -------------------------------------------------------------------------------- Erick Blinks PA Patient Name: Provider: [birth year] [id number] Date of Birth: NPI#Sherral Hammers Sex: DEA #: [phone] Phone #: License #: UPN: Patient Address: [address] Eligha Bridegroom [facility] [place], [place] [address] [address] [place], [place] [address] [phone] Vaca, [name] ([id number]) [id number] Page 7 of 13 Allergies No Known Allergies Provider's Orders CBC W A Differential panel in Blood - ICD10: M86.671 uto LOINC Code: 57021-8 Convenience Name: CBC W Auto Differential panel Hand Signature: Date(s): Prescription 01/29/2023 Skelton, Clayton Bibles PA Patient Name: Provider: 1966/05/23 [id number] Date of Birth: NPI#: [id number] Sex: DEA #: [phone] Phone #: License #: UPN: Patient Address: [address] Eligha Bridegroom [facility] [place], [place] [address] [address] [place], [place] [address] [phone] Allergies No Known Allergies Provider's Orders Erythrocyte sedimentation rate - ICD10: M86.671 LOINC Code: 91478-2 Convenience Name: Sed rate-method unspecified Hand Signature: Date(s): Prescription 01/29/2023 Kaczynski, Clayton Bibles PA Patient Name: Provider: 17-Jun-1966 [id number] Date of Birth: NPI#: [id number] Sex: DEA #: [phone] Phone #: License #: UPN: Patient Address: [address] Eligha Bridegroom [facility] [place], [place] [address] [address] [place], [place] [address] [phone] Allergies No Known Allergies Provider's Orders C reactive protein [Mass/volume] in Serum or Plasma - ICD10: M86.671 LOINC Code: 1988-5 Convenience Name: C Reactive Protein in serum or plasma Hand Signature: Date(s): Electronic Signature(s) Signed: 01/29/2023 5:22:52 PM By: Allen Derry PA-C Signed: 01/30/2023 4:51:53 PM By: Shawn Stall RN, BSN Entered By: Shawn Stall on 01/29/2023 09:35:14 Snodgrass, Monigue ([id number]) [id number] Page 8 of 13 -------------------------------------------------------------------------------- Problem List Details Patient Name: Date of Service: CREEDENCE, HEISS 01/29/2023  8:45 A M Medical Record Number: [id number] Patient Account Number: [id number] Date of Birth/Sex: Treating RN: Sep 07, 1966 (57 y.o. [name], Yvonne Kendall Primary Care Provider: [name] Other Clinician: Referring Provider: Treating Provider/Extender: [name] in Treatment: 17 Active Problems ICD-10 Encounter Code Description Active Date MDM Diagnosis [phone] Other chronic osteomyelitis, right ankle and foot 08/28/2022 No Yes E11.621 Type 2 diabetes mellitus with foot ulcer 06/12/2022 No Yes L97.412 Non-pressure chronic ulcer of right heel and midfoot with fat layer exposed 06/12/2022 No Yes I89.0 Lymphedema, not elsewhere classified 06/12/2022 No Yes Inactive Problems Resolved Problems Electronic Signature(s) Signed: 01/29/2023 9:16:52 AM By: Allen Derry PA-C Entered By: Allen Derry on 01/29/2023 09:16:52 -------------------------------------------------------------------------------- Progress Note Details Patient Name: Date of Service: LOETA, HERST 01/29/2023 8:45 A M Medical Record Number: [id number] Patient Account Number: [id number] Date of Birth/Sex: Treating RN: Dec 20, 1965 (57 y.o. F) Primary Care Provider: [name] Other Clinician: Referring Provider: Treating Provider/Extender: [name] in Treatment: 39 Subjective Chief Complaint Information obtained from Patient Right heel ulcer History of Present Illness (HPI) 06-13-2022 upon evaluation today patient presents for evaluation of her right heel ulcer. She is having a tremendous amount of pain at this location. She tells me that her most recent hemoglobin A1c was 8.7 and that was on 08-23-2021. This ulcer on the heel has been present she states since February 2023 when she had an injection to the heel by podiatry. She states that she began to have increasing pain the wound opened and has been open ever since. 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 She had an x-ray in June if anything can although there did not appear to be any obvious evidence of osteomyelitis at that point. She subsequently has had OR debridement several times of the wound performed by podiatry. Patient does have a history of lymphedema of the lower extremities she is also a type II diabetic. 06-19-2022 upon evaluation today patient appears to be doing better in regard to the size of her leg which is significantly improved. With that being said she had a lot of drainage from the heel which is completely understandable considering what is going on at this time. There does not appear to be any evidence of active infection there is no warmth or irritation to the leg in general. With that being said the patient does have an issue here with the amount of drainage that is going on I definitely think Zetuvit is good to be the better  way to go in place of ABD pads. Also think that she could potentially benefit from 3 times per week dressing changes but again right now we are going to stick with the to change it today, change in her Friday, and then subsequently seeing where things stand next Wednesday. The other option would be to change her to a different day for wound care having her come then say like a Tuesday Friday or Monday Thursday. 06-26-2022 upon evaluation patient's wound bed actually showed signs of doing well in regard to the overall size it was measuring slightly smaller. With that being said unfortunately the biggest issue we see is she still has a tremendous amount of drainage which is what could keep this from being able to use a total Roehm, Coreena ([id number]) [id number] Page 9 of 13 contact cast. For that reason I am did going to discuss with her today the possibility of trying Tubigrip to see if that could be of benefit for her. She voiced understanding and is in agreement with giving this a try. 07-03-2022 upon evaluation today  patient unfortunately appears to be doing significantly worse at this point in regard to her swelling due to the fact that she was unable to really wear the Tubigrip. She tells me that it was cutting into her. With that being said she also did have an MRI this MRI revealed that she does have osteomyelitis in the heel this was actually just performed Monday, 25 September. This does show signs of "early osteomyelitis". Nonetheless this is still concerning to me. The wound also appears to be getting deeper in the center aspect of this which has me a little concerned as well. I do believe that she is likely going require an aggressive approach here to try to get things better. I discussed that in greater detail with her today. I will detailed in the plan. 07-17-2022 upon evaluation today patient appears to be doing poorly in regard to her wound. Unfortunately she does not show any signs of infection systemically though locally this seems to be doing worse with the wound actually being bigger than where it was previous. She did have an MRI that showed evidence of osteomyelitis actually recommended a referral to ID she was evaluated infectious disease and they recommended referral to podiatry and to be honest have recommended amputation based on what the patient tells me today. With that being said this is something that she is not interested in at all to be perfectly honest. For that reason she wants to know what she should do and where she should go at this point that is where the majority of the conversation which was quite lengthy today went. Obviously understand her concern here but I think she is going to have to definitely get off this and likely this means she is going to need to come out of work. 10/18; Since the patient was the patient is using Hydrofera Blue on her right heel wound. She has a new larger open area superior to the wound. The skin on the bottom of her foot is completely macerated. I  reviewed the infectious disease note on this patient from 10/9. They recommended keeping the patient on Delofloxacin 450 twice daily until follow-up tomorrow. The patient states she is not going back there as the only thing they wanted to do was "cut my leg off]. She has not seen podiatry. Her lab works shows a CRP markedly elevated at 240.2 a sedimentation rate of 96 the rest  of her CBC is normal creatinine of 0.38 The patient has been to see her primary doctor who is Dr. Leonides Schanz [SP[facility]] In [place]. According the patient Dr. Loralee Pacas has ordered vancomycin and Zosyn to start on November 1. She is not taking the delofloxacin that was suggested by infectious disease. Very angry that they just wanted to consider her for an amputation although I do not specifically see that stated in their note 07-31-2022 upon evaluation today patient still has a significant wound over her plantar aspect of her right l foot region. With that being said I am definitely concerned about the fact that the patient probably does need to be in hyperbaric oxygen therapy at this point. I think we should try to get this going as quickly as possible. She actually is going to be set up with a PICC line next Wednesday and then following this we will have the vancomycin and Zosyn that she will be taking for 8 weeks according to what she tells me. I think this is definitely going to be beneficial and again the goal here is limb salvage. In combination with that I think the hyperbaric oxygen therapy would be ultimately significantly helpful for her. 08-07-2022 upon evaluation today patient appears to still be doing quite poorly overall she still has not gotten her antibiotics. She got her PICC line today which I assumed she will be getting the first dose of antibiotics as well during that time. With that being said unfortunately she did not get the antibiotics and in fact as I questioned her further she does not even know when or if that is going  to be started this week. Again I am very concerned about this considering she already has a PICC line in place we do not want this to clot off on top of the fact that she should already be on these antibiotics she just never went to the ER for evaluation therefore they never got this started. At this point based on what I am seeing I really think that the ideal thing would be for [place] to see about getting her to the ER for further evaluation and treatment to see if they can get this started for her as soon as possible. The patient voiced understanding and is in agreement with the plan. I gave her recommendations for what should be done also called [facility] infusion therapy in order to see if they would be the ones that seem to be available for her IV infusions but they tell me that they no longer do this that is apparently where the orders were sent to for her infusion therapy. That was by her primary care provider. 08-21-2022 upon evaluation today patient's wound on the bottom of the heel and foot area as well as the toe appear to be doing significantly better. Fortunately I do not see any evidence of infection locally or systemically and everything is measuring much smaller than where we have been. The wound in the left gluteal region also is dramatically improved compared to last time she was here. She has started the antibiotics this is cefepime and vancomycin. She did have a somewhat high trough and therefore they have her hold that today and they are to recheck in the morning I believe she told me. Nonetheless with the antibiotics going she seems to be doing significantly better which is great news. 08-28-2022 upon evaluation today patient appears to be doing somewhat better in regard to her foot there is definitely less drainage  than what we have seen in the past. I do believe the antibiotics are helping. With that being said she is also been approved for hyperbaric oxygen therapy and I think as soon  as we get this started the better. I discussed that with her today as well. With that being said I do think that the last thing she needs is her chest x-ray when she has that done will be ready to start her into treatment. She is going to try to go get that today when she leaves which I think would be ideal. 09-11-2022 upon evaluation today patient appears to be doing well with regard to her foot ulcer. This is showing signs of no digression and she does not appear to be having as much drainage as before though it still very wet is not nearly the extreme of what it was previous. Fortunately there does not appear to be any signs of active infection locally nor systemically at this point which is great news. Patient did have her chest x-ray as well as EKG and she is actually approved and completely cleared for HBO therapy from both a clinical standpoint as well as an insurance standpoint. 09-25-2022 upon evaluation today patient appears to be doing well in regard to her foot ulcer. Unfortunately she is not doing as well when it comes to her hyperbaric treatments. She is having a lot of trouble with claustrophobia. She tells me in general that she is not sure she is good to be able to continue. 10-09-2022 upon evaluation today patient's wounds actually are showing signs of excellent improvement which is great news. Fortunately I do not see any evidence of infection locally or systemically which is great news and overall I am extremely pleased with where things are currently. I do think that she is making good progress she does tell me at this point today she does not want to go forward with a hyperbaric oxygen therapy. 10-16-2022 upon evaluation today patient appears to be doing excellent in regard to her foot ulcer. This is actually showing signs of excellent improvement. She still does not allow for any sharp debridement but the good news is this is doing much better and does not even need it at the moment.  Nonetheless as far as sharp debridement is concerned this has been too painful for really not been able to do that all along. 10-23-2022 upon evaluation today patient actually is making excellent progress. I am extremely pleased with where we stand I have considered even seeing about putting her in the total contact cast but to be honest she is doing so well currently and still with having some drainage I feel like it is better for her to be able to change it then to be locked up in the cast for a week at a time. She has voiced understanding as well. Overall though I feel like you are making some really good progress here. 10-30-2022 upon evaluation today patient appears to be doing excellent in regard to her foot ulcers. Both are showing signs of improvement which is great news and overall I feel like that they are measuring smaller, looking better, and I see no signs of resurgence of the infection which is great news. Overall I am extremely pleased at this point. 2/7; the patient has 2 wounds which are remanence of the large wound on the right foot. This was a diabetic wound with underlying osteomyelitis she has completed antibiotics. Today she has the open area on the  distal calcaneus and an area on the right midfoot which is eschared. She has been using silver alginate and offloading this in a regular running shoe. 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 11-27-2022 upon evaluation today patient appears to be doing well currently in regard to her wound which is actually showing signs of significant improvement this is great news. Fortunately there does not appear to be any signs of active infection locally nor systemically which is also excellent. No fevers, chills, nausea, vomiting, or diarrhea. 12-25-2022 upon evaluation today patient appears to be doing well currently in regard to the distal portion of her foot which is  actually in my opinion looking a Tess, Judie ([id number]) [phone].pdf Page 10 of 13 little better but have a lot of callus she is going require some debridement here. With that being said the heel actually looks like it might be a little bit worse. There is a lot of callus I cannot tell exactly what is open or not we can remove that and we will see what exactly were dealing with there. With that being said she also has an area on the right side of her leg which appears to be more of a pressure injury she tells me this is from sitting in her recliner she has gotten a pillow to help take care of this. Fortunately it is not technically open and draining at this point made it very well may be before it is also not done. 01-01-2023 upon evaluation today patient appears to be doing well currently in regard to her wounds I feel like the cefdinir has helped she seems to be improved compared to last week's evaluation. I do not see any signs of active infection locally nor systemically at this time which is great news. No fevers, chills, nausea, vomiting, or diarrhea. 01-08-2023 upon evaluation today patient's wounds on the foot appear to be doing decently well the leg is still about the same. With that being said unfortunately she does have a lot of erythema on the leg in particular which is unfortunate and definitely not what we are looking for. Again I do not think that the [facility] has really done well for her in that regard. 01-15-2023 upon evaluation today patient appears to be doing well currently in regard to her wound. Overall I think that things are measuring a little bit better but unfortunately the erythema is spreading. I did review her culture today. Unfortunately it does show that she does have MRSA noted and again I think that we need to do something to try to get this under control as quickly as possible. She has previously been on IV antibiotics and unfortunately that has  not been sufficient to get this completely cleared it seems to have come back at this point and she really wants to avoid going back on IV antibiotics. We have been using oral antibiotics and various forms the most recent was actually a course of cefdinir. She is also previously been on clindamycin and even in the past she has had to doxycycline. With that being said with all things considered I think we are still having a lot of trouble here keeping this infection under control. We previously attempted hyperbarics but the patient did not tolerate this well. She therefore discontinued HBO therapy. 01-22-2023 upon evaluation today patient appears to be doing a little better in regard to the cellulitis on her right leg. I  am much more pleased with what I am seeing today. With that being said unfortunately she is still having quite a bit of issues here with erythema though not as bad I think that were not completely clear. Unfortunately we were denied for the [facility] prescription. She has the medication but I provided for her by way of samples but nothing further. 01-29-2023 upon evaluation today patient appears to be doing poorly currently in regard to her leg. I do not feel like she is showing as much improvement as she was within desirable now that we switch her to linezolid. With that being said I do believe that we probably need to see about IV antibiotics I recommended a referral back to ID. With that being said she would prefer to see her physician which I am okay with but either way I think she needs to be back on IV antibiotics for a minimum of 2 months as soon as possible. I am going to send her for lab work today in order to get some baseline labs but honestly I think that we are going to require IV antibiotics to get this under control. Objective Constitutional Chronically ill appearing but in no apparent acute distress. Vitals Time Taken: 8:48 AM, Height: 65 in, Weight: 331 lbs, BMI: 55.1,  Temperature: 98.6 F, Pulse: 112 bpm, Respiratory Rate: 20 breaths/min, Blood Pressure: 124/90 mmHg. Respiratory normal breathing without difficulty. Psychiatric this patient is able to make decisions and demonstrates good insight into disease process. Alert and Oriented x 3. pleasant and cooperative. General Notes: Upon inspection patient's wound bed actually showed signs of good granulation epithelization in some areas there was callus and others we have really been stalled for the past few weeks after making some pretty good progress for quite a few weeks. Nonetheless I think at this point that she really needs something done in order to expedite things and I really think the antibiotics and bacterial control is good to be of utmost concern here. Integumentary (Hair, Skin) Wound #1 status is Open. Original cause of wound was Gradually Appeared. The date acquired was: 11/07/2020. The wound has been in treatment 33 weeks. The wound is located on the Right Calcaneus. The wound measures 0.5cm length x 1.3cm width x 0.4cm depth; 0.511cm^2 area and 0.204cm^3 volume. There is Fat Layer (Subcutaneous Tissue) exposed. There is no tunneling or undermining noted. There is a medium amount of serosanguineous drainage noted. The wound margin is thickened. There is large (67-100%) red, pink granulation within the wound bed. There is a small (1-33%) amount of necrotic tissue within the wound bed including Adherent Slough. The periwound skin appearance exhibited: Callus, Maceration. The periwound skin appearance did not exhibit: Crepitus, Excoriation, Induration, Rash, Scarring, Dry/Scaly, Atrophie Blanche, Cyanosis, Ecchymosis, Hemosiderin Staining, Mottled, Pallor, Rubor, Erythema. Periwound temperature was noted as No Abnormality. The periwound has tenderness on palpation. Wound #4R status is Open. Original cause of wound was Blister. The date acquired was: 11/08/2020. The wound has been in treatment 15 weeks.  The wound is located on the Right,Distal,Plantar Foot. The wound measures 0.2cm length x 0.2cm width x 0.2cm depth; 0.031cm^2 area and 0.006cm^3 volume. There is Fat Layer (Subcutaneous Tissue) exposed. There is no tunneling or undermining noted. There is a medium amount of serosanguineous drainage noted. The wound margin is distinct with the outline attached to the wound base. There is large (67-100%) red granulation within the wound bed. There is no necrotic tissue within the wound bed. The periwound skin appearance exhibited:  Callus, Dry/Scaly. The periwound skin appearance did not exhibit: Crepitus, Excoriation, Induration, Rash, Scarring, Maceration, Atrophie Blanche, Cyanosis, Ecchymosis, Hemosiderin Staining, Mottled, Pallor, Rubor, Erythema. Wound #5 status is Open. Original cause of wound was Gradually Appeared. The date acquired was: 07/07/2022. The wound has been in treatment 5 weeks. The wound is located on the Right,Lateral Lower Leg. The wound measures 0.5cm length x 0.6cm width x 0.1cm depth; 0.236cm^2 area and 0.024cm^3 volume. There is Fat Layer (Subcutaneous Tissue) exposed. There is no tunneling or undermining noted. There is a medium amount of serosanguineous drainage noted. The wound margin is distinct with the outline attached to the wound base. There is large (67-100%) red, hyper - granulation within the wound bed. There is a small (1-33%) amount of necrotic tissue within the wound bed including Adherent Slough. The periwound skin appearance exhibited: Hemosiderin Staining. The periwound skin appearance did not exhibit: Callus, Crepitus, Excoriation, Induration, Rash, Scarring, Dry/Scaly, Maceration, Atrophie Blanche, Cyanosis, Ecchymosis, Mottled, Pallor, Rubor, Erythema. Periwound temperature was noted as Hot. The periwound has tenderness on palpation. Assessment Active Problems Butchko, Omaria ([id number]) [id number] Page 11 of 13 ICD-10 Other  chronic osteomyelitis, right ankle and foot Type 2 diabetes mellitus with foot ulcer Non-pressure chronic ulcer of right heel and midfoot with fat layer exposed Lymphedema, not elsewhere classified Procedures Wound #1 Pre-procedure diagnosis of Wound #1 is a Diabetic Wound/Ulcer of the Lower Extremity located on the Right Calcaneus .Severity of Tissue Pre Debridement is: Fat layer exposed. There was a Excisional Skin/Subcutaneous Tissue Debridement with a total area of 4.71 sq cm performed by Lenda Kelp, PA. With the following instrument(s): Curette to remove Viable and Non-Viable tissue/material. Material removed includes Callus, Subcutaneous Tissue, Slough, Skin: Dermis, Skin: Epidermis, and Biofilm after achieving pain control using Lidocaine 5% topical ointment. A time out was conducted at 09:20, prior to the start of the procedure. There was no bleeding. The procedure was tolerated well with a pain level of 3 throughout and a pain level of 3 following the procedure. Post Debridement Measurements: 3cm length x 2cm width x 0.4cm depth; 1.885cm^3 volume. Character of Wound/Ulcer Post Debridement is improved. Severity of Tissue Post Debridement is: Fat layer exposed. Post procedure Diagnosis Wound #1: Same as Pre-Procedure Wound #4R Pre-procedure diagnosis of Wound #4R is a Diabetic Wound/Ulcer of the Lower Extremity located on the Right,Distal,Plantar Foot .Severity of Tissue Pre Debridement is: Fat layer exposed. There was a Excisional Skin/Subcutaneous Tissue Debridement with a total area of 0.78 sq cm performed by Lenda Kelp, PA. With the following instrument(s): Curette to remove Viable and Non-Viable tissue/material. Material removed includes Callus, Subcutaneous Tissue, Slough, Skin: Dermis, Skin: Epidermis, and Biofilm after achieving pain control using Lidocaine 5% topical ointment. A time out was conducted at 09:20, prior to the start of the procedure. There was no bleeding.  The procedure was tolerated well with a pain level of 3 throughout and a pain level of 3 following the procedure. Post Debridement Measurements: 1cm length x 1cm width x 0.2cm depth; 0.157cm^3 volume. Character of Wound/Ulcer Post Debridement is improved. Severity of Tissue Post Debridement is: Fat layer exposed. Post procedure Diagnosis Wound #4R: Same as Pre-Procedure Plan Follow-up Appointments: Return Appointment in 1 week. Allen Derry, [place] Wednesday 02/05/23 @ 0930 Return Appointment in 2 weeks. Allen Derry, [place] Wednesday 02/12/23 @ 0930 Other: - Pick up linezolid from pharmacy. patient to see primary care provider can order and start further IV antibiotics minimal two months for osteomyelitis right foot.  Blood work please have [facility] fax to wound center [phone]. Anesthetic: (In clinic) Topical Lidocaine 5% applied to wound bed Bathing/ Shower/ Hygiene: May shower with protection but do not get wound dressing(s) wet. Protect dressing(s) with water repellant cover (for example, large plastic bag) or a cast cover and may then take shower. Edema Control - Lymphedema / SCD / Other: Elevate legs to the level of the heart or above for 30 minutes daily and/or when sitting for 3-4 times a day throughout the day. Avoid standing for long periods of time. Off-Loading: Other: - Minimize walking and standing as much as possible to right foot to aid in offloading to wound. While at work use the wheelchair for mobility. felt in shoe to aid in offloading wound and heel. Laboratory ordered were: CBC W Auto Differential panel, Sed rate -method unspecified, C Reactive Protein in serum or plasma WOUND #1: - Calcaneus Wound Laterality: Right Cleanser: Soap and Water 1 x Per Day/30 Days Discharge Instructions: May shower and wash wound with dial antibacterial soap and water prior to dressing change. Cleanser: Wound Cleanser (Generic) 1 x Per Day/30 Days Discharge Instructions: Cleanse the wound with wound  cleanser prior to applying a clean dressing using gauze sponges, not tissue or cotton balls. Peri-Wound Care: Sween Lotion (Moisturizing lotion) 1 x Per Day/30 Days Discharge Instructions: Apply moisturizing lotion as directed Prim Dressing: Sorbalgon AG Dressing 2x2 (in/in) (Generic) 1 x Per Day/30 Days ary Discharge Instructions: Apply to wound bed as instructed Secondary Dressing: ABD Pad, 5x9 (Generic) 1 x Per Day/30 Days Discharge Instructions: Apply over primary dressing as directed. Secondary Dressing: Woven Gauze Sponge, Non-Sterile 4x4 in (Generic) 1 x Per Day/30 Days Discharge Instructions: Apply over primary dressing as directed. Secondary Dressing: heel ortho felt 1 x Per Day/30 Days Discharge Instructions: apply in shoe to aid in offloading of heel and wound. Secured With: Web designer, Sterile 4x75 (in/in) (Generic) 1 x Per Day/30 Days Discharge Instructions: Secure with stretch gauze as directed. Secured With: 314M Medipore H Soft Cloth Surgical T ape, 4 x 10 (in/yd) (Generic) 1 x Per Day/30 Days Discharge Instructions: Secure with tape as directed. WOUND #4R: - Foot Wound Laterality: Plantar, Right, Distal Cleanser: Soap and Water 1 x Per Day/30 Days Discharge Instructions: May shower and wash wound with dial antibacterial soap and water prior to dressing change. Cleanser: Wound Cleanser (Generic) 1 x Per Day/30 Days Discharge Instructions: Cleanse the wound with wound cleanser prior to applying a clean dressing using gauze sponges, not tissue or cotton balls. Peri-Wound Care: Sween Lotion (Moisturizing lotion) 1 x Per Day/30 Days Discharge Instructions: Apply moisturizing lotion as directed Prim Dressing: Sorbalgon AG Dressing 2x2 (in/in) (Generic) 1 x Per Day/30 Days ary Discharge Instructions: Apply to wound bed as instructed Secondary Dressing: ABD Pad, 5x9 (Generic) 1 x Per Day/30 Days Discharge Instructions: Apply over primary dressing as  directed. Secondary Dressing: Woven Gauze Sponge, Non-Sterile 4x4 in 1 x Per Day/30 Days Corkery, Tailynn ([id number]) [id number] Page 12 of 13 Discharge Instructions: Apply over primary dressing as directed. Secondary Dressing: heel ortho felt 1 x Per Day/30 Days Discharge Instructions: apply in shoe to aid in offloading of heel and wound. Secured With: Web designer, Sterile 4x75 (in/in) (Generic) 1 x Per Day/30 Days Discharge Instructions: Secure with stretch gauze as directed. Secured With: 314M Medipore H Soft Cloth Surgical T ape, 4 x 10 (in/yd) 1 x Per Day/30 Days Discharge Instructions: Secure with tape as directed. WOUND #  5: - Lower Leg Wound Laterality: Right, Lateral Cleanser: Soap and Water 1 x Per Day/30 Days Discharge Instructions: May shower and wash wound with dial antibacterial soap and water prior to dressing change. Cleanser: Wound Cleanser (Generic) 1 x Per Day/30 Days Discharge Instructions: Cleanse the wound with wound cleanser prior to applying a clean dressing using gauze sponges, not tissue or cotton balls. Peri-Wound Care: Sween Lotion (Moisturizing lotion) 1 x Per Day/30 Days Discharge Instructions: Apply moisturizing lotion as directed Prim Dressing: Sorbalgon AG Dressing 2x2 (in/in) (Generic) 1 x Per Day/30 Days ary Discharge Instructions: Apply to wound bed as instructed Secondary Dressing: Zetuvit Plus Silicone Border Dressing 4x4 (in/in) (Generic) 1 x Per Day/30 Days Discharge Instructions: Apply silicone border over primary dressing as directed. 1. Based on what I am seeing I do believe that the patient should continue to monitor for any signs of infection or worsening. Based on what I am seeing I do believe that we are moving in the right direction but I do believe also that this is a little stalled and she still has a lot of erythema around the foot as well as the leg we need to get this under control. She wants  to see her primary to see if they will put her back on the antibiotics I am okay with that as long as they are okay with that. 2. I am going to send her for a CBC, CMP, C-reactive protein, and sed rate for some baseline labs before starting her on the antibiotics. This will help in dictating care. 3. We previously try to get her for hyperbaric she actually discontinued out of her own accord and unfortunately I think that could have been helpful for her but at this point I do not think that she is wanting to proceed down that road. We will see patient back for reevaluation in 1 week here in the clinic. If anything worsens or changes patient will contact our office for additional recommendations. Electronic Signature(s) Signed: 01/29/2023 5:05:48 PM By: Allen Derry PA-C Entered By: Allen Derry on 01/29/2023 17:05:48 -------------------------------------------------------------------------------- SuperBill Details Patient Name: Date of Service: ZALIKA, TIESZEN 01/29/2023 Medical Record Number: [id number] Patient Account Number: [id number] Date of Birth/Sex: Treating RN: 1966-03-29 (57 y.o. [name], [place] Primary Care Provider: [name] Other Clinician: Referring Provider: Treating Provider/Extender: [name] in Treatment: 33 Diagnosis Coding ICD-10 Codes Code Description [phone] Other chronic osteomyelitis, right ankle and foot E11.621 Type 2 diabetes mellitus with foot ulcer L97.412 Non-pressure chronic ulcer of right heel and midfoot with fat layer exposed I89.0 Lymphedema, not elsewhere classified Facility Procedures : CPT4 Code: [id number] Description: 11042 - DEB SUBQ TISSUE 20 SQ CM/< ICD-10 Diagnosis Description L97.412 Non-pressure chronic ulcer of right heel and midfoot with fat layer exposed E11.621 Type 2 diabetes mellitus with foot ulcer Modifier: Quantity: 1 Physician Procedures : CPT4 Code Description Modifier [id number] - WC PHYS LEVEL 4 -  EST PT 25 ICD-10 Diagnosis Description M86.671 Other chronic osteomyelitis, right ankle and foot E11.621 Type 2 diabetes mellitus with foot ulcer L97.412 Non-pressure chronic ulcer of  right heel and midfoot with fat layer exposed I89.0 Lymphedema, not elsewhere classified Quantity: 1 : [id number] 11042 - WC PHYS SUBQ TISS 20 SQ CM Wisniewski, Camauri ([id number]) [id number] Page 13 Quantity: 1 of 13 : ICD-10 Diagnosis Description L97.412 Non-pressure chronic ulcer of right heel and midfoot with fat layer exposed E11.621 Type 2 diabetes mellitus with foot ulcer Quantity: Electronic Signature(s) Signed: 01/29/2023 5:22:11  PM By: Allen Derry PA-C Entered By: Allen Derry on 01/29/2023 17:22:11

## 2023-01-31 NOTE — Progress Notes (Signed)
[name] ([id number]) [id number] Page 1 of 9 Visit Report for 01/29/2023 Arrival Information Details Patient Name: Date of Service: [name], [name] 01/29/2023 8:45 A M Medical Record Number: [id number] Patient Account Number: [id number] Date of Birth/Sex: Treating RN: 28-Jun-1966 (57 y.o. [name] Primary Care [name]: [name] Other Clinician: Referring [name]: Treating [name]/Extender: [name] in Treatment: 75 Visit Information History Since Last Visit Added or deleted any medications: No Patient Arrived: Ambulatory Any new allergies or adverse reactions: No Arrival Time: 08:47 Had a fall or experienced change in No Accompanied By: son activities of daily living that may affect Transfer Assistance: None risk of falls: Patient Identification Verified: Yes Signs or symptoms of abuse/neglect since last visito No Secondary Verification Process Completed: Yes Hospitalized since last visit: No Patient Requires Transmission-Based Precautions: No Implantable device outside of the clinic excluding No Patient Has Alerts: Yes cellular tissue based products placed in the center Patient Alerts: PICC in left arm since last visit: Has Dressing in Place as Prescribed: Yes Pain Present Now: Yes Electronic Signature(s) Signed: 01/30/2023 4:51:53 PM By: [name] Stall RN, BSN Entered By: [name] on 01/29/2023 08:47:41 -------------------------------------------------------------------------------- Encounter Discharge Information Details Patient Name: Date of Service: [name], [name] 01/29/2023 8:45 A M Medical Record Number: [id number] Patient Account Number: [id number] Date of Birth/Sex: Treating RN: 08-07-1966 (57 y.o. [name] Primary Care [name]: [name] Other Clinician: Referring [name]: Treating Kaycie Pegues/Extender: [name] in Treatment: 15 Encounter Discharge Information  Items Post Procedure Vitals Discharge Condition: Stable Temperature (F): 98.6 Ambulatory Status: Ambulatory Pulse (bpm): 112 Discharge Destination: Home Respiratory Rate (breaths/min): 20 Transportation: Private Auto Blood Pressure (mmHg): 124/90 Accompanied By: son Schedule Follow-up Appointment: Yes Clinical Summary of Care: Electronic Signature(s) Signed: 01/30/2023 4:51:53 PM By: [name] Stall RN, BSN Entered By: [name] on 01/29/2023 09:36:15 -------------------------------------------------------------------------------- Lower Extremity Assessment Details Patient Name: Date of Service: [name], [name] 01/29/2023 8:45 A M Medical Record Number: [id number] Patient Account Number: [id number] Date of Birth/Sex: Treating RN: Jun 25, 1966 (57 y.o. [name] Primary Care [name]: [name] Other Clinician: Referring [name]: Treating [name]/Extender: [name] Weeks in Treatment: 33 Edema Assessment Assessed: [name]: No] [name]: Yes] E[LeftWinona Legato, Arline [name] ([id number])] [Right: [id number] Page 2 of 9] Edema: [Left: Ye] [Right: s] Calf Left: Right: Point of Measurement: 32 cm From Medial Instep 60 cm Ankle Left: Right: Point of Measurement: 10 cm From Medial Instep 32 cm Vascular Assessment Pulses: Dorsalis Pedis Palpable: [Right:Yes] Electronic Signature(s) Signed: 01/30/2023 4:51:53 PM By: [name] Stall RN, BSN Entered By: [name] on 01/29/2023 08:50:27 -------------------------------------------------------------------------------- Multi-Disciplinary Care Plan Details Patient Name: Date of Service: [name], [name] 01/29/2023 8:45 A M Medical Record Number: [id number] Patient Account Number: [id number] Date of Birth/Sex: Treating RN: 06/08/1966 (57 y.o. [name] Primary Care [name]: [name] Other Clinician: Referring [name]: Treating [name]/Extender: [name] in  Treatment: 76 Active Inactive Wound/Skin Impairment Nursing Diagnoses: Impaired tissue integrity Knowledge deficit related to ulceration/compromised skin integrity Goals: Patient will have a decrease in wound volume by X% from date: (specify in notes) Date Initiated: 06/12/2022 Target Resolution Date: 02/07/2023 Goal Status: Active Patient/caregiver will verbalize understanding of skin care regimen Date Initiated: 06/12/2022 Target Resolution Date: 02/07/2023 Goal Status: Active Ulcer/skin breakdown will have a volume reduction of 30% by week 4 Date Initiated: 06/12/2022 Date Inactivated: 07/31/2022 Target Resolution Date: 08/03/2022 Goal Status: Unmet Unmet Reason: dx with osteomyelitis. Ulcer/skin breakdown will have a volume reduction of  50% by week 8 Date Initiated: 06/12/2022 Date Inactivated: 10/16/2022 Target Resolution Date: 10/05/2022 Unmet Reason: see wound Goal Status: Unmet measurement. IV antibiotics Interventions: Assess patient/caregiver ability to obtain necessary supplies Assess patient/caregiver ability to perform ulcer/skin care regimen upon admission and as needed Assess ulceration(s) every visit Notes: Electronic Signature(s) Signed: 01/30/2023 4:51:53 PM By: [name] Stall RN, BSN Entered By: [name] on 01/29/2023 08:56:57 Pain Assessment Details -------------------------------------------------------------------------------- [name] ([id number]) [id number] Page 3 of 9 Patient Name: Date of Service: [name], [name] 01/29/2023 8:45 A M Medical Record Number: [id number] Patient Account Number: [id number] Date of Birth/Sex: Treating RN: [birth year] (57 y.o. [name] Primary Care [name]: [name] Other Clinician: Referring [name]: Treating [name]/Extender: [name] in Treatment: 60 Active Problems Location of Pain Severity and Description of Pain Patient Has Paino Yes Site Locations Pain  Location: Pain in Ulcers Rate the pain. Current Pain Level: 5 Pain Management and Medication Current Pain Management: Medication: No Cold Application: No Rest: No Massage: No Activity: No T.E.N.S.: No Heat Application: No Leg drop or elevation: No Is the Current Pain Management Adequate: Adequate How does your wound impact your activities of daily livingo Sleep: No Bathing: No Appetite: No Relationship With Others: No Bladder Continence: No Emotions: No Bowel Continence: No Work: No Toileting: No Drive: No Dressing: No Hobbies: No Psychologist, prison and probation services) Signed: 01/30/2023 4:51:53 PM By: [name] Stall RN, BSN Entered By: [name] on 01/29/2023 08:47:54 -------------------------------------------------------------------------------- Patient/Caregiver Education Details Patient Name: Date of Service: [name] 4/24/2024andnbsp8:45 A M Medical Record Number: [id number] Patient Account Number: [id number] Date of Birth/Gender: Treating RN: 23-Nov-1965 (57 y.o. [name] Primary Care Physician: [name] Other Clinician: Referring Physician: Treating Physician/Extender: [name] in Treatment: 14 Education Assessment Education Provided To: Patient Education Topics Provided Offloading: Handouts: How Offloading Helps Foot Wounds Heal Methods: Explain/Verbal Responses: Reinforcements needed Duffett, Deniah ([id number]) [id number] Page 4 of 9 Electronic Signature(s) Signed: 01/30/2023 4:51:53 PM By: [name] Stall RN, BSN Entered By: [name] on 01/29/2023 08:57:17 -------------------------------------------------------------------------------- Wound Assessment Details Patient Name: Date of Service: [name], [name] 01/29/2023 8:45 A M Medical Record Number: [id number] Patient Account Number: [id number] Date of Birth/Sex: Treating RN: 1966-05-23 (57 y.o. [name], Millard.Loa Primary Care Armari Fussell: [name]  Other Clinician: Referring Aleese Kamps: Treating Ajanay Farve/Extender: [name] in Treatment: 33 Wound Status Wound Number: 1 Primary Etiology: Diabetic Wound/Ulcer of the Lower Extremity Wound Location: Right Calcaneus Wound Status: Open Wounding Event: Gradually Appeared Comorbid History: Hypertension, Type II Diabetes, Osteomyelitis Date Acquired: 11/07/2020 Weeks Of Treatment: 33 Clustered Wound: Yes Photos Wound Measurements Length: (cm) Width: (cm) Depth: (cm) Clustered Quantity: Area: (cm) Volume: (cm) 0.5 % Reduction in Area: 95.6% 1.3 % Reduction in Volume: 91.2% 0.4 Epithelialization: Medium (34-66%) 2 Tunneling: No 0.511 Undermining: No 0.204 Wound Description Classification: Grade 3 Wound Margin: Thickened Exudate Amount: Medium Exudate Type: Serosanguineous Exudate Color: red, brown Foul Odor After Cleansing: No Slough/Fibrino Yes Wound Bed Granulation Amount: Large (67-100%) Exposed Structure Granulation Quality: Red, Pink Fascia Exposed: No Necrotic Amount: Small (1-33%) Fat Layer (Subcutaneous Tissue) Exposed: Yes Necrotic Quality: Adherent Slough Tendon Exposed: No Muscle Exposed: No Joint Exposed: No Bone Exposed: No Periwound Skin Texture Texture Color No Abnormalities Noted: No No Abnormalities Noted: No Callus: Yes Atrophie Blanche: No Crepitus: No Cyanosis: No Excoriation: No Ecchymosis: No Induration: No Erythema: No Rash: No Hemosiderin Staining: No Scarring: No Mottled: No Pallor: No Knabe, Kenny ([id number]) [id number] Page 5 of 9  Pallor: No Moisture Rubor: No No Abnormalities Noted: No Dry / Scaly: No Temperature / Pain Maceration: Yes Temperature: No Abnormality Tenderness on Palpation: Yes Treatment Notes Wound #1 (Calcaneus) Wound Laterality: Right Cleanser Soap and Water Discharge Instruction: May shower and wash wound with dial antibacterial soap and water prior  to dressing change. Wound Cleanser Discharge Instruction: Cleanse the wound with wound cleanser prior to applying a clean dressing using gauze sponges, not tissue or cotton balls. Peri-Wound Care Sween Lotion (Moisturizing lotion) Discharge Instruction: Apply moisturizing lotion as directed Topical Primary Dressing Sorbalgon AG Dressing 2x2 (in/in) Discharge Instruction: Apply to wound bed as instructed Secondary Dressing ABD Pad, 5x9 Discharge Instruction: Apply over primary dressing as directed. Woven Gauze Sponge, Non-Sterile 4x4 in Discharge Instruction: Apply over primary dressing as directed. heel ortho felt Discharge Instruction: apply in shoe to aid in offloading of heel and wound. Secured With Conforming Stretch Gauze Bandage Roll, Sterile 4x75 (in/in) Discharge Instruction: Secure with stretch gauze as directed. 62M Medipore H Soft Cloth Surgical T ape, 4 x 10 (in/yd) Discharge Instruction: Secure with tape as directed. Compression Wrap Compression Stockings Add-Ons Electronic Signature(s) Signed: 01/30/2023 4:51:53 PM By: [name] Stall RN, BSN Entered By: [name] on 01/29/2023 08:55:50 -------------------------------------------------------------------------------- Wound Assessment Details Patient Name: Date of Service: [name], [name] 01/29/2023 8:45 A M Medical Record Number: [id number] Patient Account Number: [id number] Date of Birth/Sex: Treating RN: 08-24-1966 (57 y.o. [name] Primary Care Dawit Tankard: [name] Other Clinician: Referring Cleburn Maiolo: Treating Kasy Iannacone/Extender: [name] in Treatment: 33 Wound Status Wound Number: 4R Primary Etiology: Diabetic Wound/Ulcer of the Lower Extremity Wound Location: Right, Distal, Plantar Foot Wound Status: Open Wounding Event: Blister Comorbid History: Hypertension, Type II Diabetes, Osteomyelitis Date Acquired: 11/08/2020 Weeks Of Treatment: 15 Clustered Wound:  Yes Photos Baltz, Bibi ([id number]) [id number] Page 6 of 9 Wound Measurements Length: (cm) Width: (cm) Depth: (cm) Clustered Quantity: Area: (cm) Volume: (cm) 0.2 % Reduction in Area: 98.8% 0.2 % Reduction in Volume: 98.8% 0.2 Epithelialization: Large (67-100%) 1 Tunneling: No 0.031 Undermining: No 0.006 Wound Description Classification: Grade 3 Wound Margin: Distinct, outline attached Exudate Amount: Medium Exudate Type: Serosanguineous Exudate Color: red, brown Foul Odor After Cleansing: No Slough/Fibrino No Wound Bed Granulation Amount: Large (67-100%) Exposed Structure Granulation Quality: Red Fascia Exposed: No Necrotic Amount: None Present (0%) Fat Layer (Subcutaneous Tissue) Exposed: Yes Tendon Exposed: No Muscle Exposed: No Joint Exposed: No Bone Exposed: No Periwound Skin Texture Texture Color No Abnormalities Noted: No No Abnormalities Noted: No Callus: Yes Atrophie Blanche: No Crepitus: No Cyanosis: No Excoriation: No Ecchymosis: No Induration: No Erythema: No Rash: No Hemosiderin Staining: No Scarring: No Mottled: No Pallor: No Moisture Rubor: No No Abnormalities Noted: No Dry / Scaly: Yes Maceration: No Treatment Notes Wound #4R (Foot) Wound Laterality: Plantar, Right, Distal Cleanser Soap and Water Discharge Instruction: May shower and wash wound with dial antibacterial soap and water prior to dressing change. Wound Cleanser Discharge Instruction: Cleanse the wound with wound cleanser prior to applying a clean dressing using gauze sponges, not tissue or cotton balls. Peri-Wound Care Sween Lotion (Moisturizing lotion) Discharge Instruction: Apply moisturizing lotion as directed Topical Primary Dressing Sorbalgon AG Dressing 2x2 (in/in) Discharge Instruction: Apply to wound bed as instructed Secondary Dressing ABD Pad, 5x9 Kane, Emmamarie ([id number]) [id number] Page 7 of  9 Discharge Instruction: Apply over primary dressing as directed. Woven Gauze Sponge, Non-Sterile 4x4 in Discharge Instruction: Apply over primary dressing as directed. heel ortho felt Discharge Instruction: apply in shoe  to aid in offloading of heel and wound. Secured With Conforming Stretch Gauze Bandage Roll, Sterile 4x75 (in/in) Discharge Instruction: Secure with stretch gauze as directed. 74M Medipore H Soft Cloth Surgical T ape, 4 x 10 (in/yd) Discharge Instruction: Secure with tape as directed. Compression Wrap Compression Stockings Add-Ons Electronic Signature(s) Signed: 01/30/2023 4:51:53 PM By: [name] Stall RN, BSN Entered By: [name] on 01/29/2023 08:56:13 -------------------------------------------------------------------------------- Wound Assessment Details Patient Name: Date of Service: [name], [name] 01/29/2023 8:45 A M Medical Record Number: [id number] Patient Account Number: [id number] Date of Birth/Sex: Treating RN: 09/19/1966 (57 y.o. [name], Millard.Loa Primary Care Julias Mould: [name] Other Clinician: Referring Reyanne Hussar: Treating Sharif Rendell/Extender: [name] in Treatment: 33 Wound Status Wound Number: 5 Primary Etiology: Lymphedema Wound Location: Right, Lateral Lower Leg Secondary Etiology: Pressure Ulcer Wounding Event: Gradually Appeared Wound Status: Open Date Acquired: 07/07/2022 Comorbid History: Hypertension, Type II Diabetes, Osteomyelitis Weeks Of Treatment: 5 Clustered Wound: No Photos Wound Measurements Length: (cm) 0.5 Width: (cm) 0.6 Depth: (cm) 0.1 Area: (cm) 0.236 Volume: (cm) 0.024 % Reduction in Area: 84.7% % Reduction in Volume: 84.4% Epithelialization: Medium (34-66%) Tunneling: No Undermining: No Wound Description Classification: Full Thickness Without Exposed Suppor Wound Margin: Distinct, outline attached Exudate Amount: Medium Exudate Type: Serosanguineous Exudate Color: red, brown t  Structures Foul Odor After Cleansing: No Slough/Fibrino Yes Wound Bed Granulation Amount: Large (67-100%) Exposed Structure Granulation Quality: Red, Hyper-granulation Fascia Exposed: No Fabel, Rubyann ([id number]) [id number] Page 8 of 9 Necrotic Amount: Small (1-33%) Fat Layer (Subcutaneous Tissue) Exposed: Yes Necrotic Quality: Adherent Slough Tendon Exposed: No Muscle Exposed: No Joint Exposed: No Bone Exposed: No Periwound Skin Texture Texture Color No Abnormalities Noted: No No Abnormalities Noted: No Callus: No Atrophie Blanche: No Crepitus: No Cyanosis: No Excoriation: No Ecchymosis: No Induration: No Erythema: No Rash: No Hemosiderin Staining: Yes Scarring: No Mottled: No Pallor: No Moisture Rubor: No No Abnormalities Noted: No Dry / Scaly: No Temperature / Pain Maceration: No Temperature: Hot Tenderness on Palpation: Yes Treatment Notes Wound #5 (Lower Leg) Wound Laterality: Right, Lateral Cleanser Soap and Water Discharge Instruction: May shower and wash wound with dial antibacterial soap and water prior to dressing change. Wound Cleanser Discharge Instruction: Cleanse the wound with wound cleanser prior to applying a clean dressing using gauze sponges, not tissue or cotton balls. Peri-Wound Care Sween Lotion (Moisturizing lotion) Discharge Instruction: Apply moisturizing lotion as directed Topical Primary Dressing Sorbalgon AG Dressing 2x2 (in/in) Discharge Instruction: Apply to wound bed as instructed Secondary Dressing Zetuvit Plus Silicone Border Dressing 4x4 (in/in) Discharge Instruction: Apply silicone border over primary dressing as directed. Secured With Compression Wrap Compression Stockings Facilities manager) Signed: 01/30/2023 4:51:53 PM By: [name] Stall RN, BSN Entered By: [name] on 01/29/2023 08:56:45 -------------------------------------------------------------------------------- Vitals  Details Patient Name: Date of Service: AKYLA, VAVREK 01/29/2023 8:45 A M Medical Record Number: [id number] Patient Account Number: [id number] Date of Birth/Sex: Treating RN: 1966-06-23 (57 y.o. [name] Primary Care Ahtziry Saathoff: [name] Other Clinician: Referring Dezmon Conover: Treating Juandiego Kolenovic/Extender: [name] in Treatment: 50 Vital Signs Time Taken: 08:48 Temperature (F): 98.6 Height (in): 65 Pulse (bpm): 112 Weight (lbs): 331 Respiratory Rate (breaths/min): 20 Nations, Malayiah ([id number]) [id number] Page 9 of 9 Body Mass Index (BMI): 55.1 Blood Pressure (mmHg): 124/90 Reference Range: 80 - 120 mg / dl Electronic Signature(s) Signed: 01/30/2023 4:51:53 PM By: [name] Stall RN, BSN Entered By: [name] on 01/29/2023 08:48:53

## 2023-02-05 ENCOUNTER — Encounter (HOSPITAL_BASED_OUTPATIENT_CLINIC_OR_DEPARTMENT_OTHER): Payer: No Typology Code available for payment source | Admitting: Physician Assistant

## 2023-02-06 ENCOUNTER — Telehealth (HOSPITAL_COMMUNITY): Payer: Self-pay

## 2023-02-06 ENCOUNTER — Other Ambulatory Visit (HOSPITAL_COMMUNITY): Payer: Self-pay | Admitting: Internal Medicine

## 2023-02-06 DIAGNOSIS — M869 Osteomyelitis, unspecified: Secondary | ICD-10-CM

## 2023-02-06 NOTE — Telephone Encounter (Signed)
Called to schedule picc placement, no answer, left vm. AB

## 2023-02-12 ENCOUNTER — Encounter (HOSPITAL_BASED_OUTPATIENT_CLINIC_OR_DEPARTMENT_OTHER): Payer: No Typology Code available for payment source | Attending: Physician Assistant | Admitting: Physician Assistant

## 2023-02-12 DIAGNOSIS — I890 Lymphedema, not elsewhere classified: Secondary | ICD-10-CM | POA: Insufficient documentation

## 2023-02-12 DIAGNOSIS — E11621 Type 2 diabetes mellitus with foot ulcer: Secondary | ICD-10-CM | POA: Insufficient documentation

## 2023-02-12 DIAGNOSIS — L97412 Non-pressure chronic ulcer of right heel and midfoot with fat layer exposed: Secondary | ICD-10-CM | POA: Diagnosis not present

## 2023-02-12 DIAGNOSIS — M86671 Other chronic osteomyelitis, right ankle and foot: Secondary | ICD-10-CM | POA: Insufficient documentation

## 2023-02-12 NOTE — Progress Notes (Signed)
Woerner, [name] ([id number]) [id number] Page 1 of 9 Visit Report for 02/12/2023 Arrival Information Details Patient Name: Date of Service: [name], [name] 02/12/2023 9:30 A M Medical Record Number: [id number] Patient Account Number: [id number] Date of Birth/Sex: Treating RN: 1965/11/09 (57 y.o. [name], [name] Primary Care Dyneshia Baccam: [name] Other Clinician: Referring Lanell Carpenter: Treating Irasema Chalk/Extender: [name] in Treatment: 35 Visit Information History Since Last Visit All ordered tests and consults were completed: No Patient Arrived: Ambulatory Added or deleted any medications: No Arrival Time: 09:44 Any new allergies or adverse reactions: No Accompanied By: son Had a fall or experienced change in No Transfer Assistance: None activities of daily living that may affect Patient Identification Verified: Yes risk of falls: Secondary Verification Process Completed: Yes Signs or symptoms of abuse/neglect since last visito No Patient Requires Transmission-Based Precautions: No Hospitalized since last visit: No Patient Has Alerts: Yes Implantable device outside of the clinic excluding No Patient Alerts: PICC in left arm cellular tissue based products placed in the center since last visit: Has Dressing in Place as Prescribed: Yes Pain Present Now: Yes Notes per patient has not got her bloodwork done. Per patient tomorrow PICC Line placed at 0845. Ceilidh Torregrossa made aware. Electronic Signature(s) Signed: 02/12/2023 11:31:33 AM By: Shawn Stall RN, BSN Entered By: Shawn Stall on 02/12/2023 09:45:39 -------------------------------------------------------------------------------- Encounter Discharge Information Details Patient Name: Date of Service: [name], [name] 02/12/2023 9:30 A M Medical Record Number: [id number] Patient Account Number: [id number] Date of Birth/Sex: Treating RN: 10-Aug-1966 (57 y.o. [name] Primary Care Edinson Domeier:  [name] Other Clinician: Referring Jadalyn Oliveri: Treating Kevyn Wengert/Extender: [name] in Treatment: 3 Encounter Discharge Information Items Post Procedure Vitals Discharge Condition: Stable Temperature (F): 98.4 Ambulatory Status: Ambulatory Pulse (bpm): 106 Discharge Destination: Home Respiratory Rate (breaths/min): 20 Transportation: Private Auto Blood Pressure (mmHg): 160/91 Accompanied By: son Schedule Follow-up Appointment: Yes Clinical Summary of Care: Electronic Signature(s) Signed: 02/12/2023 11:31:33 AM By: Shawn Stall RN, BSN Entered By: Shawn Stall on 02/12/2023 10:02:08 -------------------------------------------------------------------------------- Lower Extremity Assessment Details Patient Name: Date of Service: [name], [name] 02/12/2023 9:30 A M Medical Record Number: [id number] Patient Account Number: [id number] Date of Birth/Sex: Treating RN: June 28, 1966 (57 y.o. [name] Primary Care Kooper Godshall: [name] Other Clinician: Referring Tamon Parkerson: Treating Cristel Rail/Extender: [name] in Treatment: [address] [place] ([id number]) [id number] Page 2 of 9 Edema Assessment Assessed: [Left: No] [Right: Yes] Edema: [Left: Ye] [Right: s] Calf Left: Right: Point of Measurement: 32 cm From Medial Instep 58 cm Ankle Left: Right: Point of Measurement: 10 cm From Medial Instep 32 cm Vascular Assessment Pulses: Dorsalis Pedis Palpable: [Right:Yes] Electronic Signature(s) Signed: 02/12/2023 11:31:33 AM By: Shawn Stall RN, BSN Entered By: Shawn Stall on 02/12/2023 09:47:11 -------------------------------------------------------------------------------- Multi-Disciplinary Care Plan Details Patient Name: Date of Service: [name], [name] 02/12/2023 9:30 A M Medical Record Number: [id number] Patient Account Number: [id number] Date of Birth/Sex: Treating RN: 08-21-1966 (57 y.o. [name] Primary Care Xitlali Kastens: [name] Other Clinician: Referring Lonia Roane: Treating Argie Applegate/Extender: [name] in Treatment: 14 Active Inactive Wound/Skin Impairment Nursing Diagnoses: Impaired tissue integrity Knowledge deficit related to ulceration/compromised skin integrity Goals: Patient will have a decrease in wound volume by X% from date: (specify in notes) Date Initiated: 06/12/2022 Target Resolution Date: 03/07/2023 Goal Status: Active Patient/caregiver will verbalize understanding of skin care regimen Date Initiated: 06/12/2022 Target Resolution Date: 03/07/2023 Goal Status: Active Ulcer/skin breakdown will have a volume reduction of 30% by week 4  Date Initiated: 06/12/2022 Date Inactivated: 07/31/2022 Target Resolution Date: 08/03/2022 Goal Status: Unmet Unmet Reason: dx with osteomyelitis. Ulcer/skin breakdown will have a volume reduction of 50% by week 8 Date Initiated: 06/12/2022 Date Inactivated: 10/16/2022 Target Resolution Date: 10/05/2022 Unmet Reason: see wound Goal Status: Unmet measurement. IV antibiotics Interventions: Assess patient/caregiver ability to obtain necessary supplies Assess patient/caregiver ability to perform ulcer/skin care regimen upon admission and as needed Assess ulceration(s) every visit Notes: Electronic Signature(s) Signed: 02/12/2023 11:31:33 AM By: Shawn Stall RN, BSN Entered By: Shawn Stall on 02/12/2023 09:50:40 [name], [name] ([id number]) [id number] Page 3 of 9 -------------------------------------------------------------------------------- Pain Assessment Details Patient Name: Date of Service: [name], [name] 02/12/2023 9:30 A M Medical Record Number: [id number] Patient Account Number: [id number] Date of Birth/Sex: Treating RN: 18-Jun-1966 (57 y.o. [name] Primary Care Naraya Stoneberg: [name] Other Clinician: Referring Maebelle Sulton: Treating Ryder Man/Extender: [name] in Treatment: 38 Active Problems Location of Pain Severity and Description of Pain Patient Has Paino Yes Site Locations Pain Location: Pain in Ulcers Rate the pain. Current Pain Level: 5 Pain Management and Medication Current Pain Management: Electronic Signature(s) Signed: 02/12/2023 11:31:33 AM By: Shawn Stall RN, BSN Entered By: Shawn Stall on 02/12/2023 09:46:19 -------------------------------------------------------------------------------- Patient/Caregiver Education Details Patient Name: Date of Service: [name] 5/8/2024andnbsp9:30 A M Medical Record Number: [id number] Patient Account Number: [id number] Date of Birth/Gender: Treating RN: 12/29/1965 (57 y.o. [name] Primary Care Physician: [name] Other Clinician: Referring Physician: Treating Physician/Extender: [name] in Treatment: 81 Education Assessment Education Provided To: Patient Education Topics Provided Wound/Skin Impairment: Handouts: Caring for Your Ulcer Methods: Explain/Verbal Responses: Reinforcements needed Electronic Signature(s) Signed: 02/12/2023 11:31:33 AM By: Shawn Stall RN, BSN Entered By: Shawn Stall on 02/12/2023 09:50:52 [name], Annia ([id number]) [id number] Page 4 of 9 -------------------------------------------------------------------------------- Wound Assessment Details Patient Name: Date of Service: [name], [name] 02/12/2023 9:30 A M Medical Record Number: [id number] Patient Account Number: [id number] Date of Birth/Sex: Treating RN: April 16, 1966 (57 y.o. [name], [name] Primary Care Delois Silvester: [name] Other Clinician: Referring Sherell Christoffel: Treating Peighton Mehra/Extender: [name] in Treatment: 35 Wound Status Wound Number: 1 Primary Etiology: Diabetic Wound/Ulcer of the Lower Extremity Wound Location: Right Calcaneus Wound Status: Open Wounding Event:  Gradually Appeared Comorbid History: Hypertension, Type II Diabetes, Osteomyelitis Date Acquired: 11/07/2020 Weeks Of Treatment: 35 Clustered Wound: Yes Photos Wound Measurements Length: (cm) Width: (cm) Depth: (cm) Clustered Quantity: Area: (cm) Volume: (cm) 0.4 % Reduction in Area: 96.5% 1.3 % Reduction in Volume: 93% 0.4 Epithelialization: Medium (34-66%) 2 Tunneling: No 0.408 Undermining: No 0.163 Wound Description Classification: Grade 3 Wound Margin: Thickened Exudate Amount: Medium Exudate Type: Serosanguineous Exudate Color: red, brown Foul Odor After Cleansing: No Slough/Fibrino Yes Wound Bed Granulation Amount: Large (67-100%) Exposed Structure Granulation Quality: Red, Pink Fascia Exposed: No Necrotic Amount: Small (1-33%) Fat Layer (Subcutaneous Tissue) Exposed: Yes Necrotic Quality: Adherent Slough Tendon Exposed: No Muscle Exposed: No Joint Exposed: No Bone Exposed: No Periwound Skin Texture Texture Color No Abnormalities Noted: No No Abnormalities Noted: No Callus: Yes Atrophie Blanche: No Crepitus: No Cyanosis: No Excoriation: No Ecchymosis: No Induration: No Erythema: No Rash: No Hemosiderin Staining: No Scarring: No Mottled: No Pallor: No Moisture Rubor: No No Abnormalities Noted: No Dry / Scaly: No Temperature / Pain Maceration: Yes Temperature: No Abnormality Tenderness on Palpation: Yes Treatment Notes Merwin, Amanat ([id number]) [id number] Page 5 of 9 Wound #1 (Calcaneus) Wound Laterality: Right Cleanser Soap and Water Discharge Instruction: May shower and wash  wound with dial antibacterial soap and water prior to dressing change. Wound Cleanser Discharge Instruction: Cleanse the wound with wound cleanser prior to applying a clean dressing using gauze sponges, not tissue or cotton balls. Peri-Wound Care Sween Lotion (Moisturizing lotion) Discharge Instruction: Apply moisturizing lotion as  directed Topical Primary Dressing Sorbalgon AG Dressing 2x2 (in/in) Discharge Instruction: Apply to wound bed as instructed Secondary Dressing ABD Pad, 5x9 Discharge Instruction: Apply over primary dressing as directed. Woven Gauze Sponge, Non-Sterile 4x4 in Discharge Instruction: Apply over primary dressing as directed. heel ortho felt Discharge Instruction: apply in shoe to aid in offloading of heel and wound. Secured With Conforming Stretch Gauze Bandage Roll, Sterile 4x75 (in/in) Discharge Instruction: Secure with stretch gauze as directed. 41M Medipore H Soft Cloth Surgical T ape, 4 x 10 (in/yd) Discharge Instruction: Secure with tape as directed. Compression Wrap Compression Stockings Add-Ons Electronic Signature(s) Signed: 02/12/2023 11:31:33 AM By: Shawn Stall RN, BSN Entered By: Shawn Stall on 02/12/2023 09:49:24 -------------------------------------------------------------------------------- Wound Assessment Details Patient Name: Date of Service: [name], [name] 02/12/2023 9:30 A M Medical Record Number: [id number] Patient Account Number: [id number] Date of Birth/Sex: Treating RN: 10-06-1966 (57 y.o. [name] Primary Care Acel Natzke: [name] Other Clinician: Referring Maycee Blasco: Treating Lorea Kupfer/Extender: [name] Weeks in Treatment: 35 Wound Status Wound Number: 4R Primary Etiology: Diabetic Wound/Ulcer of the Lower Extremity Wound Location: Right, Distal, Plantar Foot Wound Status: Open Wounding Event: Blister Comorbid History: Hypertension, Type II Diabetes, Osteomyelitis Date Acquired: 11/08/2020 Weeks Of Treatment: 17 Clustered Wound: Yes Photos Lipke, Zykera ([id number]) [id number] Page 6 of 9 Wound Measurements Length: (cm) Width: (cm) Depth: (cm) Clustered Quantity: Area: (cm) Volume: (cm) 0.5 % Reduction in Area: 96.9% 0.2 % Reduction in Volume: 96.8% 0.2 Epithelialization: Large (67-100%) 1  Tunneling: No 0.079 Undermining: No 0.016 Wound Description Classification: Grade 3 Wound Margin: Distinct, outline attached Exudate Amount: Medium Exudate Type: Serosanguineous Exudate Color: red, brown Foul Odor After Cleansing: No Slough/Fibrino No Wound Bed Granulation Amount: Large (67-100%) Exposed Structure Granulation Quality: Red Fascia Exposed: No Necrotic Amount: None Present (0%) Fat Layer (Subcutaneous Tissue) Exposed: Yes Tendon Exposed: No Muscle Exposed: No Joint Exposed: No Bone Exposed: No Periwound Skin Texture Texture Color No Abnormalities Noted: No No Abnormalities Noted: No Callus: Yes Atrophie Blanche: No Crepitus: No Cyanosis: No Excoriation: No Ecchymosis: No Induration: No Erythema: No Rash: No Hemosiderin Staining: No Scarring: No Mottled: No Pallor: No Moisture Rubor: No No Abnormalities Noted: No Dry / Scaly: Yes Maceration: No Treatment Notes Wound #4R (Foot) Wound Laterality: Plantar, Right, Distal Cleanser Soap and Water Discharge Instruction: May shower and wash wound with dial antibacterial soap and water prior to dressing change. Wound Cleanser Discharge Instruction: Cleanse the wound with wound cleanser prior to applying a clean dressing using gauze sponges, not tissue or cotton balls. Peri-Wound Care Sween Lotion (Moisturizing lotion) Discharge Instruction: Apply moisturizing lotion as directed Topical Primary Dressing Sorbalgon AG Dressing 2x2 (in/in) Discharge Instruction: Apply to wound bed as instructed Secondary Dressing ABD Pad, 5x9 Caswell, Kieanna ([id number]) [id number] Page 7 of 9 Discharge Instruction: Apply over primary dressing as directed. Woven Gauze Sponge, Non-Sterile 4x4 in Discharge Instruction: Apply over primary dressing as directed. heel ortho felt Discharge Instruction: apply in shoe to aid in offloading of heel and wound. Secured With Conforming Stretch Gauze Bandage  Roll, Sterile 4x75 (in/in) Discharge Instruction: Secure with stretch gauze as directed. 41M Medipore H Soft Cloth Surgical T ape, 4 x 10 (in/yd) Discharge Instruction: Secure with tape  as directed. Compression Wrap Compression Stockings Add-Ons Electronic Signature(s) Signed: 02/12/2023 11:31:33 AM By: Shawn Stall RN, BSN Entered By: Shawn Stall on 02/12/2023 09:49:48 -------------------------------------------------------------------------------- Wound Assessment Details Patient Name: Date of Service: [name], [name] 02/12/2023 9:30 A M Medical Record Number: [id number] Patient Account Number: [id number] Date of Birth/Sex: Treating RN: 09-26-1966 (57 y.o. [name], [name] Primary Care Tammala Weider: [name] Other Clinician: Referring Ramla Hase: Treating Quan Cybulski/Extender: [name] in Treatment: 35 Wound Status Wound Number: 5 Primary Etiology: Lymphedema Wound Location: Right, Lateral Lower Leg Secondary Etiology: Pressure Ulcer Wounding Event: Gradually Appeared Wound Status: Open Date Acquired: 07/07/2022 Comorbid History: Hypertension, Type II Diabetes, Osteomyelitis Weeks Of Treatment: 7 Clustered Wound: No Photos Wound Measurements Length: (cm) 0.6 Width: (cm) 0.4 Depth: (cm) 0.1 Area: (cm) 0.188 Volume: (cm) 0.019 % Reduction in Area: 87.8% % Reduction in Volume: 87.7% Epithelialization: Medium (34-66%) Tunneling: No Undermining: No Wound Description Classification: Full Thickness Without Exposed Support Structures Wound Margin: Distinct, outline attached Exudate Amount: Medium Exudate Type: Serosanguineous Exudate Color: red, brown Foul Odor After Cleansing: No Slough/Fibrino Yes Wound Bed Granulation Amount: Small (1-33%) Exposed Structure Granulation Quality: Red, Hyper-granulation Fascia Exposed: No Cullers, Adyn ([id number]) [id number] Page 8 of 9 Necrotic Amount: Large (67-100%) Fat Layer  (Subcutaneous Tissue) Exposed: Yes Necrotic Quality: Adherent Slough Tendon Exposed: No Muscle Exposed: No Joint Exposed: No Bone Exposed: No Periwound Skin Texture Texture Color No Abnormalities Noted: No No Abnormalities Noted: No Callus: No Atrophie Blanche: No Crepitus: No Cyanosis: No Excoriation: No Ecchymosis: No Induration: No Erythema: No Rash: No Hemosiderin Staining: Yes Scarring: No Mottled: No Pallor: No Moisture Rubor: No No Abnormalities Noted: No Dry / Scaly: No Temperature / Pain Maceration: No Temperature: Hot Tenderness on Palpation: Yes Treatment Notes Wound #5 (Lower Leg) Wound Laterality: Right, Lateral Cleanser Soap and Water Discharge Instruction: May shower and wash wound with dial antibacterial soap and water prior to dressing change. Wound Cleanser Discharge Instruction: Cleanse the wound with wound cleanser prior to applying a clean dressing using gauze sponges, not tissue or cotton balls. Peri-Wound Care Sween Lotion (Moisturizing lotion) Discharge Instruction: Apply moisturizing lotion as directed Topical Primary Dressing Sorbalgon AG Dressing 2x2 (in/in) Discharge Instruction: Apply to wound bed as instructed Secondary Dressing Zetuvit Plus Silicone Border Dressing 4x4 (in/in) Discharge Instruction: Apply silicone border over primary dressing as directed. Secured With Compression Wrap Compression Stockings Facilities manager) Signed: 02/12/2023 11:31:33 AM By: Shawn Stall RN, BSN Entered By: Shawn Stall on 02/12/2023 09:49:04 -------------------------------------------------------------------------------- Vitals Details Patient Name: Date of Service: NORMAN, KOLER 02/12/2023 9:30 A M Medical Record Number: [id number] Patient Account Number: [id number] Date of Birth/Sex: Treating RN: [birth year] (57 y.o. [name] Primary Care Kelii Chittum: [name] Other Clinician: Referring Karlita Lichtman: Treating  Tyeshia Cornforth/Extender: [name] in Treatment: 35 Vital Signs Time Taken: 09:45 Temperature (F): 98.4 Height (in): 65 Pulse (bpm): 106 Weight (lbs): 331 Respiratory Rate (breaths/min): 22 Stinger, Remas ([id number]) [id number] Page 9 of 9 Body Mass Index (BMI): 55.1 Blood Pressure (mmHg): 160/91 Reference Range: 80 - 120 mg / dl Electronic Signature(s) Signed: 02/12/2023 11:31:33 AM By: Shawn Stall RN, BSN Entered By: Shawn Stall on 02/12/2023 09:45:59

## 2023-02-12 NOTE — Progress Notes (Signed)
[name], [name] ([id number]) [id number] Page 1 of 12 Visit Report for 02/12/2023 Chief Complaint Document Details Patient Name: Date of Service: [name], [name] 02/12/2023 9:30 A M Medical Record Number: [id number] Patient Account Number: [id number] Date of Birth/Sex: Treating RN: 10-Oct-1965 (57 y.o. F) Primary Care Provider: [name] Other Clinician: Referring Provider: Treating Provider/Extender: [name] in Treatment: 35 Information Obtained from: Patient Chief Complaint Right heel ulcer Electronic Signature(s) Signed: 02/12/2023 9:49:19 AM By: Allen Derry PA-C Entered By: Allen Derry on 02/12/2023 09:49:19 -------------------------------------------------------------------------------- Debridement Details Patient Name: Date of Service: [name], [name] 02/12/2023 9:30 A M Medical Record Number: [id number] Patient Account Number: [id number] Date of Birth/Sex: Treating RN: [birth year] (57 y.o. [name], Millard.Loa Primary Care Provider: [name] Other Clinician: Referring Provider: Treating Provider/Extender: [name] in Treatment: 35 Debridement Performed for Assessment: Wound #1 Right Calcaneus Performed By: Physician Lenda Kelp, PA Debridement Type: Debridement Severity of Tissue Pre Debridement: Fat layer exposed Level of Consciousness (Pre-procedure): Awake and Alert Pre-procedure Verification/Time Out Yes - 09:55 Taken: Start Time: 09:56 Pain Control: Lidocaine 5% topical ointment Percent of Wound Bed Debrided: 100% T Area Debrided (cm): otal 0.59 Tissue and other material debrided: Viable, Non-Viable, Callus, Slough, Subcutaneous, Skin: Dermis , Skin: Epidermis, Biofilm, Slough Level: Skin/Subcutaneous Tissue Debridement Description: Excisional Instrument: Curette Bleeding: Minimum Hemostasis Achieved: Pressure End Time: 10:00 Procedural Pain: 0 Post Procedural Pain: 0 Response [name]  Treatment: Procedure was tolerated well Level of Consciousness (Post- Awake and Alert procedure): Post Debridement Measurements of Total Wound Length: (cm) 0.5 Width: (cm) 1.5 Depth: (cm) 0.4 Volume: (cm) 0.236 Character of Wound/Ulcer Post Debridement: Improved Severity of Tissue Post Debridement: Fat layer exposed Post Procedure Diagnosis Same as Pre-procedure Electronic Signature(s) Unsigned Entered By: Shawn Stall on 02/12/2023 10:00:15 Bainter, Nataleah ([id number]) [id number] Page 2 of 12 -------------------------------------------------------------------------------- Debridement Details Patient Name: Date of Service: [name], [name] 02/12/2023 9:30 A M Medical Record Number: [id number] Patient Account Number: [id number] Date of Birth/Sex: Treating RN: April 21, 1966 (57 y.o. [name], Millard.Loa Primary Care Provider: [name] Other Clinician: Referring Provider: Treating Provider/Extender: [name] in Treatment: 35 Debridement Performed for Assessment: Wound #4R Right,Distal,Plantar Foot Performed By: Physician Lenda Kelp, PA Debridement Type: Debridement Severity of Tissue Pre Debridement: Fat layer exposed Level of Consciousness (Pre-procedure): Awake and Alert Pre-procedure Verification/Time Out Yes - 09:55 Taken: Start Time: 09:56 Pain Control: Lidocaine 5% topical ointment Percent of Wound Bed Debrided: 100% T Area Debrided (cm): otal 0.24 Tissue and other material debrided: Viable, Non-Viable, Callus, Slough, Subcutaneous, Skin: Dermis , Skin: Epidermis, Biofilm, Slough Level: Skin/Subcutaneous Tissue Debridement Description: Excisional Instrument: Curette Bleeding: Minimum Hemostasis Achieved: Pressure End Time: 10:00 Procedural Pain: 0 Post Procedural Pain: 0 Response [name] Treatment: Procedure was tolerated well Level of Consciousness (Post- Awake and Alert procedure): Post Debridement Measurements of  Total Wound Length: (cm) 0.6 Width: (cm) 0.5 Depth: (cm) 0.1 Volume: (cm) 0.024 Character of Wound/Ulcer Post Debridement: Improved Severity of Tissue Post Debridement: Fat layer exposed Post Procedure Diagnosis Same as Pre-procedure Electronic Signature(s) Unsigned Entered By: Shawn Stall on 02/12/2023 10:00:34 -------------------------------------------------------------------------------- Debridement Details Patient Name: Date of Service: [name], [name] 02/12/2023 9:30 A M Medical Record Number: [id number] Patient Account Number: [id number] Date of Birth/Sex: Treating RN: 1966/03/17 (57 y.o. [name] Primary Care Provider: [name] Other Clinician: Referring Provider: Treating Provider/Extender: [name] in Treatment: 35 Debridement Performed for Assessment: Wound #5 Right,Lateral Lower Leg Performed By: Physician Larina Bras  III, Leonard Schwartz, PA Debridement Type: Debridement Level of Consciousness (Pre-procedure): Awake and Alert Pre-procedure Verification/Time Out Yes - 09:55 Taken: Start Time: 09:56 Pain Control: Lidocaine 5% topical ointment Percent of Wound Bed Debrided: 100% T Area Debrided (cm): otal 0.19 Quackenbush, Aaleah ([id number]) [id number] Page 3 of 12 Tissue and other material debrided: Viable, Non-Viable, Slough, Subcutaneous, Skin: Dermis , Skin: Epidermis, Biofilm, Slough Level: Skin/Subcutaneous Tissue Debridement Description: Excisional Instrument: Curette Bleeding: Minimum Hemostasis Achieved: Pressure End Time: 10:00 Procedural Pain: 0 Post Procedural Pain: 0 Response [name] Treatment: Procedure was tolerated well Level of Consciousness (Post- Awake and Alert procedure): Post Debridement Measurements of Total Wound Length: (cm) 0.6 Width: (cm) 0.4 Depth: (cm) 0.1 Volume: (cm) 0.019 Character of Wound/Ulcer Post Debridement: Improved Post Procedure Diagnosis Same as Pre-procedure Electronic  Signature(s) Unsigned Entered By: Shawn Stall on 02/12/2023 10:01:06 -------------------------------------------------------------------------------- HPI Details Patient Name: Date of Service: [name], [name] 02/12/2023 9:30 A M Medical Record Number: [id number] Patient Account Number: [id number] Date of Birth/Sex: Treating RN: [birth year] (57 y.o. F) Primary Care Provider: [name] Other Clinician: Referring Provider: Treating Provider/Extender: [name] in Treatment: 35 History of Present Illness HPI Description: 06-13-2022 upon evaluation today patient presents for evaluation of her right heel ulcer. She is having a tremendous amount of pain at this location. She tells me that her most recent hemoglobin A1c was 8.7 and that was on 08-23-2021. This ulcer on the heel has been present she states since February 2023 when she had an injection [name] the heel by podiatry. She states that she began [name] have increasing pain the wound opened and has been open ever since. 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 She had an x-ray in June if anything can although there did not appear [name] be any obvious evidence of osteomyelitis at that point. She subsequently has had OR debridement several times of the wound performed by podiatry. Patient does have a history of lymphedema of the lower extremities she is also a type II diabetic. 06-19-2022 upon evaluation today patient appears [name] be doing better in regard [name] the size of her leg which is significantly improved. With that being said she had a lot of drainage from the heel which is completely understandable considering what is going on at this time. There does not appear [name] be any evidence of active infection there is no warmth or irritation [name] the leg in general. With that being said the patient does have an issue here with the amount of drainage that is going on I definitely think Zetuvit is good [name] be the  better way [name] go in place of ABD pads. Also think that she could potentially benefit from 3 times per week dressing changes but again right now we are going [name] stick with the [name] change it today, change in her Friday, and then subsequently seeing where things stand next Wednesday. The other option would be [name] change her [name] a different day for wound care having her come then say like a Tuesday Friday or Monday Thursday. 06-26-2022 upon evaluation patient's wound bed actually showed signs of doing well in regard [name] the overall size it was measuring slightly smaller. With that being said unfortunately the biggest issue we see is she still has a tremendous amount of drainage which is what could keep this from being able [name] use a total contact cast. For that reason I am did going [name] discuss with her today the possibility of trying Tubigrip  [name] see if that could be of benefit for her. She voiced understanding and is in agreement with giving this a try. 07-03-2022 upon evaluation today patient unfortunately appears [name] be doing significantly worse at this point in regard [name] her swelling due [name] the fact that she was unable [name] really wear the Tubigrip. She tells me that it was cutting into her. With that being said she also did have an MRI this MRI revealed that she does have osteomyelitis in the heel this was actually just performed Monday, 25 September. This does show signs of "early osteomyelitis". Nonetheless this is still concerning [name] me. The wound also appears [name] be getting deeper in the center aspect of this which has me a little concerned as well. I do believe that she is likely going require an aggressive approach here [name] try [name] get things better. I discussed that in greater detail with her today. I will detailed in the plan. 07-17-2022 upon evaluation today patient appears [name] be doing poorly in regard [name] her wound. Unfortunately she does not show any signs of infection systemically though locally this  seems [name] be doing worse with the wound actually being bigger than where it was previous. She did have an MRI that showed evidence of osteomyelitis actually recommended a referral [name] ID she was evaluated infectious disease and they recommended referral [name] podiatry and [name] be honest have recommended amputation based on what the patient tells me today. With that being said this is something that she is not interested in at all [name] be perfectly honest. For that reason she wants [name] know what she should do and where she should go at this point that is where the majority of the conversation which was quite lengthy today went. Obviously understand her concern here but I think she is going [name] have [name] definitely get off this and likely this means she is going [name] need [name] come out of work. 10/18Cristy Folks, [name] ([id number]) [id number] Page 4 of 12 Since the patient was the patient is using Hydrofera Blue on her right heel wound. She has a new larger open area superior [name] the wound. The skin on the bottom of her foot is completely macerated. I reviewed the infectious disease note on this patient from 10/9. They recommended keeping the patient on Delofloxacin 450 twice daily until follow-up tomorrow. The patient states she is not going back there as the only thing they wanted to do was "cut my leg off]. She has not seen podiatry. Her lab works shows a CRP markedly elevated at 240.2 a sedimentation rate of 96 the rest of her CBC is normal creatinine of 0.38 The patient has been [name] see her primary doctor who is Dr. Leonides Schanz [SP[facility]] In [place]. According the patient Dr. Loralee Pacas has ordered vancomycin and Zosyn [name] start on November 1. She is not taking the delofloxacin that was suggested by infectious disease. Very angry that they just wanted [name] consider her for an amputation although I do not specifically see that stated in their note 07-31-2022 upon evaluation today patient still has a significant  wound over her plantar aspect of her right l foot region. With that being said I am definitely concerned about the fact that the patient probably does need [name] be in hyperbaric oxygen therapy at this point. I think we should try [name] get this going as quickly as possible. She actually is going [name] be set up with a PICC line next Wednesday and then following this  we will have the vancomycin and Zosyn that she will be taking for 8 weeks according [name] what she tells me. I think this is definitely going [name] be beneficial and again the goal here is limb salvage. In combination with that I think the hyperbaric oxygen therapy would be ultimately significantly helpful for her. 08-07-2022 upon evaluation today patient appears [name] still be doing quite poorly overall she still has not gotten her antibiotics. She got her PICC line today which I assumed she will be getting the first dose of antibiotics as well during that time. With that being said unfortunately she did not get the antibiotics and in fact as I questioned her further she does not even know when or if that is going [name] be started this week. Again I am very concerned about this considering she already has a PICC line in place we do not want this [name] clot off on top of the fact that she should already be on these antibiotics she just never went [name] the ER for evaluation therefore they never got this started. At this point based on what I am seeing I really think that the ideal thing would be for [place] [name] see about getting her [name] the ER for further evaluation and treatment [name] see if they can get this started for her as soon as possible. The patient voiced understanding and is in agreement with the plan. I gave her recommendations for what should be done also called [facility] infusion therapy in order [name] see if they would be the ones that seem [name] be available for her IV infusions but they tell me that they no longer do this that is apparently where the orders were sent [name]  for her infusion therapy. That was by her primary care provider. 08-21-2022 upon evaluation today patient's wound on the bottom of the heel and foot area as well as the toe appear [name] be doing significantly better. Fortunately I do not see any evidence of infection locally or systemically and everything is measuring much smaller than where we have been. The wound in the left gluteal region also is dramatically improved compared [name] last time she was here. She has started the antibiotics this is cefepime and vancomycin. She did have a somewhat high trough and therefore they have her hold that today and they are [name] recheck in the morning I believe she told me. Nonetheless with the antibiotics going she seems [name] be doing significantly better which is great news. 08-28-2022 upon evaluation today patient appears [name] be doing somewhat better in regard [name] her foot there is definitely less drainage than what we have seen in the past. I do believe the antibiotics are helping. With that being said she is also been approved for hyperbaric oxygen therapy and I think as soon as we get this started the better. I discussed that with her today as well. With that being said I do think that the last thing she needs is her chest x-ray when she has that done will be ready [name] start her into treatment. She is going [name] try [name] go get that today when she leaves which I think would be ideal. 09-11-2022 upon evaluation today patient appears [name] be doing well with regard [name] her foot ulcer. This is showing signs of no digression and she does not appear [name] be having as much drainage as before though it still very wet is not nearly the extreme of what it was previous. Fortunately there does  not appear [name] be any signs of active infection locally nor systemically at this point which is great news. Patient did have her chest x-ray as well as EKG and she is actually approved and completely cleared for HBO therapy from both a clinical  standpoint as well as an insurance standpoint. 09-25-2022 upon evaluation today patient appears [name] be doing well in regard [name] her foot ulcer. Unfortunately she is not doing as well when it comes [name] her hyperbaric treatments. She is having a lot of trouble with claustrophobia. She tells me in general that she is not sure she is good [name] be able [name] continue. 10-09-2022 upon evaluation today patient's wounds actually are showing signs of excellent improvement which is great news. Fortunately I do not see any evidence of infection locally or systemically which is great news and overall I am extremely pleased with where things are currently. I do think that she is making good progress she does tell me at this point today she does not want [name] go forward with a hyperbaric oxygen therapy. 10-16-2022 upon evaluation today patient appears [name] be doing excellent in regard [name] her foot ulcer. This is actually showing signs of excellent improvement. She still does not allow for any sharp debridement but the good news is this is doing much better and does not even need it at the moment. Nonetheless as far as sharp debridement is concerned this has been too painful for really not been able to do that all along. 10-23-2022 upon evaluation today patient actually is making excellent progress. I am extremely pleased with where we stand I have considered even seeing about putting her in the total contact cast but [name] be honest she is doing so well currently and still with having some drainage I feel like it is better for her [name] be able [name] change it then [name] be locked up in the cast for a week at a time. She has voiced understanding as well. Overall though I feel like you are making some really good progress here. 10-30-2022 upon evaluation today patient appears [name] be doing excellent in regard [name] her foot ulcers. Both are showing signs of improvement which is great news and overall I feel like that they are measuring smaller,  looking better, and I see no signs of resurgence of the infection which is great news. Overall I am extremely pleased at this point. 2/7; the patient has 2 wounds which are remanence of the large wound on the right foot. This was a diabetic wound with underlying osteomyelitis she has completed antibiotics. Today she has the open area on the distal calcaneus and an area on the right midfoot which is eschared. She has been using silver alginate and offloading this in a regular running shoe. 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 11-27-2022 upon evaluation today patient appears [name] be doing well currently in regard [name] her wound which is actually showing signs of significant improvement this is great news. Fortunately there does not appear [name] be any signs of active infection locally nor systemically which is also excellent. No fevers, chills, nausea, vomiting, or diarrhea. 12-25-2022 upon evaluation today patient appears [name] be doing well currently in regard [name] the distal portion of her foot which is actually in my opinion looking a little better but have a lot of callus she is going require some debridement here. With that being said the heel actually looks  like it might be a little bit worse. There is a lot of callus I cannot tell exactly what is open or not we can remove that and we will see what exactly were dealing with there. With that being said she also has an area on the right side of her leg which appears [name] be more of a pressure injury she tells me this is from sitting in her recliner she has gotten a pillow [name] help take care of this. Fortunately it is not technically open and draining at this point made it very well may be before it is also not done. 01-01-2023 upon evaluation today patient appears [name] be doing well currently in regard [name] her wounds I feel like the cefdinir has helped she seems [name] be improved compared [name] last  week's evaluation. I do not see any signs of active infection locally nor systemically at this time which is great news. No fevers, chills, nausea, vomiting, or diarrhea. 01-08-2023 upon evaluation today patient's wounds on the foot appear [name] be doing decently well the leg is still about the same. With that being said unfortunately she does have a lot of erythema on the leg in particular which is unfortunate and definitely not what we are looking for. Again I do not think that the [facility] has really done well for her in that regard. 01-15-2023 upon evaluation today patient appears [name] be doing well currently in regard [name] her wound. Overall I think that things are measuring a little bit better but unfortunately the erythema is spreading. I did review her culture today. Unfortunately it does show that she does have MRSA noted and again I think that we need to do something [name] try [name] get this under control as quickly as possible. She has previously been on IV antibiotics and unfortunately that has not been sufficient [name] get this completely cleared it seems [name] have come back at this point and she really wants [name] avoid going back on IV antibiotics. We have been using oral antibiotics and various forms the most recent was actually a course of cefdinir. She is also previously been on clindamycin and even in the past Miranda, Manroop ([id number]) [id number] Page 5 of 12 she has had [name] doxycycline. With that being said with all things considered I think we are still having a lot of trouble here keeping this infection under control. We previously attempted hyperbarics but the patient did not tolerate this well. She therefore discontinued HBO therapy. 01-22-2023 upon evaluation today patient appears [name] be doing a little better in regard [name] the cellulitis on her right leg. I am much more pleased with what I am seeing today. With that being said unfortunately she is still having quite a bit of  issues here with erythema though not as bad I think that were not completely clear. Unfortunately we were denied for the [facility] prescription. She has the medication but I provided for her by way of samples but nothing further. 01-29-2023 upon evaluation today patient appears [name] be doing poorly currently in regard [name] her leg. I do not feel like she is showing as much improvement as she was within desirable now that we switch her [name] linezolid. With that being said I do believe that we probably need [name] see about IV antibiotics I recommended a referral back [name] ID. With that being said she would prefer [name] see her physician which I am okay with but either way I think she needs [name] be back on  IV antibiotics for a minimum of 2 months as soon as possible. I am going [name] send her for lab work today in order [name] get some baseline labs but honestly I think that we are going [name] require IV antibiotics [name] get this under control. 02-12-2023 upon evaluation today patient appears [name] be doing well currently in regard [name] her wound all things considered. She has not gotten the lab work done yet that we ordered a couple weeks back. She tells me she has had a lot going on with the death of the family. Fortunately there does not appear [name] be any signs of worsening in fact some of the heel looks a little bit better which is great news. Fortunately I do not see any evidence of active infection locally nor systemically which is great news. Electronic Signature(s) Signed: 02/12/2023 10:10:43 AM By: Allen Derry PA-C Entered By: Allen Derry on 02/12/2023 10:10:43 -------------------------------------------------------------------------------- Physical Exam Details Patient Name: Date of Service: [name], [name] 02/12/2023 9:30 A M Medical Record Number: [id number] Patient Account Number: [id number] Date of Birth/Sex: Treating RN: [birth year] (57 y.o. F) Primary Care Provider: [name] Other Clinician: Referring Provider: Treating  Provider/Extender: [name] in Treatment: 35 Constitutional Obese and well-hydrated in no acute distress. Respiratory normal breathing without difficulty. Psychiatric this patient is able [name] make decisions and demonstrates good insight into disease process. Alert and Oriented x 3. pleasant and cooperative. Notes Upon inspection patient's wound bed actually showed signs of good granulation epithelization at this point. Fortunately I do not see any evidence of active infection systemically at this point which is great news. I do feel like she is making progress here although this is still a pretty significant wound that we are dealing with. Electronic Signature(s) Signed: 02/12/2023 10:11:11 AM By: Allen Derry PA-C Entered By: Allen Derry on 02/12/2023 10:11:11 -------------------------------------------------------------------------------- Physician Orders Details Patient Name: Date of Service: [name], [name] 02/12/2023 9:30 A M Medical Record Number: [id number] Patient Account Number: [id number] Date of Birth/Sex: Treating RN: 1966/03/26 (57 y.o. [name], Yvonne Kendall Primary Care Provider: [name] Other Clinician: Referring Provider: Treating Provider/Extender: [name] in Treatment: 37 Verbal / Phone Orders: No Diagnosis Coding ICD-10 Coding Code Description [phone] Other chronic osteomyelitis, right ankle and foot E11.621 Type 2 diabetes mellitus with foot ulcer L97.412 Non-pressure chronic ulcer of right heel and midfoot with fat layer exposed I89.0 Lymphedema, not elsewhere classified Follow-up Appointments Resnik, Athziri ([id number]) [id number] Page 6 of 12 ppointment in 1 week. Leonard Schwartz Wednesday 0930 02/19/2023 room 9 Return A ppointment in 2 weeks. Leonard Schwartz Wednesday 1030 02/26/2023 room 9 Return A Other: - PICC line 02/13/2023 with [facility]. Anesthetic (In clinic) Topical Lidocaine 5% applied [name] wound  bed Bathing/ Shower/ Hygiene May shower with protection but do not get wound dressing(s) wet. Protect dressing(s) with water repellant cover (for example, large plastic bag) or a cast cover and may then take shower. Edema Control - Lymphedema / SCD / Other Elevate legs [name] the level of the heart or above for 30 minutes daily and/or when sitting for 3-4 times a day throughout the day. Avoid standing for long periods of time. Off-Loading Other: - Minimize walking and standing as much as possible [name] right foot [name] aid in offloading [name] wound. While at work use the wheelchair for mobility. felt in shoe [name] aid in offloading wound and heel. Wound Treatment Wound #1 - Calcaneus Wound Laterality: Right Cleanser: Soap and Water 1 x Per  Day/30 Days Discharge Instructions: May shower and wash wound with dial antibacterial soap and water prior [name] dressing change. Cleanser: Wound Cleanser (Generic) 1 x Per Day/30 Days Discharge Instructions: Cleanse the wound with wound cleanser prior [name] applying a clean dressing using gauze sponges, not tissue or cotton balls. Peri-Wound Care: Sween Lotion (Moisturizing lotion) 1 x Per Day/30 Days Discharge Instructions: Apply moisturizing lotion as directed Prim Dressing: Sorbalgon AG Dressing 2x2 (in/in) (Generic) 1 x Per Day/30 Days ary Discharge Instructions: Apply [name] wound bed as instructed Secondary Dressing: ABD Pad, 5x9 (Generic) 1 x Per Day/30 Days Discharge Instructions: Apply over primary dressing as directed. Secondary Dressing: Woven Gauze Sponge, Non-Sterile 4x4 in (Generic) 1 x Per Day/30 Days Discharge Instructions: Apply over primary dressing as directed. Secondary Dressing: heel ortho felt 1 x Per Day/30 Days Discharge Instructions: apply in shoe [name] aid in offloading of heel and wound. Secured With: Web designer, Sterile 4x75 (in/in) (Generic) 1 x Per Day/30 Days Discharge Instructions: Secure with stretch gauze as  directed. Secured With: 56M Medipore H Soft Cloth Surgical T ape, 4 x 10 (in/yd) (Generic) 1 x Per Day/30 Days Discharge Instructions: Secure with tape as directed. Wound #4R - Foot Wound Laterality: Plantar, Right, Distal Cleanser: Soap and Water 1 x Per Day/30 Days Discharge Instructions: May shower and wash wound with dial antibacterial soap and water prior [name] dressing change. Cleanser: Wound Cleanser (Generic) 1 x Per Day/30 Days Discharge Instructions: Cleanse the wound with wound cleanser prior [name] applying a clean dressing using gauze sponges, not tissue or cotton balls. Peri-Wound Care: Sween Lotion (Moisturizing lotion) 1 x Per Day/30 Days Discharge Instructions: Apply moisturizing lotion as directed Prim Dressing: Sorbalgon AG Dressing 2x2 (in/in) (Generic) 1 x Per Day/30 Days ary Discharge Instructions: Apply [name] wound bed as instructed Secondary Dressing: ABD Pad, 5x9 (Generic) 1 x Per Day/30 Days Discharge Instructions: Apply over primary dressing as directed. Secondary Dressing: Woven Gauze Sponge, Non-Sterile 4x4 in 1 x Per Day/30 Days Discharge Instructions: Apply over primary dressing as directed. Secondary Dressing: heel ortho felt 1 x Per Day/30 Days Discharge Instructions: apply in shoe [name] aid in offloading of heel and wound. Secured With: Web designer, Sterile 4x75 (in/in) (Generic) 1 x Per Day/30 Days Discharge Instructions: Secure with stretch gauze as directed. Secured With: 56M Medipore H Soft Cloth Surgical T ape, 4 x 10 (in/yd) 1 x Per Day/30 Days Discharge Instructions: Secure with tape as directed. Wound #5 - Lower Leg Wound Laterality: Right, Lateral Borgeson, Gae ([id number]) [id number] Page 7 of 12 Cleanser: Soap and Water 1 x Per Day/30 Days Discharge Instructions: May shower and wash wound with dial antibacterial soap and water prior [name] dressing change. Cleanser: Wound Cleanser (Generic) 1 x Per Day/30  Days Discharge Instructions: Cleanse the wound with wound cleanser prior [name] applying a clean dressing using gauze sponges, not tissue or cotton balls. Peri-Wound Care: Sween Lotion (Moisturizing lotion) 1 x Per Day/30 Days Discharge Instructions: Apply moisturizing lotion as directed Prim Dressing: Sorbalgon AG Dressing 2x2 (in/in) (Generic) 1 x Per Day/30 Days ary Discharge Instructions: Apply [name] wound bed as instructed Secondary Dressing: Zetuvit Plus Silicone Border Dressing 4x4 (in/in) (Generic) 1 x Per Day/30 Days Discharge Instructions: Apply silicone border over primary dressing as directed. Electronic Signature(s) Unsigned Entered By: Shawn Stall on 02/12/2023 09:59:23 -------------------------------------------------------------------------------- Problem List Details Patient Name: Date of Service: AMANEE, [name] 02/12/2023 9:30 A M Medical Record Number: [id number] Patient Account Number: [id number] Date of  Birth/Sex: Treating RN: 06-08-1966 (57 y.o. 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 F) Primary Care Provider: [name] Other ClinicianDarral Dash ([id number]) [id number] Page 8 of 12 Referring Provider: Treating Provider/Extender: [name] in Treatment: 35 Subjective Chief Complaint Information obtained from Patient Right heel ulcer History of Present Illness (HPI) 06-13-2022 upon evaluation today patient presents for evaluation of her right heel ulcer. She is having a tremendous amount of pain at this location. She tells me that her most recent hemoglobin A1c was 8.7 and that was on 08-23-2021. This ulcer on the heel has been present she states since February 2023 when she had an injection [name] the heel by podiatry. She states that she began [name] have increasing pain the wound opened and has been open ever since. 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 She had an x-ray in June if anything can although there did not appear [name] be any obvious evidence of osteomyelitis at that point. She subsequently has had OR debridement several times of the wound performed by podiatry. Patient does have a history of lymphedema of the lower extremities she is also a type II diabetic. 06-19-2022 upon evaluation today patient appears [name] be doing better in regard [name] the size of her leg which is significantly improved. With that being said she had a lot of drainage from the heel which is completely understandable considering what is going on at this time. There does not appear [name] be any evidence of active infection there is no warmth or irritation [name] the leg in general. With that being said the patient does have an issue here with the amount of drainage that is going on I definitely think Zetuvit is good [name] be the better way [name] go in place of ABD pads. Also think that she could potentially benefit from 3 times per week dressing changes but again right now we are going [name]  stick with the [name] change it today, change in her Friday, and then subsequently seeing where things stand next Wednesday. The other option would be [name] change her [name] a different day for wound care having her come then say like a Tuesday Friday or Monday Thursday. 06-26-2022 upon evaluation patient's wound bed actually showed signs of doing well in regard [name] the overall size it was measuring slightly smaller. With that being said unfortunately the biggest issue we see is she still has a tremendous amount of drainage which is what could keep this from being able [name] use a total contact cast. For that reason I am did going [name] discuss with her today the possibility of trying Tubigrip [name] see if that could be of benefit for her. She voiced understanding and is in agreement with giving this a  try. 07-03-2022 upon evaluation today patient unfortunately appears [name] be doing significantly worse at this point in regard [name] her swelling due [name] the fact that she was unable [name] really wear the Tubigrip. She tells me that it was cutting into her. With that being said she also did have an MRI this MRI revealed that she does have osteomyelitis in the heel this was actually just performed Monday, 25 September. This does show signs of "early osteomyelitis". Nonetheless this is still concerning [name] me. The wound also appears [name] be getting deeper in the center aspect of this which has me a little concerned as well. I do believe that she is likely going require an aggressive approach here [name] try [name] get things better. I discussed that in greater detail with her today. I will detailed in the plan. 07-17-2022 upon evaluation today patient appears [name] be doing poorly in regard [name] her wound. Unfortunately she does not show any signs of infection systemically though locally this seems [name] be doing worse with the wound actually being bigger than where it was previous. She did have an MRI that showed evidence of osteomyelitis actually  recommended a referral [name] ID she was evaluated infectious disease and they recommended referral [name] podiatry and [name] be honest have recommended amputation based on what the patient tells me today. With that being said this is something that she is not interested in at all [name] be perfectly honest. For that reason she wants [name] know what she should do and where she should go at this point that is where the majority of the conversation which was quite lengthy today went. Obviously understand her concern here but I think she is going [name] have [name] definitely get off this and likely this means she is going [name] need [name] come out of work. 10/18; Since the patient was the patient is using Hydrofera Blue on her right heel wound. She has a new larger open area superior [name] the wound. The skin on the bottom of her foot is completely macerated. I reviewed the infectious disease note on this patient from 10/9. They recommended keeping the patient on Delofloxacin 450 twice daily until follow-up tomorrow. The patient states she is not going back there as the only thing they wanted to do was "cut my leg off]. She has not seen podiatry. Her lab works shows a CRP markedly elevated at 240.2 a sedimentation rate of 96 the rest of her CBC is normal creatinine of 0.38 The patient has been [name] see her primary doctor who is Dr. Leonides Schanz [SP[facility]] In [place]. According the patient Dr. Loralee Pacas has ordered vancomycin and Zosyn [name] start on November 1. She is not taking the delofloxacin that was suggested by infectious disease. Very angry that they just wanted [name] consider her for an amputation although I do not specifically see that stated in their note 07-31-2022 upon evaluation today patient still has a significant wound over her plantar aspect of her right l foot region. With that being said I am definitely concerned about the fact that the patient probably does need [name] be in hyperbaric oxygen therapy at this point. I think we should try [name] get  this going as quickly as possible. She actually is going [name] be set up with a PICC line next Wednesday and then following this we will have the vancomycin and Zosyn that she will be taking for 8 weeks according [name] what she tells me. I think this is definitely going [name] be  beneficial and again the goal here is limb salvage. In combination with that I think the hyperbaric oxygen therapy would be ultimately significantly helpful for her. 08-07-2022 upon evaluation today patient appears [name] still be doing quite poorly overall she still has not gotten her antibiotics. She got her PICC line today which I assumed she will be getting the first dose of antibiotics as well during that time. With that being said unfortunately she did not get the antibiotics and in fact as I questioned her further she does not even know when or if that is going [name] be started this week. Again I am very concerned about this considering she already has a PICC line in place we do not want this [name] clot off on top of the fact that she should already be on these antibiotics she just never went [name] the ER for evaluation therefore they never got this started. At this point based on what I am seeing I really think that the ideal thing would be for [place] [name] see about getting her [name] the ER for further evaluation and treatment [name] see if they can get this started for her as soon as possible. The patient voiced understanding and is in agreement with the plan. I gave her recommendations for what should be done also called [facility] infusion therapy in order [name] see if they would be the ones that seem [name] be available for her IV infusions but they tell me that they no longer do this that is apparently where the orders were sent [name] for her infusion therapy. That was by her primary care provider. 08-21-2022 upon evaluation today patient's wound on the bottom of the heel and foot area as well as the toe appear [name] be doing significantly better. Fortunately I do not  see any evidence of infection locally or systemically and everything is measuring much smaller than where we have been. The wound in the left gluteal region also is dramatically improved compared [name] last time she was here. She has started the antibiotics this is cefepime and vancomycin. She did have a somewhat high trough and therefore they have her hold that today and they are [name] recheck in the morning I believe she told me. Nonetheless with the antibiotics going she seems [name] be doing significantly better which is great news. 08-28-2022 upon evaluation today patient appears [name] be doing somewhat better in regard [name] her foot there is definitely less drainage than what we have seen in the past. I do believe the antibiotics are helping. With that being said she is also been approved for hyperbaric oxygen therapy and I think as soon as we get this started the better. I discussed that with her today as well. With that being said I do think that the last thing she needs is her chest x-ray when she has that done will be ready [name] start her into treatment. She is going [name] try [name] go get that today when she leaves which I think would be ideal. 09-11-2022 upon evaluation today patient appears [name] be doing well with regard [name] her foot ulcer. This is showing signs of no digression and she does not appear [name] be having as much drainage as before though it still very wet is not nearly the extreme of what it was previous. Fortunately there does not appear [name] be any signs of active infection locally nor systemically at this point which is great news. Patient did have her chest x-ray as well as EKG  and she is actually approved and completely cleared for HBO therapy from both a clinical standpoint as well as an Barista. Donley, [name] ([id number]) [id number] Page 9 of 12 09-25-2022 upon evaluation today patient appears [name] be doing well in regard [name] her foot ulcer. Unfortunately she is  not doing as well when it comes [name] her hyperbaric treatments. She is having a lot of trouble with claustrophobia. She tells me in general that she is not sure she is good [name] be able [name] continue. 10-09-2022 upon evaluation today patient's wounds actually are showing signs of excellent improvement which is great news. Fortunately I do not see any evidence of infection locally or systemically which is great news and overall I am extremely pleased with where things are currently. I do think that she is making good progress she does tell me at this point today she does not want [name] go forward with a hyperbaric oxygen therapy. 10-16-2022 upon evaluation today patient appears [name] be doing excellent in regard [name] her foot ulcer. This is actually showing signs of excellent improvement. She still does not allow for any sharp debridement but the good news is this is doing much better and does not even need it at the moment. Nonetheless as far as sharp debridement is concerned this has been too painful for really not been able to do that all along. 10-23-2022 upon evaluation today patient actually is making excellent progress. I am extremely pleased with where we stand I have considered even seeing about putting her in the total contact cast but [name] be honest she is doing so well currently and still with having some drainage I feel like it is better for her [name] be able [name] change it then [name] be locked up in the cast for a week at a time. She has voiced understanding as well. Overall though I feel like you are making some really good progress here. 10-30-2022 upon evaluation today patient appears [name] be doing excellent in regard [name] her foot ulcers. Both are showing signs of improvement which is great news and overall I feel like that they are measuring smaller, looking better, and I see no signs of resurgence of the infection which is great news. Overall I am extremely pleased at this point. 2/7; the patient has 2 wounds which  are remanence of the large wound on the right foot. This was a diabetic wound with underlying osteomyelitis she has completed antibiotics. Today she has the open area on the distal calcaneus and an area on the right midfoot which is eschared. She has been using silver alginate and offloading this in a regular running shoe. 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 11-27-2022 upon evaluation today patient appears [name] be doing well currently in regard [name] her wound which is actually showing signs of significant improvement this is great news. Fortunately there does not appear [name] be any signs of active infection locally nor systemically which is also excellent. No fevers, chills, nausea, vomiting, or diarrhea. 12-25-2022 upon evaluation today patient appears [name] be doing well currently in regard [name] the distal portion of her foot which is actually in my opinion looking a little better but have a lot of callus she is going require some debridement here. With that being said the heel actually looks like it might be a little bit worse. There is a lot of callus I cannot tell exactly what is open  or not we can remove that and we will see what exactly were dealing with there. With that being said she also has an area on the right side of her leg which appears [name] be more of a pressure injury she tells me this is from sitting in her recliner she has gotten a pillow [name] help take care of this. Fortunately it is not technically open and draining at this point made it very well may be before it is also not done. 01-01-2023 upon evaluation today patient appears [name] be doing well currently in regard [name] her wounds I feel like the cefdinir has helped she seems [name] be improved compared [name] last week's evaluation. I do not see any signs of active infection locally nor systemically at this time which is great news. No fevers, chills, nausea, vomiting, or  diarrhea. 01-08-2023 upon evaluation today patient's wounds on the foot appear [name] be doing decently well the leg is still about the same. With that being said unfortunately she does have a lot of erythema on the leg in particular which is unfortunate and definitely not what we are looking for. Again I do not think that the [facility] has really done well for her in that regard. 01-15-2023 upon evaluation today patient appears [name] be doing well currently in regard [name] her wound. Overall I think that things are measuring a little bit better but unfortunately the erythema is spreading. I did review her culture today. Unfortunately it does show that she does have MRSA noted and again I think that we need to do something [name] try [name] get this under control as quickly as possible. She has previously been on IV antibiotics and unfortunately that has not been sufficient [name] get this completely cleared it seems [name] have come back at this point and she really wants [name] avoid going back on IV antibiotics. We have been using oral antibiotics and various forms the most recent was actually a course of cefdinir. She is also previously been on clindamycin and even in the past she has had [name] doxycycline. With that being said with all things considered I think we are still having a lot of trouble here keeping this infection under control. We previously attempted hyperbarics but the patient did not tolerate this well. She therefore discontinued HBO therapy. 01-22-2023 upon evaluation today patient appears [name] be doing a little better in regard [name] the cellulitis on her right leg. I am much more pleased with what I am seeing today. With that being said unfortunately she is still having quite a bit of issues here with erythema though not as bad I think that were not completely clear. Unfortunately we were denied for the [facility] prescription. She has the medication but I provided for her by way of samples but nothing further. 01-29-2023 upon  evaluation today patient appears [name] be doing poorly currently in regard [name] her leg. I do not feel like she is showing as much improvement as she was within desirable now that we switch her [name] linezolid. With that being said I do believe that we probably need [name] see about IV antibiotics I recommended a referral back [name] ID. With that being said she would prefer [name] see her physician which I am okay with but either way I think she needs [name] be back on IV antibiotics for a minimum of 2 months as soon as possible. I am going [name] send her for lab work today in order [name] get some baseline labs  but honestly I think that we are going [name] require IV antibiotics [name] get this under control. 02-12-2023 upon evaluation today patient appears [name] be doing well currently in regard [name] her wound all things considered. She has not gotten the lab work done yet that we ordered a couple weeks back. She tells me she has had a lot going on with the death of the family. Fortunately there does not appear [name] be any signs of worsening in fact some of the heel looks a little bit better which is great news. Fortunately I do not see any evidence of active infection locally nor systemically which is great news. Objective Constitutional Obese and well-hydrated in no acute distress. Vitals Time Taken: 9:45 AM, Height: 65 in, Weight: 331 lbs, BMI: 55.1, Temperature: 98.4 F, Pulse: 106 bpm, Respiratory Rate: 22 breaths/min, Blood Pressure: 160/91 mmHg. Respiratory normal breathing without difficulty. Psychiatric this patient is able [name] make decisions and demonstrates good insight into disease process. Alert and Oriented x 3. pleasant and cooperative. Swallows, [name] ([id number]) [id number] Page 10 of 12 General Notes: Upon inspection patient's wound bed actually showed signs of good granulation epithelization at this point. Fortunately I do not see any evidence of active infection systemically at this point which  is great news. I do feel like she is making progress here although this is still a pretty significant wound that we are dealing with. Integumentary (Hair, Skin) Wound #1 status is Open. Original cause of wound was Gradually Appeared. The date acquired was: 11/07/2020. The wound has been in treatment 35 weeks. The wound is located on the Right Calcaneus. The wound measures 0.4cm length x 1.3cm width x 0.4cm depth; 0.408cm^2 area and 0.163cm^3 volume. There is Fat Layer (Subcutaneous Tissue) exposed. There is no tunneling or undermining noted. There is a medium amount of serosanguineous drainage noted. The wound margin is thickened. There is large (67-100%) red, pink granulation within the wound bed. There is a small (1-33%) amount of necrotic tissue within the wound bed including Adherent Slough. The periwound skin appearance exhibited: Callus, Maceration. The periwound skin appearance did not exhibit: Crepitus, Excoriation, Induration, Rash, Scarring, Dry/Scaly, Atrophie Blanche, Cyanosis, Ecchymosis, Hemosiderin Staining, Mottled, Pallor, Rubor, Erythema. Periwound temperature was noted as No Abnormality. The periwound has tenderness on palpation. Wound #4R status is Open. Original cause of wound was Blister. The date acquired was: 11/08/2020. The wound has been in treatment 17 weeks. The wound is located on the Right,Distal,Plantar Foot. The wound measures 0.5cm length x 0.2cm width x 0.2cm depth; 0.079cm^2 area and 0.016cm^3 volume. There is Fat Layer (Subcutaneous Tissue) exposed. There is no tunneling or undermining noted. There is a medium amount of serosanguineous drainage noted. The wound margin is distinct with the outline attached [name] the wound base. There is large (67-100%) red granulation within the wound bed. There is no necrotic tissue within the wound bed. The periwound skin appearance exhibited: Callus, Dry/Scaly. The periwound skin appearance did not exhibit: Crepitus, Excoriation,  Induration, Rash, Scarring, Maceration, Atrophie Blanche, Cyanosis, Ecchymosis, Hemosiderin Staining, Mottled, Pallor, Rubor, Erythema. Wound #5 status is Open. Original cause of wound was Gradually Appeared. The date acquired was: 07/07/2022. The wound has been in treatment 7 weeks. The wound is located on the Right,Lateral Lower Leg. The wound measures 0.6cm length x 0.4cm width x 0.1cm depth; 0.188cm^2 area and 0.019cm^3 volume. There is Fat Layer (Subcutaneous Tissue) exposed. There is no tunneling or undermining noted. There is a medium amount of serosanguineous drainage noted. The wound  margin is distinct with the outline attached [name] the wound base. There is small (1-33%) red, hyper - granulation within the wound bed. There is a large (67-100%) amount of necrotic tissue within the wound bed including Adherent Slough. The periwound skin appearance exhibited: Hemosiderin Staining. The periwound skin appearance did not exhibit: Callus, Crepitus, Excoriation, Induration, Rash, Scarring, Dry/Scaly, Maceration, Atrophie Blanche, Cyanosis, Ecchymosis, Mottled, Pallor, Rubor, Erythema. Periwound temperature was noted as Hot. The periwound has tenderness on palpation. Assessment Active Problems ICD-10 Other chronic osteomyelitis, right ankle and foot Type 2 diabetes mellitus with foot ulcer Non-pressure chronic ulcer of right heel and midfoot with fat layer exposed Lymphedema, not elsewhere classified Procedures Wound #1 Pre-procedure diagnosis of Wound #1 is a Diabetic Wound/Ulcer of the Lower Extremity located on the Right Calcaneus .Severity of Tissue Pre Debridement is: Fat layer exposed. There was a Excisional Skin/Subcutaneous Tissue Debridement with a total area of 0.59 sq cm performed by Lenda Kelp, PA. With the following instrument(s): Curette [name] remove Viable and Non-Viable tissue/material. Material removed includes Callus, Subcutaneous Tissue, Slough, Skin: Dermis, Skin: Epidermis,  and Biofilm after achieving pain control using Lidocaine 5% topical ointment. A time out was conducted at 09:55, prior [name] the start of the procedure. A Minimum amount of bleeding was controlled with Pressure. The procedure was tolerated well with a pain level of 0 throughout and a pain level of 0 following the procedure. Post Debridement Measurements: 0.5cm length x 1.5cm width x 0.4cm depth; 0.236cm^3 volume. Character of Wound/Ulcer Post Debridement is improved. Severity of Tissue Post Debridement is: Fat layer exposed. Post procedure Diagnosis Wound #1: Same as Pre-Procedure Wound #4R Pre-procedure diagnosis of Wound #4R is a Diabetic Wound/Ulcer of the Lower Extremity located on the Right,Distal,Plantar Foot .Severity of Tissue Pre Debridement is: Fat layer exposed. There was a Excisional Skin/Subcutaneous Tissue Debridement with a total area of 0.24 sq cm performed by Lenda Kelp, PA. With the following instrument(s): Curette [name] remove Viable and Non-Viable tissue/material. Material removed includes Callus, Subcutaneous Tissue, Slough, Skin: Dermis, Skin: Epidermis, and Biofilm after achieving pain control using Lidocaine 5% topical ointment. A time out was conducted at 09:55, prior [name] the start of the procedure. A Minimum amount of bleeding was controlled with Pressure. The procedure was tolerated well with a pain level of 0 throughout and a pain level of 0 following the procedure. Post Debridement Measurements: 0.6cm length x 0.5cm width x 0.1cm depth; 0.024cm^3 volume. Character of Wound/Ulcer Post Debridement is improved. Severity of Tissue Post Debridement is: Fat layer exposed. Post procedure Diagnosis Wound #4R: Same as Pre-Procedure Wound #5 Pre-procedure diagnosis of Wound #5 is a Lymphedema located on the Right,Lateral Lower Leg . There was a Excisional Skin/Subcutaneous Tissue Debridement with a total area of 0.19 sq cm performed by Lenda Kelp, PA. With the following  instrument(s): Curette [name] remove Viable and Non-Viable tissue/material. Material removed includes Subcutaneous Tissue, Slough, Skin: Dermis, Skin: Epidermis, and Biofilm after achieving pain control using Lidocaine 5% topical ointment. A time out was conducted at 09:55, prior [name] the start of the procedure. A Minimum amount of bleeding was controlled with Pressure. The procedure was tolerated well with a pain level of 0 throughout and a pain level of 0 following the procedure. Post Debridement Measurements: 0.6cm length x 0.4cm width x 0.1cm depth; 0.019cm^3 volume. Character of Wound/Ulcer Post Debridement is improved. Post procedure Diagnosis Wound #5: Same as Pre-Procedure Plan Follow-up Appointments: [name], [name] ([id number]) [id number] Page 11 of 12 Return  Appointment in 1 week. Leonard Schwartz Wednesday 0930 02/19/2023 room 9 Return Appointment in 2 weeks. Leonard Schwartz Wednesday 1030 02/26/2023 room 9 Other: - PICC line 02/13/2023 with [facility]. Anesthetic: (In clinic) Topical Lidocaine 5% applied [name] wound bed Bathing/ Shower/ Hygiene: May shower with protection but do not get wound dressing(s) wet. Protect dressing(s) with water repellant cover (for example, large plastic bag) or a cast cover and may then take shower. Edema Control - Lymphedema / SCD / Other: Elevate legs [name] the level of the heart or above for 30 minutes daily and/or when sitting for 3-4 times a day throughout the day. Avoid standing for long periods of time. Off-Loading: Other: - Minimize walking and standing as much as possible [name] right foot [name] aid in offloading [name] wound. While at work use the wheelchair for mobility. felt in shoe [name] aid in offloading wound and heel. WOUND #1: - Calcaneus Wound Laterality: Right Cleanser: Soap and Water 1 x Per Day/30 Days Discharge Instructions: May shower and wash wound with dial antibacterial soap and water prior [name] dressing change. Cleanser: Wound Cleanser (Generic) 1 x  Per Day/30 Days Discharge Instructions: Cleanse the wound with wound cleanser prior [name] applying a clean dressing using gauze sponges, not tissue or cotton balls. Peri-Wound Care: Sween Lotion (Moisturizing lotion) 1 x Per Day/30 Days Discharge Instructions: Apply moisturizing lotion as directed Prim Dressing: Sorbalgon AG Dressing 2x2 (in/in) (Generic) 1 x Per Day/30 Days ary Discharge Instructions: Apply [name] wound bed as instructed Secondary Dressing: ABD Pad, 5x9 (Generic) 1 x Per Day/30 Days Discharge Instructions: Apply over primary dressing as directed. Secondary Dressing: Woven Gauze Sponge, Non-Sterile 4x4 in (Generic) 1 x Per Day/30 Days Discharge Instructions: Apply over primary dressing as directed. Secondary Dressing: heel ortho felt 1 x Per Day/30 Days Discharge Instructions: apply in shoe [name] aid in offloading of heel and wound. Secured With: Web designer, Sterile 4x75 (in/in) (Generic) 1 x Per Day/30 Days Discharge Instructions: Secure with stretch gauze as directed. Secured With: 34M Medipore H Soft Cloth Surgical T ape, 4 x 10 (in/yd) (Generic) 1 x Per Day/30 Days Discharge Instructions: Secure with tape as directed. WOUND #4R: - Foot Wound Laterality: Plantar, Right, Distal Cleanser: Soap and Water 1 x Per Day/30 Days Discharge Instructions: May shower and wash wound with dial antibacterial soap and water prior [name] dressing change. Cleanser: Wound Cleanser (Generic) 1 x Per Day/30 Days Discharge Instructions: Cleanse the wound with wound cleanser prior [name] applying a clean dressing using gauze sponges, not tissue or cotton balls. Peri-Wound Care: Sween Lotion (Moisturizing lotion) 1 x Per Day/30 Days Discharge Instructions: Apply moisturizing lotion as directed Prim Dressing: Sorbalgon AG Dressing 2x2 (in/in) (Generic) 1 x Per Day/30 Days ary Discharge Instructions: Apply [name] wound bed as instructed Secondary Dressing: ABD Pad, 5x9 (Generic) 1 x Per  Day/30 Days Discharge Instructions: Apply over primary dressing as directed. Secondary Dressing: Woven Gauze Sponge, Non-Sterile 4x4 in 1 x Per Day/30 Days Discharge Instructions: Apply over primary dressing as directed. Secondary Dressing: heel ortho felt 1 x Per Day/30 Days Discharge Instructions: apply in shoe [name] aid in offloading of heel and wound. Secured With: Web designer, Sterile 4x75 (in/in) (Generic) 1 x Per Day/30 Days Discharge Instructions: Secure with stretch gauze as directed. Secured With: 34M Medipore H Soft Cloth Surgical T ape, 4 x 10 (in/yd) 1 x Per Day/30 Days Discharge Instructions: Secure with tape as directed. WOUND #5: - Lower Leg Wound Laterality: Right,  Lateral Cleanser: Soap and Water 1 x Per Day/30 Days Discharge Instructions: May shower and wash wound with dial antibacterial soap and water prior [name] dressing change. Cleanser: Wound Cleanser (Generic) 1 x Per Day/30 Days Discharge Instructions: Cleanse the wound with wound cleanser prior [name] applying a clean dressing using gauze sponges, not tissue or cotton balls. Peri-Wound Care: Sween Lotion (Moisturizing lotion) 1 x Per Day/30 Days Discharge Instructions: Apply moisturizing lotion as directed Prim Dressing: Sorbalgon AG Dressing 2x2 (in/in) (Generic) 1 x Per Day/30 Days ary Discharge Instructions: Apply [name] wound bed as instructed Secondary Dressing: Zetuvit Plus Silicone Border Dressing 4x4 (in/in) (Generic) 1 x Per Day/30 Days Discharge Instructions: Apply silicone border over primary dressing as directed. 1. She is supposed [name] be getting her PICC line tomorrow and then subsequently she tells me she is going [name] get the lab work done as well before the initiation of the antibiotics. 2. She is supposed [name] be getting started with IV antibiotics and as soon as that gets started obviously things will start [name] hopefully improve we will be able [name] get this closed and get her where we want her  [name] be as far as willing is concerned. The patient voiced understanding. We will see patient back for reevaluation in 1 week here in the clinic. If anything worsens or changes patient will contact our office for additional recommendations. Electronic Signature(s) Signed: 02/12/2023 10:11:40 AM By: Allen Derry PA-C Entered By: Allen Derry on 02/12/2023 10:11:40 -------------------------------------------------------------------------------- SuperBill Details Patient Name: Date of Service: [name], [name] 02/12/2023 Medical Record Number: [id number] Patient Account Number: [id number] Date of Birth/Sex: Treating RN: 1966/08/30 (57 y.o. [name] Primary Care Provider: [name] Other Clinician: Darral Dash ([id number]) [id number] Page 12 of 12 Referring Provider: Treating Provider/Extender: [name] in Treatment: 35 Diagnosis Coding ICD-10 Codes Code Description [phone] Other chronic osteomyelitis, right ankle and foot E11.621 Type 2 diabetes mellitus with foot ulcer L97.412 Non-pressure chronic ulcer of right heel and midfoot with fat layer exposed I89.0 Lymphedema, not elsewhere classified Facility Procedures : CPT4 Code: [id number] Description: 11042 - DEB SUBQ TISSUE 20 SQ CM/< ICD-10 Diagnosis Description L97.412 Non-pressure chronic ulcer of right heel and midfoot with fat layer exposed E11.621 Type 2 diabetes mellitus with foot ulcer Modifier: Quantity: 1 Physician Procedures : CPT4 Code Description Modifier [id number] 11042 - WC PHYS SUBQ TISS 20 SQ CM ICD-10 Diagnosis Description L97.412 Non-pressure chronic ulcer of right heel and midfoot with fat layer exposed E11.621 Type 2 diabetes mellitus with foot ulcer Quantity: 1 Electronic Signature(s) Signed: 02/12/2023 10:11:48 AM By: Allen Derry PA-C Entered By: Allen Derry on 02/12/2023 10:11:47

## 2023-02-13 ENCOUNTER — Other Ambulatory Visit (HOSPITAL_COMMUNITY): Payer: Self-pay | Admitting: Internal Medicine

## 2023-02-13 ENCOUNTER — Ambulatory Visit (HOSPITAL_COMMUNITY)
Admission: RE | Admit: 2023-02-13 | Discharge: 2023-02-13 | Disposition: A | Discharge status: Home / Self Care | Payer: No Typology Code available for payment source | Source: Ambulatory Visit | Attending: Internal Medicine | Admitting: Internal Medicine

## 2023-02-13 DIAGNOSIS — M869 Osteomyelitis, unspecified: Secondary | ICD-10-CM | POA: Diagnosis present

## 2023-02-13 DIAGNOSIS — L089 Local infection of the skin and subcutaneous tissue, unspecified: Secondary | ICD-10-CM

## 2023-02-13 DIAGNOSIS — I10 Essential (primary) hypertension: Secondary | ICD-10-CM

## 2023-02-13 LAB — CBC WITH DIFFERENTIAL/PLATELET
Abs Immature Granulocytes: 0.06 10*3/uL (ref 0.00–0.07)
Basophils Absolute: 0.1 10*3/uL (ref 0.0–0.1)
Basophils Relative: 1 %
Eosinophils Absolute: 0.3 10*3/uL (ref 0.0–0.5)
Eosinophils Relative: 3 %
HCT: 40.4 % (ref 36.0–46.0)
Hemoglobin: 14 g/dL (ref 12.0–15.0)
Immature Granulocytes: 1 %
Lymphocytes Relative: 12 %
Lymphs Abs: 1.2 10*3/uL (ref 0.7–4.0)
MCH: 27.8 pg (ref 26.0–34.0)
MCHC: 34.7 g/dL (ref 30.0–36.0)
MCV: 80.3 fL (ref 80.0–100.0)
Monocytes Absolute: 0.8 10*3/uL (ref 0.1–1.0)
Monocytes Relative: 9 %
Neutro Abs: 7.1 10*3/uL (ref 1.7–7.7)
Neutrophils Relative %: 74 %
Platelets: 353 10*3/uL (ref 150–400)
RBC: 5.03 MIL/uL (ref 3.87–5.11)
RDW: 13 % (ref 11.5–15.5)
WBC: 9.5 10*3/uL (ref 4.0–10.5)
nRBC: 0 % (ref 0.0–0.2)

## 2023-02-13 LAB — SEDIMENTATION RATE: Sed Rate: 39 mm/hr — ABNORMAL HIGH (ref 0–22)

## 2023-02-13 LAB — C-REACTIVE PROTEIN: CRP: 4.5 mg/dL — ABNORMAL HIGH (ref <1.0)

## 2023-02-13 MED ORDER — LIDOCAINE HCL 1 % IJ SOLN
INTRAMUSCULAR | Status: AC
  Filled 2023-02-13: qty 20

## 2023-02-13 MED ORDER — LIDOCAINE HCL (PF) 1 % IJ SOLN
5.0000 mL | Freq: Once | INTRAMUSCULAR | Status: AC
  Administered 2023-02-13: 5 mL

## 2023-02-13 MED ORDER — HEPARIN SOD (PORK) LOCK FLUSH 100 UNIT/ML IV SOLN
INTRAVENOUS | Status: AC
  Filled 2023-02-13: qty 5

## 2023-02-13 NOTE — Procedures (Signed)
PROCEDURE SUMMARY:  Successful placement of image-guided dual lumen PICC line to the right basilic vein. Length 36 cm. Tip at lower SVC/RA. Both lumens flushed/aspirated, capped, sterile dressing applied. No complications. EBL = < 5 mL. Ready for immediate use.  Please see imaging section of Epic for full dictation.   Villa Herb PA-C 02/13/2023 9:45 AM

## 2023-02-19 ENCOUNTER — Encounter (HOSPITAL_BASED_OUTPATIENT_CLINIC_OR_DEPARTMENT_OTHER): Payer: No Typology Code available for payment source | Admitting: Physician Assistant

## 2023-02-19 DIAGNOSIS — E11621 Type 2 diabetes mellitus with foot ulcer: Secondary | ICD-10-CM | POA: Diagnosis not present

## 2023-02-19 NOTE — Progress Notes (Signed)
Wilbourn, [name] ([id number]) [id number] Page 1 of 1 Visit Report for 02/19/2023 Chief Complaint Document Details Patient Name: Date of Service: [name], [name] 02/19/2023 9:30 A M Medical Record Number: [id number] Patient Account Number: [id number] Date of Birth/Sex: Treating RN: [birth year] (57 y.o. F) Primary Care Provider: [name] Other Clinician: Referring Provider: Treating Provider/Extender: [name] in Treatment: 69 Information Obtained from: Patient Chief Complaint Right heel ulcer Electronic Signature(s) Signed: 02/19/2023 9:53:08 AM By: Allen Derry PA-C Entered By: Allen Derry on 02/19/2023 09:53:08 -------------------------------------------------------------------------------- Problem List Details Patient Name: Date of Service: [name], [name] 02/19/2023 9:30 A M Medical Record Number: [id number] Patient Account Number: [id number] Date of Birth/Sex: Treating RN: [birth year] (57 y.o. F) Primary Care Provider: [name] Other Clinician: Referring Provider: Treating Provider/Extender: [name] in Treatment: 64 Active Problems ICD-10 Encounter Code Description Active Date MDM Diagnosis [phone] Other chronic osteomyelitis, right ankle and foot 08/28/2022 No Yes E11.621 Type 2 diabetes mellitus with foot ulcer 06/12/2022 No Yes L97.412 Non-pressure chronic ulcer of right heel and midfoot with fat 06/12/2022 No Yes layer exposed I89.0 Lymphedema, not elsewhere classified 06/12/2022 No Yes Inactive Problems Resolved Problems Electronic Signature(s) Signed: 02/19/2023 9:52:55 AM By: Allen Derry PA-C Entered By: Allen Derry on 02/19/2023 09:52:54

## 2023-02-26 ENCOUNTER — Ambulatory Visit (HOSPITAL_BASED_OUTPATIENT_CLINIC_OR_DEPARTMENT_OTHER): Payer: No Typology Code available for payment source | Admitting: Physician Assistant

## 2023-03-03 ENCOUNTER — Emergency Department (HOSPITAL_COMMUNITY): Admission: RE | Admit: 2023-03-03 | Payer: No Typology Code available for payment source | Source: Home / Self Care

## 2023-03-03 ENCOUNTER — Other Ambulatory Visit (HOSPITAL_COMMUNITY)
Admission: RE | Admit: 2023-03-03 | Discharge: 2023-03-03 | Disposition: A | Discharge status: Home / Self Care | Payer: No Typology Code available for payment source | Attending: Internal Medicine | Admitting: Internal Medicine

## 2023-03-03 DIAGNOSIS — L89610 Pressure ulcer of right heel, unstageable: Secondary | ICD-10-CM | POA: Insufficient documentation

## 2023-03-03 DIAGNOSIS — N3000 Acute cystitis without hematuria: Secondary | ICD-10-CM | POA: Insufficient documentation

## 2023-03-03 DIAGNOSIS — L03115 Cellulitis of right lower limb: Secondary | ICD-10-CM | POA: Insufficient documentation

## 2023-03-03 LAB — CBC WITH DIFFERENTIAL/PLATELET
Abs Immature Granulocytes: 0.05 10*3/uL (ref 0.00–0.07)
Basophils Absolute: 0.1 10*3/uL (ref 0.0–0.1)
Basophils Relative: 1 %
Eosinophils Absolute: 0.3 10*3/uL (ref 0.0–0.5)
Eosinophils Relative: 4 %
HCT: 42.1 % (ref 36.0–46.0)
Hemoglobin: 13.4 g/dL (ref 12.0–15.0)
Immature Granulocytes: 1 %
Lymphocytes Relative: 14 %
Lymphs Abs: 1.3 10*3/uL (ref 0.7–4.0)
MCH: 27.2 pg (ref 26.0–34.0)
MCHC: 31.8 g/dL (ref 30.0–36.0)
MCV: 85.6 fL (ref 80.0–100.0)
Monocytes Absolute: 1 10*3/uL (ref 0.1–1.0)
Monocytes Relative: 10 %
Neutro Abs: 6.6 10*3/uL (ref 1.7–7.7)
Neutrophils Relative %: 70 %
Platelets: 464 10*3/uL — ABNORMAL HIGH (ref 150–400)
RBC: 4.92 MIL/uL (ref 3.87–5.11)
RDW: 13.1 % (ref 11.5–15.5)
WBC: 9.3 10*3/uL (ref 4.0–10.5)
nRBC: 0 % (ref 0.0–0.2)

## 2023-03-03 LAB — BASIC METABOLIC PANEL
Anion gap: 13 (ref 5–15)
BUN: 12 mg/dL (ref 6–20)
CO2: 28 mmol/L (ref 22–32)
Calcium: 9.2 mg/dL (ref 8.9–10.3)
Chloride: 96 mmol/L — ABNORMAL LOW (ref 98–111)
Creatinine, Ser: 1.02 mg/dL — ABNORMAL HIGH (ref 0.44–1.00)
GFR, Estimated: >60 mL/min (ref >60)
Glucose, Bld: 282 mg/dL — ABNORMAL HIGH (ref 70–99)
Potassium: 3.4 mmol/L — ABNORMAL LOW (ref 3.5–5.1)
Sodium: 137 mmol/L (ref 135–145)

## 2023-03-03 LAB — VANCOMYCIN, TROUGH: Vancomycin Tr: 40 ug/mL (ref 15–20)

## 2023-03-05 ENCOUNTER — Encounter (HOSPITAL_BASED_OUTPATIENT_CLINIC_OR_DEPARTMENT_OTHER): Payer: No Typology Code available for payment source | Admitting: Physician Assistant

## 2023-03-05 DIAGNOSIS — E11621 Type 2 diabetes mellitus with foot ulcer: Secondary | ICD-10-CM | POA: Diagnosis not present

## 2023-03-12 ENCOUNTER — Encounter (HOSPITAL_BASED_OUTPATIENT_CLINIC_OR_DEPARTMENT_OTHER): Payer: No Typology Code available for payment source | Attending: Physician Assistant | Admitting: Physician Assistant

## 2023-03-12 DIAGNOSIS — L97812 Non-pressure chronic ulcer of other part of right lower leg with fat layer exposed: Secondary | ICD-10-CM | POA: Diagnosis not present

## 2023-03-12 DIAGNOSIS — I890 Lymphedema, not elsewhere classified: Secondary | ICD-10-CM | POA: Diagnosis not present

## 2023-03-12 DIAGNOSIS — E11621 Type 2 diabetes mellitus with foot ulcer: Secondary | ICD-10-CM | POA: Insufficient documentation

## 2023-03-12 DIAGNOSIS — M86671 Other chronic osteomyelitis, right ankle and foot: Secondary | ICD-10-CM | POA: Diagnosis not present

## 2023-03-12 DIAGNOSIS — L97412 Non-pressure chronic ulcer of right heel and midfoot with fat layer exposed: Secondary | ICD-10-CM | POA: Diagnosis not present

## 2023-03-12 DIAGNOSIS — E1169 Type 2 diabetes mellitus with other specified complication: Secondary | ICD-10-CM | POA: Insufficient documentation

## 2023-03-19 ENCOUNTER — Encounter (HOSPITAL_BASED_OUTPATIENT_CLINIC_OR_DEPARTMENT_OTHER): Payer: No Typology Code available for payment source | Admitting: Physician Assistant

## 2023-03-19 DIAGNOSIS — E11621 Type 2 diabetes mellitus with foot ulcer: Secondary | ICD-10-CM | POA: Diagnosis not present

## 2023-03-19 NOTE — Progress Notes (Addendum)
[name], [name] ([id number]) [id number] Page 1 of 11 Visit Report for 03/19/2023 Chief Complaint Document Details Patient Name: Date of Service: [name], [name] 03/19/2023 9:30 A M Medical Record Number: [id number] Patient Account Number: [id number] Date of Birth/Sex: Treating RN: [birth year] (57 y.o. F) Primary Care Provider: [name] Other Clinician: Referring Provider: Treating Provider/Extender: [name] in Treatment: 40 Information Obtained from: Patient Chief Complaint Right heel ulcer Electronic Signature(s) Signed: 03/19/2023 9:49:02 AM By: Allen Derry PA-C Entered By: Allen Derry on 03/19/2023 09:49:02 -------------------------------------------------------------------------------- Debridement Details Patient Name: Date of Service: [name], [name] 03/19/2023 9:30 A M Medical Record Number: [id number] Patient Account Number: [id number] Date of Birth/Sex: Treating RN: June 30, 1966 (57 y.o. [name] Primary Care Provider: [name] Other Clinician: Referring Provider: Treating Provider/Extender: [name] in Treatment: 40 Debridement Performed for Assessment: Wound #1 Right Calcaneus Performed By: Physician Lenda Kelp, PA Debridement Type: Debridement Severity of Tissue Pre Debridement: Fat layer exposed Level of Consciousness (Pre-procedure): Awake and Alert Pre-procedure Verification/Time Out Yes - 10:12 Taken: Start Time: 10:12 Pain Control: Lidocaine 4% T opical Solution Percent of Wound Bed Debrided: 100% T Area Debrided (cm): otal 0.06 Tissue and other material debrided: Viable, Non-Viable, Callus, Subcutaneous Level: Skin/Subcutaneous Tissue Debridement Description: Excisional Instrument: Curette Bleeding: Minimum Hemostasis Achieved: Pressure End Time: 10:13 Procedural Pain: 0 Post Procedural Pain: 0 Response to Treatment: Procedure was tolerated well Level of  Consciousness (Post- Awake and Alert procedure): Post Debridement Measurements of Total Wound Length: (cm) 0.4 Width: (cm) 0.2 Depth: (cm) 0.1 Volume: (cm) 0.006 Character of Wound/Ulcer Post Debridement: Improved Severity of Tissue Post Debridement: Fat layer exposed Post Procedure Diagnosis Same as Pre-procedure Notes Scribed for Allen Derry III PA-C, by [facility], Jazmine ([id number]) [id number] Page 2 of 11 Electronic Signature(s) Signed: 03/19/2023 4:47:25 PM By: Allen Derry PA-C Signed: 03/19/2023 5:42:48 PM By: Karie Schwalbe RN Entered By: Karie Schwalbe on 03/19/2023 10:28:56 -------------------------------------------------------------------------------- Debridement Details Patient Name: Date of Service: [name], [name] 03/19/2023 9:30 A M Medical Record Number: [id number] Patient Account Number: [id number] Date of Birth/Sex: Treating RN: 12/22/1965 (57 y.o. [name] Primary Care Provider: [name] Other Clinician: Referring Provider: Treating Provider/Extender: [name] in Treatment: 40 Debridement Performed for Assessment: Wound #4R Right,Distal,Plantar Foot Performed By: Physician Lenda Kelp, PA Debridement Type: Debridement Severity of Tissue Pre Debridement: Fat layer exposed Level of Consciousness (Pre-procedure): Awake and Alert Pre-procedure Verification/Time Out Yes - 10:12 Taken: Start Time: 10:12 Pain Control: Lidocaine 4% Topical Solution Percent of Wound Bed Debrided: 100% T Area Debrided (cm): otal 0.03 Tissue and other material debrided: Non-Viable, Callus Level: Non-Viable Tissue Debridement Description: Selective/Open Wound Instrument: Curette Bleeding: Minimum Hemostasis Achieved: Pressure End Time: 10:13 Procedural Pain: 0 Post Procedural Pain: 0 Response to Treatment: Procedure was tolerated well Level of Consciousness (Post- Awake and Alert procedure): Post  Debridement Measurements of Total Wound Length: (cm) 0.2 Width: (cm) 0.2 Depth: (cm) 0.1 Volume: (cm) 0.003 Character of Wound/Ulcer Post Debridement: Improved Severity of Tissue Post Debridement: Fat layer exposed Post Procedure Diagnosis Same as Pre-procedure Notes Scribed for Allen Derry PA-C III, by [employer]) Signed: 03/19/2023 4:47:25 PM By: Allen Derry PA-C Signed: 03/19/2023 5:42:48 PM By: Karie Schwalbe RN Entered By: Karie Schwalbe on 03/19/2023 10:30:05 -------------------------------------------------------------------------------- HPI Details Patient Name: Date of Service: [name], [name] 03/19/2023 9:30 A M Medical Record Number: [id number] Patient Account Number: [id number] Date of Birth/Sex: Treating RN: [birth year] (57 y.o. F) Primary Care Provider:  [name] Other Clinician: Referring Provider: Treating Provider/Extender: [name] in Treatment: 40 History of Present Illness Chipps, Corda ([id number]) [id number] Page 3 of 11 HPI Description: 06-13-2022 upon evaluation today patient presents for evaluation of her right heel ulcer. She is having a tremendous amount of pain at this location. She tells me that her most recent hemoglobin A1c was 8.7 and that was on 08-23-2021. This ulcer on the heel has been present she states since February 2023 when she had an injection to the heel by podiatry. She states that she began to have increasing pain the wound opened and has been open ever since. 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 She had an x-ray in June if anything can although there did not appear to be any obvious evidence of osteomyelitis at that point. She subsequently has had OR debridement several times of the wound performed by podiatry. Patient does have a history of lymphedema of the lower extremities she is also a type II diabetic. 06-19-2022 upon evaluation  today patient appears to be doing better in regard to the size of her leg which is significantly improved. With that being said she had a lot of drainage from the heel which is completely understandable considering what is going on at this time. There does not appear to be any evidence of active infection there is no warmth or irritation to the leg in general. With that being said the patient does have an issue here with the amount of drainage that is going on I definitely think Zetuvit is good to be the better way to go in place of ABD pads. Also think that she could potentially benefit from 3 times per week dressing changes but again right now we are going to stick with the to change it today, change in her Friday, and then subsequently seeing where things stand next Wednesday. The other option would be to change her to a different day for wound care having her come then say like a Tuesday Friday or Monday Thursday. 06-26-2022 upon evaluation patient's wound bed actually showed signs of doing well in regard to the overall size it was measuring slightly smaller. With that being said unfortunately the biggest issue we see is she still has a tremendous amount of drainage which is what could keep this from being able to use a total contact cast. For that reason I am did going to discuss with her today the possibility of trying Tubigrip to see if that could be of benefit for her. She voiced understanding and is in agreement with giving this a try. 07-03-2022 upon evaluation today patient unfortunately appears to be doing significantly worse at this point in regard to her swelling due to the fact that she was unable to really wear the Tubigrip. She tells me that it was cutting into her. With that being said she also did have an MRI this MRI revealed that she does have osteomyelitis in the heel this was actually just performed Monday, 25 September. This does show signs of "early osteomyelitis". Nonetheless  this is still concerning to me. The wound also appears to be getting deeper in the center aspect of this which has me a little concerned as well. I do believe that she is likely going require an aggressive approach here to try to get things better. I discussed that in greater detail with her today. I will detailed in the plan. 07-17-2022 upon evaluation today patient appears  to be doing poorly in regard to her wound. Unfortunately she does not show any signs of infection systemically though locally this seems to be doing worse with the wound actually being bigger than where it was previous. She did have an MRI that showed evidence of osteomyelitis actually recommended a referral to ID she was evaluated infectious disease and they recommended referral to podiatry and to be honest have recommended amputation based on what the patient tells me today. With that being said this is something that she is not interested in at all to be perfectly honest. For that reason she wants to know what she should do and where she should go at this point that is where the majority of the conversation which was quite lengthy today went. Obviously understand her concern here but I think she is going to have to definitely get off this and likely this means she is going to need to come out of work. 10/18; Since the patient was the patient is using Hydrofera Blue on her right heel wound. She has a new larger open area superior to the wound. The skin on the bottom of her foot is completely macerated. I reviewed the infectious disease note on this patient from 10/9. They recommended keeping the patient on Delofloxacin 450 twice daily until follow-up tomorrow. The patient states she is not going back there as the only thing they wanted to do was "cut my leg off]. She has not seen podiatry. Her lab works shows a CRP markedly elevated at 240.2 a sedimentation rate of 96 the rest of her CBC is normal creatinine of 0.38 The patient  has been to see her primary doctor who is Dr. Leonides Schanz [SP[facility]] In [place]. According the patient Dr. Loralee Pacas has ordered vancomycin and Zosyn to start on November 1. She is not taking the delofloxacin that was suggested by infectious disease. Very angry that they just wanted to consider her for an amputation although I do not specifically see that stated in their note 07-31-2022 upon evaluation today patient still has a significant wound over her plantar aspect of her right l foot region. With that being said I am definitely concerned about the fact that the patient probably does need to be in hyperbaric oxygen therapy at this point. I think we should try to get this going as quickly as possible. She actually is going to be set up with a PICC line next Wednesday and then following this we will have the vancomycin and Zosyn that she will be taking for 8 weeks according to what she tells me. I think this is definitely going to be beneficial and again the goal here is limb salvage. In combination with that I think the hyperbaric oxygen therapy would be ultimately significantly helpful for her. 08-07-2022 upon evaluation today patient appears to still be doing quite poorly overall she still has not gotten her antibiotics. She got her PICC line today which I assumed she will be getting the first dose of antibiotics as well during that time. With that being said unfortunately she did not get the antibiotics and in fact as I questioned her further she does not even know when or if that is going to be started this week. Again I am very concerned about this considering she already has a PICC line in place we do not want this to clot off on top of the fact that she should already be on these antibiotics she just never went to the ER for  evaluation therefore they never got this started. At this point based on what I am seeing I really think that the ideal thing would be for [place] to see about getting her to the ER for further  evaluation and treatment to see if they can get this started for her as soon as possible. The patient voiced understanding and is in agreement with the plan. I gave her recommendations for what should be done also called [facility] infusion therapy in order to see if they would be the ones that seem to be available for her IV infusions but they tell me that they no longer do this that is apparently where the orders were sent to for her infusion therapy. That was by her primary care provider. 08-21-2022 upon evaluation today patient's wound on the bottom of the heel and foot area as well as the toe appear to be doing significantly better. Fortunately I do not see any evidence of infection locally or systemically and everything is measuring much smaller than where we have been. The wound in the left gluteal region also is dramatically improved compared to last time she was here. She has started the antibiotics this is cefepime and vancomycin. She did have a somewhat high trough and therefore they have her hold that today and they are to recheck in the morning I believe she told me. Nonetheless with the antibiotics going she seems to be doing significantly better which is great news. 08-28-2022 upon evaluation today patient appears to be doing somewhat better in regard to her foot there is definitely less drainage than what we have seen in the past. I do believe the antibiotics are helping. With that being said she is also been approved for hyperbaric oxygen therapy and I think as soon as we get this started the better. I discussed that with her today as well. With that being said I do think that the last thing she needs is her chest x-ray when she has that done will be ready to start her into treatment. She is going to try to go get that today when she leaves which I think would be ideal. 09-11-2022 upon evaluation today patient appears to be doing well with regard to her foot ulcer. This is showing signs of no  digression and she does not appear to be having as much drainage as before though it still very wet is not nearly the extreme of what it was previous. Fortunately there does not appear to be any signs of active infection locally nor systemically at this point which is great news. Patient did have her chest x-ray as well as EKG and she is actually approved and completely cleared for HBO therapy from both a clinical standpoint as well as an insurance standpoint. 09-25-2022 upon evaluation today patient appears to be doing well in regard to her foot ulcer. Unfortunately she is not doing as well when it comes to her hyperbaric treatments. She is having a lot of trouble with claustrophobia. She tells me in general that she is not sure she is good to be able to continue. 10-09-2022 upon evaluation today patient's wounds actually are showing signs of excellent improvement which is great news. Fortunately I do not see any evidence of infection locally or systemically which is great news and overall I am extremely pleased with where things are currently. I do think that she is making good progress she does tell me at this point today she does not want to go forward with  a hyperbaric oxygen therapy. 10-16-2022 upon evaluation today patient appears to be doing excellent in regard to her foot ulcer. This is actually showing signs of excellent improvement. She still does not allow for any sharp debridement but the good news is this is doing much better and does not even need it at the moment. Nonetheless as far as sharp debridement is concerned this has been too painful for really not been able to do that all along. 10-23-2022 upon evaluation today patient actually is making excellent progress. I am extremely pleased with where we stand I have considered even seeing Pandolfi, Bhavana ([id number]) [phone].pdf Page 4 of 11 about putting her in the total contact cast but to be honest she is doing so  well currently and still with having some drainage I feel like it is better for her to be able to change it then to be locked up in the cast for a week at a time. She has voiced understanding as well. Overall though I feel like you are making some really good progress here. 10-30-2022 upon evaluation today patient appears to be doing excellent in regard to her foot ulcers. Both are showing signs of improvement which is great news and overall I feel like that they are measuring smaller, looking better, and I see no signs of resurgence of the infection which is great news. Overall I am extremely pleased at this point. 2/7; the patient has 2 wounds which are remanence of the large wound on the right foot. This was a diabetic wound with underlying osteomyelitis she has completed antibiotics. Today she has the open area on the distal calcaneus and an area on the right midfoot which is eschared. She has been using silver alginate and offloading this in a regular running shoe. 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 11-27-2022 upon evaluation today patient appears to be doing well currently in regard to her wound which is actually showing signs of significant improvement this is great news. Fortunately there does not appear to be any signs of active infection locally nor systemically which is also excellent. No fevers, chills, nausea, vomiting, or diarrhea. 12-25-2022 upon evaluation today patient appears to be doing well currently in regard to the distal portion of her foot which is actually in my opinion looking a little better but have a lot of callus she is going require some debridement here. With that being said the heel actually looks like it might be a little bit worse. There is a lot of callus I cannot tell exactly what is open or not we can remove that and we will see what exactly were dealing with there. With that being said she  also has an area on the right side of her leg which appears to be more of a pressure injury she tells me this is from sitting in her recliner she has gotten a pillow to help take care of this. Fortunately it is not technically open and draining at this point made it very well may be before it is also not done. 01-01-2023 upon evaluation today patient appears to be doing well currently in regard to her wounds I feel like the cefdinir has helped she seems to be improved compared to last week's evaluation. I do not see any signs of active infection locally nor systemically at this time which is great news. No fevers, chills, nausea, vomiting, or diarrhea. 01-08-2023 upon evaluation today  patient's wounds on the foot appear to be doing decently well the leg is still about the same. With that being said unfortunately she does have a lot of erythema on the leg in particular which is unfortunate and definitely not what we are looking for. Again I do not think that the [facility] has really done well for her in that regard. 01-15-2023 upon evaluation today patient appears to be doing well currently in regard to her wound. Overall I think that things are measuring a little bit better but unfortunately the erythema is spreading. I did review her culture today. Unfortunately it does show that she does have MRSA noted and again I think that we need to do something to try to get this under control as quickly as possible. She has previously been on IV antibiotics and unfortunately that has not been sufficient to get this completely cleared it seems to have come back at this point and she really wants to avoid going back on IV antibiotics. We have been using oral antibiotics and various forms the most recent was actually a course of cefdinir. She is also previously been on clindamycin and even in the past she has had to doxycycline. With that being said with all things considered I think we are still having a lot of trouble  here keeping this infection under control. We previously attempted hyperbarics but the patient did not tolerate this well. She therefore discontinued HBO therapy. 01-22-2023 upon evaluation today patient appears to be doing a little better in regard to the cellulitis on her right leg. I am much more pleased with what I am seeing today. With that being said unfortunately she is still having quite a bit of issues here with erythema though not as bad I think that were not completely clear. Unfortunately we were denied for the [facility] prescription. She has the medication but I provided for her by way of samples but nothing further. 01-29-2023 upon evaluation today patient appears to be doing poorly currently in regard to her leg. I do not feel like she is showing as much improvement as she was within desirable now that we switch her to linezolid. With that being said I do believe that we probably need to see about IV antibiotics I recommended a referral back to ID. With that being said she would prefer to see her physician which I am okay with but either way I think she needs to be back on IV antibiotics for a minimum of 2 months as soon as possible. I am going to send her for lab work today in order to get some baseline labs but honestly I think that we are going to require IV antibiotics to get this under control. 02-12-2023 upon evaluation today patient appears to be doing well currently in regard to her wound all things considered. She has not gotten the lab work done yet that we ordered a couple weeks back. She tells me she has had a lot going on with the death of the family. Fortunately there does not appear to be any signs of worsening in fact some of the heel looks a little bit better which is great news. Fortunately I do not see any evidence of active infection locally nor systemically which is great news. 02-19-24 upon evaluation today patient appears to be doing well currently in regard to her wounds.  She has been tolerating the dressing changes without complication. Fortunately there does not appear to be any signs of active infection  locally nor systemically which is great news. No fevers, chills, nausea, vomiting, or diarrhea. She is currently on vancomycin and Zosyn through IV which is doing I think well she has been on it for not quite a week at this point. 03-05-2023 upon evaluation today patient appears to be doing well currently in regard to her wound. She has been tolerating the dressing changes all 3 locations actually are significantly smaller even compared to last week I am very pleased with where we stand. I think the antibiotics are really doing a good job here for her. 03-12-2023 upon evaluation today patient has actually continued to make excellent progress in regard to her wounds. I am very pleased with where we stand in fact I think her leg is healed the middle part of her foot I think is close to being closed and the heel does look better though there is some callus that needs to be removed at this point. I did discuss that with her today. 03-19-2023 upon evaluation today patient's wounds are actually showing signs of excellent improvement. I am actually very pleased with where we stand and I think that she is making really good progress here. I do not see any signs of active infection locally nor systemically which is great news. Electronic Signature(s) Signed: 03/19/2023 3:01:05 PM By: Allen Derry PA-C Entered By: Allen Derry on 03/19/2023 15:01:05 -------------------------------------------------------------------------------- Physical Exam Details Patient Name: Date of Service: SHAWNI, LACHMAN 03/19/2023 9:30 A M Medical Record Number: [id number] Patient Account Number: [id number] Date of Birth/Sex: Treating RN: [birth year] (56 y.o. F) Primary Care Provider: [name] Other Clinician: Referring Provider: Treating Provider/Extender: [name] in  Treatment: [address] [place] ([id number]) [id number] Page 5 of 11 Well-nourished and well-hydrated in no acute distress. Respiratory normal breathing without difficulty. Psychiatric this patient is able to make decisions and demonstrates good insight into disease process. Alert and Oriented x 3. pleasant and cooperative. Notes Upon inspection patient's wounds did require sharp debridement clearway some necrotic debris and callus she tolerated this both in the middle of the foot as well as the heel without complication postdebridement this is much improved. Electronic Signature(s) Signed: 03/19/2023 3:03:38 PM By: Allen Derry PA-C Entered By: Allen Derry on 03/19/2023 15:03:38 -------------------------------------------------------------------------------- Physician Orders Details Patient Name: Date of Service: NAELY, NIEDER 03/19/2023 9:30 A M Medical Record Number: [id number] Patient Account Number: [id number] Date of Birth/Sex: Treating RN: Nov 26, 1965 (57 y.o. [name] Primary Care Provider: [name] Other Clinician: Referring Provider: Treating Provider/Extender: [name] in Treatment: 43 Verbal / Phone Orders: No Diagnosis Coding ICD-10 Coding Code Description [phone] Other chronic osteomyelitis, right ankle and foot E11.621 Type 2 diabetes mellitus with foot ulcer L97.412 Non-pressure chronic ulcer of right heel and midfoot with fat layer exposed I89.0 Lymphedema, not elsewhere classified L97.812 Non-pressure chronic ulcer of other part of right lower leg with fat layer exposed Follow-up Appointments ppointment in 1 week. Allen Derry III PA-C Room 9 Wednesday 03/26/23 at 9:30am Return A Other: - PICC line 02/13/2023 with [facility]. Anesthetic (In clinic) Topical Lidocaine 5% applied to wound bed Bathing/ Shower/ Hygiene May shower with protection but do not get wound dressing(s) wet. Protect  dressing(s) with water repellant cover (for example, large plastic bag) or a cast cover and may then take shower. Edema Control - Lymphedema / SCD / Other Elevate legs to the level of the heart or above for 30 minutes daily and/or when sitting for 3-4 times  a day throughout the day. Avoid standing for long periods of time. Off-Loading Other: - Minimize walking and standing as much as possible to right foot to aid in offloading to wound. While at work use the wheelchair for mobility. felt in shoe to aid in offloading wound and heel. Wound Treatment Wound #1 - Calcaneus Wound Laterality: Right Cleanser: Soap and Water 1 x Per Day/30 Days Discharge Instructions: May shower and wash wound with dial antibacterial soap and water prior to dressing change. Cleanser: Wound Cleanser (Generic) 1 x Per Day/30 Days Discharge Instructions: Cleanse the wound with wound cleanser prior to applying a clean dressing using gauze sponges, not tissue or cotton balls. Peri-Wound Care: Sween Lotion (Moisturizing lotion) 1 x Per Day/30 Days Discharge Instructions: Apply moisturizing lotion as directed Prim Dressing: Sorbalgon AG Dressing 2x2 (in/in) (Generic) 1 x Per Day/30 Days ary Discharge Instructions: Apply to wound bed and cut to size of wound Secondary Dressing: ABD Pad, 5x9 (Generic) 1 x Per Day/30 Days Discharge Instructions: Apply over primary dressing as directed. Nudd, [name] ([id number]) [id number] Page 6 of 11 Secondary Dressing: Woven Gauze Sponge, Non-Sterile 4x4 in (Generic) 1 x Per Day/30 Days Discharge Instructions: Apply over primary dressing as directed. Secondary Dressing: heel ortho felt 1 x Per Day/30 Days Discharge Instructions: apply in shoe to aid in offloading of heel and wound. Secured With: Web designer, Sterile 4x75 (in/in) (Generic) 1 x Per Day/30 Days Discharge Instructions: Secure with stretch gauze as directed. Secured With:  65M Medipore H Soft Cloth Surgical T ape, 4 x 10 (in/yd) (Generic) 1 x Per Day/30 Days Discharge Instructions: Secure with tape as directed. Wound #4R - Foot Wound Laterality: Plantar, Right, Distal Cleanser: Soap and Water 1 x Per Day/30 Days Discharge Instructions: May shower and wash wound with dial antibacterial soap and water prior to dressing change. Cleanser: Wound Cleanser (Generic) 1 x Per Day/30 Days Discharge Instructions: Cleanse the wound with wound cleanser prior to applying a clean dressing using gauze sponges, not tissue or cotton balls. Peri-Wound Care: Sween Lotion (Moisturizing lotion) 1 x Per Day/30 Days Discharge Instructions: Apply moisturizing lotion as directed Prim Dressing: Sorbalgon AG Dressing 2x2 (in/in) (Generic) 1 x Per Day/30 Days ary Discharge Instructions: Apply to wound bed and cut to size of the wound Secondary Dressing: ABD Pad, 5x9 (Generic) 1 x Per Day/30 Days Discharge Instructions: Apply over primary dressing as directed. Secondary Dressing: Woven Gauze Sponge, Non-Sterile 4x4 in (Generic) 1 x Per Day/30 Days Discharge Instructions: Apply over primary dressing as directed. Secondary Dressing: heel ortho felt 1 x Per Day/30 Days Discharge Instructions: apply in shoe to aid in offloading of heel and wound. Secured With: Web designer, Sterile 4x75 (in/in) (Generic) 1 x Per Day/30 Days Discharge Instructions: Secure with stretch gauze as directed. Secured With: 65M Medipore H Soft Cloth Surgical T ape, 4 x 10 (in/yd) (Generic) 1 x Per Day/30 Days Discharge Instructions: Secure with tape as directed. Electronic Signature(s) Signed: 03/19/2023 4:47:25 PM By: Allen Derry PA-C Signed: 03/19/2023 5:42:48 PM By: Karie Schwalbe RN Entered By: Karie Schwalbe on 03/19/2023 10:28:31 -------------------------------------------------------------------------------- Problem List Details Patient Name: Date of Service: FRANKIE, LUMBRA 03/19/2023  9:30 A M Medical Record Number: [id number] Patient Account Number: [id number] Date of Birth/Sex: Treating RN: 12-12-1965 (57 y.o. F) Primary Care Provider: [name] Other Clinician: Referring Provider: Treating Provider/Extender: [name] in Treatment: 73 Active Problems ICD-10 Encounter Code Description Active Date MDM Diagnosis [phone] Other  chronic osteomyelitis, right ankle and foot 08/28/2022 No Yes E11.621 Type 2 diabetes mellitus with foot ulcer 06/12/2022 No Yes L97.412 Non-pressure chronic ulcer of right heel and midfoot with fat layer exposed 06/12/2022 No Yes Stickle, Avrianna ([id number]) [id number] Page 7 of 11 I89.0 Lymphedema, not elsewhere classified 06/12/2022 No Yes L97.812 Non-pressure chronic ulcer of other part of right lower leg with fat layer 03/05/2023 No Yes exposed Inactive Problems Resolved Problems Electronic Signature(s) Signed: 03/19/2023 9:48:46 AM By: Allen Derry PA-C Entered By: Allen Derry on 03/19/2023 09:48:45 -------------------------------------------------------------------------------- Progress Note Details Patient Name: Date of Service: MIRI, TURNIER 03/19/2023 9:30 A M Medical Record Number: [id number] Patient Account Number: [id number] Date of Birth/Sex: Treating RN: May 28, 1966 (57 y.o. F) Primary Care Provider: [name] Other Clinician: Referring Provider: Treating Provider/Extender: [name] in Treatment: 40 Subjective Chief Complaint Information obtained from Patient Right heel ulcer History of Present Illness (HPI) 06-13-2022 upon evaluation today patient presents for evaluation of her right heel ulcer. She is having a tremendous amount of pain at this location. She tells me that her most recent hemoglobin A1c was 8.7 and that was on 08-23-2021. This ulcer on the heel has been present she states since February 2023 when she had an injection to the heel by  podiatry. She states that she began to have increasing pain the wound opened and has been open ever since. 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 She had an x-ray in June if anything can although there did not appear to be any obvious evidence of osteomyelitis at that point. She subsequently has had OR debridement several times of the wound performed by podiatry. Patient does have a history of lymphedema of the lower extremities she is also a type II diabetic. 06-19-2022 upon evaluation today patient appears to be doing better in regard to the size of her leg which is significantly improved. With that being said she had a lot of drainage from the heel which is completely understandable considering what is going on at this time. There does not appear to be any evidence of active infection there is no warmth or irritation to the leg in general. With that being said the patient does have an issue here with the amount of drainage that is going on I definitely think Zetuvit is good to be the better way to go in place of ABD pads. Also think that she could potentially benefit from 3 times per week dressing changes but again right now we are going to stick with the to change it today, change in her Friday, and then subsequently seeing where things stand next Wednesday. The other option would be to change her to a different day for wound care having her come then say like a Tuesday Friday or Monday Thursday. 06-26-2022 upon evaluation patient's wound bed actually showed signs of doing well in regard to the overall size it was measuring slightly smaller. With that being said unfortunately the biggest issue we see is she still has a tremendous amount of drainage which is what could keep this from being able to use a total contact cast. For that reason I am did going to discuss with her today the possibility of trying Tubigrip to see if that could be of benefit for her. She  voiced understanding and is in agreement with giving this a try. 07-03-2022 upon evaluation today patient unfortunately appears to be doing significantly worse at this point in regard to her swelling  due to the fact that she was unable to really wear the Tubigrip. She tells me that it was cutting into her. With that being said she also did have an MRI this MRI revealed that she does have osteomyelitis in the heel this was actually just performed Monday, 25 September. This does show signs of "early osteomyelitis". Nonetheless this is still concerning to me. The wound also appears to be getting deeper in the center aspect of this which has me a little concerned as well. I do believe that she is likely going require an aggressive approach here to try to get things better. I discussed that in greater detail with her today. I will detailed in the plan. 07-17-2022 upon evaluation today patient appears to be doing poorly in regard to her wound. Unfortunately she does not show any signs of infection systemically though locally this seems to be doing worse with the wound actually being bigger than where it was previous. She did have an MRI that showed evidence of osteomyelitis actually recommended a referral to ID she was evaluated infectious disease and they recommended referral to podiatry and to be honest have recommended amputation based on what the patient tells me today. With that being said this is something that she is not interested in at all to be perfectly honest. For that reason she wants to know what she should do and where she should go at this point that is where the majority of the conversation which was quite lengthy today went. Obviously understand her concern here but I think she is going to have to definitely get off this and likely this means she is going to need to come out of work. 10/18; Since the patient was the patient is using Hydrofera Blue on her right heel wound. She has a new  larger open area superior to the wound. The skin on the bottom of her foot is completely macerated. I reviewed the infectious disease note on this patient from 10/9. They recommended keeping the patient on Delofloxacin 450 twice daily until follow-up tomorrow. The patient states she is not going back there as the only thing they wanted to do was "cut my leg off]. She has not seen podiatry. Her lab works shows a CRP markedly elevated at 240.2 a sedimentation rate of 96 the rest of her CBC is normal creatinine of 0.38 Radigan, Paizlie ([id number]) [phone].pdf Page 8 of 11 The patient has been to see her primary doctor who is Dr. Leonides Schanz [SP[facility]] In [place]. According the patient Dr. Loralee Pacas has ordered vancomycin and Zosyn to start on November 1. She is not taking the delofloxacin that was suggested by infectious disease. Very angry that they just wanted to consider her for an amputation although I do not specifically see that stated in their note 07-31-2022 upon evaluation today patient still has a significant wound over her plantar aspect of her right l foot region. With that being said I am definitely concerned about the fact that the patient probably does need to be in hyperbaric oxygen therapy at this point. I think we should try to get this going as quickly as possible. She actually is going to be set up with a PICC line next Wednesday and then following this we will have the vancomycin and Zosyn that she will be taking for 8 weeks according to what she tells me. I think this is definitely going to be beneficial and again the goal here is limb salvage. In combination with that I  think the hyperbaric oxygen therapy would be ultimately significantly helpful for her. 08-07-2022 upon evaluation today patient appears to still be doing quite poorly overall she still has not gotten her antibiotics. She got her PICC line today which I assumed she will be getting the first dose of antibiotics  as well during that time. With that being said unfortunately she did not get the antibiotics and in fact as I questioned her further she does not even know when or if that is going to be started this week. Again I am very concerned about this considering she already has a PICC line in place we do not want this to clot off on top of the fact that she should already be on these antibiotics she just never went to the ER for evaluation therefore they never got this started. At this point based on what I am seeing I really think that the ideal thing would be for [place] to see about getting her to the ER for further evaluation and treatment to see if they can get this started for her as soon as possible. The patient voiced understanding and is in agreement with the plan. I gave her recommendations for what should be done also called [facility] infusion therapy in order to see if they would be the ones that seem to be available for her IV infusions but they tell me that they no longer do this that is apparently where the orders were sent to for her infusion therapy. That was by her primary care provider. 08-21-2022 upon evaluation today patient's wound on the bottom of the heel and foot area as well as the toe appear to be doing significantly better. Fortunately I do not see any evidence of infection locally or systemically and everything is measuring much smaller than where we have been. The wound in the left gluteal region also is dramatically improved compared to last time she was here. She has started the antibiotics this is cefepime and vancomycin. She did have a somewhat high trough and therefore they have her hold that today and they are to recheck in the morning I believe she told me. Nonetheless with the antibiotics going she seems to be doing significantly better which is great news. 08-28-2022 upon evaluation today patient appears to be doing somewhat better in regard to her foot there is definitely less  drainage than what we have seen in the past. I do believe the antibiotics are helping. With that being said she is also been approved for hyperbaric oxygen therapy and I think as soon as we get this started the better. I discussed that with her today as well. With that being said I do think that the last thing she needs is her chest x-ray when she has that done will be ready to start her into treatment. She is going to try to go get that today when she leaves which I think would be ideal. 09-11-2022 upon evaluation today patient appears to be doing well with regard to her foot ulcer. This is showing signs of no digression and she does not appear to be having as much drainage as before though it still very wet is not nearly the extreme of what it was previous. Fortunately there does not appear to be any signs of active infection locally nor systemically at this point which is great news. Patient did have her chest x-ray as well as EKG and she is actually approved and completely cleared for HBO therapy from both  a clinical standpoint as well as an [employer]ista. 09-25-2022 upon evaluation today patient appears to be doing well in regard to her foot ulcer. Unfortunately she is not doing as well when it comes to her hyperbaric treatments. She is having a lot of trouble with claustrophobia. She tells me in general that she is not sure she is good to be able to continue. 10-09-2022 upon evaluation today patient's wounds actually are showing signs of excellent improvement which is great news. Fortunately I do not see any evidence of infection locally or systemically which is great news and overall I am extremely pleased with where things are currently. I do think that she is making good progress she does tell me at this point today she does not want to go forward with a hyperbaric oxygen therapy. 10-16-2022 upon evaluation today patient appears to be doing excellent in regard to her foot ulcer. This is  actually showing signs of excellent improvement. She still does not allow for any sharp debridement but the good news is this is doing much better and does not even need it at the moment. Nonetheless as far as sharp debridement is concerned this has been too painful for really not been able to do that all along. 10-23-2022 upon evaluation today patient actually is making excellent progress. I am extremely pleased with where we stand I have considered even seeing about putting her in the total contact cast but to be honest she is doing so well currently and still with having some drainage I feel like it is better for her to be able to change it then to be locked up in the cast for a week at a time. She has voiced understanding as well. Overall though I feel like you are making some really good progress here. 10-30-2022 upon evaluation today patient appears to be doing excellent in regard to her foot ulcers. Both are showing signs of improvement which is great news and overall I feel like that they are measuring smaller, looking better, and I see no signs of resurgence of the infection which is great news. Overall I am extremely pleased at this point. 2/7; the patient has 2 wounds which are remanence of the large wound on the right foot. This was a diabetic wound with underlying osteomyelitis she has completed antibiotics. Today she has the open area on the distal calcaneus and an area on the right midfoot which is eschared. She has been using silver alginate and offloading this in a regular running shoe. 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 11-27-2022 upon evaluation today patient appears to be doing well currently in regard to her wound which is actually showing signs of significant improvement this is great news. Fortunately there does not appear to be any signs of active infection locally nor systemically which is also  excellent. No fevers, chills, nausea, vomiting, or diarrhea. 12-25-2022 upon evaluation today patient appears to be doing well currently in regard to the distal portion of her foot which is actually in my opinion looking a little better but have a lot of callus she is going require some debridement here. With that being said the heel actually looks like it might be a little bit worse. There is a lot of callus I cannot tell exactly what is open or not we can remove that and we will see what exactly were dealing with there. With that being said she  also has an area on the right side of her leg which appears to be more of a pressure injury she tells me this is from sitting in her recliner she has gotten a pillow to help take care of this. Fortunately it is not technically open and draining at this point made it very well may be before it is also not done. 01-01-2023 upon evaluation today patient appears to be doing well currently in regard to her wounds I feel like the cefdinir has helped she seems to be improved compared to last week's evaluation. I do not see any signs of active infection locally nor systemically at this time which is great news. No fevers, chills, nausea, vomiting, or diarrhea. 01-08-2023 upon evaluation today patient's wounds on the foot appear to be doing decently well the leg is still about the same. With that being said unfortunately she does have a lot of erythema on the leg in particular which is unfortunate and definitely not what we are looking for. Again I do not think that the [facility] has really done well for her in that regard. 01-15-2023 upon evaluation today patient appears to be doing well currently in regard to her wound. Overall I think that things are measuring a little bit better but unfortunately the erythema is spreading. I did review her culture today. Unfortunately it does show that she does have MRSA noted and again I think that we need to do something to try to get  this under control as quickly as possible. She has previously been on IV antibiotics and unfortunately that has not been sufficient to get this completely cleared it seems to have come back at this point and she really wants to avoid going back on IV antibiotics. We have been using oral antibiotics and various forms the most recent was actually a course of cefdinir. She is also previously been on clindamycin and even in the past she has had to doxycycline. With that being said with all things considered I think we are still having a lot of trouble here keeping this infection under control. We previously attempted hyperbarics but the patient did not tolerate this well. She therefore discontinued HBO therapy. 01-22-2023 upon evaluation today patient appears to be doing a little better in regard to the cellulitis on her right leg. I am much more pleased with what I am seeing today. With that being said unfortunately she is still having quite a bit of issues here with erythema though not as bad I think that were not completely clear. Unfortunately we were denied for the [facility] prescription. She has the medication but I provided for her by way of samples but nothing further. Yanko, [name] ([id number]) [id number] Page 9 of 11 01-29-2023 upon evaluation today patient appears to be doing poorly currently in regard to her leg. I do not feel like she is showing as much improvement as she was within desirable now that we switch her to linezolid. With that being said I do believe that we probably need to see about IV antibiotics I recommended a referral back to ID. With that being said she would prefer to see her physician which I am okay with but either way I think she needs to be back on IV antibiotics for a minimum of 2 months as soon as possible. I am going to send her for lab work today in order to get some baseline labs but honestly I think that we are going to require IV antibiotics  to  get this under control. 02-12-2023 upon evaluation today patient appears to be doing well currently in regard to her wound all things considered. She has not gotten the lab work done yet that we ordered a couple weeks back. She tells me she has had a lot going on with the death of the family. Fortunately there does not appear to be any signs of worsening in fact some of the heel looks a little bit better which is great news. Fortunately I do not see any evidence of active infection locally nor systemically which is great news. 02-19-24 upon evaluation today patient appears to be doing well currently in regard to her wounds. She has been tolerating the dressing changes without complication. Fortunately there does not appear to be any signs of active infection locally nor systemically which is great news. No fevers, chills, nausea, vomiting, or diarrhea. She is currently on vancomycin and Zosyn through IV which is doing I think well she has been on it for not quite a week at this point. 03-05-2023 upon evaluation today patient appears to be doing well currently in regard to her wound. She has been tolerating the dressing changes all 3 locations actually are significantly smaller even compared to last week I am very pleased with where we stand. I think the antibiotics are really doing a good job here for her. 03-12-2023 upon evaluation today patient has actually continued to make excellent progress in regard to her wounds. I am very pleased with where we stand in fact I think her leg is healed the middle part of her foot I think is close to being closed and the heel does look better though there is some callus that needs to be removed at this point. I did discuss that with her today. 03-19-2023 upon evaluation today patient's wounds are actually showing signs of excellent improvement. I am actually very pleased with where we stand and I think that she is making really good progress here. I do not see any  signs of active infection locally nor systemically which is great news. Objective Constitutional Well-nourished and well-hydrated in no acute distress. Vitals Time Taken: 10:01 AM, Height: 65 in, Weight: 331 lbs, BMI: 55.1, Temperature: 98.3 F, Pulse: 83 bpm, Respiratory Rate: 18 breaths/min, Blood Pressure: 108/75 mmHg. Respiratory normal breathing without difficulty. Psychiatric this patient is able to make decisions and demonstrates good insight into disease process. Alert and Oriented x 3. pleasant and cooperative. General Notes: Upon inspection patient's wounds did require sharp debridement clearway some necrotic debris and callus she tolerated this both in the middle of the foot as well as the heel without complication postdebridement this is much improved. Integumentary (Hair, Skin) Wound #1 status is Open. Original cause of wound was Gradually Appeared. The date acquired was: 11/07/2020. The wound has been in treatment 40 weeks. The wound is located on the Right Calcaneus. The wound measures 0.4cm length x 0.2cm width x 0.1cm depth; 0.063cm^2 area and 0.006cm^3 volume. There is Fat Layer (Subcutaneous Tissue) exposed. There is no tunneling or undermining noted. There is a medium amount of serosanguineous drainage noted. The wound margin is thickened. There is large (67-100%) red, pink granulation within the wound bed. There is a small (1-33%) amount of necrotic tissue within the wound bed including Eschar and Adherent Slough. The periwound skin appearance exhibited: Callus, Maceration. The periwound skin appearance did not exhibit: Crepitus, Excoriation, Induration, Rash, Scarring, Dry/Scaly, Atrophie Blanche, Cyanosis, Ecchymosis, Hemosiderin Staining, Mottled, Pallor, Rubor, Erythema. Periwound temperature was noted  as No Abnormality. The periwound has tenderness on palpation. Wound #4R status is Open. Original cause of wound was Blister. The date acquired was: 11/08/2020. The wound has  been in treatment 22 weeks. The wound is located on the Right,Distal,Plantar Foot. The wound measures 0.2cm length x 0.2cm width x 0.1cm depth; 0.031cm^2 area and 0.003cm^3 volume. There is Fat Layer (Subcutaneous Tissue) exposed. There is no tunneling or undermining noted. There is a medium amount of serosanguineous drainage noted. The wound margin is distinct with the outline attached to the wound base. There is no granulation within the wound bed. There is a large (67-100%) amount of necrotic tissue within the wound bed including Eschar and Adherent Slough. The periwound skin appearance exhibited: Callus. The periwound skin appearance did not exhibit: Crepitus, Excoriation, Induration, Rash, Scarring, Dry/Scaly, Maceration, Atrophie Blanche, Cyanosis, Ecchymosis, Hemosiderin Staining, Mottled, Pallor, Rubor, Erythema. Assessment Active Problems ICD-10 Other chronic osteomyelitis, right ankle and foot Type 2 diabetes mellitus with foot ulcer Non-pressure chronic ulcer of right heel and midfoot with fat layer exposed Lymphedema, not elsewhere classified Non-pressure chronic ulcer of other part of right lower leg with fat layer exposed Raigoza, Nirali ([id number]) [id number] Page 10 of 11 Procedures Wound #1 Pre-procedure diagnosis of Wound #1 is a Diabetic Wound/Ulcer of the Lower Extremity located on the Right Calcaneus .Severity of Tissue Pre Debridement is: Fat layer exposed. There was a Excisional Skin/Subcutaneous Tissue Debridement with a total area of 0.06 sq cm performed by Lenda Kelp, PA. With the following instrument(s): Curette to remove Viable and Non-Viable tissue/material. Material removed includes Callus and Subcutaneous Tissue and after achieving pain control using Lidocaine 4% T opical Solution. No specimens were taken. A time out was conducted at 10:12, prior to the start of the procedure. A Minimum amount of bleeding was controlled with  Pressure. The procedure was tolerated well with a pain level of 0 throughout and a pain level of 0 following the procedure. Post Debridement Measurements: 0.4cm length x 0.2cm width x 0.1cm depth; 0.006cm^3 volume. Character of Wound/Ulcer Post Debridement is improved. Severity of Tissue Post Debridement is: Fat layer exposed. Post procedure Diagnosis Wound #1: Same as Pre-Procedure General Notes: Scribed for [employer] III PA-C, by J.Scotton. Wound #4R Pre-procedure diagnosis of Wound #4R is a Diabetic Wound/Ulcer of the Lower Extremity located on the Right,Distal,Plantar Foot .Severity of Tissue Pre Debridement is: Fat layer exposed. There was a Selective/Open Wound Non-Viable Tissue Debridement with a total area of 0.03 sq cm performed by Lenda Kelp, PA. With the following instrument(s): Curette to remove Non-Viable tissue/material. Material removed includes Callus after achieving pain control using Lidocaine 4% Topical Solution. No specimens were taken. A time out was conducted at 10:12, prior to the start of the procedure. A Minimum amount of bleeding was controlled with Pressure. The procedure was tolerated well with a pain level of 0 throughout and a pain level of 0 following the procedure. Post Debridement Measurements: 0.2cm length x 0.2cm width x 0.1cm depth; 0.003cm^3 volume. Character of Wound/Ulcer Post Debridement is improved. Severity of Tissue Post Debridement is: Fat layer exposed. Post procedure Diagnosis Wound #4R: Same as Pre-Procedure General Notes: Scribed for [employer] PA-C III, by J.Scotton. Plan Follow-up Appointments: Return Appointment in 1 week. Allen Derry III PA-C Room 9 Wednesday 03/26/23 at 9:30am Other: - PICC line 02/13/2023 with [facility]. Anesthetic: (In clinic) Topical Lidocaine 5% applied to wound bed Bathing/ Shower/ Hygiene: May shower with protection but do not get wound dressing(s) wet.  Protect dressing(s) with water repellant cover (for example, large  plastic bag) or a cast cover and may then take shower. Edema Control - Lymphedema / SCD / Other: Elevate legs to the level of the heart or above for 30 minutes daily and/or when sitting for 3-4 times a day throughout the day. Avoid standing for long periods of time. Off-Loading: Other: - Minimize walking and standing as much as possible to right foot to aid in offloading to wound. While at work use the wheelchair for mobility. felt in shoe to aid in offloading wound and heel. WOUND #1: - Calcaneus Wound Laterality: Right Cleanser: Soap and Water 1 x Per Day/30 Days Discharge Instructions: May shower and wash wound with dial antibacterial soap and water prior to dressing change. Cleanser: Wound Cleanser (Generic) 1 x Per Day/30 Days Discharge Instructions: Cleanse the wound with wound cleanser prior to applying a clean dressing using gauze sponges, not tissue or cotton balls. Peri-Wound Care: Sween Lotion (Moisturizing lotion) 1 x Per Day/30 Days Discharge Instructions: Apply moisturizing lotion as directed Prim Dressing: Sorbalgon AG Dressing 2x2 (in/in) (Generic) 1 x Per Day/30 Days ary Discharge Instructions: Apply to wound bed and cut to size of wound Secondary Dressing: ABD Pad, 5x9 (Generic) 1 x Per Day/30 Days Discharge Instructions: Apply over primary dressing as directed. Secondary Dressing: Woven Gauze Sponge, Non-Sterile 4x4 in (Generic) 1 x Per Day/30 Days Discharge Instructions: Apply over primary dressing as directed. Secondary Dressing: heel ortho felt 1 x Per Day/30 Days Discharge Instructions: apply in shoe to aid in offloading of heel and wound. Secured With: Web designer, Sterile 4x75 (in/in) (Generic) 1 x Per Day/30 Days Discharge Instructions: Secure with stretch gauze as directed. Secured With: 48M Medipore H Soft Cloth Surgical T ape, 4 x 10 (in/yd) (Generic) 1 x Per Day/30 Days Discharge Instructions: Secure with tape as directed. WOUND #4R:  - Foot Wound Laterality: Plantar, Right, Distal Cleanser: Soap and Water 1 x Per Day/30 Days Discharge Instructions: May shower and wash wound with dial antibacterial soap and water prior to dressing change. Cleanser: Wound Cleanser (Generic) 1 x Per Day/30 Days Discharge Instructions: Cleanse the wound with wound cleanser prior to applying a clean dressing using gauze sponges, not tissue or cotton balls. Peri-Wound Care: Sween Lotion (Moisturizing lotion) 1 x Per Day/30 Days Discharge Instructions: Apply moisturizing lotion as directed Prim Dressing: Sorbalgon AG Dressing 2x2 (in/in) (Generic) 1 x Per Day/30 Days ary Discharge Instructions: Apply to wound bed and cut to size of the wound Secondary Dressing: ABD Pad, 5x9 (Generic) 1 x Per Day/30 Days Discharge Instructions: Apply over primary dressing as directed. Secondary Dressing: Woven Gauze Sponge, Non-Sterile 4x4 in (Generic) 1 x Per Day/30 Days Discharge Instructions: Apply over primary dressing as directed. Secondary Dressing: heel ortho felt 1 x Per Day/30 Days Discharge Instructions: apply in shoe to aid in offloading of heel and wound. Secured With: Web designer, Sterile 4x75 (in/in) (Generic) 1 x Per Day/30 Days Discharge Instructions: Secure with stretch gauze as directed. Secured With: 48M Medipore H Soft Cloth Surgical T ape, 4 x 10 (in/yd) (Generic) 1 x Per Day/30 Days Discharge Instructions: Secure with tape as directed. 1. Based on what I am seeing I do think the patient should continue with the silver alginate dressings. Also think she is to try to keep pressure off of the heel is much as possible. 2. I am also going to recommend she continue with the IV antibiotics this seems  to be doing great and in general I think that she is making really good progress. Palmer, [name] ([id number]) [id number] Page 11 of 11 We will see patient back for reevaluation in 1 week here in the  clinic. If anything worsens or changes patient will contact our office for additional recommendations. Electronic Signature(s) Signed: 03/19/2023 4:35:09 PM By: Allen Derry PA-C Entered By: Allen Derry on 03/19/2023 16:35:08 -------------------------------------------------------------------------------- SuperBill Details Patient Name: Date of Service: ANERI, [name] 03/19/2023 Medical Record Number: [id number] Patient Account Number: [id number] Date of Birth/Sex: Treating RN: 12/01/1965 (57 y.o. F) Primary Care Provider: [name] Other Clinician: Referring Provider: Treating Provider/Extender: [name] in Treatment: 40 Diagnosis Coding ICD-10 Codes Code Description [phone] Other chronic osteomyelitis, right ankle and foot E11.621 Type 2 diabetes mellitus with foot ulcer L97.412 Non-pressure chronic ulcer of right heel and midfoot with fat layer exposed I89.0 Lymphedema, not elsewhere classified L97.812 Non-pressure chronic ulcer of other part of right lower leg with fat layer exposed Facility Procedures : CPT4 Code: [id number] Description: 11042 - DEB SUBQ TISSUE 20 SQ CM/< ICD-10 Diagnosis Description L97.412 Non-pressure chronic ulcer of right heel and midfoot with fat layer exposed Modifier: Quantity: 1 : CPT4 Code: [id number] Description: 97597 - DEBRIDE WOUND 1ST 20 SQ CM OR < ICD-10 Diagnosis Description L97.412 Non-pressure chronic ulcer of right heel and midfoot with fat layer exposed Modifier: Quantity: 1 Physician Procedures : CPT4 Code Description Modifier [id number] 11042 - WC PHYS SUBQ TISS 20 SQ CM ICD-10 Diagnosis Description L97.412 Non-pressure chronic ulcer of right heel and midfoot with fat layer exposed Quantity: 1 : [id number] 97597 - WC PHYS DEBR WO ANESTH 20 SQ CM ICD-10 Diagnosis Description L97.412 Non-pressure chronic ulcer of right heel and midfoot with fat layer exposed Quantity: 1 Electronic Signature(s) Signed: 03/19/2023 4:36:22  PM By: Allen Derry PA-C Entered By: Allen Derry on 03/19/2023 16:36:21

## 2023-03-22 NOTE — Progress Notes (Signed)
Nicks, [name] ([id number]) [id number] Page 1 of 8 Visit Report for 03/19/2023 Arrival Information Details Patient Name: Date of Service: [name], [name] 03/19/2023 9:30 A M Medical Record Number: [id number] Patient Account Number: [id number] Date of Birth/Sex: Treating RN: [birth year] (57 y.o. [name] Primary Care Nelma Phagan: [name] Other Clinician: Referring Jebadiah Imperato: Treating Anureet Bruington/Extender: [name] in Treatment: 40 Visit Information History Since Last Visit Added or deleted any medications: No Patient Arrived: Ambulatory Any new allergies or adverse reactions: No Arrival Time: 10:01 Had a fall or experienced change in No Accompanied By: son activities of daily living that may affect Transfer Assistance: None risk of falls: Patient Identification Verified: Yes Signs or symptoms of abuse/neglect since last visito No Patient Requires Transmission-Based Precautions: No Hospitalized since last visit: No Patient Has Alerts: Yes Implantable device outside of the clinic excluding No Patient Alerts: PICC in left arm cellular tissue based products placed in the center since last visit: Has Dressing in Place as Prescribed: Yes Pain Present Now: Yes Electronic Signature(s) Signed: 03/19/2023 5:42:48 PM By: Karie Schwalbe RN Entered By: Karie Schwalbe on 03/19/2023 10:01:38 -------------------------------------------------------------------------------- Encounter Discharge Information Details Patient Name: Date of Service: [name], [name] 03/19/2023 9:30 A M Medical Record Number: [id number] Patient Account Number: [id number] Date of Birth/Sex: Treating RN: [birth year] (57 y.o. [name] Primary Care Ophelia Sipe: [name] Other Clinician: Referring Chantae Soo: Treating Kellsey Sansone/Extender: [name] in Treatment: 64 Encounter Discharge Information Items Post Procedure Vitals Discharge  Condition: Stable Temperature (F): 98.3 Ambulatory Status: Ambulatory Pulse (bpm): 83 Discharge Destination: Home Respiratory Rate (breaths/min): 18 Transportation: Private Auto Blood Pressure (mmHg): 108/75 Accompanied By: son Schedule Follow-up Appointment: Yes Clinical Summary of Care: Patient Declined Electronic Signature(s) Signed: 03/19/2023 5:42:48 PM By: Karie Schwalbe RN Entered By: Karie Schwalbe on 03/19/2023 10:48:07 Schuman, Saidee ([id number]) [id number] Page 2 of 8 -------------------------------------------------------------------------------- Lower Extremity Assessment Details Patient Name: Date of Service: [name] 03/19/2023 9:30 A M Medical Record Number: [id number] Patient Account Number: [id number] Date of Birth/Sex: Treating RN: [birth year] (57 y.o. [name] Primary Care Hayes Czaja: [name] Other Clinician: Referring Kaileah Shevchenko: Treating Danetra Glock/Extender: [name] in Treatment: 40 Edema Assessment Assessed: [Left: No] [Right: No] Edema: [Left: Ye] [Right: s] Calf Left: Right: Point of Measurement: 32 cm From Medial Instep 52.8 cm Ankle Left: Right: Point of Measurement: 10 cm From Medial Instep 32.1 cm Vascular Assessment Pulses: Dorsalis Pedis Palpable: [Right:Yes] Electronic Signature(s) Signed: 03/19/2023 5:42:48 PM By: Karie Schwalbe RN Entered By: Karie Schwalbe on 03/19/2023 10:00:58 -------------------------------------------------------------------------------- Multi-Disciplinary Care Plan Details Patient Name: Date of Service: [name] 03/19/2023 9:30 A M Medical Record Number: [id number] Patient Account Number: [id number] Date of Birth/Sex: Treating RN: [birth year] (57 y.o. [name] Primary Care Laniya Friedl: [name] Other Clinician: Referring Jovanie Verge: Treating Jaclynn Laumann/Extender: [name] in Treatment: 40 Active  Inactive Wound/Skin Impairment Nursing Diagnoses: Impaired tissue integrity Knowledge deficit related to ulceration/compromised skin integrity Goals: Patient will have a decrease in wound volume by X% from date: (specify in notes) Date Initiated: 06/12/2022 Target Resolution Date: 05/07/2023 Goal Status: Active Patient/caregiver will verbalize understanding of skin care regimen Date Initiated: 06/12/2022 Target Resolution Date: 05/07/2023 Goal Status: Active Ulcer/skin breakdown will have a volume reduction of 30% by week 4 Debois, Keerat ([id number]) [id number] Page 3 of 8 Date Initiated: 06/12/2022 Date Inactivated: 07/31/2022 Target Resolution Date: 08/03/2022 Goal Status: Unmet Unmet Reason: dx with osteomyelitis. Ulcer/skin breakdown will have a volume reduction of  50% by week 8 Date Initiated: 06/12/2022 Date Inactivated: 10/16/2022 Target Resolution Date: 10/05/2022 Unmet Reason: see wound Goal Status: Unmet measurement. IV antibiotics Interventions: Assess patient/caregiver ability to obtain necessary supplies Assess patient/caregiver ability to perform ulcer/skin care regimen upon admission and as needed Assess ulceration(s) every visit Notes: Electronic Signature(s) Signed: 03/19/2023 5:42:48 PM By: Karie Schwalbe RN Entered By: Karie Schwalbe on 03/19/2023 10:40:31 -------------------------------------------------------------------------------- Pain Assessment Details Patient Name: Date of Service: [name], [name] 03/19/2023 9:30 A M Medical Record Number: [id number] Patient Account Number: [id number] Date of Birth/Sex: Treating RN: May 13, 1966 (57 y.o. [name] Primary Care Dody Smartt: [name] Other Clinician: Referring Soul Deveney: Treating Caterina Racine/Extender: [name] in Treatment: 40 Active Problems Location of Pain Severity and Description of Pain Patient Has Paino Yes Site Locations Pain  Location: Generalized Pain With Dressing Change: No Duration of the Pain. Constant / Intermittento Constant Rate the pain. Current Pain Level: 2 Worst Pain Level: 10 Least Pain Level: 2 Tolerable Pain Level: 5 Character of Pain Describe the Pain: Aching Pain Management and Medication Current Pain Management: Medication: Yes Cold Application: No Rest: Yes Massage: No Activity: No T.E.N.S.: No Heat Application: No Leg drop or elevation: No Is the Current Pain Management Adequate: Adequate How does your wound impact your activities of daily livingo Sleep: No Bathing: No Appetite: No Relationship With Others: No Bladder Continence: No Emotions: No Bowel Continence: No Work: No Toileting: No Drive: No Cifuentes, Jeda ([id number]) [id number] Page 4 of 8 Dressing: No Hobbies: No Electronic Signature(s) Signed: 03/19/2023 5:42:48 PM By: Karie Schwalbe RN Entered By: Karie Schwalbe on 03/19/2023 10:18:48 -------------------------------------------------------------------------------- Patient/Caregiver Education Details Patient Name: Date of Service: Darral Dash 6/12/2024andnbsp9:30 A M Medical Record Number: [id number] Patient Account Number: [id number] Date of Birth/Gender: Treating RN: 10-22-1965 (57 y.o. [name] Primary Care Physician: [name] Other Clinician: Referring Physician: Treating Physician/Extender: [name] in Treatment: 37 Education Assessment Education Provided To: Patient Education Topics Provided Wound/Skin Impairment: Methods: Explain/Verbal Responses: Return demonstration correctly Electronic Signature(s) Signed: 03/19/2023 5:42:48 PM By: Karie Schwalbe RN Entered By: Karie Schwalbe on 03/19/2023 10:40:47 -------------------------------------------------------------------------------- Wound Assessment Details Patient Name: Date of Service: [name], [name] 03/19/2023 9:30 A  M Medical Record Number: [id number] Patient Account Number: [id number] Date of Birth/Sex: Treating RN: [birth year] (57 y.o. [name] Primary Care Ein Rijo: [name] Other Clinician: Referring Vicent Febles: Treating Loui Massenburg/Extender: [name] in Treatment: 40 Wound Status Wound Number: 1 Primary Etiology: Diabetic Wound/Ulcer of the Lower Extremity Wound Location: Right Calcaneus Wound Status: Open Wounding Event: Gradually Appeared Comorbid History: Hypertension, Type II Diabetes, Osteomyelitis Date Acquired: 11/07/2020 Weeks Of Treatment: 40 Clustered Wound: Yes Photos Alden, Janya ([id number]) [id number] Page 5 of 8 Wound Measurements Length: (cm) Width: (cm) Depth: (cm) Clustered Quantity: Area: (cm) Volume: (cm) 0.4 % Reduction in Area: 99.5% 0.2 % Reduction in Volume: 99.7% 0.1 Epithelialization: Medium (34-66%) 1 Tunneling: No 0.063 Undermining: No 0.006 Wound Description Classification: Grade 3 Wound Margin: Thickened Exudate Amount: Medium Exudate Type: Serosanguineous Exudate Color: red, brown Foul Odor After Cleansing: No Slough/Fibrino Yes Wound Bed Granulation Amount: Large (67-100%) Exposed Structure Granulation Quality: Red, Pink Fascia Exposed: No Necrotic Amount: Small (1-33%) Fat Layer (Subcutaneous Tissue) Exposed: Yes Necrotic Quality: Eschar, Adherent Slough Tendon Exposed: No Muscle Exposed: No Joint Exposed: No Bone Exposed: No Periwound Skin Texture Texture Color No Abnormalities Noted: No No Abnormalities Noted: No Callus: Yes Atrophie Blanche: No Crepitus: No Cyanosis: No Excoriation: No Ecchymosis: No  Induration: No Erythema: No Rash: No Hemosiderin Staining: No Scarring: No Mottled: No Pallor: No Moisture Rubor: No No Abnormalities Noted: No Dry / Scaly: No Temperature / Pain Maceration: Yes Temperature: No Abnormality Tenderness on Palpation:  Yes Treatment Notes Wound #1 (Calcaneus) Wound Laterality: Right Cleanser Soap and Water Discharge Instruction: May shower and wash wound with dial antibacterial soap and water prior to dressing change. Wound Cleanser Discharge Instruction: Cleanse the wound with wound cleanser prior to applying a clean dressing using gauze sponges, not tissue or cotton balls. Peri-Wound Care Sween Lotion (Moisturizing lotion) Discharge Instruction: Apply moisturizing lotion as directed Topical Primary Dressing Sorbalgon AG Dressing 2x2 (in/in) Discharge Instruction: Apply to wound bed and cut to size of wound Secondary Dressing Marzano, Ruthella ([id number]) [id number] Page 6 of 8 ABD Pad, 5x9 Discharge Instruction: Apply over primary dressing as directed. Woven Gauze Sponge, Non-Sterile 4x4 in Discharge Instruction: Apply over primary dressing as directed. heel ortho felt Discharge Instruction: apply in shoe to aid in offloading of heel and wound. Secured With Conforming Stretch Gauze Bandage Roll, Sterile 4x75 (in/in) Discharge Instruction: Secure with stretch gauze as directed. 78M Medipore H Soft Cloth Surgical T ape, 4 x 10 (in/yd) Discharge Instruction: Secure with tape as directed. Compression Wrap Compression Stockings Add-Ons Electronic Signature(s) Signed: 03/19/2023 5:42:48 PM By: Karie Schwalbe RN Signed: 03/20/2023 5:31:32 PM By: Shawn Stall RN, BSN Entered By: Shawn Stall on 03/19/2023 10:05:44 -------------------------------------------------------------------------------- Wound Assessment Details Patient Name: Date of Service: [name], [name] 03/19/2023 9:30 A M Medical Record Number: [id number] Patient Account Number: [id number] Date of Birth/Sex: Treating RN: 1966-08-20 (57 y.o. [name] Primary Care Rayah Fines: [name] Other Clinician: Referring Orlondo Holycross: Treating Thais Silberstein/Extender: [name] in Treatment:  40 Wound Status Wound Number: 4R Primary Etiology: Diabetic Wound/Ulcer of the Lower Extremity Wound Location: Right, Distal, Plantar Foot Wound Status: Open Wounding Event: Blister Comorbid History: Hypertension, Type II Diabetes, Osteomyelitis Date Acquired: 11/08/2020 Weeks Of Treatment: 22 Clustered Wound: Yes Photos Wound Measurements Length: (cm) Width: (cm) Depth: (cm) Clustered Quantity: Area: (cm) Volume: (cm) 0.2 % Reduction in Area: 98.8% 0.2 % Reduction in Volume: 99.4% 0.1 Epithelialization: Large (67-100%) 1 Tunneling: No 0.031 Undermining: No 0.003 Wound Description Classification: Grade 3 Wound Margin: Distinct, outline attached Gains, Alesa ([id number]) Exudate Amount: Medium Exudate Type: Serosanguineous Exudate Color: red, brown Foul Odor After Cleansing: No Slough/Fibrino No [id number] Page 7 of 8 Wound Bed Granulation Amount: None Present (0%) Exposed Structure Necrotic Amount: Large (67-100%) Fascia Exposed: No Necrotic Quality: Eschar, Adherent Slough Fat Layer (Subcutaneous Tissue) Exposed: Yes Tendon Exposed: No Muscle Exposed: No Joint Exposed: No Bone Exposed: No Periwound Skin Texture Texture Color No Abnormalities Noted: No No Abnormalities Noted: No Callus: Yes Atrophie Blanche: No Crepitus: No Cyanosis: No Excoriation: No Ecchymosis: No Induration: No Erythema: No Rash: No Hemosiderin Staining: No Scarring: No Mottled: No Pallor: No Moisture Rubor: No No Abnormalities Noted: No Dry / Scaly: No Maceration: No Treatment Notes Wound #4R (Foot) Wound Laterality: Plantar, Right, Distal Cleanser Soap and Water Discharge Instruction: May shower and wash wound with dial antibacterial soap and water prior to dressing change. Wound Cleanser Discharge Instruction: Cleanse the wound with wound cleanser prior to applying a clean dressing using gauze sponges, not tissue or cotton balls. Peri-Wound  Care Sween Lotion (Moisturizing lotion) Discharge Instruction: Apply moisturizing lotion as directed Topical Primary Dressing Sorbalgon AG Dressing 2x2 (in/in) Discharge Instruction: Apply to wound bed and cut to size of the wound Secondary Dressing ABD  Pad, 5x9 Discharge Instruction: Apply over primary dressing as directed. Woven Gauze Sponge, Non-Sterile 4x4 in Discharge Instruction: Apply over primary dressing as directed. heel ortho felt Discharge Instruction: apply in shoe to aid in offloading of heel and wound. Secured With Conforming Stretch Gauze Bandage Roll, Sterile 4x75 (in/in) Discharge Instruction: Secure with stretch gauze as directed. 6M Medipore H Soft Cloth Surgical T ape, 4 x 10 (in/yd) Discharge Instruction: Secure with tape as directed. Compression Wrap Compression Stockings Add-Ons Electronic Signature(s) Signed: 03/19/2023 5:42:48 PM By: Karie Schwalbe RN Entered By: Karie Schwalbe on 03/19/2023 10:29:37 Hillary, Makynli ([id number]) [id number] Page 8 of 8 -------------------------------------------------------------------------------- Vitals Details Patient Name: Date of Service: [name], [name] 03/19/2023 9:30 A M Medical Record Number: [id number] Patient Account Number: [id number] Date of Birth/Sex: Treating RN: August 12, 1966 (58 y.o. [name] Primary Care Duriel Deery: [name] Other Clinician: Referring Tniyah Nakagawa: Treating Unnamed Hino/Extender: [name] in Treatment: 40 Vital Signs Time Taken: 10:01 Temperature (F): 98.3 Height (in): 65 Pulse (bpm): 83 Weight (lbs): 331 Respiratory Rate (breaths/min): 18 Body Mass Index (BMI): 55.1 Blood Pressure (mmHg): 108/75 Reference Range: 80 - 120 mg / dl Electronic Signature(s) Signed: 03/19/2023 5:42:48 PM By: Karie Schwalbe RN Entered By: Karie Schwalbe on 03/19/2023 10:12:27

## 2023-03-26 ENCOUNTER — Encounter (HOSPITAL_BASED_OUTPATIENT_CLINIC_OR_DEPARTMENT_OTHER): Payer: No Typology Code available for payment source | Admitting: Physician Assistant

## 2023-03-26 DIAGNOSIS — E11621 Type 2 diabetes mellitus with foot ulcer: Secondary | ICD-10-CM | POA: Diagnosis not present

## 2023-03-26 NOTE — Progress Notes (Addendum)
Brethauer, [name] ([id number]) [id number] Page 1 of 4 Visit Report for 03/26/2023 Chief Complaint Document Details Patient Name: Date of Service: [name], [name] 03/26/2023 9:30 A M Medical Record Number: [id number] Patient Account Number: [id number] Date of Birth/Sex: Treating RN: 1966/02/20 (57 y.o. F) Primary Care Provider: [name] Other Clinician: Referring Provider: Treating Provider/Extender: [name] in Treatment: 17 Information Obtained from: Patient Chief Complaint Right heel ulcer Electronic Signature(s) Signed: 03/26/2023 9:57:46 AM By: Allen Derry PA-C Entered By: Allen Derry on 03/26/2023 09:57:46 -------------------------------------------------------------------------------- Debridement Details Patient Name: Date of Service: [name], [name] 03/26/2023 9:30 A M Medical Record Number: [id number] Patient Account Number: [id number] Date of Birth/Sex: Treating RN: [birth year] (57 y.o. [name] Primary Care Provider: [name] Other Clinician: Referring Provider: Treating Provider/Extender: [name] in Treatment: 15 Debridement Performed for Assessment: Wound #1 Right Calcaneus Performed By: Physician Lenda Kelp, PA Debridement Type: Debridement Severity of Tissue Pre Debridement: Fat layer exposed Level of Consciousness (Pre-procedure): Awake and Alert Pre-procedure Verification/Time Out Yes - 09:50 Taken: Start Time: 09:50 Pain Control: Lidocaine 5% topical ointment Percent of Wound Bed Debrided: 100% T Area Debrided (cm): otal 0.24 Tissue and other material debrided: Viable, Non-Viable, Callus, Slough, Subcutaneous, Slough Level: Skin/Subcutaneous Tissue Debridement Description: Excisional Instrument: Curette Bleeding: Minimum Hemostasis Achieved: Pressure End Time: 09:52 Procedural Pain: 0 Post Procedural Pain: 0 Response to Treatment: Procedure was tolerated  well Level of Consciousness (Post- Awake and Alert procedure): Post Debridement Measurements of Total Wound Length: (cm) 0.6 Width: (cm) 0.5 Depth: (cm) 0.2 Volume: (cm) 0.047 Character of Wound/Ulcer Post Debridement: Improved [name], [name] ([id number]) [id number] Page 2 of 4 Severity of Tissue Post Debridement: Fat layer exposed Post Procedure Diagnosis Same as Pre-procedure Notes Scribed for Allen Derry III PA-C, by [employer]) Signed: 03/26/2023 5:34:23 PM By: Karie Schwalbe RN Signed: 03/27/2023 5:52:49 PM By: Allen Derry PA-C Entered By: Karie Schwalbe on 03/26/2023 10:03:31 -------------------------------------------------------------------------------- Physician Orders Details Patient Name: Date of Service: [name], [name] 03/26/2023 9:30 A M Medical Record Number: [id number] Patient Account Number: [id number] Date of Birth/Sex: Treating RN: 01-Jun-1966 (57 y.o. 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 Allen Derry III PA-C Room 9 Wednesday 04/02/23 at 10:15am Return A Other: - PICC line 02/13/2023 with [facility]. Anesthetic (In clinic) Topical Lidocaine 5% applied to wound bed Bathing/ Shower/ Hygiene May shower with protection but do not get wound dressing(s) wet. Protect dressing(s) with water repellant cover (for example, large plastic bag) or a cast cover and  may then take shower. Edema Control - Lymphedema / SCD / Other Elevate legs to the level of the heart or above for 30 minutes daily and/or when sitting for 3-4 times a day throughout the day. Avoid standing for long periods of time. Off-Loading Other: - Minimize walking and standing as much as possible to right foot to aid in offloading to wound. While at work use the wheelchair for mobility. felt in shoe to aid in offloading wound and heel. Wound Treatment Wound #  1 - Calcaneus Wound Laterality: Right Cleanser: Soap and Water 1 x Per Day/30 Days Discharge Instructions: May shower and wash wound with dial antibacterial soap and water prior to dressing change. Cleanser: Wound Cleanser (Generic) 1 x Per Day/30 Days Discharge Instructions: Cleanse the wound with wound cleanser prior to applying a clean dressing using gauze sponges, not tissue or cotton balls. Peri-Wound Care: Sween Lotion (Moisturizing lotion) 1 x Per Day/30 Days Discharge Instructions: Apply moisturizing lotion as directed Prim Dressing: Sorbalgon AG Dressing 2x2 (in/in) (Generic) 1 x Per Day/30 Days ary Discharge Instructions: Apply to wound bed and cut to size of wound [name], [name] ([id number]) [id number] Page 3 of 4 Secondary Dressing: ABD Pad, 5x9 (Generic) 1 x Per Day/30 Days Discharge Instructions: Apply over primary dressing as directed. Secondary Dressing: Woven Gauze Sponge, Non-Sterile 4x4 in (Generic) 1 x Per Day/30 Days Discharge Instructions: Apply over primary dressing as directed. Secondary Dressing: heel ortho felt 1 x Per Day/30 Days Discharge Instructions: apply in shoe to aid in offloading of heel and wound. (if not using the ABD pad) Secured With: Conforming Stretch Gauze Bandage Roll, Sterile 4x75 (in/in) (Generic) 1 x Per Day/30 Days Discharge Instructions: Secure with stretch gauze as directed. Secured With: 38M Medipore H Soft Cloth Surgical T ape, 4 x 10 (in/yd) (Generic) 1  x Per Day/30 Days Discharge Instructions: Secure with tape as directed. Electronic Signature(s) Signed: 03/26/2023 5:34:23 PM By: Karie Schwalbe RN Signed: 03/27/2023 5:52:49 PM By: Allen Derry PA-C Entered By: Karie Schwalbe on 03/26/2023 10:07:12 -------------------------------------------------------------------------------- Problem List Details Patient Name: Date of Service: [name], [name] 03/26/2023 9:30 A M Medical Record Number: [id number] Patient Account Number: [id number] Date of Birth/Sex: Treating RN: 19-Oct-1965 (57 y.o. 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

## 2023-03-27 NOTE — Progress Notes (Signed)
[place], [name] ([id number]) [id number] Page 1 of 7 Visit Report for 03/26/2023 Arrival Information Details Patient Name: Date of Service: [name], [name] 03/26/2023 9:30 A M Medical Record Number: [id number] Patient Account Number: [id number] Date of Birth/Sex: Treating RN: [birth year] (57 y.o. [name] Primary Care [name]: [name] Other Clinician: Referring [name]: Treating [name]/Extender: [name] in Treatment: 21 Visit Information History Since Last Visit Added or deleted any medications: No Patient Arrived: Ambulatory Any new allergies or adverse reactions: No Arrival Time: 09:33 Had a fall or experienced change in No Accompanied By: son activities of daily living that may affect Transfer Assistance: None risk of falls: Patient Identification Verified: Yes Signs or symptoms of abuse/neglect since last visito No Patient Requires Transmission-Based Precautions: No Hospitalized since last visit: No Patient Has Alerts: Yes Implantable device outside of the clinic excluding No Patient Alerts: PICC in left arm cellular tissue based products placed in the center since last visit: Has Dressing in Place as Prescribed: Yes Pain Present Now: No Electronic Signature(s) Signed: 03/26/2023 5:34:23 PM By: [name] Schwalbe RN Entered By: [name] on 03/26/2023 09:33:43 -------------------------------------------------------------------------------- Encounter Discharge Information Details Patient Name: Date of Service: [name], [name] 03/26/2023 9:30 A M Medical Record Number: [id number] Patient Account Number: [id number] Date of Birth/Sex: Treating RN: 22-Aug-1966 (57 y.o. [name] Primary Care [name]: [name] Other Clinician: Referring [name]: Treating [name]/Extender: [name] in Treatment: 57 Encounter Discharge Information Items Post Procedure Vitals Discharge  Condition: Stable Temperature (F): 97.7 Ambulatory Status: Ambulatory Pulse (bpm): 83 Discharge Destination: Home Respiratory Rate (breaths/min): 18 Transportation: Private Auto Blood Pressure (mmHg): 123/87 Accompanied By: self Schedule Follow-up Appointment: Yes Clinical Summary of Care: Patient Declined Electronic Signature(s) Signed: 03/26/2023 5:34:23 PM By: [name] Schwalbe RN Entered By: [name] on 03/26/2023 17:31:47 [name] ([id number]) [id number] Page 2 of 7 -------------------------------------------------------------------------------- Lower Extremity Assessment Details Patient Name: Date of Service: [name], [name] 03/26/2023 9:30 A M Medical Record Number: [id number] Patient Account Number: [id number] Date of Birth/Sex: Treating RN: 28-Sep-1966 (57 y.o. [name] Primary Care Mouhamad Teed: [name] Other Clinician: Referring [name]: Treating [name]/Extender: [name] in Treatment: 41 Edema Assessment Assessed: [Left: No] [Right: No] Edema: [Left: Ye] [Right: s] Calf Left: Right: Point of Measurement: 32 cm From Medial Instep 52 cm Ankle Left: Right: Point of Measurement: 10 cm From Medial Instep 32.9 cm Vascular Assessment Pulses: Dorsalis Pedis Palpable: [Right:Yes] Electronic Signature(s) Signed: 03/26/2023 5:34:23 PM By: [name] Schwalbe RN Entered By: [name] on 03/26/2023 09:59:06 -------------------------------------------------------------------------------- Multi-Disciplinary Care Plan Details Patient Name: Date of Service: [name], [name] 03/26/2023 9:30 A M Medical Record Number: [id number] Patient Account Number: [id number] Date of Birth/Sex: Treating RN: 11/16/1965 (57 y.o. [name] Primary Care [name]: [name] Other Clinician: Referring [name]: Treating [name]/Extender: [name] in Treatment: 71 Active  Inactive Wound/Skin Impairment Nursing Diagnoses: Impaired tissue integrity Knowledge deficit related to ulceration/compromised skin integrity Goals: Patient will have a decrease in wound volume by X% from date: (specify in notes) Date Initiated: 06/12/2022 Target Resolution Date: 05/07/2023 Goal Status: Active Patient/caregiver will verbalize understanding of skin care regimen Date Initiated: 06/12/2022 Target Resolution Date: 05/07/2023 Goal Status: Active Ulcer/skin breakdown will have a volume reduction of 30% by week 4 [name] ([id number]) [id number] Page 3 of 7 Date Initiated: 06/12/2022 Date Inactivated: 07/31/2022 Target Resolution Date: 08/03/2022 Goal Status: Unmet Unmet Reason: dx with osteomyelitis. Ulcer/skin breakdown will have a volume reduction of  50% by week 8 Date Initiated: 06/12/2022 Date Inactivated: 10/16/2022 Target Resolution Date: 10/05/2022 Unmet Reason: see wound Goal Status: Unmet measurement. IV antibiotics Interventions: Assess patient/caregiver ability to obtain necessary supplies Assess patient/caregiver ability to perform ulcer/skin care regimen upon admission and as needed Assess ulceration(s) every visit Notes: Electronic Signature(s) Signed: 03/26/2023 5:34:23 PM By: [name] Schwalbe RN Entered By: [name] on 03/26/2023 17:29:10 -------------------------------------------------------------------------------- Pain Assessment Details Patient Name: Date of Service: [name], [name] 03/26/2023 9:30 A M Medical Record Number: [id number] Patient Account Number: [id number] Date of Birth/Sex: Treating RN: August 15, 1966 (57 y.o. [name] Primary Care Gamaliel [name]: [name] Other Clinician: Referring Cherith Tewell: Treating Shaquetta [name]/Extender: [name] in Treatment: 29 Active Problems Location of Pain Severity and Description of Pain Patient Has Paino No Site Locations Pain Management and  Medication Current Pain Management: Electronic Signature(s) Signed: 03/26/2023 5:34:23 PM By: [name] Schwalbe RN Entered By: [name] on 03/26/2023 09:34:46 [name] ([id number]) [id number] Page 4 of 7 -------------------------------------------------------------------------------- Patient/Caregiver Education Details Patient Name: Date of Service: [name], [name] 6/19/2024andnbsp9:30 A M Medical Record Number: [id number] Patient Account Number: [id number] Date of Birth/Gender: Treating RN: [birth year] (57 y.o. [name] Primary Care Physician: [name] Other Clinician: Referring Physician: Treating Physician/Extender: [name] in Treatment: 20 Education Assessment Education Provided To: Patient Education Topics Provided Wound/Skin Impairment: Methods: Explain/Verbal Responses: Return demonstration correctly Electronic Signature(s) Signed: 03/26/2023 5:34:23 PM By: [name] Schwalbe RN Entered By: [name] on 03/26/2023 17:29:26 -------------------------------------------------------------------------------- Wound Assessment Details Patient Name: Date of Service: [name], [name] 03/26/2023 9:30 A M Medical Record Number: [id number] Patient Account Number: [id number] Date of Birth/Sex: Treating RN: [birth year] (57 y.o. [name] Primary Care Mellisa Arshad: [name] Other Clinician: Referring Arvetta Araque: Treating Chuckie Mccathern/Extender: [name] in Treatment: 41 Wound Status Wound Number: 1 Primary Etiology: Diabetic Wound/Ulcer of the Lower Extremity Wound Location: Right Calcaneus Wound Status: Open Wounding Event: Gradually Appeared Comorbid History: Hypertension, Type II Diabetes, Osteomyelitis Date Acquired: 11/07/2020 Weeks Of Treatment: 41 Clustered Wound: Yes Photos Wound Measurements Length: (cm) 0. Width: (cm) 0. Depth: (cm) 0. Clustered Quantity: 1 Area: (cm)  0 Volume: (cm) 0 6 % Reduction in Area: 98% 5 % Reduction in Volume: 98% 2 Epithelialization: Medium (34-66%) Tunneling: No .236 Undermining: No .047 Wound Description Gorczyca, Fabiola ([id number]) Classification: Grade 3 Wound Margin: Thickened Exudate Amount: Medium Exudate Type: Serosanguineous Exudate Color: red, brown [id number] Page 5 of 7 Foul Odor After Cleansing: No Slough/Fibrino Yes Wound Bed Granulation Amount: Large (67-100%) Exposed Structure Granulation Quality: Red, Pink Fascia Exposed: No Necrotic Amount: Small (1-33%) Fat Layer (Subcutaneous Tissue) Exposed: Yes Necrotic Quality: Eschar, Adherent Slough Tendon Exposed: No Muscle Exposed: No Joint Exposed: No Bone Exposed: No Periwound Skin Texture Texture Color No Abnormalities Noted: No No Abnormalities Noted: No Callus: Yes Atrophie Blanche: No Crepitus: No Cyanosis: No Excoriation: No Ecchymosis: No Induration: No Erythema: No Rash: No Hemosiderin Staining: No Scarring: No Mottled: No Pallor: No Moisture Rubor: No No Abnormalities Noted: No Dry / Scaly: No Temperature / Pain Maceration: Yes Temperature: No Abnormality Tenderness on Palpation: Yes Treatment Notes Wound #1 (Calcaneus) Wound Laterality: Right Cleanser Soap and Water Discharge Instruction: May shower and wash wound with dial antibacterial soap and water prior to dressing change. Wound Cleanser Discharge Instruction: Cleanse the wound with wound cleanser prior to applying a clean dressing using gauze sponges, not tissue or cotton balls. Peri-Wound Care Sween Lotion (Moisturizing lotion) Discharge Instruction: Apply moisturizing lotion as  directed Topical Primary Dressing Sorbalgon AG Dressing 2x2 (in/in) Discharge Instruction: Apply to wound bed and cut to size of wound Secondary Dressing ABD Pad, 5x9 Discharge Instruction: Apply over primary dressing as directed. Woven Gauze Sponge,  Non-Sterile 4x4 in Discharge Instruction: Apply over primary dressing as directed. heel ortho felt Discharge Instruction: apply in shoe to aid in offloading of heel and wound. (if not using the ABD pad) Secured With Conforming Stretch Gauze Bandage Roll, Sterile 4x75 (in/in) Discharge Instruction: Secure with stretch gauze as directed. 79M Medipore H Soft Cloth Surgical T ape, 4 x 10 (in/yd) Discharge Instruction: Secure with tape as directed. Compression Wrap Compression Stockings Add-Ons Electronic Signature(s) Signed: 03/26/2023 5:34:23 PM By: [name] Schwalbe RN [name], [name] ([id number]) [id number] Page 6 of 7 Entered By: [name] on 03/26/2023 09:50:30 -------------------------------------------------------------------------------- Wound Assessment Details Patient Name: Date of Service: [name], [name] 03/26/2023 9:30 A M Medical Record Number: [id number] Patient Account Number: [id number] Date of Birth/Sex: Treating RN: Feb 11, 1966 (57 y.o. [name] Primary Care Jeannifer Drakeford: [name] Other Clinician: Referring Cassidy Tashiro: Treating Kadden Osterhout/Extender: [name] in Treatment: 41 Wound Status Wound Number: [id number] Primary Etiology: Diabetic Wound/Ulcer of the Lower Extremity Wound Location: Right, Distal, Plantar Foot Wound Status: Healed - Epithelialized Wounding Event: Blister Comorbid History: Hypertension, Type II Diabetes, Osteomyelitis Date Acquired: 11/08/2020 Weeks Of Treatment: 23 Clustered Wound: Yes Photos Wound Measurements Length: (cm) Width: (cm) Depth: (cm) Clustered Quantity: Area: (cm) Volume: (cm) 0 % Reduction in Area: 100% 0 % Reduction in Volume: 100% 0 Epithelialization: Large (67-100%) 1 Tunneling: No 0 Undermining: No 0 Wound Description Classification: Grade 3 Wound Margin: Distinct, outline attached Exudate Amount: None Present Foul Odor After Cleansing: No Slough/Fibrino No Wound  Bed Granulation Amount: None Present (0%) Exposed Structure Necrotic Amount: None Present (0%) Fascia Exposed: No Fat Layer (Subcutaneous Tissue) Exposed: No Tendon Exposed: No Muscle Exposed: No Joint Exposed: No Bone Exposed: No Periwound Skin Texture Texture Color No Abnormalities Noted: Yes No Abnormalities Noted: Yes Moisture Temperature / Pain No Abnormalities Noted: No Temperature: No Abnormality Dry / Scaly: No Maceration: No Electronic Signature(s) Signed: 03/26/2023 5:34:23 PM By: [name] Schwalbe RN [name], [name] ([id number]) [id number] Page 7 of 7 Signed: 03/26/2023 5:34:23 PM By: [name] Schwalbe RN Entered By: [name] on 03/26/2023 10:01:44 -------------------------------------------------------------------------------- Vitals Details Patient Name: Date of Service: [name], [name] 03/26/2023 9:30 A M Medical Record Number: [id number] Patient Account Number: [id number] Date of Birth/Sex: Treating RN: 1966-01-11 (57 y.o. [name] Primary Care Tamberlyn Midgley: [name] Other Clinician: Referring Loda Bialas: Treating Dylan Monforte/Extender: [name] in Treatment: 40 Vital Signs Time Taken: 09:34 Temperature (F): 97.7 Height (in): 65 Pulse (bpm): 83 Weight (lbs): 331 Respiratory Rate (breaths/min): 18 Body Mass Index (BMI): 55.1 Blood Pressure (mmHg): 123/87 Reference Range: 80 - 120 mg / dl Electronic Signature(s) Signed: 03/26/2023 5:34:23 PM By: [name] Schwalbe RN Entered By: [name] on 03/26/2023 09:34:31

## 2023-04-02 ENCOUNTER — Ambulatory Visit (HOSPITAL_BASED_OUTPATIENT_CLINIC_OR_DEPARTMENT_OTHER): Payer: No Typology Code available for payment source | Admitting: Physician Assistant

## 2023-04-09 ENCOUNTER — Encounter (HOSPITAL_BASED_OUTPATIENT_CLINIC_OR_DEPARTMENT_OTHER): Payer: No Typology Code available for payment source | Attending: Physician Assistant | Admitting: Physician Assistant

## 2023-04-09 DIAGNOSIS — L97412 Non-pressure chronic ulcer of right heel and midfoot with fat layer exposed: Secondary | ICD-10-CM | POA: Diagnosis not present

## 2023-04-09 DIAGNOSIS — E11621 Type 2 diabetes mellitus with foot ulcer: Secondary | ICD-10-CM | POA: Insufficient documentation

## 2023-04-09 DIAGNOSIS — L97812 Non-pressure chronic ulcer of other part of right lower leg with fat layer exposed: Secondary | ICD-10-CM | POA: Insufficient documentation

## 2023-04-09 DIAGNOSIS — L97512 Non-pressure chronic ulcer of other part of right foot with fat layer exposed: Secondary | ICD-10-CM | POA: Diagnosis not present

## 2023-04-09 DIAGNOSIS — M86671 Other chronic osteomyelitis, right ankle and foot: Secondary | ICD-10-CM | POA: Insufficient documentation

## 2023-04-09 DIAGNOSIS — Z89429 Acquired absence of other toe(s), unspecified side: Secondary | ICD-10-CM | POA: Diagnosis not present

## 2023-04-09 DIAGNOSIS — I890 Lymphedema, not elsewhere classified: Secondary | ICD-10-CM | POA: Insufficient documentation

## 2023-04-09 DIAGNOSIS — I10 Essential (primary) hypertension: Secondary | ICD-10-CM | POA: Insufficient documentation

## 2023-04-09 DIAGNOSIS — K219 Gastro-esophageal reflux disease without esophagitis: Secondary | ICD-10-CM | POA: Insufficient documentation

## 2023-04-09 NOTE — Progress Notes (Addendum)
Paver, [name] ([id number]) [id number] Page 1 of 11 Visit Report for 04/09/2023 Chief Complaint Document Details Patient Name: Date of Service: [name], [name] 04/09/2023 9:30 A M Medical Record Number: [id number] Patient Account Number: [id number] Date of Birth/Sex: Treating RN: 1966/10/06 (57 y.o. F) Primary Care Provider: [name] Other Clinician: Referring Provider: Treating Provider/Extender: [name] in Treatment: 70 Information Obtained from: Patient Chief Complaint Right heel ulcer Electronic Signature(s) Signed: 04/09/2023 9:56:27 AM By: Allen Derry PA-C Entered By: Allen Derry on 04/09/2023 09:56:27 -------------------------------------------------------------------------------- Debridement Details Patient Name: Date of Service: [name], [name] 04/09/2023 9:30 A M Medical Record Number: [id number] Patient Account Number: [id number] Date of Birth/Sex: Treating RN: November 27, 1965 (57 y.o. F) Primary Care Provider: [name] Other Clinician: Referring Provider: Treating Provider/Extender: [name] in Treatment: 43 Debridement Performed for Assessment: Wound #1 Right Calcaneus Performed By: Physician Lenda Kelp, PA Debridement Type: Debridement Severity of Tissue Pre Debridement: Fat layer exposed Level of Consciousness (Pre-procedure): Awake and Alert Pre-procedure Verification/Time Out Yes - 10:08 Taken: Start Time: 10:08 Pain Control: Lidocaine 5% topical ointment Percent of Wound Bed Debrided: 100% T Area Debrided (cm): otal 0.57 Tissue and other material debrided: Viable, Non-Viable, Callus, Subcutaneous Level: Skin/Subcutaneous Tissue Debridement Description: Excisional Instrument: Curette Bleeding: Minimum Hemostasis Achieved: Pressure End Time: 10:10 Procedural Pain: 0 Post Procedural Pain: 0 Response to Treatment: Procedure was tolerated well Level of Consciousness (Post- Awake  and Alert procedure): Post Debridement Measurements of Total Wound Length: (cm) 0.9 Width: (cm) 0.8 Depth: (cm) 0.4 Volume: (cm) 0.226 Character of Wound/Ulcer Post Debridement: Improved [name], [name] ([id number]) [id number] Page 2 of 11 Severity of Tissue Post Debridement: Fat layer exposed Post Procedure Diagnosis Same as Pre-procedure Notes Scribed for Allen Derry III PA-C, by [employer]) Signed: 04/09/2023 3:02:03 PM By: Allen Derry PA-C Entered By: Allen Derry on 04/09/2023 15:02:03 -------------------------------------------------------------------------------- HPI Details Patient Name: Date of Service: [name], [name] 04/09/2023 9:30 A M Medical Record Number: [id number] Patient Account Number: [id number] Date of Birth/Sex: Treating RN: December 19, 1965 (57 y.o. F) Primary Care Provider: [name] Other Clinician: Referring Provider: Treating Provider/Extender: [name] in Treatment: 72 History of Present Illness HPI Description: 06-13-2022 upon evaluation today patient presents for evaluation of her right heel ulcer. She is having a tremendous amount of pain at this location. She tells me that her most recent hemoglobin A1c was 8.7 and that was on 08-23-2021. This ulcer on the heel has been present she states since February 2023 when she had an injection to the heel by podiatry. She states that she began to have increasing pain the wound opened and has been open ever since. 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 She had an x-ray in June if anything can although there did not appear to be any obvious evidence of osteomyelitis at that point. She subsequently has had OR debridement several times of the wound performed by podiatry. Patient does have a history of lymphedema of the lower extremities she is also a type II diabetic. 06-19-2022 upon evaluation today patient appears to be  doing better in regard to the size of her leg which is significantly improved. With that being said she had a lot of drainage from the heel which is completely understandable considering what is going on at this time. There does not appear to be any evidence of active infection there is no warmth or irritation to the leg in general. With that being  said the patient does have an issue here with the amount of drainage that is going on I definitely think Zetuvit is good to be the better way to go in place of ABD pads. Also think that she could potentially benefit from 3 times per week dressing changes but again right now we are going to stick with the to change it today, change in her Friday, and then subsequently seeing where things stand next Wednesday. The other option would be to change her to a different day for wound care having her come then say like a Tuesday Friday or Monday Thursday. 06-26-2022 upon evaluation patient's wound bed actually showed signs of doing well in regard to the overall size it was measuring slightly smaller. With that being said unfortunately the biggest issue we see is she still has a tremendous amount of drainage which is what could keep this from being able to use a total contact cast. For that reason I am did going to discuss with her today the possibility of trying Tubigrip to see if that could be of benefit for her. She voiced understanding and is in agreement with giving this a try. 07-03-2022 upon evaluation today patient unfortunately appears to be doing significantly worse at this point in regard to her swelling due to the fact that she was unable to really wear the Tubigrip. She tells me that it was cutting into her. With that being said she also did have an MRI this MRI revealed that she does have osteomyelitis in the heel this was actually just performed Monday, 25 September. This does show signs of "early osteomyelitis". Nonetheless this is still concerning to  me. The wound also appears to be getting deeper in the center aspect of this which has me a little concerned as well. I do believe that she is likely going require an aggressive approach here to try to get things better. I discussed that in greater detail with her today. I will detailed in the plan. 07-17-2022 upon evaluation today patient appears to be doing poorly in regard to her wound. Unfortunately she does not show any signs of infection systemically though locally this seems to be doing worse with the wound actually being bigger than where it was previous. She did have an MRI that showed evidence of osteomyelitis actually recommended a referral to ID she was evaluated infectious disease and they recommended referral to podiatry and to be honest have recommended amputation based on what the patient tells me today. With that being said this is something that she is not interested in at all to be perfectly honest. For that reason she wants to know what she should do and where she should go at this point that is where the majority of the conversation which was quite lengthy today went. Obviously understand her concern here but I think she is going to have to definitely get off this and likely this means she is going to need to come out of work. 10/18; Since the patient was the patient is using Hydrofera Blue on her right heel wound. She has a new larger open area superior to the wound. The skin on the bottom of her foot is completely macerated. I reviewed the infectious disease note on this patient from 10/9. They recommended keeping the patient on Delofloxacin 450 twice daily until follow-up tomorrow. The patient states she is not going back there as the only thing they wanted to do was "cut my leg off]. She has not seen  podiatry. Her lab works shows a CRP markedly elevated at 240.2 a sedimentation rate of 96 the rest of her CBC is normal creatinine of 0.38 The patient has been to see her primary  doctor who is Dr. Leonides Schanz [SP[facility]] In [place]. According the patient Dr. Loralee Pacas has ordered vancomycin and Zosyn to start on November 1. She is not taking the delofloxacin that was suggested by infectious disease. Very angry that they just wanted to consider her for an amputation although I do not specifically see that stated in their note 07-31-2022 upon evaluation today patient still has a significant wound over her plantar aspect of her right l foot region. With that being said I am definitely concerned about the fact that the patient probably does need to be in hyperbaric oxygen therapy at this point. I think we should try to get this going as quickly as possible. She actually is going to be set up with a PICC line next Wednesday and then following this we will have the vancomycin and Zosyn that she will Dow[place], Octa ([id number]) [phone].pdf Page 3 of 11 be taking for 8 weeks according to what she tells me. I think this is definitely going to be beneficial and again the goal here is limb salvage. In combination with that I think the hyperbaric oxygen therapy would be ultimately significantly helpful for her. 08-07-2022 upon evaluation today patient appears to still be doing quite poorly overall she still has not gotten her antibiotics. She got her PICC line today which I assumed she will be getting the first dose of antibiotics as well during that time. With that being said unfortunately she did not get the antibiotics and in fact as I questioned her further she does not even know when or if that is going to be started this week. Again I am very concerned about this considering she already has a PICC line in place we do not want this to clot off on top of the fact that she should already be on these antibiotics she just never went to the ER for evaluation therefore they never got this started. At this point based on what I am seeing I really think that the ideal thing would be for  [place] to see about getting her to the ER for further evaluation and treatment to see if they can get this started for her as soon as possible. The patient voiced understanding and is in agreement with the plan. I gave her recommendations for what should be done also called [facility] infusion therapy in order to see if they would be the ones that seem to be available for her IV infusions but they tell me that they no longer do this that is apparently where the orders were sent to for her infusion therapy. That was by her primary care provider. 08-21-2022 upon evaluation today patient's wound on the bottom of the heel and foot area as well as the toe appear to be doing significantly better. Fortunately I do not see any evidence of infection locally or systemically and everything is measuring much smaller than where we have been. The wound in the left gluteal region also is dramatically improved compared to last time she was here. She has started the antibiotics this is cefepime and vancomycin. She did have a somewhat high trough and therefore they have her hold that today and they are to recheck in the morning I believe she told me. Nonetheless with the antibiotics going she seems to be doing  significantly better which is great news. 08-28-2022 upon evaluation today patient appears to be doing somewhat better in regard to her foot there is definitely less drainage than what we have seen in the past. I do believe the antibiotics are helping. With that being said she is also been approved for hyperbaric oxygen therapy and I think as soon as we get this started the better. I discussed that with her today as well. With that being said I do think that the last thing she needs is her chest x-ray when she has that done will be ready to start her into treatment. She is going to try to go get that today when she leaves which I think would be ideal. 09-11-2022 upon evaluation today patient appears to be doing well with  regard to her foot ulcer. This is showing signs of no digression and she does not appear to be having as much drainage as before though it still very wet is not nearly the extreme of what it was previous. Fortunately there does not appear to be any signs of active infection locally nor systemically at this point which is great news. Patient did have her chest x-ray as well as EKG and she is actually approved and completely cleared for HBO therapy from both a clinical standpoint as well as an insurance standpoint. 09-25-2022 upon evaluation today patient appears to be doing well in regard to her foot ulcer. Unfortunately she is not doing as well when it comes to her hyperbaric treatments. She is having a lot of trouble with claustrophobia. She tells me in general that she is not sure she is good to be able to continue. 10-09-2022 upon evaluation today patient's wounds actually are showing signs of excellent improvement which is great news. Fortunately I do not see any evidence of infection locally or systemically which is great news and overall I am extremely pleased with where things are currently. I do think that she is making good progress she does tell me at this point today she does not want to go forward with a hyperbaric oxygen therapy. 10-16-2022 upon evaluation today patient appears to be doing excellent in regard to her foot ulcer. This is actually showing signs of excellent improvement. She still does not allow for any sharp debridement but the good news is this is doing much better and does not even need it at the moment. Nonetheless as far as sharp debridement is concerned this has been too painful for really not been able to do that all along. 10-23-2022 upon evaluation today patient actually is making excellent progress. I am extremely pleased with where we stand I have considered even seeing about putting her in the total contact cast but to be honest she is doing so well currently and still  with having some drainage I feel like it is better for her to be able to change it then to be locked up in the cast for a week at a time. She has voiced understanding as well. Overall though I feel like you are making some really good progress here. 10-30-2022 upon evaluation today patient appears to be doing excellent in regard to her foot ulcers. Both are showing signs of improvement which is great news and overall I feel like that they are measuring smaller, looking better, and I see no signs of resurgence of the infection which is great news. Overall I am extremely pleased at this point. 2/7; the patient has 2 wounds which are remanence of  the large wound on the right foot. This was a diabetic wound with underlying osteomyelitis she has completed antibiotics. Today she has the open area on the distal calcaneus and an area on the right midfoot which is eschared. She has been using silver alginate and offloading this in a regular running shoe. 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 11-27-2022 upon evaluation today patient appears to be doing well currently in regard to her wound which is actually showing signs of significant improvement this is great news. Fortunately there does not appear to be any signs of active infection locally nor systemically which is also excellent. No fevers, chills, nausea, vomiting, or diarrhea. 12-25-2022 upon evaluation today patient appears to be doing well currently in regard to the distal portion of her foot which is actually in my opinion looking a little better but have a lot of callus she is going require some debridement here. With that being said the heel actually looks like it might be a little bit worse. There is a lot of callus I cannot tell exactly what is open or not we can remove that and we will see what exactly were dealing with there. With that being said she also has an area on the  right side of her leg which appears to be more of a pressure injury she tells me this is from sitting in her recliner she has gotten a pillow to help take care of this. Fortunately it is not technically open and draining at this point made it very well may be before it is also not done. 01-01-2023 upon evaluation today patient appears to be doing well currently in regard to her wounds I feel like the cefdinir has helped she seems to be improved compared to last week's evaluation. I do not see any signs of active infection locally nor systemically at this time which is great news. No fevers, chills, nausea, vomiting, or diarrhea. 01-08-2023 upon evaluation today patient's wounds on the foot appear to be doing decently well the leg is still about the same. With that being said unfortunately she does have a lot of erythema on the leg in particular which is unfortunate and definitely not what we are looking for. Again I do not think that the [facility] has really done well for her in that regard. 01-15-2023 upon evaluation today patient appears to be doing well currently in regard to her wound. Overall I think that things are measuring a little bit better but unfortunately the erythema is spreading. I did review her culture today. Unfortunately it does show that she does have MRSA noted and again I think that we need to do something to try to get this under control as quickly as possible. She has previously been on IV antibiotics and unfortunately that has not been sufficient to get this completely cleared it seems to have come back at this point and she really wants to avoid going back on IV antibiotics. We have been using oral antibiotics and various forms the most recent was actually a course of cefdinir. She is also previously been on clindamycin and even in the past she has had to doxycycline. With that being said with all things considered I think we are still having a lot of trouble here keeping this  infection under control. We previously attempted hyperbarics but the patient did not tolerate this well. She therefore discontinued HBO therapy. 01-22-2023 upon evaluation  today patient appears to be doing a little better in regard to the cellulitis on her right leg. I am much more pleased with what I am seeing today. With that being said unfortunately she is still having quite a bit of issues here with erythema though not as bad I think that were not completely clear. Unfortunately we were denied for the [facility] prescription. She has the medication but I provided for her by way of samples but nothing further. 01-29-2023 upon evaluation today patient appears to be doing poorly currently in regard to her leg. I do not feel like she is showing as much improvement as she was within desirable now that we switch her to linezolid. With that being said I do believe that we probably need to see about IV antibiotics I recommended a referral back to ID. With that being said she would prefer to see her physician which I am okay with but either way I think she needs to be back on IV antibiotics for a minimum of 2 months as soon as possible. I am going to send her for lab work today in order to get some baseline labs but honestly I think that we are going to require IV antibiotics to get this under control. 02-12-2023 upon evaluation today patient appears to be doing well currently in regard to her wound all things considered. She has not gotten the lab work done yet that we ordered a couple weeks back. She tells me she has had a lot going on with the death of the family. Fortunately there does not appear to be any signs Lapaglia, [name] ([id number]) [phone].pdf Page 4 of 11 of worsening in fact some of the heel looks a little bit better which is great news. Fortunately I do not see any evidence of active infection locally nor systemically which is great news. 02-19-24 upon evaluation today  patient appears to be doing well currently in regard to her wounds. She has been tolerating the dressing changes without complication. Fortunately there does not appear to be any signs of active infection locally nor systemically which is great news. No fevers, chills, nausea, vomiting, or diarrhea. She is currently on vancomycin and Zosyn through IV which is doing I think well she has been on it for not quite a week at this point. 03-05-2023 upon evaluation today patient appears to be doing well currently in regard to her wound. She has been tolerating the dressing changes all 3 locations actually are significantly smaller even compared to last week I am very pleased with where we stand. I think the antibiotics are really doing a good job here for her. 03-12-2023 upon evaluation today patient has actually continued to make excellent progress in regard to her wounds. I am very pleased with where we stand in fact I think her leg is healed the middle part of her foot I think is close to being closed and the heel does look better though there is some callus that needs to be removed at this point. I did discuss that with her today. 03-19-2023 upon evaluation today patient's wounds are actually showing signs of excellent improvement. I am actually very pleased with where we stand and I think that she is making really good progress here. I do not see any signs of active infection locally nor systemically which is great news. 03-26-2023 upon evaluation today patient's wounds actually are showing signs of improvement in fact the middle of her foot is pretty much appearing to be  completely healed which is great news. This means we are just dealing with the heel itself on the bottom of the foot as far as anything remaining is concerned at this point. I am extremely pleased with where we stand at this time. 04-09-2023 upon evaluation today patient appears to be doing well currently in regard to the periwound. She has  been tolerating the dressing changes without complication. Fortunately there does not appear to be any signs of active infection locally or systemically at this time. Potentially seeing about getting a total contact cast on and try to get this closed as quickly as possible. I think the casting is probably our best bet at this point and the patient is in agreement with that plan. At this point based on what I am seeing I do believe that the patient would benefit from a continuation of care with regard to dressing changes regularly. Electronic Signature(s) Signed: 04/09/2023 10:59:36 AM By: Allen Derry PA-C Entered By: Allen Derry on 04/09/2023 10:59:35 -------------------------------------------------------------------------------- Physical Exam Details Patient Name: Date of Service: [name], [name] 04/09/2023 9:30 A M Medical Record Number: [id number] Patient Account Number: [id number] Date of Birth/Sex: Treating RN: 15-Apr-1966 (57 y.o. F) Primary Care Provider: [name] Other Clinician: Referring Provider: Treating Provider/Extender: [name] in Treatment: 80 Constitutional Obese and well-hydrated in no acute distress. Respiratory normal breathing without difficulty. Psychiatric this patient is able to make decisions and demonstrates good insight into disease process. Alert and Oriented x 3. pleasant and cooperative. Notes Upon inspection patient's wound bed actually showed signs of good granulation epithelization at this point. Overall I do feel like she is making good progress towards complete closure. With that being said I do think that she is overall still in need of sharp debridement today to clearway some of the callus I did go and perform debridement at this point and postdebridement this looks better and still appears to be clean I would recommend switching to the [facility] to see how things do over the next week I think getting started with total  contact cast would also be the ideal way to go here. She is in agreement with that plan. Electronic Signature(s) Signed: 04/09/2023 11:00:20 AM By: Allen Derry PA-C Entered By: Allen Derry on 04/09/2023 11:00:20 -------------------------------------------------------------------------------- Physician Orders Details Patient Name: Date of Service: [name], [name] 04/09/2023 9:30 A Hyman Bower, [name] ([id number]) [id number] Page 5 of 11 Medical Record Number: [id number] Patient Account Number: [id number] Date of Birth/Sex: Treating RN: August 03, 1966 (57 y.o. [name] Primary Care Provider: [name] Other Clinician: Referring Provider: Treating Provider/Extender: [name] in Treatment: 89 Verbal / Phone Orders: No Diagnosis Coding ICD-10 Coding Code Description [phone] Other chronic osteomyelitis, right ankle and foot E11.621 Type 2 diabetes mellitus with foot ulcer L97.412 Non-pressure chronic ulcer of right heel and midfoot with fat layer exposed I89.0 Lymphedema, not elsewhere classified L97.812 Non-pressure chronic ulcer of other part of right lower leg with fat layer exposed Follow-up Appointments ppointment in 1 week. Allen Derry III PA-C Room 9 Wednesday Return A ppointment in: - ++ return 04/18/23 at 10:30am for T Contact Cast Change. otal Return A Other: - PICC line 02/13/2023 with [facility]. Anesthetic (In clinic) Topical Lidocaine 5% applied to wound bed Bathing/ Shower/ Hygiene May shower with protection but do not get wound dressing(s) wet. Protect dressing(s) with water repellant cover (for example, large plastic bag) or a cast cover and may then take shower. - ++ When  wearing the T Contact Cast #4 , please sponge bathe or use a Cast Protector to otal keep the Right leg dry while bathing/showering. Cast protectors costs between $19-$40, can purchase from medical supply store,[facility], [facility] etc. Edema Control -  Lymphedema / SCD / Other Elevate legs to the level of the heart or above for 30 minutes daily and/or when sitting for 3-4 times a day throughout the day. Avoid standing for long periods of time. Off-Loading Total Contact Cast to Right Lower Extremity - +++T entatively Next Wednesday 04/16/23 place T contact cast #4 on right leg. Then return to otal wound Clinic on Friday 04/18/23 for Wound Care visit to change T Contact Cast (TCC) otal Other: - Minimize walking and standing as much as possible to right foot to aid in offloading to wound. While at work use the wheelchair for mobility. felt in shoe to aid in offloading wound and heel. Wound Treatment Wound #1 - Calcaneus Wound Laterality: Right Cleanser: Soap and Water 1 x Per Day/30 Days Discharge Instructions: May shower and wash wound with dial antibacterial soap and water prior to dressing change. Cleanser: Wound Cleanser (Generic) 1 x Per Day/30 Days Discharge Instructions: Cleanse the wound with wound cleanser prior to applying a clean dressing using gauze sponges, not tissue or cotton balls. Peri-Wound Care: Sween Lotion (Moisturizing lotion) 1 x Per Day/30 Days Discharge Instructions: Apply moisturizing lotion as directed Prim Dressing: Hydrofera Blue Ready Transfer Foam, 2.5x2.5 (in/in) 1 x Per Day/30 Days ary Discharge Instructions: cut a small piece to place inside wound and then place Hydrafera Blue ready, a small piece on the wound. Secondary Dressing: ABD Pad, 5x9 (Generic) 1 x Per Day/30 Days Discharge Instructions: Apply over primary dressing as directed. Secondary Dressing: Woven Gauze Sponge, Non-Sterile 4x4 in (Generic) 1 x Per Day/30 Days Discharge Instructions: Apply over primary dressing as directed. Secondary Dressing: heel ortho felt 1 x Per Day/30 Days Discharge Instructions: apply in shoe to aid in offloading of heel and wound. (if not using the ABD pad) Secured With: Conforming Stretch Gauze Bandage Roll, Sterile  4x75 (in/in) (Generic) 1 x Per Day/30 Days Discharge Instructions: Secure with stretch gauze as directed. Secured With: 61M Medipore H Soft Cloth Surgical T ape, 4 x 10 (in/yd) (Generic) 1 x Per Day/30 Days Discharge Instructions: Secure with tape as directed. Electronic Signature(s) Signed: 04/09/2023 3:56:42 PM By: Allen Derry PA-C Signed: 04/09/2023 5:05:11 PM By: Karie Schwalbe RN [place], [name] ([id number]) [id number] Page 6 of 11 Signed: 04/09/2023 5:05:11 PM By: Karie Schwalbe RN Entered By: Karie Schwalbe on 04/09/2023 10:34:13 -------------------------------------------------------------------------------- Problem List Details Patient Name: Date of Service: [name], [name] 04/09/2023 9:30 A M Medical Record Number: [id number] Patient Account Number: [id number] Date of Birth/Sex: Treating RN: [birth year] (57 y.o. F) Primary Care Provider: [name] Other Clinician: Referring Provider: Treating Provider/Extender: [name] in Treatment: 33 Active Problems ICD-10 Encounter Code Description Active Date MDM Diagnosis [phone] Other chronic osteomyelitis, right ankle and foot 08/28/2022 No Yes E11.621 Type 2 diabetes mellitus with foot ulcer 06/12/2022 No Yes L97.412 Non-pressure chronic ulcer of right heel and midfoot with fat layer exposed 06/12/2022 No Yes I89.0 Lymphedema, not elsewhere classified 06/12/2022 No Yes L97.812 Non-pressure chronic ulcer of other part of right lower leg with fat layer 03/05/2023 No Yes exposed Inactive Problems Resolved Problems Electronic Signature(s) Signed: 04/09/2023 9:56:17 AM By: Allen Derry PA-C Entered By: Allen Derry on 04/09/2023 09:56:17 -------------------------------------------------------------------------------- Progress Note Details Patient Name: Date of Service: [name], [name] 04/09/2023  9:30 A M Medical Record Number: [id number] Patient Account Number: [id number] Date of Birth/Sex: Treating  RN: 04/07/1966 (57 y.o. F) Primary Care Provider: [name] Other Clinician: Referring Provider: Treating Provider/Extender: [name] in Treatment: [address] Subjective Chief Complaint Schlachter, [name] ([id number]) [id number] Page 7 of 11 Information obtained from Patient Right heel ulcer History of Present Illness (HPI) 06-13-2022 upon evaluation today patient presents for evaluation of her right heel ulcer. She is having a tremendous amount of pain at this location. She tells me that her most recent hemoglobin A1c was 8.7 and that was on 08-23-2021. This ulcer on the heel has been present she states since February 2023 when she had an injection to the heel by podiatry. She states that she began to have increasing pain the wound opened and has been open ever since. 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 She had an x-ray in June if anything can although there did not appear to be any obvious evidence of osteomyelitis at that point. She subsequently has had OR debridement several times of the wound performed by podiatry. Patient does have a history of lymphedema of the lower extremities she is also a type II diabetic. 06-19-2022 upon evaluation today patient appears to be doing better in regard to the size of her leg which is significantly improved. With that being said she had a lot of drainage from the heel which is completely understandable considering what is going on at this time. There does not appear to be any evidence of active infection there is no warmth or irritation to the leg in general. With that being said the patient does have an issue here with the amount of drainage that is going on I definitely think Zetuvit is good to be the better way to go in place of ABD pads. Also think that she could potentially benefit from 3 times per week dressing changes but again right now we are going to stick with the to  change it today, change in her Friday, and then subsequently seeing where things stand next Wednesday. The other option would be to change her to a different day for wound care having her come then say like a Tuesday Friday or Monday Thursday. 06-26-2022 upon evaluation patient's wound bed actually showed signs of doing well in regard to the overall size it was measuring slightly smaller. With that being said unfortunately the biggest issue we see is she still has a tremendous amount of drainage which is what could keep this from being able to use a total contact cast. For that reason I am did going to discuss with her today the possibility of trying Tubigrip to see if that could be of benefit for her. She voiced understanding and is in agreement with giving this a try. 07-03-2022 upon evaluation today patient unfortunately appears to be doing significantly worse at this point in regard to her swelling due to the fact that she was unable to really wear the Tubigrip. She tells me that it was cutting into her. With that being said she also did have an MRI this MRI revealed that she does have osteomyelitis in the heel this was actually just performed Monday, 25 September. This does show signs of "early osteomyelitis". Nonetheless this is still concerning to me. The wound also appears to be getting deeper in the center aspect of this which has me a little concerned as well. I do believe that she is likely going  require an aggressive approach here to try to get things better. I discussed that in greater detail with her today. I will detailed in the plan. 07-17-2022 upon evaluation today patient appears to be doing poorly in regard to her wound. Unfortunately she does not show any signs of infection systemically though locally this seems to be doing worse with the wound actually being bigger than where it was previous. She did have an MRI that showed evidence of osteomyelitis actually recommended a referral to  ID she was evaluated infectious disease and they recommended referral to podiatry and to be honest have recommended amputation based on what the patient tells me today. With that being said this is something that she is not interested in at all to be perfectly honest. For that reason she wants to know what she should do and where she should go at this point that is where the majority of the conversation which was quite lengthy today went. Obviously understand her concern here but I think she is going to have to definitely get off this and likely this means she is going to need to come out of work. 10/18; Since the patient was the patient is using Hydrofera Blue on her right heel wound. She has a new larger open area superior to the wound. The skin on the bottom of her foot is completely macerated. I reviewed the infectious disease note on this patient from 10/9. They recommended keeping the patient on Delofloxacin 450 twice daily until follow-up tomorrow. The patient states she is not going back there as the only thing they wanted to do was "cut my leg off]. She has not seen podiatry. Her lab works shows a CRP markedly elevated at 240.2 a sedimentation rate of 96 the rest of her CBC is normal creatinine of 0.38 The patient has been to see her primary doctor who is Dr. Leonides Schanz [SP[facility]] In [place]. According the patient Dr. Loralee Pacas has ordered vancomycin and Zosyn to start on November 1. She is not taking the delofloxacin that was suggested by infectious disease. Very angry that they just wanted to consider her for an amputation although I do not specifically see that stated in their note 07-31-2022 upon evaluation today patient still has a significant wound over her plantar aspect of her right l foot region. With that being said I am definitely concerned about the fact that the patient probably does need to be in hyperbaric oxygen therapy at this point. I think we should try to get this going as quickly as  possible. She actually is going to be set up with a PICC line next Wednesday and then following this we will have the vancomycin and Zosyn that she will be taking for 8 weeks according to what she tells me. I think this is definitely going to be beneficial and again the goal here is limb salvage. In combination with that I think the hyperbaric oxygen therapy would be ultimately significantly helpful for her. 08-07-2022 upon evaluation today patient appears to still be doing quite poorly overall she still has not gotten her antibiotics. She got her PICC line today which I assumed she will be getting the first dose of antibiotics as well during that time. With that being said unfortunately she did not get the antibiotics and in fact as I questioned her further she does not even know when or if that is going to be started this week. Again I am very concerned about this considering she already has a  PICC line in place we do not want this to clot off on top of the fact that she should already be on these antibiotics she just never went to the ER for evaluation therefore they never got this started. At this point based on what I am seeing I really think that the ideal thing would be for [place] to see about getting her to the ER for further evaluation and treatment to see if they can get this started for her as soon as possible. The patient voiced understanding and is in agreement with the plan. I gave her recommendations for what should be done also called [facility] infusion therapy in order to see if they would be the ones that seem to be available for her IV infusions but they tell me that they no longer do this that is apparently where the orders were sent to for her infusion therapy. That was by her primary care provider. 08-21-2022 upon evaluation today patient's wound on the bottom of the heel and foot area as well as the toe appear to be doing significantly better. Fortunately I do not see any evidence of  infection locally or systemically and everything is measuring much smaller than where we have been. The wound in the left gluteal region also is dramatically improved compared to last time she was here. She has started the antibiotics this is cefepime and vancomycin. She did have a somewhat high trough and therefore they have her hold that today and they are to recheck in the morning I believe she told me. Nonetheless with the antibiotics going she seems to be doing significantly better which is great news. 08-28-2022 upon evaluation today patient appears to be doing somewhat better in regard to her foot there is definitely less drainage than what we have seen in the past. I do believe the antibiotics are helping. With that being said she is also been approved for hyperbaric oxygen therapy and I think as soon as we get this started the better. I discussed that with her today as well. With that being said I do think that the last thing she needs is her chest x-ray when she has that done will be ready to start her into treatment. She is going to try to go get that today when she leaves which I think would be ideal. 09-11-2022 upon evaluation today patient appears to be doing well with regard to her foot ulcer. This is showing signs of no digression and she does not appear to be having as much drainage as before though it still very wet is not nearly the extreme of what it was previous. Fortunately there does not appear to be any signs of active infection locally nor systemically at this point which is great news. Patient did have her chest x-ray as well as EKG and she is actually approved and completely cleared for HBO therapy from both a clinical standpoint as well as an insurance standpoint. 09-25-2022 upon evaluation today patient appears to be doing well in regard to her foot ulcer. Unfortunately she is not doing as well when it comes to her hyperbaric treatments. She is having a lot of trouble with  claustrophobia. She tells me in general that she is not sure she is good to be able to continue. 10-09-2022 upon evaluation today patient's wounds actually are showing signs of excellent improvement which is great news. Fortunately I do not see any evidence of infection locally or systemically which is great news and overall I  am extremely pleased with where things are currently. I do think that she is making good progress she does tell me at this point today she does not want to go forward with a hyperbaric oxygen therapy. [name], [name] ([id number]) [id number] Page 8 of 11 10-16-2022 upon evaluation today patient appears to be doing excellent in regard to her foot ulcer. This is actually showing signs of excellent improvement. She still does not allow for any sharp debridement but the good news is this is doing much better and does not even need it at the moment. Nonetheless as far as sharp debridement is concerned this has been too painful for really not been able to do that all along. 10-23-2022 upon evaluation today patient actually is making excellent progress. I am extremely pleased with where we stand I have considered even seeing about putting her in the total contact cast but to be honest she is doing so well currently and still with having some drainage I feel like it is better for her to be able to change it then to be locked up in the cast for a week at a time. She has voiced understanding as well. Overall though I feel like you are making some really good progress here. 10-30-2022 upon evaluation today patient appears to be doing excellent in regard to her foot ulcers. Both are showing signs of improvement which is great news and overall I feel like that they are measuring smaller, looking better, and I see no signs of resurgence of the infection which is great news. Overall I am extremely pleased at this point. 2/7; the patient has 2 wounds which are remanence of  the large wound on the right foot. This was a diabetic wound with underlying osteomyelitis she has completed antibiotics. Today she has the open area on the distal calcaneus and an area on the right midfoot which is eschared. She has been using silver alginate and offloading this in a regular running shoe. 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 11-27-2022 upon evaluation today patient appears to be doing well currently in regard to her wound which is actually showing signs of significant improvement this is great news. Fortunately there does not appear to be any signs of active infection locally nor systemically which is also excellent. No fevers, chills, nausea, vomiting, or diarrhea. 12-25-2022 upon evaluation today patient appears to be doing well currently in regard to the distal portion of her foot which is actually in my opinion looking a little better but have a lot of callus she is going require some debridement here. With that being said the heel actually looks like it might be a little bit worse. There is a lot of callus I cannot tell exactly what is open or not we can remove that and we will see what exactly were dealing with there. With that being said she also has an area on the right side of her leg which appears to be more of a pressure injury she tells me this is from sitting in her recliner she has gotten a pillow to help take care of this. Fortunately it is not technically open and draining at this point made it very well may be before it is also not done. 01-01-2023 upon evaluation today patient appears to be doing well currently in regard to her wounds I feel like the cefdinir has helped she seems to be improved compared to last  week's evaluation. I do not see any signs of active infection locally nor systemically at this time which is great news. No fevers, chills, nausea, vomiting, or diarrhea. 01-08-2023 upon  evaluation today patient's wounds on the foot appear to be doing decently well the leg is still about the same. With that being said unfortunately she does have a lot of erythema on the leg in particular which is unfortunate and definitely not what we are looking for. Again I do not think that the [facility] has really done well for her in that regard. 01-15-2023 upon evaluation today patient appears to be doing well currently in regard to her wound. Overall I think that things are measuring a little bit better but unfortunately the erythema is spreading. I did review her culture today. Unfortunately it does show that she does have MRSA noted and again I think that we need to do something to try to get this under control as quickly as possible. She has previously been on IV antibiotics and unfortunately that has not been sufficient to get this completely cleared it seems to have come back at this point and she really wants to avoid going back on IV antibiotics. We have been using oral antibiotics and various forms the most recent was actually a course of cefdinir. She is also previously been on clindamycin and even in the past she has had to doxycycline. With that being said with all things considered I think we are still having a lot of trouble here keeping this infection under control. We previously attempted hyperbarics but the patient did not tolerate this well. She therefore discontinued HBO therapy. 01-22-2023 upon evaluation today patient appears to be doing a little better in regard to the cellulitis on her right leg. I am much more pleased with what I am seeing today. With that being said unfortunately she is still having quite a bit of issues here with erythema though not as bad I think that were not completely clear. Unfortunately we were denied for the [facility] prescription. She has the medication but I provided for her by way of samples but nothing further. 01-29-2023 upon evaluation today patient  appears to be doing poorly currently in regard to her leg. I do not feel like she is showing as much improvement as she was within desirable now that we switch her to linezolid. With that being said I do believe that we probably need to see about IV antibiotics I recommended a referral back to ID. With that being said she would prefer to see her physician which I am okay with but either way I think she needs to be back on IV antibiotics for a minimum of 2 months as soon as possible. I am going to send her for lab work today in order to get some baseline labs but honestly I think that we are going to require IV antibiotics to get this under control. 02-12-2023 upon evaluation today patient appears to be doing well currently in regard to her wound all things considered. She has not gotten the lab work done yet that we ordered a couple weeks back. She tells me she has had a lot going on with the death of the family. Fortunately there does not appear to be any signs of worsening in fact some of the heel looks a little bit better which is great news. Fortunately I do not see any evidence of active infection locally nor systemically which is great news. 02-19-24 upon evaluation today patient  appears to be doing well currently in regard to her wounds. She has been tolerating the dressing changes without complication. Fortunately there does not appear to be any signs of active infection locally nor systemically which is great news. No fevers, chills, nausea, vomiting, or diarrhea. She is currently on vancomycin and Zosyn through IV which is doing I think well she has been on it for not quite a week at this point. 03-05-2023 upon evaluation today patient appears to be doing well currently in regard to her wound. She has been tolerating the dressing changes all 3 locations actually are significantly smaller even compared to last week I am very pleased with where we stand. I think the antibiotics are really doing a  good job here for her. 03-12-2023 upon evaluation today patient has actually continued to make excellent progress in regard to her wounds. I am very pleased with where we stand in fact I think her leg is healed the middle part of her foot I think is close to being closed and the heel does look better though there is some callus that needs to be removed at this point. I did discuss that with her today. 03-19-2023 upon evaluation today patient's wounds are actually showing signs of excellent improvement. I am actually very pleased with where we stand and I think that she is making really good progress here. I do not see any signs of active infection locally nor systemically which is great news. 03-26-2023 upon evaluation today patient's wounds actually are showing signs of improvement in fact the middle of her foot is pretty much appearing to be completely healed which is great news. This means we are just dealing with the heel itself on the bottom of the foot as far as anything remaining is concerned at this point. I am extremely pleased with where we stand at this time. 04-09-2023 upon evaluation today patient appears to be doing well currently in regard to the periwound. She has been tolerating the dressing changes without complication. Fortunately there does not appear to be any signs of active infection locally or systemically at this time. Potentially seeing about getting a total contact cast on and try to get this closed as quickly as possible. I think the casting is probably our best bet at this point and the patient is in agreement with that plan. At this point based on what I am seeing I do believe that the patient would benefit from a continuation of care with regard to dressing changes regularly. Objective [name], [name] ([id number]) [id number] Page 9 of 11 Constitutional Obese and well-hydrated in no acute distress. Vitals Time Taken: 9:28 AM, Height: 65 in, Weight:  331 lbs, BMI: 55.1, Temperature: 97.8 F, Pulse: 94 bpm, Respiratory Rate: 18 breaths/min, Blood Pressure: 113/81 mmHg. Respiratory normal breathing without difficulty. Psychiatric this patient is able to make decisions and demonstrates good insight into disease process. Alert and Oriented x 3. pleasant and cooperative. General Notes: Upon inspection patient's wound bed actually showed signs of good granulation epithelization at this point. Overall I do feel like she is making good progress towards complete closure. With that being said I do think that she is overall still in need of sharp debridement today to clearway some of the callus I did go and perform debridement at this point and postdebridement this looks better and still appears to be clean I would recommend switching to the [facility] to see how things do over the next week I think getting started with  total contact cast would also be the ideal way to go here. She is in agreement with that plan. Integumentary (Hair, Skin) Wound #1 status is Open. Original cause of wound was Gradually Appeared. The date acquired was: 11/07/2020. The wound has been in treatment 43 weeks. The wound is located on the Right Calcaneus. The wound measures 0.9cm length x 0.8cm width x 0.4cm depth; 0.565cm^2 area and 0.226cm^3 volume. There is Fat Layer (Subcutaneous Tissue) exposed. There is no tunneling or undermining noted. There is a medium amount of serosanguineous drainage noted. The wound margin is thickened. There is large (67-100%) red, pink granulation within the wound bed. There is a small (1-33%) amount of necrotic tissue within the wound bed including Eschar and Adherent Slough. The periwound skin appearance exhibited: Callus, Maceration. The periwound skin appearance did not exhibit: Crepitus, Excoriation, Induration, Rash, Scarring, Dry/Scaly, Atrophie Blanche, Cyanosis, Ecchymosis, Hemosiderin Staining, Mottled, Pallor, Rubor, Erythema. Periwound  temperature was noted as No Abnormality. The periwound has tenderness on palpation. Assessment Active Problems ICD-10 Other chronic osteomyelitis, right ankle and foot Type 2 diabetes mellitus with foot ulcer Non-pressure chronic ulcer of right heel and midfoot with fat layer exposed Lymphedema, not elsewhere classified Non-pressure chronic ulcer of other part of right lower leg with fat layer exposed Procedures Wound #1 Pre-procedure diagnosis of Wound #1 is a Diabetic Wound/Ulcer of the Lower Extremity located on the Right Calcaneus .Severity of Tissue Pre Debridement is: Fat layer exposed. There was a Excisional Skin/Subcutaneous Tissue Debridement with a total area of 0.57 sq cm performed by Lenda Kelp, PA. With the following instrument(s): Curette to remove Viable and Non-Viable tissue/material. Material removed includes Callus and Subcutaneous Tissue and after achieving pain control using Lidocaine 5% topical ointment. No specimens were taken. A time out was conducted at 10:08, prior to the start of the procedure. A Minimum amount of bleeding was controlled with Pressure. The procedure was tolerated well with a pain level of 0 throughout and a pain level of 0 following the procedure. Post Debridement Measurements: 0.9cm length x 0.8cm width x 0.4cm depth; 0.226cm^3 volume. Character of Wound/Ulcer Post Debridement is improved. Severity of Tissue Post Debridement is: Fat layer exposed. Post procedure Diagnosis Wound #1: Same as Pre-Procedure General Notes: Scribed for [employer] PA-C, by J.Scotton. Plan Follow-up Appointments: Return Appointment in 1 week. Allen Derry III PA-C Room 9 Wednesday Return Appointment in: - ++ return 04/18/23 at 10:30am for T Contact Cast Change. otal Other: - PICC line 02/13/2023 with [facility]. Anesthetic: (In clinic) Topical Lidocaine 5% applied to wound bed Bathing/ Shower/ Hygiene: May shower with protection but do not get wound dressing(s) wet.  Protect dressing(s) with water repellant cover (for example, large plastic bag) or a cast cover and may then take shower. - ++ When wearing the T Contact Cast #4 , please sponge bathe or use a Cast Protector to keep the Right leg dry while otal bathing/showering. Cast protectors costs between $19-$40, can purchase from medical supply store,[facility], [facility],[employer] etc. Edema Control - Lymphedema / SCD / Other: Elevate legs to the level of the heart or above for 30 minutes daily and/or when sitting for 3-4 times a day throughout the day. Avoid standing for long periods of time. Off-Loading: T Contact Cast to Right Lower Extremity - +++T otal entatively Next Wednesday 04/16/23 place T contact cast #4 on right leg. Then return to wound Clinic on otal Friday 04/18/23 for Wound Care visit to change T Contact Cast (TCC) otal  Other: - Minimize walking and standing as much as possible to right foot to aid in offloading to wound. While at work use the wheelchair for mobility. felt in shoe to aid in offloading wound and heel. WOUND #1: - Calcaneus Wound Laterality: Right Cleanser: Soap and Water 1 x Per Day/30 Days [name], [name] ([id number]) [id number] Page 10 of 11 Discharge Instructions: May shower and wash wound with dial antibacterial soap and water prior to dressing change. Cleanser: Wound Cleanser (Generic) 1 x Per Day/30 Days Discharge Instructions: Cleanse the wound with wound cleanser prior to applying a clean dressing using gauze sponges, not tissue or cotton balls. Peri-Wound Care: Sween Lotion (Moisturizing lotion) 1 x Per Day/30 Days Discharge Instructions: Apply moisturizing lotion as directed Prim Dressing: Hydrofera Blue Ready Transfer Foam, 2.5x2.5 (in/in) 1 x Per Day/30 Days ary Discharge Instructions: cut a small piece to place inside wound and then place Hydrafera Blue ready, a small piece on the wound. Secondary Dressing: ABD Pad, 5x9 (Generic) 1 x Per  Day/30 Days Discharge Instructions: Apply over primary dressing as directed. Secondary Dressing: Woven Gauze Sponge, Non-Sterile 4x4 in (Generic) 1 x Per Day/30 Days Discharge Instructions: Apply over primary dressing as directed. Secondary Dressing: heel ortho felt 1 x Per Day/30 Days Discharge Instructions: apply in shoe to aid in offloading of heel and wound. (if not using the ABD pad) Secured With: Conforming Stretch Gauze Bandage Roll, Sterile 4x75 (in/in) (Generic) 1 x Per Day/30 Days Discharge Instructions: Secure with stretch gauze as directed. Secured With: 11M Medipore H Soft Cloth Surgical T ape, 4 x 10 (in/yd) (Generic) 1 x Per Day/30 Days Discharge Instructions: Secure with tape as directed. 1. I would recommend currently that we have the patient continue to monitor for any signs of infection or worsening. Based on what I am seeing I do believe that she is making good headway towards closure. 2. With regard to her wound after debridement today this looks better I am going to recommend that we actually have the patient continue to monitor for any evidence of infection or worsening. Subsequently I do want to go ahead and see about getting a total contact cast to try to get this healed. She is in agreement with the plan. We will see patient back for reevaluation in 1 week here in the clinic. If anything worsens or changes patient will contact our office for additional recommendations. Electronic Signature(s) Signed: 04/09/2023 3:02:16 PM By: Allen Derry PA-C Previous Signature: 04/09/2023 2:56:00 PM Version By: Allen Derry PA-C Entered By: Allen Derry on 04/09/2023 15:02:16 -------------------------------------------------------------------------------- SuperBill Details Patient Name: Date of Service: TEREKA, LAAKE 04/09/2023 Medical Record Number: [id number] Patient Account Number: [id number] Date of Birth/Sex: Treating RN: [birth year] (57 y.o. F) Primary Care Provider: [name] Other  Clinician: Referring Provider: Treating Provider/Extender: [name] in Treatment: 9 Diagnosis Coding ICD-10 Codes Code Description [id number] Other chronic osteomyelitis, right ankle and foot E11.621 Type 2 diabetes mellitus with foot ulcer L97.412 Non-pressure chronic ulcer of right heel and midfoot with fat layer exposed I89.0 Lymphedema, not elsewhere classified L97.812 Non-pressure chronic ulcer of other part of right lower leg with fat layer exposed Facility Procedures : CPT4 Code: 66440347 Description: 11042 - DEB SUBQ TISSUE 20 SQ CM/< ICD-10 Diagnosis Description L97.412 Non-pressure chronic ulcer of right heel and midfoot with fat layer exposed Modifier: Quantity: 1 Physician Procedures : CPT4 Code Description Modifier [id number] 11042 - WC PHYS SUBQ TISS 20 SQ CM ICD-10 Diagnosis Description L97.412 Non-pressure chronic ulcer  of right heel and midfoot with fat layer exposed Fuhr, Sadeel ([id number]) [id number]  Page 11 of 1 Quantity: 1 1 Electronic Signature(s) Signed: 04/09/2023 3:02:26 PM By: Allen Derry PA-C Entered By: Allen Derry on 04/09/2023 15:02:25

## 2023-04-09 NOTE — Progress Notes (Signed)
[name] ([id number]) [id number] Page 1 of 6 Visit Report for 04/09/2023 Arrival Information Details Patient Name: Date of Service: [name], [name] 04/09/2023 9:30 A M Medical Record Number: [id number] Patient Account Number: [id number] Date of Birth/Sex: Treating RN: Oct 22, 1965 (57 y.o. F) Primary Care Khaleah Duer: [name] Other Clinician: Referring Ariyanah Aguado: Treating Merry [place]ond/Extender: [name] in Treatment: 27 Visit Information History Since Last Visit Added or deleted any medications: No Patient Arrived: Ambulatory Any new allergies or adverse reactions: No Arrival Time: 09:23 Had a fall or experienced change in No Accompanied By: son activities of daily living that may affect Transfer Assistance: None risk of falls: Patient Identification Verified: Yes Signs or symptoms of abuse/neglect since last visito No Secondary Verification Process Completed: Yes Hospitalized since last visit: No Patient Requires Transmission-Based Precautions: No Implantable device outside of the clinic excluding No Patient Has Alerts: Yes cellular tissue based products placed in the center Patient Alerts: PICC in left arm since last visit: Has Dressing in Place as Prescribed: Yes Pain Present Now: Yes Electronic Signature(s) Signed: 04/09/2023 4:52:44 PM By: Thayer Dallas Entered By: Thayer Dallas on 04/09/2023 09:25:02 -------------------------------------------------------------------------------- Encounter Discharge Information Details Patient Name: Date of Service: [name], [name] 04/09/2023 9:30 A M Medical Record Number: [id number] Patient Account Number: [id number] Date of Birth/Sex: Treating RN: 1965/12/17 (57 y.o. [name] Primary Care Dorrance Sellick: [name] Other Clinician: Referring Shemika Robbs: Treating Devon Kingdon/Extender: [name] in Treatment: 52 Encounter Discharge Information Items Post Procedure  Vitals Discharge Condition: Stable Temperature (F): 97.8 Ambulatory Status: Ambulatory Pulse (bpm): 94 Discharge Destination: Home Respiratory Rate (breaths/min): 18 Transportation: Private Auto Blood Pressure (mmHg): 113/81 Accompanied By: son Schedule Follow-up Appointment: Yes Clinical Summary of Care: Patient Declined Electronic Signature(s) Signed: 04/09/2023 5:05:11 PM By: Karie Schwalbe RN Entered By: Karie Schwalbe on 04/09/2023 17:04:49 [name] ([id number]) [id number] Page 2 of 6 -------------------------------------------------------------------------------- Lower Extremity Assessment Details Patient Name: Date of Service: [name], [name] 04/09/2023 9:30 A M Medical Record Number: [id number] Patient Account Number: [id number] Date of Birth/Sex: Treating RN: August 10, 1966 (57 y.o. F) Primary Care Chavie Kolinski: [name] Other Clinician: Referring Morgane Joerger: Treating Lejuan Botto/Extender: [name] in Treatment: 43 Edema Assessment Assessed: [Left: No] [Right: No] Edema: [Left: Ye] [Right: s] Calf Left: Right: Point of Measurement: 32 cm From Medial Instep 52 cm Ankle Left: Right: Point of Measurement: 10 cm From Medial Instep 35 cm Vascular Assessment Pulses: Dorsalis Pedis Palpable: [Right:Yes] Electronic Signature(s) Signed: 04/09/2023 5:05:11 PM By: Karie Schwalbe RN Entered By: Karie Schwalbe on 04/09/2023 09:59:17 -------------------------------------------------------------------------------- Multi-Disciplinary Care Plan Details Patient Name: Date of Service: [name], [name] 04/09/2023 9:30 A M Medical Record Number: [id number] Patient Account Number: [id number] Date of Birth/Sex: Treating RN: July 17, 1966 (57 y.o. [name] Primary Care Akeira Lahm: [name] Other Clinician: Referring Vernessa Likes: Treating Kore Madlock/Extender: [name] in Treatment: 28 Active Inactive Wound/Skin  Impairment Nursing Diagnoses: Impaired tissue integrity Knowledge deficit related to ulceration/compromised skin integrity Goals: Patient will have a decrease in wound volume by X% from date: (specify in notes) Date Initiated: 06/12/2022 Target Resolution Date: 06/07/2023 Goal Status: Active Patient/caregiver will verbalize understanding of skin care regimen Date Initiated: 06/12/2022 Target Resolution Date: 06/07/2023 Goal Status: Active Ulcer/skin breakdown will have a volume reduction of 30% by week 4 [name] ([id number]) [id number] Page 3 of 6 Date Initiated: 06/12/2022 Date Inactivated: 07/31/2022 Target Resolution Date: 08/03/2022 Goal Status: Unmet Unmet Reason: dx with osteomyelitis. Ulcer/skin breakdown will have a volume reduction of  50% by week 8 Date Initiated: 06/12/2022 Date Inactivated: 10/16/2022 Target Resolution Date: 10/05/2022 Unmet Reason: see wound Goal Status: Unmet measurement. IV antibiotics Interventions: Assess patient/caregiver ability to obtain necessary supplies Assess patient/caregiver ability to perform ulcer/skin care regimen upon admission and as needed Assess ulceration(s) every visit Notes: Electronic Signature(s) Signed: 04/09/2023 5:05:11 PM By: Karie Schwalbe RN Entered By: Karie Schwalbe on 04/09/2023 17:01:51 -------------------------------------------------------------------------------- Pain Assessment Details Patient Name: Date of Service: [name], [name] 04/09/2023 9:30 A M Medical Record Number: [id number] Patient Account Number: [id number] Date of Birth/Sex: Treating RN: 03/28/1966 (57 y.o. F) Primary Care Braylan Faul: [name] Other Clinician: Referring Ramar Nobrega: Treating Dariel Betzer/Extender: [name] in Treatment: 25 Active Problems Location of Pain Severity and Description of Pain Patient Has Paino Yes Site Locations Pain Location: Pain in Ulcers Rate the pain. Current Pain  Level: 3 Pain Management and Medication Current Pain Management: Electronic Signature(s) Signed: 04/09/2023 4:52:44 PM By: Thayer Dallas Entered By: Thayer Dallas on 04/09/2023 09:25:22 [name], [name] ([id number]) [id number] Page 4 of 6 -------------------------------------------------------------------------------- Patient/Caregiver Education Details Patient Name: Date of Service: [name], [name] 7/3/2024andnbsp9:30 A M Medical Record Number: [id number] Patient Account Number: [id number] Date of Birth/Gender: Treating RN: 1966/03/18 (57 y.o. [name] Primary Care Physician: [name] Other Clinician: Referring Physician: Treating Physician/Extender: [name] in Treatment: 40 Education Assessment Education Provided To: Patient Education Topics Provided Wound/Skin Impairment: Methods: Explain/Verbal Responses: Return demonstration correctly Electronic Signature(s) Signed: 04/09/2023 5:05:11 PM By: Karie Schwalbe RN Entered By: Karie Schwalbe on 04/09/2023 17:02:12 -------------------------------------------------------------------------------- Wound Assessment Details Patient Name: Date of Service: [name], [name] 04/09/2023 9:30 A M Medical Record Number: [id number] Patient Account Number: [id number] Date of Birth/Sex: Treating RN: 08-May-1966 (57 y.o. F) Primary Care Quintel Mccalla: [name] Other Clinician: Referring Amr Sturtevant: Treating Lyra Alaimo/Extender: [name] in Treatment: 56 Wound Status Wound Number: 1 Primary Etiology: Diabetic Wound/Ulcer of the Lower Extremity Wound Location: Right Calcaneus Wound Status: Open Wounding Event: Gradually Appeared Comorbid History: Hypertension, Type II Diabetes, Osteomyelitis Date Acquired: 11/07/2020 Weeks Of Treatment: 43 Clustered Wound: Yes Photos Wound Measurements Length: (cm) 0 Width: (cm) 0 Depth: (cm) 0 Clustered Quantity: 1 Area:  (cm) Volume: (cm) .9 % Reduction in Area: 95.1% .8 % Reduction in Volume: 90.3% .4 Epithelialization: Medium (34-66%) Tunneling: No 0.565 Undermining: No 0.226 Wound Description [name], [name] ([id number]) Classification: Grade 3 Wound Margin: Thickened Exudate Amount: Medium Exudate Type: Serosanguineous Exudate Color: red, brown [phone].pdf Page 5 of 6 Foul Odor After Cleansing: No Slough/Fibrino Yes Wound Bed Granulation Amount: Large (67-100%) Exposed Structure Granulation Quality: Red, Pink Fascia Exposed: No Necrotic Amount: Small (1-33%) Fat Layer (Subcutaneous Tissue) Exposed: Yes Necrotic Quality: Eschar, Adherent Slough Tendon Exposed: No Muscle Exposed: No Joint Exposed: No Bone Exposed: No Periwound Skin Texture Texture Color No Abnormalities Noted: No No Abnormalities Noted: No Callus: Yes Atrophie Blanche: No Crepitus: No Cyanosis: No Excoriation: No Ecchymosis: No Induration: No Erythema: No Rash: No Hemosiderin Staining: No Scarring: No Mottled: No Pallor: No Moisture Rubor: No No Abnormalities Noted: No Dry / Scaly: No Temperature / Pain Maceration: Yes Temperature: No Abnormality Tenderness on Palpation: Yes Treatment Notes Wound #1 (Calcaneus) Wound Laterality: Right Cleanser Soap and Water Discharge Instruction: May shower and wash wound with dial antibacterial soap and water prior to dressing change. Wound Cleanser Discharge Instruction: Cleanse the wound with wound cleanser prior to applying a clean dressing using gauze sponges, not tissue or cotton balls. Peri-Wound Care Sween Lotion (Moisturizing lotion) Discharge  Instruction: Apply moisturizing lotion as directed Topical Primary Dressing Hydrofera Blue Ready Transfer Foam, 2.5x2.5 (in/in) Discharge Instruction: cut a small piece to place inside wound and then place Hydrafera Blue ready, a small piece on the wound. Secondary Dressing ABD Pad,  5x9 Discharge Instruction: Apply over primary dressing as directed. Woven Gauze Sponge, Non-Sterile 4x4 in Discharge Instruction: Apply over primary dressing as directed. heel ortho felt Discharge Instruction: apply in shoe to aid in offloading of heel and wound. (if not using the ABD pad) Secured With Conforming Stretch Gauze Bandage Roll, Sterile 4x75 (in/in) Discharge Instruction: Secure with stretch gauze as directed. 44M Medipore H Soft Cloth Surgical T ape, 4 x 10 (in/yd) Discharge Instruction: Secure with tape as directed. Compression Wrap Compression Stockings Add-Ons Electronic Signature(s) Signed: 04/09/2023 5:05:11 PM By: Karie Schwalbe RN [name], [name] ([id number]) [id number] Page 6 of 6 Entered By: Karie Schwalbe on 04/09/2023 09:54:47 -------------------------------------------------------------------------------- Vitals Details Patient Name: Date of Service: [name], [name] 04/09/2023 9:30 A M Medical Record Number: [id number] Patient Account Number: [id number] Date of Birth/Sex: Treating RN: Jun 01, 1966 (57 y.o. F) Primary Care Angeles Zehner: [name] Other Clinician: Referring Sharyon Peitz: Treating Robb Sibal/Extender: [name] in Treatment: 48 Vital Signs Time Taken: 09:28 Temperature (F): 97.8 Height (in): 65 Pulse (bpm): 94 Weight (lbs): 331 Respiratory Rate (breaths/min): 18 Body Mass Index (BMI): 55.1 Blood Pressure (mmHg): 113/81 Reference Range: 80 - 120 mg / dl Electronic Signature(s) Signed: 04/09/2023 4:52:44 PM By: Thayer Dallas Entered By: Thayer Dallas on 04/09/2023 78:29:56

## 2023-04-16 ENCOUNTER — Encounter (HOSPITAL_BASED_OUTPATIENT_CLINIC_OR_DEPARTMENT_OTHER): Payer: No Typology Code available for payment source | Admitting: Physician Assistant

## 2023-04-16 DIAGNOSIS — E11621 Type 2 diabetes mellitus with foot ulcer: Secondary | ICD-10-CM | POA: Diagnosis not present

## 2023-04-16 NOTE — Progress Notes (Addendum)
[name], [name] ([id number]) [id number] Page 1 of 10 Visit Report for 04/16/2023 Chief Complaint Document Details Patient Name: Date of Service: [name], [name] 04/16/2023 9:30 A M Medical Record Number: [id number] Patient Account Number: [id number] Date of Birth/Sex: Treating RN: Feb 13, 1966 (57 y.o. F) Primary Care Provider: [name] Other Clinician: Referring Provider: Treating Provider/Extender: [name] in Treatment: 21 Information Obtained from: Patient Chief Complaint Right heel ulcer Electronic Signature(s) Signed: 04/16/2023 9:39:33 AM By: Allen Derry PA-C Entered By: Allen Derry on 04/16/2023 09:39:33 -------------------------------------------------------------------------------- HPI Details Patient Name: Date of Service: [name], [name] 04/16/2023 9:30 A M Medical Record Number: [id number] Patient Account Number: [id number] Date of Birth/Sex: Treating RN: 14-Aug-1966 (57 y.o. F) Primary Care Provider: [name] Other Clinician: Referring Provider: Treating Provider/Extender: [name] in Treatment: 53 History of Present Illness HPI Description: 06-13-2022 upon evaluation today patient presents for evaluation of her right heel ulcer. She is having a tremendous amount of pain at this location. She tells me that her most recent hemoglobin A1c was 8.7 and that was on 08-23-2021. This ulcer on the heel has been present she states since February 2023 when she had an injection to the heel by podiatry. She states that she began to have increasing pain the wound opened and has been open ever since. 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 She had an x-ray in June if anything can although there did not appear to be any obvious evidence of osteomyelitis at that point. She subsequently has had OR debridement several times of the wound performed by podiatry. Patient does have a history  of lymphedema of the lower extremities she is also a type II diabetic. 06-19-2022 upon evaluation today patient appears to be doing better in regard to the size of her leg which is significantly improved. With that being said she had a lot of drainage from the heel which is completely understandable considering what is going on at this time. There does not appear to be any evidence of active infection there is no warmth or irritation to the leg in general. With that being said the patient does have an issue here with the amount of drainage that is going on I definitely think Zetuvit is good to be the better way to go in place of ABD pads. Also think that she could potentially benefit from 3 times per week dressing changes but again right now we are going to stick with the to change it today, change in her Friday, and then subsequently seeing where things stand next Wednesday. The other option would be to change her to a different day for wound care having her come then say like a Tuesday Friday or Monday Thursday. 06-26-2022 upon evaluation patient's wound bed actually showed signs of doing well in regard to the overall size it was measuring slightly smaller. With that being said unfortunately the biggest issue we see is she still has a tremendous amount of drainage which is what could keep this from being able to use a total contact cast. For that reason I am did going to discuss with her today the possibility of trying Tubigrip to see if that could be of benefit for her. She voiced understanding and is in agreement with giving this a try. 07-03-2022 upon evaluation today patient unfortunately appears to be doing significantly worse at this point in regard to her swelling due to the fact that she was unable to really wear the  Tubigrip. She tells me that it was cutting into her. With that being said she also did have an MRI this MRI revealed that she does have osteomyelitis in the heel this was actually  just performed Monday, 25 September. This does show signs of "early osteomyelitis". Nonetheless this is still concerning to me. The wound also appears to be getting deeper in the center aspect of this which has me a little concerned as well. I do believe that she is likely going require an aggressive approach here to try to get things better. I discussed that in greater detail with her today. I will detailed in the plan. 07-17-2022 upon evaluation today patient appears to be doing poorly in regard to her wound. Unfortunately she does not show any signs of infection systemically though locally this seems to be doing worse with the wound actually being bigger than where it was previous. She did have an MRI that showed evidence of osteomyelitis actually recommended a referral to ID she was evaluated infectious disease and they recommended referral to podiatry and to be honest have recommended amputation based on what the patient tells me today. With that being said this is something that she is not interested in at all to be [name] ([id number]) [id number] Page 2 of 10 perfectly honest. For that reason she wants to know what she should do and where she should go at this point that is where the majority of the conversation which was quite lengthy today went. Obviously understand her concern here but I think she is going to have to definitely get off this and likely this means she is going to need to come out of work. 10/18; Since the patient was the patient is using Hydrofera Blue on her right heel wound. She has a new larger open area superior to the wound. The skin on the bottom of her foot is completely macerated. I reviewed the infectious disease note on this patient from 10/9. They recommended keeping the patient on Delofloxacin 450 twice daily until follow-up tomorrow. The patient states she is not going back there as the only thing they wanted to do was "cut my leg  off]. She has not seen podiatry. Her lab works shows a CRP markedly elevated at 240.2 a sedimentation rate of 96 the rest of her CBC is normal creatinine of 0.38 The patient has been to see her primary doctor who is Dr. Leonides Schanz [SPo] In [place]. According the patient Dr. Loralee Pacas has ordered vancomycin and Zosyn to start on November 1. She is not taking the delofloxacin that was suggested by infectious disease. Very angry that they just wanted to consider her for an amputation although I do not specifically see that stated in their note 07-31-2022 upon evaluation today patient still has a significant wound over her plantar aspect of her right l foot region. With that being said I am definitely concerned about the fact that the patient probably does need to be in hyperbaric oxygen therapy at this point. I think we should try to get this going as quickly as possible. She actually is going to be set up with a PICC line next Wednesday and then following this we will have the vancomycin and Zosyn that she will be taking for 8 weeks according to what she tells me. I think this is definitely going to be beneficial and again the goal here is limb salvage. In combination with that I think the hyperbaric oxygen therapy would be ultimately significantly helpful for  her. 08-07-2022 upon evaluation today patient appears to still be doing quite poorly overall she still has not gotten her antibiotics. She got her PICC line today which I assumed she will be getting the first dose of antibiotics as well during that time. With that being said unfortunately she did not get the antibiotics and in fact as I questioned her further she does not even know when or if that is going to be started this week. Again I am very concerned about this considering she already has a PICC line in place we do not want this to clot off on top of the fact that she should already be on these antibiotics she just never went to the ER for evaluation  therefore they never got this started. At this point based on what I am seeing I really think that the ideal thing would be for [place] to see about getting her to the ER for further evaluation and treatment to see if they can get this started for her as soon as possible. The patient voiced understanding and is in agreement with the plan. I gave her recommendations for what should be done also called [facility] infusion therapy in order to see if they would be the ones that seem to be available for her IV infusions but they tell me that they no longer do this that is apparently where the orders were sent to for her infusion therapy. That was by her primary care provider. 08-21-2022 upon evaluation today patient's wound on the bottom of the heel and foot area as well as the toe appear to be doing significantly better. Fortunately I do not see any evidence of infection locally or systemically and everything is measuring much smaller than where we have been. The wound in the left gluteal region also is dramatically improved compared to last time she was here. She has started the antibiotics this is cefepime and vancomycin. She did have a somewhat high trough and therefore they have her hold that today and they are to recheck in the morning I believe she told me. Nonetheless with the antibiotics going she seems to be doing significantly better which is great news. 08-28-2022 upon evaluation today patient appears to be doing somewhat better in regard to her foot there is definitely less drainage than what we have seen in the past. I do believe the antibiotics are helping. With that being said she is also been approved for hyperbaric oxygen therapy and I think as soon as we get this started the better. I discussed that with her today as well. With that being said I do think that the last thing she needs is her chest x-ray when she has that done will be ready to start her into treatment. She is going to try to go get  that today when she leaves which I think would be ideal. 09-11-2022 upon evaluation today patient appears to be doing well with regard to her foot ulcer. This is showing signs of no digression and she does not appear to be having as much drainage as before though it still very wet is not nearly the extreme of what it was previous. Fortunately there does not appear to be any signs of active infection locally nor systemically at this point which is great news. Patient did have her chest x-ray as well as EKG and she is actually approved and completely cleared for HBO therapy from both a clinical standpoint as well as an insurance standpoint. 09-25-2022 upon  evaluation today patient appears to be doing well in regard to her foot ulcer. Unfortunately she is not doing as well when it comes to her hyperbaric treatments. She is having a lot of trouble with claustrophobia. She tells me in general that she is not sure she is good to be able to continue. 10-09-2022 upon evaluation today patient's wounds actually are showing signs of excellent improvement which is great news. Fortunately I do not see any evidence of infection locally or systemically which is great news and overall I am extremely pleased with where things are currently. I do think that she is making good progress she does tell me at this point today she does not want to go forward with a hyperbaric oxygen therapy. 10-16-2022 upon evaluation today patient appears to be doing excellent in regard to her foot ulcer. This is actually showing signs of excellent improvement. She still does not allow for any sharp debridement but the good news is this is doing much better and does not even need it at the moment. Nonetheless as far as sharp debridement is concerned this has been too painful for really not been able to do that all along. 10-23-2022 upon evaluation today patient actually is making excellent progress. I am extremely pleased with where we stand I  have considered even seeing about putting her in the total contact cast but to be honest she is doing so well currently and still with having some drainage I feel like it is better for her to be able to change it then to be locked up in the cast for a week at a time. She has voiced understanding as well. Overall though I feel like you are making some really good progress here. 10-30-2022 upon evaluation today patient appears to be doing excellent in regard to her foot ulcers. Both are showing signs of improvement which is great news and overall I feel like that they are measuring smaller, looking better, and I see no signs of resurgence of the infection which is great news. Overall I am extremely pleased at this point. 2/7; the patient has 2 wounds which are remanence of the large wound on the right foot. This was a diabetic wound with underlying osteomyelitis she has completed antibiotics. Today she has the open area on the distal calcaneus and an area on the right midfoot which is eschared. She has been using silver alginate and offloading this in a regular running shoe. 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 11-27-2022 upon evaluation today patient appears to be doing well currently in regard to her wound which is actually showing signs of significant improvement this is great news. Fortunately there does not appear to be any signs of active infection locally nor systemically which is also excellent. No fevers, chills, nausea, vomiting, or diarrhea. 12-25-2022 upon evaluation today patient appears to be doing well currently in regard to the distal portion of her foot which is actually in my opinion looking a little better but have a lot of callus she is going require some debridement here. With that being said the heel actually looks like it might be a little bit worse. There is a lot of callus I cannot tell exactly what is open  or not we can remove that and we will see what exactly were dealing with there. With that being said she also has an area on the right side of her leg  which appears to be more of a pressure injury she tells me this is from sitting in her recliner she has gotten a pillow to help take care of this. Fortunately it is not technically open and draining at this point made it very well may be before it is also not done. 01-01-2023 upon evaluation today patient appears to be doing well currently in regard to her wounds I feel like the cefdinir has helped she seems to be improved compared to last week's evaluation. I do not see any signs of active infection locally nor systemically at this time which is great news. No fevers, chills, nausea, vomiting, or diarrhea. 01-08-2023 upon evaluation today patient's wounds on the foot appear to be doing decently well the leg is still about the same. With that being said unfortunately she does have a lot of erythema on the leg in particular which is unfortunate and definitely not what we are looking for. Again I do not think that the [facility] has really done well for her in that regard. Espejo, [name] ([id number]) [id number] Page 3 of 10 01-15-2023 upon evaluation today patient appears to be doing well currently in regard to her wound. Overall I think that things are measuring a little bit better but unfortunately the erythema is spreading. I did review her culture today. Unfortunately it does show that she does have MRSA noted and again I think that we need to do something to try to get this under control as quickly as possible. She has previously been on IV antibiotics and unfortunately that has not been sufficient to get this completely cleared it seems to have come back at this point and she really wants to avoid going back on IV antibiotics. We have been using oral antibiotics and various forms the most recent was actually a course of cefdinir.  She is also previously been on clindamycin and even in the past she has had to doxycycline. With that being said with all things considered I think we are still having a lot of trouble here keeping this infection under control. We previously attempted hyperbarics but the patient did not tolerate this well. She therefore discontinued HBO therapy. 01-22-2023 upon evaluation today patient appears to be doing a little better in regard to the cellulitis on her right leg. I am much more pleased with what I am seeing today. With that being said unfortunately she is still having quite a bit of issues here with erythema though not as bad I think that were not completely clear. Unfortunately we were denied for the [facility] prescription. She has the medication but I provided for her by way of samples but nothing further. 01-29-2023 upon evaluation today patient appears to be doing poorly currently in regard to her leg. I do not feel like she is showing as much improvement as she was within desirable now that we switch her to linezolid. With that being said I do believe that we probably need to see about IV antibiotics I recommended a referral back to ID. With that being said she would prefer to see her physician which I am okay with but either way I think she needs to be back on IV antibiotics for a minimum of 2 months as soon as possible. I am going to send her for lab work today in order to get some baseline labs but honestly I think that we are going to require IV antibiotics to get this under control. 02-12-2023 upon evaluation today patient appears to be  doing well currently in regard to her wound all things considered. She has not gotten the lab work done yet that we ordered a couple weeks back. She tells me she has had a lot going on with the death of the family. Fortunately there does not appear to be any signs of worsening in fact some of the heel looks a little bit better which is great news. Fortunately I do  not see any evidence of active infection locally nor systemically which is great news. 02-19-24 upon evaluation today patient appears to be doing well currently in regard to her wounds. She has been tolerating the dressing changes without complication. Fortunately there does not appear to be any signs of active infection locally nor systemically which is great news. No fevers, chills, nausea, vomiting, or diarrhea. She is currently on vancomycin and Zosyn through IV which is doing I think well she has been on it for not quite a week at this point. 03-05-2023 upon evaluation today patient appears to be doing well currently in regard to her wound. She has been tolerating the dressing changes all 3 locations actually are significantly smaller even compared to last week I am very pleased with where we stand. I think the antibiotics are really doing a good job here for her. 03-12-2023 upon evaluation today patient has actually continued to make excellent progress in regard to her wounds. I am very pleased with where we stand in fact I think her leg is healed the middle part of her foot I think is close to being closed and the heel does look better though there is some callus that needs to be removed at this point. I did discuss that with her today. 03-19-2023 upon evaluation today patient's wounds are actually showing signs of excellent improvement. I am actually very pleased with where we stand and I think that she is making really good progress here. I do not see any signs of active infection locally nor systemically which is great news. 03-26-2023 upon evaluation today patient's wounds actually are showing signs of improvement in fact the middle of her foot is pretty much appearing to be completely healed which is great news. This means we are just dealing with the heel itself on the bottom of the foot as far as anything remaining is concerned at this point. I am extremely pleased with where we stand at this  time. 04-09-2023 upon evaluation today patient appears to be doing well currently in regard to the periwound. She has been tolerating the dressing changes without complication. Fortunately there does not appear to be any signs of active infection locally or systemically at this time. Potentially seeing about getting a total contact cast on and try to get this closed as quickly as possible. I think the casting is probably our best bet at this point and the patient is in agreement with that plan. At this point based on what I am seeing I do believe that the patient would benefit from a continuation of care with regard to dressing changes regularly. 04-16-2023 upon evaluation today patient appears to be doing well currently in regard to her wound which is actually showing signs of good improvement. Fortunately I do not see any evidence of active infection locally or systemically which is great news and in general I do believe that we are making good headway towards complete closure. I do think she is a candidate however for total contact cast which I think will speed up the closure process.  Electronic Signature(s) Signed: 04/16/2023 10:10:46 AM By: Allen Derry PA-C Entered By: Allen Derry on 04/16/2023 10:10:46 -------------------------------------------------------------------------------- Physical Exam Details Patient Name: Date of Service: [name], [name] 04/16/2023 9:30 A M Medical Record Number: [id number] Patient Account Number: [id number] Date of Birth/Sex: Treating RN: 1966/07/24 (57 y.o. F) Primary Care Provider: [name] Other Clinician: Referring Provider: Treating Provider/Extender: [name] in Treatment: 25 Constitutional Well-nourished and well-hydrated in no acute distress. Respiratory normal breathing without difficulty. Psychiatric this patient is able to make decisions and demonstrates good insight into disease process. Alert and Oriented x 3. pleasant  and cooperative. Notes Kindley, Desa ([id number]) [id number] Page 4 of 10 Upon inspection patient's wound bed actually showed signs of good granulation epithelization at this point. Fortunately I do not see any signs of worsening overall and in general I do believe that we are making good headway towards complete closure which is great news. Electronic Signature(s) Signed: 04/16/2023 10:11:02 AM By: Allen Derry PA-C Entered By: Allen Derry on 04/16/2023 10:11:02 -------------------------------------------------------------------------------- Physician Orders Details Patient Name: Date of Service: LAIRA, PENNINGER 04/16/2023 9:30 A M Medical Record Number: [id number] Patient Account Number: [id number] Date of Birth/Sex: Treating RN: 26-Oct-1965 (57 y.o. [name], Millard.Loa Primary Care Provider: [name] Other Clinician: Referring Provider: Treating Provider/Extender: [name] in Treatment: 55 Verbal / Phone Orders: No Diagnosis Coding ICD-10 Coding Code Description [phone] Other chronic osteomyelitis, right ankle and foot E11.621 Type 2 diabetes mellitus with foot ulcer L97.412 Non-pressure chronic ulcer of right heel and midfoot with fat layer exposed I89.0 Lymphedema, not elsewhere classified L97.812 Non-pressure chronic ulcer of other part of right lower leg with fat layer exposed Follow-up Appointments ppointment in 1 week. Allen Derry III PA-C Wednesday 1100 room 8 04/23/2023 Return A ppointment in 2 weeks. Leonard Schwartz Wednesday room 8 0845 04/30/2023 Return A ppointment in: - ++ return 04/18/23 at 10:30am for T Contact Cast Change. otal Return A Anesthetic (In clinic) Topical Lidocaine 5% applied to wound bed Bathing/ Shower/ Hygiene May shower with protection but do not get wound dressing(s) wet. Protect dressing(s) with water repellant cover (for example, large plastic bag) or a cast cover and may then take shower. Edema Control  - Lymphedema / SCD / Other Elevate legs to the level of the heart or above for 30 minutes daily and/or when sitting for 3-4 times a day throughout the day. Avoid standing for long periods of time. Off-Loading Total Contact Cast to Right Lower Extremity - size 4 Wound Treatment Wound #1 - Calcaneus Wound Laterality: Right Cleanser: Soap and Water 1 x Per Week/30 Days Discharge Instructions: May shower and wash wound with dial antibacterial soap and water prior to dressing change. Cleanser: Wound Cleanser (Generic) 1 x Per Week/30 Days Discharge Instructions: Cleanse the wound with wound cleanser prior to applying a clean dressing using gauze sponges, not tissue or cotton balls. Peri-Wound Care: Sween Lotion (Moisturizing lotion) 1 x Per Week/30 Days Discharge Instructions: Apply moisturizing lotion as directed Prim Dressing: Hydrofera Blue Ready Transfer Foam, 2.5x2.5 (in/in) 1 x Per Week/30 Days ary Discharge Instructions: cut a small piece to place inside wound and then place Hydrafera Blue ready, a small piece on the wound.then apply another piece over the small first piece. Secondary Dressing: ABD Pad, 5x9 (Generic) 1 x Per Week/30 Days Discharge Instructions: Apply over primary dressing as directed. Secondary Dressing: Woven Gauze Sponge, Non-Sterile 4x4 in (Generic) 1 x Per Week/30 Days Discharge Instructions: Apply over primary  dressing as directed. Secured With: [employer], 4.5x3.1 (in/yd) 1 x Per Week/30 Days [name], [name] ([id number]) [phone].pdf Page 5 of 10 Discharge Instructions: Secure with Kerlix as directed. Secured With: 78M Medipore H Soft Cloth Surgical T ape, 4 x 10 (in/yd) 1 x Per Week/30 Days Discharge Instructions: Secure with tape as directed. Electronic Signature(s) Signed: 04/16/2023 5:48:19 PM By: Allen Derry PA-C Signed: 04/16/2023 6:00:37 PM By: Shawn Stall RN, BSN Entered By: Shawn Stall on 04/16/2023  09:46:14 -------------------------------------------------------------------------------- Problem List Details Patient Name: Date of Service: [name], [name] 04/16/2023 9:30 A M Medical Record Number: [id number] Patient Account Number: [id number] Date of Birth/Sex: Treating RN: 02-03-1966 (57 y.o. F) Primary Care Provider: [name] Other Clinician: Referring Provider: Treating Provider/Extender: [name] in Treatment: 57 Active Problems ICD-10 Encounter Code Description Active Date MDM Diagnosis [phone] Other chronic osteomyelitis, right ankle and foot 08/28/2022 No Yes E11.621 Type 2 diabetes mellitus with foot ulcer 06/12/2022 No Yes L97.412 Non-pressure chronic ulcer of right heel and midfoot with fat layer exposed 06/12/2022 No Yes I89.0 Lymphedema, not elsewhere classified 06/12/2022 No Yes L97.812 Non-pressure chronic ulcer of other part of right lower leg with fat layer 03/05/2023 No Yes exposed Inactive Problems Resolved Problems Electronic Signature(s) Signed: 04/16/2023 9:39:19 AM By: Allen Derry PA-C Entered By: Allen Derry on 04/16/2023 09:39:19 Progress Note Details -------------------------------------------------------------------------------- Darral Dash ([id number]) [id number] Page 6 of 10 Patient Name: Date of Service: [name], [name] 04/16/2023 9:30 A M Medical Record Number: [id number] Patient Account Number: [id number] Date of Birth/Sex: Treating RN: 05/14/1966 (57 y.o. F) Primary Care Provider: [name] Other Clinician: Referring Provider: Treating Provider/Extender: [name] in Treatment: 66 Subjective Chief Complaint Information obtained from Patient Right heel ulcer History of Present Illness (HPI) 06-13-2022 upon evaluation today patient presents for evaluation of her right heel ulcer. She is having a tremendous amount of pain at this location. She tells me that her most  recent hemoglobin A1c was 8.7 and that was on 08-23-2021. This ulcer on the heel has been present she states since February 2023 when she had an injection to the heel by podiatry. She states that she began to have increasing pain the wound opened and has been open ever since. 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 She had an x-ray in June if anything can although there did not appear to be any obvious evidence of osteomyelitis at that point. She subsequently has had OR debridement several times of the wound performed by podiatry. Patient does have a history of lymphedema of the lower extremities she is also a type II diabetic. 06-19-2022 upon evaluation today patient appears to be doing better in regard to the size of her leg which is significantly improved. With that being said she had a lot of drainage from the heel which is completely understandable considering what is going on at this time. There does not appear to be any evidence of active infection there is no warmth or irritation to the leg in general. With that being said the patient does have an issue here with the amount of drainage that is going on I definitely think Zetuvit is good to be the better way to go in place of ABD pads. Also think that she could potentially benefit from 3 times per week dressing changes but again right now we are going to stick with the to change it today, change in her Friday, and then subsequently seeing where things  stand next Wednesday. The other option would be to change her to a different day for wound care having her come then say like a Tuesday Friday or Monday Thursday. 06-26-2022 upon evaluation patient's wound bed actually showed signs of doing well in regard to the overall size it was measuring slightly smaller. With that being said unfortunately the biggest issue we see is she still has a tremendous amount of drainage which is what could keep this from being able to use a  total contact cast. For that reason I am did going to discuss with her today the possibility of trying Tubigrip to see if that could be of benefit for her. She voiced understanding and is in agreement with giving this a try. 07-03-2022 upon evaluation today patient unfortunately appears to be doing significantly worse at this point in regard to her swelling due to the fact that she was unable to really wear the Tubigrip. She tells me that it was cutting into her. With that being said she also did have an MRI this MRI revealed that she does have osteomyelitis in the heel this was actually just performed Monday, 25 September. This does show signs of "early osteomyelitis". Nonetheless this is still concerning to me. The wound also appears to be getting deeper in the center aspect of this which has me a little concerned as well. I do believe that she is likely going require an aggressive approach here to try to get things better. I discussed that in greater detail with her today. I will detailed in the plan. 07-17-2022 upon evaluation today patient appears to be doing poorly in regard to her wound. Unfortunately she does not show any signs of infection systemically though locally this seems to be doing worse with the wound actually being bigger than where it was previous. She did have an MRI that showed evidence of osteomyelitis actually recommended a referral to ID she was evaluated infectious disease and they recommended referral to podiatry and to be honest have recommended amputation based on what the patient tells me today. With that being said this is something that she is not interested in at all to be perfectly honest. For that reason she wants to know what she should do and where she should go at this point that is where the majority of the conversation which was quite lengthy today went. Obviously understand her concern here but I think she is going to have to definitely get off this and likely  this means she is going to need to come out of work. 10/18; Since the patient was the patient is using Hydrofera Blue on her right heel wound. She has a new larger open area superior to the wound. The skin on the bottom of her foot is completely macerated. I reviewed the infectious disease note on this patient from 10/9. They recommended keeping the patient on Delofloxacin 450 twice daily until follow-up tomorrow. The patient states she is not going back there as the only thing they wanted to do was "cut my leg off]. She has not seen podiatry. Her lab works shows a CRP markedly elevated at 240.2 a sedimentation rate of 96 the rest of her CBC is normal creatinine of 0.38 The patient has been to see her primary doctor who is Dr. Leonides Schanz [SP[facility]] In [place]. According the patient Dr. Loralee Pacas has ordered vancomycin and Zosyn to start on November 1. She is not taking the delofloxacin that was suggested by infectious disease. Very angry that they  just wanted to consider her for an amputation although I do not specifically see that stated in their note 07-31-2022 upon evaluation today patient still has a significant wound over her plantar aspect of her right l foot region. With that being said I am definitely concerned about the fact that the patient probably does need to be in hyperbaric oxygen therapy at this point. I think we should try to get this going as quickly as possible. She actually is going to be set up with a PICC line next Wednesday and then following this we will have the vancomycin and Zosyn that she will be taking for 8 weeks according to what she tells me. I think this is definitely going to be beneficial and again the goal here is limb salvage. In combination with that I think the hyperbaric oxygen therapy would be ultimately significantly helpful for her. 08-07-2022 upon evaluation today patient appears to still be doing quite poorly overall she still has not gotten her antibiotics. She got her  PICC line today which I assumed she will be getting the first dose of antibiotics as well during that time. With that being said unfortunately she did not get the antibiotics and in fact as I questioned her further she does not even know when or if that is going to be started this week. Again I am very concerned about this considering she already has a PICC line in place we do not want this to clot off on top of the fact that she should already be on these antibiotics she just never went to the ER for evaluation therefore they never got this started. At this point based on what I am seeing I really think that the ideal thing would be for [place] to see about getting her to the ER for further evaluation and treatment to see if they can get this started for her as soon as possible. The patient voiced understanding and is in agreement with the plan. I gave her recommendations for what should be done also called [facility] infusion therapy in order to see if they would be the ones that seem to be available for her IV infusions but they tell me that they no longer do this that is apparently where the orders were sent to for her infusion therapy. That was by her primary care provider. 08-21-2022 upon evaluation today patient's wound on the bottom of the heel and foot area as well as the toe appear to be doing significantly better. Fortunately I do not see any evidence of infection locally or systemically and everything is measuring much smaller than where we have been. The wound in the left gluteal region also is dramatically improved compared to last time she was here. She has started the antibiotics this is cefepime and vancomycin. She did have a somewhat high trough and therefore they have her hold that today and they are to recheck in the morning I believe she told me. Nonetheless with the antibiotics going she seems to be doing significantly better which is great news. 08-28-2022 upon evaluation today patient  appears to be doing somewhat better in regard to her foot there is definitely less drainage than what we have seen in the past. I do believe the antibiotics are helping. With that being said she is also been approved for hyperbaric oxygen therapy and I think as soon as we get this started the better. I discussed that with her today as well. With that being said I do  think that the last thing she needs is her chest x-ray when she has that done will be ready to start her into treatment. She is going to try to go get that today when she leaves which I think would be ideal. [name], [name] ([id number]) [phone].pdf Page 7 of 10 09-11-2022 upon evaluation today patient appears to be doing well with regard to her foot ulcer. This is showing signs of no digression and she does not appear to be having as much drainage as before though it still very wet is not nearly the extreme of what it was previous. Fortunately there does not appear to be any signs of active infection locally nor systemically at this point which is great news. Patient did have her chest x-ray as well as EKG and she is actually approved and completely cleared for HBO therapy from both a clinical standpoint as well as an insurance standpoint. 09-25-2022 upon evaluation today patient appears to be doing well in regard to her foot ulcer. Unfortunately she is not doing as well when it comes to her hyperbaric treatments. She is having a lot of trouble with claustrophobia. She tells me in general that she is not sure she is good to be able to continue. 10-09-2022 upon evaluation today patient's wounds actually are showing signs of excellent improvement which is great news. Fortunately I do not see any evidence of infection locally or systemically which is great news and overall I am extremely pleased with where things are currently. I do think that she is making good progress she does tell me at this point today she does not  want to go forward with a hyperbaric oxygen therapy. 10-16-2022 upon evaluation today patient appears to be doing excellent in regard to her foot ulcer. This is actually showing signs of excellent improvement. She still does not allow for any sharp debridement but the good news is this is doing much better and does not even need it at the moment. Nonetheless as far as sharp debridement is concerned this has been too painful for really not been able to do that all along. 10-23-2022 upon evaluation today patient actually is making excellent progress. I am extremely pleased with where we stand I have considered even seeing about putting her in the total contact cast but to be honest she is doing so well currently and still with having some drainage I feel like it is better for her to be able to change it then to be locked up in the cast for a week at a time. She has voiced understanding as well. Overall though I feel like you are making some really good progress here. 10-30-2022 upon evaluation today patient appears to be doing excellent in regard to her foot ulcers. Both are showing signs of improvement which is great news and overall I feel like that they are measuring smaller, looking better, and I see no signs of resurgence of the infection which is great news. Overall I am extremely pleased at this point. 2/7; the patient has 2 wounds which are remanence of the large wound on the right foot. This was a diabetic wound with underlying osteomyelitis she has completed antibiotics. Today she has the open area on the distal calcaneus and an area on the right midfoot which is eschared. She has been using silver alginate and offloading this in a regular running shoe. 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 11-27-2022  upon evaluation today patient appears to be doing well currently in regard to her wound which is actually showing signs of  significant improvement this is great news. Fortunately there does not appear to be any signs of active infection locally nor systemically which is also excellent. No fevers, chills, nausea, vomiting, or diarrhea. 12-25-2022 upon evaluation today patient appears to be doing well currently in regard to the distal portion of her foot which is actually in my opinion looking a little better but have a lot of callus she is going require some debridement here. With that being said the heel actually looks like it might be a little bit worse. There is a lot of callus I cannot tell exactly what is open or not we can remove that and we will see what exactly were dealing with there. With that being said she also has an area on the right side of her leg which appears to be more of a pressure injury she tells me this is from sitting in her recliner she has gotten a pillow to help take care of this. Fortunately it is not technically open and draining at this point made it very well may be before it is also not done. 01-01-2023 upon evaluation today patient appears to be doing well currently in regard to her wounds I feel like the cefdinir has helped she seems to be improved compared to last week's evaluation. I do not see any signs of active infection locally nor systemically at this time which is great news. No fevers, chills, nausea, vomiting, or diarrhea. 01-08-2023 upon evaluation today patient's wounds on the foot appear to be doing decently well the leg is still about the same. With that being said unfortunately she does have a lot of erythema on the leg in particular which is unfortunate and definitely not what we are looking for. Again I do not think that the [facility] has really done well for her in that regard. 01-15-2023 upon evaluation today patient appears to be doing well currently in regard to her wound. Overall I think that things are measuring a little bit better but unfortunately the erythema is  spreading. I did review her culture today. Unfortunately it does show that she does have MRSA noted and again I think that we need to do something to try to get this under control as quickly as possible. She has previously been on IV antibiotics and unfortunately that has not been sufficient to get this completely cleared it seems to have come back at this point and she really wants to avoid going back on IV antibiotics. We have been using oral antibiotics and various forms the most recent was actually a course of cefdinir. She is also previously been on clindamycin and even in the past she has had to doxycycline. With that being said with all things considered I think we are still having a lot of trouble here keeping this infection under control. We previously attempted hyperbarics but the patient did not tolerate this well. She therefore discontinued HBO therapy. 01-22-2023 upon evaluation today patient appears to be doing a little better in regard to the cellulitis on her right leg. I am much more pleased with what I am seeing today. With that being said unfortunately she is still having quite a bit of issues here with erythema though not as bad I think that were not completely clear. Unfortunately we were denied for the [facility] prescription. She has the medication but I provided for her  by way of samples but nothing further. 01-29-2023 upon evaluation today patient appears to be doing poorly currently in regard to her leg. I do not feel like she is showing as much improvement as she was within desirable now that we switch her to linezolid. With that being said I do believe that we probably need to see about IV antibiotics I recommended a referral back to ID. With that being said she would prefer to see her physician which I am okay with but either way I think she needs to be back on IV antibiotics for a minimum of 2 months as soon as possible. I am going to send her for lab work today in order to get  some baseline labs but honestly I think that we are going to require IV antibiotics to get this under control. 02-12-2023 upon evaluation today patient appears to be doing well currently in regard to her wound all things considered. She has not gotten the lab work done yet that we ordered a couple weeks back. She tells me she has had a lot going on with the death of the family. Fortunately there does not appear to be any signs of worsening in fact some of the heel looks a little bit better which is great news. Fortunately I do not see any evidence of active infection locally nor systemically which is great news. 02-19-24 upon evaluation today patient appears to be doing well currently in regard to her wounds. She has been tolerating the dressing changes without complication. Fortunately there does not appear to be any signs of active infection locally nor systemically which is great news. No fevers, chills, nausea, vomiting, or diarrhea. She is currently on vancomycin and Zosyn through IV which is doing I think well she has been on it for not quite a week at this point. 03-05-2023 upon evaluation today patient appears to be doing well currently in regard to her wound. She has been tolerating the dressing changes all 3 locations actually are significantly smaller even compared to last week I am very pleased with where we stand. I think the antibiotics are really doing a good job here for her. 03-12-2023 upon evaluation today patient has actually continued to make excellent progress in regard to her wounds. I am very pleased with where we stand in fact I think her leg is healed the middle part of her foot I think is close to being closed and the heel does look better though there is some callus that needs to be removed at this point. I did discuss that with her today. 03-19-2023 upon evaluation today patient's wounds are actually showing signs of excellent improvement. I am actually very pleased with where we  stand and I think that she is making really good progress here. I do not see any signs of active infection locally nor systemically which is great news. 03-26-2023 upon evaluation today patient's wounds actually are showing signs of improvement in fact the middle of her foot is pretty much appearing to be completely healed which is great news. This means we are just dealing with the heel itself on the bottom of the foot as far as anything remaining is concerned at this point. I am extremely pleased with where we stand at this time. 04-09-2023 upon evaluation today patient appears to be doing well currently in regard to the periwound. She has been tolerating the dressing changes without complication. Fortunately there does not appear to be any signs of active infection locally  or systemically at this time. Potentially seeing about getting a total [name], [name] ([id number]) [phone].pdf Page 8 of 10 contact cast on and try to get this closed as quickly as possible. I think the casting is probably our best bet at this point and the patient is in agreement with that plan. At this point based on what I am seeing I do believe that the patient would benefit from a continuation of care with regard to dressing changes regularly. 04-16-2023 upon evaluation today patient appears to be doing well currently in regard to her wound which is actually showing signs of good improvement. Fortunately I do not see any evidence of active infection locally or systemically which is great news and in general I do believe that we are making good headway towards complete closure. I do think she is a candidate however for total contact cast which I think will speed up the closure process. Objective Constitutional Well-nourished and well-hydrated in no acute distress. Vitals Time Taken: 9:24 AM, Weight: 331 lbs, Temperature: 99 F, Pulse: 96 bpm, Respiratory Rate: 20 breaths/min, Blood Pressure: 115/68  mmHg. Respiratory normal breathing without difficulty. Psychiatric this patient is able to make decisions and demonstrates good insight into disease process. Alert and Oriented x 3. pleasant and cooperative. General Notes: Upon inspection patient's wound bed actually showed signs of good granulation epithelization at this point. Fortunately I do not see any signs of worsening overall and in general I do believe that we are making good headway towards complete closure which is great news. Integumentary (Hair, Skin) Wound #1 status is Open. Original cause of wound was Gradually Appeared. The date acquired was: 11/07/2020. The wound has been in treatment 44 weeks. The wound is located on the Right Calcaneus. The wound measures 0.9cm length x 0.8cm width x 0.3cm depth; 0.565cm^2 area and 0.17cm^3 volume. There is Fat Layer (Subcutaneous Tissue) exposed. There is no tunneling or undermining noted. There is a medium amount of serosanguineous drainage noted. The wound margin is thickened. There is large (67-100%) red, pink granulation within the wound bed. There is no necrotic tissue within the wound bed. The periwound skin appearance exhibited: Callus. The periwound skin appearance did not exhibit: Crepitus, Excoriation, Induration, Rash, Scarring, Dry/Scaly, Maceration, Atrophie Blanche, Cyanosis, Ecchymosis, Hemosiderin Staining, Mottled, Pallor, Rubor, Erythema. Periwound temperature was noted as No Abnormality. The periwound has tenderness on palpation. Assessment Active Problems ICD-10 Other chronic osteomyelitis, right ankle and foot Type 2 diabetes mellitus with foot ulcer Non-pressure chronic ulcer of right heel and midfoot with fat layer exposed Lymphedema, not elsewhere classified Non-pressure chronic ulcer of other part of right lower leg with fat layer exposed Procedures Wound #1 Pre-procedure diagnosis of Wound #1 is a Diabetic Wound/Ulcer of the Lower Extremity located on the Right  Calcaneus . There was a T Contact Cast otal Procedure by Lenda Kelp, PA. Post procedure Diagnosis Wound #1: Same as Pre-Procedure Notes: size 4. Plan Follow-up Appointments: Return Appointment in 1 week. Allen Derry III PA-C Wednesday 1100 room 8 04/23/2023 Return Appointment in 2 weeks. Leonard Schwartz Wednesday room 8 0845 04/30/2023 Return Appointment in: - ++ return 04/18/23 at 10:30am for T Contact Cast Change. otal Anesthetic: (In clinic) Topical Lidocaine 5% applied to wound bed Bathing/ Shower/ Hygiene: May shower with protection but do not get wound dressing(s) wet. Protect dressing(s) with water repellant cover (for example, large plastic bag) or a cast cover and may then take shower. Edema Control - Lymphedema / SCD / Other: Elevate  legs to the level of the heart or above for 30 minutes daily and/or when sitting for 3-4 times a day throughout the day. Harb, [name] ([id number]) [id number] Page 9 of 10 Avoid standing for long periods of time. Off-Loading: T Contact Cast to Right Lower Extremity - size 4 otal WOUND #1: - Calcaneus Wound Laterality: Right Cleanser: Soap and Water 1 x Per Week/30 Days Discharge Instructions: May shower and wash wound with dial antibacterial soap and water prior to dressing change. Cleanser: Wound Cleanser (Generic) 1 x Per Week/30 Days Discharge Instructions: Cleanse the wound with wound cleanser prior to applying a clean dressing using gauze sponges, not tissue or cotton balls. Peri-Wound Care: Sween Lotion (Moisturizing lotion) 1 x Per Week/30 Days Discharge Instructions: Apply moisturizing lotion as directed Prim Dressing: Hydrofera Blue Ready Transfer Foam, 2.5x2.5 (in/in) 1 x Per Week/30 Days ary Discharge Instructions: cut a small piece to place inside wound and then place Hydrafera Blue ready, a small piece on the wound.then apply another piece over the small first piece. Secondary Dressing: ABD Pad, 5x9 (Generic)  1 x Per Week/30 Days Discharge Instructions: Apply over primary dressing as directed. Secondary Dressing: Woven Gauze Sponge, Non-Sterile 4x4 in (Generic) 1 x Per Week/30 Days Discharge Instructions: Apply over primary dressing as directed. Secured With: [employer], 4.5x3.1 (in/yd) 1 x Per Week/30 Days Discharge Instructions: Secure with Kerlix as directed. Secured With: 31M Medipore H Soft Cloth Surgical T ape, 4 x 10 (in/yd) 1 x Per Week/30 Days Discharge Instructions: Secure with tape as directed. 1. I would recommend that we have the patient continue to monitor for any evidence of infection or worsening. I did apply the total contact cast today and I think she will really do quite well with this. I am going to go ahead and plan to see her or rather have her seen on Friday for a change out of the cast. 2. I would recommend as well that we have the patient continue with the [facility] followed by the ABD pad and then roll gauze to secure in place. This was prior to applying the total contact cast. We will see patient back for reevaluation in 1 week here in the clinic. If anything worsens or changes patient will contact our office for additional recommendations. Electronic Signature(s) Signed: 04/16/2023 10:11:35 AM By: Allen Derry PA-C Entered By: Allen Derry on 04/16/2023 10:11:35 -------------------------------------------------------------------------------- Total Contact Cast Details Patient Name: Date of Service: KARMEL, PATRICELLI 04/16/2023 9:30 A M Medical Record Number: [id number] Patient Account Number: [id number] Date of Birth/Sex: Treating RN: 03-31-1966 (57 y.o. [name] Primary Care Provider: [name] Other Clinician: Referring Provider: Treating Provider/Extender: [name] in Treatment: [phone] T Contact Cast Applied for Wound Assessment: otal Wound #1 Right Calcaneus Performed By: Physician Lenda Kelp, PA Post Procedure  Diagnosis Same as Pre-procedure Notes size 4 Electronic Signature(s) Signed: 04/16/2023 5:48:19 PM By: Allen Derry PA-C Signed: 04/16/2023 6:00:37 PM By: Shawn Stall RN, BSN Entered By: Shawn Stall on 04/16/2023 09:43:41 -------------------------------------------------------------------------------- SuperBill Details Patient Name: Date of Service: [name], [name] 04/16/2023 Medical Record Number: [id number] Patient Account Number: [id number] [name], [name] ([id number]) [id number] Page 10 of 10 Date of Birth/Sex: Treating RN: [birth year] (57 y.o. [name] Primary Care Provider: [name] Other Clinician: Referring Provider: Treating Provider/Extender: [name] in Treatment: 54 Diagnosis Coding ICD-10 Codes Code Description [phone] Other chronic osteomyelitis, right ankle and foot E11.621 Type 2 diabetes mellitus with foot ulcer  L97.412 Non-pressure chronic ulcer of right heel and midfoot with fat layer exposed I89.0 Lymphedema, not elsewhere classified L97.812 Non-pressure chronic ulcer of other part of right lower leg with fat layer exposed Facility Procedures : CPT4 Code: 82956213 2 Description: 9445 - APPLY TOTAL CONTACT LEG CAST ICD-10 Diagnosis Description L97.412 Non-pressure chronic ulcer of right heel and midfoot with fat layer exposed E11.621 Type 2 diabetes mellitus with foot ulcer Modifier: Quantity: 1 Physician Procedures : CPT4 Code Description Modifier [id number] - WC PHYS APPLY TOTAL CONTACT CAST ICD-10 Diagnosis Description L97.412 Non-pressure chronic ulcer of right heel and midfoot with fat layer exposed E11.621 Type 2 diabetes mellitus with foot ulcer Quantity: 1 Electronic Signature(s) Signed: 04/16/2023 10:13:10 AM By: Allen Derry PA-C Entered By: Allen Derry on 04/16/2023 10:13:09

## 2023-04-17 NOTE — Progress Notes (Signed)
Clubb, [name] ([id number]) [id number] Page 1 of 6 Visit Report for 04/16/2023 Arrival Information Details Patient Name: Date of Service: [name], [name] 04/16/2023 9:30 A M Medical Record Number: [id number] Patient Account Number: [id number] Date of Birth/Sex: Treating RN: 10/20/1965 (57 y.o. [name], Millard.Loa Primary Care Zarion Oliff: [name] Other Clinician: Referring Tatsuo Musial: Treating Athens Lebeau/Extender: [name] in Treatment: 58 Visit Information History Since Last Visit Added or deleted any medications: No Patient Arrived: Ambulatory Any new allergies or adverse reactions: No Arrival Time: 09:17 Had a fall or experienced change in No Accompanied By: son activities of daily living that may affect Transfer Assistance: None risk of falls: Patient Identification Verified: Yes Signs or symptoms of abuse/neglect since last visito No Secondary Verification Process Completed: Yes Hospitalized since last visit: No Patient Requires Transmission-Based Precautions: No Implantable device outside of the clinic excluding No Patient Has Alerts: Yes cellular tissue based products placed in the center Patient Alerts: PICC in left arm since last visit: Has Dressing in Place as Prescribed: Yes Pain Present Now: No Electronic Signature(s) Signed: 04/16/2023 6:00:37 PM By: Shawn Stall RN, BSN Entered By: Shawn Stall on 04/16/2023 09:18:00 -------------------------------------------------------------------------------- Encounter Discharge Information Details Patient Name: Date of Service: [name], [name] 04/16/2023 9:30 A M Medical Record Number: [id number] Patient Account Number: [id number] Date of Birth/Sex: Treating RN: 10-14-1965 (57 y.o. [name] Primary Care Melody Savidge: [name] Other Clinician: Referring Decari Duggar: Treating Jaira Canady/Extender: [name] in Treatment: 16 Encounter Discharge Information  Items Discharge Condition: Stable Ambulatory Status: Ambulatory Discharge Destination: Home Transportation: Private Auto Accompanied By: son Schedule Follow-up Appointment: Yes Clinical Summary of Care: Electronic Signature(s) Signed: 04/16/2023 6:00:37 PM By: Shawn Stall RN, BSN Entered By: Shawn Stall on 04/16/2023 09:47:28 [place], Forrest ([id number]) [id number] Page 2 of 6 -------------------------------------------------------------------------------- Lower Extremity Assessment Details Patient Name: Date of Service: [name], [name] 04/16/2023 9:30 A M Medical Record Number: [id number] Patient Account Number: [id number] Date of Birth/Sex: Treating RN: 1965-12-17 (57 y.o. [name] Primary Care Savannha Welle: [name] Other Clinician: Referring Brianca Fortenberry: Treating Kevionna Heffler/Extender: [name] in Treatment: 44 Edema Assessment Assessed: [Left: No] Franne Forts: Yes] Edema: [Left: Ye] [Right: s] Calf Left: Right: Point of Measurement: 32 cm From Medial Instep 50 cm Ankle Left: Right: Point of Measurement: 10 cm From Medial Instep 29 cm Vascular Assessment Pulses: Dorsalis Pedis Palpable: [Right:Yes] Extremity colors, hair growth, and conditions: Extremity Color: [Right:Normal] Hair Growth on Extremity: [Right:No] Temperature of Extremity: [Right:Warm] Capillary Refill: [Right:< 3 seconds] Dependent Rubor: [Right:No] Blanched when Elevated: [Right:No No] Toe Nail Assessment Left: Right: Thick: No Discolored: Yes Deformed: No Improper Length and Hygiene: Yes Electronic Signature(s) Signed: 04/16/2023 6:00:37 PM By: Shawn Stall RN, BSN Entered By: Shawn Stall on 04/16/2023 09:19:38 -------------------------------------------------------------------------------- Multi-Disciplinary Care Plan Details Patient Name: Date of Service: [name] 04/16/2023 9:30 A M Medical Record Number: [id number] Patient Account  Number: [id number] Date of Birth/Sex: Treating RN: Jul 01, 1966 (57 y.o. [name] Primary Care Arish Redner: [name] Other Clinician: Referring Ronel Rodeheaver: Treating Abra Lingenfelter/Extender: [name] in Treatment: [address] [place] ([id number]) [id number] Page 3 of 6 Wound/Skin Impairment Nursing Diagnoses: Impaired tissue integrity Knowledge deficit related to ulceration/compromised skin integrity Goals: Patient will have a decrease in wound volume by X% from date: (specify in notes) Date Initiated: 06/12/2022 Target Resolution Date: 06/07/2023 Goal Status: Active Patient/caregiver will verbalize understanding of skin care regimen Date Initiated: 06/12/2022 Target Resolution Date: 06/07/2023 Goal Status: Active Ulcer/skin breakdown  will have a volume reduction of 30% by week 4 Date Initiated: 06/12/2022 Date Inactivated: 07/31/2022 Target Resolution Date: 08/03/2022 Goal Status: Unmet Unmet Reason: dx with osteomyelitis. Ulcer/skin breakdown will have a volume reduction of 50% by week 8 Date Initiated: 06/12/2022 Date Inactivated: 10/16/2022 Target Resolution Date: 10/05/2022 Unmet Reason: see wound Goal Status: Unmet measurement. IV antibiotics Interventions: Assess patient/caregiver ability to obtain necessary supplies Assess patient/caregiver ability to perform ulcer/skin care regimen upon admission and as needed Assess ulceration(s) every visit Notes: Electronic Signature(s) Signed: 04/16/2023 6:00:37 PM By: Shawn Stall RN, BSN Entered By: Shawn Stall on 04/16/2023 09:42:45 -------------------------------------------------------------------------------- Pain Assessment Details Patient Name: Date of Service: [name], [name] 04/16/2023 9:30 A M Medical Record Number: [id number] Patient Account Number: [id number] Date of Birth/Sex: Treating RN: [birth year] (57 y.o. [name] Primary Care Tykerria Mccubbins: [name]  Other Clinician: Referring Charolette Bultman: Treating Supriya Beaston/Extender: [name] in Treatment: 70 Active Problems Location of Pain Severity and Description of Pain Patient Has Paino Yes Site Locations Pain Location: Pain in Ulcers Rate the pain. Current Pain Level: 2 Pain Management and Medication Current Pain Management: Vital, Leoda ([id number]) [id number] Page 4 of 6 Medication: No Cold Application: No Rest: No Massage: No Activity: No T.E.N.S.: No Heat Application: No Leg drop or elevation: No Is the Current Pain Management Adequate: Adequate How does your wound impact your activities of daily livingo Sleep: No Bathing: No Appetite: No Relationship With Others: No Bladder Continence: No Emotions: No Bowel Continence: No Work: No Toileting: No Drive: No Dressing: No Hobbies: No Psychologist, prison and probation services) Signed: 04/16/2023 6:00:37 PM By: Shawn Stall RN, BSN Entered By: Shawn Stall on 04/16/2023 09:18:12 -------------------------------------------------------------------------------- Patient/Caregiver Education Details Patient Name: Date of Service: Darral Dash 7/10/2024andnbsp9:30 A M Medical Record Number: [id number] Patient Account Number: [id number] Date of Birth/Gender: Treating RN: 1966-01-20 (57 y.o. [name] Primary Care Physician: [name] Other Clinician: Referring Physician: Treating Physician/Extender: [name] in Treatment: 71 Education Assessment Education Provided To: Patient Education Topics Provided Wound/Skin Impairment: Handouts: Caring for Your Ulcer Methods: Explain/Verbal Responses: Reinforcements needed Electronic Signature(s) Signed: 04/16/2023 6:00:37 PM By: Shawn Stall RN, BSN Entered By: Shawn Stall on 04/16/2023 09:43:12 -------------------------------------------------------------------------------- Wound Assessment Details Patient  Name: Date of Service: [name], [name] 04/16/2023 9:30 A M Medical Record Number: [id number] Patient Account Number: [id number] Date of Birth/Sex: Treating RN: 11-29-1965 (57 y.o. [name] Primary Care Corney Knighton: [name] Other Clinician: Referring Marielys Trinidad: Treating Chauntae Hults/Extender: [name] in Treatment: 44 Wound Status Wound Number: 1 Primary Etiology: Diabetic Wound/Ulcer of the Lower Extremity Wound Location: Right Calcaneus Wound Status: Open Wounding Event: Gradually Appeared Comorbid History: Hypertension, Type II Diabetes, Osteomyelitis Severa, Paisely ([id number]) [id number] Page 5 of 6 Date Acquired: 11/07/2020 Weeks Of Treatment: 44 Clustered Wound: Yes Photos Wound Measurements Length: (cm) Width: (cm) Depth: (cm) Clustered Quantity: Area: (cm) Volume: (cm) 0.9 % Reduction in Area: 95.1% 0.8 % Reduction in Volume: 92.7% 0.3 Epithelialization: Medium (34-66%) 1 Tunneling: No 0.565 Undermining: No 0.17 Wound Description Classification: Grade 3 Wound Margin: Thickened Exudate Amount: Medium Exudate Type: Serosanguineous Exudate Color: red, brown Foul Odor After Cleansing: No Slough/Fibrino No Wound Bed Granulation Amount: Large (67-100%) Exposed Structure Granulation Quality: Red, Pink Fascia Exposed: No Necrotic Amount: None Present (0%) Fat Layer (Subcutaneous Tissue) Exposed: Yes Tendon Exposed: No Muscle Exposed: No Joint Exposed: No Bone Exposed: No Periwound Skin Texture Texture Color No Abnormalities Noted: No No Abnormalities Noted: No Callus: Yes Atrophie Blanche:  No Crepitus: No Cyanosis: No Excoriation: No Ecchymosis: No Induration: No Erythema: No Rash: No Hemosiderin Staining: No Scarring: No Mottled: No Pallor: No Moisture Rubor: No No Abnormalities Noted: No Dry / Scaly: No Temperature / Pain Maceration: No Temperature: No Abnormality Tenderness on Palpation:  Yes Treatment Notes Wound #1 (Calcaneus) Wound Laterality: Right Cleanser Soap and Water Discharge Instruction: May shower and wash wound with dial antibacterial soap and water prior to dressing change. Wound Cleanser Discharge Instruction: Cleanse the wound with wound cleanser prior to applying a clean dressing using gauze sponges, not tissue or cotton balls. Peri-Wound Care Sween Lotion (Moisturizing lotion) Discharge Instruction: Apply moisturizing lotion as directed Topical Gatchalian, Euva ([id number]) [id number] Page 6 of 6 Primary Dressing Hydrofera Blue Ready Transfer Foam, 2.5x2.5 (in/in) Discharge Instruction: cut a small piece to place inside wound and then place Hydrafera Blue ready, a small piece on the wound.then apply another piece over the small first piece. Secondary Dressing ABD Pad, 5x9 Discharge Instruction: Apply over primary dressing as directed. Woven Gauze Sponge, Non-Sterile 4x4 in Discharge Instruction: Apply over primary dressing as directed. Secured With [employer], 4.5x3.1 (in/yd) Discharge Instruction: Secure with Kerlix as directed. 67M Medipore H Soft Cloth Surgical T ape, 4 x 10 (in/yd) Discharge Instruction: Secure with tape as directed. Compression Wrap Compression Stockings Add-Ons Electronic Signature(s) Signed: 04/16/2023 6:00:37 PM By: Shawn Stall RN, BSN Entered By: Shawn Stall on 04/16/2023 09:24:34 -------------------------------------------------------------------------------- Vitals Details Patient Name: Date of Service: [name], [name] 04/16/2023 9:30 A M Medical Record Number: [id number] Patient Account Number: [id number] Date of Birth/Sex: Treating RN: 03-25-1966 (57 y.o. [name], Millard.Loa Primary Care Trystyn Dolley: [name] Other Clinician: Referring Yon Schiffman: Treating Nikyla Navedo/Extender: [name] in Treatment: 54 Vital Signs Time Taken: 09:24 Temperature (F): 99 Weight  (lbs): 331 Pulse (bpm): 96 Respiratory Rate (breaths/min): 20 Blood Pressure (mmHg): 115/68 Reference Range: 80 - 120 mg / dl Electronic Signature(s) Signed: 04/16/2023 6:00:37 PM By: Shawn Stall RN, BSN Entered By: Shawn Stall on 04/16/2023 09:25:07

## 2023-04-18 ENCOUNTER — Encounter (HOSPITAL_BASED_OUTPATIENT_CLINIC_OR_DEPARTMENT_OTHER): Payer: No Typology Code available for payment source | Admitting: Internal Medicine

## 2023-04-18 DIAGNOSIS — L97412 Non-pressure chronic ulcer of right heel and midfoot with fat layer exposed: Secondary | ICD-10-CM | POA: Diagnosis not present

## 2023-04-18 DIAGNOSIS — E11621 Type 2 diabetes mellitus with foot ulcer: Secondary | ICD-10-CM | POA: Diagnosis not present

## 2023-04-18 NOTE — Progress Notes (Signed)
Stough, [name] ([id number]) [id number] Page 1 of 7 Visit Report for 04/18/2023 Arrival Information Details Patient Name: Date of Service: [name], [name] 04/18/2023 10:30 A M Medical Record Number: [id number] Patient Account Number: [id number] Date of Birth/Sex: Treating RN: 05/21/1966 (57 y.o. [name] Primary Care Lauriann Milillo: [name] Other Clinician: Referring Bailley Guilford: Treating Cesar Alf/Extender: Harold Barban in Treatment: 48 Visit Information History Since Last Visit Added or deleted any medications: No Patient Arrived: Ambulatory Any new allergies or adverse reactions: No Arrival Time: 10:51 Had a fall or experienced change in No Accompanied By: son activities of daily living that may affect Transfer Assistance: None risk of falls: Patient Identification Verified: Yes Signs or symptoms of abuse/neglect since last visito No Secondary Verification Process Completed: Yes Hospitalized since last visit: No Patient Requires Transmission-Based Precautions: No Implantable device outside of the clinic excluding No Patient Has Alerts: Yes cellular tissue based products placed in the center Patient Alerts: PICC in left arm since last visit: Has Dressing in Place as Prescribed: Yes Has Footwear/Offloading in Place as Prescribed: Yes Right: T Contact Cast otal Pain Present Now: No Electronic Signature(s) Signed: 04/18/2023 1:53:12 PM By: Shawn Stall RN, BSN Entered By: Shawn Stall on 04/18/2023 10:51:29 -------------------------------------------------------------------------------- Encounter Discharge Information Details Patient Name: Date of Service: [name], [name] 04/18/2023 10:30 A M Medical Record Number: [id number] Patient Account Number: [id number] Date of Birth/Sex: Treating RN: Nov 11, 1965 (57 y.o. [name] Primary Care Karlei Waldo: [name] Other Clinician: Referring Jex Strausbaugh: Treating Kairo Laubacher/Extender:  Harold Barban in Treatment: 53 Encounter Discharge Information Items Discharge Condition: Stable Ambulatory Status: Ambulatory Discharge Destination: Home Transportation: Private Auto Accompanied By: son Schedule Follow-up Appointment: Yes Clinical Summary of Care: Electronic Signature(s) Signed: 04/18/2023 1:53:12 PM By: Shawn Stall RN, BSN Entered By: Shawn Stall on 04/18/2023 11:08:51 Hoagland, [name] ([id number]) [id number] Page 2 of 7 -------------------------------------------------------------------------------- Lower Extremity Assessment Details Patient Name: Date of Service: [name], [name] 04/18/2023 10:30 A M Medical Record Number: [id number] Patient Account Number: [id number] Date of Birth/Sex: Treating RN: 05/29/1966 (57 y.o. [name] Primary Care Sheffield Hawker: [name] Other Clinician: Referring Ranetta Armacost: Treating Paxton Kanaan/Extender: Harold Barban in Treatment: 44 Edema Assessment Assessed: Kyra Searles: No] Franne Forts: Yes] Edema: [Left: Ye] [Right: s] Calf Left: Right: Point of Measurement: 32 cm From Medial Instep 49 cm Ankle Left: Right: Point of Measurement: 10 cm From Medial Instep 32 cm Vascular Assessment Pulses: Dorsalis Pedis Palpable: [Right:Yes] Extremity colors, hair growth, and conditions: Extremity Color: [Right:Normal] Hair Growth on Extremity: [Right:No] Temperature of Extremity: [Right:Warm] Capillary Refill: [Right:< 3 seconds] Dependent Rubor: [Right:No No] Toe Nail Assessment Left: Right: Thick: Yes Discolored: Yes Deformed: No Improper Length and Hygiene: Yes Electronic Signature(s) Signed: 04/18/2023 1:53:12 PM By: Shawn Stall RN, BSN Entered By: Shawn Stall on 04/18/2023 11:00:31 -------------------------------------------------------------------------------- Multi Wound Chart Details Patient Name: Date of Service: [name], [name] 04/18/2023 10:30 A M Medical  Record Number: [id number] Patient Account Number: [id number] Date of Birth/Sex: Treating RN: 1965/12/27 (57 y.o. F) Primary Care Nafis Farnan: [name] Other Clinician: Referring Zierra Laroque: Treating Jalena Vanderlinden/Extender: Harold Barban in Treatment: 53 Vital Signs Height(in): Pulse(bpm): 108 Weight(lbs): 331 Blood Pressure(mmHg): 144/84 Body Mass Index(BMI): Temperature(F): 98.3 Respiratory Rate(breaths/min): 20 Rossin, [name] ([id number]) [id number] Page 3 of 7 [1:Photos:] [N/A:N/A] Right Calcaneus N/A N/A Wound Location: Gradually Appeared N/A N/A Wounding Event: Diabetic Wound/Ulcer of the Lower N/A N/A Primary Etiology: Extremity Hypertension, Type II Diabetes, N/A N/A Comorbid History: Osteomyelitis 11/07/2020 N/A N/A Date Acquired: 7 N/A N/A Weeks  of Treatment: Open N/A N/A Wound Status: No N/A N/A Wound Recurrence: Yes N/A N/A Clustered Wound: 1 N/A N/A Clustered Quantity: 0.8x0.8x0.2 N/A N/A Measurements L x W x D (cm) 0.503 N/A N/A A (cm) : rea 0.101 N/A N/A Volume (cm) : 95.70% N/A N/A % Reduction in A rea: 95.70% N/A N/A % Reduction in Volume: Grade 3 N/A N/A Classification: Medium N/A N/A Exudate A mount: Serosanguineous N/A N/A Exudate Type: red, brown N/A N/A Exudate Color: Thickened N/A N/A Wound Margin: Large (67-100%) N/A N/A Granulation A mount: Red, Pink N/A N/A Granulation Quality: None Present (0%) N/A N/A Necrotic A mount: Fat Layer (Subcutaneous Tissue): Yes N/A N/A Exposed Structures: Fascia: No Tendon: No Muscle: No Joint: No Bone: No Medium (34-66%) N/A N/A Epithelialization: Callus: Yes N/A N/A Periwound Skin Texture: Excoriation: No Induration: No Crepitus: No Rash: No Scarring: No Maceration: No N/A N/A Periwound Skin Moisture: Dry/Scaly: No Atrophie Blanche: No N/A N/A Periwound Skin Color: Cyanosis: No Ecchymosis: No Erythema: No Hemosiderin Staining:  No Mottled: No Pallor: No Rubor: No No Abnormality N/A N/A Temperature: Yes N/A N/A Tenderness on Palpation: T Contact Cast otal N/A N/A Procedures Performed: Treatment Notes Electronic Signature(s) Signed: 04/18/2023 12:42:51 PM By: Geralyn Corwin DO Entered By: Geralyn Corwin on 04/18/2023 11:08:35 -------------------------------------------------------------------------------- Multi-Disciplinary Care Plan Details Patient Name: Date of Service: [name], [name] 04/18/2023 10:30 A [name], [name] ([id number]) [id number] Page 4 of 7 Medical Record Number: [id number] Patient Account Number: [id number] Date of Birth/Sex: Treating RN: [birth year] (57 y.o. [name], Yvonne Kendall Primary Care Kaleem Sartwell: [name] Other Clinician: Referring Grayden Burley: Treating Athen Riel/Extender: Harold Barban in Treatment: 18 Active Inactive Wound/Skin Impairment Nursing Diagnoses: Impaired tissue integrity Knowledge deficit related to ulceration/compromised skin integrity Goals: Patient will have a decrease in wound volume by X% from date: (specify in notes) Date Initiated: 06/12/2022 Target Resolution Date: 06/07/2023 Goal Status: Active Patient/caregiver will verbalize understanding of skin care regimen Date Initiated: 06/12/2022 Target Resolution Date: 06/07/2023 Goal Status: Active Ulcer/skin breakdown will have a volume reduction of 30% by week 4 Date Initiated: 06/12/2022 Date Inactivated: 07/31/2022 Target Resolution Date: 08/03/2022 Goal Status: Unmet Unmet Reason: dx with osteomyelitis. Ulcer/skin breakdown will have a volume reduction of 50% by week 8 Date Initiated: 06/12/2022 Date Inactivated: 10/16/2022 Target Resolution Date: 10/05/2022 Unmet Reason: see wound Goal Status: Unmet measurement. IV antibiotics Interventions: Assess patient/caregiver ability to obtain necessary supplies Assess patient/caregiver ability to perform ulcer/skin care  regimen upon admission and as needed Assess ulceration(s) every visit Notes: Electronic Signature(s) Signed: 04/18/2023 1:53:12 PM By: Shawn Stall RN, BSN Entered By: Shawn Stall on 04/18/2023 11:07:48 -------------------------------------------------------------------------------- Pain Assessment Details Patient Name: Date of Service: [name], [name] 04/18/2023 10:30 A M Medical Record Number: [id number] Patient Account Number: [id number] Date of Birth/Sex: Treating RN: [birth year] (57 y.o. [name] Primary Care  [place]: [name] Other Clinician: Referring Shawntel Farnworth: Treating Marolyn Urschel/Extender: Harold Barban in Treatment: 75 Active Problems Location of Pain Severity and Description of Pain Patient Has Paino No Site Locations [place], [place] ([id number]) [phone].pdf Page 5 of 7 Pain Management and Medication Current Pain Management: Electronic Signature(s) Signed: 04/18/2023 1:53:12 PM By: Shawn Stall RN, BSN Entered By: Shawn Stall on 04/18/2023 10:51:48 -------------------------------------------------------------------------------- Patient/Caregiver Education Details Patient Name: Date of Service: [name] 7/12/2024andnbsp10:30 A M Medical Record Number: [id number] Patient Account Number: [id number] Date of Birth/Gender: Treating RN: 10-22-1965 (57 y.o. [name] Primary Care Physician: [name] Other Clinician: Referring Physician: Treating Physician/Extender: Harold Barban  in Treatment: 81 Education Assessment Education Provided To: Patient Education Topics Provided Wound/Skin Impairment: Handouts: Caring for Your Ulcer Methods: Explain/Verbal Responses: Reinforcements needed Electronic Signature(s) Signed: 04/18/2023 1:53:12 PM By: Shawn Stall RN, BSN Entered By: Shawn Stall on 04/18/2023  11:08:01 -------------------------------------------------------------------------------- Wound Assessment Details Patient Name: Date of Service: [name], [name] 04/18/2023 10:30 A M Medical Record Number: [id number] Patient Account Number: [id number] Date of Birth/Sex: Treating RN: 1965/10/20 (57 y.o. [name] Primary Care Laraina Sulton: [name] Other Clinician: Darral [name] ([id number]) [id number] Page 6 of 7 Referring Tami Blass: Treating Gayanne Prescott/Extender: Harold Barban in Treatment: 44 Wound Status Wound Number: 1 Primary Etiology: Diabetic Wound/Ulcer of the Lower Extremity Wound Location: Right Calcaneus Wound Status: Open Wounding Event: Gradually Appeared Comorbid History: Hypertension, Type II Diabetes, Osteomyelitis Date Acquired: 11/07/2020 Weeks Of Treatment: 44 Clustered Wound: Yes Photos Wound Measurements Length: (cm) Width: (cm) Depth: (cm) Clustered Quantity: Area: (cm) Volume: (cm) 0.8 % Reduction in Area: 95.7% 0.8 % Reduction in Volume: 95.7% 0.2 Epithelialization: Medium (34-66%) 1 Tunneling: No 0.503 Undermining: No 0.101 Wound Description Classification: Grade 3 Wound Margin: Thickened Exudate Amount: Medium Exudate Type: Serosanguineous Exudate Color: red, brown Foul Odor After Cleansing: No Slough/Fibrino No Wound Bed Granulation Amount: Large (67-100%) Exposed Structure Granulation Quality: Red, Pink Fascia Exposed: No Necrotic Amount: None Present (0%) Fat Layer (Subcutaneous Tissue) Exposed: Yes Tendon Exposed: No Muscle Exposed: No Joint Exposed: No Bone Exposed: No Periwound Skin Texture Texture Color No Abnormalities Noted: No No Abnormalities Noted: No Callus: Yes Atrophie Blanche: No Crepitus: No Cyanosis: No Excoriation: No Ecchymosis: No Induration: No Erythema: No Rash: No Hemosiderin Staining: No Scarring: No Mottled: No Pallor: No Moisture Rubor: No No  Abnormalities Noted: No Dry / Scaly: No Temperature / Pain Maceration: No Temperature: No Abnormality Tenderness on Palpation: Yes Treatment Notes Wound #1 (Calcaneus) Wound Laterality: Right Cleanser Soap and Water Discharge Instruction: May shower and wash wound with dial antibacterial soap and water prior to dressing change. Wound Cleanser Discharge Instruction: Cleanse the wound with wound cleanser prior to applying a clean dressing using gauze sponges, not tissue or cotton balls. [name], [name] ([id number]) [id number] Page 7 of 7 Peri-Wound Care Sween Lotion (Moisturizing lotion) Discharge Instruction: Apply moisturizing lotion as directed Topical Primary Dressing Hydrofera Blue Ready Transfer Foam, 2.5x2.5 (in/in) Discharge Instruction: cut a small piece to place inside wound and then place Hydrafera Blue ready, a small piece on the wound.then apply another piece over the small first piece. Secondary Dressing ABD Pad, 5x9 Discharge Instruction: Apply over primary dressing as directed. Woven Gauze Sponge, Non-Sterile 4x4 in Discharge Instruction: Apply over primary dressing as directed. Secured With [employer], 4.5x3.1 (in/yd) Discharge Instruction: Secure with Kerlix as directed. 34M Medipore H Soft Cloth Surgical T ape, 4 x 10 (in/yd) Discharge Instruction: Secure with tape as directed. Compression Wrap Compression Stockings Add-Ons Electronic Signature(s) Signed: 04/18/2023 1:53:12 PM By: Shawn Stall RN, BSN Entered By: Shawn Stall on 04/18/2023 11:02:42 -------------------------------------------------------------------------------- Vitals Details Patient Name: Date of Service: [name], [name] 04/18/2023 10:30 A M Medical Record Number: [id number] Patient Account Number: [id number] Date of Birth/Sex: Treating RN: 1966/02/13 (57 y.o. [name], Millard.Loa Primary Care Lavaris Sexson: [name] Other Clinician: Referring Shevon Sian: Treating  Maiah Sinning/Extender: Harold Barban in Treatment: 58 Vital Signs Time Taken: 10:51 Temperature (F): 98.3 Weight (lbs): 331 Pulse (bpm): 108 Respiratory Rate (breaths/min): 20 Blood Pressure (mmHg): 144/84 Reference Range: 80 - 120 mg / dl Electronic Signature(s) Signed: 04/18/2023 1:53:12 PM By: Shawn Stall  RN, BSN Entered By: Shawn Stall on 04/18/2023 10:51:44

## 2023-04-18 NOTE — Progress Notes (Signed)
Spath, [name] ([id number]) [id number] Page 1 of 11 Visit Report for 04/18/2023 Chief Complaint Document Details Patient Name: Date of Service: [name], [name] 04/18/2023 10:30 A M Medical Record Number: [id number] Patient Account Number: [id number] Date of Birth/Sex: Treating RN: 07-08-1966 (57 y.o. F) Primary Care Provider: Lia [name] Other Clinician: Referring Provider: Treating Provider/Extender: Harold Barban in Treatment: 73 Information Obtained from: Patient Chief Complaint Right heel ulcer Electronic Signature(s) Signed: 04/18/2023 12:42:51 PM By: Geralyn Corwin DO Entered By: Geralyn Corwin on 04/18/2023 11:08:43 -------------------------------------------------------------------------------- HPI Details Patient Name: Date of Service: [name], [name] 04/18/2023 10:30 A M Medical Record Number: [id number] Patient Account Number: [id number] Date of Birth/Sex: Treating RN: Jan 18, 1966 (57 y.o. F) Primary Care Provider: Lia [name] Other Clinician: Referring Provider: Treating Provider/Extender: Harold Barban in Treatment: 86 History of Present Illness HPI Description: 06-13-2022 upon evaluation today patient presents for evaluation of her right heel ulcer. She is having a tremendous amount of pain at this location. She tells me that her most recent hemoglobin A1c was 8.7 and that was on 08-23-2021. This ulcer on the heel has been present she states since February 2023 when she had an injection to the heel by podiatry. She states that she began to have increasing pain the wound opened and has been open ever since. 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 She had an x-ray in June if anything can although there did not appear to be any obvious evidence of osteomyelitis at that point. She subsequently has had OR debridement several times of the wound performed by podiatry. Patient does  have a history of lymphedema of the lower extremities she is also a type II diabetic. 06-19-2022 upon evaluation today patient appears to be doing better in regard to the size of her leg which is significantly improved. With that being said she had a lot of drainage from the heel which is completely understandable considering what is going on at this time. There does not appear to be any evidence of active infection there is no warmth or irritation to the leg in general. With that being said the patient does have an issue here with the amount of drainage that is going on I definitely think Zetuvit is good to be the better way to go in place of ABD pads. Also think that she could potentially benefit from 3 times per week dressing changes but again right now we are going to stick with the to change it today, change in her Friday, and then subsequently seeing where things stand next Wednesday. The other option would be to change her to a different day for wound care having her come then say like a Tuesday Friday or Monday Thursday. 06-26-2022 upon evaluation patient's wound bed actually showed signs of doing well in regard to the overall size it was measuring slightly smaller. With that being said unfortunately the biggest issue we see is she still has a tremendous amount of drainage which is what could keep this from being able to use a total contact cast. For that reason I am did going to discuss with her today the possibility of trying Tubigrip to see if that could be of benefit for her. She voiced understanding and is in agreement with giving this a try. 07-03-2022 upon evaluation today patient unfortunately appears to be doing significantly worse at this point in regard to her swelling due to the fact that she was unable to really wear the Tubigrip. She  tells me that it was cutting into her. With that being said she also did have an MRI this MRI revealed that she does have osteomyelitis in the heel this  was actually just performed Monday, 25 September. This does show signs of "early osteomyelitis". Nonetheless this is still concerning to me. The wound also appears to be getting deeper in the center aspect of this which has me a little concerned as well. I do believe that she is likely going require an aggressive approach here to try to get things better. I discussed that in greater detail with her today. I will detailed in the plan. 07-17-2022 upon evaluation today patient appears to be doing poorly in regard to her wound. Unfortunately she does not show any signs of infection systemically though locally this seems to be doing worse with the wound actually being bigger than where it was previous. She did have an MRI that showed evidence of osteomyelitis actually recommended a referral to ID she was evaluated infectious disease and they recommended referral to podiatry and to be honest have recommended amputation based on what the patient tells me today. With that being said this is something that she is not interested in at all to be [name], [name] ([id number]) [id number] Page 2 of 11 perfectly honest. For that reason she wants to know what she should do and where she should go at this point that is where the majority of the conversation which was quite lengthy today went. Obviously understand her concern here but I think she is going to have to definitely get off this and likely this means she is going to need to come out of work. 10/18; Since the patient was the patient is using Hydrofera Blue on her right heel wound. She has a new larger open area superior to the wound. The skin on the bottom of her foot is completely macerated. I reviewed the infectious disease note on this patient from 10/9. They recommended keeping the patient on Delofloxacin 450 twice daily until follow-up tomorrow. The patient states she is not going back there as the only thing they wanted to do was  "cut my leg off]. She has not seen podiatry. Her lab works shows a CRP markedly elevated at 240.2 a sedimentation rate of 96 the rest of her CBC is normal creatinine of 0.38 The patient has been to see her primary doctor who is Dr. Leonides [name] [SPo] In [place]. According the patient Dr. Loralee [name] has ordered vancomycin and Zosyn to start on November 1. She is not taking the delofloxacin that was suggested by infectious disease. Very angry that they just wanted to consider her for an amputation although I do not specifically see that stated in their note 07-31-2022 upon evaluation today patient still has a significant wound over her plantar aspect of her right l foot region. With that being said I am definitely concerned about the fact that the patient probably does need to be in hyperbaric oxygen therapy at this point. I think we should try to get this going as quickly as possible. She actually is going to be set up with a PICC line next Wednesday and then following this we will have the vancomycin and Zosyn that she will be taking for 8 weeks according to what she tells me. I think this is definitely going to be beneficial and again the goal here is limb salvage. In combination with that I think the hyperbaric oxygen therapy would be ultimately significantly helpful for her. 08-07-2022  upon evaluation today patient appears to still be doing quite poorly overall she still has not gotten her antibiotics. She got her PICC line today which I assumed she will be getting the first dose of antibiotics as well during that time. With that being said unfortunately she did not get the antibiotics and in fact as I questioned her further she does not even know when or if that is going to be started this week. Again I am very concerned about this considering she already has a PICC line in place we do not want this to clot off on top of the fact that she should already be on these antibiotics she just never went to the ER  for evaluation therefore they never got this started. At this point based on what I am seeing I really think that the ideal thing would be for [place] to see about getting her to the ER for further evaluation and treatment to see if they can get this started for her as soon as possible. The patient voiced understanding and is in agreement with the plan. I gave her recommendations for what should be done also called [facility] infusion therapy in order to see if they would be the ones that seem to be available for her IV infusions but they tell me that they no longer do this that is apparently where the orders were sent to for her infusion therapy. That was by her primary care provider. 08-21-2022 upon evaluation today patient's wound on the bottom of the heel and foot area as well as the toe appear to be doing significantly better. Fortunately I do not see any evidence of infection locally or systemically and everything is measuring much smaller than where we have been. The wound in the left gluteal region also is dramatically improved compared to last time she was here. She has started the antibiotics this is cefepime and vancomycin. She did have a somewhat high trough and therefore they have her hold that today and they are to recheck in the morning I believe she told me. Nonetheless with the antibiotics going she seems to be doing significantly better which is great news. 08-28-2022 upon evaluation today patient appears to be doing somewhat better in regard to her foot there is definitely less drainage than what we have seen in the past. I do believe the antibiotics are helping. With that being said she is also been approved for hyperbaric oxygen therapy and I think as soon as we get this started the better. I discussed that with her today as well. With that being said I do think that the last thing she needs is her chest x-ray when she has that done will be ready to start her into treatment. She is going to  try to go get that today when she leaves which I think would be ideal. 09-11-2022 upon evaluation today patient appears to be doing well with regard to her foot ulcer. This is showing signs of no digression and she does not appear to be having as much drainage as before though it still very wet is not nearly the extreme of what it was previous. Fortunately there does not appear to be any signs of active infection locally nor systemically at this point which is great news. Patient did have her chest x-ray as well as EKG and she is actually approved and completely cleared for HBO therapy from both a clinical standpoint as well as an insurance standpoint. 09-25-2022 upon evaluation today  patient appears to be doing well in regard to her foot ulcer. Unfortunately she is not doing as well when it comes to her hyperbaric treatments. She is having a lot of trouble with claustrophobia. She tells me in general that she is not sure she is good to be able to continue. 10-09-2022 upon evaluation today patient's wounds actually are showing signs of excellent improvement which is great news. Fortunately I do not see any evidence of infection locally or systemically which is great news and overall I am extremely pleased with where things are currently. I do think that she is making good progress she does tell me at this point today she does not want to go forward with a hyperbaric oxygen therapy. 10-16-2022 upon evaluation today patient appears to be doing excellent in regard to her foot ulcer. This is actually showing signs of excellent improvement. She still does not allow for any sharp debridement but the good news is this is doing much better and does not even need it at the moment. Nonetheless as far as sharp debridement is concerned this has been too painful for really not been able to do that all along. 10-23-2022 upon evaluation today patient actually is making excellent progress. I am extremely pleased with where  we stand I have considered even seeing about putting her in the total contact cast but to be honest she is doing so well currently and still with having some drainage I feel like it is better for her to be able to change it then to be locked up in the cast for a week at a time. She has voiced understanding as well. Overall though I feel like you are making some really good progress here. 10-30-2022 upon evaluation today patient appears to be doing excellent in regard to her foot ulcers. Both are showing signs of improvement which is great news and overall I feel like that they are measuring smaller, looking better, and I see no signs of resurgence of the infection which is great news. Overall I am extremely pleased at this point. 2/7; the patient has 2 wounds which are remanence of the large wound on the right foot. This was a diabetic wound with underlying osteomyelitis she has completed antibiotics. Today she has the open area on the distal calcaneus and an area on the right midfoot which is eschared. She has been using silver alginate and offloading this in a regular running shoe. 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 11-27-2022 upon evaluation today patient appears to be doing well currently in regard to her wound which is actually showing signs of significant improvement this is great news. Fortunately there does not appear to be any signs of active infection locally nor systemically which is also excellent. No fevers, chills, nausea, vomiting, or diarrhea. 12-25-2022 upon evaluation today patient appears to be doing well currently in regard to the distal portion of her foot which is actually in my opinion looking a little better but have a lot of callus she is going require some debridement here. With that being said the heel actually looks like it might be a little bit worse. There is a lot of callus I cannot tell exactly  what is open or not we can remove that and we will see what exactly were dealing with there. With that being said she also has an area on the right side of her leg which appears  to be more of a pressure injury she tells me this is from sitting in her recliner she has gotten a pillow to help take care of this. Fortunately it is not technically open and draining at this point made it very well may be before it is also not done. 01-01-2023 upon evaluation today patient appears to be doing well currently in regard to her wounds I feel like the cefdinir has helped she seems to be improved compared to last week's evaluation. I do not see any signs of active infection locally nor systemically at this time which is great news. No fevers, chills, nausea, vomiting, or diarrhea. 01-08-2023 upon evaluation today patient's wounds on the foot appear to be doing decently well the leg is still about the same. With that being said unfortunately she does have a lot of erythema on the leg in particular which is unfortunate and definitely not what we are looking for. Again I do not think that the [facility] has really done well for her in that regard. Auker, [name] ([id number]) [id number] Page 3 of 11 01-15-2023 upon evaluation today patient appears to be doing well currently in regard to her wound. Overall I think that things are measuring a little bit better but unfortunately the erythema is spreading. I did review her culture today. Unfortunately it does show that she does have MRSA noted and again I think that we need to do something to try to get this under control as quickly as possible. She has previously been on IV antibiotics and unfortunately that has not been sufficient to get this completely cleared it seems to have come back at this point and she really wants to avoid going back on IV antibiotics. We have been using oral antibiotics and various forms the most recent was actually a course  of cefdinir. She is also previously been on clindamycin and even in the past she has had to doxycycline. With that being said with all things considered I think we are still having a lot of trouble here keeping this infection under control. We previously attempted hyperbarics but the patient did not tolerate this well. She therefore discontinued HBO therapy. 01-22-2023 upon evaluation today patient appears to be doing a little better in regard to the cellulitis on her right leg. I am much more pleased with what I am seeing today. With that being said unfortunately she is still having quite a bit of issues here with erythema though not as bad I think that were not completely clear. Unfortunately we were denied for the [facility] prescription. She has the medication but I provided for her by way of samples but nothing further. 01-29-2023 upon evaluation today patient appears to be doing poorly currently in regard to her leg. I do not feel like she is showing as much improvement as she was within desirable now that we switch her to linezolid. With that being said I do believe that we probably need to see about IV antibiotics I recommended a referral back to ID. With that being said she would prefer to see her physician which I am okay with but either way I think she needs to be back on IV antibiotics for a minimum of 2 months as soon as possible. I am going to send her for lab work today in order to get some baseline labs but honestly I think that we are going to require IV antibiotics to get this under control. 02-12-2023 upon evaluation today patient appears to be doing well  currently in regard to her wound all things considered. She has not gotten the lab work done yet that we ordered a couple weeks back. She tells me she has had a lot going on with the death of the family. Fortunately there does not appear to be any signs of worsening in fact some of the heel looks a little bit better which is great news.  Fortunately I do not see any evidence of active infection locally nor systemically which is great news. 02-19-24 upon evaluation today patient appears to be doing well currently in regard to her wounds. She has been tolerating the dressing changes without complication. Fortunately there does not appear to be any signs of active infection locally nor systemically which is great news. No fevers, chills, nausea, vomiting, or diarrhea. She is currently on vancomycin and Zosyn through IV which is doing I think well she has been on it for not quite a week at this point. 03-05-2023 upon evaluation today patient appears to be doing well currently in regard to her wound. She has been tolerating the dressing changes all 3 locations actually are significantly smaller even compared to last week I am very pleased with where we stand. I think the antibiotics are really doing a good job here for her. 03-12-2023 upon evaluation today patient has actually continued to make excellent progress in regard to her wounds. I am very pleased with where we stand in fact I think her leg is healed the middle part of her foot I think is close to being closed and the heel does look better though there is some callus that needs to be removed at this point. I did discuss that with her today. 03-19-2023 upon evaluation today patient's wounds are actually showing signs of excellent improvement. I am actually very pleased with where we stand and I think that she is making really good progress here. I do not see any signs of active infection locally nor systemically which is great news. 03-26-2023 upon evaluation today patient's wounds actually are showing signs of improvement in fact the middle of her foot is pretty much appearing to be completely healed which is great news. This means we are just dealing with the heel itself on the bottom of the foot as far as anything remaining is concerned at this point. I am extremely pleased with where  we stand at this time. 04-09-2023 upon evaluation today patient appears to be doing well currently in regard to the periwound. She has been tolerating the dressing changes without complication. Fortunately there does not appear to be any signs of active infection locally or systemically at this time. Potentially seeing about getting a total contact cast on and try to get this closed as quickly as possible. I think the casting is probably our best bet at this point and the patient is in agreement with that plan. At this point based on what I am seeing I do believe that the patient would benefit from a continuation of care with regard to dressing changes regularly. 04-16-2023 upon evaluation today patient appears to be doing well currently in regard to her wound which is actually showing signs of good improvement. Fortunately I do not see any evidence of active infection locally or systemically which is great news and in general I do believe that we are making good headway towards complete closure. I do think she is a candidate however for total contact cast which I think will speed up the closure process. 7/12; patient  presents for follow-up. She is here for our obligatory cast change. She had the cast first placed two days ago and Hydrofera Blue was under the cast. She has no issues or complaints today. Electronic Signature(s) Signed: 04/18/2023 12:42:51 PM By: Geralyn Corwin DO Entered By: Geralyn Corwin on 04/18/2023 11:09:18 -------------------------------------------------------------------------------- Physical Exam Details Patient Name: Date of Service: [name], [name] 04/18/2023 10:30 A M Medical Record Number: [id number] Patient Account Number: [id number] Date of Birth/Sex: Treating RN: 1966/06/07 (57 y.o. F) Primary Care Provider: Lia [name] Other Clinician: Referring Provider: Treating Provider/Extender: Harold Barban in Treatment:  70 Constitutional respirations regular, non-labored and within target range for patient.. Cardiovascular 2+ dorsalis pedis/posterior tibialis pulses. Psychiatric pleasant and cooperative. Folts, [name] ([id number]) [id number] Page 4 of 11 Notes T the plantar aspect of the right heel there is an open wound with granulation tissue throughout. No signs of surrounding infection. o Electronic Signature(s) Signed: 04/18/2023 12:42:51 PM By: Geralyn Corwin DO Entered By: Geralyn Corwin on 04/18/2023 11:10:52 -------------------------------------------------------------------------------- Physician Orders Details Patient Name: Date of Service: [name], [name] 04/18/2023 10:30 A M Medical Record Number: [id number] Patient Account Number: [id number] Date of Birth/Sex: Treating RN: [birth year] (57 y.o. [name], Millard.Loa Primary Care Provider: Lia [name] Other Clinician: Referring Provider: Treating Provider/Extender: Harold Barban in Treatment: 77 Verbal / Phone Orders: No Diagnosis Coding ICD-10 Coding Code Description [phone] Other chronic osteomyelitis, right ankle and foot E11.621 Type 2 diabetes mellitus with foot ulcer L97.412 Non-pressure chronic ulcer of right heel and midfoot with fat layer exposed I89.0 Lymphedema, not elsewhere classified L97.812 Non-pressure chronic ulcer of other part of right lower leg with fat layer exposed Follow-up Appointments ppointment in 1 week. Allen Derry III PA-C Wednesday 1100 room 8 04/23/2023 Return A ppointment in 2 weeks. Leonard Schwartz Wednesday room 8 0845 04/30/2023 Return A Anesthetic (In clinic) Topical Lidocaine 5% applied to wound bed Bathing/ Shower/ Hygiene May shower with protection but do not get wound dressing(s) wet. Protect dressing(s) with water repellant cover (for example, large plastic bag) or a cast cover and may then take shower. Edema Control - Lymphedema / SCD / Other Elevate  legs to the level of the heart or above for 30 minutes daily and/or when sitting for 3-4 times a day throughout the day. Avoid standing for long periods of time. Off-Loading Total Contact Cast to Right Lower Extremity - size 4 Wound Treatment Wound #1 - Calcaneus Wound Laterality: Right Cleanser: Soap and Water 1 x Per Week/30 Days Discharge Instructions: May shower and wash wound with dial antibacterial soap and water prior to dressing change. Cleanser: Wound Cleanser (Generic) 1 x Per Week/30 Days Discharge Instructions: Cleanse the wound with wound cleanser prior to applying a clean dressing using gauze sponges, not tissue or cotton balls. Peri-Wound Care: [place] Lotion (Moisturizing lotion) 1 x Per Week/30 Days Discharge Instructions: Apply moisturizing lotion as directed Prim Dressing: Hydrofera Blue Ready Transfer Foam, 2.5x2.5 (in/in) 1 x Per Week/30 Days ary Discharge Instructions: cut a small piece to place inside wound and then place Hydrafera Blue ready, a small piece on the wound.then apply another piece over the small first piece. Secondary Dressing: ABD Pad, 5x9 (Generic) 1 x Per Week/30 Days Discharge Instructions: Apply over primary dressing as directed. Secondary Dressing: Woven Gauze Sponge, Non-Sterile 4x4 in (Generic) 1 x Per Week/30 Days Discharge Instructions: Apply over primary dressing as directed. Bratcher, [name] ([id number]) [id number] Page 5 of 11 Secured With: [employer], 4.5x3.1 (in/yd) 1 x Per  Week/30 Days Discharge Instructions: Secure with Kerlix as directed. Secured With: 58M Medipore H Soft Cloth Surgical T ape, 4 x 10 (in/yd) 1 x Per Week/30 Days Discharge Instructions: Secure with tape as directed. Electronic Signature(s) Signed: 04/18/2023 12:42:51 PM By: Geralyn Corwin DO Entered By: Geralyn Corwin on 04/18/2023 11:11:01 -------------------------------------------------------------------------------- Problem List  Details Patient Name: Date of Service: BREYELLE, VONGPHAKDY 04/18/2023 10:30 A M Medical Record Number: [id number] Patient Account Number: [id number] Date of Birth/Sex: Treating RN: 1966-02-21 (57 y.o. [name], Yvonne Kendall Primary Care Provider: Lia [name] Other Clinician: Referring Provider: Treating Provider/Extender: Harold Barban in Treatment: 38 Active Problems ICD-10 Encounter Code Description Active Date MDM Diagnosis [phone] Other chronic osteomyelitis, right ankle and foot 08/28/2022 No Yes E11.621 Type 2 diabetes mellitus with foot ulcer 06/12/2022 No Yes L97.412 Non-pressure chronic ulcer of right heel and midfoot with fat layer exposed 06/12/2022 No Yes I89.0 Lymphedema, not elsewhere classified 06/12/2022 No Yes L97.812 Non-pressure chronic ulcer of other part of right lower leg with fat layer 03/05/2023 No Yes exposed Inactive Problems Resolved Problems Electronic Signature(s) Signed: 04/18/2023 12:42:51 PM By: Geralyn Corwin DO Entered By: Geralyn Corwin on 04/18/2023 11:08:29 Progress Note Details -------------------------------------------------------------------------------- [name] ([id number]) [id number] Page 6 of 11 Patient Name: Date of Service: [name], [name] 04/18/2023 10:30 A M Medical Record Number: [id number] Patient Account Number: [id number] Date of Birth/Sex: Treating RN: [birth year] (57 y.o. F) Primary Care Provider: Lia [name] Other Clinician: Referring Provider: Treating Provider/Extender: Harold Barban in Treatment: 14 Subjective Chief Complaint Information obtained from Patient Right heel ulcer History of Present Illness (HPI) 06-13-2022 upon evaluation today patient presents for evaluation of her right heel ulcer. She is having a tremendous amount of pain at this location. She tells me that her most recent hemoglobin A1c was 8.7 and that was on 08-23-2021. This ulcer on the heel  has been present she states since February 2023 when she had an injection to the heel by podiatry. She states that she began to have increasing pain the wound opened and has been open ever since. 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 She had an x-ray in June if anything can although there did not appear to be any obvious evidence of osteomyelitis at that point. She subsequently has had OR debridement several times of the wound performed by podiatry. Patient does have a history of lymphedema of the lower extremities she is also a type II diabetic. 06-19-2022 upon evaluation today patient appears to be doing better in regard to the size of her leg which is significantly improved. With that being said she had a lot of drainage from the heel which is completely understandable considering what is going on at this time. There does not appear to be any evidence of active infection there is no warmth or irritation to the leg in general. With that being said the patient does have an issue here with the amount of drainage that is going on I definitely think Zetuvit is good to be the better way to go in place of ABD pads. Also think that she could potentially benefit from 3 times per week dressing changes but again right now we are going to stick with the to change it today, change in her Friday, and then subsequently seeing where things stand next Wednesday. The other option would be to change her to a different day for wound care having her come then say like a Tuesday Friday or Monday Thursday.  06-26-2022 upon evaluation patient's wound bed actually showed signs of doing well in regard to the overall size it was measuring slightly smaller. With that being said unfortunately the biggest issue we see is she still has a tremendous amount of drainage which is what could keep this from being able to use a total contact cast. For that reason I am did going to discuss with her today the  possibility of trying Tubigrip to see if that could be of benefit for her. She voiced understanding and is in agreement with giving this a try. 07-03-2022 upon evaluation today patient unfortunately appears to be doing significantly worse at this point in regard to her swelling due to the fact that she was unable to really wear the Tubigrip. She tells me that it was cutting into her. With that being said she also did have an MRI this MRI revealed that she does have osteomyelitis in the heel this was actually just performed Monday, 25 September. This does show signs of "early osteomyelitis". Nonetheless this is still concerning to me. The wound also appears to be getting deeper in the center aspect of this which has me a little concerned as well. I do believe that she is likely going require an aggressive approach here to try to get things better. I discussed that in greater detail with her today. I will detailed in the plan. 07-17-2022 upon evaluation today patient appears to be doing poorly in regard to her wound. Unfortunately she does not show any signs of infection systemically though locally this seems to be doing worse with the wound actually being bigger than where it was previous. She did have an MRI that showed evidence of osteomyelitis actually recommended a referral to ID she was evaluated infectious disease and they recommended referral to podiatry and to be honest have recommended amputation based on what the patient tells me today. With that being said this is something that she is not interested in at all to be perfectly honest. For that reason she wants to know what she should do and where she should go at this point that is where the majority of the conversation which was quite lengthy today went. Obviously understand her concern here but I think she is going to have to definitely get off this and likely this means she is going to need to come out of work. 10/18; Since the patient was  the patient is using Hydrofera Blue on her right heel wound. She has a new larger open area superior to the wound. The skin on the bottom of her foot is completely macerated. I reviewed the infectious disease note on this patient from 10/9. They recommended keeping the patient on Delofloxacin 450 twice daily until follow-up tomorrow. The patient states she is not going back there as the only thing they wanted to do was "cut my leg off]. She has not seen podiatry. Her lab works shows a CRP markedly elevated at 240.2 a sedimentation rate of 96 the rest of her CBC is normal creatinine of 0.38 The patient has been to see her primary doctor who is Dr. Leonides [name] [SP[facility]] In [place]. According the patient Dr. Loralee [name] has ordered vancomycin and Zosyn to start on November 1. She is not taking the delofloxacin that was suggested by infectious disease. Very angry that they just wanted to consider her for an amputation although I do not specifically see that stated in their note 07-31-2022 upon evaluation today patient still has a significant wound over  her plantar aspect of her right l foot region. With that being said I am definitely concerned about the fact that the patient probably does need to be in hyperbaric oxygen therapy at this point. I think we should try to get this going as quickly as possible. She actually is going to be set up with a PICC line next Wednesday and then following this we will have the vancomycin and Zosyn that she will be taking for 8 weeks according to what she tells me. I think this is definitely going to be beneficial and again the goal here is limb salvage. In combination with that I think the hyperbaric oxygen therapy would be ultimately significantly helpful for her. 08-07-2022 upon evaluation today patient appears to still be doing quite poorly overall she still has not gotten her antibiotics. She got her PICC line today which I assumed she will be getting the first dose of antibiotics as  well during that time. With that being said unfortunately she did not get the antibiotics and in fact as I questioned her further she does not even know when or if that is going to be started this week. Again I am very concerned about this considering she already has a PICC line in place we do not want this to clot off on top of the fact that she should already be on these antibiotics she just never went to the ER for evaluation therefore they never got this started. At this point based on what I am seeing I really think that the ideal thing would be for [place] to see about getting her to the ER for further evaluation and treatment to see if they can get this started for her as soon as possible. The patient voiced understanding and is in agreement with the plan. I gave her recommendations for what should be done also called [facility] infusion therapy in order to see if they would be the ones that seem to be available for her IV infusions but they tell me that they no longer do this that is apparently where the orders were sent to for her infusion therapy. That was by her primary care provider. 08-21-2022 upon evaluation today patient's wound on the bottom of the heel and foot area as well as the toe appear to be doing significantly better. Fortunately I do not see any evidence of infection locally or systemically and everything is measuring much smaller than where we have been. The wound in the left gluteal region also is dramatically improved compared to last time she was here. She has started the antibiotics this is cefepime and vancomycin. She did have a somewhat high trough and therefore they have her hold that today and they are to recheck in the morning I believe she told me. Nonetheless with the antibiotics going she seems to be doing significantly better which is great news. 08-28-2022 upon evaluation today patient appears to be doing somewhat better in regard to her foot there is definitely less  drainage than what we have seen in the past. I do believe the antibiotics are helping. With that being said she is also been approved for hyperbaric oxygen therapy and I think as soon as we get this started the better. I discussed that with her today as well. With that being said I do think that the last thing she needs is her chest x-ray when she has that done will be ready to start her into treatment. She is going to try to  go get that today when she leaves which I think would be ideal. [name], [name] ([id number]) [phone].pdf Page 7 of 11 09-11-2022 upon evaluation today patient appears to be doing well with regard to her foot ulcer. This is showing signs of no digression and she does not appear to be having as much drainage as before though it still very wet is not nearly the extreme of what it was previous. Fortunately there does not appear to be any signs of active infection locally nor systemically at this point which is great news. Patient did have her chest x-ray as well as EKG and she is actually approved and completely cleared for HBO therapy from both a clinical standpoint as well as an insurance standpoint. 09-25-2022 upon evaluation today patient appears to be doing well in regard to her foot ulcer. Unfortunately she is not doing as well when it comes to her hyperbaric treatments. She is having a lot of trouble with claustrophobia. She tells me in general that she is not sure she is good to be able to continue. 10-09-2022 upon evaluation today patient's wounds actually are showing signs of excellent improvement which is great news. Fortunately I do not see any evidence of infection locally or systemically which is great news and overall I am extremely pleased with where things are currently. I do think that she is making good progress she does tell me at this point today she does not want to go forward with a hyperbaric oxygen therapy. 10-16-2022 upon evaluation today  patient appears to be doing excellent in regard to her foot ulcer. This is actually showing signs of excellent improvement. She still does not allow for any sharp debridement but the good news is this is doing much better and does not even need it at the moment. Nonetheless as far as sharp debridement is concerned this has been too painful for really not been able to do that all along. 10-23-2022 upon evaluation today patient actually is making excellent progress. I am extremely pleased with where we stand I have considered even seeing about putting her in the total contact cast but to be honest she is doing so well currently and still with having some drainage I feel like it is better for her to be able to change it then to be locked up in the cast for a week at a time. She has voiced understanding as well. Overall though I feel like you are making some really good progress here. 10-30-2022 upon evaluation today patient appears to be doing excellent in regard to her foot ulcers. Both are showing signs of improvement which is great news and overall I feel like that they are measuring smaller, looking better, and I see no signs of resurgence of the infection which is great news. Overall I am extremely pleased at this point. 2/7; the patient has 2 wounds which are remanence of the large wound on the right foot. This was a diabetic wound with underlying osteomyelitis she has completed antibiotics. Today she has the open area on the distal calcaneus and an area on the right midfoot which is eschared. She has been using silver alginate and offloading this in a regular running shoe. 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 11-27-2022 upon evaluation today patient appears to be doing well currently in regard to her wound which is actually showing signs of significant improvement this is great news. Fortunately there does  not appear to be any  signs of active infection locally nor systemically which is also excellent. No fevers, chills, nausea, vomiting, or diarrhea. 12-25-2022 upon evaluation today patient appears to be doing well currently in regard to the distal portion of her foot which is actually in my opinion looking a little better but have a lot of callus she is going require some debridement here. With that being said the heel actually looks like it might be a little bit worse. There is a lot of callus I cannot tell exactly what is open or not we can remove that and we will see what exactly were dealing with there. With that being said she also has an area on the right side of her leg which appears to be more of a pressure injury she tells me this is from sitting in her recliner she has gotten a pillow to help take care of this. Fortunately it is not technically open and draining at this point made it very well may be before it is also not done. 01-01-2023 upon evaluation today patient appears to be doing well currently in regard to her wounds I feel like the cefdinir has helped she seems to be improved compared to last week's evaluation. I do not see any signs of active infection locally nor systemically at this time which is great news. No fevers, chills, nausea, vomiting, or diarrhea. 01-08-2023 upon evaluation today patient's wounds on the foot appear to be doing decently well the leg is still about the same. With that being said unfortunately she does have a lot of erythema on the leg in particular which is unfortunate and definitely not what we are looking for. Again I do not think that the [facility] has really done well for her in that regard. 01-15-2023 upon evaluation today patient appears to be doing well currently in regard to her wound. Overall I think that things are measuring a little bit better but unfortunately the erythema is spreading. I did review her culture today. 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 She has previously been on IV antibiotics and unfortunately that has not been sufficient to get this completely cleared it seems to have come back at this point and she really wants to avoid going back on IV antibiotics. We have been using oral antibiotics and various forms the most recent was actually a course of cefdinir. She is also previously been on clindamycin and even in the past she has had to doxycycline. With that being said with all things considered I think we are still having a lot of trouble here keeping this infection under control. We previously attempted hyperbarics but the patient did not tolerate this well. She therefore discontinued HBO therapy. 01-22-2023 upon evaluation today patient appears to be doing a little better in regard to the cellulitis on her right leg. I am much more pleased with what I am seeing today. With that being said unfortunately she is still having quite a bit of issues here with erythema though not as bad I think that were not completely clear. Unfortunately we were denied for the [facility] prescription. She has the medication but I provided for her by way of samples but nothing further. 01-29-2023 upon evaluation today patient appears to be doing poorly currently in regard to her leg. I do not feel like she is  showing as much improvement as she was within desirable now that we switch her to linezolid. With that being said I do believe that we probably need to see about IV antibiotics I recommended a referral back to ID. With that being said she would prefer to see her physician which I am okay with but either way I think she needs to be back on IV antibiotics for a minimum of 2 months as soon as possible. I am going to send her for lab work today in order to get some baseline labs but honestly I think that we are going to require IV antibiotics to get this  under control. 02-12-2023 upon evaluation today patient appears to be doing well currently in regard to her wound all things considered. She has not gotten the lab work done yet that we ordered a couple weeks back. She tells me she has had a lot going on with the death of the family. Fortunately there does not appear to be any signs of worsening in fact some of the heel looks a little bit better which is great news. Fortunately I do not see any evidence of active infection locally nor systemically which is great news. 02-19-24 upon evaluation today patient appears to be doing well currently in regard to her wounds. She has been tolerating the dressing changes without complication. Fortunately there does not appear to be any signs of active infection locally nor systemically which is great news. No fevers, chills, nausea, vomiting, or diarrhea. She is currently on vancomycin and Zosyn through IV which is doing I think well she has been on it for not quite a week at this point. 03-05-2023 upon evaluation today patient appears to be doing well currently in regard to her wound. She has been tolerating the dressing changes all 3 locations actually are significantly smaller even compared to last week I am very pleased with where we stand. I think the antibiotics are really doing a good job here for her. 03-12-2023 upon evaluation today patient has actually continued to make excellent progress in regard to her wounds. I am very pleased with where we stand in fact I think her leg is healed the middle part of her foot I think is close to being closed and the heel does look better though there is some callus that needs to be removed at this point. I did discuss that with her today. 03-19-2023 upon evaluation today patient's wounds are actually showing signs of excellent improvement. I am actually very pleased with where we stand and I think that she is making really good progress here. I do not see any signs of active  infection locally nor systemically which is great news. 03-26-2023 upon evaluation today patient's wounds actually are showing signs of improvement in fact the middle of her foot is pretty much appearing to be completely healed which is great news. This means we are just dealing with the heel itself on the bottom of the foot as far as anything remaining is concerned at this point. I am extremely pleased with where we stand at this time. 04-09-2023 upon evaluation today patient appears to be doing well currently in regard to the periwound. She has been tolerating the dressing changes without complication. Fortunately there does not appear to be any signs of active infection locally or systemically at this time. Potentially seeing about getting a total [name], [name] ([id number]) [phone].pdf Page 8 of 11 contact cast on and try to get this closed as quickly  as possible. I think the casting is probably our best bet at this point and the patient is in agreement with that plan. At this point based on what I am seeing I do believe that the patient would benefit from a continuation of care with regard to dressing changes regularly. 04-16-2023 upon evaluation today patient appears to be doing well currently in regard to her wound which is actually showing signs of good improvement. Fortunately I do not see any evidence of active infection locally or systemically which is great news and in general I do believe that we are making good headway towards complete closure. I do think she is a candidate however for total contact cast which I think will speed up the closure process. 7/12; patient presents for follow-up. She is here for our obligatory cast change. She had the cast first placed two days ago and Hydrofera Blue was under the cast. She has no issues or complaints today. Patient History Information obtained from Patient, Chart. Family History Unknown History. Social History Never  smoker, Marital Status - Married, Alcohol Use - Never, Drug Use - No History, Caffeine Use - Rarely. Medical History Cardiovascular Patient has history of Hypertension Endocrine Patient has history of Type II Diabetes Musculoskeletal Patient has history of Osteomyelitis Hospitalization/Surgery History - right second toe amputation. - IandD right foot. - cholecystectomy. - ORIF right ankle. Medical A Surgical History Notes nd Gastrointestinal CHRONIC GERD Psychiatric DEPRESSION AND ANXIETY Objective Constitutional respirations regular, non-labored and within target range for patient.. Vitals Time Taken: 10:51 AM, Weight: 331 lbs, Temperature: 98.3 F, Pulse: 108 bpm, Respiratory Rate: 20 breaths/min, Blood Pressure: 144/84 mmHg. Cardiovascular 2+ dorsalis pedis/posterior tibialis pulses. Psychiatric pleasant and cooperative. General Notes: T the plantar aspect of the right heel there is an open wound with granulation tissue throughout. No signs of surrounding infection. o Integumentary (Hair, Skin) Wound #1 status is Open. Original cause of wound was Gradually Appeared. The date acquired was: 11/07/2020. The wound has been in treatment 44 weeks. The wound is located on the Right Calcaneus. The wound measures 0.8cm length x 0.8cm width x 0.2cm depth; 0.503cm^2 area and 0.101cm^3 volume. There is Fat Layer (Subcutaneous Tissue) exposed. There is no tunneling or undermining noted. There is a medium amount of serosanguineous drainage noted. The wound margin is thickened. There is large (67-100%) red, pink granulation within the wound bed. There is no necrotic tissue within the wound bed. The periwound skin appearance exhibited: Callus. The periwound skin appearance did not exhibit: Crepitus, Excoriation, Induration, Rash, Scarring, Dry/Scaly, Maceration, Atrophie Blanche, Cyanosis, Ecchymosis, Hemosiderin Staining, Mottled, Pallor, Rubor, Erythema. Periwound temperature was noted as No  Abnormality. The periwound has tenderness on palpation. Assessment Active Problems ICD-10 Other chronic osteomyelitis, right ankle and foot Type 2 diabetes mellitus with foot ulcer Non-pressure chronic ulcer of right heel and midfoot with fat layer exposed Lymphedema, not elsewhere classified Non-pressure chronic ulcer of other part of right lower leg with fat layer exposed [name], Damian ([id number]) [id number] Page 9 of 11 Patient presents for her cast change. Wound is stable. No signs of infection. Good granulation tissue present. I recommended continue Hydrofera Blue and the total contact cast. Follow-up in 1 week. Procedures Wound #1 Pre-procedure diagnosis of Wound #1 is a Diabetic Wound/Ulcer of the Lower Extremity located on the Right Calcaneus . There was a T Research scientist (life sciences) otal Procedure by Geralyn Corwin, DO. Post procedure Diagnosis Wound #1: Same as Pre-Procedure Notes: size 4. Plan Follow-up Appointments: Return Appointment in 1  week. Allen Derry III PA-C Wednesday 1100 room 8 04/23/2023 Return Appointment in 2 weeks. Leonard Schwartz Wednesday room 8 0845 04/30/2023 Anesthetic: (In clinic) Topical Lidocaine 5% applied to wound bed Bathing/ Shower/ Hygiene: May shower with protection but do not get wound dressing(s) wet. Protect dressing(s) with water repellant cover (for example, large plastic bag) or a cast cover and may then take shower. Edema Control - Lymphedema / SCD / Other: Elevate legs to the level of the heart or above for 30 minutes daily and/or when sitting for 3-4 times a day throughout the day. Avoid standing for long periods of time. Off-Loading: T Contact Cast to Right Lower Extremity - size 4 otal WOUND #1: - Calcaneus Wound Laterality: Right Cleanser: Soap and Water 1 x Per Week/30 Days Discharge Instructions: May shower and wash wound with dial antibacterial soap and water prior to dressing change. Cleanser: Wound Cleanser  (Generic) 1 x Per Week/30 Days Discharge Instructions: Cleanse the wound with wound cleanser prior to applying a clean dressing using gauze sponges, not tissue or cotton balls. Peri-Wound Care: Sween Lotion (Moisturizing lotion) 1 x Per Week/30 Days Discharge Instructions: Apply moisturizing lotion as directed Prim Dressing: Hydrofera Blue Ready Transfer Foam, 2.5x2.5 (in/in) 1 x Per Week/30 Days ary Discharge Instructions: cut a small piece to place inside wound and then place Hydrafera Blue ready, a small piece on the wound.then apply another piece over the small first piece. Secondary Dressing: ABD Pad, 5x9 (Generic) 1 x Per Week/30 Days Discharge Instructions: Apply over primary dressing as directed. Secondary Dressing: Woven Gauze Sponge, Non-Sterile 4x4 in (Generic) 1 x Per Week/30 Days Discharge Instructions: Apply over primary dressing as directed. Secured With: [employer], 4.5x3.1 (in/yd) 1 x Per Week/30 Days Discharge Instructions: Secure with Kerlix as directed. Secured With: 74M Medipore H Soft Cloth Surgical T ape, 4 x 10 (in/yd) 1 x Per Week/30 Days Discharge Instructions: Secure with tape as directed. 1. Hydrofera Blue 2. T contact cast placed in standard fashionright lower extremity otal 3. Follow-up in 1 week Electronic Signature(s) Signed: 04/18/2023 12:42:51 PM By: Geralyn Corwin DO Entered By: Geralyn Corwin on 04/18/2023 11:11:40 -------------------------------------------------------------------------------- HxROS Details Patient Name: Date of Service: [name], [name] 04/18/2023 10:30 A M Medical Record Number: [id number] Patient Account Number: [id number] Date of Birth/Sex: Treating RN: 19-May-1966 (57 y.o. F) Primary Care Provider: Lia [name] Other Clinician: Referring Provider: Treating Provider/Extender: Harold Barban in Treatment: 23 Information Obtained From Patient Chart [name], [name] ([id number])  [id number] Page 10 of 11 Cardiovascular Medical History: Positive for: Hypertension Gastrointestinal Medical History: Past Medical History Notes: CHRONIC GERD Endocrine Medical History: Positive for: Type II Diabetes Musculoskeletal Medical History: Positive for: Osteomyelitis Psychiatric Medical History: Past Medical History Notes: DEPRESSION AND ANXIETY Immunizations Pneumococcal Vaccine: Received Pneumococcal Vaccination: No Implantable Devices None Hospitalization / Surgery History Type of Hospitalization/Surgery right second toe amputation IandD right foot cholecystectomy ORIF right ankle Family and Social History Unknown History: Yes; Never smoker; Marital Status - Married; Alcohol Use: Never; Drug Use: No History; Caffeine Use: Rarely; Financial Concerns: No; Food, Clothing or Shelter Needs: No; Support System Lacking: No; Transportation Concerns: No Electronic Signature(s) Signed: 04/18/2023 12:42:51 PM By: Geralyn Corwin DO Entered By: Geralyn Corwin on 04/18/2023 11:10:00 -------------------------------------------------------------------------------- Total Contact Cast Details Patient Name: Date of Service: YAMILA, GERSCH 04/18/2023 10:30 A M Medical Record Number: [id number] Patient Account Number: [id number] Date of Birth/Sex: Treating RN: January 01, 1966 (57 y.o. [name] Primary Care Provider: Lia [name] Other Clinician: Referring Provider:  Treating Provider/Extender: Edd Arbour Weeks in Treatment: 83 T Contact Cast Applied for Wound Assessment: otal Wound #1 Right Calcaneus Performed By: Physician Geralyn Corwin, DO Post Procedure Diagnosis Same as Pre-procedure Notes [place], Aliyanah ([id number]) [id number] Page 11 of 11 size 4 Electronic Signature(s) Signed: 04/18/2023 12:42:51 PM By: Geralyn Corwin DO Signed: 04/18/2023 1:53:12 PM By: Shawn Stall RN, BSN Entered  By: Shawn Stall on 04/18/2023 11:07:10 -------------------------------------------------------------------------------- SuperBill Details Patient Name: Date of Service: WENDELIN, FRANGIPANE 04/18/2023 Medical Record Number: [id number] Patient Account Number: [id number] Date of Birth/Sex: Treating RN: 09-05-1966 (57 y.o. [name], Millard.Loa Primary Care Provider: Lia [name] Other Clinician: Referring Provider: Treating Provider/Extender: Harold Barban in Treatment: 36 Diagnosis Coding ICD-10 Codes Code Description [phone] Other chronic osteomyelitis, right ankle and foot E11.621 Type 2 diabetes mellitus with foot ulcer L97.412 Non-pressure chronic ulcer of right heel and midfoot with fat layer exposed I89.0 Lymphedema, not elsewhere classified L97.812 Non-pressure chronic ulcer of other part of right lower leg with fat layer exposed Facility Procedures : CPT4 Code: [id number] Description: 29445 - APPLY TOTAL CONTACT LEG CAST ICD-10 Diagnosis Description L97.412 Non-pressure chronic ulcer of right heel and midfoot with fat layer exposed E11.621 Type 2 diabetes mellitus with foot ulcer Modifier: Quantity: 1 Physician Procedures : CPT4 Code Description Modifier [id number] 29445 - WC PHYS APPLY TOTAL CONTACT CAST ICD-10 Diagnosis Description L97.412 Non-pressure chronic ulcer of right heel and midfoot with fat layer exposed E11.621 Type 2 diabetes mellitus with foot ulcer Quantity: 1 Electronic Signature(s) Signed: 04/18/2023 12:42:51 PM By: Geralyn Corwin DO Entered By: Geralyn Corwin on 04/18/2023 11:11:51

## 2023-04-23 ENCOUNTER — Encounter (HOSPITAL_BASED_OUTPATIENT_CLINIC_OR_DEPARTMENT_OTHER): Payer: No Typology Code available for payment source | Admitting: Physician Assistant

## 2023-04-23 DIAGNOSIS — E11621 Type 2 diabetes mellitus with foot ulcer: Secondary | ICD-10-CM | POA: Diagnosis not present

## 2023-04-23 NOTE — Progress Notes (Addendum)
Goecke, [name] ([id number]) [id number] Page 1 of 10 Visit Report for 04/23/2023 Chief Complaint Document Details Patient Name: Date of Service: [name], [name] 04/23/2023 9:30 A M Medical Record Number: [id number] Patient Account Number: [id number] Date of Birth/Sex: Treating RN: Dec 06, 1965 (57 y.o. F) Primary Care Provider: [name] Other Clinician: Referring Provider: Treating Provider/Extender: [name] in Treatment: 45 Information Obtained from: Patient Chief Complaint Right heel ulcer Electronic Signature(s) Signed: 04/23/2023 9:42:08 AM By: Allen Derry PA-C Entered By: Allen Derry on 04/23/2023 09:42:08 -------------------------------------------------------------------------------- HPI Details Patient Name: Date of Service: [name], [name] 04/23/2023 9:30 A M Medical Record Number: [id number] Patient Account Number: [id number] Date of Birth/Sex: Treating RN: June 28, 1966 (57 y.o. F) Primary Care Provider: [name] Other Clinician: Referring Provider: Treating Provider/Extender: [name] in Treatment: 45 History of Present Illness HPI Description: 06-13-2022 upon evaluation today patient presents for evaluation of her right heel ulcer. She is having a tremendous amount of pain at this location. She tells me that her most recent hemoglobin A1c was 8.7 and that was on 08-23-2021. This ulcer on the heel has been present she states since February 2023 when she had an injection to the heel by podiatry. She states that she began to have increasing pain the wound opened and has been open ever since. 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 She had an x-ray in June if anything can although there did not appear to be any obvious evidence of osteomyelitis at that point. She subsequently has had OR debridement several times of the wound performed by podiatry. Patient does have a history  of lymphedema of the lower extremities she is also a type II diabetic. 06-19-2022 upon evaluation today patient appears to be doing better in regard to the size of her leg which is significantly improved. With that being said she had a lot of drainage from the heel which is completely understandable considering what is going on at this time. There does not appear to be any evidence of active infection there is no warmth or irritation to the leg in general. With that being said the patient does have an issue here with the amount of drainage that is going on I definitely think Zetuvit is good to be the better way to go in place of ABD pads. Also think that she could potentially benefit from 3 times per week dressing changes but again right now we are going to stick with the to change it today, change in her Friday, and then subsequently seeing where things stand next Wednesday. The other option would be to change her to a different day for wound care having her come then say like a Tuesday Friday or Monday Thursday. 06-26-2022 upon evaluation patient's wound bed actually showed signs of doing well in regard to the overall size it was measuring slightly smaller. With that being said unfortunately the biggest issue we see is she still has a tremendous amount of drainage which is what could keep this from being able to use a total contact cast. For that reason I am did going to discuss with her today the possibility of trying Tubigrip to see if that could be of benefit for her. She voiced understanding and is in agreement with giving this a try. 07-03-2022 upon evaluation today patient unfortunately appears to be doing significantly worse at this point in regard to her swelling due to the fact that she was unable to really wear the  Tubigrip. She tells me that it was cutting into her. With that being said she also did have an MRI this MRI revealed that she does have osteomyelitis in the heel this was actually  just performed Monday, 25 September. This does show signs of "early osteomyelitis". Nonetheless this is still concerning to me. The wound also appears to be getting deeper in the center aspect of this which has me a little concerned as well. I do believe that she is likely going require an aggressive approach here to try to get things better. I discussed that in greater detail with her today. I will detailed in the plan. 07-17-2022 upon evaluation today patient appears to be doing poorly in regard to her wound. Unfortunately she does not show any signs of infection systemically though locally this seems to be doing worse with the wound actually being bigger than where it was previous. She did have an MRI that showed evidence of osteomyelitis actually recommended a referral to ID she was evaluated infectious disease and they recommended referral to podiatry and to be honest have recommended amputation based on what the patient tells me today. With that being said this is something that she is not interested in at all to be Daino, Emali ([id number]) [phone].pdf Page 2 of 10 perfectly honest. For that reason she wants to know what she should do and where she should go at this point that is where the majority of the conversation which was quite lengthy today went. Obviously understand her concern here but I think she is going to have to definitely get off this and likely this means she is going to need to come out of work. 10/18; Since the patient was the patient is using Hydrofera Blue on her right heel wound. She has a new larger open area superior to the wound. The skin on the bottom of her foot is completely macerated. I reviewed the infectious disease note on this patient from 10/9. They recommended keeping the patient on Delofloxacin 450 twice daily until follow-up tomorrow. The patient states she is not going back there as the only thing they wanted to do was "cut my leg  off]. She has not seen podiatry. Her lab works shows a CRP markedly elevated at 240.2 a sedimentation rate of 96 the rest of her CBC is normal creatinine of 0.38 The patient has been to see her primary doctor who is Dr. Leonides Schanz [SP[facility]] In [place]. According the patient Dr. Loralee Pacas has ordered vancomycin and Zosyn to start on November 1. She is not taking the delofloxacin that was suggested by infectious disease. Very angry that they just wanted to consider her for an amputation although I do not specifically see that stated in their note 07-31-2022 upon evaluation today patient still has a significant wound over her plantar aspect of her right l foot region. With that being said I am definitely concerned about the fact that the patient probably does need to be in hyperbaric oxygen therapy at this point. I think we should try to get this going as quickly as possible. She actually is going to be set up with a PICC line next Wednesday and then following this we will have the vancomycin and Zosyn that she will be taking for 8 weeks according to what she tells me. I think this is definitely going to be beneficial and again the goal here is limb salvage. In combination with that I think the hyperbaric oxygen therapy would be ultimately significantly helpful for  her. 08-07-2022 upon evaluation today patient appears to still be doing quite poorly overall she still has not gotten her antibiotics. She got her PICC line today which I assumed she will be getting the first dose of antibiotics as well during that time. With that being said unfortunately she did not get the antibiotics and in fact as I questioned her further she does not even know when or if that is going to be started this week. Again I am very concerned about this considering she already has a PICC line in place we do not want this to clot off on top of the fact that she should already be on these antibiotics she just never went to the ER for evaluation  therefore they never got this started. At this point based on what I am seeing I really think that the ideal thing would be for [place] to see about getting her to the ER for further evaluation and treatment to see if they can get this started for her as soon as possible. The patient voiced understanding and is in agreement with the plan. I gave her recommendations for what should be done also called [facility] infusion therapy in order to see if they would be the ones that seem to be available for her IV infusions but they tell me that they no longer do this that is apparently where the orders were sent to for her infusion therapy. That was by her primary care provider. 08-21-2022 upon evaluation today patient's wound on the bottom of the heel and foot area as well as the toe appear to be doing significantly better. Fortunately I do not see any evidence of infection locally or systemically and everything is measuring much smaller than where we have been. The wound in the left gluteal region also is dramatically improved compared to last time she was here. She has started the antibiotics this is cefepime and vancomycin. She did have a somewhat high trough and therefore they have her hold that today and they are to recheck in the morning I believe she told me. Nonetheless with the antibiotics going she seems to be doing significantly better which is great news. 08-28-2022 upon evaluation today patient appears to be doing somewhat better in regard to her foot there is definitely less drainage than what we have seen in the past. I do believe the antibiotics are helping. With that being said she is also been approved for hyperbaric oxygen therapy and I think as soon as we get this started the better. I discussed that with her today as well. With that being said I do think that the last thing she needs is her chest x-ray when she has that done will be ready to start her into treatment. She is going to try to go get  that today when she leaves which I think would be ideal. 09-11-2022 upon evaluation today patient appears to be doing well with regard to her foot ulcer. This is showing signs of no digression and she does not appear to be having as much drainage as before though it still very wet is not nearly the extreme of what it was previous. Fortunately there does not appear to be any signs of active infection locally nor systemically at this point which is great news. Patient did have her chest x-ray as well as EKG and she is actually approved and completely cleared for HBO therapy from both a clinical standpoint as well as an insurance standpoint. 09-25-2022 upon  evaluation today patient appears to be doing well in regard to her foot ulcer. Unfortunately she is not doing as well when it comes to her hyperbaric treatments. She is having a lot of trouble with claustrophobia. She tells me in general that she is not sure she is good to be able to continue. 10-09-2022 upon evaluation today patient's wounds actually are showing signs of excellent improvement which is great news. Fortunately I do not see any evidence of infection locally or systemically which is great news and overall I am extremely pleased with where things are currently. I do think that she is making good progress she does tell me at this point today she does not want to go forward with a hyperbaric oxygen therapy. 10-16-2022 upon evaluation today patient appears to be doing excellent in regard to her foot ulcer. This is actually showing signs of excellent improvement. She still does not allow for any sharp debridement but the good news is this is doing much better and does not even need it at the moment. Nonetheless as far as sharp debridement is concerned this has been too painful for really not been able to do that all along. 10-23-2022 upon evaluation today patient actually is making excellent progress. I am extremely pleased with where we stand I  have considered even seeing about putting her in the total contact cast but to be honest she is doing so well currently and still with having some drainage I feel like it is better for her to be able to change it then to be locked up in the cast for a week at a time. She has voiced understanding as well. Overall though I feel like you are making some really good progress here. 10-30-2022 upon evaluation today patient appears to be doing excellent in regard to her foot ulcers. Both are showing signs of improvement which is great news and overall I feel like that they are measuring smaller, looking better, and I see no signs of resurgence of the infection which is great news. Overall I am extremely pleased at this point. 2/7; the patient has 2 wounds which are remanence of the large wound on the right foot. This was a diabetic wound with underlying osteomyelitis she has completed antibiotics. Today she has the open area on the distal calcaneus and an area on the right midfoot which is eschared. She has been using silver alginate and offloading this in a regular running shoe. 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 11-27-2022 upon evaluation today patient appears to be doing well currently in regard to her wound which is actually showing signs of significant improvement this is great news. Fortunately there does not appear to be any signs of active infection locally nor systemically which is also excellent. No fevers, chills, nausea, vomiting, or diarrhea. 12-25-2022 upon evaluation today patient appears to be doing well currently in regard to the distal portion of her foot which is actually in my opinion looking a little better but have a lot of callus she is going require some debridement here. With that being said the heel actually looks like it might be a little bit worse. There is a lot of callus I cannot tell exactly what is open  or not we can remove that and we will see what exactly were dealing with there. With that being said she also has an area on the right side of her leg  which appears to be more of a pressure injury she tells me this is from sitting in her recliner she has gotten a pillow to help take care of this. Fortunately it is not technically open and draining at this point made it very well may be before it is also not done. 01-01-2023 upon evaluation today patient appears to be doing well currently in regard to her wounds I feel like the cefdinir has helped she seems to be improved compared to last week's evaluation. I do not see any signs of active infection locally nor systemically at this time which is great news. No fevers, chills, nausea, vomiting, or diarrhea. 01-08-2023 upon evaluation today patient's wounds on the foot appear to be doing decently well the leg is still about the same. With that being said unfortunately she does have a lot of erythema on the leg in particular which is unfortunate and definitely not what we are looking for. Again I do not think that the [facility] has really done well for her in that regard. Lippe, [name] ([id number]) [id number] Page 3 of 10 01-15-2023 upon evaluation today patient appears to be doing well currently in regard to her wound. Overall I think that things are measuring a little bit better but unfortunately the erythema is spreading. I did review her culture today. Unfortunately it does show that she does have MRSA noted and again I think that we need to do something to try to get this under control as quickly as possible. She has previously been on IV antibiotics and unfortunately that has not been sufficient to get this completely cleared it seems to have come back at this point and she really wants to avoid going back on IV antibiotics. We have been using oral antibiotics and various forms the most recent was actually a course of cefdinir.  She is also previously been on clindamycin and even in the past she has had to doxycycline. With that being said with all things considered I think we are still having a lot of trouble here keeping this infection under control. We previously attempted hyperbarics but the patient did not tolerate this well. She therefore discontinued HBO therapy. 01-22-2023 upon evaluation today patient appears to be doing a little better in regard to the cellulitis on her right leg. I am much more pleased with what I am seeing today. With that being said unfortunately she is still having quite a bit of issues here with erythema though not as bad I think that were not completely clear. Unfortunately we were denied for the [facility] prescription. She has the medication but I provided for her by way of samples but nothing further. 01-29-2023 upon evaluation today patient appears to be doing poorly currently in regard to her leg. I do not feel like she is showing as much improvement as she was within desirable now that we switch her to linezolid. With that being said I do believe that we probably need to see about IV antibiotics I recommended a referral back to ID. With that being said she would prefer to see her physician which I am okay with but either way I think she needs to be back on IV antibiotics for a minimum of 2 months as soon as possible. I am going to send her for lab work today in order to get some baseline labs but honestly I think that we are going to require IV antibiotics to get this under control. 02-12-2023 upon evaluation today patient appears to be  doing well currently in regard to her wound all things considered. She has not gotten the lab work done yet that we ordered a couple weeks back. She tells me she has had a lot going on with the death of the family. Fortunately there does not appear to be any signs of worsening in fact some of the heel looks a little bit better which is great news. Fortunately I do  not see any evidence of active infection locally nor systemically which is great news. 02-19-24 upon evaluation today patient appears to be doing well currently in regard to her wounds. She has been tolerating the dressing changes without complication. Fortunately there does not appear to be any signs of active infection locally nor systemically which is great news. No fevers, chills, nausea, vomiting, or diarrhea. She is currently on vancomycin and Zosyn through IV which is doing I think well she has been on it for not quite a week at this point. 03-05-2023 upon evaluation today patient appears to be doing well currently in regard to her wound. She has been tolerating the dressing changes all 3 locations actually are significantly smaller even compared to last week I am very pleased with where we stand. I think the antibiotics are really doing a good job here for her. 03-12-2023 upon evaluation today patient has actually continued to make excellent progress in regard to her wounds. I am very pleased with where we stand in fact I think her leg is healed the middle part of her foot I think is close to being closed and the heel does look better though there is some callus that needs to be removed at this point. I did discuss that with her today. 03-19-2023 upon evaluation today patient's wounds are actually showing signs of excellent improvement. I am actually very pleased with where we stand and I think that she is making really good progress here. I do not see any signs of active infection locally nor systemically which is great news. 03-26-2023 upon evaluation today patient's wounds actually are showing signs of improvement in fact the middle of her foot is pretty much appearing to be completely healed which is great news. This means we are just dealing with the heel itself on the bottom of the foot as far as anything remaining is concerned at this point. I am extremely pleased with where we stand at this  time. 04-09-2023 upon evaluation today patient appears to be doing well currently in regard to the periwound. She has been tolerating the dressing changes without complication. Fortunately there does not appear to be any signs of active infection locally or systemically at this time. Potentially seeing about getting a total contact cast on and try to get this closed as quickly as possible. I think the casting is probably our best bet at this point and the patient is in agreement with that plan. At this point based on what I am seeing I do believe that the patient would benefit from a continuation of care with regard to dressing changes regularly. 04-16-2023 upon evaluation today patient appears to be doing well currently in regard to her wound which is actually showing signs of good improvement. Fortunately I do not see any evidence of active infection locally or systemically which is great news and in general I do believe that we are making good headway towards complete closure. I do think she is a candidate however for total contact cast which I think will speed up the closure process.  7/12; patient presents for follow-up. She is here for our obligatory cast change. She had the cast first placed two days ago and Hydrofera Blue was under the cast. She has no issues or complaints today. 04-23-2023 upon evaluation today patient appears to be doing well currently in regard to her wound. She is actually showing signs of excellent improvement and in general I feel like that we are making great headway towards closure. Her wound is significantly improved compared to last week when I saw her the cast is doing great despite the fact got so loose that today the cast actually slipped off without [place] even having to cut it with a cast saw. Obviously she is lost quite a bit of fluid in fact we did for inch cast last time she is definitely going to be in the 3 today she has lost a tremendous amount of fluid in this leg  which is actually really good news. I do not see any signs of active infection locally nor systemically at this point. Electronic Signature(s) Signed: 04/23/2023 10:28:55 AM By: Allen Derry PA-C Entered By: Allen Derry on 04/23/2023 10:28:55 -------------------------------------------------------------------------------- Physical Exam Details Patient Name: Date of Service: TKAI, TILEY 04/23/2023 9:30 A M Medical Record Number: [id number] Patient Account Number: [id number] Date of Birth/Sex: Treating RN: Dec 29, 1965 (57 y.o. F) Primary Care Provider: [name] Other Clinician: Referring Provider: Treating Provider/Extender: [name] in Treatment: 45 Constitutional Obese and well-hydrated in no acute distress. Comrie, [name] ([id number]) [id number] Page 4 of 10 Respiratory normal breathing without difficulty. Psychiatric this patient is able to make decisions and demonstrates good insight into disease process. Alert and Oriented x 3. pleasant and cooperative. Notes Upon inspection patient's wound bed actually showed signs of excellent granulation epithelization at this point. Fortunately I do not see any evidence of worsening I do believe the total contact cast is helping and I think it will help even more now that we will have a smaller and more appropriate size on her that we will not be able to slip around and slide off just with tugging on it. Fortunately I think that she is really doing quite well. Electronic Signature(s) Signed: 04/23/2023 10:29:25 AM By: Allen Derry PA-C Entered By: Allen Derry on 04/23/2023 10:29:25 -------------------------------------------------------------------------------- Physician Orders Details Patient Name: Date of Service: [name], [name] 04/23/2023 9:30 A M Medical Record Number: [id number] Patient Account Number: [id number] Date of Birth/Sex: Treating RN: 16-Oct-1965 (57 y.o. [name] Primary Care Provider: [name] Other Clinician: Referring Provider: Treating Provider/Extender: [name] in Treatment: 20 Verbal / Phone Orders: No Diagnosis Coding ICD-10 Coding Code Description [id number] Other chronic osteomyelitis, right ankle and foot E11.621 Type 2 diabetes mellitus with foot ulcer L97.412 Non-pressure chronic ulcer of right heel and midfoot with fat layer exposed I89.0 Lymphedema, not elsewhere classified L97.812 Non-pressure chronic ulcer of other part of right lower leg with fat layer exposed Follow-up Appointments ppointment in 1 week. Allen Derry III PA-C Wednesday 04/30/23 at 8:45am Return A Anesthetic (In clinic) Topical Lidocaine 5% applied to wound bed Bathing/ Shower/ Hygiene May shower with protection but do not get wound dressing(s) wet. Protect dressing(s) with water repellant cover (for example, large plastic bag) or a cast cover and may then take shower. Edema Control - Lymphedema / SCD / Other Elevate legs to the level of the heart or above for 30 minutes daily and/or when sitting for 3-4 times a day throughout the day. Avoid standing  for long periods of time. Off-Loading Total Contact Cast to Right Lower Extremity - TCC #3 Wound Treatment Wound #1 - Calcaneus Wound Laterality: Right Cleanser: Soap and Water 1 x Per Week/30 Days Discharge Instructions: May shower and wash wound with dial antibacterial soap and water prior to dressing change. Cleanser: Wound Cleanser (Generic) 1 x Per Week/30 Days Discharge Instructions: Cleanse the wound with wound cleanser prior to applying a clean dressing using gauze sponges, not tissue or cotton balls. Peri-Wound Care: Sween Lotion (Moisturizing lotion) 1 x Per Week/30 Days Discharge Instructions: Apply moisturizing lotion as directed Prim Dressing: Promogran Prisma Matrix, 4.34 (sq in) (silver collagen) 1 x Per Week/30 Days ary Discharge Instructions: Moisten  collagen with saline or hydrogel Secondary Dressing: Woven Gauze Sponge, Non-Sterile 4x4 in (Generic) 1 x Per Week/30 Days Adger, Yenny ([id number]) [id number] Page 5 of 10 Discharge Instructions: Apply over primary dressing as directed. Secondary Dressing: Zetuvit Plus 4x8 in 1 x Per Week/30 Days Discharge Instructions: Apply over primary dressing as directed. Secured With: [employer], 4.5x3.1 (in/yd) 1 x Per Week/30 Days Discharge Instructions: Secure with Kerlix as directed. Secured With: 87M Medipore H Soft Cloth Surgical T ape, 4 x 10 (in/yd) 1 x Per Week/30 Days Discharge Instructions: Secure with tape as directed. Electronic Signature(s) Signed: 04/23/2023 4:26:17 PM By: Allen Derry PA-C Signed: 04/23/2023 5:37:42 PM By: Karie Schwalbe RN Entered By: Karie Schwalbe on 04/23/2023 10:25:01 -------------------------------------------------------------------------------- Problem List Details Patient Name: Date of Service: ZAYLANI, DURMAN 04/23/2023 9:30 A M Medical Record Number: [id number] Patient Account Number: [id number] Date of Birth/Sex: Treating RN: Feb 18, 1966 (57 y.o. F) Primary Care Provider: [name] Other Clinician: Referring Provider: Treating Provider/Extender: [name] in Treatment: 55 Active Problems ICD-10 Encounter Code Description Active Date MDM Diagnosis [phone] Other chronic osteomyelitis, right ankle and foot 08/28/2022 No Yes E11.621 Type 2 diabetes mellitus with foot ulcer 06/12/2022 No Yes L97.412 Non-pressure chronic ulcer of right heel and midfoot with fat layer exposed 06/12/2022 No Yes I89.0 Lymphedema, not elsewhere classified 06/12/2022 No Yes L97.812 Non-pressure chronic ulcer of other part of right lower leg with fat layer 03/05/2023 No Yes exposed Inactive Problems Resolved Problems Electronic Signature(s) Signed: 04/23/2023 9:42:03 AM By: Allen Derry PA-C Entered By: Allen Derry on  04/23/2023 09:42:03 Koehn, Ileen ([id number]) [id number] Page 6 of 10 -------------------------------------------------------------------------------- Progress Note Details Patient Name: Date of Service: [name], [name] 04/23/2023 9:30 A M Medical Record Number: [id number] Patient Account Number: [id number] Date of Birth/Sex: Treating RN: July 16, 1966 (57 y.o. F) Primary Care Provider: [name] Other Clinician: Referring Provider: Treating Provider/Extender: [name] in Treatment: 45 Subjective Chief Complaint Information obtained from Patient Right heel ulcer History of Present Illness (HPI) 06-13-2022 upon evaluation today patient presents for evaluation of her right heel ulcer. She is having a tremendous amount of pain at this location. She tells me that her most recent hemoglobin A1c was 8.7 and that was on 08-23-2021. This ulcer on the heel has been present she states since February 2023 when she had an injection to the heel by podiatry. She states that she began to have increasing pain the wound opened and has been open ever since. 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 She had an x-ray in June if anything can although there did not appear to be any obvious evidence of osteomyelitis at that point. She subsequently has had OR debridement several times of the wound performed by podiatry. Patient does have  a history of lymphedema of the lower extremities she is also a type II diabetic. 06-19-2022 upon evaluation today patient appears to be doing better in regard to the size of her leg which is significantly improved. With that being said she had a lot of drainage from the heel which is completely understandable considering what is going on at this time. There does not appear to be any evidence of active infection there is no warmth or irritation to the leg in general. With that being said the patient does  have an issue here with the amount of drainage that is going on I definitely think Zetuvit is good to be the better way to go in place of ABD pads. Also think that she could potentially benefit from 3 times per week dressing changes but again right now we are going to stick with the to change it today, change in her Friday, and then subsequently seeing where things stand next Wednesday. The other option would be to change her to a different day for wound care having her come then say like a Tuesday Friday or Monday Thursday. 06-26-2022 upon evaluation patient's wound bed actually showed signs of doing well in regard to the overall size it was measuring slightly smaller. With that being said unfortunately the biggest issue we see is she still has a tremendous amount of drainage which is what could keep this from being able to use a total contact cast. For that reason I am did going to discuss with her today the possibility of trying Tubigrip to see if that could be of benefit for her. She voiced understanding and is in agreement with giving this a try. 07-03-2022 upon evaluation today patient unfortunately appears to be doing significantly worse at this point in regard to her swelling due to the fact that she was unable to really wear the Tubigrip. She tells me that it was cutting into her. With that being said she also did have an MRI this MRI revealed that she does have osteomyelitis in the heel this was actually just performed Monday, 25 September. This does show signs of "early osteomyelitis". Nonetheless this is still concerning to me. The wound also appears to be getting deeper in the center aspect of this which has me a little concerned as well. I do believe that she is likely going require an aggressive approach here to try to get things better. I discussed that in greater detail with her today. I will detailed in the plan. 07-17-2022 upon evaluation today patient appears to be doing poorly in  regard to her wound. Unfortunately she does not show any signs of infection systemically though locally this seems to be doing worse with the wound actually being bigger than where it was previous. She did have an MRI that showed evidence of osteomyelitis actually recommended a referral to ID she was evaluated infectious disease and they recommended referral to podiatry and to be honest have recommended amputation based on what the patient tells me today. With that being said this is something that she is not interested in at all to be perfectly honest. For that reason she wants to know what she should do and where she should go at this point that is where the majority of the conversation which was quite lengthy today went. Obviously understand her concern here but I think she is going to have to definitely get off this and likely this means she is going to need to come out of work.  10/18; Since the patient was the patient is using Hydrofera Blue on her right heel wound. She has a new larger open area superior to the wound. The skin on the bottom of her foot is completely macerated. I reviewed the infectious disease note on this patient from 10/9. They recommended keeping the patient on Delofloxacin 450 twice daily until follow-up tomorrow. The patient states she is not going back there as the only thing they wanted to do was "cut my leg off]. She has not seen podiatry. Her lab works shows a CRP markedly elevated at 240.2 a sedimentation rate of 96 the rest of her CBC is normal creatinine of 0.38 The patient has been to see her primary doctor who is Dr. Leonides Schanz [[facility]] In [place]. According the patient Dr. Loralee Pacas has ordered vancomycin and Zosyn to start on November 1. She is not taking the delofloxacin that was suggested by infectious disease. Very angry that they just wanted to consider her for an amputation although I do not specifically see that stated in their note 07-31-2022 upon evaluation today  patient still has a significant wound over her plantar aspect of her right l foot region. With that being said I am definitely concerned about the fact that the patient probably does need to be in hyperbaric oxygen therapy at this point. I think we should try to get this going as quickly as possible. She actually is going to be set up with a PICC line next Wednesday and then following this we will have the vancomycin and Zosyn that she will be taking for 8 weeks according to what she tells me. I think this is definitely going to be beneficial and again the goal here is limb salvage. In combination with that I think the hyperbaric oxygen therapy would be ultimately significantly helpful for her. 08-07-2022 upon evaluation today patient appears to still be doing quite poorly overall she still has not gotten her antibiotics. She got her PICC line today which I assumed she will be getting the first dose of antibiotics as well during that time. With that being said unfortunately she did not get the antibiotics and in fact as I questioned her further she does not even know when or if that is going to be started this week. Again I am very concerned about this considering she already has a PICC line in place we do not want this to clot off on top of the fact that she should already be on these antibiotics she just never went to the ER for evaluation therefore they never got this started. At this point based on what I am seeing I really think that the ideal thing would be for [place] to see about getting her to the ER for further evaluation and treatment to see if they can get this started for her as soon as possible. The patient voiced understanding and is in agreement with the plan. I gave her recommendations for what should be done also called [facility] infusion therapy in order to see if they would be the ones that seem to be available for her IV infusions but they tell me that they no longer do this that is apparently  where the orders were sent to for her infusion therapy. That was by her primary care provider. 08-21-2022 upon evaluation today patient's wound on the bottom of the heel and foot area as well as the toe appear to be doing significantly better. Fortunately Fite, Nanako ([id number]) [id number] Page 7 of 10  I do not see any evidence of infection locally or systemically and everything is measuring much smaller than where we have been. The wound in the left gluteal region also is dramatically improved compared to last time she was here. She has started the antibiotics this is cefepime and vancomycin. She did have a somewhat high trough and therefore they have her hold that today and they are to recheck in the morning I believe she told me. Nonetheless with the antibiotics going she seems to be doing significantly better which is great news. 08-28-2022 upon evaluation today patient appears to be doing somewhat better in regard to her foot there is definitely less drainage than what we have seen in the past. I do believe the antibiotics are helping. With that being said she is also been approved for hyperbaric oxygen therapy and I think as soon as we get this started the better. I discussed that with her today as well. With that being said I do think that the last thing she needs is her chest x-ray when she has that done will be ready to start her into treatment. She is going to try to go get that today when she leaves which I think would be ideal. 09-11-2022 upon evaluation today patient appears to be doing well with regard to her foot ulcer. This is showing signs of no digression and she does not appear to be having as much drainage as before though it still very wet is not nearly the extreme of what it was previous. Fortunately there does not appear to be any signs of active infection locally nor systemically at this point which is great news. Patient did have her chest x-ray as  well as EKG and she is actually approved and completely cleared for HBO therapy from both a clinical standpoint as well as an insurance standpoint. 09-25-2022 upon evaluation today patient appears to be doing well in regard to her foot ulcer. Unfortunately she is not doing as well when it comes to her hyperbaric treatments. She is having a lot of trouble with claustrophobia. She tells me in general that she is not sure she is good to be able to continue. 10-09-2022 upon evaluation today patient's wounds actually are showing signs of excellent improvement which is great news. Fortunately I do not see any evidence of infection locally or systemically which is great news and overall I am extremely pleased with where things are currently. I do think that she is making good progress she does tell me at this point today she does not want to go forward with a hyperbaric oxygen therapy. 10-16-2022 upon evaluation today patient appears to be doing excellent in regard to her foot ulcer. This is actually showing signs of excellent improvement. She still does not allow for any sharp debridement but the good news is this is doing much better and does not even need it at the moment. Nonetheless as far as sharp debridement is concerned this has been too painful for really not been able to do that all along. 10-23-2022 upon evaluation today patient actually is making excellent progress. I am extremely pleased with where we stand I have considered even seeing about putting her in the total contact cast but to be honest she is doing so well currently and still with having some drainage I feel like it is better for her to be able to change it then to be locked up in the cast for a week at a time. She has  voiced understanding as well. Overall though I feel like you are making some really good progress here. 10-30-2022 upon evaluation today patient appears to be doing excellent in regard to her foot ulcers. Both are showing  signs of improvement which is great news and overall I feel like that they are measuring smaller, looking better, and I see no signs of resurgence of the infection which is great news. Overall I am extremely pleased at this point. 2/7; the patient has 2 wounds which are remanence of the large wound on the right foot. This was a diabetic wound with underlying osteomyelitis she has completed antibiotics. Today she has the open area on the distal calcaneus and an area on the right midfoot which is eschared. She has been using silver alginate and offloading this in a regular running shoe. 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 11-27-2022 upon evaluation today patient appears to be doing well currently in regard to her wound which is actually showing signs of significant improvement this is great news. Fortunately there does not appear to be any signs of active infection locally nor systemically which is also excellent. No fevers, chills, nausea, vomiting, or diarrhea. 12-25-2022 upon evaluation today patient appears to be doing well currently in regard to the distal portion of her foot which is actually in my opinion looking a little better but have a lot of callus she is going require some debridement here. With that being said the heel actually looks like it might be a little bit worse. There is a lot of callus I cannot tell exactly what is open or not we can remove that and we will see what exactly were dealing with there. With that being said she also has an area on the right side of her leg which appears to be more of a pressure injury she tells me this is from sitting in her recliner she has gotten a pillow to help take care of this. Fortunately it is not technically open and draining at this point made it very well may be before it is also not done. 01-01-2023 upon evaluation today patient appears to be doing well currently in  regard to her wounds I feel like the cefdinir has helped she seems to be improved compared to last week's evaluation. I do not see any signs of active infection locally nor systemically at this time which is great news. No fevers, chills, nausea, vomiting, or diarrhea. 01-08-2023 upon evaluation today patient's wounds on the foot appear to be doing decently well the leg is still about the same. With that being said unfortunately she does have a lot of erythema on the leg in particular which is unfortunate and definitely not what we are looking for. Again I do not think that the [facility] has really done well for her in that regard. 01-15-2023 upon evaluation today patient appears to be doing well currently in regard to her wound. Overall I think that things are measuring a little bit better but unfortunately the erythema is spreading. I did review her culture today. Unfortunately it does show that she does have MRSA noted and again I think that we need to do something to try to get this under control as quickly as possible. She has previously been on IV antibiotics and unfortunately that has not been sufficient to get this completely cleared it seems to have come back at this point and she really  wants to avoid going back on IV antibiotics. We have been using oral antibiotics and various forms the most recent was actually a course of cefdinir. She is also previously been on clindamycin and even in the past she has had to doxycycline. With that being said with all things considered I think we are still having a lot of trouble here keeping this infection under control. We previously attempted hyperbarics but the patient did not tolerate this well. She therefore discontinued HBO therapy. 01-22-2023 upon evaluation today patient appears to be doing a little better in regard to the cellulitis on her right leg. I am much more pleased with what I am seeing today. With that being said unfortunately she is still  having quite a bit of issues here with erythema though not as bad I think that were not completely clear. Unfortunately we were denied for the [facility] prescription. She has the medication but I provided for her by way of samples but nothing further. 01-29-2023 upon evaluation today patient appears to be doing poorly currently in regard to her leg. I do not feel like she is showing as much improvement as she was within desirable now that we switch her to linezolid. With that being said I do believe that we probably need to see about IV antibiotics I recommended a referral back to ID. With that being said she would prefer to see her physician which I am okay with but either way I think she needs to be back on IV antibiotics for a minimum of 2 months as soon as possible. I am going to send her for lab work today in order to get some baseline labs but honestly I think that we are going to require IV antibiotics to get this under control. 02-12-2023 upon evaluation today patient appears to be doing well currently in regard to her wound all things considered. She has not gotten the lab work done yet that we ordered a couple weeks back. She tells me she has had a lot going on with the death of the family. Fortunately there does not appear to be any signs of worsening in fact some of the heel looks a little bit better which is great news. Fortunately I do not see any evidence of active infection locally nor systemically which is great news. 02-19-24 upon evaluation today patient appears to be doing well currently in regard to her wounds. She has been tolerating the dressing changes without complication. Fortunately there does not appear to be any signs of active infection locally nor systemically which is great news. No fevers, chills, nausea, vomiting, or diarrhea. She is currently on vancomycin and Zosyn through IV which is doing I think well she has been on it for not quite a week at this point. 03-05-2023 upon  evaluation today patient appears to be doing well currently in regard to her wound. She has been tolerating the dressing changes all 3 locations actually are significantly smaller even compared to last week I am very pleased with where we stand. I think the antibiotics are really doing a good job here for her. 03-12-2023 upon evaluation today patient has actually continued to make excellent progress in regard to her wounds. I am very pleased with where we stand in fact I think her leg is healed the middle part of her foot I think is close to being closed and the heel does look better though there is some callus that needs to be removed at this point. I  did discuss that with her today. Janowski, [name] ([id number]) [id number] Page 8 of 10 03-19-2023 upon evaluation today patient's wounds are actually showing signs of excellent improvement. I am actually very pleased with where we stand and I think that she is making really good progress here. I do not see any signs of active infection locally nor systemically which is great news. 03-26-2023 upon evaluation today patient's wounds actually are showing signs of improvement in fact the middle of her foot is pretty much appearing to be completely healed which is great news. This means we are just dealing with the heel itself on the bottom of the foot as far as anything remaining is concerned at this point. I am extremely pleased with where we stand at this time. 04-09-2023 upon evaluation today patient appears to be doing well currently in regard to the periwound. She has been tolerating the dressing changes without complication. Fortunately there does not appear to be any signs of active infection locally or systemically at this time. Potentially seeing about getting a total contact cast on and try to get this closed as quickly as possible. I think the casting is probably our best bet at this point and the patient is in agreement  with that plan. At this point based on what I am seeing I do believe that the patient would benefit from a continuation of care with regard to dressing changes regularly. 04-16-2023 upon evaluation today patient appears to be doing well currently in regard to her wound which is actually showing signs of good improvement. Fortunately I do not see any evidence of active infection locally or systemically which is great news and in general I do believe that we are making good headway towards complete closure. I do think she is a candidate however for total contact cast which I think will speed up the closure process. 7/12; patient presents for follow-up. She is here for our obligatory cast change. She had the cast first placed two days ago and Hydrofera Blue was under the cast. She has no issues or complaints today. 04-23-2023 upon evaluation today patient appears to be doing well currently in regard to her wound. She is actually showing signs of excellent improvement and in general I feel like that we are making great headway towards closure. Her wound is significantly improved compared to last week when I saw her the cast is doing great despite the fact got so loose that today the cast actually slipped off without [place] even having to cut it with a cast saw. Obviously she is lost quite a bit of fluid in fact we did for inch cast last time she is definitely going to be in the 3 today she has lost a tremendous amount of fluid in this leg which is actually really good news. I do not see any signs of active infection locally nor systemically at this point. Objective Constitutional Obese and well-hydrated in no acute distress. Vitals Time Taken: 9:48 AM, Weight: 331 lbs, Temperature: 98 F, Pulse: 109 bpm, Respiratory Rate: 18 breaths/min, Blood Pressure: 109/76 mmHg. Respiratory normal breathing without difficulty. Psychiatric this patient is able to make decisions and demonstrates good insight into disease  process. Alert and Oriented x 3. pleasant and cooperative. General Notes: Upon inspection patient's wound bed actually showed signs of excellent granulation epithelization at this point. Fortunately I do not see any evidence of worsening I do believe the total contact cast is helping and I think it will help even more now  that we will have a smaller and more appropriate size on her that we will not be able to slip around and slide off just with tugging on it. Fortunately I think that she is really doing quite well. Integumentary (Hair, Skin) Wound #1 status is Open. Original cause of wound was Gradually Appeared. The date acquired was: 11/07/2020. The wound has been in treatment 45 weeks. The wound is located on the Right Calcaneus. The wound measures 1.1cm length x 1cm width x 0.2cm depth; 0.864cm^2 area and 0.173cm^3 volume. There is Fat Layer (Subcutaneous Tissue) exposed. There is no tunneling or undermining noted. There is a medium amount of serosanguineous drainage noted. The wound margin is thickened. There is large (67-100%) red, pink granulation within the wound bed. There is a small (1-33%) amount of necrotic tissue within the wound bed including Adherent Slough. The periwound skin appearance exhibited: Callus. The periwound skin appearance did not exhibit: Crepitus, Excoriation, Induration, Rash, Scarring, Dry/Scaly, Maceration, Atrophie Blanche, Cyanosis, Ecchymosis, Hemosiderin Staining, Mottled, Pallor, Rubor, Erythema. Periwound temperature was noted as No Abnormality. The periwound has tenderness on palpation. Assessment Active Problems ICD-10 Other chronic osteomyelitis, right ankle and foot Type 2 diabetes mellitus with foot ulcer Non-pressure chronic ulcer of right heel and midfoot with fat layer exposed Lymphedema, not elsewhere classified Non-pressure chronic ulcer of other part of right lower leg with fat layer exposed Procedures Wound #1 Pre-procedure diagnosis of Wound #1  is a Diabetic Wound/Ulcer of the Lower Extremity located on the Right Calcaneus . There was a T Contact Cast otal Procedure by Lenda Kelp, PA. Post procedure Diagnosis Wound #1: Same as Pre-Procedure Ganas, Jaleeah ([id number]) [id number] Page 9 of 10 Notes: Try TCC #3 this week. Plan Follow-up Appointments: Return Appointment in 1 week. Allen Derry III PA-C Wednesday 04/30/23 at 8:45am Anesthetic: (In clinic) Topical Lidocaine 5% applied to wound bed Bathing/ Shower/ Hygiene: May shower with protection but do not get wound dressing(s) wet. Protect dressing(s) with water repellant cover (for example, large plastic bag) or a cast cover and may then take shower. Edema Control - Lymphedema / SCD / Other: Elevate legs to the level of the heart or above for 30 minutes daily and/or when sitting for 3-4 times a day throughout the day. Avoid standing for long periods of time. Off-Loading: T Contact Cast to Right Lower Extremity - TCC #3 otal WOUND #1: - Calcaneus Wound Laterality: Right Cleanser: Soap and Water 1 x Per Week/30 Days Discharge Instructions: May shower and wash wound with dial antibacterial soap and water prior to dressing change. Cleanser: Wound Cleanser (Generic) 1 x Per Week/30 Days Discharge Instructions: Cleanse the wound with wound cleanser prior to applying a clean dressing using gauze sponges, not tissue or cotton balls. Peri-Wound Care: Sween Lotion (Moisturizing lotion) 1 x Per Week/30 Days Discharge Instructions: Apply moisturizing lotion as directed Prim Dressing: Promogran Prisma Matrix, 4.34 (sq in) (silver collagen) 1 x Per Week/30 Days ary Discharge Instructions: Moisten collagen with saline or hydrogel Secondary Dressing: Woven Gauze Sponge, Non-Sterile 4x4 in (Generic) 1 x Per Week/30 Days Discharge Instructions: Apply over primary dressing as directed. Secondary Dressing: Zetuvit Plus 4x8 in 1 x Per Week/30 Days Discharge  Instructions: Apply over primary dressing as directed. Secured With: [employer], 4.5x3.1 (in/yd) 1 x Per Week/30 Days Discharge Instructions: Secure with Kerlix as directed. Secured With: 73M Medipore H Soft Cloth Surgical T ape, 4 x 10 (in/yd) 1 x Per Week/30 Days Discharge Instructions: Secure with tape as  directed. 1. I am good recommend that we have the patient continue to monitor for any signs of infection or worsening. Based on what I am seeing I do believe that she is doing well with total contact casting. 2 I would recommend that we shift to using the silver collagen I think this is good to be the best way to go and she is in agreement with that plan. 3. I am also can recommend that patient should continue to monitor for any evidence of infection or worsening. Based on what I am seeing I do believe that we are making good headway towards complete closure which is great news work and reapply the cast I did do that today and we will see how things do and where to put her in a size 3 this time as her leg is significantly smaller compared to where we started. Electronic Signature(s) Signed: 04/23/2023 10:30:07 AM By: Allen Derry PA-C Entered By: Allen Derry on 04/23/2023 10:30:07 -------------------------------------------------------------------------------- Total Contact Cast Details Patient Name: Date of Service: [name], [name] 04/23/2023 9:30 A M Medical Record Number: [id number] Patient Account Number: [id number] Date of Birth/Sex: Treating RN: [birth year] (57 y.o. [name] Primary Care Provider: [name] Other Clinician: Referring Provider: Treating Provider/Extender: [name] in Treatment: [phone] T Contact Cast Applied for Wound Assessment: otal Wound #1 Right Calcaneus Performed By: Physician Lenda Kelp, PA Post Procedure Diagnosis Same as Pre-procedure Notes Try TCC #3 this week Electronic Signature(s) Signed: 04/23/2023 4:26:17  PM By: Jamey Ripa, Jenell ([id number]) [id number] Page 10 of 10 Signed: 04/23/2023 5:37:42 PM By: Karie Schwalbe RN Entered By: Karie Schwalbe on 04/23/2023 10:19:58 -------------------------------------------------------------------------------- SuperBill Details Patient Name: Date of Service: [name], [name] 04/23/2023 Medical Record Number: [id number] Patient Account Number: [id number] Date of Birth/Sex: Treating RN: 03/18/1966 (57 y.o. F) Primary Care Provider: [name] Other Clinician: Referring Provider: Treating Provider/Extender: [name] in Treatment: 45 Diagnosis Coding ICD-10 Codes Code Description [phone] Other chronic osteomyelitis, right ankle and foot E11.621 Type 2 diabetes mellitus with foot ulcer L97.412 Non-pressure chronic ulcer of right heel and midfoot with fat layer exposed I89.0 Lymphedema, not elsewhere classified L97.812 Non-pressure chronic ulcer of other part of right lower leg with fat layer exposed Facility Procedures : CPT4 Code: 13244010 Description: [phone] - APPLY TOTAL CONTACT LEG CAST ICD-10 Diagnosis Description L97.412 Non-pressure chronic ulcer of right heel and midfoot with fat layer exposed Modifier: Quantity: 1 Physician Procedures : CPT4 Code Description Modifier [id number] - WC PHYS APPLY TOTAL CONTACT CAST ICD-10 Diagnosis Description L97.412 Non-pressure chronic ulcer of right heel and midfoot with fat layer exposed Quantity: 1 Electronic Signature(s) Signed: 04/23/2023 10:31:08 AM By: Allen Derry PA-C Previous Signature: 04/23/2023 10:30:18 AM Version By: Allen Derry PA-C Entered By: Allen Derry on 04/23/2023 10:31:07

## 2023-04-25 NOTE — Progress Notes (Signed)
[name], Arline [name] ([id number]) [id number] Page 1 of 6 Visit Report for 04/23/2023 Arrival Information Details Patient Name: Date of Service: [name], [name] 04/23/2023 9:30 A M Medical Record Number: [id number] Patient Account Number: [id number] Date of Birth/Sex: Treating RN: [birth year] (57 y.o. F) Primary Care Ercole Georg: [name] Other Clinician: Referring Verlyn Dannenberg: Treating Shirlie Enck/Extender: [name] in Treatment: 45 Visit Information History Since Last Visit Added or deleted any medications: No Patient Arrived: Ambulatory Any new allergies or adverse reactions: No Arrival Time: 09:46 Had a fall or experienced change in No Accompanied By: son activities of daily living that may affect Transfer Assistance: None risk of falls: Patient Identification Verified: Yes Signs or symptoms of abuse/neglect since last No Secondary Verification Process Completed: Yes visito Patient Requires Transmission-Based Precautions: No Hospitalized since last visit: No Patient Has Alerts: Yes Implantable device outside of the clinic No Patient Alerts: PICC in left arm excluding cellular tissue based products placed in the center since last visit: Has Dressing in Place as Prescribed: Yes Has Footwear/Offloading in Place as Yes Prescribed: Right: Removable Cast Walker/Walking Boot T Contact Cast otal Pain Present Now: No Electronic Signature(s) Signed: 04/24/2023 4:50:33 PM By: Thayer Dallas Entered By: Thayer Dallas on 04/23/2023 09:46:34 -------------------------------------------------------------------------------- Encounter Discharge Information Details Patient Name: Date of Service: [name], [name] 04/23/2023 9:30 A M Medical Record Number: [id number] Patient Account Number: [id number] Date of Birth/Sex: Treating RN: [birth year] (56 y.o. [name] Primary Care Koehn Salehi: [name] Other Clinician: Referring Jamarcus Laduke: Treating  Davanna He/Extender: [name] in Treatment: 56 Encounter Discharge Information Items Discharge Condition: Stable Ambulatory Status: Ambulatory Discharge Destination: Home Transportation: Private Auto Accompanied By: son Schedule Follow-up Appointment: Yes Clinical Summary of Care: Patient Declined Electronic Signature(s) Signed: 04/23/2023 5:37:42 PM By: Karie Schwalbe RN Entered By: Karie Schwalbe on 04/23/2023 17:25:10 Dearmond, Deyci ([id number]) [id number] Page 2 of 6 -------------------------------------------------------------------------------- Lower Extremity Assessment Details Patient Name: Date of Service: [name], [name] 04/23/2023 9:30 A M Medical Record Number: [id number] Patient Account Number: [id number] Date of Birth/Sex: Treating RN: 1966-05-17 (57 y.o. F) Primary Care Quadre Bristol: [name] Other Clinician: Referring Hara Milholland: Treating Morty Ortwein/Extender: [name] in Treatment: 45 Edema Assessment Assessed: [Left: No] [Right: No] Edema: [Left: Ye] [Right: s] Calf Left: Right: Point of Measurement: 32 cm From Medial Instep 49.5 cm Ankle Left: Right: Point of Measurement: 10 cm From Medial Instep 30.9 cm Vascular Assessment Pulses: Dorsalis Pedis Palpable: [Right:Yes] Extremity colors, hair growth, and conditions: Extremity Color: [Right:Normal] Hair Growth on Extremity: [Right:No] Temperature of Extremity: [Right:Warm] Capillary Refill: [Right:< 3 seconds] Dependent Rubor: [Right:No No] Electronic Signature(s) Signed: 04/23/2023 5:37:42 PM By: Karie Schwalbe RN Entered By: Karie Schwalbe on 04/23/2023 10:13:52 -------------------------------------------------------------------------------- Multi-Disciplinary Care Plan Details Patient Name: Date of Service: BONETTA, MOSTEK 04/23/2023 9:30 A M Medical Record Number: [id number] Patient Account Number: [id number] Date of Birth/Sex:  Treating RN: 08-07-1966 (57 y.o. [name] Primary Care Lucianne Smestad: [name] Other Clinician: Referring Tocara Mennen: Treating Tabathia Knoche/Extender: [name] in Treatment: 36 Active Inactive Wound/Skin Impairment Nursing Diagnoses: Impaired tissue integrity Knowledge deficit related to ulceration/compromised skin integrity [name], [name] ([id number]) [id number] Page 3 of 6 Goals: Patient will have a decrease in wound volume by X% from date: (specify in notes) Date Initiated: 06/12/2022 Target Resolution Date: 06/07/2023 Goal Status: Active Patient/caregiver will verbalize understanding of skin care regimen Date Initiated: 06/12/2022 Target Resolution Date: 06/07/2023 Goal Status: Active Ulcer/skin breakdown will have a volume reduction of 30% by week 4  Date Initiated: 06/12/2022 Date Inactivated: 07/31/2022 Target Resolution Date: 08/03/2022 Goal Status: Unmet Unmet Reason: dx with osteomyelitis. Ulcer/skin breakdown will have a volume reduction of 50% by week 8 Date Initiated: 06/12/2022 Date Inactivated: 10/16/2022 Target Resolution Date: 10/05/2022 Unmet Reason: see wound Goal Status: Unmet measurement. IV antibiotics Interventions: Assess patient/caregiver ability to obtain necessary supplies Assess patient/caregiver ability to perform ulcer/skin care regimen upon admission and as needed Assess ulceration(s) every visit Notes: Electronic Signature(s) Signed: 04/23/2023 5:37:42 PM By: Karie Schwalbe RN Entered By: Karie Schwalbe on 04/23/2023 17:23:57 -------------------------------------------------------------------------------- Pain Assessment Details Patient Name: Date of Service: [name], [name] 04/23/2023 9:30 A M Medical Record Number: [id number] Patient Account Number: [id number] Date of Birth/Sex: Treating RN: [birth year] (57 y.o. [name] Primary Care Spurgeon Gancarz: [name] Other Clinician: Referring  Tocarra Gassen: Treating Lyda Colcord/Extender: [name] in Treatment: 23 Active Problems Location of Pain Severity and Description of Pain Patient Has Paino No Site Locations Pain Management and Medication Current Pain Management: Electronic Signature(s) Signed: 04/23/2023 5:37:42 PM By: Karie Schwalbe RN Entered By: Karie Schwalbe on 04/23/2023 10:13:48 Hataway, Amberlin ([id number]) [id number] Page 4 of 6 -------------------------------------------------------------------------------- Patient/Caregiver Education Details Patient Name: Date of Service: BONNEY, BERRES 7/17/2024andnbsp9:30 A M Medical Record Number: [id number] Patient Account Number: [id number] Date of Birth/Gender: Treating RN: 1966/06/17 (57 y.o. [name] Primary Care Physician: [name] Other Clinician: Referring Physician: Treating Physician/Extender: [name] in Treatment: 103 Education Assessment Education Provided To: Patient Education Topics Provided Wound/Skin Impairment: Methods: Explain/Verbal Responses: Return demonstration correctly Electronic Signature(s) Signed: 04/23/2023 5:37:42 PM By: Karie Schwalbe RN Entered By: Karie Schwalbe on 04/23/2023 17:24:17 -------------------------------------------------------------------------------- Wound Assessment Details Patient Name: Date of Service: KABAO, LEITE 04/23/2023 9:30 A M Medical Record Number: [id number] Patient Account Number: [id number] Date of Birth/Sex: Treating RN: [birth year] (57 y.o. F) Primary Care Khailee Mick: [name] Other Clinician: Referring Desani Sprung: Treating Berdena Cisek/Extender: [name] in Treatment: 45 Wound Status Wound Number: 1 Primary Etiology: Diabetic Wound/Ulcer of the Lower Extremity Wound Location: Right Calcaneus Wound Status: Open Wounding Event: Gradually Appeared Comorbid History: Hypertension, Type II  Diabetes, Osteomyelitis Date Acquired: 11/07/2020 Weeks Of Treatment: 45 Clustered Wound: Yes Photos Wound Measurements Length: (cm) 1. Meine, Taralyn ([id number]) Width: (cm) Depth: (cm) Clustered Quantity: Area: (cm) Volume: (cm) 1 % Reduction in Area: 92.6% [id number] Page 5 of 6 1 % Reduction in Volume: 92.6% 0.2 Epithelialization: Medium (34-66%) 1 Tunneling: No 0.864 Undermining: No 0.173 Wound Description Classification: Grade 3 Wound Margin: Thickened Exudate Amount: Medium Exudate Type: Serosanguineous Exudate Color: red, brown Foul Odor After Cleansing: No Slough/Fibrino No Wound Bed Granulation Amount: Large (67-100%) Exposed Structure Granulation Quality: Red, Pink Fascia Exposed: No Necrotic Amount: Small (1-33%) Fat Layer (Subcutaneous Tissue) Exposed: Yes Necrotic Quality: Adherent Slough Tendon Exposed: No Muscle Exposed: No Joint Exposed: No Bone Exposed: No Periwound Skin Texture Texture Color No Abnormalities Noted: No No Abnormalities Noted: No Callus: Yes Atrophie Blanche: No Crepitus: No Cyanosis: No Excoriation: No Ecchymosis: No Induration: No Erythema: No Rash: No Hemosiderin Staining: No Scarring: No Mottled: No Pallor: No Moisture Rubor: No No Abnormalities Noted: No Dry / Scaly: No Temperature / Pain Maceration: No Temperature: No Abnormality Tenderness on Palpation: Yes Treatment Notes Wound #1 (Calcaneus) Wound Laterality: Right Cleanser Soap and Water Discharge Instruction: May shower and wash wound with dial antibacterial soap and water prior to dressing change. Wound Cleanser Discharge Instruction: Cleanse the wound with wound cleanser prior to applying a clean dressing using  gauze sponges, not tissue or cotton balls. Peri-Wound Care Sween Lotion (Moisturizing lotion) Discharge Instruction: Apply moisturizing lotion as directed Topical Primary Dressing Promogran Prisma Matrix, 4.34 (sq  in) (silver collagen) Discharge Instruction: Moisten collagen with saline or hydrogel Secondary Dressing Woven Gauze Sponge, Non-Sterile 4x4 in Discharge Instruction: Apply over primary dressing as directed. Zetuvit Plus 4x8 in Discharge Instruction: Apply over primary dressing as directed. Secured With [employer], 4.5x3.1 (in/yd) Discharge Instruction: Secure with Kerlix as directed. 52M Medipore H Soft Cloth Surgical T ape, 4 x 10 (in/yd) Discharge Instruction: Secure with tape as directed. Compression Wrap Compression Stockings [place], [employer] [id number] [id number]) [id number] Page 6 of 6 Add-Ons Electronic Signature(s) Signed: 04/23/2023 5:37:42 PM By: Karie Schwalbe RN Entered By: Karie Schwalbe on 04/23/2023 10:19:17 -------------------------------------------------------------------------------- Vitals Details Patient Name: Date of Service: HAZEL, [name] 04/23/2023 9:30 A M Medical Record Number: [id number] Patient Account Number: [id number] Date of Birth/Sex: Treating RN: 04-08-1966 (57 y.o. F) Primary Care Olivine Hiers: [name] Other Clinician: Referring Chena Chohan: Treating Mairen Wallenstein/Extender: [name] in Treatment: 45 Vital Signs Time Taken: 09:48 Temperature (F): 98 Weight (lbs): 331 Pulse (bpm): 109 Respiratory Rate (breaths/min): 18 Blood Pressure (mmHg): 109/76 Reference Range: 80 - 120 mg / dl Electronic Signature(s) Signed: 04/24/2023 4:50:33 PM By: Thayer Dallas Entered By: Thayer Dallas on 04/23/2023 09:48:19

## 2023-04-30 ENCOUNTER — Encounter (HOSPITAL_BASED_OUTPATIENT_CLINIC_OR_DEPARTMENT_OTHER): Payer: No Typology Code available for payment source | Admitting: Internal Medicine

## 2023-04-30 DIAGNOSIS — E11621 Type 2 diabetes mellitus with foot ulcer: Secondary | ICD-10-CM | POA: Diagnosis not present

## 2023-05-01 NOTE — Progress Notes (Signed)
Ehler, [name] ([id number]) [id number] Page 1 of 8 Visit Report for 04/30/2023 Arrival Information Details Patient Name: Date of Service: [name], [name] 04/30/2023 8:45 A M Medical Record Number: [id number] Patient Account Number: [id number] Date of Birth/Sex: Treating RN: 1966-02-09 (57 y.o. F) Primary Care Giorgio Chabot: [name] Other Clinician: Referring Khaya Theissen: Treating Braeson Rupe/Extender: Delora Fuel in Treatment: 46 Visit Information History Since Last Visit Added or deleted any medications: No Patient Arrived: Ambulatory Any new allergies or adverse reactions: No Arrival Time: 08:54 Had a fall or experienced change in No Accompanied By: son activities of daily living that may affect Transfer Assistance: None risk of falls: Patient Identification Verified: Yes Signs or symptoms of abuse/neglect since last No Secondary Verification Process Completed: Yes visito Patient Requires Transmission-Based Precautions: No Hospitalized since last visit: No Patient Has Alerts: Yes Implantable device outside of the clinic No Patient Alerts: PICC in left arm excluding cellular tissue based products placed in the center since last visit: Has Dressing in Place as Prescribed: Yes Has Footwear/Offloading in Place as Yes Prescribed: Right: Removable Cast Walker/Walking Boot T Contact Cast otal Pain Present Now: No Electronic Signature(s) Signed: 05/01/2023 4:36:58 PM By: Thayer Dallas Entered By: Thayer Dallas on 04/30/2023 08:55:43 -------------------------------------------------------------------------------- Encounter Discharge Information Details Patient Name: Date of Service: [name], [name] 04/30/2023 8:45 A M Medical Record Number: [id number] Patient Account Number: [id number] Date of Birth/Sex: Treating RN: June 08, 1966 (57 y.o. [name] Primary Care Shanesha Bednarz: [name] Other Clinician: Referring Chery Giusto: Treating  Legend Tumminello/Extender: Delora Fuel in Treatment: 24 Encounter Discharge Information Items Discharge Condition: Stable Ambulatory Status: Ambulatory Discharge Destination: Home Transportation: Private Auto Accompanied By: son Schedule Follow-up Appointment: Yes Clinical Summary of Care: Electronic Signature(s) Signed: 04/30/2023 6:08:49 PM By: Shawn Stall RN, BSN Entered By: Shawn Stall on 04/30/2023 09:23:09 [name], [name] ([id number]) [id number] Page 2 of 8 -------------------------------------------------------------------------------- Lower Extremity Assessment Details Patient Name: Date of Service: [name], [name] 04/30/2023 8:45 A M Medical Record Number: [id number] Patient Account Number: [id number] Date of Birth/Sex: Treating RN: 28-Aug-1966 (57 y.o. F) Primary Care Roxi Hlavaty: [name] Other Clinician: Referring Janavia Rottman: Treating Aanya Haynes/Extender: Delora Fuel in Treatment: 46 Edema Assessment Assessed: Kyra Searles: No] Franne Forts: No] Edema: [Left: Ye] [Right: s] Calf Left: Right: Point of Measurement: 32 cm From Medial Instep 48.5 cm Ankle Left: Right: Point of Measurement: 10 cm From Medial Instep 30.5 cm Vascular Assessment Extremity colors, hair growth, and conditions: Extremity Color: [Right:Normal] Hair Growth on Extremity: [Right:No] Temperature of Extremity: [Right:Warm] Capillary Refill: [Right:< 3 seconds] Dependent Rubor: [Right:No No] Electronic Signature(s) Signed: 05/01/2023 4:36:58 PM By: Thayer Dallas Entered By: Thayer Dallas on 04/30/2023 09:05:36 -------------------------------------------------------------------------------- Multi Wound Chart Details Patient Name: Date of Service: [name], [name] 04/30/2023 8:45 A M Medical Record Number: [id number] Patient Account Number: [id number] Date of Birth/Sex: Treating RN: 08/22/1966 (57 y.o. F) Primary Care Geneve Kimpel: [name] Other  Clinician: Referring Daffney Greenly: Treating Nicklos Gaxiola/Extender: Delora Fuel in Treatment: 46 Vital Signs Height(in): Pulse(bpm): 97 Weight(lbs): 331 Blood Pressure(mmHg): 123/76 Body Mass Index(BMI): Temperature(F): 98.3 Respiratory Rate(breaths/min): 18 [1:Photos:] [N/A:N/A] Right Calcaneus N/A N/A Wound Location: Gradually Appeared N/A N/A Wounding Event: Diabetic Wound/Ulcer of the Lower N/A N/A Primary Etiology: Extremity Hypertension, Type II Diabetes, N/A N/A Comorbid History: Osteomyelitis 11/07/2020 N/A N/A Date Acquired: 70 N/A N/A Weeks of Treatment: Open N/A N/A Wound Status: No N/A N/A Wound Recurrence: Yes N/A N/A Clustered Wound: 1 N/A N/A Clustered Quantity: 0.5x0.5x0.2 N/A N/A Measurements L x W x D (cm) 0.196  N/A N/A A (cm) : rea 0.039 N/A N/A Volume (cm) : 98.30% N/A N/A % Reduction in A rea: 98.30% N/A N/A % Reduction in Volume: Grade 3 N/A N/A Classification: Medium N/A N/A Exudate A mount: Serosanguineous N/A N/A Exudate Type: red, brown N/A N/A Exudate Color: Thickened N/A N/A Wound Margin: Large (67-100%) N/A N/A Granulation A mount: Red, Pink N/A N/A Granulation Quality: Small (1-33%) N/A N/A Necrotic A mount: Fat Layer (Subcutaneous Tissue): Yes N/A N/A Exposed Structures: Fascia: No Tendon: No Muscle: No Joint: No Bone: No Medium (34-66%) N/A N/A Epithelialization: Callus: Yes N/A N/A Periwound Skin Texture: Excoriation: No Induration: No Crepitus: No Rash: No Scarring: No Maceration: No N/A N/A Periwound Skin Moisture: Dry/Scaly: No Atrophie Blanche: No N/A N/A Periwound Skin Color: Cyanosis: No Ecchymosis: No Erythema: No Hemosiderin Staining: No Mottled: No Pallor: No Rubor: No No Abnormality N/A N/A Temperature: Yes N/A N/A Tenderness on Palpation: T Contact Cast otal N/A N/A Procedures Performed: Treatment Notes Wound #1 (Calcaneus) Wound Laterality: Right Cleanser Soap  and Water Discharge Instruction: May shower and wash wound with dial antibacterial soap and water prior to dressing change. Wound Cleanser Discharge Instruction: Cleanse the wound with wound cleanser prior to applying a clean dressing using gauze sponges, not tissue or cotton balls. Peri-Wound Care Sween Lotion (Moisturizing lotion) Discharge Instruction: Apply moisturizing lotion as directed Topical Primary Dressing Promogran Prisma Matrix, 4.34 (sq in) (silver collagen) Discharge Instruction: Moisten collagen with saline or hydrogel Secondary Dressing Woven Gauze Sponge, Non-Sterile 4x4 in Discharge Instruction: Apply over primary dressing as directed. Zetuvit Plus 4x8 in [place], [place] ([id number]) [id number] Page 4 of 8 Discharge Instruction: Apply over primary dressing as directed. Secured With [employer], 4.5x3.1 (in/yd) Discharge Instruction: Secure with Kerlix as directed. 59M Medipore H Soft Cloth Surgical T ape, 4 x 10 (in/yd) Discharge Instruction: Secure with tape as directed. Compression Wrap Compression Stockings Add-Ons Electronic Signature(s) Signed: 04/30/2023 4:12:50 PM By: Baltazar Najjar MD Entered By: Baltazar Najjar on 04/30/2023 09:27:16 -------------------------------------------------------------------------------- Multi-Disciplinary Care Plan Details Patient Name: Date of Service: [name], [name] 04/30/2023 8:45 A M Medical Record Number: [id number] Patient Account Number: [id number] Date of Birth/Sex: Treating RN: 02-15-1966 (57 y.o. [name], Yvonne Kendall Primary Care Beautiful Pensyl: [name] Other Clinician: Referring Codie Krogh: Treating Lakhia Gengler/Extender: Delora Fuel in Treatment: 77 Active Inactive Wound/Skin Impairment Nursing Diagnoses: Impaired tissue integrity Knowledge deficit related to ulceration/compromised skin integrity Goals: Patient will have a decrease in wound volume by X% from date:  (specify in notes) Date Initiated: 06/12/2022 Target Resolution Date: 06/07/2023 Goal Status: Active Patient/caregiver will verbalize understanding of skin care regimen Date Initiated: 06/12/2022 Target Resolution Date: 06/07/2023 Goal Status: Active Ulcer/skin breakdown will have a volume reduction of 30% by week 4 Date Initiated: 06/12/2022 Date Inactivated: 07/31/2022 Target Resolution Date: 08/03/2022 Goal Status: Unmet Unmet Reason: dx with osteomyelitis. Ulcer/skin breakdown will have a volume reduction of 50% by week 8 Date Initiated: 06/12/2022 Date Inactivated: 10/16/2022 Target Resolution Date: 10/05/2022 Unmet Reason: see wound Goal Status: Unmet measurement. IV antibiotics Interventions: Assess patient/caregiver ability to obtain necessary supplies Assess patient/caregiver ability to perform ulcer/skin care regimen upon admission and as needed Assess ulceration(s) every visit Notes: Electronic Signature(s) Signed: 04/30/2023 6:08:49 PM By: Shawn Stall RN, BSN Entered By: Shawn Stall on 04/30/2023 09:04:24 [name], [name] ([id number]) [id number] Page 5 of 8 -------------------------------------------------------------------------------- Pain Assessment Details Patient Name: Date of Service: [name], [name] 04/30/2023 8:45 A M Medical Record Number: [id number] Patient Account Number: [id number] Date of Birth/Sex: Treating RN:  [birth year] (57 y.o. F) Primary Care Rydge Texidor: [name] Other Clinician: Referring Apryll Hinkle: Treating Noriah Osgood/Extender: Delora Fuel in Treatment: 46 Active Problems Location of Pain Severity and Description of Pain Patient Has Paino No Site Locations Pain Management and Medication Current Pain Management: Electronic Signature(s) Signed: 05/01/2023 4:36:58 PM By: Thayer Dallas Entered By: Thayer Dallas on 04/30/2023  08:56:10 -------------------------------------------------------------------------------- Patient/Caregiver Education Details Patient Name: Date of Service: [name] 7/24/2024andnbsp8:45 A M Medical Record Number: [id number] Patient Account Number: [id number] Date of Birth/Gender: Treating RN: [birth year] (57 y.o. [name] Primary Care Physician: [name] Other Clinician: Referring Physician: Treating Physician/Extender: Delora Fuel in Treatment: 38 Education Assessment Education Provided To: Patient Education Topics Provided Wound/Skin Impairment: Handouts: Caring for Your Ulcer Methods: Explain/Verbal Responses: Reinforcements needed [name], [name] ([id number]) [id number] Page 6 of 8 Electronic Signature(s) Signed: 04/30/2023 6:08:49 PM By: Shawn Stall RN, BSN Entered By: Shawn Stall on 04/30/2023 09:04:38 -------------------------------------------------------------------------------- Wound Assessment Details Patient Name: Date of Service: [name], [name] 04/30/2023 8:45 A M Medical Record Number: [id number] Patient Account Number: [id number] Date of Birth/Sex: Treating RN: [birth year] (57 y.o. F) Primary Care Charley Lafrance: [name] Other Clinician: Referring Nechemia Chiappetta: Treating Breyer Tejera/Extender: Delora Fuel in Treatment: 46 Wound Status Wound Number: 1 Primary Etiology: Diabetic Wound/Ulcer of the Lower Extremity Wound Location: Right Calcaneus Wound Status: Open Wounding Event: Gradually Appeared Comorbid History: Hypertension, Type II Diabetes, Osteomyelitis Date Acquired: 11/07/2020 Weeks Of Treatment: 46 Clustered Wound: Yes Photos Wound Measurements Length: (cm) Width: (cm) Depth: (cm) Clustered Quantity: Area: (cm) Volume: (cm) 0.5 % Reduction in Area: 98.3% 0.5 % Reduction in Volume: 98.3% 0.2 Epithelialization: Medium (34-66%) 1 Tunneling: No 0.196 Undermining:  No 0.039 Wound Description Classification: Grade 3 Wound Margin: Thickened Exudate Amount: Medium Exudate Type: Serosanguineous Exudate Color: red, brown Foul Odor After Cleansing: No Slough/Fibrino No Wound Bed Granulation Amount: Large (67-100%) Exposed Structure Granulation Quality: Red, Pink Fascia Exposed: No Necrotic Amount: Small (1-33%) Fat Layer (Subcutaneous Tissue) Exposed: Yes Necrotic Quality: Adherent Slough Tendon Exposed: No Muscle Exposed: No Joint Exposed: No Bone Exposed: No Periwound Skin Texture Texture Color No Abnormalities Noted: No No Abnormalities Noted: No Callus: Yes Atrophie Blanche: No [name], [name] ([id number]) [id number] Page 7 of 8 Crepitus: No Cyanosis: No Excoriation: No Ecchymosis: No Induration: No Erythema: No Rash: No Hemosiderin Staining: No Scarring: No Mottled: No Pallor: No Moisture Rubor: No No Abnormalities Noted: No Dry / Scaly: No Temperature / Pain Maceration: No Temperature: No Abnormality Tenderness on Palpation: Yes Treatment Notes Wound #1 (Calcaneus) Wound Laterality: Right Cleanser Soap and Water Discharge Instruction: May shower and wash wound with dial antibacterial soap and water prior to dressing change. Wound Cleanser Discharge Instruction: Cleanse the wound with wound cleanser prior to applying a clean dressing using gauze sponges, not tissue or cotton balls. Peri-Wound Care Sween Lotion (Moisturizing lotion) Discharge Instruction: Apply moisturizing lotion as directed Topical Primary Dressing Promogran Prisma Matrix, 4.34 (sq in) (silver collagen) Discharge Instruction: Moisten collagen with saline or hydrogel Secondary Dressing Woven Gauze Sponge, Non-Sterile 4x4 in Discharge Instruction: Apply over primary dressing as directed. Zetuvit Plus 4x8 in Discharge Instruction: Apply over primary dressing as directed. Secured With [employer], 4.5x3.1  (in/yd) Discharge Instruction: Secure with Kerlix as directed. 31M Medipore H Soft Cloth Surgical T ape, 4 x 10 (in/yd) Discharge Instruction: Secure with tape as directed. Compression Wrap Compression Stockings Add-Ons Electronic Signature(s) Signed: 05/01/2023 4:36:58 PM By: Thayer Dallas Entered By: Thayer Dallas on 04/30/2023 09:13:52 --------------------------------------------------------------------------------  Vitals Details Patient Name: Date of Service: [name], [name] 04/30/2023 8:45 A M Medical Record Number: [id number] Patient Account Number: [id number] Date of Birth/Sex: Treating RN: [birth year] (57 y.o. F) Primary Care Arionna Hoggard: [name] Other Clinician: Referring Devlyn Parish: Treating Rosio Weiss/Extender: Delora Fuel in Treatment: 46 Vital Signs Time Taken: 08:56 Temperature (F): 98.3 Weight (lbs): 331 Pulse (bpm): 97 [name], [name] ([id number]) [phone] Page 8 of 8 Respiratory Rate (breaths/min): 18 Blood Pressure (mmHg): 123/76 Reference Range: 80 - 120 mg / dl Electronic Signature(s) Signed: 05/01/2023 4:36:58 PM By: Thayer Dallas Entered By: Thayer Dallas on 04/30/2023 08:56:04

## 2023-05-01 NOTE — Progress Notes (Signed)
Seto, [name] ([id number]) [id number] Page 1 of 10 Visit Report for 04/30/2023 HPI Details Patient Name: Date of Service: [name], [name] 04/30/2023 8:45 A M Medical Record Number: [id number] Patient Account Number: [id number] Date of Birth/Sex: Treating RN: 05/18/1966 (57 y.o. F) Primary Care Provider: [name] Other Clinician: Referring Provider: Treating Provider/Extender: Delora Fuel in Treatment: 53 History of Present Illness HPI Description: 06-13-2022 upon evaluation today patient presents for evaluation of her right heel ulcer. She is having a tremendous amount of pain at this location. She tells me that her most recent hemoglobin A1c was 8.7 and that was on 08-23-2021. This ulcer on the heel has been present she states since February 2023 when she had an injection to the heel by podiatry. She states that she began to have increasing pain the wound opened and has been open ever since. 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 She had an x-ray in June if anything can although there did not appear to be any obvious evidence of osteomyelitis at that point. She subsequently has had OR debridement several times of the wound performed by podiatry. Patient does have a history of lymphedema of the lower extremities she is also a type II diabetic. 06-19-2022 upon evaluation today patient appears to be doing better in regard to the size of her leg which is significantly improved. With that being said she had a lot of drainage from the heel which is completely understandable considering what is going on at this time. There does not appear to be any evidence of active infection there is no warmth or irritation to the leg in general. With that being said the patient does have an issue here with the amount of drainage that is going on I definitely think Zetuvit is good to be the better way to go in place of ABD pads. Also  think that she could potentially benefit from 3 times per week dressing changes but again right now we are going to stick with the to change it today, change in her Friday, and then subsequently seeing where things stand next Wednesday. The other option would be to change her to a different day for wound care having her come then say like a Tuesday Friday or Monday Thursday. 06-26-2022 upon evaluation patient's wound bed actually showed signs of doing well in regard to the overall size it was measuring slightly smaller. With that being said unfortunately the biggest issue we see is she still has a tremendous amount of drainage which is what could keep this from being able to use a total contact cast. For that reason I am did going to discuss with her today the possibility of trying Tubigrip to see if that could be of benefit for her. She voiced understanding and is in agreement with giving this a try. 07-03-2022 upon evaluation today patient unfortunately appears to be doing significantly worse at this point in regard to her swelling due to the fact that she was unable to really wear the Tubigrip. She tells me that it was cutting into her. With that being said she also did have an MRI this MRI revealed that she does have osteomyelitis in the heel this was actually just performed Monday, 25 September. This does show signs of "early osteomyelitis". Nonetheless this is still concerning to me. The wound also appears to be getting deeper in the center aspect of this which has me a little concerned as well. I do believe that she  is likely going require an aggressive approach here to try to get things better. I discussed that in greater detail with her today. I will detailed in the plan. 07-17-2022 upon evaluation today patient appears to be doing poorly in regard to her wound. Unfortunately she does not show any signs of infection systemically though locally this seems to be doing worse with the wound actually  being bigger than where it was previous. She did have an MRI that showed evidence of osteomyelitis actually recommended a referral to ID she was evaluated infectious disease and they recommended referral to podiatry and to be honest have recommended amputation based on what the patient tells me today. With that being said this is something that she is not interested in at all to be perfectly honest. For that reason she wants to know what she should do and where she should go at this point that is where the majority of the conversation which was quite lengthy today went. Obviously understand her concern here but I think she is going to have to definitely get off this and likely this means she is going to need to come out of work. 10/18; Since the patient was the patient is using Hydrofera Blue on her right heel wound. She has a new larger open area superior to the wound. The skin on the bottom of her foot is completely macerated. I reviewed the infectious disease note on this patient from 10/9. They recommended keeping the patient on Delofloxacin 450 twice daily until follow-up tomorrow. The patient states she is not going back there as the only thing they wanted to do was "cut my leg off]. She has not seen podiatry. Her lab works shows a CRP markedly elevated at 240.2 a sedimentation rate of 96 the rest of her CBC is normal creatinine of 0.38 The patient has been to see her primary doctor who is Dr. Leonides Schanz [[facility]] In [place]. According the patient Dr. Loralee Pacas has ordered vancomycin and Zosyn to start on November 1. She is not taking the delofloxacin that was suggested by infectious disease. Very angry that they just wanted to consider her for an amputation although I do not specifically see that stated in their note 07-31-2022 upon evaluation today patient still has a significant wound over her plantar aspect of her right l foot region. With that being said I am definitely concerned about the fact that the  patient probably does need to be in hyperbaric oxygen therapy at this point. I think we should try to get this going as quickly as possible. She actually is going to be set up with a PICC line next Wednesday and then following this we will have the vancomycin and Zosyn that she will be taking for 8 weeks according to what she tells me. I think this is definitely going to be beneficial and again the goal here is limb salvage. In combination with that I think the hyperbaric oxygen therapy would be ultimately significantly helpful for her. 08-07-2022 upon evaluation today patient appears to still be doing quite poorly overall she still has not gotten her antibiotics. She got her PICC line today which I assumed she will be getting the first dose of antibiotics as well during that time. With that being said unfortunately she did not get the antibiotics and in fact as I questioned her further she does not even know when or if that is going to be started this week. Again I am very concerned about this considering she  already has a PICC line in place we do not want this to clot off on top of the fact that she should already be on these antibiotics she just never went to the ER for evaluation therefore they never got this started. At this point based on what I am seeing I really think that the ideal thing would be for [place] to see about getting her to the ER for further evaluation and treatment to see if they can get this started for her as soon as possible. The patient voiced understanding and is in agreement with the plan. I gave her recommendations for what should be done also called [facility] infusion therapy in order to see if they would be the ones that seem to be available for her IV infusions but they tell me that they no longer do this that is apparently where the orders were sent to for her infusion therapy. That was by her primary care provider. 08-21-2022 upon evaluation today patient's wound on the bottom  of the heel and foot area as well as the toe appear to be doing significantly better. Fortunately Shill, Shaunette ([id number]) [id number] Page 2 of 10 I do not see any evidence of infection locally or systemically and everything is measuring much smaller than where we have been. The wound in the left gluteal region also is dramatically improved compared to last time she was here. She has started the antibiotics this is cefepime and vancomycin. She did have a somewhat high trough and therefore they have her hold that today and they are to recheck in the morning I believe she told me. Nonetheless with the antibiotics going she seems to be doing significantly better which is great news. 08-28-2022 upon evaluation today patient appears to be doing somewhat better in regard to her foot there is definitely less drainage than what we have seen in the past. I do believe the antibiotics are helping. With that being said she is also been approved for hyperbaric oxygen therapy and I think as soon as we get this started the better. I discussed that with her today as well. With that being said I do think that the last thing she needs is her chest x-ray when she has that done will be ready to start her into treatment. She is going to try to go get that today when she leaves which I think would be ideal. 09-11-2022 upon evaluation today patient appears to be doing well with regard to her foot ulcer. This is showing signs of no digression and she does not appear to be having as much drainage as before though it still very wet is not nearly the extreme of what it was previous. Fortunately there does not appear to be any signs of active infection locally nor systemically at this point which is great news. Patient did have her chest x-ray as well as EKG and she is actually approved and completely cleared for HBO therapy from both a clinical standpoint as well as an insurance standpoint. 09-25-2022  upon evaluation today patient appears to be doing well in regard to her foot ulcer. Unfortunately she is not doing as well when it comes to her hyperbaric treatments. She is having a lot of trouble with claustrophobia. She tells me in general that she is not sure she is good to be able to continue. 10-09-2022 upon evaluation today patient's wounds actually are showing signs of excellent improvement which is great news. Fortunately I do not see any evidence of  infection locally or systemically which is great news and overall I am extremely pleased with where things are currently. I do think that she is making good progress she does tell me at this point today she does not want to go forward with a hyperbaric oxygen therapy. 10-16-2022 upon evaluation today patient appears to be doing excellent in regard to her foot ulcer. This is actually showing signs of excellent improvement. She still does not allow for any sharp debridement but the good news is this is doing much better and does not even need it at the moment. Nonetheless as far as sharp debridement is concerned this has been too painful for really not been able to do that all along. 10-23-2022 upon evaluation today patient actually is making excellent progress. I am extremely pleased with where we stand I have considered even seeing about putting her in the total contact cast but to be honest she is doing so well currently and still with having some drainage I feel like it is better for her to be able to change it then to be locked up in the cast for a week at a time. She has voiced understanding as well. Overall though I feel like you are making some really good progress here. 10-30-2022 upon evaluation today patient appears to be doing excellent in regard to her foot ulcers. Both are showing signs of improvement which is great news and overall I feel like that they are measuring smaller, looking better, and I see no signs of resurgence of the infection  which is great news. Overall I am extremely pleased at this point. 2/7; the patient has 2 wounds which are remanence of the large wound on the right foot. This was a diabetic wound with underlying osteomyelitis she has completed antibiotics. Today she has the open area on the distal calcaneus and an area on the right midfoot which is eschared. She has been using silver alginate and offloading this in a regular running shoe. 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 11-27-2022 upon evaluation today patient appears to be doing well currently in regard to her wound which is actually showing signs of significant improvement this is great news. Fortunately there does not appear to be any signs of active infection locally nor systemically which is also excellent. No fevers, chills, nausea, vomiting, or diarrhea. 12-25-2022 upon evaluation today patient appears to be doing well currently in regard to the distal portion of her foot which is actually in my opinion looking a little better but have a lot of callus she is going require some debridement here. With that being said the heel actually looks like it might be a little bit worse. There is a lot of callus I cannot tell exactly what is open or not we can remove that and we will see what exactly were dealing with there. With that being said she also has an area on the right side of her leg which appears to be more of a pressure injury she tells me this is from sitting in her recliner she has gotten a pillow to help take care of this. Fortunately it is not technically open and draining at this point made it very well may be before it is also not done. 01-01-2023 upon evaluation today patient appears to be doing well currently in regard to her wounds I feel like the cefdinir has helped she seems to be improved  compared to last week's evaluation. I do not see any signs of active infection  locally nor systemically at this time which is great news. No fevers, chills, nausea, vomiting, or diarrhea. 01-08-2023 upon evaluation today patient's wounds on the foot appear to be doing decently well the leg is still about the same. With that being said unfortunately she does have a lot of erythema on the leg in particular which is unfortunate and definitely not what we are looking for. Again I do not think that the [facility] has really done well for her in that regard. 01-15-2023 upon evaluation today patient appears to be doing well currently in regard to her wound. Overall I think that things are measuring a little bit better but unfortunately the erythema is spreading. I did review her culture today. Unfortunately it does show that she does have MRSA noted and again I think that we need to do something to try to get this under control as quickly as possible. She has previously been on IV antibiotics and unfortunately that has not been sufficient to get this completely cleared it seems to have come back at this point and she really wants to avoid going back on IV antibiotics. We have been using oral antibiotics and various forms the most recent was actually a course of cefdinir. She is also previously been on clindamycin and even in the past she has had to doxycycline. With that being said with all things considered I think we are still having a lot of trouble here keeping this infection under control. We previously attempted hyperbarics but the patient did not tolerate this well. She therefore discontinued HBO therapy. 01-22-2023 upon evaluation today patient appears to be doing a little better in regard to the cellulitis on her right leg. I am much more pleased with what I am seeing today. With that being said unfortunately she is still having quite a bit of issues here with erythema though not as bad I think that were not completely clear. Unfortunately we were denied for the [facility] prescription.  She has the medication but I provided for her by way of samples but nothing further. 01-29-2023 upon evaluation today patient appears to be doing poorly currently in regard to her leg. I do not feel like she is showing as much improvement as she was within desirable now that we switch her to linezolid. With that being said I do believe that we probably need to see about IV antibiotics I recommended a referral back to ID. With that being said she would prefer to see her physician which I am okay with but either way I think she needs to be back on IV antibiotics for a minimum of 2 months as soon as possible. I am going to send her for lab work today in order to get some baseline labs but honestly I think that we are going to require IV antibiotics to get this under control. 02-12-2023 upon evaluation today patient appears to be doing well currently in regard to her wound all things considered. She has not gotten the lab work done yet that we ordered a couple weeks back. She tells me she has had a lot going on with the death of the family. Fortunately there does not appear to be any signs of worsening in fact some of the heel looks a little bit better which is great news. Fortunately I do not see any evidence of active infection locally nor systemically which is great news. 02-19-24 upon  evaluation today patient appears to be doing well currently in regard to her wounds. She has been tolerating the dressing changes without complication. Fortunately there does not appear to be any signs of active infection locally nor systemically which is great news. No fevers, chills, nausea, vomiting, or diarrhea. She is currently on vancomycin and Zosyn through IV which is doing I think well she has been on it for not quite a week at this point. 03-05-2023 upon evaluation today patient appears to be doing well currently in regard to her wound. She has been tolerating the dressing changes all 3 locations actually are  significantly smaller even compared to last week I am very pleased with where we stand. I think the antibiotics are really doing a good job here for her. 03-12-2023 upon evaluation today patient has actually continued to make excellent progress in regard to her wounds. I am very pleased with where we stand in fact I think her leg is healed the middle part of her foot I think is close to being closed and the heel does look better though there is some callus that needs to be removed at this point. I did discuss that with her today. Kotara, [name] ([id number]) [id number] Page 3 of 10 03-19-2023 upon evaluation today patient's wounds are actually showing signs of excellent improvement. I am actually very pleased with where we stand and I think that she is making really good progress here. I do not see any signs of active infection locally nor systemically which is great news. 03-26-2023 upon evaluation today patient's wounds actually are showing signs of improvement in fact the middle of her foot is pretty much appearing to be completely healed which is great news. This means we are just dealing with the heel itself on the bottom of the foot as far as anything remaining is concerned at this point. I am extremely pleased with where we stand at this time. 04-09-2023 upon evaluation today patient appears to be doing well currently in regard to the periwound. She has been tolerating the dressing changes without complication. Fortunately there does not appear to be any signs of active infection locally or systemically at this time. Potentially seeing about getting a total contact cast on and try to get this closed as quickly as possible. I think the casting is probably our best bet at this point and the patient is in agreement with that plan. At this point based on what I am seeing I do believe that the patient would benefit from a continuation of care with regard to dressing  changes regularly. 04-16-2023 upon evaluation today patient appears to be doing well currently in regard to her wound which is actually showing signs of good improvement. Fortunately I do not see any evidence of active infection locally or systemically which is great news and in general I do believe that we are making good headway towards complete closure. I do think she is a candidate however for total contact cast which I think will speed up the closure process. 7/12; patient presents for follow-up. She is here for our obligatory cast change. She had the cast first placed two days ago and Hydrofera Blue was under the cast. She has no issues or complaints today. 04-23-2023 upon evaluation today patient appears to be doing well currently in regard to her wound. She is actually showing signs of excellent improvement and in general I feel like that we are making great headway towards closure. Her wound is significantly improved compared  to last week when I saw her the cast is doing great despite the fact got so loose that today the cast actually slipped off without [place] even having to cut it with a cast saw. Obviously she is lost quite a bit of fluid in fact we did for inch cast last time she is definitely going to be in the 3 today she has lost a tremendous amount of fluid in this leg which is actually really good news. I do not see any signs of active infection locally nor systemically at this point. 7/24; patient came in today with a size 3 total contact cast quite loose in fact it came off without being manually cut off. This is no doubt due to reduction in the amount of edema in her right lower leg. This should not be an ongoing issue. The wound is on the plantar right heel it was originally due to a injection she had for plantar fasciitis according to the patient. Her wound has been doing Actor) Signed: 04/30/2023 4:12:50 PM By: Baltazar Najjar MD Entered By: Baltazar Najjar on  04/30/2023 21:30:86 -------------------------------------------------------------------------------- Physical Exam Details Patient Name: Date of Service: JAVIANA, ANWAR 04/30/2023 8:45 A M Medical Record Number: [id number] Patient Account Number: [id number] Date of Birth/Sex: Treating RN: 03/02/1966 (57 y.o. F) Primary Care Provider: [name] Other Clinician: Referring Provider: Treating Provider/Extender: Delora Fuel in Treatment: 46 Constitutional Sitting or standing Blood Pressure is within target range for patient.. Pulse regular and within target range for patient.Marland Kitchen Respirations regular, non-labored and within target range.. Temperature is normal and within the target range for the patient.Marland Kitchen Appears in no distress. Notes Wound exam; small wound with some depth on the mid part of the plantar heel. Base of the wound looks quite healthy no aggressive mechanical debridement is required. No evidence of surrounding infection. Electronic Signature(s) Signed: 04/30/2023 4:12:50 PM By: Baltazar Najjar MD Entered By: Baltazar Najjar on 04/30/2023 09:29:41 -------------------------------------------------------------------------------- Physician Orders Details Patient Name: Date of Service: OLAMAE, FERRARA 04/30/2023 8:45 A M Medical Record Number: [id number] Patient Account Number: [id number] Date of Birth/Sex: Treating RN: 02-02-1966 (57 y.o. [name] Primary Care Provider: [name] Other Clinician: Referring Provider: Treating Provider/Extender: Jen Mow, Lavere ([id number]) [phone].pdf Page 4 of 10 Weeks in Treatment: 63 Verbal / Phone Orders: No Diagnosis Coding ICD-10 Coding Code Description [phone] Other chronic osteomyelitis, right ankle and foot E11.621 Type 2 diabetes mellitus with foot ulcer L97.412 Non-pressure chronic ulcer of right heel and midfoot with fat layer exposed I89.0  Lymphedema, not elsewhere classified L97.812 Non-pressure chronic ulcer of other part of right lower leg with fat layer exposed Follow-up Appointments ppointment in 1 week. Allen Derry III PA-C Wednesday 05/07/23 at 0900am (already scheduled) Return A ppointment in 2 weeks. Leonard Schwartz Wednesday (already scheduled) Return A Anesthetic (In clinic) Topical Lidocaine 5% applied to wound bed Bathing/ Shower/ Hygiene May shower with protection but do not get wound dressing(s) wet. Protect dressing(s) with water repellant cover (for example, large plastic bag) or a cast cover and may then take shower. Edema Control - Lymphedema / SCD / Other Elevate legs to the level of the heart or above for 30 minutes daily and/or when sitting for 3-4 times a day throughout the day. Avoid standing for long periods of time. Off-Loading Total Contact Cast to Right Lower Extremity - TCC #3 Wound Treatment Wound #1 - Calcaneus Wound Laterality: Right Cleanser: Soap and Water 1 x Per Week/30  Days Discharge Instructions: May shower and wash wound with dial antibacterial soap and water prior to dressing change. Cleanser: Wound Cleanser (Generic) 1 x Per Week/30 Days Discharge Instructions: Cleanse the wound with wound cleanser prior to applying a clean dressing using gauze sponges, not tissue or cotton balls. Peri-Wound Care: Sween Lotion (Moisturizing lotion) 1 x Per Week/30 Days Discharge Instructions: Apply moisturizing lotion as directed Prim Dressing: Promogran Prisma Matrix, 4.34 (sq in) (silver collagen) 1 x Per Week/30 Days ary Discharge Instructions: Moisten collagen with saline or hydrogel Secondary Dressing: Woven Gauze Sponge, Non-Sterile 4x4 in (Generic) 1 x Per Week/30 Days Discharge Instructions: Apply over primary dressing as directed. Secondary Dressing: Zetuvit Plus 4x8 in 1 x Per Week/30 Days Discharge Instructions: Apply over primary dressing as directed. Secured With: [employer], 4.5x3.1  (in/yd) 1 x Per Week/30 Days Discharge Instructions: Secure with Kerlix as directed. Secured With: 62M Medipore H Soft Cloth Surgical T ape, 4 x 10 (in/yd) 1 x Per Week/30 Days Discharge Instructions: Secure with tape as directed. Electronic Signature(s) Signed: 04/30/2023 4:12:50 PM By: Baltazar Najjar MD Signed: 04/30/2023 6:08:49 PM By: Shawn Stall RN, BSN Entered By: Shawn Stall on 04/30/2023 09:22:07 -------------------------------------------------------------------------------- Problem List Details Patient Name: Date of Service: AMARI, BURNSWORTH 04/30/2023 8:45 A Hyman Bower, Thalia ([id number]) [id number] Page 5 of 10 Medical Record Number: [id number] Patient Account Number: [id number] Date of Birth/Sex: Treating RN: 09-03-1966 (57 y.o. 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 06/12/2022 No Yes M86.671 Other chronic osteomyelitis, right ankle and foot 08/28/2022 No Yes E11.621 Type 2 diabetes mellitus with foot ulcer 06/12/2022 No Yes I89.0 Lymphedema, not elsewhere classified 06/12/2022 No Yes L97.812 Non-pressure chronic ulcer of other part of right lower leg with fat layer 03/05/2023 No Yes exposed Inactive Problems Resolved Problems Electronic Signature(s) Signed: 04/30/2023 4:12:50 PM By: Baltazar Najjar MD Entered By: Baltazar Najjar on 04/30/2023 09:27:08 -------------------------------------------------------------------------------- Progress Note Details Patient Name: Date of Service: NADEAN, MONTANARO 04/30/2023 8:45 A M Medical Record Number: [id number] Patient Account Number: [id number] Date of Birth/Sex: Treating RN: [birth year] (57 y.o. F) Primary Care Provider:  [name] Other Clinician: Referring Provider: Treating Provider/Extender: Delora Fuel in Treatment: 46 Subjective History of Present Illness (HPI) 06-13-2022 upon evaluation today patient presents for evaluation of her right heel ulcer. She is having a tremendous amount of pain at this location. She tells me that her most recent hemoglobin A1c was 8.7 and that was on 08-23-2021. This ulcer on the heel has been present she states since February 2023 when she had an injection to the heel by podiatry. She states that she began to have increasing pain the wound opened and has been open ever since. 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 She had an x-ray in June if anything can although there did not appear to be any obvious evidence of osteomyelitis at that point. She subsequently has had OR debridement several times of the wound performed by podiatry. Patient does have a history of lymphedema of the lower extremities she is also a type II diabetic. 06-19-2022 upon evaluation today patient appears to be doing better in regard to the size of her leg which is significantly improved. With that being said she had a lot of drainage from the  heel which is completely understandable considering what is going on at this time. There does not appear to be any evidence of active infection there is no warmth or irritation to the leg in general. With that being said the patient does have an issue here with the amount of drainage that is going on I definitely think Zetuvit is good to be the better way to go in place of ABD pads. Also think that she could potentially benefit from 3 times per week dressing changes but again right now we are going to stick with the to change it today, change in her Friday, and then subsequently seeing where things stand next Wednesday. The other option would be to change her to a different day for wound care having her come then  say like a Tuesday Friday or Monday Thursday. Upton, [name] ([id number]) [id number] Page 6 of 10 06-26-2022 upon evaluation patient's wound bed actually showed signs of doing well in regard to the overall size it was measuring slightly smaller. With that being said unfortunately the biggest issue we see is she still has a tremendous amount of drainage which is what could keep this from being able to use a total contact cast. For that reason I am did going to discuss with her today the possibility of trying Tubigrip to see if that could be of benefit for her. She voiced understanding and is in agreement with giving this a try. 07-03-2022 upon evaluation today patient unfortunately appears to be doing significantly worse at this point in regard to her swelling due to the fact that she was unable to really wear the Tubigrip. She tells me that it was cutting into her. With that being said she also did have an MRI this MRI revealed that she does have osteomyelitis in the heel this was actually just performed Monday, 25 September. This does show signs of "early osteomyelitis". Nonetheless this is still concerning to me. The wound also appears to be getting deeper in the center aspect of this which has me a little concerned as well. I do believe that she is likely going require an aggressive approach here to try to get things better. I discussed that in greater detail with her today. I will detailed in the plan. 07-17-2022 upon evaluation today patient appears to be doing poorly in regard to her wound. Unfortunately she does not show any signs of infection systemically though locally this seems to be doing worse with the wound actually being bigger than where it was previous. She did have an MRI that showed evidence of osteomyelitis actually recommended a referral to ID she was evaluated infectious disease and they recommended referral to podiatry and to be honest have recommended  amputation based on what the patient tells me today. With that being said this is something that she is not interested in at all to be perfectly honest. For that reason she wants to know what she should do and where she should go at this point that is where the majority of the conversation which was quite lengthy today went. Obviously understand her concern here but I think she is going to have to definitely get off this and likely this means she is going to need to come out of work. 10/18; Since the patient was the patient is using Hydrofera Blue on her right heel wound. She has a new larger open area superior to the wound. The skin on the bottom of her foot is completely macerated. I reviewed the  infectious disease note on this patient from 10/9. They recommended keeping the patient on Delofloxacin 450 twice daily until follow-up tomorrow. The patient states she is not going back there as the only thing they wanted to do was "cut my leg off]. She has not seen podiatry. Her lab works shows a CRP markedly elevated at 240.2 a sedimentation rate of 96 the rest of her CBC is normal creatinine of 0.38 The patient has been to see her primary doctor who is Dr. Leonides Schanz [SPo] In [place]. According the patient Dr. Loralee Pacas has ordered vancomycin and Zosyn to start on November 1. She is not taking the delofloxacin that was suggested by infectious disease. Very angry that they just wanted to consider her for an amputation although I do not specifically see that stated in their note 07-31-2022 upon evaluation today patient still has a significant wound over her plantar aspect of her right l foot region. With that being said I am definitely concerned about the fact that the patient probably does need to be in hyperbaric oxygen therapy at this point. I think we should try to get this going as quickly as possible. She actually is going to be set up with a PICC line next Wednesday and then following this we will have the  vancomycin and Zosyn that she will be taking for 8 weeks according to what she tells me. I think this is definitely going to be beneficial and again the goal here is limb salvage. In combination with that I think the hyperbaric oxygen therapy would be ultimately significantly helpful for her. 08-07-2022 upon evaluation today patient appears to still be doing quite poorly overall she still has not gotten her antibiotics. She got her PICC line today which I assumed she will be getting the first dose of antibiotics as well during that time. With that being said unfortunately she did not get the antibiotics and in fact as I questioned her further she does not even know when or if that is going to be started this week. Again I am very concerned about this considering she already has a PICC line in place we do not want this to clot off on top of the fact that she should already be on these antibiotics she just never went to the ER for evaluation therefore they never got this started. At this point based on what I am seeing I really think that the ideal thing would be for [place] to see about getting her to the ER for further evaluation and treatment to see if they can get this started for her as soon as possible. The patient voiced understanding and is in agreement with the plan. I gave her recommendations for what should be done also called [facility] infusion therapy in order to see if they would be the ones that seem to be available for her IV infusions but they tell me that they no longer do this that is apparently where the orders were sent to for her infusion therapy. That was by her primary care provider. 08-21-2022 upon evaluation today patient's wound on the bottom of the heel and foot area as well as the toe appear to be doing significantly better. Fortunately I do not see any evidence of infection locally or systemically and everything is measuring much smaller than where we have been. The wound in the  left gluteal region also is dramatically improved compared to last time she was here. She has started the antibiotics this is cefepime and  vancomycin. She did have a somewhat high trough and therefore they have her hold that today and they are to recheck in the morning I believe she told me. Nonetheless with the antibiotics going she seems to be doing significantly better which is great news. 08-28-2022 upon evaluation today patient appears to be doing somewhat better in regard to her foot there is definitely less drainage than what we have seen in the past. I do believe the antibiotics are helping. With that being said she is also been approved for hyperbaric oxygen therapy and I think as soon as we get this started the better. I discussed that with her today as well. With that being said I do think that the last thing she needs is her chest x-ray when she has that done will be ready to start her into treatment. She is going to try to go get that today when she leaves which I think would be ideal. 09-11-2022 upon evaluation today patient appears to be doing well with regard to her foot ulcer. This is showing signs of no digression and she does not appear to be having as much drainage as before though it still very wet is not nearly the extreme of what it was previous. Fortunately there does not appear to be any signs of active infection locally nor systemically at this point which is great news. Patient did have her chest x-ray as well as EKG and she is actually approved and completely cleared for HBO therapy from both a clinical standpoint as well as an insurance standpoint. 09-25-2022 upon evaluation today patient appears to be doing well in regard to her foot ulcer. Unfortunately she is not doing as well when it comes to her hyperbaric treatments. She is having a lot of trouble with claustrophobia. She tells me in general that she is not sure she is good to be able to continue. 10-09-2022 upon  evaluation today patient's wounds actually are showing signs of excellent improvement which is great news. Fortunately I do not see any evidence of infection locally or systemically which is great news and overall I am extremely pleased with where things are currently. I do think that she is making good progress she does tell me at this point today she does not want to go forward with a hyperbaric oxygen therapy. 10-16-2022 upon evaluation today patient appears to be doing excellent in regard to her foot ulcer. This is actually showing signs of excellent improvement. She still does not allow for any sharp debridement but the good news is this is doing much better and does not even need it at the moment. Nonetheless as far as sharp debridement is concerned this has been too painful for really not been able to do that all along. 10-23-2022 upon evaluation today patient actually is making excellent progress. I am extremely pleased with where we stand I have considered even seeing about putting her in the total contact cast but to be honest she is doing so well currently and still with having some drainage I feel like it is better for her to be able to change it then to be locked up in the cast for a week at a time. She has voiced understanding as well. Overall though I feel like you are making some really good progress here. 10-30-2022 upon evaluation today patient appears to be doing excellent in regard to her foot ulcers. Both are showing signs of improvement which is great news and overall I feel like that  they are measuring smaller, looking better, and I see no signs of resurgence of the infection which is great news. Overall I am extremely pleased at this point. 2/7; the patient has 2 wounds which are remanence of the large wound on the right foot. This was a diabetic wound with underlying osteomyelitis she has completed antibiotics. Today she has the open area on the distal calcaneus and an area on the  right midfoot which is eschared. She has been using silver alginate and offloading this in a regular running shoe. 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 11-27-2022 upon evaluation today patient appears to be doing well currently in regard to her wound which is actually showing signs of significant improvement this is great news. Fortunately there does not appear to be any signs of active infection locally nor systemically which is also excellent. No fevers, chills, nausea, vomiting, or diarrhea. Angelica, [name] ([id number]) [id number] Page 7 of 10 12-25-2022 upon evaluation today patient appears to be doing well currently in regard to the distal portion of her foot which is actually in my opinion looking a little better but have a lot of callus she is going require some debridement here. With that being said the heel actually looks like it might be a little bit worse. There is a lot of callus I cannot tell exactly what is open or not we can remove that and we will see what exactly were dealing with there. With that being said she also has an area on the right side of her leg which appears to be more of a pressure injury she tells me this is from sitting in her recliner she has gotten a pillow to help take care of this. Fortunately it is not technically open and draining at this point made it very well may be before it is also not done. 01-01-2023 upon evaluation today patient appears to be doing well currently in regard to her wounds I feel like the cefdinir has helped she seems to be improved compared to last week's evaluation. I do not see any signs of active infection locally nor systemically at this time which is great news. No fevers, chills, nausea, vomiting, or diarrhea. 01-08-2023 upon evaluation today patient's wounds on the foot appear to be doing decently well the leg is still about the same. With  that being said unfortunately she does have a lot of erythema on the leg in particular which is unfortunate and definitely not what we are looking for. Again I do not think that the [facility] has really done well for her in that regard. 01-15-2023 upon evaluation today patient appears to be doing well currently in regard to her wound. Overall I think that things are measuring a little bit better but unfortunately the erythema is spreading. I did review her culture today. Unfortunately it does show that she does have MRSA noted and again I think that we need to do something to try to get this under control as quickly as possible. She has previously been on IV antibiotics and unfortunately that has not been sufficient to get this completely cleared it seems to have come back at this point and she really wants to avoid going back on IV antibiotics. We have been using oral antibiotics and various forms the most recent was actually a course of cefdinir. She is also previously been on clindamycin and even in the past she has had  to doxycycline. With that being said with all things considered I think we are still having a lot of trouble here keeping this infection under control. We previously attempted hyperbarics but the patient did not tolerate this well. She therefore discontinued HBO therapy. 01-22-2023 upon evaluation today patient appears to be doing a little better in regard to the cellulitis on her right leg. I am much more pleased with what I am seeing today. With that being said unfortunately she is still having quite a bit of issues here with erythema though not as bad I think that were not completely clear. Unfortunately we were denied for the [facility] prescription. She has the medication but I provided for her by way of samples but nothing further. 01-29-2023 upon evaluation today patient appears to be doing poorly currently in regard to her leg. I do not feel like she is showing as much improvement  as she was within desirable now that we switch her to linezolid. With that being said I do believe that we probably need to see about IV antibiotics I recommended a referral back to ID. With that being said she would prefer to see her physician which I am okay with but either way I think she needs to be back on IV antibiotics for a minimum of 2 months as soon as possible. I am going to send her for lab work today in order to get some baseline labs but honestly I think that we are going to require IV antibiotics to get this under control. 02-12-2023 upon evaluation today patient appears to be doing well currently in regard to her wound all things considered. She has not gotten the lab work done yet that we ordered a couple weeks back. She tells me she has had a lot going on with the death of the family. Fortunately there does not appear to be any signs of worsening in fact some of the heel looks a little bit better which is great news. Fortunately I do not see any evidence of active infection locally nor systemically which is great news. 02-19-24 upon evaluation today patient appears to be doing well currently in regard to her wounds. She has been tolerating the dressing changes without complication. Fortunately there does not appear to be any signs of active infection locally nor systemically which is great news. No fevers, chills, nausea, vomiting, or diarrhea. She is currently on vancomycin and Zosyn through IV which is doing I think well she has been on it for not quite a week at this point. 03-05-2023 upon evaluation today patient appears to be doing well currently in regard to her wound. She has been tolerating the dressing changes all 3 locations actually are significantly smaller even compared to last week I am very pleased with where we stand. I think the antibiotics are really doing a good job here for her. 03-12-2023 upon evaluation today patient has actually continued to make excellent progress  in regard to her wounds. I am very pleased with where we stand in fact I think her leg is healed the middle part of her foot I think is close to being closed and the heel does look better though there is some callus that needs to be removed at this point. I did discuss that with her today. 03-19-2023 upon evaluation today patient's wounds are actually showing signs of excellent improvement. I am actually very pleased with where we stand and I think that she is making really good progress here. I do  not see any signs of active infection locally nor systemically which is great news. 03-26-2023 upon evaluation today patient's wounds actually are showing signs of improvement in fact the middle of her foot is pretty much appearing to be completely healed which is great news. This means we are just dealing with the heel itself on the bottom of the foot as far as anything remaining is concerned at this point. I am extremely pleased with where we stand at this time. 04-09-2023 upon evaluation today patient appears to be doing well currently in regard to the periwound. She has been tolerating the dressing changes without complication. Fortunately there does not appear to be any signs of active infection locally or systemically at this time. Potentially seeing about getting a total contact cast on and try to get this closed as quickly as possible. I think the casting is probably our best bet at this point and the patient is in agreement with that plan. At this point based on what I am seeing I do believe that the patient would benefit from a continuation of care with regard to dressing changes regularly. 04-16-2023 upon evaluation today patient appears to be doing well currently in regard to her wound which is actually showing signs of good improvement. Fortunately I do not see any evidence of active infection locally or systemically which is great news and in general I do believe that we are making good headway  towards complete closure. I do think she is a candidate however for total contact cast which I think will speed up the closure process. 7/12; patient presents for follow-up. She is here for our obligatory cast change. She had the cast first placed two days ago and Hydrofera Blue was under the cast. She has no issues or complaints today. 04-23-2023 upon evaluation today patient appears to be doing well currently in regard to her wound. She is actually showing signs of excellent improvement and in general I feel like that we are making great headway towards closure. Her wound is significantly improved compared to last week when I saw her the cast is doing great despite the fact got so loose that today the cast actually slipped off without [place] even having to cut it with a cast saw. Obviously she is lost quite a bit of fluid in fact we did for inch cast last time she is definitely going to be in the 3 today she has lost a tremendous amount of fluid in this leg which is actually really good news. I do not see any signs of active infection locally nor systemically at this point. 7/24; patient came in today with a size 3 total contact cast quite loose in fact it came off without being manually cut off. This is no doubt due to reduction in the amount of edema in her right lower leg. This should not be an ongoing issue. The wound is on the plantar right heel it was originally due to a injection she had for plantar fasciitis according to the patient. Her wound has been doing well Objective Constitutional Kiger, Kiwanna ([id number]) [id number] Page 8 of 10 Sitting or standing Blood Pressure is within target range for patient.. Pulse regular and within target range for patient.Marland Kitchen Respirations regular, non-labored and within target range.. Temperature is normal and within the target range for the patient.Marland Kitchen Appears in no distress. Vitals Time Taken: 8:56 AM, Weight: 331 lbs, Temperature:  98.3 F, Pulse: 97 bpm, Respiratory Rate: 18 breaths/min, Blood Pressure: 123/76 mmHg. General  Notes: Wound exam; small wound with some depth on the mid part of the plantar heel. Base of the wound looks quite healthy no aggressive mechanical debridement is required. No evidence of surrounding infection. Integumentary (Hair, Skin) Wound #1 status is Open. Original cause of wound was Gradually Appeared. The date acquired was: 11/07/2020. The wound has been in treatment 46 weeks. The wound is located on the Right Calcaneus. The wound measures 0.5cm length x 0.5cm width x 0.2cm depth; 0.196cm^2 area and 0.039cm^3 volume. There is Fat Layer (Subcutaneous Tissue) exposed. There is no tunneling or undermining noted. There is a medium amount of serosanguineous drainage noted. The wound margin is thickened. There is large (67-100%) red, pink granulation within the wound bed. There is a small (1-33%) amount of necrotic tissue within the wound bed including Adherent Slough. The periwound skin appearance exhibited: Callus. The periwound skin appearance did not exhibit: Crepitus, Excoriation, Induration, Rash, Scarring, Dry/Scaly, Maceration, Atrophie Blanche, Cyanosis, Ecchymosis, Hemosiderin Staining, Mottled, Pallor, Rubor, Erythema. Periwound temperature was noted as No Abnormality. The periwound has tenderness on palpation. Assessment Active Problems ICD-10 Non-pressure chronic ulcer of right heel and midfoot with fat layer exposed Other chronic osteomyelitis, right ankle and foot Type 2 diabetes mellitus with foot ulcer Lymphedema, not elsewhere classified Non-pressure chronic ulcer of other part of right lower leg with fat layer exposed Procedures Wound #1 Pre-procedure diagnosis of Wound #1 is a Diabetic Wound/Ulcer of the Lower Extremity located on the Right Calcaneus . There was a T Contact Cast otal Procedure by Maxwell Caul., MD. Post procedure Diagnosis Wound #1: Same as  Pre-Procedure Notes: size 3. Plan Follow-up Appointments: Return Appointment in 1 week. Allen Derry III PA-C Wednesday 05/07/23 at 0900am (already scheduled) Return Appointment in 2 weeks. Leonard Schwartz Wednesday (already scheduled) Anesthetic: (In clinic) Topical Lidocaine 5% applied to wound bed Bathing/ Shower/ Hygiene: May shower with protection but do not get wound dressing(s) wet. Protect dressing(s) with water repellant cover (for example, large plastic bag) or a cast cover and may then take shower. Edema Control - Lymphedema / SCD / Other: Elevate legs to the level of the heart or above for 30 minutes daily and/or when sitting for 3-4 times a day throughout the day. Avoid standing for long periods of time. Off-Loading: T Contact Cast to Right Lower Extremity - TCC #3 otal WOUND #1: - Calcaneus Wound Laterality: Right Cleanser: Soap and Water 1 x Per Week/30 Days Discharge Instructions: May shower and wash wound with dial antibacterial soap and water prior to dressing change. Cleanser: Wound Cleanser (Generic) 1 x Per Week/30 Days Discharge Instructions: Cleanse the wound with wound cleanser prior to applying a clean dressing using gauze sponges, not tissue or cotton balls. Peri-Wound Care: Sween Lotion (Moisturizing lotion) 1 x Per Week/30 Days Discharge Instructions: Apply moisturizing lotion as directed Prim Dressing: Promogran Prisma Matrix, 4.34 (sq in) (silver collagen) 1 x Per Week/30 Days ary Discharge Instructions: Moisten collagen with saline or hydrogel Secondary Dressing: Woven Gauze Sponge, Non-Sterile 4x4 in (Generic) 1 x Per Week/30 Days Discharge Instructions: Apply over primary dressing as directed. Secondary Dressing: Zetuvit Plus 4x8 in 1 x Per Week/30 Days Discharge Instructions: Apply over primary dressing as directed. Secured With: [employer], 4.5x3.1 (in/yd) 1 x Per Week/30 Days Discharge Instructions: Secure with Kerlix as directed. Secured With: 18M  Medipore H Soft Cloth Surgical T ape, 4 x 10 (in/yd) 1 x Per Week/30 Days Discharge Instructions: Secure with tape as directed. 1. I continued  the collagen based dressings under a total contact cast. 2. T contact cast was reapplied in the standard fashion otal Kroeze, Zsofia ([id number]) [phone].pdf Page 9 of 10 Electronic Signature(s) Signed: 04/30/2023 4:12:50 PM By: Baltazar Najjar MD Entered By: Baltazar Najjar on 04/30/2023 09:30:33 -------------------------------------------------------------------------------- Total Contact Cast Details Patient Name: Date of Service: SINDHU, NGUYEN 04/30/2023 8:45 A M Medical Record Number: [id number] Patient Account Number: [id number] Date of Birth/Sex: Treating RN: 09/02/1966 (57 y.o. F) Primary Care Provider: [name] Other Clinician: Referring Provider: Treating Provider/Extender: Delora Fuel in Treatment: [phone] T Contact Cast Applied for Wound Assessment: otal Wound #1 Right Calcaneus Performed By: Physician Maxwell Caul., MD Post Procedure Diagnosis Same as Pre-procedure Notes size 3 Electronic Signature(s) Signed: 04/30/2023 4:12:50 PM By: Baltazar Najjar MD Entered By: Baltazar Najjar on 04/30/2023 09:27:26 -------------------------------------------------------------------------------- SuperBill Details Patient Name: Date of Service: DWIGHT, BURDO 04/30/2023 Medical Record Number: [id number] Patient Account Number: [id number] Date of Birth/Sex: Treating RN: 08/23/1966 (57 y.o. [name], Millard.Loa Primary Care Provider: [name] Other Clinician: Referring Provider: Treating Provider/Extender: Delora Fuel in Treatment: 46 Diagnosis Coding ICD-10 Codes Code Description [phone] Other chronic osteomyelitis, right ankle and foot E11.621 Type 2 diabetes mellitus with foot ulcer L97.412 Non-pressure chronic ulcer of right heel and midfoot with fat layer  exposed I89.0 Lymphedema, not elsewhere classified L97.812 Non-pressure chronic ulcer of other part of right lower leg with fat layer exposed Facility Procedures : CPT4 Code: [id number] Description: 29445 - APPLY TOTAL CONTACT LEG CAST ICD-10 Diagnosis Description E11.621 Type 2 diabetes mellitus with foot ulcer L97.412 Non-pressure chronic ulcer of right heel and midfoot with fat layer exposed Modifier: Quantity: 1 Physician Procedures Shabazz, Tyera ([id number]): CPT4 Code Description [id number] 29445 - WC PHYS APPLY TOTAL CONTACT CAST ICD-10 Diagnosis Description E11.621 Type 2 diabetes mellitus with foot ulcer L97.412 Non-pressure chronic ulcer of right heel and midfoot with fat [id number] Page 10 of 10: Quantity Modifier 1 layer exposed Electronic Signature(s) Signed: 04/30/2023 4:12:50 PM By: Baltazar Najjar MD Entered By: Baltazar Najjar on 04/30/2023 09:30:45

## 2023-05-07 ENCOUNTER — Encounter (HOSPITAL_BASED_OUTPATIENT_CLINIC_OR_DEPARTMENT_OTHER): Payer: No Typology Code available for payment source | Admitting: Physician Assistant

## 2023-05-07 DIAGNOSIS — E11621 Type 2 diabetes mellitus with foot ulcer: Secondary | ICD-10-CM | POA: Diagnosis not present

## 2023-05-07 NOTE — Progress Notes (Signed)
Volpe, [name] ([id number]) [id number] Page 1 of 2 Visit Report for 05/07/2023 Chief Complaint Document Details Patient Name: Date of Service: [name], [name] 05/07/2023 9:00 A M Medical Record Number: [id number] Patient Account Number: [id number] Date of Birth/Sex: Treating RN: January 06, 1966 (57 y.o. F) Primary Care Provider: [name] Other Clinician: Referring Provider: Treating Provider/Extender: [name] in Treatment: 17 Information Obtained from: Patient Chief Complaint Right heel ulcer Electronic Signature(s) Signed: 05/07/2023 8:55:25 AM By: Allen Derry PA-C Entered By: Allen Derry on 05/07/2023 08:55:25 -------------------------------------------------------------------------------- Problem List Details Patient Name: Date of Service: [name], [name] 05/07/2023 9:00 A M Medical Record Number: [id number] Patient Account Number: [id number] Date of Birth/Sex: Treating RN: 1966-02-25 (57 y.o. F) Primary Care Provider: [name] Other Clinician: Referring Provider: Treating Provider/Extender: [name] in Treatment: 21 Active Problems ICD-10 Encounter Code Description Active Date MDM Diagnosis L97.412 Non-pressure chronic ulcer of right heel and midfoot with fat 06/12/2022 No Yes layer exposed M86.671 Other chronic osteomyelitis, right ankle and foot 08/28/2022 No Yes E11.621 Type 2 diabetes mellitus with foot ulcer 06/12/2022 No Yes I89.0 Lymphedema, not elsewhere classified 06/12/2022 No Yes L97.812 Non-pressure chronic ulcer of other part of right lower leg with 03/05/2023 No Yes fat layer exposed Wuebker, Shany ([id number]) [id number] Page 2 of 2 Inactive Problems Resolved Problems Electronic Signature(s) Signed: 05/07/2023 8:55:19 AM By: Allen Derry PA-C Entered By: Allen Derry on 05/07/2023 08:55:19

## 2023-05-09 NOTE — Progress Notes (Signed)
[name], [name] ([id number]) [id number] Page 1 of 6 Visit Report for 05/07/2023 Arrival Information Details Patient Name: Date of Service: [name], [name] 05/07/2023 9:00 A M Medical Record Number: [id number] Patient Account Number: [id number] Date of Birth/Sex: Treating RN: 11/24/1965 (57 y.o. F) Primary Care [name]: [name] Other Clinician: Referring [name]: Treating [name]/Extender: [name] in Treatment: 29 Visit Information History Since Last Visit Added or deleted any medications: No Patient Arrived: Ambulatory Any new allergies or adverse reactions: No Arrival Time: 09:01 Had a fall or experienced change in No Accompanied By: son activities of daily living that may affect Transfer Assistance: None risk of falls: Patient Identification Verified: Yes Signs or symptoms of abuse/neglect since last No Secondary Verification Process Completed: Yes visito Patient Requires Transmission-Based Precautions: No Hospitalized since last visit: No Patient Has Alerts: Yes Implantable device outside of the clinic No Patient Alerts: PICC in left arm excluding cellular tissue based products placed in the center since last visit: Has Dressing in Place as Prescribed: Yes Has Footwear/Offloading in Place as Yes Prescribed: Right: Removable Cast Walker/Walking Boot T Contact Cast otal Pain Present Now: Yes Electronic Signature(s) Signed: 05/09/2023 11:53:17 AM By: [name] Entered By: [name] on 05/07/2023 09:02:19 -------------------------------------------------------------------------------- Encounter Discharge Information Details Patient Name: Date of Service: [name], [name] 05/07/2023 9:00 A M Medical Record Number: [id number] Patient Account Number: [id number] Date of Birth/Sex: Treating RN: 26-Mar-1966 (57 y.o. [name] Primary Care [name]: [name] Other Clinician: Referring [name]: Treating  [name]/Extender: [name] in Treatment: 76 Encounter Discharge Information Items Post Procedure Vitals Discharge Condition: Stable Temperature (F): 97.9 Ambulatory Status: Ambulatory Pulse (bpm): 98 Discharge Destination: Home Respiratory Rate (breaths/min): 18 Transportation: Private Auto Blood Pressure (mmHg): 114/78 Accompanied By: son Schedule Follow-up Appointment: Yes Clinical Summary of Care: Electronic Signature(s) Signed: 05/07/2023 5:15:20 PM By: [name] Stall RN, BSN Entered By: [name] on 05/07/2023 09:56:36 [name], [name] ([id number]) [id number] Page 2 of 6 -------------------------------------------------------------------------------- Lower Extremity Assessment Details Patient Name: Date of Service: [name], [name] 05/07/2023 9:00 A M Medical Record Number: [id number] Patient Account Number: [id number] Date of Birth/Sex: Treating RN: June 25, 1966 (57 y.o. F) Primary Care [name]: [name] Other Clinician: Referring [name]: Treating [name]/Extender: [name] in Treatment: 47 Edema Assessment Assessed: [Left: No] [Right: No] Edema: [Left: Ye] [Right: s] Calf Left: Right: Point of Measurement: 32 cm From Medial Instep 44.5 cm Ankle Left: Right: Point of Measurement: 10 cm From Medial Instep 31.2 cm Vascular Assessment Extremity colors, hair growth, and conditions: Extremity Color: [Right:Normal] Hair Growth on Extremity: [Right:No] Temperature of Extremity: [Right:Warm] Capillary Refill: [Right:< 3 seconds] Dependent Rubor: [Right:No No] Toe Nail Assessment Left: Right: Thick: Yes Discolored: No Deformed: No Improper Length and Hygiene: Yes Electronic Signature(s) Signed: 05/09/2023 11:53:17 AM By: [name] Entered By: [name] on 05/07/2023 09:10:38 -------------------------------------------------------------------------------- Multi-Disciplinary Care  Plan Details Patient Name: Date of Service: [name], [name] 05/07/2023 9:00 A M Medical Record Number: [id number] Patient Account Number: [id number] Date of Birth/Sex: Treating RN: [birth year] (57 y.o. [name] Primary Care Gabryel Talamo: [name] Other Clinician: Referring [name]: Treating [name]/Extender: [name] in Treatment: [name] [name]/Skin Impairment [name], [name] ([id number]) [id number] Page 3 of 6 Nursing Diagnoses: Impaired tissue integrity Knowledge deficit related to ulceration/compromised skin integrity Goals: Patient will have a decrease in [name] volume by X% from date: (specify in notes) Date Initiated: 06/12/2022 Target Resolution Date: 06/07/2023 Goal Status: Active Patient/caregiver will verbalize understanding  of skin care regimen Date Initiated: 06/12/2022 Target Resolution Date: 06/07/2023 Goal Status: Active Ulcer/skin breakdown will have a volume reduction of 30% by week 4 Date Initiated: 06/12/2022 Date Inactivated: 07/31/2022 Target Resolution Date: 08/03/2022 Goal Status: Unmet Unmet Reason: dx with osteomyelitis. Ulcer/skin breakdown will have a volume reduction of 50% by week 8 Date Initiated: 06/12/2022 Date Inactivated: 10/16/2022 Target Resolution Date: 10/05/2022 Unmet Reason: see [name] Goal Status: Unmet measurement. IV antibiotics Interventions: Assess patient/caregiver ability to obtain necessary supplies Assess patient/caregiver ability to perform ulcer/skin care regimen upon admission and as needed Assess ulceration(s) every visit Notes: Electronic Signature(s) Signed: 05/07/2023 5:15:20 PM By: [name] Stall RN, BSN Entered By: [name] on 05/07/2023 09:55:23 -------------------------------------------------------------------------------- Pain Assessment Details Patient Name: Date of Service: [name], [name] 05/07/2023 9:00 A M Medical Record Number: [id number] Patient  Account Number: [id number] Date of Birth/Sex: Treating RN: 08/14/1966 (57 y.o. F) Primary Care Azahel Belcastro: [name] Other Clinician: Referring [name]: Treating [name]/Extender: [name] in Treatment: 49 Active Problems Location of Pain Severity and Description of Pain Patient Has Paino Yes Site Locations Pain Location: Pain in Ulcers Rate the pain. Current Pain Level: 1 Character of Pain Describe the Pain: Aching Pain Management and Medication Swiatek, Jannell ([id number]) [id number] Page 4 of 6 Current Pain Management: Electronic Signature(s) Signed: 05/09/2023 11:53:17 AM By: [name] Entered By: [name] on 05/07/2023 09:02:54 -------------------------------------------------------------------------------- Patient/Caregiver Education Details Patient Name: Date of Service: [name], [name] 7/31/2024andnbsp9:00 A M Medical Record Number: [id number] Patient Account Number: [id number] Date of Birth/Gender: Treating RN: 03/20/1966 (57 y.o. [name] Primary Care Physician: [name] Other Clinician: Referring Physician: Treating Physician/Extender: [name] in Treatment: 28 Education Assessment Education Provided To: Patient Education Topics Provided [name]/Skin Impairment: Handouts: Caring for Your Ulcer Methods: Explain/Verbal Responses: Reinforcements needed Electronic Signature(s) Signed: 05/07/2023 5:15:20 PM By: [name] Stall RN, BSN Entered By: [name] on 05/07/2023 09:55:36 -------------------------------------------------------------------------------- [name] Assessment Details Patient Name: Date of Service: [name], [name] 05/07/2023 9:00 A M Medical Record Number: [id number] Patient Account Number: [id number] Date of Birth/Sex: Treating RN: 02-Oct-1966 (57 y.o. F) Primary Care Jaeden Messer: [name] Other Clinician: Referring Lindyn Vossler: Treating Makenly Larabee/Extender:  [name] in Treatment: 74 [name] Status [name] Number: 1 Primary Etiology: Diabetic [name]/Ulcer of the Lower Extremity [name] Location: Right Calcaneus [name] Status: Open Wounding Event: Gradually Appeared Comorbid History: Hypertension, Type II Diabetes, Osteomyelitis Date Acquired: 11/07/2020 Weeks Of Treatment: 47 Clustered [name]: Yes Photos [name], [name] ([id number]) [id number] Page 5 of 6 [name] Measurements Length: (cm) Width: (cm) Depth: (cm) Clustered Quantity: Area: (cm) Volume: (cm) 0.3 % Reduction in Area: 99.2% 0.4 % Reduction in Volume: 99.2% 0.2 Epithelialization: Medium (34-66%) 1 Tunneling: No 0.094 Undermining: No 0.019 [name] Description Classification: Grade 3 [name] Margin: Thickened Exudate Amount: Medium Exudate Type: Serosanguineous Exudate Color: red, brown Foul Odor After Cleansing: No Slough/Fibrino No [name] Bed Granulation Amount: Large (67-100%) Exposed Structure Granulation Quality: Red, Pink Fascia Exposed: No Necrotic Amount: Small (1-33%) Fat Layer (Subcutaneous Tissue) Exposed: Yes Necrotic Quality: Adherent Slough Tendon Exposed: No Muscle Exposed: No Joint Exposed: No Bone Exposed: No Periwound Skin Texture Texture Color No Abnormalities Noted: No No Abnormalities Noted: No Callus: Yes Atrophie Blanche: No Crepitus: No Cyanosis: No Excoriation: No Ecchymosis: No Induration: No Erythema: No Rash: No Hemosiderin Staining: No Scarring: No Mottled: No Pallor: No Moisture Rubor: No No Abnormalities Noted: No Dry / Scaly: No Temperature / Pain Maceration: No Temperature: No Abnormality Tenderness on Palpation:  Yes Treatment Notes [name] #1 (Calcaneus) [name] Laterality: Right Cleanser Soap and Water Discharge Instruction: May shower and wash [name] with dial antibacterial soap and water prior to dressing change. [name] Cleanser Discharge Instruction: Cleanse the [name] with  [name] cleanser prior to applying a clean dressing using gauze sponges, not tissue or cotton balls. Peri-[name] Care Sween Lotion (Moisturizing lotion) Discharge Instruction: Apply moisturizing lotion as directed Topical Primary Dressing Promogran Prisma Matrix, 4.34 (sq in) (silver collagen) Discharge Instruction: Moisten collagen with saline or hydrogel Secondary Dressing [name], [name] ([id number]) [id number] Page 6 of 6 Woven Gauze Sponge, Non-Sterile 4x4 in Discharge Instruction: Apply over primary dressing as directed. Zetuvit Plus 4x8 in Discharge Instruction: Apply over primary dressing as directed. Secured With [employer], 4.5x3.1 (in/yd) Discharge Instruction: Secure with Kerlix as directed. 34M Medipore H Soft Cloth Surgical T ape, 4 x 10 (in/yd) Discharge Instruction: Secure with tape as directed. Compression Wrap Compression Stockings Add-Ons Electronic Signature(s) Signed: 05/09/2023 11:53:17 AM By: [name] Entered By: [name] on 05/07/2023 09:09:53 -------------------------------------------------------------------------------- Vitals Details Patient Name: Date of Service: [name], [name] 05/07/2023 9:00 A M Medical Record Number: [id number] Patient Account Number: [id number] Date of Birth/Sex: Treating RN: Mar 11, 1966 (57 y.o. F) Primary Care Linford Quintela: [name] Other Clinician: Referring Zaley Talley: Treating Javaya [place]/Extender: [name] in Treatment: 58 Vital Signs Time Taken: 09:02 Temperature (F): 97.9 Weight (lbs): 331 Pulse (bpm): 98 Respiratory Rate (breaths/min): 18 Blood Pressure (mmHg): 114/78 Reference Range: 80 - 120 mg / dl Electronic Signature(s) Signed: 05/09/2023 11:53:17 AM By: [name] Entered By: [name] on 05/07/2023 09:02:42

## 2023-05-14 ENCOUNTER — Ambulatory Visit (HOSPITAL_BASED_OUTPATIENT_CLINIC_OR_DEPARTMENT_OTHER): Payer: No Typology Code available for payment source | Admitting: Physician Assistant

## 2023-05-21 ENCOUNTER — Encounter (HOSPITAL_BASED_OUTPATIENT_CLINIC_OR_DEPARTMENT_OTHER): Payer: No Typology Code available for payment source | Attending: Physician Assistant | Admitting: Physician Assistant

## 2023-05-21 DIAGNOSIS — E1169 Type 2 diabetes mellitus with other specified complication: Secondary | ICD-10-CM | POA: Insufficient documentation

## 2023-05-21 DIAGNOSIS — L97812 Non-pressure chronic ulcer of other part of right lower leg with fat layer exposed: Secondary | ICD-10-CM | POA: Insufficient documentation

## 2023-05-21 DIAGNOSIS — I890 Lymphedema, not elsewhere classified: Secondary | ICD-10-CM | POA: Diagnosis not present

## 2023-05-21 DIAGNOSIS — E11621 Type 2 diabetes mellitus with foot ulcer: Secondary | ICD-10-CM | POA: Diagnosis present

## 2023-05-21 DIAGNOSIS — L97412 Non-pressure chronic ulcer of right heel and midfoot with fat layer exposed: Secondary | ICD-10-CM | POA: Insufficient documentation

## 2023-05-21 DIAGNOSIS — E11622 Type 2 diabetes mellitus with other skin ulcer: Secondary | ICD-10-CM | POA: Diagnosis not present

## 2023-05-21 DIAGNOSIS — M86671 Other chronic osteomyelitis, right ankle and foot: Secondary | ICD-10-CM | POA: Diagnosis not present

## 2023-05-21 DIAGNOSIS — Z89421 Acquired absence of other right toe(s): Secondary | ICD-10-CM | POA: Diagnosis not present

## 2023-05-21 NOTE — Progress Notes (Signed)
[name], [name] ([id number]) [id number] Page 1 of 6 Visit Report for 05/21/2023 Arrival Information Details Patient Name: Date of Service: [name] 05/21/2023 9:00 A M Medical Record Number: [id number] Patient Account Number: [id number] Date of Birth/Sex: Treating RN: [birth year] (57 y.o. [name] Primary Care Aysen Shieh: [name] Other Clinician: Referring Kerisha Goughnour: Treating Venetta Knee/Extender: [name] in Treatment: 64 Visit Information History Since Last Visit Added or deleted any medications: No Patient Arrived: Ambulatory Any new allergies or adverse reactions: No Arrival Time: 09:06 Had a fall or experienced change in No Accompanied By: son activities of daily living that may affect Transfer Assistance: None risk of falls: Patient Requires Transmission-Based Precautions: No Signs or symptoms of abuse/neglect since last visito No Patient Has Alerts: Yes Hospitalized since last visit: No Patient Alerts: PICC in left arm Implantable device outside of the clinic excluding No cellular tissue based products placed in the center since last visit: Has Dressing in Place as Prescribed: Yes Pain Present Now: No Electronic Signature(s) Signed: 05/21/2023 4:14:55 PM By: Karie Schwalbe RN Entered By: Karie Schwalbe on 05/21/2023 09:06:52 -------------------------------------------------------------------------------- Encounter Discharge Information Details Patient Name: Date of Service: [name] 05/21/2023 9:00 A M Medical Record Number: [id number] Patient Account Number: [id number] Date of Birth/Sex: Treating RN: 03/31/1966 (57 y.o. [name] Primary Care Tehilla Coffel: [name] Other Clinician: Referring Cyndal Kasson: Treating Alric Geise/Extender: [name] in Treatment: 44 Encounter Discharge Information Items Post Procedure Vitals Discharge Condition: Stable Temperature (F):  98.3 Ambulatory Status: Ambulatory Pulse (bpm): 91 Discharge Destination: Home Respiratory Rate (breaths/min): 18 Transportation: Private Auto Blood Pressure (mmHg): 117/83 Accompanied By: son Schedule Follow-up Appointment: Yes Clinical Summary of Care: Patient Declined Electronic Signature(s) Signed: 05/21/2023 4:14:55 PM By: Karie Schwalbe RN Entered By: Karie Schwalbe on 05/21/2023 10:26:41 [name], [name] ([id number]) [id number] Page 2 of 6 -------------------------------------------------------------------------------- Lower Extremity Assessment Details Patient Name: Date of Service: [name] 05/21/2023 9:00 A M Medical Record Number: [id number] Patient Account Number: [id number] Date of Birth/Sex: Treating RN: [birth year] (57 y.o. [name] Primary Care Okley Magnussen: [name] Other Clinician: Referring Francene Mcerlean: Treating Bryant Lipps/Extender: [name] in Treatment: 49 Edema Assessment Assessed: [Left: No] [Right: No] Edema: [Left: Ye] [Right: s] Calf Left: Right: Point of Measurement: 32 cm From Medial Instep 40 cm Ankle Left: Right: Point of Measurement: 10 cm From Medial Instep 31.2 cm Vascular Assessment Pulses: Dorsalis Pedis Palpable: [Right:Yes] Extremity colors, hair growth, and conditions: Extremity Color: [Right:Normal] Hair Growth on Extremity: [Right:No] Temperature of Extremity: [Right:Warm] Capillary Refill: [Right:< 3 seconds] Dependent Rubor: [Right:No No] Toe Nail Assessment Left: Right: Thick: Yes Discolored: No Deformed: No Improper Length and Hygiene: No Electronic Signature(s) Signed: 05/21/2023 4:14:55 PM By: Karie Schwalbe RN Entered By: Karie Schwalbe on 05/21/2023 09:13:28 -------------------------------------------------------------------------------- Multi-Disciplinary Care Plan Details Patient Name: Date of Service: [name] 05/21/2023 9:00 A M Medical Record  Number: [id number] Patient Account Number: [id number] Date of Birth/Sex: Treating RN: 05-03-1966 (57 y.o. [name] Primary Care Larin Weissberg: [name] Other Clinician: Referring Arian Murley: Treating Mayari Matus/Extender: [name] in Treatment: [address] [place] ([id number]) [id number] Page 3 of 6 Wound/Skin Impairment Nursing Diagnoses: Impaired tissue integrity Knowledge deficit related to ulceration/compromised skin integrity Goals: Patient will have a decrease in wound volume by X% from date: (specify in notes) Date Initiated: 06/12/2022 Target Resolution Date: 08/07/2023 Goal Status: Active Patient/caregiver will verbalize understanding of skin care regimen Date Initiated: 06/12/2022 Target Resolution Date: 08/07/2023 Goal Status:  Active Ulcer/skin breakdown will have a volume reduction of 30% by week 4 Date Initiated: 06/12/2022 Date Inactivated: 07/31/2022 Target Resolution Date: 08/03/2022 Goal Status: Unmet Unmet Reason: dx with osteomyelitis. Ulcer/skin breakdown will have a volume reduction of 50% by week 8 Date Initiated: 06/12/2022 Date Inactivated: 10/16/2022 Target Resolution Date: 10/05/2022 Unmet Reason: see wound Goal Status: Unmet measurement. IV antibiotics Interventions: Assess patient/caregiver ability to obtain necessary supplies Assess patient/caregiver ability to perform ulcer/skin care regimen upon admission and as needed Assess ulceration(s) every visit Notes: Electronic Signature(s) Signed: 05/21/2023 4:14:55 PM By: Karie Schwalbe RN Entered By: Karie Schwalbe on 05/21/2023 09:54:50 -------------------------------------------------------------------------------- Pain Assessment Details Patient Name: Date of Service: [name], [name] 05/21/2023 9:00 A M Medical Record Number: [id number] Patient Account Number: [id number] Date of Birth/Sex: Treating RN: 08-14-1966 (57 y.o. [name] Primary Care Khairi Garman: [name] Other Clinician: Referring Zorah Backes: Treating Saladin Petrelli/Extender: [name] in Treatment: 88 Active Problems Location of Pain Severity and Description of Pain Patient Has Paino No Site Locations Pain Management and Medication Current Pain Management: [name], [name] ([id number]) [id number] Page 4 of 6 Electronic Signature(s) Signed: 05/21/2023 4:14:55 PM By: Karie Schwalbe RN Entered By: Karie Schwalbe on 05/21/2023 09:11:21 -------------------------------------------------------------------------------- Patient/Caregiver Education Details Patient Name: Date of Service: [name], [name] 8/14/2024andnbsp9:00 A M Medical Record Number: [id number] Patient Account Number: [id number] Date of Birth/Gender: Treating RN: July 26, 1966 (57 y.o. [name] Primary Care Physician: [name] Other Clinician: Referring Physician: Treating Physician/Extender: [name] in Treatment: 59 Education Assessment Education Provided To: Patient Education Topics Provided Wound/Skin Impairment: Methods: Explain/Verbal Responses: Return demonstration correctly Electronic Signature(s) Signed: 05/21/2023 4:14:55 PM By: Karie Schwalbe RN Entered By: Karie Schwalbe on 05/21/2023 09:55:08 -------------------------------------------------------------------------------- Wound Assessment Details Patient Name: Date of Service: [name], [name] 05/21/2023 9:00 A M Medical Record Number: [id number] Patient Account Number: [id number] Date of Birth/Sex: Treating RN: 05/20/1966 (57 y.o. [name] Primary Care Majestic Brister: [name] Other Clinician: Referring Jazyah Butsch: Treating Ruchama Kubicek/Extender: [name] in Treatment: 69 Wound Status Wound Number: 1 Primary Etiology: Diabetic Wound/Ulcer of the Lower Extremity Wound Location: Right Calcaneus Wound  Status: Open Wounding Event: Gradually Appeared Comorbid History: Hypertension, Type II Diabetes, Osteomyelitis Date Acquired: 11/07/2020 Weeks Of Treatment: 49 Clustered Wound: Yes Photos [name], [name] ([id number]) [id number] Page 5 of 6 Wound Measurements Length: (cm) Width: (cm) Depth: (cm) Clustered Quantity: Area: (cm) Volume: (cm) 0.3 % Reduction in Area: 99.2% 0.4 % Reduction in Volume: 98.8% 0.3 Epithelialization: Medium (34-66%) 1 Tunneling: No 0.094 Undermining: No 0.028 Wound Description Classification: Grade 3 Wound Margin: Thickened Exudate Amount: Medium Exudate Type: Serosanguineous Exudate Color: red, brown Foul Odor After Cleansing: No Slough/Fibrino No Wound Bed Granulation Amount: Small (1-33%) Exposed Structure Granulation Quality: Red, Pink Fascia Exposed: No Necrotic Amount: Large (67-100%) Fat Layer (Subcutaneous Tissue) Exposed: Yes Necrotic Quality: Eschar, Adherent Slough Tendon Exposed: No Muscle Exposed: No Joint Exposed: No Bone Exposed: No Periwound Skin Texture Texture Color No Abnormalities Noted: No No Abnormalities Noted: No Callus: Yes Atrophie Blanche: No Crepitus: No Cyanosis: No Excoriation: No Ecchymosis: No Induration: No Erythema: No Rash: No Hemosiderin Staining: No Scarring: No Mottled: No Pallor: No Moisture Rubor: No No Abnormalities Noted: No Dry / Scaly: No Temperature / Pain Maceration: No Temperature: No Abnormality Tenderness on Palpation: Yes Treatment Notes Wound #1 (Calcaneus) Wound Laterality: Right Cleanser Soap and Water Discharge Instruction: May shower and wash wound with dial antibacterial soap and water prior to dressing change. Wound Cleanser  Discharge Instruction: Cleanse the wound with wound cleanser prior to applying a clean dressing using gauze sponges, not tissue or cotton balls. Peri-Wound Care Sween Lotion (Moisturizing lotion) Discharge Instruction:  Apply moisturizing lotion as directed Topical Primary Dressing Promogran Prisma Matrix, 4.34 (sq in) (silver collagen) Discharge Instruction: Moisten collagen with saline or hydrogel TCC Discharge Instruction: #4 this week 05/21/23 [name], [name] ([id number]) [id number] Page 6 of 6 Secondary Dressing Woven Gauze Sponge, Non-Sterile 4x4 in Discharge Instruction: Apply over primary dressing as directed. Zetuvit Plus 4x8 in Discharge Instruction: Apply over primary dressing as directed. Secured With [employer], 4.5x3.1 (in/yd) Discharge Instruction: Secure with Kerlix as directed. 29M Medipore H Soft Cloth Surgical T ape, 4 x 10 (in/yd) Discharge Instruction: Secure with tape as directed. Compression Wrap Compression Stockings Add-Ons Electronic Signature(s) Signed: 05/21/2023 4:14:55 PM By: Karie Schwalbe RN Entered By: Karie Schwalbe on 05/21/2023 09:16:54 -------------------------------------------------------------------------------- Vitals Details Patient Name: Date of Service: [name], [name] 05/21/2023 9:00 A M Medical Record Number: [id number] Patient Account Number: [id number] Date of Birth/Sex: Treating RN: 09-Oct-1965 (57 y.o. [name] Primary Care Takota Cahalan: [name] Other Clinician: Referring Vetta Couzens: Treating Aslynn Brunetti/Extender: [name] in Treatment: 49 Vital Signs Time Taken: 09:11 Temperature (F): 98.1 Weight (lbs): 331 Pulse (bpm): 91 Respiratory Rate (breaths/min): 18 Blood Pressure (mmHg): 117/83 Reference Range: 80 - 120 mg / dl Electronic Signature(s) Signed: 05/21/2023 4:14:55 PM By: Karie Schwalbe RN Entered By: Karie Schwalbe on 05/21/2023 10:25:58

## 2023-05-22 NOTE — Progress Notes (Addendum)
[name], [name] ([id number]) [id number] Page 1 of 11 Visit Report for 05/21/2023 Chief Complaint Document Details Patient Name: Date of Service: [name], [name] 05/21/2023 9:00 A M Medical Record Number: [id number] Patient Account Number: [id number] Date of Birth/Sex: Treating RN: [birth year] (57 y.o. F) Primary Care Provider: [name] Other Clinician: Referring Provider: Treating Provider/Extender: [name] in Treatment: 79 Information Obtained from: Patient Chief Complaint Right heel ulcer Electronic Signature(s) Signed: 05/21/2023 9:57:53 AM By: Allen Derry PA-C Entered By: Allen Derry on 05/21/2023 06:57:53 -------------------------------------------------------------------------------- Debridement Details Patient Name: Date of Service: [name], [name] 05/21/2023 9:00 A M Medical Record Number: [id number] Patient Account Number: [id number] Date of Birth/Sex: Treating RN: Jan 15, 1966 (57 y.o. [name] Primary Care Provider: [name] Other Clinician: Referring Provider: Treating Provider/Extender: [name] in Treatment: 49 Debridement Performed for Assessment: Wound #1 Right Calcaneus Performed By: Physician Lenda Kelp, PA Debridement Type: Debridement Severity of Tissue Pre Debridement: Fat layer exposed Level of Consciousness (Pre-procedure): Awake and Alert Pre-procedure Verification/Time Out Yes - 09:20 Taken: Start Time: 09:20 Pain Control: Lidocaine 5% topical ointment Percent of Wound Bed Debrided: 100% T Area Debrided (cm): otal 0.09 Tissue and other material debrided: Viable, Non-Viable, Callus, Slough, Subcutaneous, Slough Level: Skin/Subcutaneous Tissue Debridement Description: Excisional Instrument: Curette Bleeding: Minimum Hemostasis Achieved: Pressure End Time: 09:21 Procedural Pain: 0 Post Procedural Pain: 0 Response to Treatment: Procedure was tolerated  well Level of Consciousness (Post- Awake and Alert procedure): Post Debridement Measurements of Total Wound Length: (cm) 0.3 Width: (cm) 0.4 Depth: (cm) 0.3 Volume: (cm) 0.028 Character of Wound/Ulcer Post Debridement: Improved [name], [name] ([id number]) [id number] Page 2 of 11 Severity of Tissue Post Debridement: Fat layer exposed Post Procedure Diagnosis Same as Pre-procedure Notes [name] STONE III PA-C, by J.Scotton RN Electronic Signature(s) Signed: 05/21/2023 4:14:55 PM By: Karie Schwalbe RN Signed: 05/21/2023 5:04:24 PM By: Allen Derry PA-C Entered By: Karie Schwalbe on 05/21/2023 06:56:12 -------------------------------------------------------------------------------- HPI Details Patient Name: Date of Service: [name], [name] 05/21/2023 9:00 A M Medical Record Number: [id number] Patient Account Number: [id number] Date of Birth/Sex: Treating RN: 15-Apr-1966 (57 y.o. F) Primary Care Provider: [name] Other Clinician: Referring Provider: Treating Provider/Extender: [name] in Treatment: 33 History of Present Illness HPI Description: 06-13-2022 upon evaluation today patient presents for evaluation of her right heel ulcer. She is having a tremendous amount of pain at this location. She tells me that her most recent hemoglobin A1c was 8.7 and that was on 08-23-2021. This ulcer on the heel has been present she states since February 2023 when she had an injection to the heel by podiatry. She states that she began to have increasing pain the wound opened and has been open ever since. 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 She had an x-ray in June if anything can although there did not appear to be any obvious evidence of osteomyelitis at that point. She subsequently has had OR debridement several times of the wound performed by podiatry. Patient does have a history of lymphedema of the lower  extremities she is also a type II diabetic. 06-19-2022 upon evaluation today patient appears to be doing better in regard to the size of her leg which is significantly improved. With that being said she had a lot of drainage from the heel which is completely understandable considering what is going on at this time. There does not appear to be any evidence of active infection there is no  warmth or irritation to the leg in general. With that being said the patient does have an issue here with the amount of drainage that is going on I definitely think Zetuvit is good to be the better way to go in place of ABD pads. Also think that she could potentially benefit from 3 times per week dressing changes but again right now we are going to stick with the to change it today, change in her Friday, and then subsequently seeing where things stand next Wednesday. The other option would be to change her to a different day for wound care having her come then say like a Tuesday Friday or Monday Thursday. 06-26-2022 upon evaluation patient's wound bed actually showed signs of doing well in regard to the overall size it was measuring slightly smaller. With that being said unfortunately the biggest issue we see is she still has a tremendous amount of drainage which is what could keep this from being able to use a total contact cast. For that reason I am did going to discuss with her today the possibility of trying Tubigrip to see if that could be of benefit for her. She voiced understanding and is in agreement with giving this a try. 07-03-2022 upon evaluation today patient unfortunately appears to be doing significantly worse at this point in regard to her swelling due to the fact that she was unable to really wear the Tubigrip. She tells me that it was cutting into her. With that being said she also did have an MRI this MRI revealed that she does have osteomyelitis in the heel this was actually just performed Monday, 25  September. This does show signs of "early osteomyelitis". Nonetheless this is still concerning to me. The wound also appears to be getting deeper in the center aspect of this which has me a little concerned as well. I do believe that she is likely going require an aggressive approach here to try to get things better. I discussed that in greater detail with her today. I will detailed in the plan. 07-17-2022 upon evaluation today patient appears to be doing poorly in regard to her wound. Unfortunately she does not show any signs of infection systemically though locally this seems to be doing worse with the wound actually being bigger than where it was previous. She did have an MRI that showed evidence of osteomyelitis actually recommended a referral to ID she was evaluated infectious disease and they recommended referral to podiatry and to be honest have recommended amputation based on what the patient tells me today. With that being said this is something that she is not interested in at all to be perfectly honest. For that reason she wants to know what she should do and where she should go at this point that is where the majority of the conversation which was quite lengthy today went. Obviously understand her concern here but I think she is going to have to definitely get off this and likely this means she is going to need to come out of work. 10/18; Since the patient was the patient is using Hydrofera Blue on her right heel wound. She has a new larger open area superior to the wound. The skin on the bottom of her foot is completely macerated. I reviewed the infectious disease note on this patient from 10/9. They recommended keeping the patient on Delofloxacin 450 twice daily until follow-up tomorrow. The patient states she is not going back there as the only thing they wanted  to do was "cut my leg off]. She has not seen podiatry. Her lab works shows a CRP markedly elevated at 240.2 a sedimentation  rate of 96 the rest of her CBC is normal creatinine of 0.38 The patient has been to see her primary doctor who is Dr. Leonides Schanz [SPo] In [place]. According the patient Dr. Loralee Pacas has ordered vancomycin and Zosyn to start on November 1. She is not taking the delofloxacin that was suggested by infectious disease. Very angry that they just wanted to consider her for an amputation although I do not specifically see that stated in their note 07-31-2022 upon evaluation today patient still has a significant wound over her plantar aspect of her right l foot region. With that being said I am definitely concerned about the fact that the patient probably does need to be in hyperbaric oxygen therapy at this point. I think we should try to get this going as quickly Persico, Jaselynn ([id number]) [phone].pdf Page 3 of 11 as possible. She actually is going to be set up with a PICC line next Wednesday and then following this we will have the vancomycin and Zosyn that she will be taking for 8 weeks according to what she tells me. I think this is definitely going to be beneficial and again the goal here is limb salvage. In combination with that I think the hyperbaric oxygen therapy would be ultimately significantly helpful for her. 08-07-2022 upon evaluation today patient appears to still be doing quite poorly overall she still has not gotten her antibiotics. She got her PICC line today which I assumed she will be getting the first dose of antibiotics as well during that time. With that being said unfortunately she did not get the antibiotics and in fact as I questioned her further she does not even know when or if that is going to be started this week. Again I am very concerned about this considering she already has a PICC line in place we do not want this to clot off on top of the fact that she should already be on these antibiotics she just never went to the ER for evaluation therefore they never got  this started. At this point based on what I am seeing I really think that the ideal thing would be for [place] to see about getting her to the ER for further evaluation and treatment to see if they can get this started for her as soon as possible. The patient voiced understanding and is in agreement with the plan. I gave her recommendations for what should be done also called [facility] infusion therapy in order to see if they would be the ones that seem to be available for her IV infusions but they tell me that they no longer do this that is apparently where the orders were sent to for her infusion therapy. That was by her primary care provider. 08-21-2022 upon evaluation today patient's wound on the bottom of the heel and foot area as well as the toe appear to be doing significantly better. Fortunately I do not see any evidence of infection locally or systemically and everything is measuring much smaller than where we have been. The wound in the left gluteal region also is dramatically improved compared to last time she was here. She has started the antibiotics this is cefepime and vancomycin. She did have a somewhat high trough and therefore they have her hold that today and they are to recheck in the morning I believe she told  me. Nonetheless with the antibiotics going she seems to be doing significantly better which is great news. 08-28-2022 upon evaluation today patient appears to be doing somewhat better in regard to her foot there is definitely less drainage than what we have seen in the past. I do believe the antibiotics are helping. With that being said she is also been approved for hyperbaric oxygen therapy and I think as soon as we get this started the better. I discussed that with her today as well. With that being said I do think that the last thing she needs is her chest x-ray when she has that done will be ready to start her into treatment. She is going to try to go get that today when she leaves  which I think would be ideal. 09-11-2022 upon evaluation today patient appears to be doing well with regard to her foot ulcer. This is showing signs of no digression and she does not appear to be having as much drainage as before though it still very wet is not nearly the extreme of what it was previous. Fortunately there does not appear to be any signs of active infection locally nor systemically at this point which is great news. Patient did have her chest x-ray as well as EKG and she is actually approved and completely cleared for HBO therapy from both a clinical standpoint as well as an insurance standpoint. 09-25-2022 upon evaluation today patient appears to be doing well in regard to her foot ulcer. Unfortunately she is not doing as well when it comes to her hyperbaric treatments. She is having a lot of trouble with claustrophobia. She tells me in general that she is not sure she is good to be able to continue. 10-09-2022 upon evaluation today patient's wounds actually are showing signs of excellent improvement which is great news. Fortunately I do not see any evidence of infection locally or systemically which is great news and overall I am extremely pleased with where things are currently. I do think that she is making good progress she does tell me at this point today she does not want to go forward with a hyperbaric oxygen therapy. 10-16-2022 upon evaluation today patient appears to be doing excellent in regard to her foot ulcer. This is actually showing signs of excellent improvement. She still does not allow for any sharp debridement but the good news is this is doing much better and does not even need it at the moment. Nonetheless as far as sharp debridement is concerned this has been too painful for really not been able to do that all along. 10-23-2022 upon evaluation today patient actually is making excellent progress. I am extremely pleased with where we stand I have considered even  seeing about putting her in the total contact cast but to be honest she is doing so well currently and still with having some drainage I feel like it is better for her to be able to change it then to be locked up in the cast for a week at a time. She has voiced understanding as well. Overall though I feel like you are making some really good progress here. 10-30-2022 upon evaluation today patient appears to be doing excellent in regard to her foot ulcers. Both are showing signs of improvement which is great news and overall I feel like that they are measuring smaller, looking better, and I see no signs of resurgence of the infection which is great news. Overall I am extremely pleased at this  point. 2/7; the patient has 2 wounds which are remanence of the large wound on the right foot. This was a diabetic wound with underlying osteomyelitis she has completed antibiotics. Today she has the open area on the distal calcaneus and an area on the right midfoot which is eschared. She has been using silver alginate and offloading this in a regular running shoe. 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 11-27-2022 upon evaluation today patient appears to be doing well currently in regard to her wound which is actually showing signs of significant improvement this is great news. Fortunately there does not appear to be any signs of active infection locally nor systemically which is also excellent. No fevers, chills, nausea, vomiting, or diarrhea. 12-25-2022 upon evaluation today patient appears to be doing well currently in regard to the distal portion of her foot which is actually in my opinion looking a little better but have a lot of callus she is going require some debridement here. With that being said the heel actually looks like it might be a little bit worse. There is a lot of callus I cannot tell exactly what is open or not we can remove  that and we will see what exactly were dealing with there. With that being said she also has an area on the right side of her leg which appears to be more of a pressure injury she tells me this is from sitting in her recliner she has gotten a pillow to help take care of this. Fortunately it is not technically open and draining at this point made it very well may be before it is also not done. 01-01-2023 upon evaluation today patient appears to be doing well currently in regard to her wounds I feel like the cefdinir has helped she seems to be improved compared to last week's evaluation. I do not see any signs of active infection locally nor systemically at this time which is great news. No fevers, chills, nausea, vomiting, or diarrhea. 01-08-2023 upon evaluation today patient's wounds on the foot appear to be doing decently well the leg is still about the same. With that being said unfortunately she does have a lot of erythema on the leg in particular which is unfortunate and definitely not what we are looking for. Again I do not think that the [facility] has really done well for her in that regard. 01-15-2023 upon evaluation today patient appears to be doing well currently in regard to her wound. Overall I think that things are measuring a little bit better but unfortunately the erythema is spreading. I did review her culture today. Unfortunately it does show that she does have MRSA noted and again I think that we need to do something to try to get this under control as quickly as possible. She has previously been on IV antibiotics and unfortunately that has not been sufficient to get this completely cleared it seems to have come back at this point and she really wants to avoid going back on IV antibiotics. We have been using oral antibiotics and various forms the most recent was actually a course of cefdinir. She is also previously been on clindamycin and even in the past she has had to doxycycline. With  that being said with all things considered I think we are still having a lot of trouble here keeping this infection under control. We previously attempted hyperbarics but the patient did not  tolerate this well. She therefore discontinued HBO therapy. 01-22-2023 upon evaluation today patient appears to be doing a little better in regard to the cellulitis on her right leg. I am much more pleased with what I am seeing today. With that being said unfortunately she is still having quite a bit of issues here with erythema though not as bad I think that were not completely clear. Unfortunately we were denied for the [facility] prescription. She has the medication but I provided for her by way of samples but nothing further. 01-29-2023 upon evaluation today patient appears to be doing poorly currently in regard to her leg. I do not feel like she is showing as much improvement as she was within desirable now that we switch her to linezolid. With that being said I do believe that we probably need to see about IV antibiotics I recommended a referral back to ID. With that being said she would prefer to see her physician which I am okay with but either way I think she needs to be back on IV antibiotics for a minimum of 2 months as soon as possible. I am going to send her for lab work today in order to get some baseline labs but honestly I think that we are going to require IV antibiotics to get this under control. 02-12-2023 upon evaluation today patient appears to be doing well currently in regard to her wound all things considered. She has not gotten the lab work done yet [name], [name] ([id number]) [id number] Page 4 of 11 that we ordered a couple weeks back. She tells me she has had a lot going on with the death of the family. Fortunately there does not appear to be any signs of worsening in fact some of the heel looks a little bit better which is great news. Fortunately I do not see any  evidence of active infection locally nor systemically which is great news. 02-19-24 upon evaluation today patient appears to be doing well currently in regard to her wounds. She has been tolerating the dressing changes without complication. Fortunately there does not appear to be any signs of active infection locally nor systemically which is great news. No fevers, chills, nausea, vomiting, or diarrhea. She is currently on vancomycin and Zosyn through IV which is doing I think well she has been on it for not quite a week at this point. 03-05-2023 upon evaluation today patient appears to be doing well currently in regard to her wound. She has been tolerating the dressing changes all 3 locations actually are significantly smaller even compared to last week I am very pleased with where we stand. I think the antibiotics are really doing a good job here for her. 03-12-2023 upon evaluation today patient has actually continued to make excellent progress in regard to her wounds. I am very pleased with where we stand in fact I think her leg is healed the middle part of her foot I think is close to being closed and the heel does look better though there is some callus that needs to be removed at this point. I did discuss that with her today. 03-19-2023 upon evaluation today patient's wounds are actually showing signs of excellent improvement. I am actually very pleased with where we stand and I think that she is making really good progress here. I do not see any signs of active infection locally nor systemically which is great news. 03-26-2023 upon evaluation today patient's wounds actually are showing signs of improvement in fact  the middle of her foot is pretty much appearing to be completely healed which is great news. This means we are just dealing with the heel itself on the bottom of the foot as far as anything remaining is concerned at this point. I am extremely pleased with where we stand at this  time. 04-09-2023 upon evaluation today patient appears to be doing well currently in regard to the periwound. She has been tolerating the dressing changes without complication. Fortunately there does not appear to be any signs of active infection locally or systemically at this time. Potentially seeing about getting a total contact cast on and try to get this closed as quickly as possible. I think the casting is probably our best bet at this point and the patient is in agreement with that plan. At this point based on what I am seeing I do believe that the patient would benefit from a continuation of care with regard to dressing changes regularly. 04-16-2023 upon evaluation today patient appears to be doing well currently in regard to her wound which is actually showing signs of good improvement. Fortunately I do not see any evidence of active infection locally or systemically which is great news and in general I do believe that we are making good headway towards complete closure. I do think she is a candidate however for total contact cast which I think will speed up the closure process. 7/12; patient presents for follow-up. She is here for our obligatory cast change. She had the cast first placed two days ago and Hydrofera Blue was under the cast. She has no issues or complaints today. 04-23-2023 upon evaluation today patient appears to be doing well currently in regard to her wound. She is actually showing signs of excellent improvement and in general I feel like that we are making great headway towards closure. Her wound is significantly improved compared to last week when I saw her the cast is doing great despite the fact got so loose that today the cast actually slipped off without [place] even having to cut it with a cast saw. Obviously she is lost quite a bit of fluid in fact we did for inch cast last time she is definitely going to be in the 3 today she has lost a tremendous amount of fluid in this leg  which is actually really good news. I do not see any signs of active infection locally nor systemically at this point. 7/24; patient came in today with a size 3 total contact cast quite loose in fact it came off without being manually cut off. This is no doubt due to reduction in the amount of edema in her right lower leg. This should not be an ongoing issue. The wound is on the plantar right heel it was originally due to a injection she had for plantar fasciitis according to the patient. Her wound has been doing well 05-07-2023 upon evaluation today patient appears to be doing well currently in regard to her wound. She has been tolerating the dressing changes without complication. Fortunately I do not see any signs of active infection locally nor systemically which is great news. 05-21-2023 upon evaluation today patient appears to be doing well currently in regard to her wound on the heel. She has been tolerating the dressing changes without complication. Fortunately this is definitely getting smaller and looking much better and very pleased at this point. I do not see any signs of active infection locally nor systemically at this time. Electronic  Signature(s) Signed: 05/21/2023 9:58:29 AM By: Allen Derry PA-C Entered By: Allen Derry on 05/21/2023 06:58:29 -------------------------------------------------------------------------------- Physical Exam Details Patient Name: Date of Service: [name], [name] 05/21/2023 9:00 A M Medical Record Number: [id number] Patient Account Number: [id number] Date of Birth/Sex: Treating RN: 1966-08-27 (57 y.o. F) Primary Care Provider: [name] Other Clinician: Referring Provider: Treating Provider/Extender: [name] in Treatment: 44 Constitutional Well-nourished and well-hydrated in no acute distress. Respiratory normal breathing without difficulty. Psychiatric this patient is able to make decisions and demonstrates good insight  into disease process. Alert and Oriented x 3. pleasant and cooperative. [name], [name] ([id number]) [id number] Page 5 of 11 Notes Upon inspection patient's wound bed actually showed signs of good granulation and epithelization at this point. Fortunately I do not see any evidence of worsening overall and I do believe that the patient is making good headway towards complete closure which is great news. Electronic Signature(s) Signed: 05/21/2023 9:58:45 AM By: Allen Derry PA-C Entered By: Allen Derry on 05/21/2023 06:58:45 -------------------------------------------------------------------------------- Physician Orders Details Patient Name: Date of Service: [name], [name] 05/21/2023 9:00 A M Medical Record Number: [id number] Patient Account Number: [id number] Date of Birth/Sex: Treating RN: April 13, 1966 (57 y.o. [name] Primary Care Provider: [name] Other Clinician: Referring Provider: Treating Provider/Extender: [name] in Treatment: 21 Verbal / Phone Orders: No Diagnosis Coding Follow-up Appointments ppointment in 1 week. Allen Derry III PA-C Wednesday 05/28/23 at 8:45am(already scheduled) Return A ppointment in 2 weeks. [name] Wednesday 06/04/23 at 8:45am (already scheduled) Return A Anesthetic (In clinic) Topical Lidocaine 5% applied to wound bed Bathing/ Shower/ Hygiene May shower with protection but do not get wound dressing(s) wet. Protect dressing(s) with water repellant cover (for example, large plastic bag) or a cast cover and may then take shower. Edema Control - Lymphedema / SCD / Other Elevate legs to the level of the heart or above for 30 minutes daily and/or when sitting for 3-4 times a day throughout the day. Avoid standing for long periods of time. Off-Loading Total Contact Cast to Right Lower Extremity - TCC #3. (TCC #4 this week 05/21/23 ) Wound Treatment Wound #1 - Calcaneus Wound Laterality:  Right Cleanser: Soap and Water 1 x Per Week/30 Days Discharge Instructions: May shower and wash wound with dial antibacterial soap and water prior to dressing change. Cleanser: Wound Cleanser (Generic) 1 x Per Week/30 Days Discharge Instructions: Cleanse the wound with wound cleanser prior to applying a clean dressing using gauze sponges, not tissue or cotton balls. Peri-Wound Care: Sween Lotion (Moisturizing lotion) 1 x Per Week/30 Days Discharge Instructions: Apply moisturizing lotion as directed Prim Dressing: Promogran Prisma Matrix, 4.34 (sq in) (silver collagen) 1 x Per Week/30 Days ary Discharge Instructions: Moisten collagen with saline or hydrogel Prim Dressing: TCC 1 x Per Week/30 Days ary Discharge Instructions: #4 this week 05/21/23 Secondary Dressing: Woven Gauze Sponge, Non-Sterile 4x4 in (Generic) 1 x Per Week/30 Days Discharge Instructions: Apply over primary dressing as directed. Secondary Dressing: Zetuvit Plus 4x8 in 1 x Per Week/30 Days Discharge Instructions: Apply over primary dressing as directed. Secured With: [employer], 4.5x3.1 (in/yd) 1 x Per Week/30 Days Discharge Instructions: Secure with Kerlix as directed. Secured With: 50M Medipore H Soft Cloth Surgical T ape, 4 x 10 (in/yd) 1 x Per Week/30 Days Discharge Instructions: Secure with tape as directed. Sanderlin, [name] ([id number]) [id number] Page 6 of 11 Electronic Signature(s) Signed: 05/21/2023 4:14:55 PM By: Karie Schwalbe RN Signed: 05/21/2023 5:04:24  PM By: Allen Derry PA-C Entered By: Karie Schwalbe on 05/21/2023 16:10:96 -------------------------------------------------------------------------------- Problem List Details Patient Name: Date of Service: IONIA, MLECZKO 05/21/2023 9:00 A M Medical Record Number: [id number] Patient Account Number: [id number] Date of Birth/Sex: Treating RN: [birth year] (57 y.o. 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 06/12/2022 No Yes M86.671 Other chronic osteomyelitis, right ankle and foot 08/28/2022 No Yes E11.621 Type 2 diabetes mellitus with foot ulcer 06/12/2022 No Yes I89.0 Lymphedema, not elsewhere classified 06/12/2022 No Yes L97.812 Non-pressure chronic ulcer of other part of right lower leg with fat layer 03/05/2023 No Yes exposed Inactive Problems Resolved Problems Electronic Signature(s) Signed: 05/21/2023 9:57:29 AM By: Allen Derry PA-C Entered By: Allen Derry on 05/21/2023 06:57:29 -------------------------------------------------------------------------------- Progress Note Details Patient Name: Date of Service: [name], [name] 05/21/2023 9:00 A M Medical Record Number: [id number] Patient Account Number: [id number] Date of Birth/Sex: Treating RN: [birth year] (57 y.o. F) Primary Care Provider: [name] Other Clinician: Referring Provider: Treating Provider/Extender: [name] in Treatment: [address], Juliya ([id number]) [id number] Page 7 of 11 Subjective Chief Complaint Information obtained from Patient Right heel ulcer History of Present Illness (HPI) 06-13-2022 upon evaluation today patient presents for evaluation of her right heel ulcer. She is having a tremendous amount of pain at this location. She tells me that her most recent hemoglobin A1c was 8.7 and that was on 08-23-2021. This ulcer on the heel has been present she states since February 2023 when she had an injection to the heel by podiatry. She states that she began to have increasing pain the wound opened and has been open ever since. 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 She had an x-ray in  June if anything can although there did not appear to be any obvious evidence of osteomyelitis at that point. She subsequently has had OR debridement several times of the wound performed by podiatry. Patient does have a history of lymphedema of the lower extremities she is also a type II diabetic. 06-19-2022 upon evaluation today patient appears to be doing better in regard to the size of her leg which is significantly improved. With that being said she had a lot of drainage from the heel which is completely understandable considering what is going on at this time. There does not appear to be any evidence of active infection there is no warmth or irritation to the leg in general. With that being said the patient does have an issue here with the amount of drainage that is going on I definitely think Zetuvit is good to be the better way to go in place of ABD pads. Also think that she could potentially benefit from 3 times per week dressing changes but again right now we are going to stick with the to change it today, change in her Friday, and then subsequently seeing where things stand next Wednesday. The other option would be to change her to a different day for wound care having her come then say like a Tuesday Friday or Monday Thursday. 06-26-2022 upon evaluation patient's wound bed actually showed signs of doing well in regard to the overall size it was measuring slightly smaller. With that being said unfortunately the biggest issue we see is she still has a tremendous amount  of drainage which is what could keep this from being able to use a total contact cast. For that reason I am did going to discuss with her today the possibility of trying Tubigrip to see if that could be of benefit for her. She voiced understanding and is in agreement with giving this a try. 07-03-2022 upon evaluation today patient unfortunately appears to be doing significantly worse at this point in regard to her swelling due to  the fact that she was unable to really wear the Tubigrip. She tells me that it was cutting into her. With that being said she also did have an MRI this MRI revealed that she does have osteomyelitis in the heel this was actually just performed Monday, 25 September. This does show signs of "early osteomyelitis". Nonetheless this is still concerning to me. The wound also appears to be getting deeper in the center aspect of this which has me a little concerned as well. I do believe that she is likely going require an aggressive approach here to try to get things better. I discussed that in greater detail with her today. I will detailed in the plan. 07-17-2022 upon evaluation today patient appears to be doing poorly in regard to her wound. Unfortunately she does not show any signs of infection systemically though locally this seems to be doing worse with the wound actually being bigger than where it was previous. She did have an MRI that showed evidence of osteomyelitis actually recommended a referral to ID she was evaluated infectious disease and they recommended referral to podiatry and to be honest have recommended amputation based on what the patient tells me today. With that being said this is something that she is not interested in at all to be perfectly honest. For that reason she wants to know what she should do and where she should go at this point that is where the majority of the conversation which was quite lengthy today went. Obviously understand her concern here but I think she is going to have to definitely get off this and likely this means she is going to need to come out of work. 10/18; Since the patient was the patient is using Hydrofera Blue on her right heel wound. She has a new larger open area superior to the wound. The skin on the bottom of her foot is completely macerated. I reviewed the infectious disease note on this patient from 10/9. They recommended keeping the patient on  Delofloxacin 450 twice daily until follow-up tomorrow. The patient states she is not going back there as the only thing they wanted to do was "cut my leg off]. She has not seen podiatry. Her lab works shows a CRP markedly elevated at 240.2 a sedimentation rate of 96 the rest of her CBC is normal creatinine of 0.38 The patient has been to see her primary doctor who is Dr. Leonides Schanz [[facility]] In [place]. According the patient Dr. Loralee Pacas has ordered vancomycin and Zosyn to start on November 1. She is not taking the delofloxacin that was suggested by infectious disease. Very angry that they just wanted to consider her for an amputation although I do not specifically see that stated in their note 07-31-2022 upon evaluation today patient still has a significant wound over her plantar aspect of her right l foot region. With that being said I am definitely concerned about the fact that the patient probably does need to be in hyperbaric oxygen therapy at this point. I think we should try  to get this going as quickly as possible. She actually is going to be set up with a PICC line next Wednesday and then following this we will have the vancomycin and Zosyn that she will be taking for 8 weeks according to what she tells me. I think this is definitely going to be beneficial and again the goal here is limb salvage. In combination with that I think the hyperbaric oxygen therapy would be ultimately significantly helpful for her. 08-07-2022 upon evaluation today patient appears to still be doing quite poorly overall she still has not gotten her antibiotics. She got her PICC line today which I assumed she will be getting the first dose of antibiotics as well during that time. With that being said unfortunately she did not get the antibiotics and in fact as I questioned her further she does not even know when or if that is going to be started this week. Again I am very concerned about this considering she already has a PICC line  in place we do not want this to clot off on top of the fact that she should already be on these antibiotics she just never went to the ER for evaluation therefore they never got this started. At this point based on what I am seeing I really think that the ideal thing would be for [place] to see about getting her to the ER for further evaluation and treatment to see if they can get this started for her as soon as possible. The patient voiced understanding and is in agreement with the plan. I gave her recommendations for what should be done also called [facility] infusion therapy in order to see if they would be the ones that seem to be available for her IV infusions but they tell me that they no longer do this that is apparently where the orders were sent to for her infusion therapy. That was by her primary care provider. 08-21-2022 upon evaluation today patient's wound on the bottom of the heel and foot area as well as the toe appear to be doing significantly better. Fortunately I do not see any evidence of infection locally or systemically and everything is measuring much smaller than where we have been. The wound in the left gluteal region also is dramatically improved compared to last time she was here. She has started the antibiotics this is cefepime and vancomycin. She did have a somewhat high trough and therefore they have her hold that today and they are to recheck in the morning I believe she told me. Nonetheless with the antibiotics going she seems to be doing significantly better which is great news. 08-28-2022 upon evaluation today patient appears to be doing somewhat better in regard to her foot there is definitely less drainage than what we have seen in the past. I do believe the antibiotics are helping. With that being said she is also been approved for hyperbaric oxygen therapy and I think as soon as we get this started the better. I discussed that with her today as well. With that being said I do  think that the last thing she needs is her chest x-ray when she has that done will be ready to start her into treatment. She is going to try to go get that today when she leaves which I think would be ideal. 09-11-2022 upon evaluation today patient appears to be doing well with regard to her foot ulcer. This is showing signs of no digression and she does not  appear to be having as much drainage as before though it still very wet is not nearly the extreme of what it was previous. Fortunately there does not appear to be any signs of active infection locally nor systemically at this point which is great news. Patient did have her chest x-ray as well as EKG and she is actually approved and completely cleared for HBO therapy from both a clinical standpoint as well as an insurance standpoint. 09-25-2022 upon evaluation today patient appears to be doing well in regard to her foot ulcer. Unfortunately she is not doing as well when it comes to her hyperbaric treatments. She is having a lot of trouble with claustrophobia. She tells me in general that she is not sure she is good to be able to continue. [name], [name] ([id number]) [id number] Page 8 of 11 10-09-2022 upon evaluation today patient's wounds actually are showing signs of excellent improvement which is great news. Fortunately I do not see any evidence of infection locally or systemically which is great news and overall I am extremely pleased with where things are currently. I do think that she is making good progress she does tell me at this point today she does not want to go forward with a hyperbaric oxygen therapy. 10-16-2022 upon evaluation today patient appears to be doing excellent in regard to her foot ulcer. This is actually showing signs of excellent improvement. She still does not allow for any sharp debridement but the good news is this is doing much better and does not even need it at the moment. Nonetheless as far  as sharp debridement is concerned this has been too painful for really not been able to do that all along. 10-23-2022 upon evaluation today patient actually is making excellent progress. I am extremely pleased with where we stand I have considered even seeing about putting her in the total contact cast but to be honest she is doing so well currently and still with having some drainage I feel like it is better for her to be able to change it then to be locked up in the cast for a week at a time. She has voiced understanding as well. Overall though I feel like you are making some really good progress here. 10-30-2022 upon evaluation today patient appears to be doing excellent in regard to her foot ulcers. Both are showing signs of improvement which is great news and overall I feel like that they are measuring smaller, looking better, and I see no signs of resurgence of the infection which is great news. Overall I am extremely pleased at this point. 2/7; the patient has 2 wounds which are remanence of the large wound on the right foot. This was a diabetic wound with underlying osteomyelitis she has completed antibiotics. Today she has the open area on the distal calcaneus and an area on the right midfoot which is eschared. She has been using silver alginate and offloading this in a regular running shoe. 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 11-27-2022 upon evaluation today patient appears to be doing well currently in regard to her wound which is actually showing signs of significant improvement this is great news. Fortunately there does not appear to be any signs of active infection locally nor systemically which is also excellent. No fevers, chills, nausea, vomiting, or diarrhea. 12-25-2022 upon evaluation today patient appears to be doing well currently in regard to the distal portion  of her foot which is actually in my opinion  looking a little better but have a lot of callus she is going require some debridement here. With that being said the heel actually looks like it might be a little bit worse. There is a lot of callus I cannot tell exactly what is open or not we can remove that and we will see what exactly were dealing with there. With that being said she also has an area on the right side of her leg which appears to be more of a pressure injury she tells me this is from sitting in her recliner she has gotten a pillow to help take care of this. Fortunately it is not technically open and draining at this point made it very well may be before it is also not done. 01-01-2023 upon evaluation today patient appears to be doing well currently in regard to her wounds I feel like the cefdinir has helped she seems to be improved compared to last week's evaluation. I do not see any signs of active infection locally nor systemically at this time which is great news. No fevers, chills, nausea, vomiting, or diarrhea. 01-08-2023 upon evaluation today patient's wounds on the foot appear to be doing decently well the leg is still about the same. With that being said unfortunately she does have a lot of erythema on the leg in particular which is unfortunate and definitely not what we are looking for. Again I do not think that the [facility] has really done well for her in that regard. 01-15-2023 upon evaluation today patient appears to be doing well currently in regard to her wound. Overall I think that things are measuring a little bit better but unfortunately the erythema is spreading. I did review her culture today. Unfortunately it does show that she does have MRSA noted and again I think that we need to do something to try to get this under control as quickly as possible. She has previously been on IV antibiotics and unfortunately that has not been sufficient to get this completely cleared it seems to have come back at this point and she  really wants to avoid going back on IV antibiotics. We have been using oral antibiotics and various forms the most recent was actually a course of cefdinir. She is also previously been on clindamycin and even in the past she has had to doxycycline. With that being said with all things considered I think we are still having a lot of trouble here keeping this infection under control. We previously attempted hyperbarics but the patient did not tolerate this well. She therefore discontinued HBO therapy. 01-22-2023 upon evaluation today patient appears to be doing a little better in regard to the cellulitis on her right leg. I am much more pleased with what I am seeing today. With that being said unfortunately she is still having quite a bit of issues here with erythema though not as bad I think that were not completely clear. Unfortunately we were denied for the [facility] prescription. She has the medication but I provided for her by way of samples but nothing further. 01-29-2023 upon evaluation today patient appears to be doing poorly currently in regard to her leg. I do not feel like she is showing as much improvement as she was within desirable now that we switch her to linezolid. With that being said I do believe that we probably need to see about IV antibiotics I recommended a referral back to ID. With  that being said she would prefer to see her physician which I am okay with but either way I think she needs to be back on IV antibiotics for a minimum of 2 months as soon as possible. I am going to send her for lab work today in order to get some baseline labs but honestly I think that we are going to require IV antibiotics to get this under control. 02-12-2023 upon evaluation today patient appears to be doing well currently in regard to her wound all things considered. She has not gotten the lab work done yet that we ordered a couple weeks back. She tells me she has had a lot going on with the death of the  family. Fortunately there does not appear to be any signs of worsening in fact some of the heel looks a little bit better which is great news. Fortunately I do not see any evidence of active infection locally nor systemically which is great news. 02-19-24 upon evaluation today patient appears to be doing well currently in regard to her wounds. She has been tolerating the dressing changes without complication. Fortunately there does not appear to be any signs of active infection locally nor systemically which is great news. No fevers, chills, nausea, vomiting, or diarrhea. She is currently on vancomycin and Zosyn through IV which is doing I think well she has been on it for not quite a week at this point. 03-05-2023 upon evaluation today patient appears to be doing well currently in regard to her wound. She has been tolerating the dressing changes all 3 locations actually are significantly smaller even compared to last week I am very pleased with where we stand. I think the antibiotics are really doing a good job here for her. 03-12-2023 upon evaluation today patient has actually continued to make excellent progress in regard to her wounds. I am very pleased with where we stand in fact I think her leg is healed the middle part of her foot I think is close to being closed and the heel does look better though there is some callus that needs to be removed at this point. I did discuss that with her today. 03-19-2023 upon evaluation today patient's wounds are actually showing signs of excellent improvement. I am actually very pleased with where we stand and I think that she is making really good progress here. I do not see any signs of active infection locally nor systemically which is great news. 03-26-2023 upon evaluation today patient's wounds actually are showing signs of improvement in fact the middle of her foot is pretty much appearing to be completely healed which is great news. This means we are just  dealing with the heel itself on the bottom of the foot as far as anything remaining is concerned at this point. I am extremely pleased with where we stand at this time. 04-09-2023 upon evaluation today patient appears to be doing well currently in regard to the periwound. She has been tolerating the dressing changes without complication. Fortunately there does not appear to be any signs of active infection locally or systemically at this time. Potentially seeing about getting a total contact cast on and try to get this closed as quickly as possible. I think the casting is probably our best bet at this point and the patient is in agreement with that plan. At this point based on what I am seeing I do believe that the patient would benefit from a continuation of care with regard to  dressing changes regularly. 04-16-2023 upon evaluation today patient appears to be doing well currently in regard to her wound which is actually showing signs of good improvement. Fortunately I do not see any evidence of active infection locally or systemically which is great news and in general I do believe that we are making good headway towards complete closure. I do think she is a candidate however for total contact cast which I think will speed up the closure process. [name], [name] ([id number]) [id number] Page 9 of 11 7/12; patient presents for follow-up. She is here for our obligatory cast change. She had the cast first placed two days ago and Hydrofera Blue was under the cast. She has no issues or complaints today. 04-23-2023 upon evaluation today patient appears to be doing well currently in regard to her wound. She is actually showing signs of excellent improvement and in general I feel like that we are making great headway towards closure. Her wound is significantly improved compared to last week when I saw her the cast is doing great despite the fact got so loose that today the cast actually  slipped off without [place] even having to cut it with a cast saw. Obviously she is lost quite a bit of fluid in fact we did for inch cast last time she is definitely going to be in the 3 today she has lost a tremendous amount of fluid in this leg which is actually really good news. I do not see any signs of active infection locally nor systemically at this point. 7/24; patient came in today with a size 3 total contact cast quite loose in fact it came off without being manually cut off. This is no doubt due to reduction in the amount of edema in her right lower leg. This should not be an ongoing issue. The wound is on the plantar right heel it was originally due to a injection she had for plantar fasciitis according to the patient. Her wound has been doing well 05-07-2023 upon evaluation today patient appears to be doing well currently in regard to her wound. She has been tolerating the dressing changes without complication. Fortunately I do not see any signs of active infection locally nor systemically which is great news. 05-21-2023 upon evaluation today patient appears to be doing well currently in regard to her wound on the heel. She has been tolerating the dressing changes without complication. Fortunately this is definitely getting smaller and looking much better and very pleased at this point. I do not see any signs of active infection locally nor systemically at this time. Objective Constitutional Well-nourished and well-hydrated in no acute distress. Vitals Time Taken: 9:11 AM, Weight: 331 lbs, Temperature: 98.1 F, Pulse: 91 bpm, Respiratory Rate: 18 breaths/min, Blood Pressure: 117/83 mmHg. Respiratory normal breathing without difficulty. Psychiatric this patient is able to make decisions and demonstrates good insight into disease process. Alert and Oriented x 3. pleasant and cooperative. General Notes: Upon inspection patient's wound bed actually showed signs of good granulation and  epithelization at this point. Fortunately I do not see any evidence of worsening overall and I do believe that the patient is making good headway towards complete closure which is great news. Integumentary (Hair, Skin) Wound #1 status is Open. Original cause of wound was Gradually Appeared. The date acquired was: 11/07/2020. The wound has been in treatment 49 weeks. The wound is located on the Right Calcaneus. The wound measures 0.3cm length x 0.4cm width x 0.3cm depth; 0.094cm^2 area and  0.028cm^3 volume. There is Fat Layer (Subcutaneous Tissue) exposed. There is no tunneling or undermining noted. There is a medium amount of serosanguineous drainage noted. The wound margin is thickened. There is small (1-33%) red, pink granulation within the wound bed. There is a large (67-100%) amount of necrotic tissue within the wound bed including Eschar and Adherent Slough. The periwound skin appearance exhibited: Callus. The periwound skin appearance did not exhibit: Crepitus, Excoriation, Induration, Rash, Scarring, Dry/Scaly, Maceration, Atrophie Blanche, Cyanosis, Ecchymosis, Hemosiderin Staining, Mottled, Pallor, Rubor, Erythema. Periwound temperature was noted as No Abnormality. The periwound has tenderness on palpation. Assessment Active Problems ICD-10 Non-pressure chronic ulcer of right heel and midfoot with fat layer exposed Other chronic osteomyelitis, right ankle and foot Type 2 diabetes mellitus with foot ulcer Lymphedema, not elsewhere classified Non-pressure chronic ulcer of other part of right lower leg with fat layer exposed Procedures Wound #1 Pre-procedure diagnosis of Wound #1 is a Diabetic Wound/Ulcer of the Lower Extremity located on the Right Calcaneus .Severity of Tissue Pre Debridement is: Fat layer exposed. There was a Excisional Skin/Subcutaneous Tissue Debridement with a total area of 0.09 sq cm performed by Lenda Kelp, PA. With the following instrument(s): Curette to remove  Viable and Non-Viable tissue/material. Material removed includes Callus, Subcutaneous Tissue, and Slough after achieving pain control using Lidocaine 5% topical ointment. No specimens were taken. A time out was conducted at 09:20, prior to the start of the procedure. A Minimum amount of bleeding was controlled with Pressure. The procedure was tolerated well with a pain level of 0 throughout and a pain level of 0 following the procedure. Post Debridement Measurements: 0.3cm length x 0.4cm width x 0.3cm depth; 0.028cm^3 volume. Character of Wound/Ulcer Post Debridement is improved. Severity of Tissue Post Debridement is: Fat layer exposed. Post procedure Diagnosis Wound #1: Same as Pre-Procedure General Notes: Jossette Zirbel STONE III PA-C, by J.Scotton RN. Pre-procedure diagnosis of Wound #1 is a Diabetic Wound/Ulcer of the Lower Extremity located on the Right Calcaneus . There was a T Contact Cast otal Procedure by Lenda Kelp, PA. [name], [name] ([id number]) [id number] Page 10 of 11 Post procedure Diagnosis Wound #1: Same as Pre-Procedure Notes: TCC #4 this week. Plan Follow-up Appointments: Return Appointment in 1 week. Allen Derry III PA-C Wednesday 05/28/23 at 8:45am(already scheduled) Return Appointment in 2 weeks. [name] Wednesday 06/04/23 at 8:45am (already scheduled) Anesthetic: (In clinic) Topical Lidocaine 5% applied to wound bed Bathing/ Shower/ Hygiene: May shower with protection but do not get wound dressing(s) wet. Protect dressing(s) with water repellant cover (for example, large plastic bag) or a cast cover and may then take shower. Edema Control - Lymphedema / SCD / Other: Elevate legs to the level of the heart or above for 30 minutes daily and/or when sitting for 3-4 times a day throughout the day. Avoid standing for long periods of time. Off-Loading: T Contact Cast to Right Lower Extremity - TCC #3. (TCC #4 this week 05/21/23 ) otal WOUND #1: -  Calcaneus Wound Laterality: Right Cleanser: Soap and Water 1 x Per Week/30 Days Discharge Instructions: May shower and wash wound with dial antibacterial soap and water prior to dressing change. Cleanser: Wound Cleanser (Generic) 1 x Per Week/30 Days Discharge Instructions: Cleanse the wound with wound cleanser prior to applying a clean dressing using gauze sponges, not tissue or cotton balls. Peri-Wound Care: Sween Lotion (Moisturizing lotion) 1 x Per Week/30 Days Discharge Instructions: Apply moisturizing lotion as directed Prim Dressing: Promogran Prisma Matrix, 4.34 (sq  in) (silver collagen) 1 x Per Week/30 Days ary Discharge Instructions: Moisten collagen with saline or hydrogel Prim Dressing: TCC 1 x Per Week/30 Days ary Discharge Instructions: #4 this week 05/21/23 Secondary Dressing: Woven Gauze Sponge, Non-Sterile 4x4 in (Generic) 1 x Per Week/30 Days Discharge Instructions: Apply over primary dressing as directed. Secondary Dressing: Zetuvit Plus 4x8 in 1 x Per Week/30 Days Discharge Instructions: Apply over primary dressing as directed. Secured With: [employer], 4.5x3.1 (in/yd) 1 x Per Week/30 Days Discharge Instructions: Secure with Kerlix as directed. Secured With: 85M Medipore H Soft Cloth Surgical T ape, 4 x 10 (in/yd) 1 x Per Week/30 Days Discharge Instructions: Secure with tape as directed. 1. I would recommend that we going continue with total contact cast we will have to go with a size 4 cast as we are out of the threes at the moment I do believe the 3 however was fitting her better. 2. I am going to recommend as well that the patient should continue to monitor for any signs of infection or worsening. Overall based on what I am seeing I do believe that we are making good headway however towards complete closure. We will see patient back for reevaluation in 1 week here in the clinic. If anything worsens or changes patient will contact our office for  additional recommendations. Electronic Signature(s) Signed: 05/25/2023 10:48:24 AM By: Shawn Stall RN, BSN Signed: 05/30/2023 1:58:20 PM By: Allen Derry PA-C Previous Signature: 05/21/2023 9:59:13 AM Version By: Allen Derry PA-C Entered By: Shawn Stall on 05/25/2023 07:41:10 -------------------------------------------------------------------------------- Total Contact Cast Details Patient Name: Date of Service: LENNA, MUSACCHIA 05/21/2023 9:00 A M Medical Record Number: [id number] Patient Account Number: [id number] Date of Birth/Sex: Treating RN: 03-21-1966 (56 y.o. [name] Primary Care Provider: [name] Other Clinician: Referring Provider: Treating Provider/Extender: [name] in Treatment: 74 T Contact Cast Applied for Wound Assessment: otal Wound #1 Right Calcaneus Performed By: Physician Lenda Kelp, PA Post Procedure Diagnosis Same as Pre-procedure Notes Aime, Brenlynn ([id number]) [id number] Page 11 of 11 TCC #4 this week Electronic Signature(s) Signed: 05/21/2023 4:14:55 PM By: Karie Schwalbe RN Signed: 05/21/2023 5:04:24 PM By: Allen Derry PA-C Entered By: Karie Schwalbe on 05/21/2023 06:56:36 -------------------------------------------------------------------------------- SuperBill Details Patient Name: Date of Service: YAMILETT, JANOWIAK 05/21/2023 Medical Record Number: [id number] Patient Account Number: [id number] Date of Birth/Sex: Treating RN: 27-Aug-1966 (57 y.o. F) Primary Care Provider: [name] Other Clinician: Referring Provider: Treating Provider/Extender: [name] in Treatment: 9 Diagnosis Coding ICD-10 Codes Code Description [phone] Non-pressure chronic ulcer of right heel and midfoot with fat layer exposed M86.671 Other chronic osteomyelitis, right ankle and foot E11.621 Type 2 diabetes mellitus with foot ulcer I89.0 Lymphedema, not elsewhere  classified L97.812 Non-pressure chronic ulcer of other part of right lower leg with fat layer exposed Facility Procedures : CPT4 Code: 63016010 Description: 11042 - DEB SUBQ TISSUE 20 SQ CM/< ICD-10 Diagnosis Description L97.412 Non-pressure chronic ulcer of right heel and midfoot with fat layer exposed Modifier: Quantity: 1 Physician Procedures : CPT4 Code Description Modifier [id number] 11042 - WC PHYS SUBQ TISS 20 SQ CM ICD-10 Diagnosis Description L97.412 Non-pressure chronic ulcer of right heel and midfoot with fat layer exposed Quantity: 1 Electronic Signature(s) Signed: 05/25/2023 10:48:24 AM By: Shawn Stall RN, BSN Signed: 05/30/2023 1:58:20 PM By: Allen Derry PA-C Previous Signature: 05/21/2023 10:00:59 AM Version By: Allen Derry PA-C Entered By: Shawn Stall on 05/25/2023 07:41:51

## 2023-05-28 ENCOUNTER — Encounter (HOSPITAL_BASED_OUTPATIENT_CLINIC_OR_DEPARTMENT_OTHER): Payer: No Typology Code available for payment source | Admitting: Physician Assistant

## 2023-05-28 DIAGNOSIS — E11621 Type 2 diabetes mellitus with foot ulcer: Secondary | ICD-10-CM | POA: Diagnosis not present

## 2023-05-28 NOTE — Progress Notes (Signed)
Dohner, [name] ([id number]) [id number] Page 1 of 2 Visit Report for 05/28/2023 Chief Complaint Document Details Patient Name: Date of Service: [name], [name] 05/28/2023 8:45 A M Medical Record Number: [id number] Patient Account Number: [id number] Date of Birth/Sex: Treating RN: [birth year] (57 y.o. F) Primary Care Provider: [name] Other Clinician: Referring Provider: Treating Provider/Extender: [name] in Treatment: 50 Information Obtained from: Patient Chief Complaint Right heel ulcer Electronic Signature(s) Signed: 05/28/2023 9:41:27 AM By: Allen Derry PA-C Entered By: Allen Derry on 05/28/2023 06:41:27 -------------------------------------------------------------------------------- Problem List Details Patient Name: Date of Service: [name], [name] 05/28/2023 8:45 A M Medical Record Number: [id number] Patient Account Number: [id number] Date of Birth/Sex: Treating RN: [birth year] (57 y.o. F) Primary Care Provider: [name] Other Clinician: Referring Provider: Treating Provider/Extender: [name] in Treatment: 81 Active Problems ICD-10 Encounter Code Description Active Date MDM Diagnosis L97.412 Non-pressure chronic ulcer of right heel and midfoot with fat 06/12/2022 No Yes layer exposed M86.671 Other chronic osteomyelitis, right ankle and foot 08/28/2022 No Yes E11.621 Type 2 diabetes mellitus with foot ulcer 06/12/2022 No Yes I89.0 Lymphedema, not elsewhere classified 06/12/2022 No Yes L97.812 Non-pressure chronic ulcer of other part of right lower leg with 03/05/2023 No Yes fat layer exposed Pimenta, Launi ([id number]) [id number] Page 2 of 2 Inactive Problems Resolved Problems Electronic Signature(s) Signed: 05/28/2023 9:41:09 AM By: Allen Derry PA-C Entered By: Allen Derry on 05/28/2023 06:41:08

## 2023-05-29 NOTE — Progress Notes (Signed)
[name], [name] ([id number]) [id number] Page 1 of 6 Visit Report for 05/28/2023 Arrival Information Details Patient Name: Date of Service: [name], [name] 05/28/2023 8:45 A M Medical Record Number: [id number] Patient Account Number: [id number] Date of Birth/Sex: Treating RN: 05/20/1966 (57 y.o. F) Primary Care [name]: [name] Other Clinician: Referring [name]: Treating [name]/Extender: [name] in Treatment: 50 Visit Information History Since Last Visit Added or deleted any medications: No Patient Arrived: Ambulatory Any new allergies or adverse reactions: No Arrival Time: 09:10 Had a fall or experienced change in No Accompanied By: son activities of daily living that may affect Transfer Assistance: None risk of falls: Patient Identification Verified: Yes Signs or symptoms of abuse/neglect since last visito No Secondary Verification Process Completed: Yes Hospitalized since last visit: No Patient Requires Transmission-Based Precautions: No Implantable device outside of the clinic excluding No Patient Has Alerts: Yes cellular tissue based products placed in the center Patient Alerts: PICC in left arm since last visit: Has Dressing in Place as Prescribed: Yes Pain Present Now: No Electronic Signature(s) Signed: 05/29/2023 5:02:38 PM By: Thayer Dallas Entered By: Thayer Dallas on 05/28/2023 06:11:11 -------------------------------------------------------------------------------- Encounter Discharge Information Details Patient Name: Date of Service: [name], [name] 05/28/2023 8:45 A M Medical Record Number: [id number] Patient Account Number: [id number] Date of Birth/Sex: Treating RN: 1966-07-09 (57 y.o. [name] Primary Care [name]: [name] Other Clinician: Referring [name]: Treating [name]/Extender: [name] in Treatment: 31 Encounter Discharge Information Items Post Procedure  Vitals Discharge Condition: Stable Temperature (F): 98.4 Ambulatory Status: Ambulatory Pulse (bpm): 89 Discharge Destination: Home Respiratory Rate (breaths/min): 18 Transportation: Private Auto Blood Pressure (mmHg): 121/83 Accompanied By: self Schedule Follow-up Appointment: Yes Clinical Summary of Care: Electronic Signature(s) Signed: 05/28/2023 7:54:18 PM By: Shawn Stall RN, BSN Entered By: Shawn Stall on 05/28/2023 06:57:39 [name], [name] ([id number]) [id number] Page 2 of 6 -------------------------------------------------------------------------------- Lower Extremity Assessment Details Patient Name: Date of Service: [name], [name] 05/28/2023 8:45 A M Medical Record Number: [id number] Patient Account Number: [id number] Date of Birth/Sex: Treating RN: April 16, 1966 (57 y.o. F) Primary Care [name]: [name] Other Clinician: Referring [name]: Treating [name]/Extender: [name] in Treatment: 50 Edema Assessment Assessed: [Left: No] [Right: No] Edema: [Left: Ye] [Right: s] Calf Left: Right: Point of Measurement: 32 cm From Medial Instep 49 cm Ankle Left: Right: Point of Measurement: 10 cm From Medial Instep 28.5 cm Vascular Assessment Extremity colors, hair growth, and conditions: Extremity Color: [Right:Normal] Hair Growth on Extremity: [Right:No] Temperature of Extremity: [Right:Warm] Capillary Refill: [Right:< 3 seconds] Dependent Rubor: [Right:No No] Electronic Signature(s) Signed: 05/29/2023 5:02:38 PM By: Thayer Dallas Entered By: Thayer Dallas on 05/27/2020 06:21:40 -------------------------------------------------------------------------------- Multi-Disciplinary Care Plan Details Patient Name: Date of Service: [name], [name] 05/28/2023 8:45 A M Medical Record Number: [id number] Patient Account Number: [id number] Date of Birth/Sex: Treating RN: July 15, 1966 (57 y.o. [name] Primary Care [name]:  [name] Other Clinician: Referring [name]: Treating [name]/Extender: [name] in Treatment: 50 Active Inactive Wound/Skin Impairment Nursing Diagnoses: Impaired tissue integrity Knowledge deficit related to ulceration/compromised skin integrity Goals: Patient will have a decrease in wound volume by X% from date: (specify in notes) Date Initiated: 06/12/2022 T arget Resolution Date: 08/07/2023 Goal Status: Active [name], [name] ([id number]) [id number] Page 3 of 6 Patient/caregiver will verbalize understanding of skin care regimen Date Initiated: 06/12/2022 Target Resolution Date: 08/07/2023 Goal Status: Active Ulcer/skin breakdown will have a volume reduction of 30% by week 4 Date Initiated: 06/12/2022 Date Inactivated:  07/31/2022 Target Resolution Date: 08/03/2022 Goal Status: Unmet Unmet Reason: dx with osteomyelitis. Ulcer/skin breakdown will have a volume reduction of 50% by week 8 Date Initiated: 06/12/2022 Date Inactivated: 10/16/2022 Target Resolution Date: 10/05/2022 Unmet Reason: see wound Goal Status: Unmet measurement. IV antibiotics Interventions: Assess patient/caregiver ability to obtain necessary supplies Assess patient/caregiver ability to perform ulcer/skin care regimen upon admission and as needed Assess ulceration(s) every visit Notes: Electronic Signature(s) Signed: 05/28/2023 7:54:18 PM By: Shawn Stall RN, BSN Entered By: Shawn Stall on 05/28/2023 06:56:06 -------------------------------------------------------------------------------- Pain Assessment Details Patient Name: Date of Service: [name], [name] 05/28/2023 8:45 A M Medical Record Number: [id number] Patient Account Number: [id number] Date of Birth/Sex: Treating RN: 1966/09/27 (57 y.o. F) Primary Care Iker Nuttall: [name] Other Clinician: Referring [name]: Treating [name]/Extender: [name] in Treatment:  50 Active Problems Location of Pain Severity and Description of Pain Patient Has Paino No Site Locations Pain Management and Medication Current Pain Management: Electronic Signature(s) Signed: 05/29/2023 5:02:38 PM By: Thayer Dallas Entered By: Thayer Dallas on 08/21/2rta Silence Primary Care [name]: [name] Other Clinician: Referring [name]: Treating [name]/Extender: [name] in Treatment: 31 Encounter Discharge Information Items Post Procedure  Vitals Discharge Condition: Stable Temperature (F): 98.4 Ambulatory Status: Ambulatory Pulse (bpm): 89 Discharge Destination: Home Respiratory Rate (breaths/min): 18 Transportation: Private Auto Blood Pressure (mmHg): 121/83 Accompanied By: self Schedule Follow-up Appointment: Yes Clinical Summary of Care: Electronic Signature(s) Signed: 05/28/2023 7:54:18 PM By: Shawn Stall RN, BSN Entered By: Shawn Stall on 05/28/2023 06:57:39 [name], [name] ([id number]) [id number] Page 2 of 6 -------------------------------------------------------------------------------- Lower Extremity Assessment Details Patient Name: Date of Service: [name], [name] 05/28/2023 8:45 A M Medical Record Number: [id number] Patient Account Number: [id number] Date of Birth/Sex: Treating RN: April 16, 1966 (57 y.o. F) Primary Care [name]: [name] Other Clinician: Referring [name]: Treating [name]/Extender: [name] in Treatment: 50 Edema Assessment Assessed: [Left: No] [Right: No] Edema: [Left: Ye] [Right: s] Calf Left: Right: Point of Measurement: 32 cm From Medial Instep 49 cm Ankle Left: Right: Point of Measurement: 10 cm From Medial Instep 28.5 cm Vascular Assessment Extremity colors, hair growth, and conditions: Extremity Color: [Right:Normal] Hair Growth on Extremity: [Right:No] Temperature of Extremity: [Right:Warm] Capillary Refill: [Right:< 3 seconds] Dependent Rubor: [Right:No No] Electronic Signature(s) Signed: 05/29/2023 5:02:38 PM By: Thayer Dallas Entered By: Thayer Dallas on 05/28/2023 06:21:40 -------------------------------------------------------------------------------- Multi-Disciplinary Care Plan Details Patient Name: Date of Service: [name], [name] 05/28/2023 8:45 A M Medical Record Number: [id number] Patient Account Number: [id number] Date of Birth/Sex: Treating RN: July 15, 1966 (57 y.o. [name] Primary Care [name]:  [name] Other Clinician: Referring [name]: Treating [name]/Extender: [name] in Treatment: 50 Active Inactive Wound/Skin Impairment Nursing Diagnoses: Impaired tissue integrity Knowledge deficit related to ulceration/compromised skin integrity Goals: Patient will have a decrease in wound volume by X% from date: (specify in notes) Date Initiated: 06/12/2022 T arget Resolution Date: 08/07/2023 Goal Status: Active [name], [name] ([id number]) [id number] Page 3 of 6 Patient/caregiver will verbalize understanding of skin care regimen Date Initiated: 06/12/2022 Target Resolution Date: 08/07/2023 Goal Status: Active Ulcer/skin breakdown will have a volume reduction of 30% by week 4 Date Initiated: 06/12/2022 Date Inactivated:  07/31/2022 Target Resolution Date: 08/03/2022 Goal Status: Unmet Unmet Reason: dx with osteomyelitis. Ulcer/skin breakdown will have a volume reduction of 50% by week 8 Date Initiated: 06/12/2022 Date Inactivated: 10/16/2022 Target Resolution Date: 10/05/2022 Unmet Reason: see wound Goal Status: Unmet measurement. IV antibiotics Interventions: Assess patient/caregiver ability to obtain necessary supplies Assess patient/caregiver ability to perform ulcer/skin care regimen upon admission and as needed Assess ulceration(s) every visit Notes: Electronic Signature(s) Signed: 05/28/2023 7:54:18 PM By: Shawn Stall RN, BSN Entered By: Shawn Stall on 05/28/2023 06:56:06 -------------------------------------------------------------------------------- Pain Assessment Details Patient Name: Date of Service: [name], [name] 05/28/2023 8:45 A M Medical Record Number: [id number] Patient Account Number: [id number] Date of Birth/Sex: Treating RN: 1966/09/27 (57 y.o. F) Primary Care Iker Nuttall: [name] Other Clinician: Referring [name]: Treating [name]/Extender: [name] in Treatment:  50 Active Problems Location of Pain Severity and Description of Pain Patient Has Paino No Site Locations Pain Management and Medication Current Pain Management: Electronic Signature(s) Signed: 05/29/2023 5:02:38 PM By: Thayer Dallas Entered By: Thayer Dallas on 08/21/2 Silence Primary Care [name]:  [name] Other Clinician: Referring [name]: Treating [name]/Extender: [name] in Treatment: 50 Active Inactive Wound/Skin Impairment Nursing Diagnoses: Impaired tissue integrity Knowledge deficit related to ulceration/compromised skin integrity Goals: Patient will have a decrease in wound volume by X% from date: (specify in notes) Date Initiated: 06/12/2022 T arget Resolution Date: 08/07/2023 Goal Status: Active [name], [name] ([id number]) [id number] Page 3 of 6 Patient/caregiver will verbalize understanding of skin care regimen Date Initiated: 06/12/2022 Target Resolution Date: 08/07/2023 Goal Status: Active Ulcer/skin breakdown will have a volume reduction of 30% by week 4 Date Initiated: 06/12/2022 Date Inactivated:  07/31/2022 Target Resolution Date: 08/03/2022 Goal Status: Unmet Unmet Reason: dx with osteomyelitis. Ulcer/skin breakdown will have a volume reduction of 50% by week 8 Date Initiated: 06/12/2022 Date Inactivated: 10/16/2022 Target Resolution Date: 10/05/2022 Unmet Reason: see wound Goal Status: Unmet measurement. IV antibiotics Interventions: Assess patient/caregiver ability to obtain necessary supplies Assess patient/caregiver ability to perform ulcer/skin care regimen upon admission and as needed Assess ulceration(s) every visit Notes: Electronic Signature(s) Signed: 05/28/2023 7:54:18 PM By: Shawn Stall RN, BSN Entered By: Shawn Stall on 05/28/2023 06:56:06 -------------------------------------------------------------------------------- Pain Assessment Details Patient Name: Date of Service: [name], [name] 05/28/2023 8:45 A M Medical Record Number: [id number] Patient Account Number: [id number] Date of Birth/Sex: Treating RN: 1966/09/27 (57 y.o. F) Primary Care Iker Nuttall: [name] Other Clinician: Referring [name]: Treating [name]/Extender: [name] in Treatment:  50 Active Problems Location of Pain Severity and Description of Pain Patient Has Paino No Site Locations Pain Management and Medication Current Pain Management: Electronic Signature(s) Signed: 05/29/2023 5:02:38 PM By: Thayer Dallas Entered By: Thayer Dallas on 05/28/2023 06:11:21 [name], [name] ([id number]) [id number] Page 4 of 6 -------------------------------------------------------------------------------- Patient/Caregiver Education Details Patient Name: Date of Service: [name], [name] 8/21/2024andnbsp8:45 A M Medical Record Number: [id number] Patient Account Number: [id number] Date of Birth/Gender: Treating RN: [birth year] (57 y.o. [name] Primary Care Physician: [name] Other Clinician: Referring Physician: Treating Physician/Extender: [name] in Treatment: 41 Education Assessment Education Provided To: Patient Education Topics Provided Wound/Skin Impairment: Handouts: Caring for Your Ulcer Methods: Explain/Verbal Responses: Reinforcements needed Electronic Signature(s) Signed: 05/28/2023 7:54:18 PM By: Shawn Stall RN, BSN Entered By: Shawn Stall on 05/28/2023 06:56:25 -------------------------------------------------------------------------------- Wound Assessment Details Patient Name: Date of Service: [name], [name] 05/28/2023 8:45 A M Medical Record Number: [id number] Patient Account Number: [id number] Date of Birth/Sex: Treating RN: [birth year] (57 y.o. F) Primary Care Farin Buhman: [name] Other Clinician: Referring Lewi Drost: Treating Demarion Pondexter/Extender: [name] in Treatment: 50 Wound Status Wound Number: 1 Primary Etiology: Diabetic Wound/Ulcer of the Lower Extremity Wound Location: Right Calcaneus Wound Status: Open Wounding Event: Gradually Appeared Comorbid History: Hypertension, Type II Diabetes, Osteomyelitis Date Acquired: 11/07/2020 Weeks Of Treatment:  50 Clustered Wound: Yes Photos Wound Measurements Length: (cm) [name], [name] ([id number]) Width: (cm) Depth: (cm) Clustered Quantity: Area: (cm) Volume: (cm) 0.3 % Reduction in Area: 99.8% [id number] Page 5 of 6 0.1 % Reduction in Volume: 99.8% 0.2 Epithelialization: Large (67-100%) 1 Tunneling: No 0.024 Undermining: No 0.005 Wound Description Classification: Grade 3 Wound Margin: Thickened Exudate Amount: Medium Exudate Type: Serosanguineous Exudate Color: red, brown Foul Odor After Cleansing: No Slough/Fibrino No Wound Bed Granulation Amount: Large (67-100%) Exposed Structure Granulation Quality: Red Fascia Exposed: No Necrotic Amount: None Present (0%) Fat Layer (Subcutaneous Tissue) Exposed: Yes Tendon Exposed: No Muscle Exposed: No Joint Exposed: No Bone Exposed: No Periwound Skin Texture Texture Color No Abnormalities Noted: No No Abnormalities Noted: No Callus: Yes Atrophie Blanche: No Crepitus: No Cyanosis: No Excoriation: No Ecchymosis: No Induration: No Erythema: No Rash: No Hemosiderin Staining: No Scarring: No Mottled: No Pallor: No Moisture Rubor: No No Abnormalities Noted: No Dry / Scaly: No Temperature / Pain Maceration: No Temperature: No Abnormality Tenderness on Palpation: Yes Treatment Notes Wound #1 (Calcaneus) Wound Laterality: Right Cleanser Soap and Water Discharge Instruction: May shower and wash wound with dial antibacterial soap and water prior to dressing change. Wound Cleanser Discharge Instruction: Cleanse the wound with wound cleanser prior to applying a clean dressing using gauze sponges, not tissue or cotton balls.  Peri-Wound Care Sween Lotion (Moisturizing lotion) Discharge Instruction: Apply moisturizing lotion as directed Topical Primary Dressing Promogran Prisma Matrix, 4.34 (sq in) (silver collagen) Discharge Instruction: Moisten collagen with saline or hydrogel Secondary  Dressing Woven Gauze Sponge, Non-Sterile 4x4 in Discharge Instruction: Apply over primary dressing as directed. Zetuvit Plus 4x8 in Discharge Instruction: Apply over primary dressing as directed. Secured With [employer], 4.5x3.1 (in/yd) Discharge Instruction: Secure with Kerlix as directed. 50M Medipore H Soft Cloth Surgical T ape, 4 x 10 (in/yd) Discharge Instruction: Secure with tape as directed. T Contact Cast #3 otal Discharge Instruction: applied in clinic. last layer applied by Teyanna Thielman. Compression Wrap [name], [name] [id number]) [id number] Page 6 of 6 Compression Stockings Add-Ons Electronic Signature(s) Signed: 05/29/2023 5:02:38 PM By: Thayer Dallas Entered By: Thayer Dallas on 05/28/2023 06:26:59 -------------------------------------------------------------------------------- Vitals Details Patient Name: Date of Service: KYLAN, GEHMAN 05/28/2023 8:45 A M Medical Record Number: [id number] Patient Account Number: [id number] Date of Birth/Sex: Treating RN: 1966-07-25 (57 y.o. F) Primary Care Josph Norfleet: [name] Other Clinician: Referring Lakin Rhine: Treating Issacc Merlo/Extender: [name] in Treatment: 50 Vital Signs Time Taken: 09:26 Temperature (F): 98.4 Weight (lbs): 331 Pulse (bpm): 89 Respiratory Rate (breaths/min): 18 Blood Pressure (mmHg): 121/83 Reference Range: 80 - 120 mg / dl Electronic Signature(s) Signed: 05/29/2023 5:02:38 PM By: Thayer Dallas Entered By: Thayer Dallas on 05/28/2023 01:02:72

## 2023-06-04 ENCOUNTER — Ambulatory Visit (HOSPITAL_BASED_OUTPATIENT_CLINIC_OR_DEPARTMENT_OTHER): Payer: No Typology Code available for payment source | Admitting: Internal Medicine

## 2023-06-11 ENCOUNTER — Encounter (HOSPITAL_BASED_OUTPATIENT_CLINIC_OR_DEPARTMENT_OTHER): Payer: No Typology Code available for payment source | Attending: Physician Assistant | Admitting: Physician Assistant

## 2023-06-11 DIAGNOSIS — Z89429 Acquired absence of other toe(s), unspecified side: Secondary | ICD-10-CM | POA: Insufficient documentation

## 2023-06-11 DIAGNOSIS — E11621 Type 2 diabetes mellitus with foot ulcer: Secondary | ICD-10-CM | POA: Insufficient documentation

## 2023-06-11 DIAGNOSIS — E11622 Type 2 diabetes mellitus with other skin ulcer: Secondary | ICD-10-CM | POA: Insufficient documentation

## 2023-06-11 DIAGNOSIS — L97412 Non-pressure chronic ulcer of right heel and midfoot with fat layer exposed: Secondary | ICD-10-CM | POA: Insufficient documentation

## 2023-06-11 DIAGNOSIS — I890 Lymphedema, not elsewhere classified: Secondary | ICD-10-CM | POA: Insufficient documentation

## 2023-06-11 DIAGNOSIS — I10 Essential (primary) hypertension: Secondary | ICD-10-CM | POA: Insufficient documentation

## 2023-06-11 DIAGNOSIS — L97812 Non-pressure chronic ulcer of other part of right lower leg with fat layer exposed: Secondary | ICD-10-CM | POA: Insufficient documentation

## 2023-06-11 DIAGNOSIS — E1151 Type 2 diabetes mellitus with diabetic peripheral angiopathy without gangrene: Secondary | ICD-10-CM | POA: Diagnosis not present

## 2023-06-11 DIAGNOSIS — M86671 Other chronic osteomyelitis, right ankle and foot: Secondary | ICD-10-CM | POA: Insufficient documentation

## 2023-06-11 DIAGNOSIS — M722 Plantar fascial fibromatosis: Secondary | ICD-10-CM | POA: Insufficient documentation

## 2023-06-11 NOTE — Progress Notes (Signed)
Baltzell, [name] ([id number]) [id number] Page 1 of 2 Visit Report for 06/11/2023 Chief Complaint Document Details Patient Name: Date of Service: [name], [name] 06/11/2023 8:45 A M Medical Record Number: [id number] Patient Account Number: [id number] Date of Birth/Sex: Treating RN: 1966-09-15 (57 y.o. F) Primary Care Provider: [name] Other Clinician: Referring Provider: Treating Provider/Extender: [name] in Treatment: 28 Information Obtained from: Patient Chief Complaint Right heel ulcer Electronic Signature(s) Signed: 06/11/2023 8:59:57 AM By: Allen Derry PA-C Entered By: Allen Derry on 06/11/2023 05:59:57 -------------------------------------------------------------------------------- Problem List Details Patient Name: Date of Service: [name], [name] 06/11/2023 8:45 A M Medical Record Number: [id number] Patient Account Number: [id number] Date of Birth/Sex: Treating RN: February 04, 1966 (57 y.o. 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

## 2023-06-12 NOTE — Progress Notes (Signed)
Soria, [name] ([id number]) [id number] Page 1 of 6 Visit Report for 06/11/2023 Arrival Information Details Patient Name: Date of Service: [name], [name] 06/11/2023 8:45 A M Medical Record Number: [id number] Patient Account Number: [id number] Date of Birth/Sex: Treating RN: [birth year] (57 y.o. [name] Primary Care Acelin Ferdig: [name] Other Clinician: Referring Tinia Oravec: Treating Paula Zietz/Extender: [name] in Treatment: 45 Visit Information History Since Last Visit Added or deleted any medications: No Patient Arrived: Ambulatory Any new allergies or adverse reactions: No Arrival Time: 09:00 Had a fall or experienced change in No Accompanied By: self activities of daily living that may affect Transfer Assistance: None risk of falls: Patient Identification Verified: Yes Signs or symptoms of abuse/neglect since last visito No Secondary Verification Process Completed: Yes Hospitalized since last visit: No Patient Requires Transmission-Based Precautions: No Implantable device outside of the clinic excluding No Patient Has Alerts: Yes cellular tissue based products placed in the center Patient Alerts: PICC in left arm since last visit: Has Dressing in Place as Prescribed: Yes Has Footwear/Offloading in Place as Prescribed: No Right: T Contact Cast otal Pain Present Now: No Notes per patient sick last week- missed appt. no cast currently on. Electronic Signature(s) Signed: 06/11/2023 6:09:26 PM By: Shawn Stall RN, BSN Entered By: Shawn Stall on 06/11/2023 06:01:39 -------------------------------------------------------------------------------- Encounter Discharge Information Details Patient Name: Date of Service: [name], [name] 06/11/2023 8:45 A M Medical Record Number: [id number] Patient Account Number: [id number] Date of Birth/Sex: Treating RN: [birth year] (57 y.o. [name] Primary Care Cayde Held: [name] Other  Clinician: Referring Johnae Friley: Treating Blanch Stang/Extender: [name] in Treatment: 89 Encounter Discharge Information Items Post Procedure Vitals Discharge Condition: Stable Temperature (F): 98.2 Ambulatory Status: Ambulatory Pulse (bpm): 95 Discharge Destination: Home Respiratory Rate (breaths/min): 16 Transportation: Private Auto Blood Pressure (mmHg): 123/84 Accompanied By: son Schedule Follow-up Appointment: Yes Clinical Summary of Care: Electronic Signature(s) Signed: 06/11/2023 6:09:26 PM By: Shawn Stall RN, BSN Entered By: Shawn Stall on 06/11/2023 06:38:36 Koelling, Tiyana ([id number]) [id number] Page 2 of 6 -------------------------------------------------------------------------------- Lower Extremity Assessment Details Patient Name: Date of Service: [name], [name] 06/11/2023 8:45 A M Medical Record Number: [id number] Patient Account Number: [id number] Date of Birth/Sex: Treating RN: 11/17/1965 (57 y.o. [name], Millard.Loa Primary Care Gal Feldhaus: [name] Other Clinician: Referring Geniyah Eischeid: Treating Rivers Hamrick/Extender: [name] in Treatment: 52 Edema Assessment Assessed: [Left: Yes] Franne Forts: Yes] Edema: [Left: Yes] [Right: Yes] Calf Left: Right: Point of Measurement: 32 cm From Medial Instep 55 cm 55 cm Ankle Left: Right: Point of Measurement: 10 cm From Medial Instep 32 cm 32 cm Knee To Floor Left: Right: From Medial Instep 42 cm 42 cm Vascular Assessment Pulses: Dorsalis Pedis Palpable: [Right:Yes] Extremity colors, hair growth, and conditions: Extremity Color: [Right:Normal] Hair Growth on Extremity: [Right:No] Temperature of Extremity: [Right:Warm] Capillary Refill: [Right:< 3 seconds] Dependent Rubor: [Right:No] Blanched when Elevated: [Right:No Yes] Toe Nail Assessment Left: Right: Thick: No Discolored: No Deformed: No Improper Length and Hygiene: No Notes redness noted  to right lower leg circumiferental. Electronic Signature(s) Signed: 06/11/2023 6:09:26 PM By: Shawn Stall RN, BSN Entered By: Shawn Stall on 06/11/2023 06:44:09 -------------------------------------------------------------------------------- Multi-Disciplinary Care Plan Details Patient Name: Date of Service: [name], [name] 06/11/2023 8:45 A M Medical Record Number: [id number] Patient Account Number: [id number] Date of Birth/Sex: Treating RN: 06-11-1966 (57 y.o. [name], Bobbi Little Silver, Kashawn ([id number]) [id number] Page 3 of 6 Primary Care Yalissa Fink: [name] Other Clinician: Referring Lynore Coscia: Treating Niley Helbig/Extender: Collie Siad, Ellin Mayhew  in Treatment: 52 Active Inactive Wound/Skin Impairment Nursing Diagnoses: Impaired tissue integrity Knowledge deficit related to ulceration/compromised skin integrity Goals: Patient will have a decrease in wound volume by X% from date: (specify in notes) Date Initiated: 06/12/2022 Target Resolution Date: 08/07/2023 Goal Status: Active Patient/caregiver will verbalize understanding of skin care regimen Date Initiated: 06/12/2022 Target Resolution Date: 08/07/2023 Goal Status: Active Ulcer/skin breakdown will have a volume reduction of 30% by week 4 Date Initiated: 06/12/2022 Date Inactivated: 07/31/2022 Target Resolution Date: 08/03/2022 Goal Status: Unmet Unmet Reason: dx with osteomyelitis. Ulcer/skin breakdown will have a volume reduction of 50% by week 8 Date Initiated: 06/12/2022 Date Inactivated: 10/16/2022 Target Resolution Date: 10/05/2022 Unmet Reason: see wound Goal Status: Unmet measurement. IV antibiotics Interventions: Assess patient/caregiver ability to obtain necessary supplies Assess patient/caregiver ability to perform ulcer/skin care regimen upon admission and as needed Assess ulceration(s) every visit Notes: Electronic Signature(s) Signed: 06/11/2023 6:09:26 PM By: Shawn Stall RN,  BSN Entered By: Shawn Stall on 06/11/2023 06:10:39 -------------------------------------------------------------------------------- Pain Assessment Details Patient Name: Date of Service: [name], [name] 06/11/2023 8:45 A M Medical Record Number: [id number] Patient Account Number: [id number] Date of Birth/Sex: Treating RN: 13-Jun-1966 (57 y.o. [name] Primary Care Naomee Nowland: [name] Other Clinician: Referring River Mckercher: Treating Navreet Bolda/Extender: [name] in Treatment: 1 Active Problems Location of Pain Severity and Description of Pain Patient Has Paino No Site Locations [place], Lizbeth ([id number]) [phone].pdf Page 4 of 6 Pain Management and Medication Current Pain Management: Electronic Signature(s) Signed: 06/11/2023 6:09:26 PM By: Shawn Stall RN, BSN Entered By: Shawn Stall on 06/11/2023 06:01:46 -------------------------------------------------------------------------------- Patient/Caregiver Education Details Patient Name: Date of Service: Darral Dash 9/4/2024andnbsp8:45 A M Medical Record Number: [id number] Patient Account Number: [id number] Date of Birth/Gender: Treating RN: 12/14/1965 (58 y.o. [name] Primary Care Physician: [name] Other Clinician: Referring Physician: Treating Physician/Extender: [name] in Treatment: 59 Education Assessment Education Provided To: Patient Education Topics Provided Wound/Skin Impairment: Handouts: Caring for Your Ulcer Methods: Explain/Verbal Responses: Reinforcements needed Electronic Signature(s) Signed: 06/11/2023 6:09:26 PM By: Shawn Stall RN, BSN Entered By: Shawn Stall on 06/11/2023 06:10:51 -------------------------------------------------------------------------------- Wound Assessment Details Patient Name: Date of Service: CHARVI, KIMBLEY 06/11/2023 8:45 A M Medical Record Number: [id number] Patient Account Number:  [id number] Date of Birth/Sex: Treating RN: 10/26/1965 (57 y.o. [name] Primary Care Racquel Arkin: [name] Other Clinician: Darral Dash ([id number]) [id number] Page 5 of 6 Referring Karyssa Amaral: Treating Diontay Rosencrans/Extender: [name] in Treatment: 52 Wound Status Wound Number: 1 Primary Etiology: Diabetic Wound/Ulcer of the Lower Extremity Wound Location: Right Calcaneus Wound Status: Open Wounding Event: Gradually Appeared Comorbid History: Hypertension, Type II Diabetes, Osteomyelitis Date Acquired: 11/07/2020 Weeks Of Treatment: 52 Clustered Wound: Yes Photos Wound Measurements Length: (cm) Width: (cm) Depth: (cm) Clustered Quantity: Area: (cm) Volume: (cm) 0.1 % Reduction in Area: 99.9% 0.1 % Reduction in Volume: 99.9% 0.2 Epithelialization: Large (67-100%) 1 Tunneling: No 0.008 Undermining: No 0.002 Wound Description Classification: Grade 3 Wound Margin: Distinct, outline attached Exudate Amount: Medium Exudate Type: Serosanguineous Exudate Color: red, brown Foul Odor After Cleansing: No Slough/Fibrino No Wound Bed Granulation Amount: Large (67-100%) Exposed Structure Granulation Quality: Red Fascia Exposed: No Necrotic Amount: None Present (0%) Fat Layer (Subcutaneous Tissue) Exposed: Yes Tendon Exposed: No Muscle Exposed: No Joint Exposed: No Bone Exposed: No Periwound Skin Texture Texture Color No Abnormalities Noted: No No Abnormalities Noted: No Callus: Yes Atrophie Blanche: No Crepitus: No Cyanosis: No Excoriation: No Ecchymosis: No Induration: No Erythema:  No Rash: No Hemosiderin Staining: No Scarring: No Mottled: No Pallor: No Moisture Rubor: No No Abnormalities Noted: No Dry / Scaly: No Temperature / Pain Maceration: No Temperature: No Abnormality Tenderness on Palpation: Yes Treatment Notes Wound #1 (Calcaneus) Wound Laterality: Right Cleanser Soap and Water Discharge  Instruction: May shower and wash wound with dial antibacterial soap and water prior to dressing change. Wound Cleanser Discharge Instruction: Cleanse the wound with wound cleanser prior to applying a clean dressing using gauze sponges, not tissue or cotton Tuazon, Ariyon ([id number]) [id number] Page 6 of 6 balls. Peri-Wound Care Sween Lotion (Moisturizing lotion) Discharge Instruction: Apply moisturizing lotion as directed Topical Primary Dressing Promogran Prisma Matrix, 4.34 (sq in) (silver collagen) Discharge Instruction: Moisten collagen with saline or hydrogel Secondary Dressing Woven Gauze Sponge, Non-Sterile 4x4 in Discharge Instruction: Apply over primary dressing as directed. Zetuvit Plus 4x8 in Discharge Instruction: Apply over primary dressing as directed. Secured With [employer], 4.5x3.1 (in/yd) Discharge Instruction: Secure with Kerlix as directed. 56M Medipore H Soft Cloth Surgical T ape, 4 x 10 (in/yd) Discharge Instruction: Secure with tape as directed. Compression Wrap Compression Stockings Add-Ons Electronic Signature(s) Signed: 06/11/2023 6:09:26 PM By: Shawn Stall RN, BSN Entered By: Shawn Stall on 06/11/2023 06:07:16 -------------------------------------------------------------------------------- Vitals Details Patient Name: Date of Service: LECHIA, DELATOUR 06/11/2023 8:45 A M Medical Record Number: [id number] Patient Account Number: [id number] Date of Birth/Sex: Treating RN: 17-Oct-1965 (58 y.o. [name], Millard.Loa Primary Care Kilah Drahos: [name] Other Clinician: Referring Cavan Bearden: Treating Amulya Quintin/Extender: [name] in Treatment: 8 Vital Signs Time Taken: 09:08 Temperature (F): 98.2 Weight (lbs): 331 Pulse (bpm): 95 Respiratory Rate (breaths/min): 16 Blood Pressure (mmHg): 123/84 Reference Range: 80 - 120 mg / dl Electronic Signature(s) Signed: 06/11/2023 6:09:26 PM By: Shawn Stall RN,  BSN Entered By: Shawn Stall on 06/11/2023 06:09:12

## 2023-06-18 ENCOUNTER — Ambulatory Visit (HOSPITAL_BASED_OUTPATIENT_CLINIC_OR_DEPARTMENT_OTHER): Payer: No Typology Code available for payment source | Admitting: Physician Assistant

## 2023-06-25 ENCOUNTER — Encounter (HOSPITAL_BASED_OUTPATIENT_CLINIC_OR_DEPARTMENT_OTHER): Payer: No Typology Code available for payment source | Admitting: Physician Assistant

## 2023-06-25 DIAGNOSIS — E11622 Type 2 diabetes mellitus with other skin ulcer: Secondary | ICD-10-CM | POA: Diagnosis not present

## 2023-06-25 NOTE — Progress Notes (Addendum)
any signs of active infection locally or systemically which is great news. No fevers, chills, nausea, vomiting, or diarrhea. Electronic Signature(s) Signed: 06/25/2023 9:34:37 AM By: Allen Derry PA-C Entered By: Allen Derry on 06/25/2023 06:34:37 [name], [name] ([id number]) [id number] Page 4 of 9 -------------------------------------------------------------------------------- Physical Exam Details Patient Name: Date of Service: [name], [name] 06/25/2023 8:45 A M Medical Record Number: [id number] Patient Account Number: [id number] Date of Birth/Sex: Treating RN: [birth year] (57 y.o. F) Primary Care Provider: [name] Other Clinician: Referring Provider: Treating Provider/Extender: [name] in Treatment: 1 Constitutional Chronically ill appearing but in no apparent acute distress. Respiratory normal breathing without difficulty. Psychiatric this patient is able to make decisions and demonstrates good insight into disease process. Alert and Oriented x 3. pleasant and cooperative. Notes Upon inspection patient's wound bed actually showed signs of good granulation epithelization at this point. Fortunately I do not see any signs of worsening overall and I do believe that the patient is making excellent headway towards complete closure. Electronic  Signature(s) Signed: 06/25/2023 9:34:52 AM By: Allen Derry PA-C Entered By: Allen Derry on 06/25/2023 06:34:52 -------------------------------------------------------------------------------- Physician Orders Details Patient Name: Date of Service: [name], [name] 06/25/2023 8:45 A M Medical Record Number: [id number] Patient Account Number: [id number] Date of Birth/Sex: Treating RN: Dec 23, 1965 (57 y.o. 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 Your Wound is healed! Electronic Signature(s) Signed: 06/25/2023 1:23:44 PM By: Karie Schwalbe RN Signed: 06/25/2023 3:48:11 PM By: Allen Derry PA-C Entered By: Karie Schwalbe on 06/25/2023 06:32:43 [name], [name] ([id number]) [id number] Page 5 of 9 -------------------------------------------------------------------------------- Problem List Details Patient Name: Date of Service: [name], [name] 06/25/2023 8:45 A M Medical Record Number: [id number] Patient Account Number: [id number] Date of Birth/Sex: Treating RN: 01/24/1966 (57 y.o. F) Primary Care Provider: [name] Other Clinician: Referring Provider: Treating Provider/Extender: [name] in Treatment: 64 Active Problems ICD-10 Encounter Code Description Active Date MDM Diagnosis L97.412 Non-pressure chronic ulcer of right heel and  midfoot with fat layer exposed 06/12/2022 No Yes M86.671 Other chronic osteomyelitis, right ankle and foot 08/28/2022 No Yes E11.621 Type 2 diabetes mellitus with foot ulcer 06/12/2022 No Yes I89.0 Lymphedema, not elsewhere classified 06/12/2022 No Yes L97.812 Non-pressure chronic ulcer of other part of right lower leg with fat layer 03/05/2023 No Yes exposed Inactive Problems Resolved Problems Electronic Signature(s) Signed: 06/25/2023 9:26:19 AM By: Allen Derry PA-C Entered By: Allen Derry on 06/25/2023 06:26:18 -------------------------------------------------------------------------------- Progress Note Details Patient Name: Date of Service: [name], [name] 06/25/2023 8:45 A M Medical Record Number: [id number] Patient Account Number: [id number] Date of Birth/Sex: Treating RN: 1966/04/18 (57 y.o. F) Primary Care Provider: [name] Other Clinician: Referring Provider: Treating Provider/Extender: [name] in Treatment: 21 Subjective Chief Complaint Information obtained from Patient Right heel ulcer History of Present Illness (HPI) [name], [name] ([id number]) [id number] Page 6 of 9 06-13-2022 upon evaluation today patient presents for evaluation of her right heel ulcer. She is having a tremendous amount of pain at this  any signs of active infection locally or systemically which is great news. No fevers, chills, nausea, vomiting, or diarrhea. Electronic Signature(s) Signed: 06/25/2023 9:34:37 AM By: Allen Derry PA-C Entered By: Allen Derry on 06/25/2023 06:34:37 [name], [name] ([id number]) [id number] Page 4 of 9 -------------------------------------------------------------------------------- Physical Exam Details Patient Name: Date of Service: [name], [name] 06/25/2023 8:45 A M Medical Record Number: [id number] Patient Account Number: [id number] Date of Birth/Sex: Treating RN: [birth year] (57 y.o. F) Primary Care Provider: [name] Other Clinician: Referring Provider: Treating Provider/Extender: [name] in Treatment: 1 Constitutional Chronically ill appearing but in no apparent acute distress. Respiratory normal breathing without difficulty. Psychiatric this patient is able to make decisions and demonstrates good insight into disease process. Alert and Oriented x 3. pleasant and cooperative. Notes Upon inspection patient's wound bed actually showed signs of good granulation epithelization at this point. Fortunately I do not see any signs of worsening overall and I do believe that the patient is making excellent headway towards complete closure. Electronic  Signature(s) Signed: 06/25/2023 9:34:52 AM By: Allen Derry PA-C Entered By: Allen Derry on 06/25/2023 06:34:52 -------------------------------------------------------------------------------- Physician Orders Details Patient Name: Date of Service: [name], [name] 06/25/2023 8:45 A M Medical Record Number: [id number] Patient Account Number: [id number] Date of Birth/Sex: Treating RN: Dec 23, 1965 (57 y.o. 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 Your Wound is healed! Electronic Signature(s) Signed: 06/25/2023 1:23:44 PM By: Karie Schwalbe RN Signed: 06/25/2023 3:48:11 PM By: Allen Derry PA-C Entered By: Karie Schwalbe on 06/25/2023 06:32:43 [name], [name] ([id number]) [id number] Page 5 of 9 -------------------------------------------------------------------------------- Problem List Details Patient Name: Date of Service: [name], [name] 06/25/2023 8:45 A M Medical Record Number: [id number] Patient Account Number: [id number] Date of Birth/Sex: Treating RN: 01/24/1966 (57 y.o. F) Primary Care Provider: [name] Other Clinician: Referring Provider: Treating Provider/Extender: [name] in Treatment: 64 Active Problems ICD-10 Encounter Code Description Active Date MDM Diagnosis L97.412 Non-pressure chronic ulcer of right heel and  midfoot with fat layer exposed 06/12/2022 No Yes M86.671 Other chronic osteomyelitis, right ankle and foot 08/28/2022 No Yes E11.621 Type 2 diabetes mellitus with foot ulcer 06/12/2022 No Yes I89.0 Lymphedema, not elsewhere classified 06/12/2022 No Yes L97.812 Non-pressure chronic ulcer of other part of right lower leg with fat layer 03/05/2023 No Yes exposed Inactive Problems Resolved Problems Electronic Signature(s) Signed: 06/25/2023 9:26:19 AM By: Allen Derry PA-C Entered By: Allen Derry on 06/25/2023 06:26:18 -------------------------------------------------------------------------------- Progress Note Details Patient Name: Date of Service: [name], [name] 06/25/2023 8:45 A M Medical Record Number: [id number] Patient Account Number: [id number] Date of Birth/Sex: Treating RN: 1966/04/18 (57 y.o. F) Primary Care Provider: [name] Other Clinician: Referring Provider: Treating Provider/Extender: [name] in Treatment: 21 Subjective Chief Complaint Information obtained from Patient Right heel ulcer History of Present Illness (HPI) [name], [name] ([id number]) [id number] Page 6 of 9 06-13-2022 upon evaluation today patient presents for evaluation of her right heel ulcer. She is having a tremendous amount of pain at this  0cm^2 area and 0cm^3 volume. There is no tunneling or undermining noted. There is a none present amount of drainage noted. The wound margin is distinct with the outline attached to  the wound base. There is no granulation within the wound bed. There is no necrotic tissue within the wound bed. The periwound skin appearance had no abnormalities noted for moisture. The periwound skin appearance did not exhibit: Callus, Crepitus, Excoriation, Induration, Rash, Scarring, Atrophie Blanche, Cyanosis, Ecchymosis, Hemosiderin Staining, Mottled, Pallor, Rubor, Erythema. Periwound temperature was noted as No Abnormality. The periwound has tenderness on palpation. Assessment Active Problems ICD-10 Non-pressure chronic ulcer of right heel and midfoot with fat layer exposed Other chronic osteomyelitis, right ankle and foot Type 2 diabetes mellitus with foot ulcer Lymphedema, not elsewhere classified Non-pressure chronic ulcer of other part of right lower leg with fat layer exposed Plan Discharge From [facility] Services: Discharge from Wound Care Center - Congratulations! Your Wound is healed! 1. On I am going to recommend that we going discontinue wound care services as the patient appears to be completely healed and she is in agreement with [name], [name] ([id number]) [phone].pdf Page 9 of 9 that plan. 2. I am also can recommend the patient should continue to baby and protect the heel. She should still use her scooter at work and I think that this will keep things moving in the right direction she voiced understanding and she is in agreement with that plan. We will see her back for a follow-up visit as needed. Electronic Signature(s) Signed: 06/25/2023 9:35:22 AM By: Allen Derry PA-C Entered By: Allen Derry on 06/25/2023 06:35:21 -------------------------------------------------------------------------------- SuperBill Details Patient Name: Date of Service: [name], [name] 06/25/2023 Medical Record Number: [id number] Patient Account Number: [id number] Date of Birth/Sex: Treating RN: 07/23/1966 (57 y.o. F) Primary Care Provider: [name] Other  Clinician: Referring Provider: Treating Provider/Extender: [name] in Treatment: 74 Diagnosis Coding ICD-10 Codes Code Description [id number] Non-pressure chronic ulcer of right heel and midfoot with fat layer exposed M86.671 Other chronic osteomyelitis, right ankle and foot E11.621 Type 2 diabetes mellitus with foot ulcer I89.0 Lymphedema, not elsewhere classified L97.812 Non-pressure chronic ulcer of other part of right lower leg with fat layer exposed Facility Procedures : CPT4 Code: 62703500 Description: 99213 - WOUND CARE VISIT-LEV 3 EST PT Modifier: Quantity: 1 Physician Procedures : CPT4 Code Description Modifier [id number] 99213 - WC PHYS LEVEL 3 - EST PT ICD-10 Diagnosis Description 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 There is no tunneling or undermining noted. There is a none present amount of drainage noted. The wound margin is distinct with the outline attached to  the wound base. There is no granulation within the wound bed. There is no necrotic tissue within the wound bed. The periwound skin appearance had no abnormalities noted for moisture. The periwound skin appearance did not exhibit: Callus, Crepitus, Excoriation, Induration, Rash, Scarring, Atrophie Blanche, Cyanosis, Ecchymosis, Hemosiderin Staining, Mottled, Pallor, Rubor, Erythema. Periwound temperature was noted as No Abnormality. The periwound has tenderness on palpation. Assessment Active Problems ICD-10 Non-pressure chronic ulcer of right heel and midfoot with fat layer exposed Other chronic osteomyelitis, right ankle and foot Type 2 diabetes mellitus with foot ulcer Lymphedema, not elsewhere classified Non-pressure chronic ulcer of other part of right lower leg with fat layer exposed Plan Discharge From [facility] Services: Discharge from Wound Care Center - Congratulations! Your Wound is healed! 1. On I am going to recommend that we going discontinue wound care services as the patient appears to be completely healed and she is in agreement with [name], [name] ([id number]) [phone].pdf Page 9 of 9 that plan. 2. I am also can recommend the patient should continue to baby and protect the heel. She should still use her scooter at work and I think that this will keep things moving in the right direction she voiced understanding and she is in agreement with that plan. We will see her back for a follow-up visit as needed. Electronic Signature(s) Signed: 06/25/2023 9:35:22 AM By: Allen Derry PA-C Entered By: Allen Derry on 06/25/2023 06:35:21 -------------------------------------------------------------------------------- SuperBill Details Patient Name: Date of Service: [name], [name] 06/25/2023 Medical Record Number: [id number] Patient Account Number: [id number] Date of Birth/Sex: Treating RN: 07/23/1966 (57 y.o. F) Primary Care Provider: [name] Other  Clinician: Referring Provider: Treating Provider/Extender: [name] in Treatment: 74 Diagnosis Coding ICD-10 Codes Code Description [id number] Non-pressure chronic ulcer of right heel and midfoot with fat layer exposed M86.671 Other chronic osteomyelitis, right ankle and foot E11.621 Type 2 diabetes mellitus with foot ulcer I89.0 Lymphedema, not elsewhere classified L97.812 Non-pressure chronic ulcer of other part of right lower leg with fat layer exposed Facility Procedures : CPT4 Code: 62703500 Description: 99213 - WOUND CARE VISIT-LEV 3 EST PT Modifier: Quantity: 1 Physician Procedures : CPT4 Code Description Modifier [id number] 99213 - WC PHYS LEVEL 3 - EST PT ICD-10 Diagnosis Description 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 No fevers, chills, nausea, vomiting, or diarrhea. Electronic Signature(s) Signed: 06/25/2023 9:34:37 AM By: Allen Derry PA-C Entered By: Allen Derry on 06/25/2023 06:34:37 [name], [name] ([id number]) [id number] Page 4 of 9 -------------------------------------------------------------------------------- Physical Exam Details Patient Name: Date of Service: [name], [name] 06/25/2023 8:45 A M Medical Record Number: [id number] Patient Account Number: [id number] Date of Birth/Sex: Treating RN: [birth year] (57 y.o. F) Primary Care Provider: [name] Other Clinician: Referring Provider: Treating Provider/Extender: [name] in Treatment: 1 Constitutional Chronically ill appearing but in no apparent acute distress. Respiratory normal breathing without difficulty. Psychiatric this patient is able to make decisions and demonstrates good insight into disease process. Alert and Oriented x 3. pleasant and cooperative. Notes Upon inspection patient's wound bed actually showed signs of good granulation epithelization at this point. Fortunately I do not see any signs of worsening overall and I do believe that the patient is making excellent headway towards complete closure. Electronic  Signature(s) Signed: 06/25/2023 9:34:52 AM By: Allen Derry PA-C Entered By: Allen Derry on 06/25/2023 06:34:52 -------------------------------------------------------------------------------- Physician Orders Details Patient Name: Date of Service: [name], [name] 06/25/2023 8:45 A M Medical Record Number: [id number] Patient Account Number: [id number] Date of Birth/Sex: Treating RN: Dec 23, 1965 (57 y.o. 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 Your Wound is healed! Electronic Signature(s) Signed: 06/25/2023 1:23:44 PM By: Karie Schwalbe RN Signed: 06/25/2023 3:48:11 PM By: Allen Derry PA-C Entered By: Karie Schwalbe on 06/25/2023 06:32:43 [name], [name] ([id number]) [id number] Page 5 of 9 -------------------------------------------------------------------------------- Problem List Details Patient Name: Date of Service: [name], [name] 06/25/2023 8:45 A M Medical Record Number: [id number] Patient Account Number: [id number] Date of Birth/Sex: Treating RN: 01/24/1966 (57 y.o. F) Primary Care Provider: [name] Other Clinician: Referring Provider: Treating Provider/Extender: [name] in Treatment: 64 Active Problems ICD-10 Encounter Code Description Active Date MDM Diagnosis L97.412 Non-pressure chronic ulcer of right heel and  midfoot with fat layer exposed 06/12/2022 No Yes M86.671 Other chronic osteomyelitis, right ankle and foot 08/28/2022 No Yes E11.621 Type 2 diabetes mellitus with foot ulcer 06/12/2022 No Yes I89.0 Lymphedema, not elsewhere classified 06/12/2022 No Yes L97.812 Non-pressure chronic ulcer of other part of right lower leg with fat layer 03/05/2023 No Yes exposed Inactive Problems Resolved Problems Electronic Signature(s) Signed: 06/25/2023 9:26:19 AM By: Allen Derry PA-C Entered By: Allen Derry on 06/25/2023 06:26:18 -------------------------------------------------------------------------------- Progress Note Details Patient Name: Date of Service: [name], [name] 06/25/2023 8:45 A M Medical Record Number: [id number] Patient Account Number: [id number] Date of Birth/Sex: Treating RN: 1966/04/18 (57 y.o. F) Primary Care Provider: [name] Other Clinician: Referring Provider: Treating Provider/Extender: [name] in Treatment: 21 Subjective Chief Complaint Information obtained from Patient Right heel ulcer History of Present Illness (HPI) [name], [name] ([id number]) [id number] Page 6 of 9 06-13-2022 upon evaluation today patient presents for evaluation of her right heel ulcer. She is having a tremendous amount of pain at this  0cm^2 area and 0cm^3 volume. There is no tunneling or undermining noted. There is a none present amount of drainage noted. The wound margin is distinct with the outline attached to  the wound base. There is no granulation within the wound bed. There is no necrotic tissue within the wound bed. The periwound skin appearance had no abnormalities noted for moisture. The periwound skin appearance did not exhibit: Callus, Crepitus, Excoriation, Induration, Rash, Scarring, Atrophie Blanche, Cyanosis, Ecchymosis, Hemosiderin Staining, Mottled, Pallor, Rubor, Erythema. Periwound temperature was noted as No Abnormality. The periwound has tenderness on palpation. Assessment Active Problems ICD-10 Non-pressure chronic ulcer of right heel and midfoot with fat layer exposed Other chronic osteomyelitis, right ankle and foot Type 2 diabetes mellitus with foot ulcer Lymphedema, not elsewhere classified Non-pressure chronic ulcer of other part of right lower leg with fat layer exposed Plan Discharge From [facility] Services: Discharge from Wound Care Center - Congratulations! Your Wound is healed! 1. On I am going to recommend that we going discontinue wound care services as the patient appears to be completely healed and she is in agreement with [name], [name] ([id number]) [phone].pdf Page 9 of 9 that plan. 2. I am also can recommend the patient should continue to baby and protect the heel. She should still use her scooter at work and I think that this will keep things moving in the right direction she voiced understanding and she is in agreement with that plan. We will see her back for a follow-up visit as needed. Electronic Signature(s) Signed: 06/25/2023 9:35:22 AM By: Allen Derry PA-C Entered By: Allen Derry on 06/25/2023 06:35:21 -------------------------------------------------------------------------------- SuperBill Details Patient Name: Date of Service: [name], [name] 06/25/2023 Medical Record Number: [id number] Patient Account Number: [id number] Date of Birth/Sex: Treating RN: 07/23/1966 (57 y.o. F) Primary Care Provider: [name] Other  Clinician: Referring Provider: Treating Provider/Extender: [name] in Treatment: 74 Diagnosis Coding ICD-10 Codes Code Description [id number] Non-pressure chronic ulcer of right heel and midfoot with fat layer exposed M86.671 Other chronic osteomyelitis, right ankle and foot E11.621 Type 2 diabetes mellitus with foot ulcer I89.0 Lymphedema, not elsewhere classified L97.812 Non-pressure chronic ulcer of other part of right lower leg with fat layer exposed Facility Procedures : CPT4 Code: 62703500 Description: 99213 - WOUND CARE VISIT-LEV 3 EST PT Modifier: Quantity: 1 Physician Procedures : CPT4 Code Description Modifier [id number] 99213 - WC PHYS LEVEL 3 - EST PT ICD-10 Diagnosis Description 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 No fevers, chills, nausea, vomiting, or diarrhea. Electronic Signature(s) Signed: 06/25/2023 9:34:37 AM By: Allen Derry PA-C Entered By: Allen Derry on 06/25/2023 06:34:37 [name], [name] ([id number]) [id number] Page 4 of 9 -------------------------------------------------------------------------------- Physical Exam Details Patient Name: Date of Service: [name], [name] 06/25/2023 8:45 A M Medical Record Number: [id number] Patient Account Number: [id number] Date of Birth/Sex: Treating RN: [birth year] (57 y.o. F) Primary Care Provider: [name] Other Clinician: Referring Provider: Treating Provider/Extender: [name] in Treatment: 1 Constitutional Chronically ill appearing but in no apparent acute distress. Respiratory normal breathing without difficulty. Psychiatric this patient is able to make decisions and demonstrates good insight into disease process. Alert and Oriented x 3. pleasant and cooperative. Notes Upon inspection patient's wound bed actually showed signs of good granulation epithelization at this point. Fortunately I do not see any signs of worsening overall and I do believe that the patient is making excellent headway towards complete closure. Electronic  Signature(s) Signed: 06/25/2023 9:34:52 AM By: Allen Derry PA-C Entered By: Allen Derry on 06/25/2023 06:34:52 -------------------------------------------------------------------------------- Physician Orders Details Patient Name: Date of Service: [name], [name] 06/25/2023 8:45 A M Medical Record Number: [id number] Patient Account Number: [id number] Date of Birth/Sex: Treating RN: Dec 23, 1965 (57 y.o. 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 Your Wound is healed! Electronic Signature(s) Signed: 06/25/2023 1:23:44 PM By: Karie Schwalbe RN Signed: 06/25/2023 3:48:11 PM By: Allen Derry PA-C Entered By: Karie Schwalbe on 06/25/2023 06:32:43 [name], [name] ([id number]) [id number] Page 5 of 9 -------------------------------------------------------------------------------- Problem List Details Patient Name: Date of Service: [name], [name] 06/25/2023 8:45 A M Medical Record Number: [id number] Patient Account Number: [id number] Date of Birth/Sex: Treating RN: 01/24/1966 (57 y.o. F) Primary Care Provider: [name] Other Clinician: Referring Provider: Treating Provider/Extender: [name] in Treatment: 64 Active Problems ICD-10 Encounter Code Description Active Date MDM Diagnosis L97.412 Non-pressure chronic ulcer of right heel and  midfoot with fat layer exposed 06/12/2022 No Yes M86.671 Other chronic osteomyelitis, right ankle and foot 08/28/2022 No Yes E11.621 Type 2 diabetes mellitus with foot ulcer 06/12/2022 No Yes I89.0 Lymphedema, not elsewhere classified 06/12/2022 No Yes L97.812 Non-pressure chronic ulcer of other part of right lower leg with fat layer 03/05/2023 No Yes exposed Inactive Problems Resolved Problems Electronic Signature(s) Signed: 06/25/2023 9:26:19 AM By: Allen Derry PA-C Entered By: Allen Derry on 06/25/2023 06:26:18 -------------------------------------------------------------------------------- Progress Note Details Patient Name: Date of Service: [name], [name] 06/25/2023 8:45 A M Medical Record Number: [id number] Patient Account Number: [id number] Date of Birth/Sex: Treating RN: 1966/04/18 (57 y.o. F) Primary Care Provider: [name] Other Clinician: Referring Provider: Treating Provider/Extender: [name] in Treatment: 21 Subjective Chief Complaint Information obtained from Patient Right heel ulcer History of Present Illness (HPI) [name], [name] ([id number]) [id number] Page 6 of 9 06-13-2022 upon evaluation today patient presents for evaluation of her right heel ulcer. She is having a tremendous amount of pain at this  any signs of active infection locally or systemically which is great news. No fevers, chills, nausea, vomiting, or diarrhea. Electronic Signature(s) Signed: 06/25/2023 9:34:37 AM By: Allen Derry PA-C Entered By: Allen Derry on 06/25/2023 06:34:37 [name], [name] ([id number]) [id number] Page 4 of 9 -------------------------------------------------------------------------------- Physical Exam Details Patient Name: Date of Service: [name], [name] 06/25/2023 8:45 A M Medical Record Number: [id number] Patient Account Number: [id number] Date of Birth/Sex: Treating RN: [birth year] (57 y.o. F) Primary Care Provider: [name] Other Clinician: Referring Provider: Treating Provider/Extender: [name] in Treatment: 1 Constitutional Chronically ill appearing but in no apparent acute distress. Respiratory normal breathing without difficulty. Psychiatric this patient is able to make decisions and demonstrates good insight into disease process. Alert and Oriented x 3. pleasant and cooperative. Notes Upon inspection patient's wound bed actually showed signs of good granulation epithelization at this point. Fortunately I do not see any signs of worsening overall and I do believe that the patient is making excellent headway towards complete closure. Electronic  Signature(s) Signed: 06/25/2023 9:34:52 AM By: Allen Derry PA-C Entered By: Allen Derry on 06/25/2023 06:34:52 -------------------------------------------------------------------------------- Physician Orders Details Patient Name: Date of Service: [name], [name] 06/25/2023 8:45 A M Medical Record Number: [id number] Patient Account Number: [id number] Date of Birth/Sex: Treating RN: Dec 23, 1965 (57 y.o. 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 Your Wound is healed! Electronic Signature(s) Signed: 06/25/2023 1:23:44 PM By: Karie Schwalbe RN Signed: 06/25/2023 3:48:11 PM By: Allen Derry PA-C Entered By: Karie Schwalbe on 06/25/2023 06:32:43 [name], [name] ([id number]) [id number] Page 5 of 9 -------------------------------------------------------------------------------- Problem List Details Patient Name: Date of Service: [name], [name] 06/25/2023 8:45 A M Medical Record Number: [id number] Patient Account Number: [id number] Date of Birth/Sex: Treating RN: 01/24/1966 (57 y.o. F) Primary Care Provider: [name] Other Clinician: Referring Provider: Treating Provider/Extender: [name] in Treatment: 64 Active Problems ICD-10 Encounter Code Description Active Date MDM Diagnosis L97.412 Non-pressure chronic ulcer of right heel and  midfoot with fat layer exposed 06/12/2022 No Yes M86.671 Other chronic osteomyelitis, right ankle and foot 08/28/2022 No Yes E11.621 Type 2 diabetes mellitus with foot ulcer 06/12/2022 No Yes I89.0 Lymphedema, not elsewhere classified 06/12/2022 No Yes L97.812 Non-pressure chronic ulcer of other part of right lower leg with fat layer 03/05/2023 No Yes exposed Inactive Problems Resolved Problems Electronic Signature(s) Signed: 06/25/2023 9:26:19 AM By: Allen Derry PA-C Entered By: Allen Derry on 06/25/2023 06:26:18 -------------------------------------------------------------------------------- Progress Note Details Patient Name: Date of Service: [name], [name] 06/25/2023 8:45 A M Medical Record Number: [id number] Patient Account Number: [id number] Date of Birth/Sex: Treating RN: 1966/04/18 (57 y.o. F) Primary Care Provider: [name] Other Clinician: Referring Provider: Treating Provider/Extender: [name] in Treatment: 21 Subjective Chief Complaint Information obtained from Patient Right heel ulcer History of Present Illness (HPI) [name], [name] ([id number]) [id number] Page 6 of 9 06-13-2022 upon evaluation today patient presents for evaluation of her right heel ulcer. She is having a tremendous amount of pain at this  0cm^2 area and 0cm^3 volume. There is no tunneling or undermining noted. There is a none present amount of drainage noted. The wound margin is distinct with the outline attached to  the wound base. There is no granulation within the wound bed. There is no necrotic tissue within the wound bed. The periwound skin appearance had no abnormalities noted for moisture. The periwound skin appearance did not exhibit: Callus, Crepitus, Excoriation, Induration, Rash, Scarring, Atrophie Blanche, Cyanosis, Ecchymosis, Hemosiderin Staining, Mottled, Pallor, Rubor, Erythema. Periwound temperature was noted as No Abnormality. The periwound has tenderness on palpation. Assessment Active Problems ICD-10 Non-pressure chronic ulcer of right heel and midfoot with fat layer exposed Other chronic osteomyelitis, right ankle and foot Type 2 diabetes mellitus with foot ulcer Lymphedema, not elsewhere classified Non-pressure chronic ulcer of other part of right lower leg with fat layer exposed Plan Discharge From [facility] Services: Discharge from Wound Care Center - Congratulations! Your Wound is healed! 1. On I am going to recommend that we going discontinue wound care services as the patient appears to be completely healed and she is in agreement with [name], [name] ([id number]) [phone].pdf Page 9 of 9 that plan. 2. I am also can recommend the patient should continue to baby and protect the heel. She should still use her scooter at work and I think that this will keep things moving in the right direction she voiced understanding and she is in agreement with that plan. We will see her back for a follow-up visit as needed. Electronic Signature(s) Signed: 06/25/2023 9:35:22 AM By: Allen Derry PA-C Entered By: Allen Derry on 06/25/2023 06:35:21 -------------------------------------------------------------------------------- SuperBill Details Patient Name: Date of Service: [name], [name] 06/25/2023 Medical Record Number: [id number] Patient Account Number: [id number] Date of Birth/Sex: Treating RN: 07/23/1966 (57 y.o. F) Primary Care Provider: [name] Other  Clinician: Referring Provider: Treating Provider/Extender: [name] in Treatment: 74 Diagnosis Coding ICD-10 Codes Code Description [id number] Non-pressure chronic ulcer of right heel and midfoot with fat layer exposed M86.671 Other chronic osteomyelitis, right ankle and foot E11.621 Type 2 diabetes mellitus with foot ulcer I89.0 Lymphedema, not elsewhere classified L97.812 Non-pressure chronic ulcer of other part of right lower leg with fat layer exposed Facility Procedures : CPT4 Code: 62703500 Description: 99213 - WOUND CARE VISIT-LEV 3 EST PT Modifier: Quantity: 1 Physician Procedures : CPT4 Code Description Modifier [id number] 99213 - WC PHYS LEVEL 3 - EST PT ICD-10 Diagnosis Description 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 There is no tunneling or undermining noted. There is a none present amount of drainage noted. The wound margin is distinct with the outline attached to  the wound base. There is no granulation within the wound bed. There is no necrotic tissue within the wound bed. The periwound skin appearance had no abnormalities noted for moisture. The periwound skin appearance did not exhibit: Callus, Crepitus, Excoriation, Induration, Rash, Scarring, Atrophie Blanche, Cyanosis, Ecchymosis, Hemosiderin Staining, Mottled, Pallor, Rubor, Erythema. Periwound temperature was noted as No Abnormality. The periwound has tenderness on palpation. Assessment Active Problems ICD-10 Non-pressure chronic ulcer of right heel and midfoot with fat layer exposed Other chronic osteomyelitis, right ankle and foot Type 2 diabetes mellitus with foot ulcer Lymphedema, not elsewhere classified Non-pressure chronic ulcer of other part of right lower leg with fat layer exposed Plan Discharge From [facility] Services: Discharge from Wound Care Center - Congratulations! Your Wound is healed! 1. On I am going to recommend that we going discontinue wound care services as the patient appears to be completely healed and she is in agreement with [name], [name] ([id number]) [phone].pdf Page 9 of 9 that plan. 2. I am also can recommend the patient should continue to baby and protect the heel. She should still use her scooter at work and I think that this will keep things moving in the right direction she voiced understanding and she is in agreement with that plan. We will see her back for a follow-up visit as needed. Electronic Signature(s) Signed: 06/25/2023 9:35:22 AM By: Allen Derry PA-C Entered By: Allen Derry on 06/25/2023 06:35:21 -------------------------------------------------------------------------------- SuperBill Details Patient Name: Date of Service: [name], [name] 06/25/2023 Medical Record Number: [id number] Patient Account Number: [id number] Date of Birth/Sex: Treating RN: 07/23/1966 (57 y.o. F) Primary Care Provider: [name] Other  Clinician: Referring Provider: Treating Provider/Extender: [name] in Treatment: 74 Diagnosis Coding ICD-10 Codes Code Description [id number] Non-pressure chronic ulcer of right heel and midfoot with fat layer exposed M86.671 Other chronic osteomyelitis, right ankle and foot E11.621 Type 2 diabetes mellitus with foot ulcer I89.0 Lymphedema, not elsewhere classified L97.812 Non-pressure chronic ulcer of other part of right lower leg with fat layer exposed Facility Procedures : CPT4 Code: 62703500 Description: 99213 - WOUND CARE VISIT-LEV 3 EST PT Modifier: Quantity: 1 Physician Procedures : CPT4 Code Description Modifier [id number] 99213 - WC PHYS LEVEL 3 - EST PT ICD-10 Diagnosis Description 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 No fevers, chills, nausea, vomiting, or diarrhea. Electronic Signature(s) Signed: 06/25/2023 9:34:37 AM By: Allen Derry PA-C Entered By: Allen Derry on 06/25/2023 06:34:37 [name], [name] ([id number]) [id number] Page 4 of 9 -------------------------------------------------------------------------------- Physical Exam Details Patient Name: Date of Service: [name], [name] 06/25/2023 8:45 A M Medical Record Number: [id number] Patient Account Number: [id number] Date of Birth/Sex: Treating RN: [birth year] (57 y.o. F) Primary Care Provider: [name] Other Clinician: Referring Provider: Treating Provider/Extender: [name] in Treatment: 1 Constitutional Chronically ill appearing but in no apparent acute distress. Respiratory normal breathing without difficulty. Psychiatric this patient is able to make decisions and demonstrates good insight into disease process. Alert and Oriented x 3. pleasant and cooperative. Notes Upon inspection patient's wound bed actually showed signs of good granulation epithelization at this point. Fortunately I do not see any signs of worsening overall and I do believe that the patient is making excellent headway towards complete closure. Electronic  Signature(s) Signed: 06/25/2023 9:34:52 AM By: Allen Derry PA-C Entered By: Allen Derry on 06/25/2023 06:34:52 -------------------------------------------------------------------------------- Physician Orders Details Patient Name: Date of Service: [name], [name] 06/25/2023 8:45 A M Medical Record Number: [id number] Patient Account Number: [id number] Date of Birth/Sex: Treating RN: Dec 23, 1965 (57 y.o. 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 Your Wound is healed! Electronic Signature(s) Signed: 06/25/2023 1:23:44 PM By: Karie Schwalbe RN Signed: 06/25/2023 3:48:11 PM By: Allen Derry PA-C Entered By: Karie Schwalbe on 06/25/2023 06:32:43 [name], [name] ([id number]) [id number] Page 5 of 9 -------------------------------------------------------------------------------- Problem List Details Patient Name: Date of Service: [name], [name] 06/25/2023 8:45 A M Medical Record Number: [id number] Patient Account Number: [id number] Date of Birth/Sex: Treating RN: 01/24/1966 (57 y.o. F) Primary Care Provider: [name] Other Clinician: Referring Provider: Treating Provider/Extender: [name] in Treatment: 64 Active Problems ICD-10 Encounter Code Description Active Date MDM Diagnosis L97.412 Non-pressure chronic ulcer of right heel and  midfoot with fat layer exposed 06/12/2022 No Yes M86.671 Other chronic osteomyelitis, right ankle and foot 08/28/2022 No Yes E11.621 Type 2 diabetes mellitus with foot ulcer 06/12/2022 No Yes I89.0 Lymphedema, not elsewhere classified 06/12/2022 No Yes L97.812 Non-pressure chronic ulcer of other part of right lower leg with fat layer 03/05/2023 No Yes exposed Inactive Problems Resolved Problems Electronic Signature(s) Signed: 06/25/2023 9:26:19 AM By: Allen Derry PA-C Entered By: Allen Derry on 06/25/2023 06:26:18 -------------------------------------------------------------------------------- Progress Note Details Patient Name: Date of Service: [name], [name] 06/25/2023 8:45 A M Medical Record Number: [id number] Patient Account Number: [id number] Date of Birth/Sex: Treating RN: 1966/04/18 (57 y.o. F) Primary Care Provider: [name] Other Clinician: Referring Provider: Treating Provider/Extender: [name] in Treatment: 21 Subjective Chief Complaint Information obtained from Patient Right heel ulcer History of Present Illness (HPI) [name], [name] ([id number]) [id number] Page 6 of 9 06-13-2022 upon evaluation today patient presents for evaluation of her right heel ulcer. She is having a tremendous amount of pain at this  0cm^2 area and 0cm^3 volume. There is no tunneling or undermining noted. There is a none present amount of drainage noted. The wound margin is distinct with the outline attached to  the wound base. There is no granulation within the wound bed. There is no necrotic tissue within the wound bed. The periwound skin appearance had no abnormalities noted for moisture. The periwound skin appearance did not exhibit: Callus, Crepitus, Excoriation, Induration, Rash, Scarring, Atrophie Blanche, Cyanosis, Ecchymosis, Hemosiderin Staining, Mottled, Pallor, Rubor, Erythema. Periwound temperature was noted as No Abnormality. The periwound has tenderness on palpation. Assessment Active Problems ICD-10 Non-pressure chronic ulcer of right heel and midfoot with fat layer exposed Other chronic osteomyelitis, right ankle and foot Type 2 diabetes mellitus with foot ulcer Lymphedema, not elsewhere classified Non-pressure chronic ulcer of other part of right lower leg with fat layer exposed Plan Discharge From [facility] Services: Discharge from Wound Care Center - Congratulations! Your Wound is healed! 1. On I am going to recommend that we going discontinue wound care services as the patient appears to be completely healed and she is in agreement with [name], [name] ([id number]) [phone].pdf Page 9 of 9 that plan. 2. I am also can recommend the patient should continue to baby and protect the heel. She should still use her scooter at work and I think that this will keep things moving in the right direction she voiced understanding and she is in agreement with that plan. We will see her back for a follow-up visit as needed. Electronic Signature(s) Signed: 06/25/2023 9:35:22 AM By: Allen Derry PA-C Entered By: Allen Derry on 06/25/2023 06:35:21 -------------------------------------------------------------------------------- SuperBill Details Patient Name: Date of Service: [name], [name] 06/25/2023 Medical Record Number: [id number] Patient Account Number: [id number] Date of Birth/Sex: Treating RN: 07/23/1966 (57 y.o. F) Primary Care Provider: [name] Other  Clinician: Referring Provider: Treating Provider/Extender: [name] in Treatment: 74 Diagnosis Coding ICD-10 Codes Code Description 5200107588 Non-pressure chronic ulcer of right heel and midfoot with fat layer exposed M86.671 Other chronic osteomyelitis, right ankle and foot E11.621 Type 2 diabetes mellitus with foot ulcer I89.0 Lymphedema, not elsewhere classified L97.812 Non-pressure chronic ulcer of other part of right lower leg with fat layer exposed Facility Procedures : CPT4 Code: 62703500 Description: 99213 - WOUND CARE VISIT-LEV 3 EST PT Modifier: Quantity: 1 Physician Procedures : CPT4 Code Description Modifier [id number] 99213 - WC PHYS LEVEL 3 - EST PT ICD-10 Diagnosis Description 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 There is no tunneling or undermining noted. There is a none present amount of drainage noted. The wound margin is distinct with the outline attached to  the wound base. There is no granulation within the wound bed. There is no necrotic tissue within the wound bed. The periwound skin appearance had no abnormalities noted for moisture. The periwound skin appearance did not exhibit: Callus, Crepitus, Excoriation, Induration, Rash, Scarring, Atrophie Blanche, Cyanosis, Ecchymosis, Hemosiderin Staining, Mottled, Pallor, Rubor, Erythema. Periwound temperature was noted as No Abnormality. The periwound has tenderness on palpation. Assessment Active Problems ICD-10 Non-pressure chronic ulcer of right heel and midfoot with fat layer exposed Other chronic osteomyelitis, right ankle and foot Type 2 diabetes mellitus with foot ulcer Lymphedema, not elsewhere classified Non-pressure chronic ulcer of other part of right lower leg with fat layer exposed Plan Discharge From [facility] Services: Discharge from Wound Care Center - Congratulations! Your Wound is healed! 1. On I am going to recommend that we going discontinue wound care services as the patient appears to be completely healed and she is in agreement with [name], [name] ([id number]) [phone].pdf Page 9 of 9 that plan. 2. I am also can recommend the patient should continue to baby and protect the heel. She should still use her scooter at work and I think that this will keep things moving in the right direction she voiced understanding and she is in agreement with that plan. We will see her back for a follow-up visit as needed. Electronic Signature(s) Signed: 06/25/2023 9:35:22 AM By: Allen Derry PA-C Entered By: Allen Derry on 06/25/2023 06:35:21 -------------------------------------------------------------------------------- SuperBill Details Patient Name: Date of Service: [name], [name] 06/25/2023 Medical Record Number: [id number] Patient Account Number: [id number] Date of Birth/Sex: Treating RN: 07/23/1966 (57 y.o. F) Primary Care Provider: [name] Other  Clinician: Referring Provider: Treating Provider/Extender: [name] in Treatment: 74 Diagnosis Coding ICD-10 Codes Code Description 5200107588 Non-pressure chronic ulcer of right heel and midfoot with fat layer exposed M86.671 Other chronic osteomyelitis, right ankle and foot E11.621 Type 2 diabetes mellitus with foot ulcer I89.0 Lymphedema, not elsewhere classified L97.812 Non-pressure chronic ulcer of other part of right lower leg with fat layer exposed Facility Procedures : CPT4 Code: 62703500 Description: 99213 - WOUND CARE VISIT-LEV 3 EST PT Modifier: Quantity: 1 Physician Procedures : CPT4 Code Description Modifier [id number] 99213 - WC PHYS LEVEL 3 - EST PT ICD-10 Diagnosis Description 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 No fevers, chills, nausea, vomiting, or diarrhea. Electronic Signature(s) Signed: 06/25/2023 9:34:37 AM By: Allen Derry PA-C Entered By: Allen Derry on 06/25/2023 06:34:37 [name], [name] ([id number]) [id number] Page 4 of 9 -------------------------------------------------------------------------------- Physical Exam Details Patient Name: Date of Service: [name], [name] 06/25/2023 8:45 A M Medical Record Number: [id number] Patient Account Number: [id number] Date of Birth/Sex: Treating RN: [birth year] (57 y.o. F) Primary Care Provider: [name] Other Clinician: Referring Provider: Treating Provider/Extender: [name] in Treatment: 1 Constitutional Chronically ill appearing but in no apparent acute distress. Respiratory normal breathing without difficulty. Psychiatric this patient is able to make decisions and demonstrates good insight into disease process. Alert and Oriented x 3. pleasant and cooperative. Notes Upon inspection patient's wound bed actually showed signs of good granulation epithelization at this point. Fortunately I do not see any signs of worsening overall and I do believe that the patient is making excellent headway towards complete closure. Electronic  Signature(s) Signed: 06/25/2023 9:34:52 AM By: Allen Derry PA-C Entered By: Allen Derry on 06/25/2023 06:34:52 -------------------------------------------------------------------------------- Physician Orders Details Patient Name: Date of Service: [name], [name] 06/25/2023 8:45 A M Medical Record Number: [id number] Patient Account Number: [id number] Date of Birth/Sex: Treating RN: Dec 23, 1965 (57 y.o. 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 Your Wound is healed! Electronic Signature(s) Signed: 06/25/2023 1:23:44 PM By: Karie Schwalbe RN Signed: 06/25/2023 3:48:11 PM By: Allen Derry PA-C Entered By: Karie Schwalbe on 06/25/2023 06:32:43 [name], [name] ([id number]) [id number] Page 5 of 9 -------------------------------------------------------------------------------- Problem List Details Patient Name: Date of Service: [name], [name] 06/25/2023 8:45 A M Medical Record Number: [id number] Patient Account Number: [id number] Date of Birth/Sex: Treating RN: 01/24/1966 (57 y.o. F) Primary Care Provider: [name] Other Clinician: Referring Provider: Treating Provider/Extender: [name] in Treatment: 64 Active Problems ICD-10 Encounter Code Description Active Date MDM Diagnosis L97.412 Non-pressure chronic ulcer of right heel and  midfoot with fat layer exposed 06/12/2022 No Yes M86.671 Other chronic osteomyelitis, right ankle and foot 08/28/2022 No Yes E11.621 Type 2 diabetes mellitus with foot ulcer 06/12/2022 No Yes I89.0 Lymphedema, not elsewhere classified 06/12/2022 No Yes L97.812 Non-pressure chronic ulcer of other part of right lower leg with fat layer 03/05/2023 No Yes exposed Inactive Problems Resolved Problems Electronic Signature(s) Signed: 06/25/2023 9:26:19 AM By: Allen Derry PA-C Entered By: Allen Derry on 06/25/2023 06:26:18 -------------------------------------------------------------------------------- Progress Note Details Patient Name: Date of Service: [name], [name] 06/25/2023 8:45 A M Medical Record Number: [id number] Patient Account Number: [id number] Date of Birth/Sex: Treating RN: 1966/04/18 (57 y.o. F) Primary Care Provider: [name] Other Clinician: Referring Provider: Treating Provider/Extender: [name] in Treatment: 21 Subjective Chief Complaint Information obtained from Patient Right heel ulcer History of Present Illness (HPI) [name], [name] ([id number]) [id number] Page 6 of 9 06-13-2022 upon evaluation today patient presents for evaluation of her right heel ulcer. She is having a tremendous amount of pain at this  0cm^2 area and 0cm^3 volume. There is no tunneling or undermining noted. There is a none present amount of drainage noted. The wound margin is distinct with the outline attached to  the wound base. There is no granulation within the wound bed. There is no necrotic tissue within the wound bed. The periwound skin appearance had no abnormalities noted for moisture. The periwound skin appearance did not exhibit: Callus, Crepitus, Excoriation, Induration, Rash, Scarring, Atrophie Blanche, Cyanosis, Ecchymosis, Hemosiderin Staining, Mottled, Pallor, Rubor, Erythema. Periwound temperature was noted as No Abnormality. The periwound has tenderness on palpation. Assessment Active Problems ICD-10 Non-pressure chronic ulcer of right heel and midfoot with fat layer exposed Other chronic osteomyelitis, right ankle and foot Type 2 diabetes mellitus with foot ulcer Lymphedema, not elsewhere classified Non-pressure chronic ulcer of other part of right lower leg with fat layer exposed Plan Discharge From [facility] Services: Discharge from Wound Care Center - Congratulations! Your Wound is healed! 1. On I am going to recommend that we going discontinue wound care services as the patient appears to be completely healed and she is in agreement with [name], [name] ([id number]) [phone].pdf Page 9 of 9 that plan. 2. I am also can recommend the patient should continue to baby and protect the heel. She should still use her scooter at work and I think that this will keep things moving in the right direction she voiced understanding and she is in agreement with that plan. We will see her back for a follow-up visit as needed. Electronic Signature(s) Signed: 06/25/2023 9:35:22 AM By: Allen Derry PA-C Entered By: Allen Derry on 06/25/2023 06:35:21 -------------------------------------------------------------------------------- SuperBill Details Patient Name: Date of Service: [name], [name] 06/25/2023 Medical Record Number: [id number] Patient Account Number: [id number] Date of Birth/Sex: Treating RN: 07/23/1966 (57 y.o. F) Primary Care Provider: [name] Other  Clinician: Referring Provider: Treating Provider/Extender: [name] in Treatment: 74 Diagnosis Coding ICD-10 Codes Code Description [id number] Non-pressure chronic ulcer of right heel and midfoot with fat layer exposed M86.671 Other chronic osteomyelitis, right ankle and foot E11.621 Type 2 diabetes mellitus with foot ulcer I89.0 Lymphedema, not elsewhere classified L97.812 Non-pressure chronic ulcer of other part of right lower leg with fat layer exposed Facility Procedures : CPT4 Code: 62703500 Description: 99213 - WOUND CARE VISIT-LEV 3 EST PT Modifier: Quantity: 1 Physician Procedures : CPT4 Code Description Modifier [id number] 99213 - WC PHYS LEVEL 3 - EST PT ICD-10 Diagnosis Description 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 There is no tunneling or undermining noted. There is a none present amount of drainage noted. The wound margin is distinct with the outline attached to  the wound base. There is no granulation within the wound bed. There is no necrotic tissue within the wound bed. The periwound skin appearance had no abnormalities noted for moisture. The periwound skin appearance did not exhibit: Callus, Crepitus, Excoriation, Induration, Rash, Scarring, Atrophie Blanche, Cyanosis, Ecchymosis, Hemosiderin Staining, Mottled, Pallor, Rubor, Erythema. Periwound temperature was noted as No Abnormality. The periwound has tenderness on palpation. Assessment Active Problems ICD-10 Non-pressure chronic ulcer of right heel and midfoot with fat layer exposed Other chronic osteomyelitis, right ankle and foot Type 2 diabetes mellitus with foot ulcer Lymphedema, not elsewhere classified Non-pressure chronic ulcer of other part of right lower leg with fat layer exposed Plan Discharge From [facility] Services: Discharge from Wound Care Center - Congratulations! Your Wound is healed! 1. On I am going to recommend that we going discontinue wound care services as the patient appears to be completely healed and she is in agreement with [name], [name] ([id number]) [phone] Page 9 of 9 that plan. 2. I am also can recommend the patient should continue to baby and protect the heel. She should still use her scooter at work and I think that this will keep things moving in the right direction she voiced understanding and she is in agreement with that plan. We will see her back for a follow-up visit as needed. Electronic Signature(s) Signed: 06/25/2023 9:35:22 AM By: Allen Derry PA-C Entered By: Allen Derry on 06/25/2023 06:35:21 -------------------------------------------------------------------------------- SuperBill Details Patient Name: Date of Service: [name], [name] 06/25/2023 Medical Record Number: [id number] Patient Account Number: [id number] Date of Birth/Sex: Treating RN: 07/23/1966 (57 y.o. F) Primary Care Provider: [name] Other  Clinician: Referring Provider: Treating Provider/Extender: [name] in Treatment: 74 Diagnosis Coding ICD-10 Codes Code Description [id number] Non-pressure chronic ulcer of right heel and midfoot with fat layer exposed M86.671 Other chronic osteomyelitis, right ankle and foot E11.621 Type 2 diabetes mellitus with foot ulcer I89.0 Lymphedema, not elsewhere classified L97.812 Non-pressure chronic ulcer of other part of right lower leg with fat layer exposed Facility Procedures : CPT4 Code: 62703500 Description: 99213 - WOUND CARE VISIT-LEV 3 EST PT Modifier: Quantity: 1 Physician Procedures : CPT4 Code Description Modifier [id number] 99213 - WC PHYS LEVEL 3 - EST PT ICD-10 Diagnosis Description L97.412 Non-pressure chronic ulcer of right heel and midfoot with fat layer exposed M86.671 Other chronic osteomyelitis, right ankle and foot E11.621  Type 2 diabetes mellitus with foot ulcer I89.0 Lymphedema, not elsewhere classified Quantity: 1 Electronic Signature(s) Signed: 06/25/2023 1:23:44 PM By: Karie Schwalbe RN Signed: 06/25/2023 3:48:11 PM By: Allen Derry PA-C Previous Signature: 06/25/2023 9:35:57 AM Version By: Allen Derry PA-C Entered By: Karie Schwalbe on 06/25/2023 06:45:43

## 2023-06-27 NOTE — Progress Notes (Signed)
0 Complex Wound Cleansing - multiple wounds X- 1 5 Wound Imaging (photographs - any number of wounds) []  - 0 Wound Tracing (instead of photographs) X- 1 5 Simple Wound Measurement - one wound []  - 0 Complex Wound Measurement - multiple wounds INTERVENTIONS - Wound Dressings []  - 0 Small Wound Dressing one or multiple wounds []  - 0 Medium Wound Dressing one or multiple wounds []  - 0 Large Wound Dressing one or multiple wounds []  - 0 Application of Medications - topical []  - 0 Application of Medications - injection INTERVENTIONS - Miscellaneous []  - 0 External ear exam []  - 0 Specimen Collection (cultures, biopsies, blood, body fluids, etc.) []  - 0 Specimen(s) / Culture(s) sent or taken to Lab for analysis []  - 0 Patient Transfer (multiple staff / Nurse, adult / Similar devices) []  - 0 Simple Staple / Suture removal (25 or less) []  - 0 Complex Staple / Suture removal (26 or more) []  - 0 Hypo / Hyperglycemic Management (close monitor of Blood Glucose) [name], [name] ([id number]) [id number] Page 3 of 7 []  - 0 Ankle / Brachial Index (ABI) - do not check if billed separately X- 1 5 Vital Signs Has the patient been seen at the hospital within the last three years: Yes Total Score: 85 Level Of Care: New/Established - Level 3 Electronic Signature(s) Signed: 06/25/2023 1:23:44 PM By: Karie Schwalbe RN Entered By: Karie Schwalbe on 06/25/2023 06:45:31 -------------------------------------------------------------------------------- Encounter Discharge Information Details Patient Name: Date of Service: [name], [name] 06/25/2023 8:45 A M Medical Record Number: [id number] Patient Account Number: [id number] Date of Birth/Sex: Treating RN: [birth year] (57 y.o.  [name] Primary Care Zyden Suman: [name] Other Clinician: Referring Phoenyx Melka: Treating Rokia Bosket/Extender: [name] in Treatment: 34 Encounter Discharge Information Items Discharge Condition: Stable Ambulatory Status: Ambulatory Discharge Destination: Home Transportation: Private Auto Accompanied By: son Schedule Follow-up Appointment: Yes Clinical Summary of Care: Patient Declined Electronic Signature(s) Signed: 06/25/2023 1:23:44 PM By: Karie Schwalbe RN Entered By: Karie Schwalbe on 06/25/2023 06:46:12 -------------------------------------------------------------------------------- Lower Extremity Assessment Details Patient Name: Date of Service: [name], [name] 06/25/2023 8:45 A M Medical Record Number: [id number] Patient Account Number: [id number] Date of Birth/Sex: Treating RN: [birth year] (57 y.o. F) Primary Care Berley Gambrell: [name] Other Clinician: Referring Almeter Westhoff: Treating Eniola Cerullo/Extender: [name] in Treatment: 54 Edema Assessment Assessed: [Left: No] [Right: No] Edema: [Left: Yes] [Right: Yes] Calf Left: Right: Point of Measurement: 32 cm From Medial Instep 55 cm 57 cm Ankle Left: Right: Point of Measurement: 10 cm From Medial Instep 32 cm 32 cm Vascular Assessment Pinette, Renae ([id number]) [Right:[id number] Page 4 of 7] Pulses: Dorsalis Pedis Palpable: [Left:Yes] [Right:Yes] Extremity colors, hair growth, and conditions: Extremity Color: [Right:Normal] Hair Growth on Extremity: [Right:No] Temperature of Extremity: [Right:Warm] Capillary Refill: [Right:< 3 seconds] Dependent Rubor: [Right:No Yes] Electronic Signature(s) Signed: 06/27/2023 12:48:07 PM By: Thayer Dallas Entered By: Thayer Dallas on 06/25/2023 06:17:04 -------------------------------------------------------------------------------- Multi-Disciplinary Care Plan Details Patient Name: Date of  Service: [name], [name] 06/25/2023 8:45 A M Medical Record Number: [id number] Patient Account Number: [id number] Date of Birth/Sex: Treating RN: 04/16/1966 (57 y.o. [name] Primary Care Taft Worthing: [name] Other Clinician: Referring Janan Bogie: Treating Lasaro Primm/Extender: [name] in Treatment: 27 Active Inactive Electronic Signature(s) Signed: 06/25/2023 1:23:44 PM By: Karie Schwalbe RN Entered By: Karie Schwalbe on 06/25/2023 06:33:06 -------------------------------------------------------------------------------- Pain Assessment Details Patient Name: Date of Service: [name], [name] 06/25/2023 8:45 A M Medical Record Number: [id number] Patient Account Number: [id number] Date of Birth/Sex:  [name], [name] ([id number]) [id number] Page 1 of 7 Visit Report for 06/25/2023 Arrival Information Details Patient Name: Date of Service: [name], [name] 06/25/2023 8:45 A M Medical Record Number: [id number] Patient Account Number: [id number] Date of Birth/Sex: Treating RN: [birth year] (57 y.o. F) Primary Care Charlen Bakula: [name] Other Clinician: Referring Jonae Renshaw: Treating Cecia Egge/Extender: [name] in Treatment: 62 Visit Information History Since Last Visit Added or deleted any medications: No Patient Arrived: Ambulatory Any new allergies or adverse reactions: No Arrival Time: 09:11 Had a fall or experienced change in No Accompanied By: son activities of daily living that may affect Transfer Assistance: None risk of falls: Patient Identification Verified: Yes Signs or symptoms of abuse/neglect since last visito No Patient Requires Transmission-Based Precautions: No Hospitalized since last visit: No Patient Has Alerts: Yes Implantable device outside of the clinic excluding No Patient Alerts: PICC in left arm cellular tissue based products placed in the center since last visit: Has Dressing in Place as Prescribed: Yes Pain Present Now: No Electronic Signature(s) Signed: 06/27/2023 12:48:07 PM By: Thayer Dallas Entered By: Thayer Dallas on 06/25/2023 06:12:14 -------------------------------------------------------------------------------- Clinic Level of Care Assessment Details Patient Name: Date of Service: [name], [name] 06/25/2023 8:45 A M Medical Record Number: [id number] Patient Account Number: [id number] Date of Birth/Sex: Treating RN: 27-Feb-1966 (57 y.o. [name] Primary Care Jimmy Stipes: [name] Other Clinician: Referring Gabbrielle Mcnicholas: Treating Kyleeann Cremeans/Extender: [name] in Treatment: 50 Clinic Level of Care Assessment Items TOOL 4 Quantity Score X- 1 0 Use when only an EandM is  performed on FOLLOW-UP visit ASSESSMENTS - Nursing Assessment / Reassessment X- 1 10 Reassessment of Co-morbidities (includes updates in patient status) X- 1 5 Reassessment of Adherence to Treatment Plan ASSESSMENTS - Wound and Skin A ssessment / Reassessment X - Simple Wound Assessment / Reassessment - one wound 1 5 []  - 0 Complex Wound Assessment / Reassessment - multiple wounds []  - 0 Dermatologic / Skin Assessment (not related to wound area) ASSESSMENTS - Focused Assessment []  - 0 Circumferential Edema Measurements - multi extremities []  - 0 Nutritional Assessment / Counseling / Intervention Wareing, Fanny ([id number]) [id number] Page 2 of 7 []  - 0 Lower Extremity Assessment (monofilament, tuning fork, pulses) []  - 0 Peripheral Arterial Disease Assessment (using hand held doppler) ASSESSMENTS - Ostomy and/or Continence Assessment and Care []  - 0 Incontinence Assessment and Management []  - 0 Ostomy Care Assessment and Management (repouching, etc.) PROCESS - Coordination of Care X - Simple Patient / Family Education for ongoing care 1 15 []  - 0 Complex (extensive) Patient / Family Education for ongoing care X- 1 10 Staff obtains Chiropractor, Records, T Results / Process Orders est X- 1 10 Staff telephones HHA, Nursing Homes / Clarify orders / etc []  - 0 Routine Transfer to another Facility (non-emergent condition) []  - 0 Routine Hospital Admission (non-emergent condition) []  - 0 New Admissions / Manufacturing engineer / Ordering NPWT Apligraf, etc. , []  - 0 Emergency Hospital Admission (emergent condition) X- 1 10 Simple Discharge Coordination []  - 0 Complex (extensive) Discharge Coordination PROCESS - Special Needs []  - 0 Pediatric / Minor Patient Management []  - 0 Isolation Patient Management []  - 0 Hearing / Language / Visual special needs []  - 0 Assessment of Community assistance (transportation, D/C planning, etc.) []  -  0 Additional assistance / Altered mentation []  - 0 Support Surface(s) Assessment (bed, cushion, seat, etc.) INTERVENTIONS - Wound Cleansing / Measurement X - Simple Wound Cleansing - one wound 1 5 []  -  [name], [name] ([id number]) [id number] Page 1 of 7 Visit Report for 06/25/2023 Arrival Information Details Patient Name: Date of Service: [name], [name] 06/25/2023 8:45 A M Medical Record Number: [id number] Patient Account Number: [id number] Date of Birth/Sex: Treating RN: [birth year] (57 y.o. F) Primary Care Charlen Bakula: [name] Other Clinician: Referring Jonae Renshaw: Treating Cecia Egge/Extender: [name] in Treatment: 62 Visit Information History Since Last Visit Added or deleted any medications: No Patient Arrived: Ambulatory Any new allergies or adverse reactions: No Arrival Time: 09:11 Had a fall or experienced change in No Accompanied By: son activities of daily living that may affect Transfer Assistance: None risk of falls: Patient Identification Verified: Yes Signs or symptoms of abuse/neglect since last visito No Patient Requires Transmission-Based Precautions: No Hospitalized since last visit: No Patient Has Alerts: Yes Implantable device outside of the clinic excluding No Patient Alerts: PICC in left arm cellular tissue based products placed in the center since last visit: Has Dressing in Place as Prescribed: Yes Pain Present Now: No Electronic Signature(s) Signed: 06/27/2023 12:48:07 PM By: Thayer Dallas Entered By: Thayer Dallas on 06/25/2023 06:12:14 -------------------------------------------------------------------------------- Clinic Level of Care Assessment Details Patient Name: Date of Service: [name], [name] 06/25/2023 8:45 A M Medical Record Number: [id number] Patient Account Number: [id number] Date of Birth/Sex: Treating RN: 27-Feb-1966 (57 y.o. [name] Primary Care Jimmy Stipes: [name] Other Clinician: Referring Gabbrielle Mcnicholas: Treating Kyleeann Cremeans/Extender: [name] in Treatment: 50 Clinic Level of Care Assessment Items TOOL 4 Quantity Score X- 1 0 Use when only an EandM is  performed on FOLLOW-UP visit ASSESSMENTS - Nursing Assessment / Reassessment X- 1 10 Reassessment of Co-morbidities (includes updates in patient status) X- 1 5 Reassessment of Adherence to Treatment Plan ASSESSMENTS - Wound and Skin A ssessment / Reassessment X - Simple Wound Assessment / Reassessment - one wound 1 5 []  - 0 Complex Wound Assessment / Reassessment - multiple wounds []  - 0 Dermatologic / Skin Assessment (not related to wound area) ASSESSMENTS - Focused Assessment []  - 0 Circumferential Edema Measurements - multi extremities []  - 0 Nutritional Assessment / Counseling / Intervention Wareing, Fanny ([id number]) [id number] Page 2 of 7 []  - 0 Lower Extremity Assessment (monofilament, tuning fork, pulses) []  - 0 Peripheral Arterial Disease Assessment (using hand held doppler) ASSESSMENTS - Ostomy and/or Continence Assessment and Care []  - 0 Incontinence Assessment and Management []  - 0 Ostomy Care Assessment and Management (repouching, etc.) PROCESS - Coordination of Care X - Simple Patient / Family Education for ongoing care 1 15 []  - 0 Complex (extensive) Patient / Family Education for ongoing care X- 1 10 Staff obtains Chiropractor, Records, T Results / Process Orders est X- 1 10 Staff telephones HHA, Nursing Homes / Clarify orders / etc []  - 0 Routine Transfer to another Facility (non-emergent condition) []  - 0 Routine Hospital Admission (non-emergent condition) []  - 0 New Admissions / Manufacturing engineer / Ordering NPWT Apligraf, etc. , []  - 0 Emergency Hospital Admission (emergent condition) X- 1 10 Simple Discharge Coordination []  - 0 Complex (extensive) Discharge Coordination PROCESS - Special Needs []  - 0 Pediatric / Minor Patient Management []  - 0 Isolation Patient Management []  - 0 Hearing / Language / Visual special needs []  - 0 Assessment of Community assistance (transportation, D/C planning, etc.) []  -  0 Additional assistance / Altered mentation []  - 0 Support Surface(s) Assessment (bed, cushion, seat, etc.) INTERVENTIONS - Wound Cleansing / Measurement X - Simple Wound Cleansing - one wound 1 5 []  -

## 2023-07-02 ENCOUNTER — Encounter (HOSPITAL_BASED_OUTPATIENT_CLINIC_OR_DEPARTMENT_OTHER): Payer: No Typology Code available for payment source | Admitting: Physician Assistant

## 2023-09-23 ENCOUNTER — Encounter: Payer: Self-pay | Admitting: Urology

## 2023-09-23 NOTE — Progress Notes (Signed)
Name: [name] DOB: November 10, 1965 MRN: [id number]  History of Present Illness: [name] is a 57 y.o. female who presents today as a new patient at [facility] [place]. All available relevant medical records have been reviewed.   Per chart review / referral notes: > 06/19/2023: Seen by PCP. Reported dysuria. UA showed nitrites, trace blood, no leukocytes. Ceftin prescribed for suspected UTI; no urine culture ordered. > 06/25/2023: Negative urine culture.  No additional urine culture results in past 12 months found per chart review.  Today: She reports that over the past 2-3 months she has had urethral / vaginal / bladder pain at rest and dysuria. The pain / burning sensation is constant and severe. She reports she was treated with antibiotics 2-3 times in the past 5-6 months for suspected UTIs and was also treated at urgent care once a couple months ago with Diflucan for possible yeast vaginitis and Flagyl for possible bacterial vaginitis - she denies any improvement with any of those medications. Has been taking "AZO and Uricalm constantly" 2-3 times per day for several months with only mild relief.   She also reports increased urinary frequency, nocturia, urgency, and urge incontinence. She reports voiding 10+ times per day while awake and wakes up approximately 4 times at night (during her sleep hours - she works overnight). She also reports stress incontinence but reports that the urge incontinence is predominant. She leaks at least once daily; denies wearing pads / diapers. She denies significant caffeine intake. Denies gross hematuria, hesitancy, straining to void, sensations of incomplete emptying. Denies flank pain, fevers, nausea, or vomiting.  She denies pushing on a bulge in order to empty her bladder; denies splinting to urinate. She denies history of kidney stones. Denies history of GU surgery.   Reports vaginal pain both at introitus and deep inside.  Reports external  vulvovaginal itching; has had some relief with applying topical aloe vera gel as needed. She denies vaginal bleeding or abnormal discharge.  Denies being sexually active.  Had 2 children via vaginal delivery. Denies vaginal bulge sensation.  Reports history of endometrial ablation in 2012 due to menorrhagia and has not had any periods since then; she therefore suspects that she is now menopausal.  Reports occasional constipation. Denies diarrhea, rectal bleeding, pain with defecation, straining to defecate, or fecal incontinence.  Takes Flexeril PRN for low back.   She reports having a large "fat ball" on her right medial thigh. States she is working on weight loss and improving her A1c so that she can have that removed at some point. Reports she will be starting to use insulin (Lantus) in the near future.    Fall Screening: Do you usually have a device to assist in your mobility? No   Medications: Current Outpatient Medications  Medication Sig Dispense Refill   ARIPiprazole (ABILIFY) 5 MG tablet Take 5 mg by mouth daily.     atorvastatin (LIPITOR) 20 MG tablet Take 20 mg by mouth daily.     cyclobenzaprine (FLEXERIL) 10 MG tablet Take 10 mg by mouth 2 (two) times daily.     escitalopram (LEXAPRO) 20 MG tablet Take 1 tablet by mouth daily.     estradiol (ESTRACE) 0.1 MG/GM vaginal cream Discard plastic applicator. Insert a blueberry size amount (approximately 1 gram) of cream on fingertip inside vagina at bedtime every night for 1 week then every other night. For long term use. 30 g 3   JARDIANCE 25 MG TABS tablet Take 25 mg by  mouth daily.     ketoconazole (NIZORAL) 2 % shampoo Apply 1 application topically 2 (two) times a week.     lidocaine (LINDAMANTLE) 3 % CREA cream Apply to vulvovaginal area as needed; can be done every 8 hours. Use 15 minutes prior to applying topical vaginal estrogen cream if that stings when used. 28 g 1   lisinopril (ZESTRIL) 20 MG tablet Take 1 tablet by mouth  daily.     Methen-Hyosc-Meth Blue-Na Phos (UROGESIC-BLUE) 81.6 MG TABS Take 1 tablet (81.6 mg total) by mouth every 6 (six) hours as needed. 120 tablet PRN   Multiple Vitamin (MULTI-VITAMIN) tablet Take 1 tablet by mouth daily.     nitrofurantoin, macrocrystal-monohydrate, (MACROBID) 100 MG capsule Take 1 capsule (100 mg total) by mouth 2 (two) times daily for 7 days. 14 capsule 0   nystatin (MYCOSTATIN/NYSTOP) powder Apply topically 2 (two) times daily. to affected area(s)     nystatin-triamcinolone (MYCOLOG II) cream Apply sparingly to affected area(s) twice daily. Therapy should be discontinued when control is achieved or if symptoms persist for >25 days of therapy. 30 g 1   Omega-3 1000 MG CAPS Take 2 capsules by mouth daily.     pantoprazole (PROTONIX) 40 MG tablet Take 1 tablet by mouth daily.     triamcinolone lotion (KENALOG) 0.1 % Apply 1 application topically daily.     No current facility-administered medications for this visit.    Allergies: No Known Allergies  Past Medical History:  Diagnosis Date   Back pain    Cellulitis    Chronic GERD    Diabetes mellitus without complication (HCC)    Hypertension    Past Surgical History:  Procedure Laterality Date   AMPUTATION Right 02/01/2021   Procedure: PARTIAL OR COMPLETE AMPUTATION RIGHT 2ND TOE;  Surgeon: Ferman Hamming, DPM;  Location: [facility];  Service: Podiatry;  Laterality: Right;   CHOLECYSTECTOMY     ENDOMETRIAL ABLATION     IRRIGATION AND DEBRIDEMENT FOOT Right 02/01/2021   Procedure: DEBRIDEMENT OF ULCERATION RIGHT HEEL;  Surgeon: Ferman Hamming, DPM;  Location: [facility];  Service: Podiatry;  Laterality: Right;   ORIF ANKLE FRACTURE Right    THYROID SURGERY Left    WOUND DEBRIDEMENT Bilateral 02/01/2021   Procedure: DEBRIDEMENT OF NAILS OF BOTH FEET AND DEBRIDEMENT OF A CALLUS OF THE LEFT FOOT;  Surgeon: Ferman Hamming, DPM;  Location: [facility];  Service: Podiatry;  Laterality: Bilateral;   History reviewed.  No pertinent family history. Social History   Socioeconomic History   Marital status: Married    Spouse name: Not on file   Number of children: Not on file   Years of education: Not on file   Highest education level: Not on file  Occupational History   Not on file  Tobacco Use   Smoking status: Never   Smokeless tobacco: Never  Vaping Use   Vaping status: Never Used  Substance and Sexual Activity   Alcohol use: Never   Drug use: Never   Sexual activity: Not on file  Other Topics Concern   Not on file  Social History Narrative   ** Merged History Encounter **       Social Drivers of Health   Financial Resource Strain: Not on file  Food Insecurity: No Food Insecurity (05/31/2022)   Hunger Vital Sign    Worried About Running Out of Food in the Last Year: Never true    Ran Out of Food in the Last Year: Never true  Transportation  Needs: No Transportation Needs (05/31/2022)   PRAPARE - Administrator, Civil Service (Medical): No    Lack of Transportation (Non-Medical): No  Physical Activity: Inactive (03/31/2019)   [facility], [facility]   Exercise Vital Sign    Days of Exercise per Week: 0 days    Minutes of Exercise per Session: 0 min  Stress: Not on file  Social Connections: Not on file  Intimate Partner Violence: Not At Risk (03/31/2019)   Received from [facility], [facility]   Humiliation, Afraid, Rape, and Kick questionnaire    Fear of Current or Ex-Partner: No    Emotionally Abused: No    Physically Abused: No    Sexually Abused: No    SUBJECTIVE  Review of Systems Constitutional: Patient denies any unintentional weight loss or change in strength lntegumentary: Patient denies any rashes or pruritus Cardiovascular: Patient denies chest pain or syncope Respiratory: Patient denies shortness of breath Gastrointestinal: Patient denies nausea, vomiting, constipation, or diarrhea Musculoskeletal: Patient denies muscle  cramps or weakness Neurologic: Patient denies convulsions or seizures Allergic/Immunologic: Patient denies recent allergic reaction(s) Hematologic/Lymphatic: Patient denies bleeding tendencies Endocrine: Patient denies heat/cold intolerance  GU: As per HPI.  OBJECTIVE Vitals:   09/26/23 1116  BP: 112/81  Pulse: (!) 102  Temp: 98.1 F (36.7 C)   There is no height or weight on file to calculate BMI.  Physical Examination Constitutional: No obvious distress; patient is non-toxic appearing  Cardiovascular: No visible lower extremity edema.  Respiratory: The patient does not have audible wheezing/stridor; respirations do not appear labored  Gastrointestinal: Obese abdomen  Musculoskeletal: Normal ROM of UEs  Skin: Massive localized lipolymphedema to right inner thigh (>18 inch diameter) with xerosis, no erythema / warmth / lesions Neurologic: CN 2-12 grossly intact Psychiatric: Answered questions appropriately with normal affect  Hematologic/Lymphatic/Immunologic: No obvious bruises or sites of spontaneous bleeding  Detailed Urogynecologic Evaluation: Pelvic Exam: External genitalia  normal in overall appearance however there is loss of labial architecture. Has an erythema rash across mons pubis extending into bilateral groin crural folds which is well demarcated and appears to be candidal. No skin lesions or discharge.  Urethral meatus midline and negative  for discharge, polyps, prolapse, caruncles, masses, or erythema. Urethra negative for tenderness or masses. Positive for hypermobility; negative CST however bladder had just been emptied via I&O cath.  Bladder positive for tenderness; no palpable masses. Vagina negative for discharge, irritation or lesions. Atrophic. No prolapse or levator diastasis. Cervix appears normal. Unable to perform bimanual exam to assess uterus / adnexa due to body habitus.  Pelvic floor is not hypertonic. Pelvic floor is not tender to  palpation.  Rectal Exam: Anal verge looks normal without hemorrhoids.  Pelvic exam was chaperoned by S. Chestine Spore, CMA.  PVR after voiding: 0 ml  Voided urine specimen: Urine microscopy: 6-10 WBC/hpf, 3-10 RBC/hpf, moderate bacteria, glucosuria   I&O cath'd urine specimen: Urine microscopy: >30 WBC/hpf, 0-2 RBC/hpf, moderate bacteria, glucosuria    ASSESSMENT Dysuria - Plan: Urinalysis, Routine w reflex microscopic, BLADDER SCAN AMB NON-IMAGING, Urine Culture, ciprofloxacin (CIPRO) tablet 500 mg, In and Out Cath, Urinalysis, Routine w reflex microscopic, nitrofurantoin, macrocrystal-monohydrate, (MACROBID) 100 MG capsule, estradiol (ESTRACE) 0.1 MG/GM vaginal cream, Methen-Hyosc-Meth Blue-Na Phos (UROGESIC-BLUE) 81.6 MG TABS, lidocaine (LINDAMANTLE) 3 % CREA cream  Abnormal urinalysis - Plan: Urine Culture, ciprofloxacin (CIPRO) tablet 500 mg, In and Out Cath, Urinalysis, Routine w reflex microscopic, nitrofurantoin, macrocrystal-monohydrate, (MACROBID) 100 MG capsule, Methen-Hyosc-Meth  Blue-Na Phos (UROGESIC-BLUE) 81.6 MG TABS  Mixed stress and urge urinary incontinence - Plan: ciprofloxacin (CIPRO) tablet 500 mg, In and Out Cath, Urinalysis, Routine w reflex microscopic  Bladder pain - Plan: ciprofloxacin (CIPRO) tablet 500 mg, In and Out Cath, Urinalysis, Routine w reflex microscopic, nitrofurantoin, macrocrystal-monohydrate, (MACROBID) 100 MG capsule, Methen-Hyosc-Meth Blue-Na Phos (UROGESIC-BLUE) 81.6 MG TABS  Atrophic vaginitis - Plan: ciprofloxacin (CIPRO) tablet 500 mg, In and Out Cath, Urinalysis, Routine w reflex microscopic, estradiol (ESTRACE) 0.1 MG/GM vaginal cream  Urinary urgency - Plan: ciprofloxacin (CIPRO) tablet 500 mg, In and Out Cath, Urinalysis, Routine w reflex microscopic, nitrofurantoin, macrocrystal-monohydrate, (MACROBID) 100 MG capsule  Urinary frequency - Plan: ciprofloxacin (CIPRO) tablet 500 mg, In and Out Cath, Urinalysis, Routine w reflex microscopic,  nitrofurantoin, macrocrystal-monohydrate, (MACROBID) 100 MG capsule  Vulvar itching - Plan: ciprofloxacin (CIPRO) tablet 500 mg, In and Out Cath, Urinalysis, Routine w reflex microscopic, estradiol (ESTRACE) 0.1 MG/GM vaginal cream, nystatin-triamcinolone (MYCOLOG II) cream, lidocaine (LINDAMANTLE) 3 % CREA cream  Mass of thigh, right - massive localized lipolymphedema to right inner thigh (>18 inch diameter) - Plan: Urinalysis, Routine w reflex microscopic  Intertrigo of genitocrural region due to Candida species - Plan: nystatin-triamcinolone (MYCOLOG II) cream  1. LUTS: Dysuria, bladder pain, frequency, nocturia, urgency. Abnormal UA today. Will send the catheterized urine specimen for culture and treat empirically with Macrobid while awaiting culture results and sensitivities.  For chronic dysuria / LUTS we discussed possible etiologies which include but are not limited to: recurrent UTI, non-infectious cystitis (such as interstitial cystitis), stone, mass, vaginal atrophy, and/or OAB.   We discussed her risk factors for UTIs including vaginal atrophy, uncontrolled diabetes, and glucosuria due to [place] use.  We discussed importance of limiting Pyridium (phenazopyridine) use to 3 days consecutively due to risk for liver injury, methemoglobinemia, and damage to bone health. Offered prescription for Urogesic blue or similar as a prescription alternative.  For UTI prevention we discussed the following options, for which detailed information has been included in Patient Information section of today's After Visit Summary. > Maintain adequate fluid intake daily to flush out the urinary tract. > Go to the bathroom to urinate every 4-6 hours while awake to minimize urinary stasis / bacterial overgrowth in the bladder. > Proanthocyanidin (PAC) supplement 36 mg daily; must be soluble (insoluble form of PAC will be ineffective). Recommend Ellura.  > D-mannose 2 g daily to minimize bacterial  adherence in urinary tract > Vitamin C supplement to acidify urine to suppress bacterial growth > Probiotic to maintain healthy vaginal microbiome > Topical vaginal estrogen for vaginal atrophy.  - The etiology and consequences of urogenital epithelial atrophy was explained to patient.  - The normal bacterial flora that colonizes the perineum may contribute to UTI risk because the thin urethral epithelium allows the bacteria to become adherent and the change in vaginal pH can disrupt the vaginal / urethral microbiome and allow for bacterial overgrowth.  - Patient was advised that topical vaginal estrogen replacement will take about 3 months to restore the vaginal pH and may sting/burn initially due to severe dryness, which will improve with ongoing treatment.  - We discussed that there have been studies that evaluate use of low-dose intravaginal estrogen cream that shows minimal systemic absorption that is negligible after 3 weeks. There have been no studies indicating increased risk of contributing to breast cancer development or recurrence.  Cautioned patient about sugar content of D-mannose for UTI prevention due to patient's history of diabetes.  2.  Mixed urinary incontinence (urge predominant). Will address once UTI(s) well managed.  3. Vaginal atrophy. As above.  4. Candidal genitocrural rash. Mycolog (nystatin / triamcinolone) prescribed. Advised short term use only as needed due to risk for tissue thinning with prolonged steroid use.  Will plan for follow up in 1 month or sooner if needed. Pt verbalized understanding and agreement. All questions were answered.  PLAN Advised the following: 1. Urine culture.  2. Macrobid (Nitrofurantoin) 100 mg 2x/day x7 days. 3. Urogesic blue as alternative to OTC urinary analgesics (Pyridium / Uricalm). 4. Mycolog cream for topical rash. 5. Start topical vaginal estrogen cream as prescribed. 6. Maintain adequate fluid intake daily to flush out the  urinary tract. 7. Consider OTC supplements for UTI prevention. 8. Return in about 4 weeks (around 10/24/2023) for UA, PVR, & f/u with Evette Georges NP.  Orders Placed This Encounter  Procedures   Urine Culture   Urinalysis, Routine w reflex microscopic   Urinalysis, Routine w reflex microscopic   In and Out Cath   BLADDER SCAN AMB NON-IMAGING   Total time spent caring for the patient today was over 60 minutes. This includes time spent on the date of the visit reviewing the patient's chart before the visit, time spent during the visit, and time spent after the visit on documentation. Over 50% of that time was spent in face-to-face time with this patient for direct counseling. E&M based on time and complexity of medical decision making.  It has been explained that the patient is to follow regularly with their PCP in addition to all other providers involved in their care and to follow instructions provided by these respective offices. Patient advised to contact urology clinic if any urologic-pertaining questions, concerns, new symptoms or problems arise in the interim period.  Patient Instructions  Plan: 1. Urine culture.  2. Macrobid (Nitrofurantoin) 100 mg 2x/day x7 days. 3. Urogesic blue as alternative to OTC urinary analgesics (Pyridium / Uricalm). Do not take at same time as your Lexapro and Flexeril. 4. Mycolog cream for topical rash. 5. Start topical vaginal estrogen cream as prescribed. Lidocaine cream 15 minutes prior the topical vaginal estrogen cream if you find that using the estrogen cream stings or burns for the first few days / weeks. 6. Maintain adequate fluid intake daily to flush out the urinary tract. 7. Consider OTC supplements for UTI prevention. 8. Return in about 4 weeks.      Recommendations regarding UTI prevention / management:  Options when UTI symptoms occur: 1. Call [facility] [place] to request urgent / same-day visit (phone # [phone]).   2. Call your Primary Care Provider (PCP) office to request urgent / same-day visit. Be sure to request for urine culture to be ordered and have results faxed to Urology (fax # [phone]).  3. Go to urgent care. Be sure to request for urine culture to be ordered and have results faxed to Urology (fax # [phone]).    Routine use for UTI prevention: - Topical vaginal estrogen for vaginal atrophy. - Adequate daily fluid intake to flush out the urinary tract. - Go to the bathroom to urinate every 4-6 hours while awake to minimize urinary stasis / bacterial overgrowth in the bladder. - Proanthocyanidin (PAC) supplement 36 mg daily; must be soluble (insoluble form of PAC will be ineffective). Recommended brand: Ellura. This is an over-the-counter supplement (often must be found/ purchased online) supplement derived from cranberries with concentrated active component: Proanthocyanidin (PAC) 36 mg daily. Decreases bacterial  adherence to bladder lining.  - D-mannose powder (2 grams daily). This is an over-the-counter supplement which decreases bacterial adherence to bladder lining (it is a sugar that inhibits bacterial adherence to urothelial cells by binding to the pili of enteric bacteria). Take as per manufacturer recommendation. Can be used as an alternative or in addition to the concentrated cranberry supplement.  - Vitamin C supplement to acidify urine to minimize bacterial growth.  - Probiotic to maintain healthy vaginal microbiome to suppress bacteria at urethral opening. Brand recommendations: Darrold Junker (includes probiotic & D-mannose ), Feminine Balance (highest concentration of lactobacillus) or Hyperbiotic Pro 15.  Note for patients with diabetes:  - Be aware that D-mannose contains sugar.        Vulvovaginal atrophy I Genitourinary syndrome of menopause (GSM):  What it is: Changes in the vaginal environment (including the vulva and urethra) including: Thinning of the epithelium  (skin/ mucosa surface) Can contribute to urinary urgency and frequency Can contribute to dryness, itching, irritation of the vulvar and vaginal tissue Can contribute to pain with intercourse Can contribute to physical changes of the labia, vulva, and vagina such as: Narrowing of the vaginal opening Decreased vaginal length Loss of labial architecture Labial adhesions Pale color of vulvovaginal tissue  Loss of pubic hair Allows bacteria to become adherent  Results in increased risk for urinary tract infection (UTI) due to bacterial overgrowth and migration up the urethra into the bladder Change in vaginal pH (acid/ base balance) Allows for alteration / disruption of the normal bacterial flora / microbiome Results in increased risk for urinary tract infection (UTI) due to bacterial overgrowth  Treatment options: Over-the-counter lubricants (see list below). Prescription vaginal estrogen replacement. Options: Topical vaginal estrogen cream Estrace, Premarin, or compounded estradiol cream/ gel We advise: Discard plastic applicator as that tends to use more medication than you need, which is not harmful but wastes / uses up the medication. Also the plastic applicator may cause discomfort. Insert blueberry size amount of medication via the tip of your finger inside vagina nightly for 1 week then 2-3 times per week (long term). Estring vaginal ring Exchanged every 3 months (either at home or in office by provider) Vagifem vaginal tablet Inserted nightly for 2 weeks then twice a week (long term) lntrarosa vaginal suppository Vaginal DHEA: converts to estrogen in vaginal tissue without systemic effect Inserted nightly (long term) Vaginal laser therapy (Mona Lisa touch) Performed in 3 treatments each 6 weeks apart (available in our [place] office). Can feel like a sunburn for 3-4 days after each treatment until new skin heals in. Usually not covered by insurance. Estimated cost is $1500 for  all 3 sessions.  FYI regarding prescription vaginal estrogen treatment options: All topical vaginal estrogen replacement options are equivalent in terms of efficacy. Topical vaginal estrogen replacement will take about 3 months to be effective. OK to have sex with any of the topical vaginal estrogen replacement options. Topical vaginal estrogen replacement may sting/burn initially due to severe dryness, which will improve with ongoing treatment. There have been studies that evaluate use of low-dose intravaginal estrogen that show minimal systemic absorption which is negligible after 3 weeks. There have been no studies indicating increased risk of contributing to cancer development or recurrence.  Topical vaginal estrogen cream safe to use with breast cancer history [email or web address]  Topical vaginal estrogen cream safe to use with blood clot history [email or web address]   Lubricants and Moisturizers for Treating Genitourinary Syndrome of Menopause and Vulvovaginal Atrophy Treatment Comments I Available Products  Lubricants   Water-based Ingredients: Deionized water, glycerin, propylene glycol; latex safe; rare irritation; dry out with extended sexual activity Astroglide, Good Clean Love, K-Y Jelly, Natural, Organic, Pink, Sliquid, Sylk, Yes    Oil Based Ingredients: avocado, olive, peanut, corn; latex safe; can be used with silicone products; staining; safe (unless peanut allergy); non-irritating Coconut oil, vegetable oil, vitamin E oil  Silicone-Based Ingredients: Silicone polymers; staining; typically nonirritating, long lasting; waterproof; should not be used with silicone dilators, sexual toys, or gynecologic products Astroglide X, Oceanus  Ultra Pure, Pink Silicone, Pjur Eros, Replens Silky Smooth, Silicone Premium JO, SKYN, Uberlube, [employer] Based Minimize harm to sperm motility; designed Astroglide TTC, Conceive Plus, Pre for couples trying to conceive Seed, Yes Baby  Fertility Friendly Minimize harm to sperm motility; designed Astroglide, TTC, Conceive Plus, Pre for couples trying to conceive Seed, Yes Baby  Vaginal Moisturizers   Vaginal Moisturizers For maintenance use 1 to 3 times weekly; can benefit women with dryness, chafing with AOL, and recurrent vaginal infections irrespective of sexual activity timing Balance Active Menopause Vaginal Moisturizing Lubricant, Canesintima Intimate Moisturizer, Replens, Rephresh, Sylk Natural Intimate Moisturizer, Yes Vaginal Moisturizer  Hybrids Properties of both water and silicone-based products (combination of a vaginal lubricant and moisturizer); Non-irritating; good option for women with allergies and sensitivities Lubrigyn, Luvena  Suppositories Hyaluronic acid to retain moisture Revaree  Vulvar Soothing Creams/Oils    Medicated CreamsP ain and burn relief; Ingredients: 4% Lidocaine, Aloe Vera gel Releveum ([place])  Non-Medicated Creams For anti-itch and moisture/maintenance; Ingredients: Coconut oil, Avocado oil, Shea Butter, Olive oil, Vitamin E Vajuvenate, Vmagic  Oils !For moisture/maintenance !Coconut oil, Vitamin E oil, Emu oil     Electronically signed by:  Donnita Falls, MSN, FNP-C, CUNP 09/26/2023 3:00 PM

## 2023-09-24 ENCOUNTER — Encounter (HOSPITAL_BASED_OUTPATIENT_CLINIC_OR_DEPARTMENT_OTHER): Payer: No Typology Code available for payment source | Attending: Internal Medicine | Admitting: Internal Medicine

## 2023-09-24 DIAGNOSIS — Z89421 Acquired absence of other right toe(s): Secondary | ICD-10-CM | POA: Diagnosis not present

## 2023-09-24 DIAGNOSIS — E11621 Type 2 diabetes mellitus with foot ulcer: Secondary | ICD-10-CM | POA: Diagnosis present

## 2023-09-24 DIAGNOSIS — I890 Lymphedema, not elsewhere classified: Secondary | ICD-10-CM | POA: Insufficient documentation

## 2023-09-24 DIAGNOSIS — L97511 Non-pressure chronic ulcer of other part of right foot limited to breakdown of skin: Secondary | ICD-10-CM | POA: Diagnosis not present

## 2023-09-25 NOTE — Progress Notes (Signed)
Trueba, [name] ([id number]) [id number] Page 1 of 4 Visit Report for 09/24/2023 Abuse Risk Screen Details Patient Name: Date of Service: [name], [name] 09/24/2023 8:00 A M Medical Record Number: [id number] Patient Account Number: [id number] Date of Birth/Sex: Treating RN: [birth year] (57 y.o. [name], Yvonne Kendall Primary Care Lecil Tapp: [name] Other Clinician: Referring Sharday Michl: Treating Breonia Kirstein/Extender: Delora Fuel in Treatment: 0 Abuse Risk Screen Items Answer ABUSE RISK SCREEN: Has anyone close to you tried to hurt or harm you recentlyo No Do you feel uncomfortable with anyone in your familyo No Has anyone forced you do things that you didnt want to doo No Electronic Signature(s) Signed: 09/24/2023 5:35:56 PM By: Shawn Stall RN, BSN Entered By: Shawn Stall on 09/24/2023 08:42:23 -------------------------------------------------------------------------------- Activities of Daily Living Details Patient Name: Date of Service: [name], [name] 09/24/2023 8:00 A M Medical Record Number: [id number] Patient Account Number: [id number] Date of Birth/Sex: Treating RN: December 19, 1965 (57 y.o. [name] Primary Care Calloway Andrus: [name] Other Clinician: Referring Najmo Pardue: Treating Shah Insley/Extender: Delora Fuel in Treatment: 0 Activities of Daily Living Items Answer Activities of Daily Living (Please select one for each item) Drive Automobile Completely Able T Medications ake Completely Able Use T elephone Completely Able Care for Appearance Completely Able Use T oilet Completely Able Bath / Shower Completely Able Dress Self Completely Able Feed Self Completely Able Walk Completely Able Get In / Out Bed Completely Able Housework Completely Able Prepare Meals Completely Able Handle Money Completely Able Shop for Self Completely Able Electronic Signature(s) Signed: 09/24/2023 5:35:56 PM By:  Shawn Stall RN, BSN Entered By: Shawn Stall on 09/24/2023 08:42:28 Walther, Andreka ([id number]) [id number] Page 2 of 4 -------------------------------------------------------------------------------- Education Screening Details Patient Name: Date of Service: [name], [name] 09/24/2023 8:00 A M Medical Record Number: [id number] Patient Account Number: [id number] Date of Birth/Sex: Treating RN: Nov 29, 1965 (57 y.o. [name], Yvonne Kendall Primary Care Sherma Vanmetre: [name] Other Clinician: Referring Caryl Manas: Treating Izack Hoogland/Extender: Delora Fuel in Treatment: 0 Primary Learner Assessed: Patient Learning Preferences/Education Level/Primary Language Learning Preference: Explanation, Demonstration, Printed Material Highest Education Level: College or Above Preferred Language: English Cognitive Barrier Language Barrier: No Translator Needed: No Memory Deficit: No Emotional Barrier: No Cultural/Religious Beliefs Affecting Medical Care: No Physical Barrier Impaired Vision: No Impaired Hearing: No Decreased Hand dexterity: No Knowledge/Comprehension Knowledge Level: High Comprehension Level: High Ability to understand written instructions: High Ability to understand verbal instructions: High Motivation Anxiety Level: Calm Cooperation: Cooperative Education Importance: Acknowledges Need Interest in Health Problems: Asks Questions Perception: Coherent Willingness to Engage in Self-Management High Activities: Readiness to Engage in Self-Management High Activities: Electronic Signature(s) Signed: 09/24/2023 5:35:56 PM By: Shawn Stall RN, BSN Entered By: Shawn Stall on 09/24/2023 08:42:38 -------------------------------------------------------------------------------- Fall Risk Assessment Details Patient Name: Date of Service: Kleinpeter, Ciani 09/24/2023 8:00 A M Medical Record Number: [id number] Patient Account Number:  [id number] Date of Birth/Sex: Treating RN: Feb 28, 1966 (57 y.o. [name] Primary Care Brok Stocking: [name] Other Clinician: Referring Rishik Tubby: Treating Beryle Zeitz/Extender: Delora Fuel in Treatment: 0 Fall Risk Assessment Items Have you had 2 or more falls in the last 12 monthso 0 No Hench, Maili ([id number]) [id number] Page 3 of 4 Have you had any fall that resulted in injury in the last 12 monthso 0 No FALLS RISK SCREEN History of falling - immediate or within 3 months 0 No Secondary diagnosis (Do you have 2 or more medical diagnoseso) 0 No Ambulatory aid None/bed rest/wheelchair/nurse 0 No Crutches/cane/walker 0 No  Furniture 0 No Intravenous therapy Access/Saline/Heparin Lock 0 No Gait/Transferring Normal/ bed rest/ wheelchair 0 No Weak (short steps with or without shuffle, stooped but able to lift head while walking, may seek 0 No support from furniture) Impaired (short steps with shuffle, may have difficulty arising from chair, head down, impaired 0 No balance) Mental Status Oriented to own ability 0 No Electronic Signature(s) Signed: 09/24/2023 5:35:56 PM By: Shawn Stall RN, BSN Entered By: Shawn Stall on 09/24/2023 08:42:48 -------------------------------------------------------------------------------- Foot Assessment Details Patient Name: Date of Service: [name], [name] 09/24/2023 8:00 A M Medical Record Number: [id number] Patient Account Number: [id number] Date of Birth/Sex: Treating RN: [birth year] (57 y.o. [name] Primary Care Johnryan Sao: [name] Other Clinician: Referring Londan Coplen: Treating Calianna Kim/Extender: Delora Fuel in Treatment: 0 Foot Assessment Items Site Locations + = Sensation present, - = Sensation absent, C = Callus, U = Ulcer R = Redness, W = Warmth, M = Maceration, PU = Pre-ulcerative lesion F = Fissure, S = Swelling, D =  Dryness Assessment Right: Left: Other Deformity: No No Prior Foot Ulcer: No No Prior Amputation: No No Charcot Joint: No No Ambulatory Status: Ambulatory Without Help GaitRaisa Aurelio, Shadara ([id number]) [id number] Page 4 of 4 Electronic Signature(s) Signed: 09/24/2023 5:35:56 PM By: Shawn Stall RN, BSN Entered By: Shawn Stall on 09/24/2023 08:47:24 -------------------------------------------------------------------------------- Nutrition Risk Screening Details Patient Name: Date of Service: SHAYE, LOISELLE 09/24/2023 8:00 A M Medical Record Number: [id number] Patient Account Number: [id number] Date of Birth/Sex: Treating RN: [birth year] (57 y.o. [name] Primary Care Tayvin Preslar: [name] Other Clinician: Referring Lacrystal Barbe: Treating Amiley Shishido/Extender: Delora Fuel in Treatment: 0 Height (in): Weight (lbs): Body Mass Index (BMI): Nutrition Risk Screening Items Score Screening NUTRITION RISK SCREEN: I have an illness or condition that made me change the kind and/or amount of food I eat 2 Yes I eat fewer than two meals per day 0 No I eat few fruits and vegetables, or milk products 0 No I have three or more drinks of beer, liquor or wine almost every day 0 No I have tooth or mouth problems that make it hard for me to eat 0 No I don't always have enough money to buy the food I need 0 No I eat alone most of the time 0 No I take three or more different prescribed or over-the-counter drugs a day 1 Yes Without wanting to, I have lost or gained 10 pounds in the last six months 0 No I am not always physically able to shop, cook and/or feed myself 2 Yes Nutrition Protocols Good Risk Protocol Provide education on elevated blood Moderate Risk Protocol 0 sugars and impact on wound healing, as applicable High Risk Proctocol Risk Level: Moderate Risk Score: 5 Electronic Signature(s) Signed: 09/24/2023 5:35:56 PM By:  Shawn Stall RN, BSN Entered By: Shawn Stall on 09/24/2023 08:42:52

## 2023-09-26 ENCOUNTER — Encounter: Payer: Self-pay | Admitting: Urology

## 2023-09-26 ENCOUNTER — Ambulatory Visit (INDEPENDENT_AMBULATORY_CARE_PROVIDER_SITE_OTHER): Payer: No Typology Code available for payment source | Admitting: Urology

## 2023-09-26 VITALS — BP 112/81 | HR 102 | Temp 98.1°F

## 2023-09-26 DIAGNOSIS — N3946 Mixed incontinence: Secondary | ICD-10-CM

## 2023-09-26 DIAGNOSIS — R300 Dysuria: Secondary | ICD-10-CM

## 2023-09-26 DIAGNOSIS — R8290 Unspecified abnormal findings in urine: Secondary | ICD-10-CM | POA: Diagnosis not present

## 2023-09-26 DIAGNOSIS — B372 Candidiasis of skin and nail: Secondary | ICD-10-CM

## 2023-09-26 DIAGNOSIS — L292 Pruritus vulvae: Secondary | ICD-10-CM

## 2023-09-26 DIAGNOSIS — R3915 Urgency of urination: Secondary | ICD-10-CM

## 2023-09-26 DIAGNOSIS — N952 Postmenopausal atrophic vaginitis: Secondary | ICD-10-CM

## 2023-09-26 DIAGNOSIS — R2241 Localized swelling, mass and lump, right lower limb: Secondary | ICD-10-CM

## 2023-09-26 DIAGNOSIS — R3989 Other symptoms and signs involving the genitourinary system: Secondary | ICD-10-CM

## 2023-09-26 DIAGNOSIS — R350 Frequency of micturition: Secondary | ICD-10-CM

## 2023-09-26 LAB — URINALYSIS, ROUTINE W REFLEX MICROSCOPIC
Bilirubin, UA: NEGATIVE
Bilirubin, UA: NEGATIVE
Ketones, UA: NEGATIVE
Ketones, UA: NEGATIVE
Leukocytes,UA: NEGATIVE
Leukocytes,UA: NEGATIVE
Nitrite, UA: POSITIVE — AB
Nitrite, UA: POSITIVE — AB
Protein,UA: NEGATIVE
Protein,UA: NEGATIVE
RBC, UA: NEGATIVE
Specific Gravity, UA: <1.005 — ABNORMAL LOW (ref 1.005–1.030)
Specific Gravity, UA: 1.01 (ref 1.005–1.030)
Urobilinogen, Ur: 1 mg/dL (ref 0.2–1.0)
Urobilinogen, Ur: 0.2 mg/dL (ref 0.2–1.0)
pH, UA: 6.5 (ref 5.0–7.5)
pH, UA: 6 (ref 5.0–7.5)

## 2023-09-26 LAB — MICROSCOPIC EXAMINATION: WBC, UA: >30 /[HPF] — AB (ref 0–5)

## 2023-09-26 MED ORDER — LIDOCAINE HCL 3 % EX CREA
TOPICAL_CREAM | CUTANEOUS | 1 refills | Status: AC
Start: 2023-09-26 — End: ?

## 2023-09-26 MED ORDER — NYSTATIN-TRIAMCINOLONE 100000-0.1 UNIT/GM-% EX CREA
TOPICAL_CREAM | CUTANEOUS | 1 refills | Status: AC
Start: 2023-09-26 — End: ?

## 2023-09-26 MED ORDER — CIPROFLOXACIN HCL 500 MG PO TABS
500.0000 mg | ORAL_TABLET | Freq: Once | ORAL | Status: AC
Start: 2023-09-26 — End: 2023-09-26
  Administered 2023-09-26: 500 mg via ORAL

## 2023-09-26 MED ORDER — ESTRADIOL 0.1 MG/GM VA CREA
TOPICAL_CREAM | VAGINAL | 3 refills | Status: AC
Start: 2023-09-26 — End: ?

## 2023-09-26 MED ORDER — UROGESIC-BLUE 81.6 MG PO TABS
1.0000 | ORAL_TABLET | Freq: Four times a day (QID) | ORAL | 99 refills | Status: AC | PRN
Start: 2023-09-26 — End: ?

## 2023-09-26 MED ORDER — NITROFURANTOIN MONOHYD MACRO 100 MG PO CAPS
100.0000 mg | ORAL_CAPSULE | Freq: Two times a day (BID) | ORAL | 0 refills | Status: AC
Start: 2023-09-26 — End: 2023-10-03

## 2023-09-26 NOTE — Progress Notes (Signed)
Ponder, [name] ([id number]) [id number] Page 1 of 8 Visit Report for 09/24/2023 Allergy List Details Patient Name: Date of Service: [name], [name] 09/24/2023 8:00 A M Medical Record Number: [id number] Patient Account Number: [id number] Date of Birth/Sex: Treating RN: 03-16-1966 (57 y.o. [name] Primary Care Kyliana Standen: [name] Other Clinician: Referring Libertie Hausler: Treating Erhard Senske/Extender: Delora Fuel in Treatment: 0 Allergies Active Allergies No Known Allergies Allergy Notes Electronic Signature(s) Signed: 09/24/2023 5:35:56 PM By: Shawn Stall RN, BSN Entered By: Shawn Stall on 09/24/2023 05:41:27 -------------------------------------------------------------------------------- Arrival Information Details Patient Name: Date of Service: [name], [name] 09/24/2023 8:00 A M Medical Record Number: [id number] Patient Account Number: [id number] Date of Birth/Sex: Treating RN: 1966/10/05 (57 y.o. [name], Millard.Loa Primary Care Kaelei Wheeler: [name] Other Clinician: Referring Addis Bennie: Treating Tamsen Reist/Extender: Delora Fuel in Treatment: 0 Visit Information Patient Arrived: Ambulatory Arrival Time: 08:35 Accompanied By: self Transfer Assistance: None Patient Identification Verified: Yes Secondary Verification Process Completed: Yes Patient Requires Transmission-Based Precautions: No Patient Has Alerts: No History Since Last Visit All ordered tests and consults were completed: No Added or deleted any medications: No Any new allergies or adverse reactions: No Had a fall or experienced change in activities of daily living that may affect risk of falls: No Signs or symptoms of abuse/neglect since last visito No Hospitalized since last visit: No Implantable device outside of the clinic excluding cellular tissue based products placed in the center since last visit: No Electronic Signature(s) Signed:  09/24/2023 5:35:56 PM By: Shawn Stall RN, BSN Entered By: Shawn Stall on 09/24/2023 05:40:55 Escutia, Daron ([id number]) [id number] Page 2 of 8 -------------------------------------------------------------------------------- Clinic Level of Care Assessment Details Patient Name: Date of Service: [name], [name] 09/24/2023 8:00 A M Medical Record Number: [id number] Patient Account Number: [id number] Date of Birth/Sex: Treating RN: 1966/09/10 (57 y.o. Orville Govern Primary Care Johnita Palleschi: [name] Other Clinician: Referring Clayborn Milnes: Treating Daniela Siebers/Extender: Delora Fuel in Treatment: 0 Clinic Level of Care Assessment Items TOOL 1 Quantity Score X- 1 0 Use when EandM and Procedure is performed on INITIAL visit ASSESSMENTS - Nursing Assessment / Reassessment X- 1 20 General Physical Exam (combine w/ comprehensive assessment (listed just below) when performed on new pt. evals) X- 1 25 Comprehensive Assessment (HX, ROS, Risk Assessments, Wounds Hx, etc.) ASSESSMENTS - Wound and Skin Assessment / Reassessment []  - 0 Dermatologic / Skin Assessment (not related to wound area) ASSESSMENTS - Ostomy and/or Continence Assessment and Care []  - 0 Incontinence Assessment and Management []  - 0 Ostomy Care Assessment and Management (repouching, etc.) PROCESS - Coordination of Care []  - 0 Simple Patient / Family Education for ongoing care []  - 0 Complex (extensive) Patient / Family Education for ongoing care X- 1 10 Staff obtains Chiropractor, Records, T Results / Process Orders est []  - 0 Staff telephones HHA, Nursing Homes / Clarify orders / etc []  - 0 Routine Transfer to another Facility (non-emergent condition) []  - 0 Routine Hospital Admission (non-emergent condition) X- 1 15 New Admissions / Manufacturing engineer / Ordering NPWT Apligraf, etc. , []  - 0 Emergency Hospital Admission (emergent condition) PROCESS - Special  Needs []  - 0 Pediatric / Minor Patient Management []  - 0 Isolation Patient Management []  - 0 Hearing / Language / Visual special needs []  - 0 Assessment of Community assistance (transportation, D/C planning, etc.) []  - 0 Additional assistance / Altered mentation []  - 0 Support Surface(s) Assessment (bed, cushion, seat, etc.) INTERVENTIONS - Miscellaneous []  - 0 External ear exam []  -  0 Patient Transfer (multiple staff / Nurse, adult / Similar devices) []  - 0 Simple Staple / Suture removal (25 or less) []  - 0 Complex Staple / Suture removal (26 or more) []  - 0 Hypo/Hyperglycemic Management (do not check if billed separately) X- 1 15 Ankle / Brachial Index (ABI) - do not check if billed separately Has the patient been seen at the hospital within the last three years: Yes Total Score: 85 Level Of Care: New/Established - Level 3 Electronic Signature(s) Signed: 09/24/2023 5:04:26 PM By: Redmond Pulling RN, BSN Entered By: Redmond Pulling on 09/24/2023 07:11:16 Varney, Latarshia ([id number]) [id number] Page 3 of 8 -------------------------------------------------------------------------------- Encounter Discharge Information Details Patient Name: Date of Service: [name], [name] 09/24/2023 8:00 A M Medical Record Number: [id number] Patient Account Number: [id number] Date of Birth/Sex: Treating RN: 1966/01/20 (57 y.o. Orville Govern Primary Care Damonie Furney: [name] Other Clinician: Referring Keyshawn Hellwig: Treating Sheila Gervasi/Extender: Delora Fuel in Treatment: 0 Encounter Discharge Information Items Post Procedure Vitals Discharge Condition: Stable Temperature (F): 98.1 Ambulatory Status: Ambulatory Pulse (bpm): 108 Discharge Destination: Home Respiratory Rate (breaths/min): 18 Transportation: Private Auto Blood Pressure (mmHg): 123/86 Accompanied By: self Schedule Follow-up Appointment: Yes Clinical Summary of Care: Patient  Declined Electronic Signature(s) Signed: 09/24/2023 5:04:26 PM By: Redmond Pulling RN, BSN Entered By: Redmond Pulling on 09/24/2023 07:35:27 -------------------------------------------------------------------------------- Lower Extremity Assessment Details Patient Name: Date of Service: [name], [name] 09/24/2023 8:00 A M Medical Record Number: [id number] Patient Account Number: [id number] Date of Birth/Sex: Treating RN: 1966/02/08 (57 y.o. [name] Primary Care Ahmari Duerson: [name] Other Clinician: Referring Elicia Lui: Treating Ronnie Doo/Extender: Delora Fuel in Treatment: 0 Edema Assessment Assessed: Kyra Searles: No] Franne Forts: No] [Left: Edema] [Right: :] Calf Left: Right: Point of Measurement: From Medial Instep 52 cm Ankle Left: Right: Point of Measurement: From Medial Instep 29 cm Vascular Assessment Pulses: Dorsalis Pedis Palpable: [Left:Yes] [Right:Yes] Blood Pressure: Brachial: [Right:123] Ankle: [Right:Dorsalis Pedis: 118 0.96] Toe Nail Assessment Left: Right: Thick: Yes Yes Discolored: Yes Yes Improper Length and Hygiene: No No [name], [name] ([id number]) [id number] Page 4 of 8 Electronic Signature(s) Signed: 09/24/2023 5:35:56 PM By: Shawn Stall RN, BSN Entered By: Shawn Stall on 09/24/2023 05:52:44 -------------------------------------------------------------------------------- Multi Wound Chart Details Patient Name: Date of Service: [name], [name] 09/24/2023 8:00 A M Medical Record Number: [id number] Patient Account Number: [id number] Date of Birth/Sex: Treating RN: 03/26/1966 (57 y.o. F) Primary Care Elizbeth Posa: [name] Other Clinician: Referring Ambra Haverstick: Treating Rakia Frayne/Extender: Delora Fuel in Treatment: 0 Vital Signs Height(in): Pulse(bpm): 108 Weight(lbs): Blood Pressure(mmHg): 123/86 Body Mass Index(BMI): Temperature(F): 98.1 Respiratory Rate(breaths/min):  18 [6:Photos: No Photos Right, Plantar Foot Wound Location: Gradually Appeared Wounding Event: Diabetic Wound/Ulcer of the Lower Primary Etiology: Extremity Hypertension, Type II Diabetes, Comorbid History: Osteomyelitis 09/24/2023 Date Acquired: 0 Weeks of Treatment:  Open Wound Status: No Wound Recurrence: 1x1x0.1 Measurements L x W x D (cm) 0.785 A (cm) : rea 0.079 Volume (cm) : Grade 2 Classification: Medium Exudate A mount: Serosanguineous Exudate Type: red, brown Exudate Color: Large (67-100%) Granulation A  mount: Red Granulation Quality: Small (1-33%) Necrotic A mount: Fat Layer (Subcutaneous Tissue): Yes N/A Exposed Structures: Fascia: No Tendon: No Muscle: No Joint: No Bone: No None Epithelialization: Debridement - Selective/Open Wound N/A Debridement:  Pre-procedure Verification/Time Out 09:05 Taken: Callus Tissue Debrided: Skin/Epidermis Level: 0.78 Debridement A (sq cm): rea Curette Instrument: Minimum Bleeding: Pressure Hemostasis A chieved: Procedure was tolerated well Debridement Treatment  Response: 1x1x0.1 Post Debridement Measurements L x W x D (cm)  0.079 Post Debridement Volume: (cm) Callus: Yes Periwound Skin Texture: Debridement Procedures Performed:] [N/A:N/A N/A N/A N/A N/A N/A N/A N/A N/A N/A N/A N/A N/A N/A N/A N/A N/A N/A N/A N/A  N/A N/A N/A N/A N/A N/A N/A N/A N/A N/A N/A N/A] Treatment Notes [name], [name] ([id number]) [id number] Page 5 of 8 Electronic Signature(s) Signed: 09/25/2023 12:11:14 PM By: Baltazar Najjar MD Entered By: Baltazar Najjar on 09/24/2023 06:42:33 -------------------------------------------------------------------------------- Multi-Disciplinary Care Plan Details Patient Name: Date of Service: [name], [name] 09/24/2023 8:00 A M Medical Record Number: [id number] Patient Account Number: [id number] Date of Birth/Sex: Treating RN: 24-Dec-1965 (57 y.o. Orville Govern Primary Care Jeanpaul Biehl: [name] Other Clinician: Referring  Marliss Buttacavoli: Treating Lelia Jons/Extender: Delora Fuel in Treatment: 0 Active Inactive Wound/Skin Impairment Nursing Diagnoses: Impaired tissue integrity Knowledge deficit related to ulceration/compromised skin integrity Goals: Patient/caregiver will verbalize understanding of skin care regimen Date Initiated: 09/24/2023 Target Resolution Date: 11/05/2023 Goal Status: Active Ulcer/skin breakdown will have a volume reduction of 30% by week 4 Date Initiated: 09/24/2023 Target Resolution Date: 10/22/2023 Goal Status: Active Interventions: Assess patient/caregiver ability to obtain necessary supplies Assess patient/caregiver ability to perform ulcer/skin care regimen upon admission and as needed Assess ulceration(s) every visit Provide education on ulcer and skin care Notes: Electronic Signature(s) Signed: 09/24/2023 5:04:26 PM By: Redmond Pulling RN, BSN Entered By: Redmond Pulling on 09/24/2023 06:02:02 -------------------------------------------------------------------------------- Pain Assessment Details Patient Name: Date of Service: [name], [name] 09/24/2023 8:00 A M Medical Record Number: [id number] Patient Account Number: [id number] Date of Birth/Sex: Treating RN: [birth year] (57 y.o. [name] Primary Care Samra Pesch: [name] Other Clinician: Referring Micaela Stith: Treating Alaster Asfaw/Extender: Delora Fuel in Treatment: 0 Active Problems Location of Pain Severity and Description of Pain Patient Has Paino No Site Locations [place], Maurisa ([id number]) [phone].pdf Page 6 of 8 Pain Management and Medication Current Pain Management: Electronic Signature(s) Signed: 09/24/2023 5:35:56 PM By: Shawn Stall RN, BSN Entered By: Shawn Stall on 09/24/2023 05:53:05 -------------------------------------------------------------------------------- Patient/Caregiver Education Details Patient Name: Date of  Service: MAGIN, MICHON 12/18/2024andnbsp8:00 A M Medical Record Number: [id number] Patient Account Number: [id number] Date of Birth/Gender: Treating RN: 1966/02/28 (57 y.o. Orville Govern Primary Care Physician: [name] Other Clinician: Referring Physician: Treating Physician/Extender: Delora Fuel in Treatment: 0 Education Assessment Education Provided To: Patient Education Topics Provided Wound/Skin Impairment: Methods: Explain/Verbal Responses: State content correctly Electronic Signature(s) Signed: 09/24/2023 5:04:26 PM By: Redmond Pulling RN, BSN Entered By: Redmond Pulling on 09/24/2023 06:02:13 -------------------------------------------------------------------------------- Wound Assessment Details Patient Name: Date of Service: [name], [name] 09/24/2023 8:00 A M Medical Record Number: [id number] Patient Account Number: [id number] Date of Birth/Sex: Treating RN: 1966-01-18 (57 y.o. Orville Govern Primary Care Ingri Diemer: [name] Other Clinician: Referring Nicki Furlan: Treating Clements Toro/Extender: Bellatrix, [place], [place] ([id number]) [phone].pdf Page 7 of 8 Weeks in Treatment: 0 Wound Status Wound Number: 6 Primary Etiology: Diabetic Wound/Ulcer of the Lower Extremity Wound Location: Right, Plantar Foot Wound Status: Open Wounding Event: Gradually Appeared Comorbid History: Hypertension, Type II Diabetes, Osteomyelitis Date Acquired: 09/24/2023 Weeks Of Treatment: 0 Clustered Wound: No Wound Measurements Length: (cm) 1 Width: (cm) 1 Depth: (cm) 0.1 Area: (cm) 0.785 Volume: (cm) 0.079 % Reduction in Area: % Reduction in Volume: Epithelialization: None Tunneling: No Undermining: No Wound Description Classification: Grade 2 Exudate Amount: Medium Exudate Type: Serosanguineous Exudate Color: red, brown Foul Odor After Cleansing: No Slough/Fibrino Yes Wound Bed Granulation Amount: Large  (67-100%) Exposed Structure Granulation Quality: Red Fascia Exposed: No Necrotic Amount: Small (  1-33%) Fat Layer (Subcutaneous Tissue) Exposed: Yes Necrotic Quality: Adherent Slough Tendon Exposed: No Muscle Exposed: No Joint Exposed: No Bone Exposed: No Periwound Skin Texture Texture Color No Abnormalities Noted: No No Abnormalities Noted: No Callus: Yes Moisture No Abnormalities Noted: No Treatment Notes Wound #6 (Foot) Wound Laterality: Plantar, Right Cleanser Soap and Water Discharge Instruction: May shower and wash wound with dial antibacterial soap and water prior to dressing change. Vashe 5.8 (oz) Discharge Instruction: Cleanse the wound with Vashe prior to applying a clean dressing using gauze sponges, not tissue or cotton balls. Peri-Wound Care Sween Lotion (Moisturizing lotion) Discharge Instruction: Apply moisturizing lotion as directed Topical Primary Dressing Hydrofera Blue Ready Transfer Foam, 2.5x2.5 (in/in) Discharge Instruction: Apply directly to wound bed as directed Secondary Dressing ALLEVYN Heel 4 1/2in x 5 1/2in / 10.5cm x 13.5cm Discharge Instruction: Apply over primary dressing as directed. ABD Pad, 5x9 Discharge Instruction: Apply over primary dressing as directed. Secured With [employer], 4.5x3.1 (in/yd) Discharge Instruction: Secure with Kerlix as directed. Compression Wrap Compression Stockings [name], [name] ([id number]) [phone].pdf Page 8 of 8 Add-Ons Electronic Signature(s) Signed: 09/24/2023 5:04:26 PM By: Redmond Pulling RN, BSN Entered By: Redmond Pulling on 09/24/2023 06:10:47 -------------------------------------------------------------------------------- Vitals Details Patient Name: Date of Service: [name], [name] 09/24/2023 8:00 A M Medical Record Number: [id number] Patient Account Number: [id number] Date of Birth/Sex: Treating RN: January 20, 1966 (57 y.o. [name] Primary Care Keante Urizar: [name] Other Clinician: Referring Parris Signer: Treating Marek Nghiem/Extender: Delora Fuel in Treatment: 0 Vital Signs Time Taken: 08:40 Temperature (F): 98.1 Pulse (bpm): 108 Respiratory Rate (breaths/min): 18 Blood Pressure (mmHg): 123/86 Reference Range: 80 - 120 mg / dl Electronic Signature(s) Signed: 09/24/2023 5:35:56 PM By: Shawn Stall RN, BSN Entered By: Shawn Stall on 09/24/2023 05:41:20

## 2023-09-26 NOTE — Patient Instructions (Addendum)
Plan: 1. Urine culture.  2. Macrobid (Nitrofurantoin) 100 mg 2x/day x7 days. 3. Urogesic blue as alternative to OTC urinary analgesics (Pyridium / Uricalm). Do not take at same time as your Lexapro and Flexeril. 4. Mycolog cream for topical rash. 5. Start topical vaginal estrogen cream as prescribed. Lidocaine cream 15 minutes prior the topical vaginal estrogen cream if you find that using the estrogen cream stings or burns for the first few days / weeks. 6. Maintain adequate fluid intake daily to flush out the urinary tract. 7. Consider OTC supplements for UTI prevention. 8. Return in about 4 weeks.      Recommendations regarding UTI prevention / management:  Options when UTI symptoms occur: 1. Call [facility] [place] to request urgent / same-day visit (phone # [phone]).  2. Call your Primary Care Provider (PCP) office to request urgent / same-day visit. Be sure to request for urine culture to be ordered and have results faxed to Urology (fax # [phone]).  3. Go to urgent care. Be sure to request for urine culture to be ordered and have results faxed to Urology (fax # [phone]).    Routine use for UTI prevention: - Topical vaginal estrogen for vaginal atrophy. - Adequate daily fluid intake to flush out the urinary tract. - Go to the bathroom to urinate every 4-6 hours while awake to minimize urinary stasis / bacterial overgrowth in the bladder. - Proanthocyanidin (PAC) supplement 36 mg daily; must be soluble (insoluble form of PAC will be ineffective). Recommended brand: Ellura. This is an over-the-counter supplement (often must be found/ purchased online) supplement derived from cranberries with concentrated active component: Proanthocyanidin (PAC) 36 mg daily. Decreases bacterial adherence to bladder lining.  - D-mannose powder (2 grams daily). This is an over-the-counter supplement which decreases bacterial adherence to bladder lining (it is a sugar that  inhibits bacterial adherence to urothelial cells by binding to the pili of enteric bacteria). Take as per manufacturer recommendation. Can be used as an alternative or in addition to the concentrated cranberry supplement.  - Vitamin C supplement to acidify urine to minimize bacterial growth.  - Probiotic to maintain healthy vaginal microbiome to suppress bacteria at urethral opening. Brand recommendations: Darrold Junker (includes probiotic & D-mannose ), Feminine Balance (highest concentration of lactobacillus) or Hyperbiotic Pro 15.  Note for patients with diabetes:  - Be aware that D-mannose contains sugar.        Vulvovaginal atrophy I Genitourinary syndrome of menopause (GSM):  What it is: Changes in the vaginal environment (including the vulva and urethra) including: Thinning of the epithelium (skin/ mucosa surface) Can contribute to urinary urgency and frequency Can contribute to dryness, itching, irritation of the vulvar and vaginal tissue Can contribute to pain with intercourse Can contribute to physical changes of the labia, vulva, and vagina such as: Narrowing of the vaginal opening Decreased vaginal length Loss of labial architecture Labial adhesions Pale color of vulvovaginal tissue Loss of pubic hair Allows bacteria to become adherent  Results in increased risk for urinary tract infection (UTI) due to bacterial overgrowth and migration up the urethra into the bladder Change in vaginal pH (acid/ base balance) Allows for alteration / disruption of the normal bacterial flora / microbiome Results in increased risk for urinary tract infection (UTI) due to bacterial overgrowth  Treatment options: Over-the-counter lubricants (see list below). Prescription vaginal estrogen replacement. Options: Topical vaginal estrogen cream Estrace, Premarin, or compounded estradiol cream/ gel We advise: Discard plastic applicator as that tends to use more  medication than you need, which is not  harmful but wastes / uses up the medication. Also the plastic applicator may cause discomfort. Insert blueberry size amount of medication via the tip of your finger inside vagina nightly for 1 week then 2-3 times per week (long term). Estring vaginal ring Exchanged every 3 months (either at home or in office by provider) Vagifem vaginal tablet Inserted nightly for 2 weeks then twice a week (long term) lntrarosa vaginal suppository Vaginal DHEA: converts to estrogen in vaginal tissue without systemic effect Inserted nightly (long term) Vaginal laser therapy (Mona Lisa touch) Performed in 3 treatments each 6 weeks apart (available in our [place] office). Can feel like a sunburn for 3-4 days after each treatment until new skin heals in. Usually not covered by insurance. Estimated cost is $1500 for all 3 sessions.  FYI regarding prescription vaginal estrogen treatment options: All topical vaginal estrogen replacement options are equivalent in terms of efficacy. Topical vaginal estrogen replacement will take about 3 months to be effective. OK to have sex with any of the topical vaginal estrogen replacement options. Topical vaginal estrogen replacement may sting/burn initially due to severe dryness, which will improve with ongoing treatment. There have been studies that evaluate use of low-dose intravaginal estrogen that show minimal systemic absorption which is negligible after 3 weeks. There have been no studies indicating increased risk of contributing to cancer development or recurrence.  Topical vaginal estrogen cream safe to use with breast cancer history [email or web address]  Topical vaginal estrogen cream safe to use with blood clot  history [email or web address]   Lubricants and Moisturizers for Treating Genitourinary Syndrome of Menopause and Vulvovaginal Atrophy Treatment Comments I Available Products   Lubricants   Water-based Ingredients: Deionized water, glycerin, propylene glycol; latex safe; rare irritation; dry out with extended sexual activity Astroglide, Good Clean Love, K-Y Jelly, Natural, Organic, Pink, Sliquid, Sylk, Yes    Oil Based Ingredients: avocado, olive, peanut, corn; latex safe; can be used with silicone products; staining; safe (unless peanut allergy); non-irritating Coconut oil, vegetable oil, vitamin E oil  Silicone-Based Ingredients: Silicone polymers; staining; typically nonirritating, long lasting; waterproof; should not be used with silicone dilators, sexual toys, or gynecologic products Astroglide X, Oceanus Ultra Pure, Pink Silicone, Pjur Eros, Replens Silky Smooth, Silicone Premium JO, SKYN, Uberlube, [employer] Based Minimize harm to sperm motility; designed Astroglide TTC, Conceive Plus, Pre for couples trying to conceive Seed, Yes Baby  Fertility Friendly Minimize harm to sperm motility; designed Astroglide, TTC, Conceive Plus, Pre for couples trying to conceive Seed, Yes Baby  Vaginal Moisturizers   Vaginal Moisturizers For maintenance use 1 to 3 times weekly; can benefit women with dryness, chafing with AOL, and recurrent vaginal infections irrespective of sexual activity timing Balance Active Menopause Vaginal Moisturizing Lubricant, Canesintima Intimate Moisturizer, Replens, Rephresh, Sylk Natural Intimate Moisturizer, Yes Vaginal Moisturizer  Hybrids Properties of both water and silicone-based products (combination of a vaginal lubricant and moisturizer); Non-irritating; good option for women with allergies and  sensitivities Lubrigyn, Luvena  Suppositories Hyaluronic acid to retain moisture Revaree  Vulvar Soothing Creams/Oils    Medicated CreamsP ain and burn relief; Ingredients: 4% Lidocaine, Aloe Vera gel Releveum ([place]buck)  Non-Medicated Creams For anti-itch and moisture/maintenance; Ingredients: Coconut oil, Avocado oil, Shea Butter, Olive oil, Vitamin E Vajuvenate, Vmagic  Oils !For moisture/maintenance !Coconut oil, Vitamin E oil, Emu oil

## 2023-09-26 NOTE — Progress Notes (Signed)
Pasillas, [name] ([id number]) [id number] Page 1 of 12 Visit Report for 09/24/2023 Debridement Details Patient Name: Date of Service: [name], [name] 09/24/2023 8:00 A M Medical Record Number: [id number] Patient Account Number: [id number] Date of Birth/Sex: Treating RN: Oct 24, 1965 (57 y.o. F) Primary Care Provider: [name] Other Clinician: Referring Provider: Treating Provider/Extender: Delora Fuel in Treatment: 0 Debridement Performed for Assessment: Wound #6 Right,Plantar Foot Performed By: Physician Maxwell Caul., MD The following information was scribed by: Redmond Pulling The information was scribed for: Baltazar Najjar Debridement Type: Debridement Severity of Tissue Pre Debridement: Fat layer exposed Level of Consciousness (Pre-procedure): Awake and Alert Pre-procedure Verification/Time Out Yes - 09:05 Taken: Start Time: 09:10 Percent of Wound Bed Debrided: 100% T Area Debrided (cm): otal 0.78 Tissue and other material debrided: Non-Viable, Callus, Skin: Dermis , Skin: Epidermis Level: Skin/Epidermis Debridement Description: Selective/Open Wound Instrument: Curette Bleeding: Minimum Hemostasis Achieved: Pressure Response to Treatment: Procedure was tolerated well Level of Consciousness (Post- Awake and Alert procedure): Post Debridement Measurements of Total Wound Length: (cm) 1 Width: (cm) 1 Depth: (cm) 0.1 Volume: (cm) 0.079 Character of Wound/Ulcer Post Debridement: Improved Severity of Tissue Post Debridement: Fat layer exposed Post Procedure Diagnosis Same as Pre-procedure Electronic Signature(s) Signed: 09/25/2023 12:11:14 PM By: Baltazar Najjar MD Entered By: Baltazar Najjar on 09/24/2023 09:42:43 -------------------------------------------------------------------------------- HPI Details Patient Name: Date of Service: [name] 09/24/2023 8:00 A M Medical Record Number: [id number] Patient Account  Number: [id number] Date of Birth/Sex: Treating RN: 1966-08-06 (57 y.o. F) Primary Care Provider: [name] Other Clinician: Referring Provider: Treating Provider/Extender: Delora Fuel in Treatment: 0 History of Present Illness [name], [name] ([id number]) [id number] Page 2 of 12 HPI Description: 06-13-2022 upon evaluation today patient presents for evaluation of her right heel ulcer. She is having a tremendous amount of pain at this location. She tells me that her most recent hemoglobin A1c was 8.7 and that was on 08-23-2021. This ulcer on the heel has been present she states since February 2023 when she had an injection to the heel by podiatry. She states that she began to have increasing pain the wound opened and has been open ever since. 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 She had an x-ray in June if anything can although there did not appear to be any obvious evidence of osteomyelitis at that point. She subsequently has had OR debridement several times of the wound performed by podiatry. Patient does have a history of lymphedema of the lower extremities she is also a type II diabetic. 06-19-2022 upon evaluation today patient appears to be doing better in regard to the size of her leg which is significantly improved. With that being said she had a lot of drainage from the heel which is completely understandable considering what is going on at this time. There does not appear to be any evidence of active infection there is no warmth or irritation to the leg in general. With that being said the patient does have an issue here with the amount of drainage that is going on I definitely think Zetuvit is good to be the better way to go in place of ABD pads. Also think that she could potentially benefit from 3 times per week dressing changes but again right now we are going to stick with the to change it today, change in  her Friday, and then subsequently seeing where things stand next Wednesday. The other option would be to change her to a different  day for wound care having her come then say like a Tuesday Friday or Monday Thursday. 06-26-2022 upon evaluation patient's wound bed actually showed signs of doing well in regard to the overall size it was measuring slightly smaller. With that being said unfortunately the biggest issue we see is she still has a tremendous amount of drainage which is what could keep this from being able to use a total contact cast. For that reason I am did going to discuss with her today the possibility of trying Tubigrip to see if that could be of benefit for her. She voiced understanding and is in agreement with giving this a try. 07-03-2022 upon evaluation today patient unfortunately appears to be doing significantly worse at this point in regard to her swelling due to the fact that she was unable to really wear the Tubigrip. She tells me that it was cutting into her. With that being said she also did have an MRI this MRI revealed that she does have osteomyelitis in the heel this was actually just performed Monday, 25 September. This does show signs of "early osteomyelitis". Nonetheless this is still concerning to me. The wound also appears to be getting deeper in the center aspect of this which has me a little concerned as well. I do believe that she is likely going require an aggressive approach here to try to get things better. I discussed that in greater detail with her today. I will detailed in the plan. 07-17-2022 upon evaluation today patient appears to be doing poorly in regard to her wound. Unfortunately she does not show any signs of infection systemically though locally this seems to be doing worse with the wound actually being bigger than where it was previous. She did have an MRI that showed evidence of osteomyelitis actually recommended a referral to ID she was evaluated  infectious disease and they recommended referral to podiatry and to be honest have recommended amputation based on what the patient tells me today. With that being said this is something that she is not interested in at all to be perfectly honest. For that reason she wants to know what she should do and where she should go at this point that is where the majority of the conversation which was quite lengthy today went. Obviously understand her concern here but I think she is going to have to definitely get off this and likely this means she is going to need to come out of work. 10/18; Since the patient was the patient is using Hydrofera Blue on her right heel wound. She has a new larger open area superior to the wound. The skin on the bottom of her foot is completely macerated. I reviewed the infectious disease note on this patient from 10/9. They recommended keeping the patient on Delofloxacin 450 twice daily until follow-up tomorrow. The patient states she is not going back there as the only thing they wanted to do was "cut my leg off]. She has not seen podiatry. Her lab works shows a CRP markedly elevated at 240.2 a sedimentation rate of 96 the rest of her CBC is normal creatinine of 0.38 The patient has been to see her primary doctor who is Dr. Leonides Schanz [[facility]] In [place]. According the patient Dr. Loralee Pacas has ordered vancomycin and Zosyn to start on November 1. She is not taking the delofloxacin that was suggested by infectious disease. Very angry that they just wanted to consider her for an amputation although I do not specifically see that  stated in their note 07-31-2022 upon evaluation today patient still has a significant wound over her plantar aspect of her right l foot region. With that being said I am definitely concerned about the fact that the patient probably does need to be in hyperbaric oxygen therapy at this point. I think we should try to get this going as quickly as possible. She actually  is going to be set up with a PICC line next Wednesday and then following this we will have the vancomycin and Zosyn that she will be taking for 8 weeks according to what she tells me. I think this is definitely going to be beneficial and again the goal here is limb salvage. In combination with that I think the hyperbaric oxygen therapy would be ultimately significantly helpful for her. 08-07-2022 upon evaluation today patient appears to still be doing quite poorly overall she still has not gotten her antibiotics. She got her PICC line today which I assumed she will be getting the first dose of antibiotics as well during that time. With that being said unfortunately she did not get the antibiotics and in fact as I questioned her further she does not even know when or if that is going to be started this week. Again I am very concerned about this considering she already has a PICC line in place we do not want this to clot off on top of the fact that she should already be on these antibiotics she just never went to the ER for evaluation therefore they never got this started. At this point based on what I am seeing I really think that the ideal thing would be for [place] to see about getting her to the ER for further evaluation and treatment to see if they can get this started for her as soon as possible. The patient voiced understanding and is in agreement with the plan. I gave her recommendations for what should be done also called [facility] infusion therapy in order to see if they would be the ones that seem to be available for her IV infusions but they tell me that they no longer do this that is apparently where the orders were sent to for her infusion therapy. That was by her primary care provider. 08-21-2022 upon evaluation today patient's wound on the bottom of the heel and foot area as well as the toe appear to be doing significantly better. Fortunately I do not see any evidence of infection locally or  systemically and everything is measuring much smaller than where we have been. The wound in the left gluteal region also is dramatically improved compared to last time she was here. She has started the antibiotics this is cefepime and vancomycin. She did have a somewhat high trough and therefore they have her hold that today and they are to recheck in the morning I believe she told me. Nonetheless with the antibiotics going she seems to be doing significantly better which is great news. 08-28-2022 upon evaluation today patient appears to be doing somewhat better in regard to her foot there is definitely less drainage than what we have seen in the past. I do believe the antibiotics are helping. With that being said she is also been approved for hyperbaric oxygen therapy and I think as soon as we get this started the better. I discussed that with her today as well. With that being said I do think that the last thing she needs is her chest x-ray when she has that  done will be ready to start her into treatment. She is going to try to go get that today when she leaves which I think would be ideal. 09-11-2022 upon evaluation today patient appears to be doing well with regard to her foot ulcer. This is showing signs of no digression and she does not appear to be having as much drainage as before though it still very wet is not nearly the extreme of what it was previous. Fortunately there does not appear to be any signs of active infection locally nor systemically at this point which is great news. Patient did have her chest x-ray as well as EKG and she is actually approved and completely cleared for HBO therapy from both a clinical standpoint as well as an insurance standpoint. 09-25-2022 upon evaluation today patient appears to be doing well in regard to her foot ulcer. Unfortunately she is not doing as well when it comes to her hyperbaric treatments. She is having a lot of trouble with claustrophobia. She  tells me in general that she is not sure she is good to be able to continue. 10-09-2022 upon evaluation today patient's wounds actually are showing signs of excellent improvement which is great news. Fortunately I do not see any evidence of infection locally or systemically which is great news and overall I am extremely pleased with where things are currently. I do think that she is making good progress she does tell me at this point today she does not want to go forward with a hyperbaric oxygen therapy. 10-16-2022 upon evaluation today patient appears to be doing excellent in regard to her foot ulcer. This is actually showing signs of excellent improvement. She still does not allow for any sharp debridement but the good news is this is doing much better and does not even need it at the moment. Nonetheless as far as sharp debridement is concerned this has been too painful for really not been able to do that all along. [name], [name] ([id number]) [id number] Page 3 of 12 10-23-2022 upon evaluation today patient actually is making excellent progress. I am extremely pleased with where we stand I have considered even seeing about putting her in the total contact cast but to be honest she is doing so well currently and still with having some drainage I feel like it is better for her to be able to change it then to be locked up in the cast for a week at a time. She has voiced understanding as well. Overall though I feel like you are making some really good progress here. 10-30-2022 upon evaluation today patient appears to be doing excellent in regard to her foot ulcers. Both are showing signs of improvement which is great news and overall I feel like that they are measuring smaller, looking better, and I see no signs of resurgence of the infection which is great news. Overall I am extremely pleased at this point. 2/7; the patient has 2 wounds which are remanence of the large wound on the  right foot. This was a diabetic wound with underlying osteomyelitis she has completed antibiotics. Today she has the open area on the distal calcaneus and an area on the right midfoot which is eschared. She has been using silver alginate and offloading this in a regular running shoe. 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 11-27-2022 upon evaluation today patient appears to be doing well currently in regard to her  wound which is actually showing signs of significant improvement this is great news. Fortunately there does not appear to be any signs of active infection locally nor systemically which is also excellent. No fevers, chills, nausea, vomiting, or diarrhea. 12-25-2022 upon evaluation today patient appears to be doing well currently in regard to the distal portion of her foot which is actually in my opinion looking a little better but have a lot of callus she is going require some debridement here. With that being said the heel actually looks like it might be a little bit worse. There is a lot of callus I cannot tell exactly what is open or not we can remove that and we will see what exactly were dealing with there. With that being said she also has an area on the right side of her leg which appears to be more of a pressure injury she tells me this is from sitting in her recliner she has gotten a pillow to help take care of this. Fortunately it is not technically open and draining at this point made it very well may be before it is also not done. 01-01-2023 upon evaluation today patient appears to be doing well currently in regard to her wounds I feel like the cefdinir has helped she seems to be improved compared to last week's evaluation. I do not see any signs of active infection locally nor systemically at this time which is great news. No fevers, chills, nausea, vomiting, or diarrhea. 01-08-2023 upon evaluation today  patient's wounds on the foot appear to be doing decently well the leg is still about the same. With that being said unfortunately she does have a lot of erythema on the leg in particular which is unfortunate and definitely not what we are looking for. Again I do not think that the [facility] has really done well for her in that regard. 01-15-2023 upon evaluation today patient appears to be doing well currently in regard to her wound. Overall I think that things are measuring a little bit better but unfortunately the erythema is spreading. I did review her culture today. Unfortunately it does show that she does have MRSA noted and again I think that we need to do something to try to get this under control as quickly as possible. She has previously been on IV antibiotics and unfortunately that has not been sufficient to get this completely cleared it seems to have come back at this point and she really wants to avoid going back on IV antibiotics. We have been using oral antibiotics and various forms the most recent was actually a course of cefdinir. She is also previously been on clindamycin and even in the past she has had to doxycycline. With that being said with all things considered I think we are still having a lot of trouble here keeping this infection under control. We previously attempted hyperbarics but the patient did not tolerate this well. She therefore discontinued HBO therapy. 01-22-2023 upon evaluation today patient appears to be doing a little better in regard to the cellulitis on her right leg. I am much more pleased with what I am seeing today. With that being said unfortunately she is still having quite a bit of issues here with erythema though not as bad I think that were not completely clear. Unfortunately we were denied for the [facility] prescription. She has the medication but I provided for her by way of samples but nothing further. 01-29-2023 upon evaluation today patient appears to be  doing poorly currently in regard to her leg. I do not feel like she is showing as much improvement as she was within desirable now that we switch her to linezolid. With that being said I do believe that we probably need to see about IV antibiotics I recommended a referral back to ID. With that being said she would prefer to see her physician which I am okay with but either way I think she needs to be back on IV antibiotics for a minimum of 2 months as soon as possible. I am going to send her for lab work today in order to get some baseline labs but honestly I think that we are going to require IV antibiotics to get this under control. 02-12-2023 upon evaluation today patient appears to be doing well currently in regard to her wound all things considered. She has not gotten the lab work done yet that we ordered a couple weeks back. She tells me she has had a lot going on with the death of the family. Fortunately there does not appear to be any signs of worsening in fact some of the heel looks a little bit better which is great news. Fortunately I do not see any evidence of active infection locally nor systemically which is great news. 02-19-24 upon evaluation today patient appears to be doing well currently in regard to her wounds. She has been tolerating the dressing changes without complication. Fortunately there does not appear to be any signs of active infection locally nor systemically which is great news. No fevers, chills, nausea, vomiting, or diarrhea. She is currently on vancomycin and Zosyn through IV which is doing I think well she has been on it for not quite a week at this point. 03-05-2023 upon evaluation today patient appears to be doing well currently in regard to her wound. She has been tolerating the dressing changes all 3 locations actually are significantly smaller even compared to last week I am very pleased with where we stand. I think the antibiotics are really doing a good job here  for her. 03-12-2023 upon evaluation today patient has actually continued to make excellent progress in regard to her wounds. I am very pleased with where we stand in fact I think her leg is healed the middle part of her foot I think is close to being closed and the heel does look better though there is some callus that needs to be removed at this point. I did discuss that with her today. 03-19-2023 upon evaluation today patient's wounds are actually showing signs of excellent improvement. I am actually very pleased with where we stand and I think that she is making really good progress here. I do not see any signs of active infection locally nor systemically which is great news. 03-26-2023 upon evaluation today patient's wounds actually are showing signs of improvement in fact the middle of her foot is pretty much appearing to be completely healed which is great news. This means we are just dealing with the heel itself on the bottom of the foot as far as anything remaining is concerned at this point. I am extremely pleased with where we stand at this time. 04-09-2023 upon evaluation today patient appears to be doing well currently in regard to the periwound. She has been tolerating the dressing changes without complication. Fortunately there does not appear to be any signs of active infection locally or systemically at this time. Potentially seeing about getting a total contact cast on and try  to get this closed as quickly as possible. I think the casting is probably our best bet at this point and the patient is in agreement with that plan. At this point based on what I am seeing I do believe that the patient would benefit from a continuation of care with regard to dressing changes regularly. 04-16-2023 upon evaluation today patient appears to be doing well currently in regard to her wound which is actually showing signs of good improvement. Fortunately I do not see any evidence of active infection locally  or systemically which is great news and in general I do believe that we are making good headway towards complete closure. I do think she is a candidate however for total contact cast which I think will speed up the closure process. 7/12; patient presents for follow-up. She is here for our obligatory cast change. She had the cast first placed two days ago and Hydrofera Blue was under the cast. She has no issues or complaints today. 04-23-2023 upon evaluation today patient appears to be doing well currently in regard to her wound. She is actually showing signs of excellent improvement and in general I feel like that we are making great headway towards closure. Her wound is significantly improved compared to last week when I saw her the cast is doing great despite the fact got so loose that today the cast actually slipped off without [place] even having to cut it with a cast saw. Obviously she is lost quite a bit of fluid in fact we did for inch cast last time she is definitely going to be in the 3 today she has lost a tremendous amount of fluid in this leg which is actually really good news. I do not see any signs of active infection locally nor systemically at this point. [name], [name] ([id number]) [id number] Page 4 of 12 7/24; patient came in today with a size 3 total contact cast quite loose in fact it came off without being manually cut off. This is no doubt due to reduction in the amount of edema in her right lower leg. This should not be an ongoing issue. The wound is on the plantar right heel it was originally due to a injection she had for plantar fasciitis according to the patient. Her wound has been doing well 05-07-2023 upon evaluation today patient appears to be doing well currently in regard to her wound. She has been tolerating the dressing changes without complication. Fortunately I do not see any signs of active infection locally nor systemically which is great  news. 05-21-2023 upon evaluation today patient appears to be doing well currently in regard to her wound on the heel. She has been tolerating the dressing changes without complication. Fortunately this is definitely getting smaller and looking much better and very pleased at this point. I do not see any signs of active infection locally nor systemically at this time. 05-28-2023 upon evaluation today patient appears to be doing well currently in regard to her wound which actually showing signs of excellent improvement. I am very pleased with where we stand I do believe that she is making excellent progress towards complete closure which is great news. No fevers, chills, nausea, vomiting, or diarrhea. The casting is doing very well and hopeful that we can get this done before we and then run out of cast. The good news that she is able to wear the size for which we have more of the in stock in [place]. 06-11-2023 upon evaluation  today patient's wound actually appears to be doing excellent infection is almost completely healed. Fortunately I do not see any signs of significant openings she has just a little bit of callus other than that everything looks to be doing excellent. The biggest issue that I see right now is simply that of having issues with her leg where she does have cellulitis noted I am going to go ahead and send in a prescription for the linezolid to try to get this under control as quickly as possible the foot seems to be doing great though I do not think that this is a deeper infection of the bone. 06-25-2023 upon evaluation today patient appears to be doing well currently in regard to her wound. She has been tolerating the dressing changes without complication. Fortunately there does not appear to be any signs of active infection locally or systemically which is great news. No fevers, chills, nausea, vomiting, or diarrhea. READMISSION 09/24/2023 This is a 57 year old woman with type 2  diabetes. She spent a protracted time in this clinic from September 2023 through September 2024 with a substantial wound on the right plantar foot. There was underlying osteomyelitis. She was given antibiotics and I think had some treatments with hyperbaric oxygen. She saw an MRI showed soft tissue inflammation extending to the plantar and dorsal surface of the calcaneus with probable early osteomyelitis in 07/01/2022. She was treated for a prolonged period of time with our usual dressings and a total contact cast and eventually healed out in September. She does not have a significant arterial issue on the right. She comes in today telling us that a few weeks ago she noticed some leakage coming out of her right heel. This seems to have callused over and she has not seen it recently. She has trouble looking at the bottom of her feet but she does live with her son and daughter. 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 She is using new balance shoes but without a specific diabetic shoe or insert Electronic Signature(s) Signed: 09/25/2023 12:11:14 PM By: Baltazar Najjar MD Entered By: Baltazar Najjar on 09/24/2023 09:46:36 -------------------------------------------------------------------------------- Physical Exam Details Patient Name: Date of Service: [name], [name] 09/24/2023 8:00 A M Medical Record Number: [id number] Patient Account Number: [id number] Date of Birth/Sex: Treating RN: May 04, 1966 (57 y.o. F) Primary Care Provider: [name] Other Clinician: Referring Provider: Treating Provider/Extender: Delora Fuel in Treatment: 0 Constitutional Sitting or standing Blood Pressure is within target range for patient.. Pulse regular and within target range for patient.Marland Kitchen Respirations regular, non-labored and within target range.. Temperature is normal and within the target range for  the patient.Marland Kitchen Appears in no distress. Cardiovascular Dorsalis pedis pulses palpable on the right. Her foot is warm.. Uncontrolled lymphedema in both legs. She is not using compression stockings that were suggested for her. Notes Wound exam; the patient had a literal lawn of callus over her plantar heel. I painstakingly removed this with a #5 curette and was able to identify a small area did seem to be open presumably the cause of the drainage she knows noticed. This is not probe deeply in fact is limited to skin level. There is no evidence of surrounding infection Electronic Signature(s) Signed: 09/25/2023 12:11:14 PM By: Baltazar Najjar MD Entered By: Baltazar Najjar on 09/24/2023 09:48:38 [name], [name] ([id number]) [id number] Page 5 of 12 -------------------------------------------------------------------------------- Physician Orders Details Patient Name:  Date of Service: [name], [name] 09/24/2023 8:00 A M Medical Record Number: [id number] Patient Account Number: [id number] Date of Birth/Sex: Treating RN: [birth year] (57 y.o. Orville Govern Primary Care Provider: [name] Other Clinician: Referring Provider: Treating Provider/Extender: Delora Fuel in Treatment: 0 Verbal / Phone Orders: No Diagnosis Coding Follow-up Appointments ppointment in 2 weeks. - Dr Leanord Hawking THursday 10/08/22 @ 0800 Rm 9 Return A Bathing/ Shower/ Hygiene May shower and wash wound with soap and water. Edema Control - Orders / Instructions Bilateral Lower Extremities Moisturize legs daily. Off-Loading Heel suspension boot to: - right foot Wound Treatment Wound #6 - Foot Wound Laterality: Plantar, Right Cleanser: Soap and Water 1 x Per Day/30 Days Discharge Instructions: May shower and wash wound with dial antibacterial soap and water prior to dressing change. Cleanser: Vashe 5.8 (oz) 1 x Per Day/30 Days Discharge Instructions: Cleanse the wound with  Vashe prior to applying a clean dressing using gauze sponges, not tissue or cotton balls. Peri-Wound Care: Sween Lotion (Moisturizing lotion) 1 x Per Day/30 Days Discharge Instructions: Apply moisturizing lotion as directed Prim Dressing: Hydrofera Blue Ready Transfer Foam, 2.5x2.5 (in/in) 1 x Per Day/30 Days ary Discharge Instructions: Apply directly to wound bed as directed Secondary Dressing: ALLEVYN Heel 4 1/2in x 5 1/2in / 10.5cm x 13.5cm 1 x Per Day/30 Days Discharge Instructions: Apply over primary dressing as directed. Secondary Dressing: ABD Pad, 5x9 1 x Per Day/30 Days Discharge Instructions: Apply over primary dressing as directed. Secured With: [employer], 4.5x3.1 (in/yd) 1 x Per Day/30 Days Discharge Instructions: Secure with Kerlix as directed. Electronic Signature(s) Signed: 09/24/2023 5:04:26 PM By: Redmond Pulling RN, BSN Signed: 09/25/2023 12:11:14 PM By: Baltazar Najjar MD Entered By: Redmond Pulling on 09/24/2023 09:24:11 -------------------------------------------------------------------------------- Problem List Details Patient Name: Date of Service: [name], [name] 09/24/2023 8:00 A M Medical Record Number: [id number] Patient Account Number: [id number] Date of Birth/Sex: Treating RN: 29-Mar-1966 (57 y.o. 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 F) Primary Care Provider: [name] Other Clinician: Referring Provider: Treating Provider/Extender: Delora Fuel in Treatment: 0 Subjective History of Present Illness (HPI) 06-13-2022 upon evaluation today patient presents for evaluation of her right heel ulcer. She is having a tremendous amount of pain at this location. She tells me that her most recent hemoglobin A1c was 8.7 and that was on 08-23-2021. This ulcer on the heel has been present she states since February 2023 when she had an injection to the heel by podiatry. She states that she began to have increasing pain the wound opened and has been open ever since. 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 She had an x-ray in June if anything can although there did not appear to be any obvious evidence of  osteomyelitis at that point. She subsequently has had OR debridement several times of the wound performed by podiatry. Patient does have a history of lymphedema of the lower extremities she is also a type II diabetic. 06-19-2022 upon evaluation today patient appears to be doing better in regard to the size of her leg which is significantly improved. With that being said she had a lot of drainage from the heel which is completely understandable considering what is going on at this time. There does not appear to be any evidence of active infection there is no warmth or irritation to the leg in general. With that being said the patient does have an issue here with the amount of drainage that is going on I definitely think Zetuvit is  good to be the better way to go in place of ABD pads. Also think that she could potentially benefit from 3 times per week dressing changes but again right now we are going to stick with the to change it today, change in her Friday, and then subsequently seeing where things stand next Wednesday. The other option would be to change her to a different day for wound care having her come then say like a Tuesday Friday or Monday Thursday. 06-26-2022 upon evaluation patient's wound bed actually showed signs of doing well in regard to the overall size it was measuring slightly smaller. With that being said unfortunately the biggest issue we see is she still has a tremendous amount of drainage which is what could keep this from being able to use a total contact cast. For that reason I am did going to discuss with her today the possibility of trying Tubigrip to see if that could be of benefit for her. She voiced understanding and is in agreement with giving this a try. 07-03-2022 upon evaluation today patient unfortunately appears to be doing significantly worse at this point in regard to her swelling due to the fact that she was unable to really wear the Tubigrip. She tells me that it was cutting into her. With that being said she also did have an MRI this MRI revealed that she does have osteomyelitis in the heel this was actually just performed Monday, 25 September. This does show signs of "early osteomyelitis". Nonetheless this is still concerning to me. The wound also appears to be getting deeper in the center aspect of this which has me a little concerned as well. I do believe that she is likely going require an aggressive approach here to try to get things better. I discussed that in greater detail with her today. I will detailed in the plan. 07-17-2022 upon evaluation today patient appears to be doing poorly in regard to her wound. Unfortunately she does not show any signs of infection systemically though  locally this seems to be doing worse with the wound actually being bigger than where it was previous. She did have an MRI that showed evidence of osteomyelitis actually recommended a referral to ID she was evaluated infectious disease and they recommended referral to podiatry and to be Pat, Siyana ([id number]) [id number] Page 7 of 12 honest have recommended amputation based on what the patient tells me today. With that being said this is something that she is not interested in at all to be perfectly honest. For that reason she wants to know what she should do and where she should go at this point that is where the majority of the conversation which was quite lengthy today went. Obviously understand her  concern here but I think she is going to have to definitely get off this and likely this means she is going to need to come out of work. 10/18; Since the patient was the patient is using Hydrofera Blue on her right heel wound. She has a new larger open area superior to the wound. The skin on the bottom of her foot is completely macerated. I reviewed the infectious disease note on this patient from 10/9. They recommended keeping the patient on Delofloxacin 450 twice daily until follow-up tomorrow. The patient states she is not going back there as the only thing they wanted to do was "cut my leg off]. She has not seen podiatry. Her lab works shows a CRP markedly elevated at 240.2 a sedimentation rate of 96 the rest of her CBC is normal creatinine of 0.38 The patient has been to see her primary doctor who is Dr. Leonides Schanz [[facility]] In [place]. According the patient Dr. Loralee Pacas has ordered vancomycin and Zosyn to start on November 1. She is not taking the delofloxacin that was suggested by infectious disease. Very angry that they just wanted to consider her for an amputation although I do not specifically see that stated in their note 07-31-2022 upon evaluation today patient still has a  significant wound over her plantar aspect of her right l foot region. With that being said I am definitely concerned about the fact that the patient probably does need to be in hyperbaric oxygen therapy at this point. I think we should try to get this going as quickly as possible. She actually is going to be set up with a PICC line next Wednesday and then following this we will have the vancomycin and Zosyn that she will be taking for 8 weeks according to what she tells me. I think this is definitely going to be beneficial and again the goal here is limb salvage. In combination with that I think the hyperbaric oxygen therapy would be ultimately significantly helpful for her. 08-07-2022 upon evaluation today patient appears to still be doing quite poorly overall she still has not gotten her antibiotics. She got her PICC line today which I assumed she will be getting the first dose of antibiotics as well during that time. With that being said unfortunately she did not get the antibiotics and in fact as I questioned her further she does not even know when or if that is going to be started this week. Again I am very concerned about this considering she already has a PICC line in place we do not want this to clot off on top of the fact that she should already be on these antibiotics she just never went to the ER for evaluation therefore they never got this started. At this point based on what I am seeing I really think that the ideal thing would be for [place] to see about getting her to the ER for further evaluation and treatment to see if they can get this started for her as soon as possible. The patient voiced understanding and is in agreement with the plan. I gave her recommendations for what should be done also called [facility] infusion therapy in order to see if they would be the ones that seem to be available for her IV infusions but they tell me that they no longer do this that is apparently where the orders  were sent to for her infusion therapy. That was by her primary care provider. 08-21-2022 upon evaluation today patient's wound  on the bottom of the heel and foot area as well as the toe appear to be doing significantly better. Fortunately I do not see any evidence of infection locally or systemically and everything is measuring much smaller than where we have been. The wound in the left gluteal region also is dramatically improved compared to last time she was here. She has started the antibiotics this is cefepime and vancomycin. She did have a somewhat high trough and therefore they have her hold that today and they are to recheck in the morning I believe she told me. Nonetheless with the antibiotics going she seems to be doing significantly better which is great news. 08-28-2022 upon evaluation today patient appears to be doing somewhat better in regard to her foot there is definitely less drainage than what we have seen in the past. I do believe the antibiotics are helping. With that being said she is also been approved for hyperbaric oxygen therapy and I think as soon as we get this started the better. I discussed that with her today as well. With that being said I do think that the last thing she needs is her chest x-ray when she has that done will be ready to start her into treatment. She is going to try to go get that today when she leaves which I think would be ideal. 09-11-2022 upon evaluation today patient appears to be doing well with regard to her foot ulcer. This is showing signs of no digression and she does not appear to be having as much drainage as before though it still very wet is not nearly the extreme of what it was previous. Fortunately there does not appear to be any signs of active infection locally nor systemically at this point which is great news. Patient did have her chest x-ray as well as EKG and she is actually approved and completely cleared for HBO therapy from both a  clinical standpoint as well as an insurance standpoint. 09-25-2022 upon evaluation today patient appears to be doing well in regard to her foot ulcer. Unfortunately she is not doing as well when it comes to her hyperbaric treatments. She is having a lot of trouble with claustrophobia. She tells me in general that she is not sure she is good to be able to continue. 10-09-2022 upon evaluation today patient's wounds actually are showing signs of excellent improvement which is great news. Fortunately I do not see any evidence of infection locally or systemically which is great news and overall I am extremely pleased with where things are currently. I do think that she is making good progress she does tell me at this point today she does not want to go forward with a hyperbaric oxygen therapy. 10-16-2022 upon evaluation today patient appears to be doing excellent in regard to her foot ulcer. This is actually showing signs of excellent improvement. She still does not allow for any sharp debridement but the good news is this is doing much better and does not even need it at the moment. Nonetheless as far as sharp debridement is concerned this has been too painful for really not been able to do that all along. 10-23-2022 upon evaluation today patient actually is making excellent progress. I am extremely pleased with where we stand I have considered even seeing about putting her in the total contact cast but to be honest she is doing so well currently and still with having some drainage I feel like it is better for her to  be able to change it then to be locked up in the cast for a week at a time. She has voiced understanding as well. Overall though I feel like you are making some really good progress here. 10-30-2022 upon evaluation today patient appears to be doing excellent in regard to her foot ulcers. Both are showing signs of improvement which is great news and overall I feel like that they are measuring  smaller, looking better, and I see no signs of resurgence of the infection which is great news. Overall I am extremely pleased at this point. 2/7; the patient has 2 wounds which are remanence of the large wound on the right foot. This was a diabetic wound with underlying osteomyelitis she has completed antibiotics. Today she has the open area on the distal calcaneus and an area on the right midfoot which is eschared. She has been using silver alginate and offloading this in a regular running shoe. 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 11-27-2022 upon evaluation today patient appears to be doing well currently in regard to her wound which is actually showing signs of significant improvement this is great news. Fortunately there does not appear to be any signs of active infection locally nor systemically which is also excellent. No fevers, chills, nausea, vomiting, or diarrhea. 12-25-2022 upon evaluation today patient appears to be doing well currently in regard to the distal portion of her foot which is actually in my opinion looking a little better but have a lot of callus she is going require some debridement here. With that being said the heel actually looks like it might be a little bit worse. There is a lot of callus I cannot tell exactly what is open or not we can remove that and we will see what exactly were dealing with there. With that being said she also has an area on the right side of her leg which appears to be more of a pressure injury she tells me this is from sitting in her recliner she has gotten a pillow to help take care of this. Fortunately it is not technically open and draining at this point made it very well may be before it is also not done. 01-01-2023 upon evaluation today patient appears to be doing well currently in regard to her wounds I feel like the cefdinir has helped she seems to be improved compared  to last week's evaluation. I do not see any signs of active infection locally nor systemically at this time which is great news. No fevers, chills, nausea, vomiting, or diarrhea. 01-08-2023 upon evaluation today patient's wounds on the foot appear to be doing decently well the leg is still about the same. With that being said unfortunately she does have a lot of erythema on the leg in particular which is unfortunate and definitely not what we are looking for. Again I do not think that the [facility] has really done well for her in that regard. [name], [name] ([id number]) [id number] Page 8 of 12 01-15-2023 upon evaluation today patient appears to be doing well currently in regard to her wound. Overall I think that things are measuring a little bit better but unfortunately the erythema is spreading. I did review her culture today. Unfortunately it does show that she does have MRSA noted and again I think that we need to do something to try to get this under control as quickly as possible. She has  previously been on IV antibiotics and unfortunately that has not been sufficient to get this completely cleared it seems to have come back at this point and she really wants to avoid going back on IV antibiotics. We have been using oral antibiotics and various forms the most recent was actually a course of cefdinir. She is also previously been on clindamycin and even in the past she has had to doxycycline. With that being said with all things considered I think we are still having a lot of trouble here keeping this infection under control. We previously attempted hyperbarics but the patient did not tolerate this well. She therefore discontinued HBO therapy. 01-22-2023 upon evaluation today patient appears to be doing a little better in regard to the cellulitis on her right leg. I am much more pleased with what I am seeing today. With that being said unfortunately she is still having quite a  bit of issues here with erythema though not as bad I think that were not completely clear. Unfortunately we were denied for the [facility] prescription. She has the medication but I provided for her by way of samples but nothing further. 01-29-2023 upon evaluation today patient appears to be doing poorly currently in regard to her leg. I do not feel like she is showing as much improvement as she was within desirable now that we switch her to linezolid. With that being said I do believe that we probably need to see about IV antibiotics I recommended a referral back to ID. With that being said she would prefer to see her physician which I am okay with but either way I think she needs to be back on IV antibiotics for a minimum of 2 months as soon as possible. I am going to send her for lab work today in order to get some baseline labs but honestly I think that we are going to require IV antibiotics to get this under control. 02-12-2023 upon evaluation today patient appears to be doing well currently in regard to her wound all things considered. She has not gotten the lab work done yet that we ordered a couple weeks back. She tells me she has had a lot going on with the death of the family. Fortunately there does not appear to be any signs of worsening in fact some of the heel looks a little bit better which is great news. Fortunately I do not see any evidence of active infection locally nor systemically which is great news. 02-19-24 upon evaluation today patient appears to be doing well currently in regard to her wounds. She has been tolerating the dressing changes without complication. Fortunately there does not appear to be any signs of active infection locally nor systemically which is great news. No fevers, chills, nausea, vomiting, or diarrhea. She is currently on vancomycin and Zosyn through IV which is doing I think well she has been on it for not quite a week at this point. 03-05-2023 upon evaluation today  patient appears to be doing well currently in regard to her wound. She has been tolerating the dressing changes all 3 locations actually are significantly smaller even compared to last week I am very pleased with where we stand. I think the antibiotics are really doing a good job here for her. 03-12-2023 upon evaluation today patient has actually continued to make excellent progress in regard to her wounds. I am very pleased with where we stand in fact I think her leg is healed the middle part of  her foot I think is close to being closed and the heel does look better though there is some callus that needs to be removed at this point. I did discuss that with her today. 03-19-2023 upon evaluation today patient's wounds are actually showing signs of excellent improvement. I am actually very pleased with where we stand and I think that she is making really good progress here. I do not see any signs of active infection locally nor systemically which is great news. 03-26-2023 upon evaluation today patient's wounds actually are showing signs of improvement in fact the middle of her foot is pretty much appearing to be completely healed which is great news. This means we are just dealing with the heel itself on the bottom of the foot as far as anything remaining is concerned at this point. I am extremely pleased with where we stand at this time. 04-09-2023 upon evaluation today patient appears to be doing well currently in regard to the periwound. She has been tolerating the dressing changes without complication. Fortunately there does not appear to be any signs of active infection locally or systemically at this time. Potentially seeing about getting a total contact cast on and try to get this closed as quickly as possible. I think the casting is probably our best bet at this point and the patient is in agreement with that plan. At this point based on what I am seeing I do believe that the patient would benefit from  a continuation of care with regard to dressing changes regularly. 04-16-2023 upon evaluation today patient appears to be doing well currently in regard to her wound which is actually showing signs of good improvement. Fortunately I do not see any evidence of active infection locally or systemically which is great news and in general I do believe that we are making good headway towards complete closure. I do think she is a candidate however for total contact cast which I think will speed up the closure process. 7/12; patient presents for follow-up. She is here for our obligatory cast change. She had the cast first placed two days ago and Hydrofera Blue was under the cast. She has no issues or complaints today. 04-23-2023 upon evaluation today patient appears to be doing well currently in regard to her wound. She is actually showing signs of excellent improvement and in general I feel like that we are making great headway towards closure. Her wound is significantly improved compared to last week when I saw her the cast is doing great despite the fact got so loose that today the cast actually slipped off without [place] even having to cut it with a cast saw. Obviously she is lost quite a bit of fluid in fact we did for inch cast last time she is definitely going to be in the 3 today she has lost a tremendous amount of fluid in this leg which is actually really good news. I do not see any signs of active infection locally nor systemically at this point. 7/24; patient came in today with a size 3 total contact cast quite loose in fact it came off without being manually cut off. This is no doubt due to reduction in the amount of edema in her right lower leg. This should not be an ongoing issue. The wound is on the plantar right heel it was originally due to a injection she had for plantar fasciitis according to the patient. Her wound has been doing well 05-07-2023 upon evaluation today patient  appears to be doing  well currently in regard to her wound. She has been tolerating the dressing changes without complication. Fortunately I do not see any signs of active infection locally nor systemically which is great news. 05-21-2023 upon evaluation today patient appears to be doing well currently in regard to her wound on the heel. She has been tolerating the dressing changes without complication. Fortunately this is definitely getting smaller and looking much better and very pleased at this point. I do not see any signs of active infection locally nor systemically at this time. 05-28-2023 upon evaluation today patient appears to be doing well currently in regard to her wound which actually showing signs of excellent improvement. I am very pleased with where we stand I do believe that she is making excellent progress towards complete closure which is great news. No fevers, chills, nausea, vomiting, or diarrhea. The casting is doing very well and hopeful that we can get this done before we and then run out of cast. The good news that she is able to wear the size for which we have more of the in stock in [place]. 06-11-2023 upon evaluation today patient's wound actually appears to be doing excellent infection is almost completely healed. Fortunately I do not see any signs of significant openings she has just a little bit of callus other than that everything looks to be doing excellent. The biggest issue that I see right now is simply that of having issues with her leg where she does have cellulitis noted I am going to go ahead and send in a prescription for the linezolid to try to get this under control as quickly as possible the foot seems to be doing great though I do not think that this is a deeper infection of the bone. 06-25-2023 upon evaluation today patient appears to be doing well currently in regard to her wound. She has been tolerating the dressing changes without complication. Fortunately there does not appear  to be any signs of active infection locally or systemically which is great news. No fevers, chills, nausea, vomiting, or diarrhea. READMISSION 09/24/2023 This is a 57 year old woman with type 2 diabetes. She spent a protracted time in this clinic from September 2023 through September 2024 with a substantial wound on the right plantar foot. There was underlying osteomyelitis. She was given antibiotics and I think had some treatments with hyperbaric oxygen. She Becvar, [name] ([id number]) [id number] Page 9 of 12 saw an MRI showed soft tissue inflammation extending to the plantar and dorsal surface of the calcaneus with probable early osteomyelitis in 07/01/2022. She was treated for a prolonged period of time with our usual dressings and a total contact cast and eventually healed out in September. She does not have a significant arterial issue on the right. She comes in today telling us that a few weeks ago she noticed some leakage coming out of her right heel. This seems to have callused over and she has not seen it recently. She has trouble looking at the bottom of her feet but she does live with her son and daughter. 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 She is using new balance shoes but without a specific diabetic shoe or insert Patient History Information obtained from Patient, Chart. Allergies No Known Allergies Family History Unknown History. Social History Never smoker, Marital Status - Married, Alcohol Use - Never, Drug  Use - No History, Caffeine Use - Rarely. Medical History Cardiovascular Patient has history of Hypertension Endocrine Patient has history of Type II Diabetes Musculoskeletal Patient has history of Osteomyelitis Patient is treated with Oral Agents. Blood sugar is tested. Hospitalization/Surgery History - right second toe amputation. - IandD right  foot. - cholecystectomy. - ORIF right ankle. Medical A Surgical History Notes nd Gastrointestinal CHRONIC GERD Psychiatric DEPRESSION AND ANXIETY Objective Constitutional Sitting or standing Blood Pressure is within target range for patient.. Pulse regular and within target range for patient.Marland Kitchen Respirations regular, non-labored and within target range.. Temperature is normal and within the target range for the patient.Marland Kitchen Appears in no distress. Vitals Time Taken: 8:40 AM, Temperature: 98.1 F, Pulse: 108 bpm, Respiratory Rate: 18 breaths/min, Blood Pressure: 123/86 mmHg. Cardiovascular Dorsalis pedis pulses palpable on the right. Her foot is warm.. Uncontrolled lymphedema in both legs. She is not using compression stockings that were suggested for her. General Notes: Wound exam; the patient had a literal lawn of callus over her plantar heel. I painstakingly removed this with a #5 curette and was able to identify a small area did seem to be open presumably the cause of the drainage she knows noticed. This is not probe deeply in fact is limited to skin level. There is no evidence of surrounding infection Integumentary (Hair, Skin) Wound #6 status is Open. Original cause of wound was Gradually Appeared. The date acquired was: 09/24/2023. The wound is located on the Right,Plantar Foot. The wound measures 1cm length x 1cm width x 0.1cm depth; 0.785cm^2 area and 0.079cm^3 volume. There is Fat Layer (Subcutaneous Tissue) exposed. There is no tunneling or undermining noted. There is a medium amount of serosanguineous drainage noted. There is large (67-100%) red granulation within the wound bed. There is a small (1-33%) amount of necrotic tissue within the wound bed including Adherent Slough. The periwound skin appearance exhibited: Callus. Assessment Active Problems ICD-10 Type 2 diabetes mellitus with foot ulcer Non-pressure chronic ulcer of other part of right foot limited to breakdown of  skin Lymphedema, not elsewhere classified [name], [name] ([id number]) [id number] Page 10 of 12 Procedures Wound #6 Pre-procedure diagnosis of Wound #6 is a Diabetic Wound/Ulcer of the Lower Extremity located on the Right,Plantar Foot .Severity of Tissue Pre Debridement is: Fat layer exposed. There was a Selective/Open Wound Skin/Epidermis Debridement with a total area of 0.78 sq cm performed by Maxwell Caul., MD. With the following instrument(s): Curette to remove Non-Viable tissue/material. Material removed includes Callus, Skin: Dermis, and Skin: Epidermis. No specimens were taken. A time out was conducted at 09:05, prior to the start of the procedure. A Minimum amount of bleeding was controlled with Pressure. The procedure was tolerated well. Post Debridement Measurements: 1cm length x 1cm width x 0.1cm depth; 0.079cm^3 volume. Character of Wound/Ulcer Post Debridement is improved. Severity of Tissue Post Debridement is: Fat layer exposed. Post procedure Diagnosis Wound #6: Same as Pre-Procedure Plan Follow-up Appointments: Return Appointment in 2 weeks. - Dr Leanord Hawking THursday 10/08/22 @ 0800 Rm 9 Bathing/ Shower/ Hygiene: May shower and wash wound with soap and water. Edema Control - Orders / Instructions: Moisturize legs daily. Off-Loading: Heel suspension boot to: - right foot WOUND #6: - Foot Wound Laterality: Plantar, Right Cleanser: Soap and Water 1 x Per Day/30 Days Discharge Instructions: May shower and wash wound with dial antibacterial soap and water prior to dressing change. Cleanser: Vashe 5.8 (oz) 1 x Per Day/30 Days Discharge Instructions: Cleanse the wound with Vashe prior to applying  a clean dressing using gauze sponges, not tissue or cotton balls. Peri-Wound Care: Sween Lotion (Moisturizing lotion) 1 x Per Day/30 Days Discharge Instructions: Apply moisturizing lotion as directed Prim Dressing: Hydrofera Blue Ready Transfer Foam, 2.5x2.5  (in/in) 1 x Per Day/30 Days ary Discharge Instructions: Apply directly to wound bed as directed Secondary Dressing: ALLEVYN Heel 4 1/2in x 5 1/2in / 10.5cm x 13.5cm 1 x Per Day/30 Days Discharge Instructions: Apply over primary dressing as directed. Secondary Dressing: ABD Pad, 5x9 1 x Per Day/30 Days Discharge Instructions: Apply over primary dressing as directed. Secured With: [employer], 4.5x3.1 (in/yd) 1 x Per Day/30 Days Discharge Instructions: Secure with Kerlix as directed. 1. We are going to use Hydrofera Blue Allevyn heel and a Kerlix wrap. Fortunately I do not think this is nearly as bad as last time 2. We gave her a heel offloading shoe which I am hopeful will allow this area to close without consideration of a total contact cast 3. I counseled her that she is going to have to start lubricating her skin not just of her feet especially her plantar feet, but also her lower legs. I have asked her to get her family who live with her to help her with this if she cannot do it herself. 4. She needs to wear compression stockings on the lymphedema or we will have another problem here. 5. The patient tried to get back in here and is been dealing with this for a month. It just took that long to get in our clinic. Electronic Signature(s) Signed: 09/25/2023 12:11:14 PM By: Baltazar Najjar MD Entered By: Baltazar Najjar on 09/24/2023 09:50:30 -------------------------------------------------------------------------------- HxROS Details Patient Name: Date of Service: [name], [name] 09/24/2023 8:00 A M Medical Record Number: [id number] Patient Account Number: [id number] Date of Birth/Sex: Treating RN: [birth year] (57 y.o. [name] Primary Care Provider: [name] Other Clinician: Referring Provider: Treating Provider/Extender: Delora Fuel in Treatment: 0 Label Progress Note Print Version as History and Physical for this encounter Information Obtained  From Patient Chart Cardiovascular Mcgillivray, Jackalyn ([id number]) [id number] Page 11 of 12 Medical History: Positive for: Hypertension Gastrointestinal Medical History: Past Medical History Notes: CHRONIC GERD Endocrine Medical History: Positive for: Type II Diabetes Treated with: Oral agents Blood sugar tested every day: Yes Tested : 1 Musculoskeletal Medical History: Positive for: Osteomyelitis Psychiatric Medical History: Past Medical History Notes: DEPRESSION AND ANXIETY Immunizations Pneumococcal Vaccine: Received Pneumococcal Vaccination: No Implantable Devices None Hospitalization / Surgery History Type of Hospitalization/Surgery right second toe amputation IandD right foot cholecystectomy ORIF right ankle Family and Social History Unknown History: Yes; Never smoker; Marital Status - Married; Alcohol Use: Never; Drug Use: No History; Caffeine Use: Rarely Social Determinants of Health (SDOH) 1. In the past 2 months, did you or others you live with eat smaller meals or skip meals because you didn't have money for foodo : No 2. Are you homeless or worried that you might be in the futureo : No 3. Do you have trouble paying for your utilities (gas, electricity, phone)o : No 4. Do you have trouble finding or paying for a rideo : No 5. Do you need daycare, or better daycare, for your kidso : No 6. Are you unemployed or without regular incomeo : No 7. Do you need help finding a better jobo : No 8. Do you need help getting more educationo : No 9. Are you concerned about someone in your home using drugs or alcoholo : No 10. Do  you feel unsafe in your daily lifeo : No 11. Is anyone in your home threatening or abusing youo : No 12. Do you lack quality relationships that make you feel valued and supportedo : No 13. Do you need help getting cultural information in a language you understando : No 14. Do you need help getting internet accesso :  No Advanced Directives and Instructions Spiritual or Cultural beliefs preclude asking about Advance Care Planning: No Advanced Directives: No Patient wants information on Advanced Directives: No Do not resuscitate: No Living Will: No Medical Power of Attorney: No Surrogate Decision Maker: No Electronic Signature(s) Signed: 09/24/2023 5:35:56 PM By: Shawn Stall RN, BSN Signed: 09/25/2023 12:11:14 PM By: Baltazar Najjar MD Entered By: Shawn Stall on 09/24/2023 08:42:17 Derringer, Beckett ([id number]) [id number] Page 12 of 12 -------------------------------------------------------------------------------- SuperBill Details Patient Name: Date of Service: AVONNA, MESERVEY 09/24/2023 Medical Record Number: [id number] Patient Account Number: [id number] Date of Birth/Sex: Treating RN: 08/18/1966 (57 y.o. F) Primary Care Provider: [name] Other Clinician: Referring Provider: Treating Provider/Extender: Delora Fuel in Treatment: 0 Diagnosis Coding ICD-10 Codes Code Description [phone] Type 2 diabetes mellitus with foot ulcer L97.511 Non-pressure chronic ulcer of other part of right foot limited to breakdown of skin I89.0 Lymphedema, not elsewhere classified Facility Procedures : CPT4 Code: 44034742 Description: 99213 - WOUND CARE VISIT-LEV 3 EST PT Modifier: Quantity: 1 : CPT4 Code: [id number] Description: 97597 - DEBRIDE WOUND 1ST 20 SQ CM OR < ICD-10 Diagnosis Description L97.511 Non-pressure chronic ulcer of other part of right foot limited to breakdown of s Modifier: kin Quantity: 1 Physician Procedures : CPT4 Code Description Modifier [id number] 99214 - WC PHYS LEVEL 4 - EST PT 25 ICD-10 Diagnosis Description E11.621 Type 2 diabetes mellitus with foot ulcer L97.511 Non-pressure chronic ulcer of other part of right foot limited to breakdown of skin I89.0  Lymphedema, not elsewhere classified Quantity: 1 : [id number] 97597 - WC PHYS  DEBR WO ANESTH 20 SQ CM ICD-10 Diagnosis Description L97.511 Non-pressure chronic ulcer of other part of right foot limited to breakdown of skin Quantity: 1 Electronic Signature(s) Signed: 09/24/2023 5:04:26 PM By: Redmond Pulling RN, BSN Signed: 09/25/2023 12:11:14 PM By: Baltazar Najjar MD Entered By: Redmond Pulling on 09/24/2023 10:11:27

## 2023-10-01 LAB — URINE CULTURE

## 2023-10-02 ENCOUNTER — Telehealth: Payer: Self-pay | Admitting: Urology

## 2023-10-02 NOTE — Telephone Encounter (Signed)
Patient called wanting the results to her urine culture, I explained to her our office is closed this week. She said she is in pain and someone needs to read it and get back with her asap.

## 2023-10-02 NOTE — Telephone Encounter (Signed)
Tried returning call to patient with no answer, left vm for return call to office.

## 2023-10-06 ENCOUNTER — Other Ambulatory Visit: Payer: Self-pay | Admitting: Urology

## 2023-10-06 ENCOUNTER — Telehealth: Payer: Self-pay

## 2023-10-06 DIAGNOSIS — N390 Urinary tract infection, site not specified: Secondary | ICD-10-CM

## 2023-10-06 MED ORDER — AMOXICILLIN-POT CLAVULANATE 875-125 MG PO TABS
1.0000 | ORAL_TABLET | Freq: Two times a day (BID) | ORAL | 0 refills | Status: AC
Start: 2023-10-06 — End: ?

## 2023-10-06 NOTE — Telephone Encounter (Signed)
-----   Message from [place]nita [place] sent at 10/04/2023  6:59 PM EST ----- Please ask pt if feeling better. She was Macrobid was prescribed for suspected UTI at LOV on 09/26/2023; urine culture came back positive for Klebsiella pneumoniae with only intermediate sensitivity to Macrobid. If she is still symptomatic then I will send in an alternative antibiotic.

## 2023-10-06 NOTE — Telephone Encounter (Signed)
Patient is made aware via voiced message "Please let pt know I have sent prescription for Augmentin today as alternative antibiotic, which should work well for her UTI based on the urine culture sensitivities. Thanks."

## 2023-10-06 NOTE — Telephone Encounter (Signed)
Patient was made aware. Patient states she is still symptomatic and would like an alternative antibiotic. Patient is aware a message will be sent to Sarah for alternative antibiotic. Patient voiced understanding.

## 2023-10-09 ENCOUNTER — Encounter (HOSPITAL_BASED_OUTPATIENT_CLINIC_OR_DEPARTMENT_OTHER): Payer: No Typology Code available for payment source | Admitting: Internal Medicine

## 2023-10-15 ENCOUNTER — Ambulatory Visit (HOSPITAL_BASED_OUTPATIENT_CLINIC_OR_DEPARTMENT_OTHER): Payer: No Typology Code available for payment source | Admitting: Internal Medicine

## 2023-10-20 ENCOUNTER — Telehealth: Payer: Self-pay | Admitting: Urology

## 2023-10-20 NOTE — Telephone Encounter (Signed)
Please advise next steps

## 2023-10-20 NOTE — Telephone Encounter (Signed)
 Called Pt to relay message from NP to stop taking Mycolog II and to follow up with PCP

## 2023-10-20 NOTE — Telephone Encounter (Signed)
 Called Pt to get clarification on what cream she believes isn't working/ making her itch worse Pt a states its the Mycolog II

## 2023-10-20 NOTE — Telephone Encounter (Signed)
 Says cream is not helping it only makes her itch more, she wants to see if you can call in something else please

## 2023-10-21 DIAGNOSIS — N952 Postmenopausal atrophic vaginitis: Secondary | ICD-10-CM | POA: Insufficient documentation

## 2023-10-21 NOTE — Progress Notes (Deleted)
Name: [name] DOB: [birth year] MRN: [id number]  History of Present Illness: [name] is a 58 y.o. female who presents today for follow up visit at [facility].  ***She is accompanied by ***.  Urine culture results in past 12 months: - 06/25/2023: Negative  - 09/26/2023: Positive for Klebsiella pneumoniae - ***No additional urine culture results in past 12 months found per chart review.  At initial visit on 09/26/2023: > LUTS: Dysuria, bladder pain, frequency, nocturia, urgency. - Discussed possible etiologies which include but are not limited to: recurrent UTI, non-infectious cystitis (such as interstitial cystitis), stone, mass, vaginal atrophy, and/or OAB.  - Her risk factors for UTIs include vaginal atrophy, uncontrolled diabetes, and glucosuria due to [name] use. - Abnormal catheterized UA. - Advised the following: 1. Urine culture.  2. Macrobid (Nitrofurantoin) 100 mg 2x/day x7 days. 3. Urogesic blue as alternative to OTC urinary analgesics (Pyridium / Uricalm). 4. Maintain adequate fluid intake daily to flush out the urinary tract. 5. Consider OTC supplements for UTI prevention. > Mixed urinary incontinence (urge predominant).  - Will address once UTI(s) well managed. > Vaginal atrophy.  - Topical vaginal estrogen cream as prescribed. > Candidal genitocrural rash.  - Mycolog (nystatin / triamcinolone) prescribed. Advised short term use only as needed due to risk for tissue thinning with prolonged steroid use.   Since last visit: > Urine culture from 09/26/2023 came back positive for Klebsiella pneumoniae with only intermediate sensitivity to Macrobid. Patient still symptomatic as of 10/06/2023 so Augmentin prescribed as alternative antibiotic.   > 10/20/2023: Patient reported that the Mycolog cream "isn't working/ making her itch worse". She was instructed to stop using that and to follow up with her PCP for the groin rash.  Today: She reports  ***  She {Actions; denies-reports:120008} increased urinary urgency, frequency, nocturia, dysuria, gross hematuria, hesitancy, straining to void, or sensations of incomplete emptying.   Fall Screening: Do you usually have a device to assist in your mobility? {yes/no:20286} ***cane / ***walker / ***wheelchair   Medications: Current Outpatient Medications  Medication Sig Dispense Refill   amoxicillin-clavulanate (AUGMENTIN) 875-125 MG tablet Take 1 tablet by mouth every 12 (twelve) hours. 14 tablet 0   ARIPiprazole (ABILIFY) 5 MG tablet Take 5 mg by mouth daily.     atorvastatin (LIPITOR) 20 MG tablet Take 20 mg by mouth daily.     cyclobenzaprine (FLEXERIL) 10 MG tablet Take 10 mg by mouth 2 (two) times daily.     escitalopram (LEXAPRO) 20 MG tablet Take 1 tablet by mouth daily.     estradiol (ESTRACE) 0.1 MG/GM vaginal cream Discard plastic applicator. Insert a blueberry size amount (approximately 1 gram) of cream on fingertip inside vagina at bedtime every night for 1 week then every other night. For long term use. 30 g 3   JARDIANCE 25 MG TABS tablet Take 25 mg by mouth daily.     ketoconazole (NIZORAL) 2 % shampoo Apply 1 application topically 2 (two) times a week.     lidocaine (LINDAMANTLE) 3 % CREA cream Apply to vulvovaginal area as needed; can be done every 8 hours. Use 15 minutes prior to applying topical vaginal estrogen cream if that stings when used. 28 g 1   lisinopril (ZESTRIL) 20 MG tablet Take 1 tablet by mouth daily.     Methen-Hyosc-Meth Blue-Na Phos (UROGESIC-BLUE) 81.6 MG TABS Take 1 tablet (81.6 mg total) by mouth every 6 (six) hours as needed. 120 tablet PRN   Multiple Vitamin (MULTI-VITAMIN)  tablet Take 1 tablet by mouth daily.     nystatin (MYCOSTATIN/NYSTOP) powder Apply topically 2 (two) times daily. to affected area(s)     nystatin-triamcinolone (MYCOLOG II) cream Apply sparingly to affected area(s) twice daily. Therapy should be discontinued when control is  achieved or if symptoms persist for >25 days of therapy. 30 g 1   Omega-3 1000 MG CAPS Take 2 capsules by mouth daily.     pantoprazole (PROTONIX) 40 MG tablet Take 1 tablet by mouth daily.     triamcinolone lotion (KENALOG) 0.1 % Apply 1 application topically daily.     No current facility-administered medications for this visit.    Allergies: No Known Allergies  Past Medical History:  Diagnosis Date   Back pain    Cellulitis    Chronic GERD    Diabetes mellitus without complication (HCC)    Hypertension    Past Surgical History:  Procedure Laterality Date   AMPUTATION Right 02/01/2021   Procedure: PARTIAL OR COMPLETE AMPUTATION RIGHT 2ND TOE;  Surgeon: Ferman Hamming, DPM;  Location: [facility];  Service: Podiatry;  Laterality: Right;   CHOLECYSTECTOMY     ENDOMETRIAL ABLATION     IRRIGATION AND DEBRIDEMENT FOOT Right 02/01/2021   Procedure: DEBRIDEMENT OF ULCERATION RIGHT HEEL;  Surgeon: Ferman Hamming, DPM;  Location: [facility];  Service: Podiatry;  Laterality: Right;   ORIF ANKLE FRACTURE Right    THYROID SURGERY Left    WOUND DEBRIDEMENT Bilateral 02/01/2021   Procedure: DEBRIDEMENT OF NAILS OF BOTH FEET AND DEBRIDEMENT OF A CALLUS OF THE LEFT FOOT;  Surgeon: Ferman Hamming, DPM;  Location: [facility];  Service: Podiatry;  Laterality: Bilateral;   No family history on file. Social History   Socioeconomic History   Marital status: Married    Spouse name: Not on file   Number of children: Not on file   Years of education: Not on file   Highest education level: Not on file  Occupational History   Not on file  Tobacco Use   Smoking status: Never   Smokeless tobacco: Never  Vaping Use   Vaping status: Never Used  Substance and Sexual Activity   Alcohol use: Never   Drug use: Never   Sexual activity: Not on file  Other Topics Concern   Not on file  Social History Narrative   ** Merged History Encounter **       Social Drivers of Health   Financial Resource  Strain: Not on file  Food Insecurity: No Food Insecurity (05/31/2022)   Hunger Vital Sign    Worried About Running Out of Food in the Last Year: Never true    Ran Out of Food in the Last Year: Never true  Transportation Needs: No Transportation Needs (05/31/2022)   PRAPARE - Administrator, Civil Service (Medical): No    Lack of Transportation (Non-Medical): No  Physical Activity: Inactive (03/31/2019)   Received from [facility], [facility]   Exercise Vital Sign    Days of Exercise per Week: 0 days    Minutes of Exercise per Session: 0 min  Stress: Not on file  Social Connections: Not on file  Intimate Partner Violence: Not At Risk (03/31/2019)   Received from [facility], [facility]   Humiliation, Afraid, Rape, and Kick questionnaire    Fear of Current or Ex-Partner: No    Emotionally Abused: No    Physically Abused: No    Sexually Abused: No  Review of Systems Constitutional: Patient denies any unintentional weight loss or change in strength lntegumentary: Patient denies any rashes or pruritus Cardiovascular: Patient denies chest pain or syncope Respiratory: Patient denies shortness of breath Gastrointestinal: Patient ***denies nausea, vomiting, constipation, or diarrhea Musculoskeletal: Patient denies muscle cramps or weakness Neurologic: Patient denies convulsions or seizures Allergic/Immunologic: Patient denies recent allergic reaction(s) Hematologic/Lymphatic: Patient denies bleeding tendencies Endocrine: Patient denies heat/cold intolerance  GU: As per HPI.  OBJECTIVE There were no vitals filed for this visit. There is no height or weight on file to calculate BMI.  Physical Examination Constitutional: No obvious distress; patient is non-toxic appearing  Cardiovascular: No visible lower extremity edema.  Respiratory: The patient does not have audible wheezing/stridor; respirations do not appear labored  Gastrointestinal: Abdomen  non-distended Musculoskeletal: Normal ROM of UEs  Skin: No obvious rashes/open sores  Neurologic: CN 2-12 grossly intact Psychiatric: Answered questions appropriately with normal affect  Hematologic/Lymphatic/Immunologic: No obvious bruises or sites of spontaneous bleeding  UA: ***negative / *** WBC/hpf, *** RBC/hpf, *** bacteria ***Urine microscopy: ***negative / *** WBC/hpf, *** RBC/hpf, *** bacteria ***with no evidence of UTI ***with no evidence of microscopic hematuria ***otherwise unremarkable  PVR: *** ml  ASSESSMENT No diagnosis found. ***  Will plan for follow up in *** months / ***1 year or sooner if needed. Pt verbalized understanding and agreement. All questions were answered.  PLAN Advised the following: 1. *** 2. ***No follow-ups on file.  No orders of the defined types were placed in this encounter.   It has been explained that the patient is to follow regularly with their PCP in addition to all other providers involved in their care and to follow instructions provided by these respective offices. Patient advised to contact urology clinic if any urologic-pertaining questions, concerns, new symptoms or problems arise in the interim period.  There are no Patient Instructions on file for this visit.  Electronically signed by:  Donnita Falls, FNP   10/21/23    11:00 AM

## 2023-10-27 ENCOUNTER — Ambulatory Visit: Payer: No Typology Code available for payment source | Admitting: Urology

## 2023-10-27 DIAGNOSIS — N952 Postmenopausal atrophic vaginitis: Secondary | ICD-10-CM

## 2023-10-27 DIAGNOSIS — Z87440 Personal history of urinary (tract) infections: Secondary | ICD-10-CM

## 2023-10-27 DIAGNOSIS — N3946 Mixed incontinence: Secondary | ICD-10-CM

## 2023-11-27 ENCOUNTER — Ambulatory Visit (HOSPITAL_BASED_OUTPATIENT_CLINIC_OR_DEPARTMENT_OTHER): Payer: No Typology Code available for payment source | Admitting: Internal Medicine

## 2023-12-01 ENCOUNTER — Encounter (HOSPITAL_BASED_OUTPATIENT_CLINIC_OR_DEPARTMENT_OTHER): Payer: No Typology Code available for payment source | Attending: Internal Medicine | Admitting: Internal Medicine

## 2023-12-01 DIAGNOSIS — L97511 Non-pressure chronic ulcer of other part of right foot limited to breakdown of skin: Secondary | ICD-10-CM | POA: Insufficient documentation

## 2023-12-01 DIAGNOSIS — I890 Lymphedema, not elsewhere classified: Secondary | ICD-10-CM | POA: Insufficient documentation

## 2023-12-01 DIAGNOSIS — E11621 Type 2 diabetes mellitus with foot ulcer: Secondary | ICD-10-CM | POA: Diagnosis not present

## 2023-12-09 ENCOUNTER — Ambulatory Visit (HOSPITAL_BASED_OUTPATIENT_CLINIC_OR_DEPARTMENT_OTHER): Payer: No Typology Code available for payment source | Admitting: Internal Medicine

## 2023-12-12 DIAGNOSIS — F419 Anxiety disorder, unspecified: Secondary | ICD-10-CM | POA: Insufficient documentation

## 2023-12-12 DIAGNOSIS — J101 Influenza due to other identified influenza virus with other respiratory manifestations: Secondary | ICD-10-CM | POA: Insufficient documentation

## 2023-12-12 DIAGNOSIS — E119 Type 2 diabetes mellitus without complications: Secondary | ICD-10-CM | POA: Insufficient documentation

## 2023-12-18 ENCOUNTER — Ambulatory Visit (HOSPITAL_BASED_OUTPATIENT_CLINIC_OR_DEPARTMENT_OTHER): Payer: No Typology Code available for payment source | Admitting: Internal Medicine

## 2023-12-23 ENCOUNTER — Ambulatory Visit (HOSPITAL_BASED_OUTPATIENT_CLINIC_OR_DEPARTMENT_OTHER): Payer: No Typology Code available for payment source | Admitting: Internal Medicine

## 2024-01-01 ENCOUNTER — Encounter (HOSPITAL_BASED_OUTPATIENT_CLINIC_OR_DEPARTMENT_OTHER): Attending: Internal Medicine | Admitting: Internal Medicine

## 2024-01-01 DIAGNOSIS — L97511 Non-pressure chronic ulcer of other part of right foot limited to breakdown of skin: Secondary | ICD-10-CM | POA: Insufficient documentation

## 2024-01-01 DIAGNOSIS — E11621 Type 2 diabetes mellitus with foot ulcer: Secondary | ICD-10-CM | POA: Diagnosis present

## 2024-01-01 DIAGNOSIS — I890 Lymphedema, not elsewhere classified: Secondary | ICD-10-CM | POA: Diagnosis not present

## 2024-02-26 IMAGING — DX DG FOOT COMPLETE 3+V*R*
3 series · 3 of 3 positions shown · non-contrast
Comparison: Foot radiograph 08/22/2021

CLINICAL DATA: Pain with ambulation. Wound to right heel.
Infection.

EXAM:
RIGHT FOOT COMPLETE - 3+ VIEW

[foot ap]
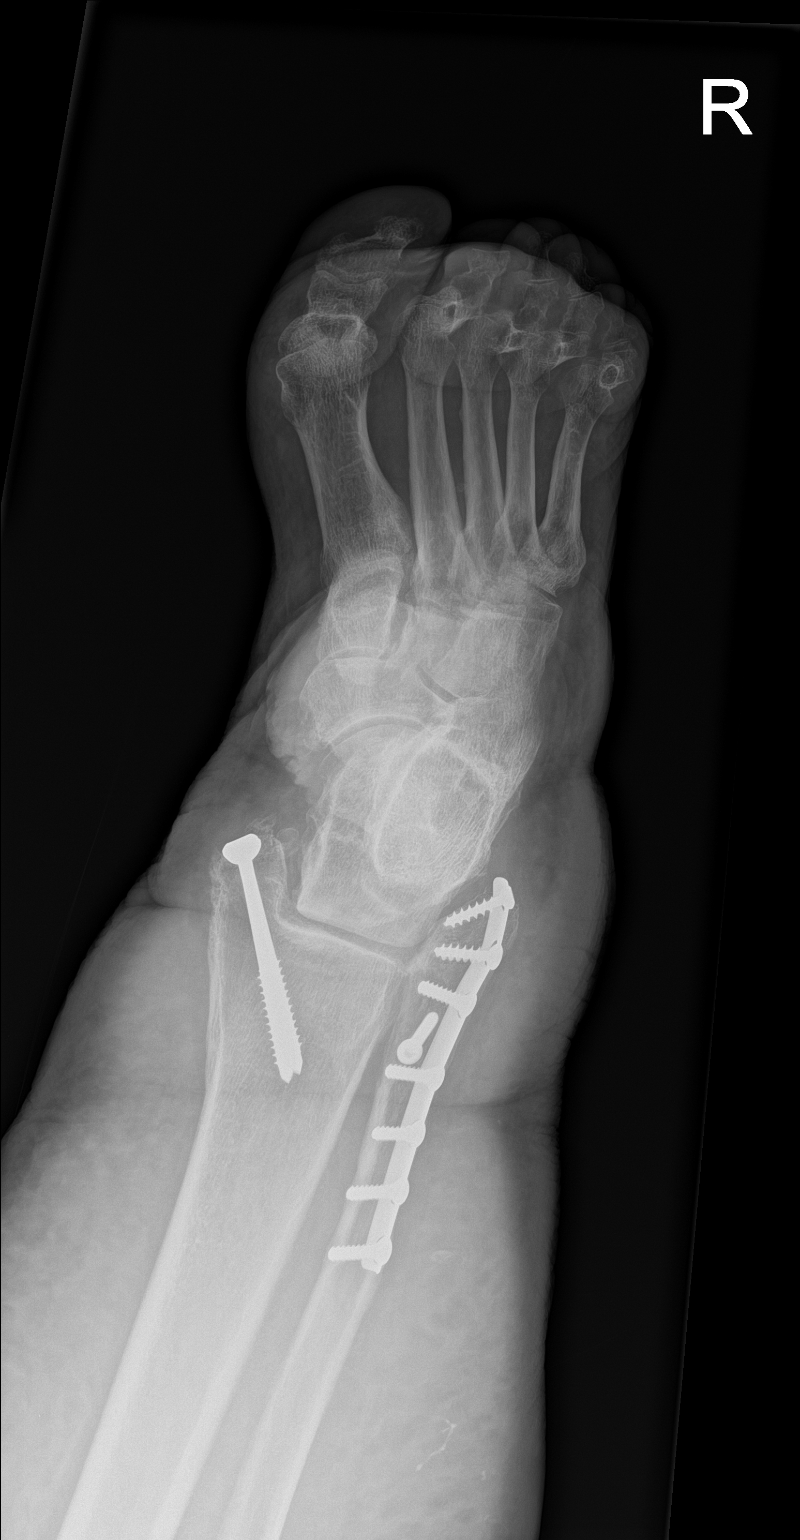

[foot lat]
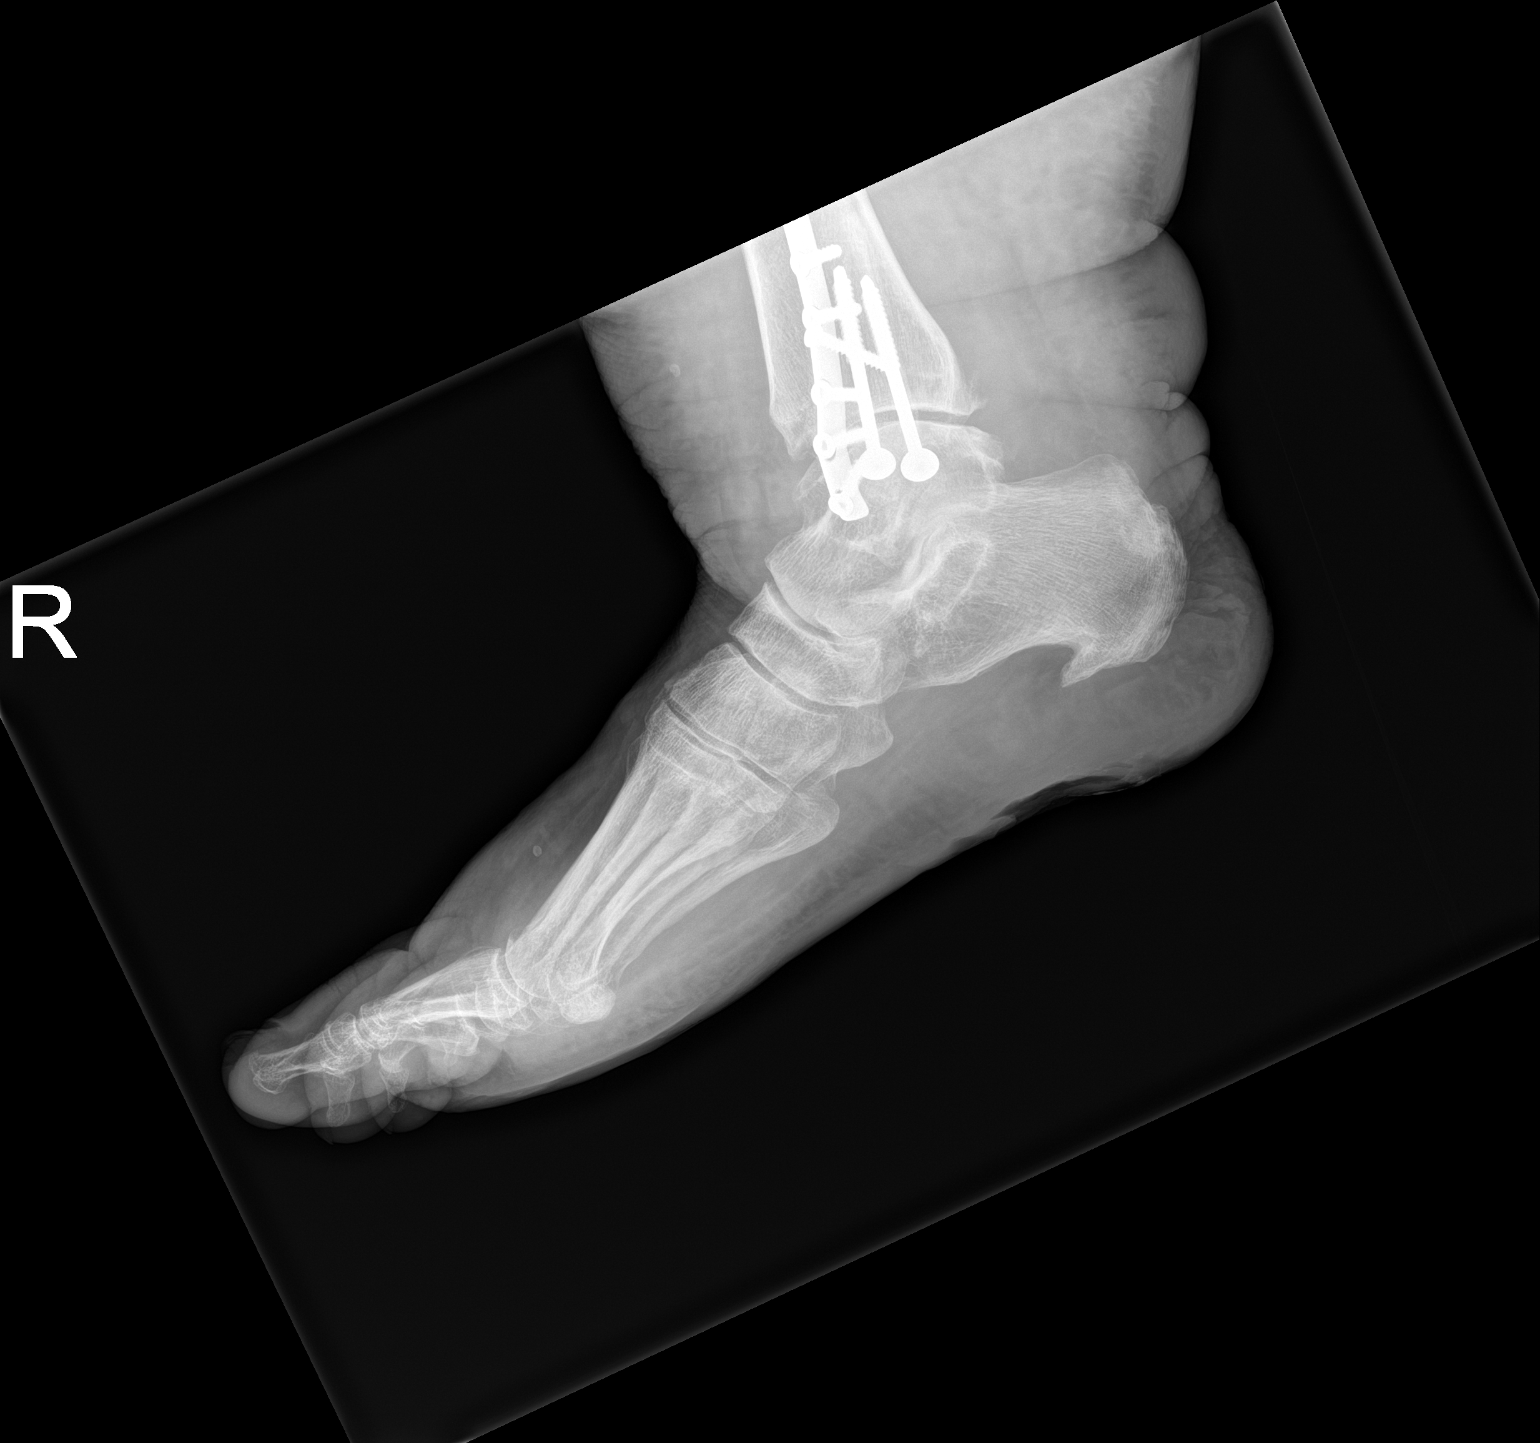

[foot obl]
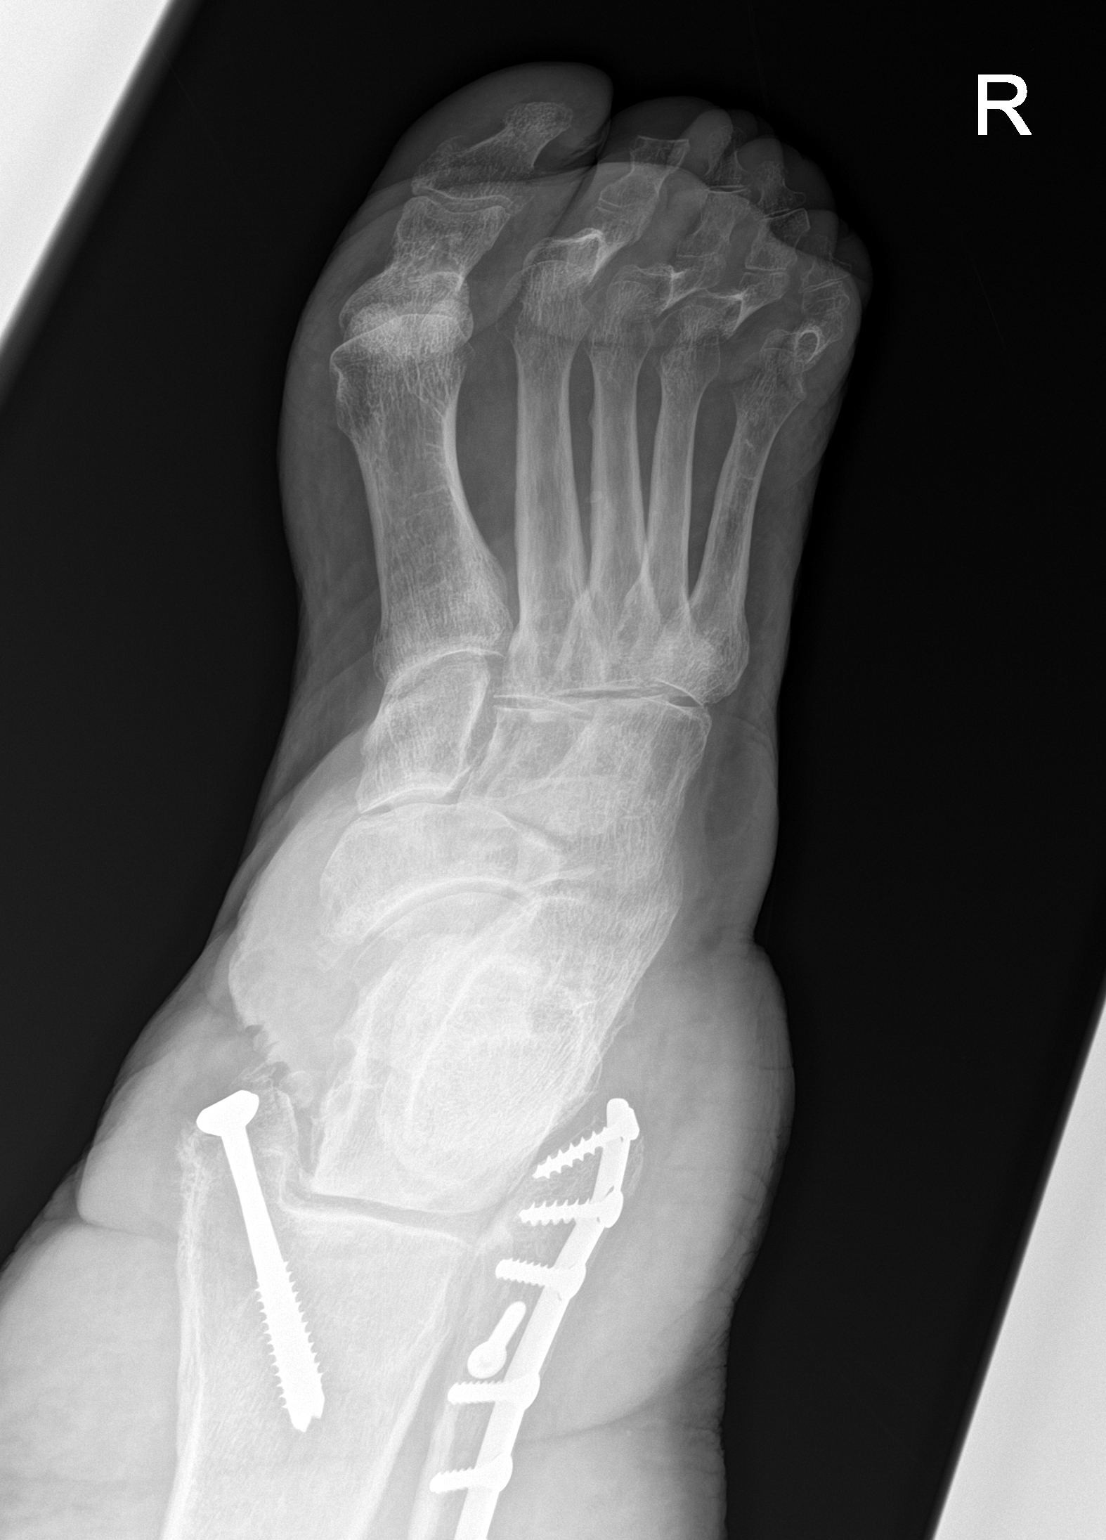

[3 of 3 positions shown; findings below may reference images not displayed]

FINDINGS: Soft tissue defect in the plantar aspect of the hindfoot, similar in
appearance to prior exam. Radiographically this is superficial in
location without deep extension. No tracking soft tissue gas or
radiopaque foreign body. No evidence of underlying osteomyelitis. No
bony destructive change, erosion, or periostitis. Large plantar
calcaneal spur. No acute fracture. Resection of the second toe
distal phalanx. Suboptimal assessment of the digits due to
positioning. Postsurgical change of the distal tibia and fibula is
partially included. There is generalized soft tissue edema versus
habitus.
IMPRESSION: Soft tissue defect in the plantar aspect of the hindfoot.
Radiographically this is superficial in location without deep
extension. No evidence of underlying osteomyelitis. No tracking soft
tissue gas or radiopaque foreign body.

## 2024-05-06 ENCOUNTER — Encounter (HOSPITAL_BASED_OUTPATIENT_CLINIC_OR_DEPARTMENT_OTHER): Attending: Internal Medicine | Admitting: Internal Medicine

## 2024-05-06 ENCOUNTER — Ambulatory Visit (HOSPITAL_COMMUNITY)
Admission: RE | Admit: 2024-05-06 | Discharge: 2024-05-06 | Disposition: A | Discharge status: Home / Self Care | Source: Ambulatory Visit | Attending: Internal Medicine | Admitting: Internal Medicine

## 2024-05-06 ENCOUNTER — Other Ambulatory Visit (HOSPITAL_COMMUNITY): Payer: Self-pay | Admitting: Internal Medicine

## 2024-05-06 DIAGNOSIS — L97418 Non-pressure chronic ulcer of right heel and midfoot with other specified severity: Secondary | ICD-10-CM | POA: Diagnosis not present

## 2024-05-06 DIAGNOSIS — E1169 Type 2 diabetes mellitus with other specified complication: Secondary | ICD-10-CM | POA: Insufficient documentation

## 2024-05-06 DIAGNOSIS — I890 Lymphedema, not elsewhere classified: Secondary | ICD-10-CM | POA: Insufficient documentation

## 2024-05-06 DIAGNOSIS — M869 Osteomyelitis, unspecified: Secondary | ICD-10-CM

## 2024-05-06 DIAGNOSIS — E11621 Type 2 diabetes mellitus with foot ulcer: Secondary | ICD-10-CM | POA: Insufficient documentation

## 2024-05-06 DIAGNOSIS — M86171 Other acute osteomyelitis, right ankle and foot: Secondary | ICD-10-CM | POA: Insufficient documentation

## 2024-05-07 ENCOUNTER — Telehealth (HOSPITAL_COMMUNITY): Payer: Self-pay

## 2024-05-07 NOTE — Telephone Encounter (Signed)
 Attempted to contact the patient to schedule VAS [place]. No answer. Left message. First Attempt Provided direct contact number for scheduling: [phone].

## 2024-05-11 ENCOUNTER — Encounter (HOSPITAL_BASED_OUTPATIENT_CLINIC_OR_DEPARTMENT_OTHER): Attending: Internal Medicine | Admitting: Internal Medicine

## 2024-05-11 DIAGNOSIS — I890 Lymphedema, not elsewhere classified: Secondary | ICD-10-CM | POA: Diagnosis not present

## 2024-05-11 DIAGNOSIS — L97418 Non-pressure chronic ulcer of right heel and midfoot with other specified severity: Secondary | ICD-10-CM | POA: Diagnosis not present

## 2024-05-11 DIAGNOSIS — E11621 Type 2 diabetes mellitus with foot ulcer: Secondary | ICD-10-CM | POA: Insufficient documentation

## 2024-05-14 ENCOUNTER — Other Ambulatory Visit (HOSPITAL_COMMUNITY): Payer: Self-pay | Admitting: Internal Medicine

## 2024-05-14 DIAGNOSIS — E11621 Type 2 diabetes mellitus with foot ulcer: Secondary | ICD-10-CM

## 2024-05-17 ENCOUNTER — Ambulatory Visit (HOSPITAL_COMMUNITY)
Admission: RE | Admit: 2024-05-17 | Discharge: 2024-05-17 | Disposition: A | Discharge status: Home / Self Care | Source: Ambulatory Visit | Attending: Surgery | Admitting: Surgery

## 2024-05-17 DIAGNOSIS — L97509 Non-pressure chronic ulcer of other part of unspecified foot with unspecified severity: Secondary | ICD-10-CM | POA: Diagnosis not present

## 2024-05-17 DIAGNOSIS — E11621 Type 2 diabetes mellitus with foot ulcer: Secondary | ICD-10-CM | POA: Diagnosis present

## 2024-05-17 LAB — VAS US ABI WITH/WO TBI
Left ABI: 1.04
Right ABI: 1.24

## 2024-05-18 ENCOUNTER — Encounter (HOSPITAL_BASED_OUTPATIENT_CLINIC_OR_DEPARTMENT_OTHER): Admitting: Internal Medicine

## 2024-05-18 DIAGNOSIS — L97418 Non-pressure chronic ulcer of right heel and midfoot with other specified severity: Secondary | ICD-10-CM | POA: Diagnosis not present

## 2024-05-18 DIAGNOSIS — E11621 Type 2 diabetes mellitus with foot ulcer: Secondary | ICD-10-CM | POA: Diagnosis present

## 2024-05-18 DIAGNOSIS — I890 Lymphedema, not elsewhere classified: Secondary | ICD-10-CM | POA: Diagnosis not present

## 2024-05-25 ENCOUNTER — Encounter (HOSPITAL_BASED_OUTPATIENT_CLINIC_OR_DEPARTMENT_OTHER): Admitting: Internal Medicine

## 2024-05-25 DIAGNOSIS — I890 Lymphedema, not elsewhere classified: Secondary | ICD-10-CM

## 2024-05-25 DIAGNOSIS — E11621 Type 2 diabetes mellitus with foot ulcer: Secondary | ICD-10-CM

## 2024-05-25 DIAGNOSIS — L97418 Non-pressure chronic ulcer of right heel and midfoot with other specified severity: Secondary | ICD-10-CM

## 2024-06-01 ENCOUNTER — Encounter (HOSPITAL_BASED_OUTPATIENT_CLINIC_OR_DEPARTMENT_OTHER): Admitting: Internal Medicine

## 2024-06-01 DIAGNOSIS — E11621 Type 2 diabetes mellitus with foot ulcer: Secondary | ICD-10-CM | POA: Diagnosis not present

## 2024-06-01 DIAGNOSIS — I890 Lymphedema, not elsewhere classified: Secondary | ICD-10-CM | POA: Diagnosis not present

## 2024-06-01 DIAGNOSIS — L97418 Non-pressure chronic ulcer of right heel and midfoot with other specified severity: Secondary | ICD-10-CM

## 2024-06-03 ENCOUNTER — Other Ambulatory Visit (HOSPITAL_COMMUNITY): Payer: Self-pay | Admitting: Internal Medicine

## 2024-06-03 DIAGNOSIS — E11621 Type 2 diabetes mellitus with foot ulcer: Secondary | ICD-10-CM

## 2024-06-08 ENCOUNTER — Ambulatory Visit (HOSPITAL_BASED_OUTPATIENT_CLINIC_OR_DEPARTMENT_OTHER): Admitting: Internal Medicine

## 2024-06-12 ENCOUNTER — Ambulatory Visit (HOSPITAL_COMMUNITY)
Admission: RE | Admit: 2024-06-12 | Discharge: 2024-06-12 | Disposition: A | Discharge status: Home / Self Care | Source: Ambulatory Visit | Attending: Internal Medicine

## 2024-06-12 DIAGNOSIS — L97509 Non-pressure chronic ulcer of other part of unspecified foot with unspecified severity: Secondary | ICD-10-CM | POA: Insufficient documentation

## 2024-06-12 DIAGNOSIS — E1169 Type 2 diabetes mellitus with other specified complication: Secondary | ICD-10-CM | POA: Insufficient documentation

## 2024-06-12 DIAGNOSIS — M869 Osteomyelitis, unspecified: Secondary | ICD-10-CM | POA: Insufficient documentation

## 2024-06-12 DIAGNOSIS — E11621 Type 2 diabetes mellitus with foot ulcer: Secondary | ICD-10-CM | POA: Insufficient documentation

## 2024-06-12 MED ORDER — GADOBUTROL 1 MMOL/ML IV SOLN
10.0000 mL | Freq: Once | INTRAVENOUS | Status: AC | PRN
Start: 2024-06-12 — End: 2024-06-12
  Administered 2024-06-12: 10 mL via INTRAVENOUS

## 2024-06-15 ENCOUNTER — Encounter (HOSPITAL_BASED_OUTPATIENT_CLINIC_OR_DEPARTMENT_OTHER): Attending: Internal Medicine | Admitting: Internal Medicine

## 2024-06-15 DIAGNOSIS — E11621 Type 2 diabetes mellitus with foot ulcer: Secondary | ICD-10-CM | POA: Diagnosis not present

## 2024-06-15 DIAGNOSIS — I890 Lymphedema, not elsewhere classified: Secondary | ICD-10-CM | POA: Diagnosis not present

## 2024-06-15 DIAGNOSIS — L97418 Non-pressure chronic ulcer of right heel and midfoot with other specified severity: Secondary | ICD-10-CM | POA: Diagnosis not present

## 2024-06-15 DIAGNOSIS — Z7984 Long term (current) use of oral hypoglycemic drugs: Secondary | ICD-10-CM | POA: Diagnosis not present

## 2024-06-22 ENCOUNTER — Encounter (HOSPITAL_BASED_OUTPATIENT_CLINIC_OR_DEPARTMENT_OTHER): Admitting: Internal Medicine

## 2024-06-22 DIAGNOSIS — E11621 Type 2 diabetes mellitus with foot ulcer: Secondary | ICD-10-CM

## 2024-06-22 DIAGNOSIS — L97418 Non-pressure chronic ulcer of right heel and midfoot with other specified severity: Secondary | ICD-10-CM

## 2024-06-22 DIAGNOSIS — I890 Lymphedema, not elsewhere classified: Secondary | ICD-10-CM

## 2024-06-26 LAB — AEROBIC CULTURE W GRAM STAIN (SUPERFICIAL SPECIMEN): Gram Stain: NONE SEEN

## 2024-06-27 LAB — ANAEROBIC CULTURE W GRAM STAIN: Gram Stain: NONE SEEN

## 2024-06-29 ENCOUNTER — Encounter (HOSPITAL_BASED_OUTPATIENT_CLINIC_OR_DEPARTMENT_OTHER): Admitting: Internal Medicine

## 2024-07-06 ENCOUNTER — Encounter: Payer: Self-pay | Admitting: Infectious Disease

## 2024-07-06 ENCOUNTER — Encounter (HOSPITAL_BASED_OUTPATIENT_CLINIC_OR_DEPARTMENT_OTHER): Admitting: Internal Medicine

## 2024-07-06 DIAGNOSIS — L97418 Non-pressure chronic ulcer of right heel and midfoot with other specified severity: Secondary | ICD-10-CM

## 2024-07-06 DIAGNOSIS — E11621 Type 2 diabetes mellitus with foot ulcer: Secondary | ICD-10-CM | POA: Diagnosis not present

## 2024-07-06 DIAGNOSIS — I890 Lymphedema, not elsewhere classified: Secondary | ICD-10-CM | POA: Diagnosis not present

## 2024-07-06 DIAGNOSIS — M86371 Chronic multifocal osteomyelitis, right ankle and foot: Secondary | ICD-10-CM | POA: Insufficient documentation

## 2024-07-06 DIAGNOSIS — M86171 Other acute osteomyelitis, right ankle and foot: Secondary | ICD-10-CM | POA: Insufficient documentation

## 2024-07-06 NOTE — Progress Notes (Unsigned)
 Reason for Infectious Disease Consult: osteomyelitis involving heel  Subjective:    Patient ID: [name], female    DOB: [birth year], 58 y.o.   MRN: [id number]  HPI  Past Medical History:  Diagnosis Date   Back pain    Cellulitis    Chronic GERD    Diabetes mellitus without complication (HCC)    Hypertension     Past Surgical History:  Procedure Laterality Date   AMPUTATION Right 02/01/2021   Procedure: PARTIAL OR COMPLETE AMPUTATION RIGHT 2ND TOE;  Surgeon: Blinda Katz, DPM;  Location: [facility];  Service: Podiatry;  Laterality: Right;   CHOLECYSTECTOMY     ENDOMETRIAL ABLATION     IRRIGATION AND DEBRIDEMENT FOOT Right 02/01/2021   Procedure: DEBRIDEMENT OF ULCERATION RIGHT HEEL;  Surgeon: Blinda Katz, DPM;  Location: [facility];  Service: Podiatry;  Laterality: Right;   ORIF ANKLE FRACTURE Right    THYROID  SURGERY Left    WOUND DEBRIDEMENT Bilateral 02/01/2021   Procedure: DEBRIDEMENT OF NAILS OF BOTH FEET AND DEBRIDEMENT OF A CALLUS OF THE LEFT FOOT;  Surgeon: Blinda Katz, DPM;  Location: [facility];  Service: Podiatry;  Laterality: Bilateral;    No family history on file.    Social History   Socioeconomic History   Marital status: Married    Spouse name: Not on file   Number of children: Not on file   Years of education: Not on file   Highest education level: Not on file  Occupational History   Not on file  Tobacco Use   Smoking status: Never   Smokeless tobacco: Never  Vaping Use   Vaping status: Never Used  Substance and Sexual Activity   Alcohol use: Never   Drug use: Never   Sexual activity: Not on file  Other Topics Concern   Not on file  Social History Narrative   ** Merged History Encounter **       Social Drivers of Health   Financial Resource Strain: Low Risk  (12/13/2023)   Received from [facility]   Overall Financial Resource Strain (CARDIA)    Difficulty of Paying Living Expenses: Not hard at all  Food Insecurity: No Food  Insecurity (12/13/2023)   Received from [facility]   Hunger Vital Sign    Within the past 12 months, you worried that your food would run out before you got the money to buy more.: Never true    Within the past 12 months, the food you bought just didn't last and you didn't have money to get more.: Never true  Transportation Needs: No Transportation Needs (12/13/2023)   Received from [facility]   PRAPARE - Transportation    Lack of Transportation (Medical): No    Lack of Transportation (Non-Medical): No  Physical Activity: Inactive (03/31/2019)   Received from [facility]   Exercise Vital Sign    On average, how many days per week do you engage in moderate to strenuous exercise (like a brisk walk)?: 0 days    On average, how many minutes do you engage in exercise at this level?: 0 min  Stress: Not on file  Social Connections: Not on file    No Known Allergies   Current Outpatient Medications:    amoxicillin -clavulanate (AUGMENTIN ) 875-125 MG tablet, Take 1 tablet by mouth every 12 (twelve) hours., Disp: 14 tablet, Rfl: 0   ARIPiprazole  (ABILIFY ) 5 MG tablet, Take 5 mg by mouth daily., Disp: , Rfl:    atorvastatin (LIPITOR)  20 MG tablet, Take 20 mg by mouth daily., Disp: , Rfl:    cyclobenzaprine (FLEXERIL) 10 MG tablet, Take 10 mg by mouth 2 (two) times daily., Disp: , Rfl:    escitalopram  (LEXAPRO ) 20 MG tablet, Take 1 tablet by mouth daily., Disp: , Rfl:    estradiol  (ESTRACE ) 0.1 MG/GM vaginal cream, Discard plastic applicator. Insert a blueberry size amount (approximately 1 gram) of cream on fingertip inside vagina at bedtime every night for 1 week then every other night. For long term use., Disp: 30 g, Rfl: 3   JARDIANCE 25 MG TABS tablet, Take 25 mg by mouth daily., Disp: , Rfl:    ketoconazole (NIZORAL) 2 % shampoo, Apply 1 application topically 2 (two) times a week., Disp: , Rfl:    lidocaine  (LINDAMANTLE) 3 % CREA cream, Apply to vulvovaginal area as needed; can be  done every 8 hours. Use 15 minutes prior to applying topical vaginal estrogen cream if that stings when used., Disp: 28 g, Rfl: 1   lisinopril (ZESTRIL) 20 MG tablet, Take 1 tablet by mouth daily., Disp: , Rfl:    Methen-Hyosc-Meth Blue-Na Phos (UROGESIC-BLUE) 81.6 MG TABS, Take 1 tablet (81.6 mg total) by mouth every 6 (six) hours as needed., Disp: 120 tablet, Rfl: PRN   Multiple Vitamin (MULTI-VITAMIN) tablet, Take 1 tablet by mouth daily., Disp: , Rfl:    nystatin  (MYCOSTATIN /NYSTOP ) powder, Apply topically 2 (two) times daily. to affected area(s), Disp: , Rfl:    nystatin -triamcinolone  (MYCOLOG II) cream, Apply sparingly to affected area(s) twice daily. Therapy should be discontinued when control is achieved or if symptoms persist for >25 days of therapy., Disp: 30 g, Rfl: 1   Omega-3 1000 MG CAPS, Take 2 capsules by mouth daily., Disp: , Rfl:    pantoprazole  (PROTONIX ) 40 MG tablet, Take 1 tablet by mouth daily., Disp: , Rfl:    triamcinolone  lotion (KENALOG) 0.1 %, Apply 1 application topically daily., Disp: , Rfl:    Review of Systems     Objective:   Physical Exam        Assessment & Plan:

## 2024-07-07 ENCOUNTER — Telehealth: Payer: Self-pay

## 2024-07-07 ENCOUNTER — Encounter: Payer: Self-pay | Admitting: Infectious Disease

## 2024-07-07 ENCOUNTER — Ambulatory Visit (INDEPENDENT_AMBULATORY_CARE_PROVIDER_SITE_OTHER): Admitting: Infectious Disease

## 2024-07-07 ENCOUNTER — Other Ambulatory Visit: Payer: Self-pay

## 2024-07-07 VITALS — BP 121/76 | HR 92 | Temp 99.1°F | Wt 339.0 lb

## 2024-07-07 DIAGNOSIS — B953 Streptococcus pneumoniae as the cause of diseases classified elsewhere: Secondary | ICD-10-CM

## 2024-07-07 DIAGNOSIS — B9562 Methicillin resistant Staphylococcus aureus infection as the cause of diseases classified elsewhere: Secondary | ICD-10-CM

## 2024-07-07 DIAGNOSIS — M86371 Chronic multifocal osteomyelitis, right ankle and foot: Secondary | ICD-10-CM | POA: Diagnosis not present

## 2024-07-07 DIAGNOSIS — E11628 Type 2 diabetes mellitus with other skin complications: Secondary | ICD-10-CM

## 2024-07-07 DIAGNOSIS — M86671 Other chronic osteomyelitis, right ankle and foot: Secondary | ICD-10-CM

## 2024-07-07 DIAGNOSIS — B965 Pseudomonas (aeruginosa) (mallei) (pseudomallei) as the cause of diseases classified elsewhere: Secondary | ICD-10-CM

## 2024-07-07 DIAGNOSIS — E11621 Type 2 diabetes mellitus with foot ulcer: Secondary | ICD-10-CM | POA: Diagnosis not present

## 2024-07-07 DIAGNOSIS — M86171 Other acute osteomyelitis, right ankle and foot: Secondary | ICD-10-CM

## 2024-07-07 DIAGNOSIS — L97419 Non-pressure chronic ulcer of right heel and midfoot with unspecified severity: Secondary | ICD-10-CM | POA: Diagnosis not present

## 2024-07-07 DIAGNOSIS — M869 Osteomyelitis, unspecified: Secondary | ICD-10-CM

## 2024-07-07 NOTE — Telephone Encounter (Signed)
 Per Dr. Fleeta Rothman reached out to [facility] to schedule appt for picc line. Scheduled for 10/8 at 11 with [place] [facility]. Community message sent to [employer] team with opat.  Will need to confirm if first dose can be done at home of if she will need to go to short stay.  Waiting on [employer].  Lorenda CHRISTELLA Code, RMA

## 2024-07-08 LAB — CBC WITH DIFFERENTIAL/PLATELET
Absolute Lymphocytes: 1221 {cells}/uL (ref 850–3900)
Absolute Monocytes: 739 {cells}/uL (ref 200–950)
Basophils Absolute: 33 {cells}/uL (ref 0–200)
Basophils Relative: 0.5 %
Eosinophils Absolute: 152 {cells}/uL (ref 15–500)
Eosinophils Relative: 2.3 %
HCT: 44.6 % (ref 35.0–45.0)
Hemoglobin: 14.6 g/dL (ref 11.7–15.5)
MCH: 28.2 pg (ref 27.0–33.0)
MCHC: 32.7 g/dL (ref 32.0–36.0)
MCV: 86.3 fL (ref 80.0–100.0)
MPV: 10.2 fL (ref 7.5–12.5)
Monocytes Relative: 11.2 %
Neutro Abs: 4455 {cells}/uL (ref 1500–7800)
Neutrophils Relative %: 67.5 %
Platelets: 388 Thousand/uL (ref 140–400)
RBC: 5.17 Million/uL — ABNORMAL HIGH (ref 3.80–5.10)
RDW: 12.3 % (ref 11.0–15.0)
Total Lymphocyte: 18.5 %
WBC: 6.6 Thousand/uL (ref 3.8–10.8)

## 2024-07-08 LAB — COMPLETE METABOLIC PANEL WITHOUT GFR
AG Ratio: 1.2 (calc) (ref 1.0–2.5)
ALT: 14 U/L (ref 6–29)
AST: 14 U/L (ref 10–35)
Albumin: 3.7 g/dL (ref 3.6–5.1)
Alkaline phosphatase (APISO): 109 U/L (ref 37–153)
BUN/Creatinine Ratio: 26 (calc) — ABNORMAL HIGH (ref 6–22)
BUN: 11 mg/dL (ref 7–25)
CO2: 28 mmol/L (ref 20–32)
Calcium: 9.3 mg/dL (ref 8.6–10.4)
Chloride: 104 mmol/L (ref 98–110)
Creat: 0.43 mg/dL — ABNORMAL LOW (ref 0.50–1.03)
Globulin: 3 g/dL (ref 1.9–3.7)
Glucose, Bld: 143 mg/dL — ABNORMAL HIGH (ref 65–99)
Potassium: 4 mmol/L (ref 3.5–5.3)
Sodium: 139 mmol/L (ref 135–146)
Total Bilirubin: 0.7 mg/dL (ref 0.2–1.2)
Total Protein: 6.7 g/dL (ref 6.1–8.1)

## 2024-07-08 LAB — HEMOGLOBIN A1C
Hgb A1c MFr Bld: 8.1 % — ABNORMAL HIGH (ref <5.7)
Mean Plasma Glucose: 186 mg/dL
eAG (mmol/L): 10.3 mmol/L

## 2024-07-08 LAB — SEDIMENTATION RATE: Sed Rate: 38 mm/h — ABNORMAL HIGH (ref 0–30)

## 2024-07-08 LAB — C-REACTIVE PROTEIN: CRP: 47.8 mg/L — ABNORMAL HIGH (ref <8.0)

## 2024-07-13 NOTE — Telephone Encounter (Signed)
 HH able to do first dose at home.  Lorenda CHRISTELLA Code, RMA

## 2024-07-14 ENCOUNTER — Ambulatory Visit (HOSPITAL_COMMUNITY)
Admission: RE | Admit: 2024-07-14 | Discharge: 2024-07-14 | Disposition: A | Discharge status: Home / Self Care | Source: Ambulatory Visit | Attending: Infectious Disease | Admitting: Infectious Disease

## 2024-07-14 DIAGNOSIS — M869 Osteomyelitis, unspecified: Secondary | ICD-10-CM | POA: Insufficient documentation

## 2024-07-14 MED ORDER — HEPARIN SOD (PORK) LOCK FLUSH 100 UNIT/ML IV SOLN
INTRAVENOUS | Status: AC
  Filled 2024-07-14: qty 5

## 2024-07-14 MED ORDER — LIDOCAINE-EPINEPHRINE 1 %-1:100000 IJ SOLN
INTRAMUSCULAR | Status: AC
  Filled 2024-07-14: qty 1

## 2024-07-14 MED ORDER — LIDOCAINE-EPINEPHRINE 1 %-1:100000 IJ SOLN
20.0000 mL | Freq: Once | INTRAMUSCULAR | Status: AC
  Administered 2024-07-14: 10 mL via INTRADERMAL

## 2024-07-14 NOTE — Procedures (Signed)
 Procedure summary:  Right basilic vein single lumen PICC placed without immediate complications. Length 40 cm tip SVC / right atrial junction. Okay to use. Medication used - 1% lidocaine . EBL < 2 ml.   Please see imaging section of Epic for full dictation.  Kimble DEL Ricard Faulkner PA-C 07/14/2024 11:42 AM

## 2024-07-15 ENCOUNTER — Encounter (HOSPITAL_BASED_OUTPATIENT_CLINIC_OR_DEPARTMENT_OTHER): Admitting: Internal Medicine

## 2024-07-19 ENCOUNTER — Encounter (HOSPITAL_BASED_OUTPATIENT_CLINIC_OR_DEPARTMENT_OTHER): Attending: Internal Medicine | Admitting: Internal Medicine

## 2024-07-19 DIAGNOSIS — L97412 Non-pressure chronic ulcer of right heel and midfoot with fat layer exposed: Secondary | ICD-10-CM | POA: Insufficient documentation

## 2024-07-19 DIAGNOSIS — E11621 Type 2 diabetes mellitus with foot ulcer: Secondary | ICD-10-CM | POA: Diagnosis not present

## 2024-07-19 DIAGNOSIS — L97418 Non-pressure chronic ulcer of right heel and midfoot with other specified severity: Secondary | ICD-10-CM

## 2024-07-19 DIAGNOSIS — I890 Lymphedema, not elsewhere classified: Secondary | ICD-10-CM | POA: Insufficient documentation

## 2024-07-20 LAB — LAB REPORT - SCANNED: EGFR: 111

## 2024-07-24 LAB — LAB REPORT - SCANNED: EGFR: 115

## 2024-08-02 ENCOUNTER — Encounter (HOSPITAL_BASED_OUTPATIENT_CLINIC_OR_DEPARTMENT_OTHER): Admitting: Internal Medicine

## 2024-08-02 DIAGNOSIS — L97418 Non-pressure chronic ulcer of right heel and midfoot with other specified severity: Secondary | ICD-10-CM

## 2024-08-02 DIAGNOSIS — E11621 Type 2 diabetes mellitus with foot ulcer: Secondary | ICD-10-CM | POA: Diagnosis not present

## 2024-08-02 DIAGNOSIS — I890 Lymphedema, not elsewhere classified: Secondary | ICD-10-CM | POA: Diagnosis not present

## 2024-08-06 NOTE — Telephone Encounter (Signed)
 Referral received for Referral received for [facility]  ,   Patient / surrogate contacted and two patient identifiers verified.  Referring provider Yancey Elden Reno, MD  Date referral placed 08/02/2024  Diagnosis of referral Pressure ulcer of other site, unspecified stage [L89.899]  Any Imaging Associated w/ diagnosis of referral received No per patient  Spoke with Dorthea Darter Uy  in regards to scheduling consult appointment for referral received. Patient accepted date / time of appointment noted below.   Patient / surrogate encouraged to arrive 15 minutes prior to scheduled appointment time.  Total time spent with member: N/A minute(s)   Scheduled Future Appointments       Provider Department Dept Phone Center   09/09/2024 1:30 PM Garnette Savant [facility] [place] [facility] [place] [address] GENERAL SURGERY [phone] [facility] [place]        Glennis JAYSON Neve 4:15 PM 08/06/2024   ,   Patient / surrogate contacted and two patient identifiers verified.  Referring provider Yancey Elden Reno, MD  Date referral placed 08/02/2024  Diagnosis of referral Pressure ulcer of other site, unspecified stage [L89.899]   Any Imaging Associated w/ diagnosis of referral received No  Spoke with Dorthea Darter Alsteen  in regards to scheduling consult appointment for referral received. Patient accepted date / time of appointment noted below.   Patient / surrogate encouraged to arrive 15 minutes prior to scheduled appointment time.  Total time spent with member: N/A minute(s)   Scheduled Future Appointments       Provider Department Dept Phone Center   09/09/2024 1:30 PM Garnette Savant [facility] [place] [facility] [place] [address] GENERAL SURGERY [phone] [facility] [place]        Glennis JAYSON Neve 4:13 PM 08/06/2024

## 2024-08-10 ENCOUNTER — Ambulatory Visit: Payer: Self-pay | Admitting: Infectious Disease

## 2024-08-10 ENCOUNTER — Other Ambulatory Visit: Payer: Self-pay

## 2024-08-10 ENCOUNTER — Telehealth: Payer: Self-pay

## 2024-08-10 VITALS — BP 128/87 | HR 105 | Temp 98.0°F | Wt 338.0 lb

## 2024-08-10 DIAGNOSIS — M86371 Chronic multifocal osteomyelitis, right ankle and foot: Secondary | ICD-10-CM

## 2024-08-10 DIAGNOSIS — E119 Type 2 diabetes mellitus without complications: Secondary | ICD-10-CM | POA: Diagnosis not present

## 2024-08-10 DIAGNOSIS — M86171 Other acute osteomyelitis, right ankle and foot: Secondary | ICD-10-CM | POA: Diagnosis not present

## 2024-08-10 DIAGNOSIS — Z794 Long term (current) use of insulin: Secondary | ICD-10-CM

## 2024-08-10 LAB — LAB REPORT - SCANNED: EGFR: 112

## 2024-08-10 NOTE — Telephone Encounter (Signed)
 Per Dr. Fleeta Rothman picc can be pulled after last dose 11/20. Community message sent to [employer] with orders. Lorenda CHRISTELLA Code, RMA

## 2024-08-10 NOTE — Progress Notes (Signed)
 Subjective:  Chief Complaint: follow-up for osteomyelitis   Patient ID: [name], female    DOB: June 02, 1966, 58 y.o.   MRN: [id number]  HPI  Discussed the use of AI scribe software for clinical note transcription with the patient, who gave verbal consent to proceed.  History of Present Illness   [name] is a 58 year old female with diabetes mellitus and neuropathy who presents with a chronic diabetic foot ulcer with calcaneal osteomyelitis.  She developed a diabetic foot ulcer following a steroid injection for plantar fasciitis. The ulcer has been present since 2002 and has been complicated by multiple organisms isolated from swabs and deeper cultures. An MRI on June 12, 2024, showed a chronic plantar heel ulcer extending into the inferior calcaneal tuberosity with subcortical irregularity and bone marrow edema. This finding was also noted in imaging from September 2023.  She was seen in the clinic on July 07, 2024, after a previous consultation in 2023. She has been on a course of IV antibiotics, including daptomycin and meropenem,. Her inflammatory markers, including sed rate and CRP, have shown some fluctuation.  She is currently receiving wound care but not seeing podiatry. or orthopedics      Past Medical History:  Diagnosis Date   Acute osteomyelitis of calcaneum, right (HCC) 07/06/2024   Back pain    Cellulitis    Chronic GERD    Chronic multifocal osteomyelitis of right foot (HCC) 07/06/2024   Diabetes mellitus without complication (HCC)    Hypertension     Past Surgical History:  Procedure Laterality Date   AMPUTATION Right 02/01/2021   Procedure: PARTIAL OR COMPLETE AMPUTATION RIGHT 2ND TOE;  Surgeon: Blinda Katz, DPM;  Location: [facility];  Service: Podiatry;  Laterality: Right;   CHOLECYSTECTOMY     ENDOMETRIAL ABLATION     IRRIGATION AND DEBRIDEMENT FOOT Right 02/01/2021   Procedure: DEBRIDEMENT OF ULCERATION RIGHT HEEL;  Surgeon: Blinda Katz,  DPM;  Location: [facility];  Service: Podiatry;  Laterality: Right;   ORIF ANKLE FRACTURE Right    THYROID  SURGERY Left    WOUND DEBRIDEMENT Bilateral 02/01/2021   Procedure: DEBRIDEMENT OF NAILS OF BOTH FEET AND DEBRIDEMENT OF A CALLUS OF THE LEFT FOOT;  Surgeon: Blinda Katz, DPM;  Location: [facility];  Service: Podiatry;  Laterality: Bilateral;    No family history on file.    Social History   Socioeconomic History   Marital status: Married    Spouse name: Not on file   Number of children: Not on file   Years of education: Not on file   Highest education level: Not on file  Occupational History   Not on file  Tobacco Use   Smoking status: Never   Smokeless tobacco: Never  Vaping Use   Vaping status: Never Used  Substance and Sexual Activity   Alcohol use: Never   Drug use: Never   Sexual activity: Not on file  Other Topics Concern   Not on file  Social History Narrative   ** Merged History Encounter **       Social Drivers of Health   Financial Resource Strain: Low Risk  (12/13/2023)   Received from [facility]   Overall Financial Resource Strain (CARDIA)    Difficulty of Paying Living Expenses: Not hard at all  Food Insecurity: No Food Insecurity (12/13/2023)   Received from [facility]   Hunger Vital Sign    Within the past 12 months, you worried that your food would run  out before you got the money to buy more.: Never true    Within the past 12 months, the food you bought just didn't last and you didn't have money to get more.: Never true  Transportation Needs: No Transportation Needs (12/13/2023)   Received from [facility] - Transportation    Lack of Transportation (Medical): No    Lack of Transportation (Non-Medical): No  Physical Activity: Inactive (03/31/2019)   Received from [facility]   Exercise Vital Sign    On average, how many days per week do you engage in moderate to strenuous exercise (like a brisk walk)?: 0 days    On  average, how many minutes do you engage in exercise at this level?: 0 min  Stress: Not on file  Social Connections: Not on file    No Known Allergies   Current Outpatient Medications:    amoxicillin -clavulanate (AUGMENTIN ) 875-125 MG tablet, Take 1 tablet by mouth every 12 (twelve) hours., Disp: 14 tablet, Rfl: 0   ARIPiprazole  (ABILIFY ) 5 MG tablet, Take 5 mg by mouth daily., Disp: , Rfl:    atorvastatin (LIPITOR) 20 MG tablet, Take 20 mg by mouth daily., Disp: , Rfl:    cyclobenzaprine (FLEXERIL) 10 MG tablet, Take 10 mg by mouth 2 (two) times daily., Disp: , Rfl:    escitalopram  (LEXAPRO ) 20 MG tablet, Take 1 tablet by mouth daily., Disp: , Rfl:    estradiol  (ESTRACE ) 0.1 MG/GM vaginal cream, Discard plastic applicator. Insert a blueberry size amount (approximately 1 gram) of cream on fingertip inside vagina at bedtime every night for 1 week then every other night. For long term use., Disp: 30 g, Rfl: 3   JARDIANCE 25 MG TABS tablet, Take 25 mg by mouth daily., Disp: , Rfl:    ketoconazole (NIZORAL) 2 % shampoo, Apply 1 application topically 2 (two) times a week., Disp: , Rfl:    lidocaine  (LINDAMANTLE) 3 % CREA cream, Apply to vulvovaginal area as needed; can be done every 8 hours. Use 15 minutes prior to applying topical vaginal estrogen cream if that stings when used., Disp: 28 g, Rfl: 1   lisinopril (ZESTRIL) 20 MG tablet, Take 1 tablet by mouth daily., Disp: , Rfl:    Methen-Hyosc-Meth Blue-Na Phos (UROGESIC-BLUE) 81.6 MG TABS, Take 1 tablet (81.6 mg total) by mouth every 6 (six) hours as needed., Disp: 120 tablet, Rfl: PRN   Multiple Vitamin (MULTI-VITAMIN) tablet, Take 1 tablet by mouth daily., Disp: , Rfl:    nystatin  (MYCOSTATIN /NYSTOP ) powder, Apply topically 2 (two) times daily. to affected area(s), Disp: , Rfl:    nystatin -triamcinolone  (MYCOLOG II) cream, Apply sparingly to affected area(s) twice daily. Therapy should be discontinued when control is achieved or if symptoms  persist for >25 days of therapy., Disp: 30 g, Rfl: 1   Omega-3 1000 MG CAPS, Take 2 capsules by mouth daily., Disp: , Rfl:    pantoprazole  (PROTONIX ) 40 MG tablet, Take 1 tablet by mouth daily., Disp: , Rfl:    triamcinolone  lotion (KENALOG) 0.1 %, Apply 1 application topically daily., Disp: , Rfl:    Review of Systems  Constitutional:  Negative for activity change, appetite change, chills, diaphoresis, fatigue, fever and unexpected weight change.  HENT:  Negative for congestion, rhinorrhea, sinus pressure, sneezing, sore throat and trouble swallowing.   Eyes:  Negative for photophobia and visual disturbance.  Respiratory:  Negative for cough, chest tightness, shortness of breath, wheezing and stridor.   Cardiovascular:  Negative for chest  pain, palpitations and leg swelling.  Gastrointestinal:  Negative for abdominal distention, abdominal pain, anal bleeding, blood in stool, constipation, diarrhea, nausea and vomiting.  Genitourinary:  Negative for difficulty urinating, dysuria, flank pain and hematuria.  Musculoskeletal:  Negative for arthralgias, back pain, gait problem, joint swelling and myalgias.  Skin:  Positive for wound. Negative for color change, pallor and rash.  Neurological:  Negative for dizziness, tremors, weakness and light-headedness.  Hematological:  Negative for adenopathy. Does not bruise/bleed easily.  Psychiatric/Behavioral:  Negative for agitation, behavioral problems, confusion, decreased concentration, dysphoric mood and sleep disturbance.        Objective:   Physical Exam Constitutional:      General: She is not in acute distress.    Appearance: Normal appearance. She is well-developed. She is not ill-appearing or diaphoretic.  HENT:     Head: Normocephalic and atraumatic.     Right Ear: Hearing and external ear normal.     Left Ear: Hearing and external ear normal.     Nose: No nasal deformity or rhinorrhea.  Eyes:     General: No scleral icterus.     Conjunctiva/sclera: Conjunctivae normal.     Right eye: Right conjunctiva is not injected.     Left eye: Left conjunctiva is not injected.     Pupils: Pupils are equal, round, and reactive to light.  Neck:     Vascular: No JVD.  Cardiovascular:     Rate and Rhythm: Normal rate and regular rhythm.     Heart sounds: S1 normal and S2 normal.  Pulmonary:     Effort: Pulmonary effort is normal. No respiratory distress.     Breath sounds: No wheezing.  Abdominal:     General: Bowel sounds are normal. There is no distension.     Palpations: Abdomen is soft.     Tenderness: There is no abdominal tenderness.  Musculoskeletal:     Right shoulder: Normal.     Left shoulder: Normal.     Cervical back: Normal range of motion and neck supple.     Right hip: Normal.     Left hip: Normal.     Right knee: Normal.     Left knee: Normal.  Lymphadenopathy:     Head:     Right side of head: No submandibular, preauricular or posterior auricular adenopathy.     Left side of head: No submandibular, preauricular or posterior auricular adenopathy.     Cervical: No cervical adenopathy.     Right cervical: No superficial or deep cervical adenopathy.    Left cervical: No superficial or deep cervical adenopathy.  Skin:    General: Skin is warm and dry.     Coloration: Skin is not pale.     Findings: No abrasion, bruising, ecchymosis, erythema, lesion or rash.     Nails: There is no clubbing.  Neurological:     Mental Status: She is alert and oriented to person, place, and time.     Sensory: No sensory deficit.     Coordination: Coordination normal.     Gait: Gait normal.  Psychiatric:        Attention and Perception: She is attentive.        Speech: Speech normal.        Behavior: Behavior normal. Behavior is cooperative.        Thought Content: Thought content normal.        Judgment: Judgment normal.      Foot 07/2024  Foot 08/10/2024    PICC         Assessment & Plan:    Assessment and Plan    Chronic right calcaneal osteomyelitis with diabetic foot ulcer in the setting of type 2 diabetes mellitus and neuropathy Chronic osteomyelitis with diabetic foot ulcer since 2002. MRI confirmed chronic plantar heel ulcer with bone involvement. Antibiotics will not cure; below-the-knee amputation would be required but she is very much against thsi.  recommended but declined. Current IV antibiotics improving ulcer appearance, bone infection persists  - Continue IV antibiotics until August 26, 2024. dc PICC after completion of IV abx - Continue wound care follow-up. - Schedule follow-up in February 2026.      I have personally spent 26 minutes involved in face-to-face and non-face-to-face activities for this patient on the day of the visit. Professional time spent includes the following activities: Preparing to see the patient (review of tests), Obtaining and/or reviewing separately obtained history (admission/discharge record), Performing a medically appropriate examination and/or evaluation , Ordering medications/tests/procedures, referring and communicating with other health care professionals, Documenting clinical information in the EMR, Independently interpreting results (not separately reported), Communicating results to the patient/family/caregiver, Counseling and educating the patient/family/caregiver and Care coordination (not separately reported).

## 2024-08-16 ENCOUNTER — Encounter (HOSPITAL_BASED_OUTPATIENT_CLINIC_OR_DEPARTMENT_OTHER): Attending: Internal Medicine | Admitting: Internal Medicine

## 2024-08-16 DIAGNOSIS — L97418 Non-pressure chronic ulcer of right heel and midfoot with other specified severity: Secondary | ICD-10-CM | POA: Diagnosis not present

## 2024-08-16 DIAGNOSIS — E11621 Type 2 diabetes mellitus with foot ulcer: Secondary | ICD-10-CM | POA: Diagnosis not present

## 2024-08-16 DIAGNOSIS — I890 Lymphedema, not elsewhere classified: Secondary | ICD-10-CM

## 2024-08-18 LAB — LAB REPORT - SCANNED: EGFR: 117

## 2024-08-24 ENCOUNTER — Encounter: Payer: Self-pay | Admitting: Infectious Disease

## 2024-08-25 LAB — LAB REPORT - SCANNED: EGFR: 112

## 2024-09-06 ENCOUNTER — Encounter (HOSPITAL_BASED_OUTPATIENT_CLINIC_OR_DEPARTMENT_OTHER): Attending: Internal Medicine | Admitting: Internal Medicine

## 2024-09-06 DIAGNOSIS — E11621 Type 2 diabetes mellitus with foot ulcer: Secondary | ICD-10-CM | POA: Insufficient documentation

## 2024-09-06 DIAGNOSIS — L97418 Non-pressure chronic ulcer of right heel and midfoot with other specified severity: Secondary | ICD-10-CM | POA: Insufficient documentation

## 2024-09-06 DIAGNOSIS — I890 Lymphedema, not elsewhere classified: Secondary | ICD-10-CM | POA: Diagnosis not present

## 2024-09-20 ENCOUNTER — Encounter (HOSPITAL_BASED_OUTPATIENT_CLINIC_OR_DEPARTMENT_OTHER): Admitting: Internal Medicine

## 2024-10-04 ENCOUNTER — Encounter (HOSPITAL_BASED_OUTPATIENT_CLINIC_OR_DEPARTMENT_OTHER): Admitting: Internal Medicine

## 2024-10-04 DIAGNOSIS — I890 Lymphedema, not elsewhere classified: Secondary | ICD-10-CM

## 2024-10-04 DIAGNOSIS — E11621 Type 2 diabetes mellitus with foot ulcer: Secondary | ICD-10-CM | POA: Diagnosis not present

## 2024-10-04 DIAGNOSIS — L97418 Non-pressure chronic ulcer of right heel and midfoot with other specified severity: Secondary | ICD-10-CM

## 2024-10-18 ENCOUNTER — Encounter (HOSPITAL_BASED_OUTPATIENT_CLINIC_OR_DEPARTMENT_OTHER): Admitting: Internal Medicine

## 2024-10-26 ENCOUNTER — Encounter (HOSPITAL_BASED_OUTPATIENT_CLINIC_OR_DEPARTMENT_OTHER): Attending: Internal Medicine | Admitting: Internal Medicine

## 2024-10-26 DIAGNOSIS — I890 Lymphedema, not elsewhere classified: Secondary | ICD-10-CM | POA: Insufficient documentation

## 2024-10-26 DIAGNOSIS — E11621 Type 2 diabetes mellitus with foot ulcer: Secondary | ICD-10-CM | POA: Diagnosis not present

## 2024-10-26 DIAGNOSIS — L97418 Non-pressure chronic ulcer of right heel and midfoot with other specified severity: Secondary | ICD-10-CM | POA: Insufficient documentation

## 2024-11-01 ENCOUNTER — Encounter (HOSPITAL_BASED_OUTPATIENT_CLINIC_OR_DEPARTMENT_OTHER): Admitting: Internal Medicine

## 2024-11-09 ENCOUNTER — Encounter (HOSPITAL_BASED_OUTPATIENT_CLINIC_OR_DEPARTMENT_OTHER): Admitting: Internal Medicine

## 2024-11-15 ENCOUNTER — Ambulatory Visit: Admitting: Infectious Disease

## 2024-11-23 ENCOUNTER — Encounter (HOSPITAL_BASED_OUTPATIENT_CLINIC_OR_DEPARTMENT_OTHER): Admitting: Internal Medicine
